# Patient Record
Sex: Female | Born: 1935 | Race: White | Hispanic: No | Smoking: Former smoker
Health system: Southern US, Community
[De-identification: ages and names within clinical notes are randomized; demographics above are authoritative.]

## PROBLEM LIST (undated history)

## (undated) DIAGNOSIS — E785 Hyperlipidemia, unspecified: Secondary | ICD-10-CM

## (undated) DIAGNOSIS — K5730 Diverticulosis of large intestine without perforation or abscess without bleeding: Secondary | ICD-10-CM

## (undated) DIAGNOSIS — D638 Anemia in other chronic diseases classified elsewhere: Secondary | ICD-10-CM

## (undated) DIAGNOSIS — M545 Low back pain, unspecified: Secondary | ICD-10-CM

## (undated) DIAGNOSIS — E1140 Type 2 diabetes mellitus with diabetic neuropathy, unspecified: Secondary | ICD-10-CM

## (undated) DIAGNOSIS — K8020 Calculus of gallbladder without cholecystitis without obstruction: Secondary | ICD-10-CM

## (undated) DIAGNOSIS — R51 Headache: Secondary | ICD-10-CM

## (undated) DIAGNOSIS — E039 Hypothyroidism, unspecified: Secondary | ICD-10-CM

## (undated) DIAGNOSIS — E538 Deficiency of other specified B group vitamins: Secondary | ICD-10-CM

## (undated) DIAGNOSIS — N259 Disorder resulting from impaired renal tubular function, unspecified: Secondary | ICD-10-CM

## (undated) DIAGNOSIS — J209 Acute bronchitis, unspecified: Secondary | ICD-10-CM

## (undated) DIAGNOSIS — K219 Gastro-esophageal reflux disease without esophagitis: Secondary | ICD-10-CM

## (undated) DIAGNOSIS — F411 Generalized anxiety disorder: Secondary | ICD-10-CM

## (undated) DIAGNOSIS — M159 Polyosteoarthritis, unspecified: Secondary | ICD-10-CM

## (undated) DIAGNOSIS — I10 Essential (primary) hypertension: Secondary | ICD-10-CM

## (undated) DIAGNOSIS — R079 Chest pain, unspecified: Secondary | ICD-10-CM

## (undated) DIAGNOSIS — E119 Type 2 diabetes mellitus without complications: Secondary | ICD-10-CM

## (undated) HISTORY — DX: Gastro-esophageal reflux disease without esophagitis: K21.9

## (undated) HISTORY — DX: Chest pain, unspecified: R07.9

## (undated) HISTORY — DX: Calculus of gallbladder without cholecystitis without obstruction: K80.20

## (undated) HISTORY — DX: Diverticulosis of large intestine without perforation or abscess without bleeding: K57.30

## (undated) HISTORY — DX: Deficiency of other specified B group vitamins: E53.8

## (undated) HISTORY — DX: Polyosteoarthritis, unspecified: M15.9

## (undated) HISTORY — DX: Headache: R51

## (undated) HISTORY — DX: Hyperlipidemia, unspecified: E78.5

## (undated) HISTORY — DX: Low back pain: M54.5

## (undated) HISTORY — PX: APPENDECTOMY: SHX54

## (undated) HISTORY — DX: Type 2 diabetes mellitus without complications: E11.9

## (undated) HISTORY — DX: Low back pain, unspecified: M54.50

## (undated) HISTORY — DX: Essential (primary) hypertension: I10

## (undated) HISTORY — DX: Hypothyroidism, unspecified: E03.9

## (undated) HISTORY — DX: Generalized anxiety disorder: F41.1

## (undated) HISTORY — DX: Anemia in other chronic diseases classified elsewhere: D63.8

## (undated) HISTORY — DX: Disorder resulting from impaired renal tubular function, unspecified: N25.9

## (undated) HISTORY — DX: Acute bronchitis, unspecified: J20.9

## (undated) HISTORY — PX: THYROIDECTOMY: SHX17

---

## 1997-05-23 ENCOUNTER — Encounter: Admission: RE | Admit: 1997-05-23 | Discharge: 1997-08-21 | Payer: Self-pay | Admitting: *Deleted

## 1998-03-22 ENCOUNTER — Emergency Department (HOSPITAL_COMMUNITY): Admission: EM | Admit: 1998-03-22 | Discharge: 1998-03-22 | Payer: Self-pay | Admitting: Emergency Medicine

## 1998-03-22 ENCOUNTER — Encounter: Payer: Self-pay | Admitting: Emergency Medicine

## 1998-04-13 ENCOUNTER — Other Ambulatory Visit: Admission: RE | Admit: 1998-04-13 | Discharge: 1998-04-13 | Payer: Self-pay | Admitting: *Deleted

## 1998-06-26 ENCOUNTER — Ambulatory Visit (HOSPITAL_COMMUNITY): Admission: RE | Admit: 1998-06-26 | Discharge: 1998-06-26 | Payer: Self-pay | Admitting: Internal Medicine

## 1999-06-05 ENCOUNTER — Other Ambulatory Visit: Admission: RE | Admit: 1999-06-05 | Discharge: 1999-06-05 | Payer: Self-pay | Admitting: *Deleted

## 2000-11-27 ENCOUNTER — Ambulatory Visit (HOSPITAL_COMMUNITY): Admission: RE | Admit: 2000-11-27 | Discharge: 2000-11-27 | Payer: Self-pay | Admitting: Pulmonary Disease

## 2000-11-27 ENCOUNTER — Encounter: Payer: Self-pay | Admitting: Pulmonary Disease

## 2000-12-17 ENCOUNTER — Encounter: Payer: Self-pay | Admitting: Rheumatology

## 2000-12-17 ENCOUNTER — Encounter: Admission: RE | Admit: 2000-12-17 | Discharge: 2000-12-17 | Payer: Self-pay | Admitting: Rheumatology

## 2001-01-20 ENCOUNTER — Encounter: Admission: RE | Admit: 2001-01-20 | Discharge: 2001-02-17 | Payer: Self-pay | Admitting: Rheumatology

## 2004-03-16 ENCOUNTER — Emergency Department (HOSPITAL_COMMUNITY): Admission: EM | Admit: 2004-03-16 | Discharge: 2004-03-17 | Payer: Self-pay | Admitting: Emergency Medicine

## 2004-04-02 ENCOUNTER — Ambulatory Visit: Payer: Self-pay | Admitting: Pulmonary Disease

## 2004-04-03 ENCOUNTER — Ambulatory Visit: Payer: Self-pay | Admitting: Family Medicine

## 2004-04-10 ENCOUNTER — Ambulatory Visit: Payer: Self-pay | Admitting: Pulmonary Disease

## 2004-04-19 ENCOUNTER — Ambulatory Visit: Payer: Self-pay | Admitting: Pulmonary Disease

## 2004-04-19 ENCOUNTER — Ambulatory Visit: Payer: Self-pay | Admitting: Internal Medicine

## 2004-05-03 ENCOUNTER — Ambulatory Visit: Payer: Self-pay | Admitting: Internal Medicine

## 2004-05-15 ENCOUNTER — Ambulatory Visit: Payer: Self-pay | Admitting: Pulmonary Disease

## 2004-05-29 ENCOUNTER — Ambulatory Visit: Payer: Self-pay | Admitting: Pulmonary Disease

## 2004-07-01 ENCOUNTER — Ambulatory Visit: Payer: Self-pay | Admitting: Pulmonary Disease

## 2004-09-17 ENCOUNTER — Ambulatory Visit: Payer: Self-pay | Admitting: Pulmonary Disease

## 2004-12-18 ENCOUNTER — Ambulatory Visit: Payer: Self-pay | Admitting: Pulmonary Disease

## 2005-04-11 ENCOUNTER — Ambulatory Visit: Payer: Self-pay | Admitting: Pulmonary Disease

## 2005-04-28 ENCOUNTER — Ambulatory Visit: Payer: Self-pay | Admitting: Cardiology

## 2005-05-09 ENCOUNTER — Ambulatory Visit: Payer: Self-pay

## 2005-05-14 ENCOUNTER — Ambulatory Visit: Payer: Self-pay | Admitting: Cardiology

## 2005-06-05 ENCOUNTER — Encounter: Admission: RE | Admit: 2005-06-05 | Discharge: 2005-06-20 | Payer: Self-pay | Admitting: Physician Assistant

## 2005-06-25 ENCOUNTER — Ambulatory Visit: Payer: Self-pay | Admitting: Pulmonary Disease

## 2005-08-11 ENCOUNTER — Ambulatory Visit: Payer: Self-pay | Admitting: Pulmonary Disease

## 2005-11-11 ENCOUNTER — Ambulatory Visit: Payer: Self-pay | Admitting: Pulmonary Disease

## 2005-11-27 ENCOUNTER — Ambulatory Visit: Payer: Self-pay | Admitting: Pulmonary Disease

## 2005-11-27 LAB — CONVERTED CEMR LAB
ALT: 15 units/L (ref 0–40)
AST: 22 units/L (ref 0–37)
Albumin: 4 g/dL (ref 3.5–5.2)
Alkaline Phosphatase: 43 units/L (ref 39–117)
Basophils Relative: 0.6 % (ref 0.0–1.0)
Chol/HDL Ratio, serum: 9.1
Cholesterol: 182 mg/dL (ref 0–200)
Eosinophil percent: 3.2 % (ref 0.0–5.0)
GFR calc non Af Amer: 26 mL/min
Glomerular Filtration Rate, Af Am: 31 mL/min/{1.73_m2}
Glucose, Bld: 59 mg/dL — ABNORMAL LOW (ref 70–99)
HCT: 32.4 % — ABNORMAL LOW (ref 36.0–46.0)
Hgb A1c MFr Bld: 7.3 % — ABNORMAL HIGH (ref 4.6–6.0)
MCV: 91.1 fL (ref 78.0–100.0)
Monocytes Absolute: 0.5 10*3/uL (ref 0.2–0.7)
Neutro Abs: 2.3 10*3/uL (ref 1.4–7.7)
Platelets: 244 10*3/uL (ref 150–400)
RBC: 3.56 M/uL — ABNORMAL LOW (ref 3.87–5.11)
RDW: 13.2 % (ref 11.5–14.6)
Sodium: 142 meq/L (ref 135–145)
Total Bilirubin: 0.7 mg/dL (ref 0.3–1.2)
VLDL: 58 mg/dL — ABNORMAL HIGH (ref 0–40)
WBC: 4.5 10*3/uL (ref 4.5–10.5)

## 2006-02-16 ENCOUNTER — Ambulatory Visit: Payer: Self-pay | Admitting: Pulmonary Disease

## 2006-02-16 ENCOUNTER — Observation Stay (HOSPITAL_COMMUNITY): Admission: EM | Admit: 2006-02-16 | Discharge: 2006-02-19 | Payer: Self-pay | Admitting: Emergency Medicine

## 2006-03-04 ENCOUNTER — Ambulatory Visit: Payer: Self-pay | Admitting: Pulmonary Disease

## 2006-03-04 LAB — CONVERTED CEMR LAB
Albumin: 4.3 g/dL (ref 3.5–5.2)
Alkaline Phosphatase: 36 units/L — ABNORMAL LOW (ref 39–117)
BUN: 37 mg/dL — ABNORMAL HIGH (ref 6–23)
Basophils Absolute: 0.1 10*3/uL (ref 0.0–0.1)
CO2: 23 meq/L (ref 19–32)
Calcium: 9.8 mg/dL (ref 8.4–10.5)
Creatinine, Ser: 1.8 mg/dL — ABNORMAL HIGH (ref 0.4–1.2)
Eosinophils Absolute: 0.1 10*3/uL (ref 0.0–0.6)
Glucose, Bld: 137 mg/dL — ABNORMAL HIGH (ref 70–99)
Hemoglobin: 11.1 g/dL — ABNORMAL LOW (ref 12.0–15.0)
Lymphocytes Relative: 22.9 % (ref 12.0–46.0)
MCHC: 34.6 g/dL (ref 30.0–36.0)
Monocytes Absolute: 0.7 10*3/uL (ref 0.2–0.7)
Monocytes Relative: 10.6 % (ref 3.0–11.0)
Neutro Abs: 4.3 10*3/uL (ref 1.4–7.7)
Neutrophils Relative %: 63.5 % (ref 43.0–77.0)
Platelets: 341 10*3/uL (ref 150–400)
TSH: 0.41 microintl units/mL (ref 0.35–5.50)

## 2006-04-13 ENCOUNTER — Ambulatory Visit: Payer: Self-pay | Admitting: Pulmonary Disease

## 2006-06-24 ENCOUNTER — Ambulatory Visit: Payer: Self-pay | Admitting: Pulmonary Disease

## 2006-06-24 LAB — CONVERTED CEMR LAB
CO2: 25 meq/L (ref 19–32)
Chloride: 113 meq/L — ABNORMAL HIGH (ref 96–112)
Creatinine, Ser: 1.6 mg/dL — ABNORMAL HIGH (ref 0.4–1.2)
GFR calc non Af Amer: 34 mL/min
Glucose, Bld: 75 mg/dL (ref 70–99)
Sodium: 143 meq/L (ref 135–145)

## 2006-07-17 ENCOUNTER — Ambulatory Visit: Payer: Self-pay | Admitting: Pulmonary Disease

## 2006-09-22 ENCOUNTER — Ambulatory Visit: Payer: Self-pay | Admitting: Pulmonary Disease

## 2006-09-23 ENCOUNTER — Ambulatory Visit: Payer: Self-pay | Admitting: Pulmonary Disease

## 2006-09-23 LAB — CONVERTED CEMR LAB
BUN: 26 mg/dL — ABNORMAL HIGH (ref 6–23)
Basophils Relative: 0.9 % (ref 0.0–1.0)
CO2: 26 meq/L (ref 19–32)
Calcium: 9.7 mg/dL (ref 8.4–10.5)
Eosinophils Absolute: 0.1 10*3/uL (ref 0.0–0.6)
GFR calc Af Amer: 44 mL/min
GFR calc non Af Amer: 36 mL/min
HDL: 16.9 mg/dL — ABNORMAL LOW (ref >39.0)
LDL Cholesterol: 73 mg/dL (ref 0–99)
Lymphocytes Relative: 29.2 % (ref 12.0–46.0)
Monocytes Relative: 14 % — ABNORMAL HIGH (ref 3.0–11.0)
Neutro Abs: 2.5 10*3/uL (ref 1.4–7.7)
Platelets: 281 10*3/uL (ref 150–400)
RBC: 3.18 M/uL — ABNORMAL LOW (ref 3.87–5.11)
Total CHOL/HDL Ratio: 7.6
Triglycerides: 197 mg/dL — ABNORMAL HIGH (ref 0–149)
VLDL: 39 mg/dL (ref 0–40)

## 2006-11-21 DIAGNOSIS — K219 Gastro-esophageal reflux disease without esophagitis: Secondary | ICD-10-CM | POA: Insufficient documentation

## 2006-11-21 DIAGNOSIS — R519 Headache, unspecified: Secondary | ICD-10-CM | POA: Insufficient documentation

## 2006-11-21 DIAGNOSIS — G8929 Other chronic pain: Secondary | ICD-10-CM

## 2006-11-21 DIAGNOSIS — M545 Low back pain: Secondary | ICD-10-CM

## 2006-11-21 DIAGNOSIS — E782 Mixed hyperlipidemia: Secondary | ICD-10-CM | POA: Insufficient documentation

## 2006-11-21 DIAGNOSIS — I10 Essential (primary) hypertension: Secondary | ICD-10-CM

## 2006-11-21 DIAGNOSIS — M159 Polyosteoarthritis, unspecified: Secondary | ICD-10-CM

## 2006-11-21 DIAGNOSIS — M810 Age-related osteoporosis without current pathological fracture: Secondary | ICD-10-CM

## 2006-11-21 DIAGNOSIS — E039 Hypothyroidism, unspecified: Secondary | ICD-10-CM

## 2006-11-21 DIAGNOSIS — R51 Headache: Secondary | ICD-10-CM

## 2006-11-21 DIAGNOSIS — E785 Hyperlipidemia, unspecified: Secondary | ICD-10-CM

## 2007-01-21 ENCOUNTER — Ambulatory Visit: Payer: Self-pay | Admitting: Pulmonary Disease

## 2007-01-21 DIAGNOSIS — F411 Generalized anxiety disorder: Secondary | ICD-10-CM | POA: Insufficient documentation

## 2007-01-21 DIAGNOSIS — N184 Chronic kidney disease, stage 4 (severe): Secondary | ICD-10-CM | POA: Insufficient documentation

## 2007-01-21 DIAGNOSIS — N183 Chronic kidney disease, stage 3 (moderate): Secondary | ICD-10-CM

## 2007-01-21 DIAGNOSIS — K5730 Diverticulosis of large intestine without perforation or abscess without bleeding: Secondary | ICD-10-CM | POA: Insufficient documentation

## 2007-01-21 DIAGNOSIS — D638 Anemia in other chronic diseases classified elsewhere: Secondary | ICD-10-CM | POA: Insufficient documentation

## 2007-03-08 ENCOUNTER — Telehealth: Payer: Self-pay | Admitting: Pulmonary Disease

## 2007-05-24 ENCOUNTER — Ambulatory Visit: Payer: Self-pay | Admitting: Pulmonary Disease

## 2007-05-24 ENCOUNTER — Encounter: Payer: Self-pay | Admitting: Adult Health

## 2007-06-04 ENCOUNTER — Telehealth: Payer: Self-pay | Admitting: Pulmonary Disease

## 2007-06-05 LAB — CONVERTED CEMR LAB: Vit D, 1,25-Dihydroxy: 24 — ABNORMAL LOW (ref 30–89)

## 2007-06-06 LAB — CONVERTED CEMR LAB
ALT: 14 units/L (ref 0–35)
Calcium: 10.1 mg/dL (ref 8.4–10.5)
Cholesterol: 127 mg/dL (ref 0–200)
Creatinine, Ser: 1.5 mg/dL — ABNORMAL HIGH (ref 0.4–1.2)
Direct LDL: 69.3 mg/dL
Eosinophils Absolute: 0.2 10*3/uL (ref 0.0–0.7)
Folate: 9.6 ng/mL
GFR calc non Af Amer: 36 mL/min
HCT: 28.7 % — ABNORMAL LOW (ref 36.0–46.0)
Hgb A1c MFr Bld: 5.5 % (ref 4.6–6.0)
Iron: 102 ug/dL (ref 42–145)
MCV: 90.3 fL (ref 78.0–100.0)
Monocytes Absolute: 0.5 10*3/uL (ref 0.1–1.0)
Platelets: 250 10*3/uL (ref 150–400)
RDW: 14.6 % (ref 11.5–14.6)
Total Bilirubin: 0.9 mg/dL (ref 0.3–1.2)
Transferrin: 343.7 mg/dL (ref >=212.0)
Triglycerides: 269 mg/dL (ref 0–149)
Vitamin B-12: >1500 pg/mL — ABNORMAL HIGH (ref 211–911)

## 2007-06-08 ENCOUNTER — Telehealth: Payer: Self-pay | Admitting: Pulmonary Disease

## 2007-06-15 ENCOUNTER — Telehealth: Payer: Self-pay | Admitting: Pulmonary Disease

## 2007-11-23 ENCOUNTER — Ambulatory Visit: Payer: Self-pay | Admitting: Pulmonary Disease

## 2007-11-23 DIAGNOSIS — E538 Deficiency of other specified B group vitamins: Secondary | ICD-10-CM | POA: Insufficient documentation

## 2007-11-25 ENCOUNTER — Ambulatory Visit: Payer: Self-pay | Admitting: Pulmonary Disease

## 2007-11-26 ENCOUNTER — Encounter: Payer: Self-pay | Admitting: Pulmonary Disease

## 2007-11-26 LAB — CONVERTED CEMR LAB
ALT: 16 units/L (ref 0–35)
AST: 25 units/L (ref 0–37)
Albumin: 3.7 g/dL (ref 3.5–5.2)
Alkaline Phosphatase: 31 units/L — ABNORMAL LOW (ref 39–117)
BUN: 31 mg/dL — ABNORMAL HIGH (ref 6–23)
CO2: 24 meq/L (ref 19–32)
Chloride: 110 meq/L (ref 96–112)
Eosinophils Relative: 5.1 % — ABNORMAL HIGH (ref 0.0–5.0)
Glucose, Bld: 92 mg/dL (ref 70–99)
HCT: 26.7 % — ABNORMAL LOW (ref 36.0–46.0)
Lymphocytes Relative: 34.1 % (ref 12.0–46.0)
Monocytes Absolute: 0.7 10*3/uL (ref 0.1–1.0)
Monocytes Relative: 18.4 % — ABNORMAL HIGH (ref 3.0–12.0)
Neutrophils Relative %: 40.9 % — ABNORMAL LOW (ref 43.0–77.0)
Platelets: 211 10*3/uL (ref 150–400)
Potassium: 5.4 meq/L — ABNORMAL HIGH (ref 3.5–5.1)
Total CHOL/HDL Ratio: 12
Total Protein: 6.8 g/dL (ref 6.0–8.3)
Triglycerides: 247 mg/dL (ref 0–149)
WBC: 3.7 10*3/uL — ABNORMAL LOW (ref 4.5–10.5)

## 2007-12-28 ENCOUNTER — Telehealth (INDEPENDENT_AMBULATORY_CARE_PROVIDER_SITE_OTHER): Payer: Self-pay | Admitting: *Deleted

## 2008-01-31 ENCOUNTER — Telehealth: Payer: Self-pay | Admitting: Pulmonary Disease

## 2008-02-18 ENCOUNTER — Ambulatory Visit: Payer: Self-pay | Admitting: Internal Medicine

## 2008-02-21 ENCOUNTER — Encounter: Payer: Self-pay | Admitting: Pulmonary Disease

## 2008-02-22 ENCOUNTER — Telehealth (INDEPENDENT_AMBULATORY_CARE_PROVIDER_SITE_OTHER): Payer: Self-pay | Admitting: *Deleted

## 2008-02-22 LAB — CONVERTED CEMR LAB
CO2: 26 meq/L (ref 19–32)
Calcium: 9.4 mg/dL (ref 8.4–10.5)
Chloride: 108 meq/L (ref 96–112)
Creatinine, Ser: 0.9 mg/dL (ref 0.4–1.2)
Glucose, Bld: 115 mg/dL — ABNORMAL HIGH (ref 70–99)

## 2008-03-06 ENCOUNTER — Ambulatory Visit: Payer: Self-pay | Admitting: Pulmonary Disease

## 2008-03-24 ENCOUNTER — Ambulatory Visit: Payer: Self-pay | Admitting: Pulmonary Disease

## 2008-05-23 ENCOUNTER — Ambulatory Visit: Payer: Self-pay | Admitting: Pulmonary Disease

## 2008-05-28 LAB — CONVERTED CEMR LAB
ALT: 15 units/L (ref 0–35)
AST: 23 units/L (ref 0–37)
Basophils Relative: 0.6 % (ref 0.0–3.0)
Bilirubin, Direct: 0.1 mg/dL (ref 0.0–0.3)
Chloride: 110 meq/L (ref 96–112)
Creatinine, Ser: 1 mg/dL (ref 0.4–1.2)
Creatinine,U: 151.9 mg/dL
Eosinophils Relative: 3.1 % (ref 0.0–5.0)
GFR calc non Af Amer: 57.38 mL/min (ref >60)
HCT: 32.6 % — ABNORMAL LOW (ref 36.0–46.0)
HDL: 38.2 mg/dL — ABNORMAL LOW (ref >39.00)
Hgb A1c MFr Bld: 7.1 % — ABNORMAL HIGH (ref 4.6–6.5)
MCV: 88.4 fL (ref 78.0–100.0)
Microalb Creat Ratio: 458.2 mg/g — ABNORMAL HIGH (ref 0.0–30.0)
Microalb, Ur: 69.6 mg/dL — ABNORMAL HIGH (ref 0.0–1.9)
Monocytes Absolute: 0.8 10*3/uL (ref 0.1–1.0)
Monocytes Relative: 12 % (ref 3.0–12.0)
Neutrophils Relative %: 59.3 % (ref 43.0–77.0)
Platelets: 241 10*3/uL (ref 150.0–400.0)
Potassium: 4.6 meq/L (ref 3.5–5.1)
RBC: 3.68 M/uL — ABNORMAL LOW (ref 3.87–5.11)
Saturation Ratios: 19.3 % — ABNORMAL LOW (ref 20.0–50.0)
Total Bilirubin: 0.7 mg/dL (ref 0.3–1.2)
Total CHOL/HDL Ratio: 5
Total Protein: 7.4 g/dL (ref 6.0–8.3)
Transferrin: 262.5 mg/dL (ref 212.0–360.0)
VLDL: 47.8 mg/dL — ABNORMAL HIGH (ref 0.0–40.0)
Vitamin B-12: >1500 pg/mL — ABNORMAL HIGH (ref 211–911)
WBC: 6.7 10*3/uL (ref 4.5–10.5)

## 2008-05-31 ENCOUNTER — Telehealth (INDEPENDENT_AMBULATORY_CARE_PROVIDER_SITE_OTHER): Payer: Self-pay | Admitting: *Deleted

## 2008-10-26 ENCOUNTER — Encounter: Payer: Self-pay | Admitting: Pulmonary Disease

## 2008-10-27 ENCOUNTER — Telehealth: Payer: Self-pay | Admitting: Pulmonary Disease

## 2008-10-30 ENCOUNTER — Ambulatory Visit: Payer: Self-pay | Admitting: Pulmonary Disease

## 2008-10-30 DIAGNOSIS — S139XXA Sprain of joints and ligaments of unspecified parts of neck, initial encounter: Secondary | ICD-10-CM

## 2008-11-08 ENCOUNTER — Encounter: Admission: RE | Admit: 2008-11-08 | Discharge: 2008-11-08 | Payer: Self-pay | Admitting: Neurological Surgery

## 2008-11-21 ENCOUNTER — Encounter: Payer: Self-pay | Admitting: Pulmonary Disease

## 2008-11-22 ENCOUNTER — Ambulatory Visit: Payer: Self-pay | Admitting: Pulmonary Disease

## 2008-12-02 LAB — CONVERTED CEMR LAB
ALT: 14 units/L (ref 0–35)
Albumin: 3.9 g/dL (ref 3.5–5.2)
Alkaline Phosphatase: 39 units/L (ref 39–117)
Basophils Relative: 2 % (ref 0.0–3.0)
Bilirubin, Direct: 0.3 mg/dL (ref 0.0–0.3)
CO2: 25 meq/L (ref 19–32)
Chloride: 108 meq/L (ref 96–112)
Cholesterol: 134 mg/dL (ref 0–200)
Creatinine, Ser: 1.2 mg/dL (ref 0.4–1.2)
Eosinophils Relative: 3.9 % (ref 0.0–5.0)
Hemoglobin: 9.3 g/dL — ABNORMAL LOW (ref 12.0–15.0)
Hgb A1c MFr Bld: 4.8 % (ref 4.6–6.5)
MCHC: 34.9 g/dL (ref 30.0–36.0)
MCV: 92.9 fL (ref 78.0–100.0)
Monocytes Absolute: 0.6 10*3/uL (ref 0.1–1.0)
Neutro Abs: 2 10*3/uL (ref 1.4–7.7)
Neutrophils Relative %: 50 % (ref 43.0–77.0)
Potassium: 4.3 meq/L (ref 3.5–5.1)
RBC: 2.86 M/uL — ABNORMAL LOW (ref 3.87–5.11)
Sodium: 142 meq/L (ref 135–145)
Total CHOL/HDL Ratio: 13
Total Protein: 6.9 g/dL (ref 6.0–8.3)
VLDL: 51.2 mg/dL — ABNORMAL HIGH (ref 0.0–40.0)
WBC: 4.1 10*3/uL — ABNORMAL LOW (ref 4.5–10.5)

## 2009-02-20 ENCOUNTER — Telehealth (INDEPENDENT_AMBULATORY_CARE_PROVIDER_SITE_OTHER): Payer: Self-pay | Admitting: *Deleted

## 2009-04-24 ENCOUNTER — Telehealth (INDEPENDENT_AMBULATORY_CARE_PROVIDER_SITE_OTHER): Payer: Self-pay | Admitting: *Deleted

## 2009-05-14 ENCOUNTER — Ambulatory Visit: Payer: Self-pay | Admitting: Pulmonary Disease

## 2009-05-14 LAB — CONVERTED CEMR LAB: HDL goal, serum: 40 mg/dL

## 2009-05-20 LAB — CONVERTED CEMR LAB
BUN: 20 mg/dL (ref 6–23)
Basophils Relative: 1.1 % (ref 0.0–3.0)
Bilirubin, Direct: 0.2 mg/dL (ref 0.0–0.3)
Cholesterol: 163 mg/dL (ref 0–200)
Creatinine, Ser: 1.5 mg/dL — ABNORMAL HIGH (ref 0.4–1.2)
Direct LDL: 108.1 mg/dL
Eosinophils Relative: 2.4 % (ref 0.0–5.0)
GFR calc non Af Amer: 35.84 mL/min (ref >60)
HCT: 34 % — ABNORMAL LOW (ref 36.0–46.0)
HDL: 27.6 mg/dL — ABNORMAL LOW (ref >39.00)
Hemoglobin: 11.8 g/dL — ABNORMAL LOW (ref 12.0–15.0)
Hgb A1c MFr Bld: 9.1 % — ABNORMAL HIGH (ref 4.6–6.5)
Iron: 86 ug/dL (ref 42–145)
Lymphs Abs: 1.3 10*3/uL (ref 0.7–4.0)
MCV: 86.9 fL (ref 78.0–100.0)
Monocytes Absolute: 0.6 10*3/uL (ref 0.1–1.0)
Neutro Abs: 4.7 10*3/uL (ref 1.4–7.7)
RBC: 3.91 M/uL (ref 3.87–5.11)
Total Bilirubin: 0.7 mg/dL (ref 0.3–1.2)
Total CHOL/HDL Ratio: 6
VLDL: 46.8 mg/dL — ABNORMAL HIGH (ref 0.0–40.0)
WBC: 6.8 10*3/uL (ref 4.5–10.5)

## 2009-05-22 LAB — CONVERTED CEMR LAB: Vit D, 25-Hydroxy: 22 ng/mL — ABNORMAL LOW (ref 30–89)

## 2009-08-07 ENCOUNTER — Ambulatory Visit: Payer: Self-pay | Admitting: Pulmonary Disease

## 2009-08-10 LAB — CONVERTED CEMR LAB
Calcium: 9.7 mg/dL (ref 8.4–10.5)
GFR calc non Af Amer: 41.88 mL/min (ref >60)
Glucose, Bld: 195 mg/dL — ABNORMAL HIGH (ref 70–99)
Sodium: 142 meq/L (ref 135–145)

## 2009-11-12 ENCOUNTER — Ambulatory Visit: Payer: Self-pay | Admitting: Pulmonary Disease

## 2009-11-19 LAB — CONVERTED CEMR LAB
AST: 22 units/L (ref 0–37)
BUN: 27 mg/dL — ABNORMAL HIGH (ref 6–23)
Basophils Relative: 1.2 % (ref 0.0–3.0)
Creatinine, Ser: 1.5 mg/dL — ABNORMAL HIGH (ref 0.4–1.2)
Eosinophils Relative: 5.3 % — ABNORMAL HIGH (ref 0.0–5.0)
GFR calc non Af Amer: 36.07 mL/min (ref >60)
HCT: 32.5 % — ABNORMAL LOW (ref 36.0–46.0)
Hgb A1c MFr Bld: 7.9 % — ABNORMAL HIGH (ref 4.6–6.5)
Lymphs Abs: 1.3 10*3/uL (ref 0.7–4.0)
MCV: 89.3 fL (ref 78.0–100.0)
Monocytes Absolute: 0.7 10*3/uL (ref 0.1–1.0)
Neutro Abs: 3.5 10*3/uL (ref 1.4–7.7)
Potassium: 4.8 meq/L (ref 3.5–5.1)
RBC: 3.64 M/uL — ABNORMAL LOW (ref 3.87–5.11)
Total Bilirubin: 0.7 mg/dL (ref 0.3–1.2)
Total CHOL/HDL Ratio: 7
WBC: 6 10*3/uL (ref 4.5–10.5)

## 2010-03-12 NOTE — Progress Notes (Signed)
Summary: Records request from Gwynn Burly. Burnard Leigh at [facility] for records received from [employer]. Donalda Ewings, Forensic psychologist at [facility]. Forwarded to [employer] for signature.Valda Favia  April 24, 2009 12:32 PM

## 2010-03-12 NOTE — Assessment & Plan Note (Signed)
Summary: 3 month follow up --recheck bs/la   CC:  3 month ROV & review....  History of Present Illness: 77 y/o WF here for a follow up visit... she has mult med problems as noted below...  She is followed for HBP, Hypercholesterolemia, DM, mild renal insuffic, & multifactorial anemia w/ B12 defic on shots monthly... she also has DJD, LBP, Osteopenia, etc...   ~  May 14, 2009:  she reports a good 61mo- just c/o some intermittent sharp CWP (takes Tylenol/ ASA)... here for f/u BP- better control on Diovan320;  DM on Metformin 500Bid alone (prev Avandia stopped);  Chol on Lip20 & Fenofib160; etc> SEE BELOW.   ~  August 07, 2009:  she hasn't been checking BS since her little dog, Tempie Donning, ate her meter (we gave her a new one today)... last OV BS=266, A1c=9.1 on Metform500/d & we incr to Bid +added Glimep1mg /d... labs today show BS=195, A1c=7.9 & we will incr the glimep to 2mg /d> stressed diet + exercise... her CC is left ear pain> exam neg & we will Rx w/ Auralgan- to ENT if symptom persists... BP controlled on Rx;  TFTs OK on Synthroid112;  renal funct stable w/ Creat=1.3;  she remains on B12 shots monthly for PA.    Current Problem List:  Hx of ASTHMATIC BRONCHITIS, ACUTE (ICD-466.0) - no recent bronchitic episodes... the increased activity has really helped her...  HYPERTENSION (ICD-401.9) - controlled on DIOVAN/Hct 320-12.5 daily (incr to this dose 10/10) + low sodium diet... BP=138/60 today, weight 157#... denies visual changes, CP, palipit, dizziness, syncope, dyspnea, edema, etc... discussed diet & exercise & continue same med.  Hx of CHEST PAIN (ICD-786.50) - on ASA 81mg /d... 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  ~  cath 1988 showed normal coronaries, min MVP...  DM (ICD-250.00) - on METFORMIN 500mg Bid + GLIMEPIRIDE 1mg /d... she was last adm to the hosp 1/08 w/ hypoglycemia due to meds and renal insuffic... not checking BS at home Tempie Donning ate my meter")  & she was given new meter...  ~  labs 8/08 showed BS= 113, HgA1c=5.4.Marland KitchenMarland Kitchen  ~  labs 4/09 showed BS= 99, HgA1c= 5.5.Marland KitchenMarland Kitchen  ~  labs 10/09 showed BS= 92, HgA1c= 4.8.Marland Kitchen. rec> stop Glimepiride.  ~  labs 4/10 showed BS= 125, A1c= 7.1.Marland Kitchen. rec> better diet, same meds for now.  ~  labs 10/10 showed BS= 114, A1c= 4.8.Marland Kitchen. rec> stop the Avandia, continue Metformin.  ~  labs 4/11 showed BS= 266, A1c= 9.1.Marland KitchenMarland Kitchen what happened? Add Glimep1mg /d. ROV 36mo.  ~  labs 6/11 showed BS= 195, A1c= 7.9.Marland KitchenMarland Kitchen incr Glimep to 2mg /d.  HYPERLIPIDEMIA (ICD-272.4) - now on LIPITOR 20mg /d & FENOFIBRATE 160mg /d (ch from Lopid 10/10).  ~  FLP 8/08 showed TChol 129, TG 197, HDL 17, LDL 73... despite taking statin & fibrate drugs.  ~  FLP 4/09 on Lip20+Tric145 showed TChol 127, TG 269, HDL 13, LDL 69... rec> ch Tricor to [phone].  ~  Merriman 10/09 on Lip20+Trilip135 showed TChol 124, TG 247, HDL 10!, LDL 64... rec> ch Trilip to LopidBid.  ~  FLP 4/10 on Lip20+LopidBid showed TChol 174, TG 239, HDL 38, LDL 103... rec> ch Lopid to Feno160 (she never did).  ~  FLP 10/10 showed TChol 134, TG 256, HDL 10, LDL 76... change the Lopid to Fenofib160/d.  ~  FLP 4/11 on Lip20+Feno160 showed TChol 163, TG 234, HDL 28, LDL 108... keep same, better diet!  HYPOTHYROIDISM (ICD-244.9) - on SYNTHROID 147mcg/d...  ~  last TSH  1/08 = 0.41  ~  labs 4/09 showed TSH= 2.35  ~  labs 10/09 on Levothy125 showed TSH= 0.97  ~  labs 4/10 on Levothy125 showed TSH= 0.08... rec> decr [phone].  ~  labs 10/10 on Levothy112 showed TSH= 2.18... keep same.  ~  labs 4/11 on Levo112 showed TSH= 0.51  GERD (ICD-530.81) - takes NEXIUM 40mg  Prn (states Omep20 "makes me sick")...   ~  EGD was planned in 2006 eval but never done...  DIVERTICULOSIS OF COLON (ICD-562.10) - c/o constip & rec> Miralax + Senakot-S...  ~  last colonoscopy 3/06 by DrDBrodie showed divertics only...  RENAL INSUFFICIENCY (ICD-588.9) - prev Creat in the 1.7 - 2.0 range...  ~  labs 8/08 showed BUN=26,  Creat=1.5  ~  labs 4/09 showed BUN= 34, Creat= 1.5  ~  labs 10/09 showed BUN= 31, Creat= 1.5  ~  labs 4/10 showed BUN= 28, Creat= 1.0, Microalb= pos... continue Rx (on Diovan).  ~  labs 10/10 showed BUN= 22, Creat= 1.2  ~  labs 4/11 showed BUN= 20, Creat= 1.5  ~  labs 6/11 showed BUN= 20, Creat= 1.3  DEGENERATIVE JOINT DISEASE, GENERALIZED (ICD-715.00) - she uses Tylenol Prn. LOW BACK PAIN (ICD-724.2) OSTEOPOROSIS (ICD-733.00) - she takes calcium + vits daily...  ~  BMD here 2/06 showed TScores -1.4 Spine,  -1.1 left FemNeck...  ~  labs 4/09 showed Vit D level= 24... rec OTC Vit D 1000 u daily, but she stopped this.  ~  labs 4/10 showed Vit D level = 20... rec> incr OTC Vit D 2000 u daily (she never did).  ~  labs 4/11 showed Vit D level = 22...Marland KitchenMarland KitchenMarland Kitchen rec> incr to 2000 u daily.  HEADACHE (ICD-784.0)  ANXIETY (ICD-300.00) - she notes her Bro has emphysema, lung cancer and CHF... she takes ALPRAZOLAM 0.5mg  Prn.  ANEMIA OF CHRONIC DISEASE (ICD-285.29) - she has a multifactorial anemia w/ Hg= 11 in 2005, Fe was OK, Stools neg for blood, Folate norm, B12 found low, & Creat 1.5+... she take Fe Bid, w/ Vit C.  ~  labs 1/08 showed Hg=11.1  ~  labs 8/08 showed Hg=9.9  ~  labs 4/09 showed Hg= 9.6, Fe= 102, TIBC= 344, Sat= 21%  ~  labs 10/09 showed Hg= 9.1, MCV= 92  ~  labs 4/10 showed = 11.1, Fe= 71  ~  labs 10/10 showed Hg= 9.3, MCV= 93... rec Fe Bid w/ Vit C...  ~  labs 4/11 showed Hg= 11.8, MCV=87, Fe=86  VITAMIN B12 DEFICIENCY (ICD-266.2) - Vit B12 level was 77 in 2006 w/ pos parietal cell antibodies... she's been on B12 shots ever since then... f/u B12 level 4/10 = >1500...   Preventive Screening-Counseling & Management  Alcohol-Tobacco     Smoking Status: quit     Year Quit: 1975  Allergies: 1)  ! Quinine 2)  ! Benadryl 3)  ! Codeine 4)  ! Sulfa  Comments:  Nurse/Medical Assistant: The patient's medications and allergies were reviewed with the patient and were updated in the  Medication and Allergy Lists.  Past History:  Past Medical History: Hx of ASTHMATIC BRONCHITIS, ACUTE (ICD-466.0) HYPERTENSION (ICD-401.9) Hx of CHEST PAIN (ICD-786.50) DM (ICD-250.00) HYPERLIPIDEMIA (ICD-272.4) HYPOTHYROIDISM (ICD-244.9) GERD (ICD-530.81) DIVERTICULOSIS OF COLON (ICD-562.10) RENAL INSUFFICIENCY (ICD-588.9) DEGENERATIVE JOINT DISEASE, GENERALIZED (ICD-715.00) LOW BACK PAIN (ICD-724.2) OSTEOPOROSIS (ICD-733.00) HEADACHE (ICD-784.0) ANXIETY (ICD-300.00) ANEMIA OF CHRONIC DISEASE (ICD-285.29) VITAMIN B12 DEFICIENCY (ICD-266.2)  Past Surgical History: Appendectomy  Family History: Reviewed history and no changes required.  Social History:  Reviewed history from 05/23/2008 and no changes required. smoked about 35 yrs ago--2-3 cigs per day---quit in 1975--smoked for 5 yearsSmoking Status:  quit  Review of Systems      See HPI       The patient complains of decreased hearing and dyspnea on exertion.  The patient denies anorexia, fever, weight loss, weight gain, vision loss, hoarseness, chest pain, syncope, peripheral edema, prolonged cough, headaches, hemoptysis, abdominal pain, melena, hematochezia, severe indigestion/heartburn, hematuria, incontinence, muscle weakness, suspicious skin lesions, transient blindness, difficulty walking, depression, unusual weight change, abnormal bleeding, enlarged lymph nodes, and angioedema.    Vital Signs:  Patient profile:   77 year old female Height:      66 inches Weight:      157 pounds BMI:     25.43 O2 Sat:      98 % on Room air Temp:     99.6 degrees F oral Pulse rate:   84 / minute BP sitting:   138 / 60  (left arm) Cuff size:   regular  Vitals Entered By: Elita Boone CMA (August 07, 2009 11:29 AM)  O2 Sat at Rest %:  98 O2 Flow:  Room air CC: 3 month ROV & review... Is Patient Diabetic? Yes Pain Assessment Patient in pain? yes      Onset of pain  left ear pain x 1 day CBG Result 171 Comments no changes  in meds today   Physical Exam  Additional Exam:   WD, WN, 77 y/o WF in NAD... GENERAL:  Alert & oriented; pleasant & cooperative... HEENT:  [place]/AT, EOM-wnl, PERRLA, EACs-clear, TMs- sl red on left, NOSE-clear, THROAT-clear & wnl. NECK:  Decr ROM w/ mild spasm & tender; no JVD; normal carotid impulses w/o bruits; no thyromegaly or nodules palpated; no lymphadenopathy. CHEST:  Clear to P & A; without wheezes/ rales/ or rhonchi. HEART:  Regular Rhythm; without murmurs/ rubs/ or gallops. ABDOMEN:  Soft & nontender; normal bowel sounds; no organomegaly or masses detected. EXT: without deformities, mild arthritic changes; no varicose veins/ +venous insuffic/ no edema. NEURO:  CN's intact; no focal neuro deficits... DERM:  no lesions noted...    MISC. Report  Procedure date:  08/07/2009  Findings:      BMP (METABOL)   Sodium                    142 mEq/L                   135-145   Potassium                 4.4 mEq/L                   3.5-5.1   Chloride                  109 mEq/L                   96-112   Carbon Dioxide            26 mEq/L                    19-32   Glucose              [H]  195 mg/dL                   70-99   BUN  20 mg/dL                    6-23   Creatinine           [H]  1.3 mg/dL                   0.4-1.2   Calcium                   9.7 mg/dL                   8.4-10.5   GFR                       41.88 mL/min                >60   Hemoglobin A1C (A1C)   Hemoglobin A1C       [H]  7.9 %                       4.6-6.5   Impression & Recommendations:  Problem # 1:  EAR PAIN>>> Exam was neg> Rx w/ Auralgan and she is instructed to see ENT for further eval if symptoms persist...  Problem # 2:  DM (ICD-250.00) A1c improved to 7.9.Marland KitchenMarland Kitchen discussed diet/ exercise/ given new meter today... increase the Glimep to 2mg  Qam... Her updated medication list for this problem includes:    Adult Aspirin Low Strength 81 Mg Tbdp (Aspirin) .Marland Kitchen... Take 1 by mouth  daily    Diovan Hct 320-12.5 Mg Tabs (Valsartan-hydrochlorothiazide) .Marland Kitchen... Take 1 tab by mouth once daily...    Metformin Hcl 500 Mg Tabs (Metformin hcl) .Marland Kitchen... 1 tab by mouth twice a day    Amaryl 2 Mg Tabs (Glimepiride) .Marland Kitchen... Take 1 tab by mouth once daily in the am...  Orders: TLB-BMP (Basic Metabolic Panel-BMET) (99991111) TLB-A1C / Hgb A1C (Glycohemoglobin) (83036-A1C)  Problem # 3:  HYPERTENSION (ICD-401.9) Controlled>  same meds. Her updated medication list for this problem includes:    Diovan Hct 320-12.5 Mg Tabs (Valsartan-hydrochlorothiazide) .Marland Kitchen... Take 1 tab by mouth once daily...  Problem # 4:  HYPERLIPIDEMIA (P102836.4) Stable>  we stressed diet + exercise, continue same meds. Her updated medication list for this problem includes:    Lipitor 20 Mg Tabs (Atorvastatin calcium) .Marland Kitchen... 1 by mouth daily    Fenofibrate 160 Mg Tabs (Fenofibrate) .Marland Kitchen... Take 1 tablet by mouth once a day  Problem # 5:  HYPOTHYROIDISM (ICD-244.9) Stable>  continue same dose. Her updated medication list for this problem includes:    Synthroid 112 Mcg Tabs (Levothyroxine sodium) .Marland Kitchen... Take 1 tablet by mouth once a day  Problem # 6:  RENAL INSUFFICIENCY (ICD-588.9) Stable>  Creat= 1.3.Marland KitchenMarland Kitchen  Problem # 7:  OTHER MEDICAL PROBLEMS AS NOTED>>> Continue other Rx> B12, Fe, Nexium, etc...  Complete Medication List: 1)  Adult Aspirin Low Strength 81 Mg Tbdp (Aspirin) .... Take 1 by mouth daily 2)  Diovan Hct 320-12.5 Mg Tabs (Valsartan-hydrochlorothiazide) .... Take 1 tab by mouth once daily.Marland KitchenMarland Kitchen 3)  Lipitor 20 Mg Tabs (Atorvastatin calcium) .Marland Kitchen.. 1 by mouth daily 4)  Fenofibrate 160 Mg Tabs (Fenofibrate) .... Take 1 tablet by mouth once a day 5)  Metformin Hcl 500 Mg Tabs (Metformin hcl) .Marland Kitchen.. 1 tab by mouth twice a day 6)  Amaryl 2 Mg Tabs (Glimepiride) .... Take 1 tab by mouth once daily in the am... 7)  Synthroid 112 Mcg Tabs (Levothyroxine sodium) .Marland KitchenMarland KitchenMarland Kitchen  Take 1 tablet by mouth once a day 8)  Nexium 40 Mg  Cpdr (Esomeprazole magnesium) .Marland Kitchen.. 1 by mouth once daily 9)  Robaxin 500 Mg Tabs (Methocarbamol) .... Take 1 tab by mouth three times a day for muscle spasm... 10)  Caltrate 600+d Plus 600-400 Mg-unit Tabs (Calcium carbonate-vit d-min) .Marland Kitchen.. 1 tab twice daily 11)  Multivitamins Caps (Multiple vitamin) .Marland Kitchen.. 1 by mouth daily 12)  Cvs Iron 325 (65 Fe) Mg Tabs (Ferrous sulfate) .... Take 1 tablet by mouth daily... 13)  Vitamin D3 2000 Unit Caps (Cholecalciferol) .... Take one capsule by mouth once daily 14)  Cyanocobalamin 1000 Mcg/ml Inj Soln (Cyanocobalamin) .... Inject 1 vial every month 15)  Bayer Contour Test Strp (Glucose blood) .... Test blood sugars as directed 16)  Auralgan Soln (Antipyrine-benzocaine-polycos) .... 2 drops in affected ear up to three times daily as needed for ear pain...  Other Orders: Capillary Blood Glucose/CBG [id number])  Patient Instructions: 1)  Today we updated your med list- see below.... 2)  Continue your current meds the same for now... 3)  Today we rechecked your BS & A1c (long term sugar test)... please call the "phone tree" in a few days for your lab results.Marland KitchenMarland Kitchen 4)  Let's get on track w/ our diet> and increase your exercise program... 5)  Call for any questions.Marland KitchenMarland Kitchen 6)  Please schedule a follow-up appointment in 3-4 months. Prescriptions: AURALGAN  SOLN (ANTIPYRINE-BENZOCAINE-POLYCOS) 2 drops in affected ear up to three times daily as needed for ear pain...  #1 vial x 5   Entered and Authorized by:   Noralee Space MD   Signed by:   Noralee Space MD on 08/09/2009   Method used:   Print then Give to Patient   RxID:   [id number] AMARYL 2 MG TABS (GLIMEPIRIDE) take 1 tab by mouth once daily in the AM...  #30 x 11   Entered and Authorized by:   Noralee Space MD   Signed by:   Noralee Space MD on 08/09/2009   Method used:   Print then Give to Patient   RxID:   [id number] BAYER CONTOUR TEST  STRP (GLUCOSE BLOOD) test Blood sugars as directed  #50 x 6    Entered by:   Elita Boone CMA   Authorized by:   Noralee Space MD   Signed by:   Elita Boone CMA on 08/07/2009   Method used:   Electronically to        [facility] (retail)       [address]       [place], [place]  [id number]       Ph: [phone]       Fax: [phone]   RxID:   [id number]

## 2010-03-12 NOTE — Assessment & Plan Note (Signed)
Summary: 3 months/apc   CC:  3 month ROV & review of mult medical problems....  History of Present Illness: 77 y/o WF here for a follow up visit... she has mult med problems as noted below...  She is followed for HBP, Hypercholesterolemia, DM, mild renal insuffic, & multifactorial anemia w/ B12 defic on shots monthly... she also has DJD, LBP, Osteopenia, etc...   ~  May 14, 2009:  she reports a good 3mo- just c/o some intermittent sharp CWP (takes Tylenol/ ASA)... here for f/u BP- better control on Diovan320;  DM on Metformin 500Bid alone (prev Avandia stopped);  Chol on Lip20 & Fenofib160; etc> SEE BELOW.   ~  August 07, 2009:  she hasn't been checking BS since her little dog, Tempie Donning, ate her meter (we gave her a new one today)... last OV BS=266, A1c=9.1 on Metform500/d & we incr to Bid +added Glimep1mg /d... labs today show BS=195, A1c=7.9 & we will incr the glimep to 2mg /d> stressed diet + exercise... her CC is left ear pain> exam neg & we will Rx w/ Auralgan- to ENT if symptom persists... BP controlled on Rx;  TFTs OK on Synthroid112;  renal funct stable w/ Creat=1.3;  she remains on B12 shots monthly for PA.   ~  November 12, 2009:  states BS improving 115-130 range at home but FBS today 175 & A1c still 7.9 on MetformBid & Glimep2mg  (states she's taking meds regularly)... FLP fair on Lip20 + Feno160, needs better low chol/ low fat diet... BP controlled & otherw feeling OK w/o CP, palpit, SOB, edema, etc... we reviewed the importance of diet + exercise, & will incr the Glimep to 4mg /d...    Current Problem List:  Hx of ASTHMATIC BRONCHITIS, ACUTE (ICD-466.0) - no recent bronchitic episodes... the increased activity has really helped her...  HYPERTENSION (ICD-401.9) - controlled on DIOVAN/Hct 320-12.5 daily (incr to this dose 10/10) + low sodium diet... BP=140/76 today, weight 154# (down 3#)... denies visual changes, CP, palipit, dizziness, syncope, dyspnea, edema, etc... discussed diet &  exercise & continue same med.  Hx of CHEST PAIN (ICD-786.50) - on ASA 81mg /d... 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  ~  cath 1988 showed normal coronaries, min MVP...  DM (ICD-250.00) - on METFORMIN 500mg Bid + GLIMEPIRIDE 2mg /d... she was last adm to the hosp 1/08 w/ hypoglycemia due to meds and renal insuffic... she has been given a new meter & checking BS at home> 115-130 she says.  ~  labs 8/08 showed BS= 113, HgA1c=5.4.Marland KitchenMarland Kitchen  ~  labs 4/09 showed BS= 99, HgA1c= 5.5.Marland KitchenMarland Kitchen  ~  labs 10/09 showed BS= 92, HgA1c= 4.8.Marland Kitchen. rec> stop Glimepiride.  ~  labs 4/10 showed BS= 125, A1c= 7.1.Marland Kitchen. rec> better diet, same meds for now.  ~  labs 10/10 showed BS= 114, A1c= 4.8.Marland Kitchen. rec> stop the Avandia, continue Metformin.  ~  labs 4/11 showed BS= 266, A1c= 9.1.Marland KitchenMarland Kitchen what happened? Add Glimep1mg /d. ROV 85mo.  ~  labs 6/11 showed BS= 195, A1c= 7.9.Marland KitchenMarland Kitchen incr Glimep to 2mg /d.  ~  labs 10/11 showed BS= 175, A1c= 7.9.Marland KitchenMarland Kitchen rec incr Gimep to 4mg /d.  HYPERLIPIDEMIA (ICD-272.4) - now on LIPITOR 20mg /d & FENOFIBRATE 160mg /d (ch from Lopid 10/10).  ~  FLP 8/08 showed TChol 129, TG 197, HDL 17, LDL 73... despite taking statin & fibrate drugs.  ~  FLP 4/09 on Lip20+Tric145 showed TChol 127, TG 269, HDL 13, LDL 69... rec> ch Tricor to [phone].  ~  [place] 10/09 on  Lip20+Trilip135 showed TChol 124, TG 247, HDL 10!, LDL 64... rec> ch Trilip to LopidBid.  ~  FLP 4/10 on Lip20+LopidBid showed TChol 174, TG 239, HDL 38, LDL 103... rec> ch Lopid to Feno160 (she never did).  ~  FLP 10/10 showed TChol 134, TG 256, HDL 10, LDL 76... change the Lopid to Fenofib160/d.  ~  FLP 4/11 on Lip20+Feno160 showed TChol 163, TG 234, HDL 28, LDL 108... keep same, better diet!  ~  FLP 10/11 on Lip20+Feno160 showed TChol 172, Tg 304, HDL 26, LDL 112... take meds regularly & better diet.  HYPOTHYROIDISM (ICD-244.9) - on SYNTHROID 194mcg/d...  ~  last TSH 1/08 = 0.41  ~  labs 4/09 showed TSH= 2.35  ~  labs 10/09 on Levothy125  showed TSH= 0.97  ~  labs 4/10 on Levothy125 showed TSH= 0.08... rec> decr [phone].  ~  labs 10/10 on Levothy112 showed TSH= 2.18... keep same.  ~  labs 4/11 on Levo112 showed TSH= 0.51  GERD (ICD-530.81) - takes [place] 40mg  Prn (states Omep20 "makes me sick")...   ~  EGD was planned in 2006 eval but never done...  DIVERTICULOSIS OF COLON (ICD-562.10) - c/o constip & rec> Miralax + Senakot-S...  ~  last colonoscopy 3/06 by DrDBrodie showed divertics only...  RENAL INSUFFICIENCY (ICD-588.9) - prev Creat in the 1.7 - 2.0 range...  ~  labs 8/08 showed BUN=26, Creat=1.5  ~  labs 4/09 showed BUN= 34, Creat= 1.5  ~  labs 10/09 showed BUN= 31, Creat= 1.5  ~  labs 4/10 showed BUN= 28, Creat= 1.0, Microalb= pos... continue Rx (on Diovan).  ~  labs 10/10 showed BUN= 22, Creat= 1.2  ~  labs 4/11 showed BUN= 20, Creat= 1.5  ~  labs 6/11 showed BUN= 20, Creat= 1.3  ~  labs 10/11 showed BUN= 27, Creat= 1.5  DEGENERATIVE JOINT DISEASE, GENERALIZED (ICD-715.00) - she uses Tylenol Prn. LOW BACK PAIN (ICD-724.2) OSTEOPOROSIS (ICD-733.00) - she takes calcium + vits daily...  ~  BMD here 2/06 showed TScores -1.4 Spine,  -1.1 left FemNeck...  ~  labs 4/09 showed Vit D level= 24... rec OTC Vit D 1000 u daily, but she stopped this.  ~  labs 4/10 showed Vit D level = 20... rec> incr OTC Vit D 2000 u daily (she never did).  ~  labs 4/11 showed Vit D level = 22...Marland KitchenMarland KitchenMarland Kitchen rec> incr to 2000 u daily.  HEADACHE (ICD-784.0)  ANXIETY (ICD-300.00) - she notes her Bro has emphysema, lung cancer and CHF... she takes ALPRAZOLAM 0.5mg  Prn.  ANEMIA OF CHRONIC DISEASE (ICD-285.29) - she has a multifactorial anemia w/ Hg= 11 in 2005, Fe was OK, Stools neg for blood, Folate norm, B12 found low, & Creat 1.5+... she take Fe Bid, w/ Vit C.  ~  labs 1/08 showed Hg=11.1  ~  labs 8/08 showed Hg=9.9  ~  labs 4/09 showed Hg= 9.6, Fe= 102, TIBC= 344, Sat= 21%  ~  labs 10/09 showed Hg= 9.1, MCV= 92  ~  labs 4/10 showed = 11.1,  Fe= 71  ~  labs 10/10 showed Hg= 9.3, MCV= 93... rec Fe Bid w/ Vit C...  ~  labs 4/11 showed Hg= 11.8, MCV=87, Fe=86  ~  labs 10/11 showed Hg= 10.9, MCV= 89  VITAMIN B12 DEFICIENCY (ICD-266.2) - Vit B12 level was 77 in 2006 w/ pos parietal cell antibodies... she's been on B12 shots ever since then... f/u B12 level 4/10 = >1500...   Preventive Screening-Counseling & Management  Alcohol-Tobacco  Smoking Status: quit     Year Quit: 1975  Allergies: 1)  ! Quinine 2)  ! Benadryl 3)  ! Codeine 4)  ! Sulfa  Comments:  Nurse/Medical Assistant: The patient's medications and allergies were reviewed with the patient and were updated in the Medication and Allergy Lists.  Past History:  Past Medical History: Hx of ASTHMATIC BRONCHITIS, ACUTE (ICD-466.0) HYPERTENSION (ICD-401.9) Hx of CHEST PAIN (ICD-786.50) DM (ICD-250.00) HYPERLIPIDEMIA (ICD-272.4) HYPOTHYROIDISM (ICD-244.9) GERD (ICD-530.81) DIVERTICULOSIS OF COLON (ICD-562.10) RENAL INSUFFICIENCY (ICD-588.9) DEGENERATIVE JOINT DISEASE, GENERALIZED (ICD-715.00) LOW BACK PAIN (ICD-724.2) OSTEOPOROSIS (ICD-733.00) HEADACHE (ICD-784.0) ANXIETY (ICD-300.00) ANEMIA OF CHRONIC DISEASE (ICD-285.29) VITAMIN B12 DEFICIENCY (ICD-266.2)  Past Surgical History: Appendectomy  Family History: Reviewed history and no changes required.  Social History: Reviewed history from 05/23/2008 and no changes required. smoked about 35 yrs ago--2-3 cigs per day---quit in 1975--smoked for 5 years  Review of Systems      See HPI       The patient complains of dyspnea on exertion.  The patient denies anorexia, fever, weight loss, weight gain, vision loss, decreased hearing, hoarseness, chest pain, syncope, peripheral edema, prolonged cough, headaches, hemoptysis, abdominal pain, melena, hematochezia, severe indigestion/heartburn, hematuria, incontinence, muscle weakness, suspicious skin lesions, transient blindness, difficulty walking,  depression, unusual weight change, abnormal bleeding, enlarged lymph nodes, and angioedema.    Vital Signs:  Patient profile:   77 year old female Height:      66 inches Weight:      154.25 pounds BMI:     24.99 O2 Sat:      100 % on Room air Temp:     98.9 degrees F oral Pulse rate:   82 / minute BP sitting:   140 / 76  (right arm) Cuff size:   regular  Vitals Entered By: Elita Boone CMA (November 12, 2009 11:49 AM)  O2 Sat at Rest %:  100 O2 Flow:  Room air CC: 3 month ROV & review of mult medical problems... Is Patient Diabetic? Yes Pain Assessment Patient in pain? no      Comments meds updated today with pt   Physical Exam  Additional Exam:   WD, WN, 77 y/o WF in NAD... GENERAL:  Alert & oriented; pleasant & cooperative... HEENT:  [place]/AT, EOM-wnl, PERRLA, EACs-clear, TMs- sl red on left, NOSE-clear, THROAT-clear & wnl. NECK:  Decr ROM; no JVD; normal carotid impulses w/o bruits; no thyromegaly or nodules palpated; no lymphadenopathy. CHEST:  Clear to P & A; without wheezes/ rales/ or rhonchi. HEART:  Regular Rhythm; without murmurs/ rubs/ or gallops. ABDOMEN:  Soft & nontender; normal bowel sounds; no organomegaly or masses detected. EXT: without deformities, mild arthritic changes; no varicose veins/ +venous insuffic/ no edema. NEURO:  CN's intact; no focal neuro deficits... DERM:  no lesions noted...    MISC. Report  Procedure date:  11/12/2009  Findings:      BMP (METABOL)   Sodium                    139 mEq/L                   135-145   Potassium                 4.8 mEq/L                   3.5-5.1   Chloride  105 mEq/L                   96-112   Carbon Dioxide            24 mEq/L                    19-32   Glucose              [H]  175 mg/dL                   70-99   BUN                  [H]  27 mg/dL                    6-23   Creatinine           [H]  1.5 mg/dL                   0.4-1.2   Calcium                   10.2 mg/dL                   8.4-10.5   GFR                       36.07 mL/min                >60  Hepatic/Liver Function Panel (HEPATIC)   Total Bilirubin           0.7 mg/dL                   0.3-1.2   Direct Bilirubin          0.1 mg/dL                   0.0-0.3   Alkaline Phosphatase      45 U/L                      39-117   AST                       22 U/L                      0-37   ALT                       15 U/L                      0-35   Total Protein             7.6 g/dL                    6.0-8.3   Albumin                   4.3 g/dL                    3.5-5.2  CBC Platelet w/Diff (CBCD)   White Cell Count          6.0 K/uL                    4.5-10.5   Red Cell Count       [  L]  3.64 Mil/uL                 3.87-5.11   Hemoglobin           [L]  10.9 g/dL                   12.0-15.0   Hematocrit           [L]  32.5 %                      36.0-46.0   MCV                       89.3 fl                     78.0-100.0   Platelet Count            221.0 K/uL                  150.0-400.0   Neutrophil %              59.0 %                      43.0-77.0   Lymphocyte %              22.4 %                      12.0-46.0   Monocyte %           [H]  12.1 %                      3.0-12.0   Eosinophils%         [H]  5.3 %                       0.0-5.0   Basophils %               1.2 %                       0.0-3.0  Comments:      Lipid Panel (LIPID)   Cholesterol               172 mg/dL                   0-200   Triglycerides        [H]  304.0 mg/dL                 0.0-149.0   HDL                  [L]  25.90 mg/dL                 >39.00  Cholesterol LDL - Direct                             112.0 mg/dL  Hemoglobin A1C (A1C)   Hemoglobin A1C       [H]  7.9 %                       4.6-6.5   Impression & Recommendations:  Problem # 1:  HYPERTENSION (ICD-401.9) Controlled>  same med... Her updated medication list for this  problem includes:    Diovan Hct 320-12.5 Mg Tabs (Valsartan-hydrochlorothiazide) .Marland Kitchen...  Take 1 tab by mouth once daily...  Orders: TLB-BMP (Basic Metabolic Panel-BMET) (99991111) TLB-Hepatic/Liver Function Pnl (80076-HEPATIC) TLB-CBC Platelet - w/Differential (85025-CBCD) TLB-Lipid Panel (80061-LIPID) TLB-A1C / Hgb A1C (Glycohemoglobin) (83036-A1C)  Problem # 2:  HYPERLIPIDEMIA (ICD-272.4) Not at goal>  needs better diet effort, take meds regularly... Her updated medication list for this problem includes:    Lipitor 20 Mg Tabs (Atorvastatin calcium) .Marland Kitchen... 1 by mouth daily    Fenofibrate 160 Mg Tabs (Fenofibrate) .Marland Kitchen... Take 1 tablet by mouth once a day  Problem # 3:  DM (ICD-250.00) A1c still 7.9.Marland KitchenMarland Kitchen need better low carb no sweets diet & incr Glimep to 4mg /d... Her updated medication list for this problem includes:    Adult Aspirin Low Strength 81 Mg Tbdp (Aspirin) .Marland Kitchen... Take 1 by mouth daily    Diovan Hct 320-12.5 Mg Tabs (Valsartan-hydrochlorothiazide) .Marland Kitchen... Take 1 tab by mouth once daily...    Metformin Hcl 500 Mg Tabs (Metformin hcl) .Marland Kitchen... 1 tab by mouth twice a day    Amaryl 2 Mg Tabs (Glimepiride) .Marland Kitchen... Take 1 tab by mouth once daily in the am...  Problem # 4:  HYPOTHYROIDISM (ICD-244.9) Stable>  keep same... Her updated medication list for this problem includes:    Synthroid 112 Mcg Tabs (Levothyroxine sodium) .Marland Kitchen... Take 1 tablet by mouth once a day  Problem # 5:  RENAL INSUFFICIENCY (ICD-588.9) Stable mild RI... stay well hydrated, avoid nephrotoxins, etc...  Problem # 6:  ANEMIA OF CHRONIC DISEASE (ICD-285.29) Continue Fe, B12 etc... Her updated medication list for this problem includes:    Cyanocobalamin 1000 Mcg/ml Inj Soln (Cyanocobalamin) ..... Inject 1 vial every month    Cvs Iron 325 (65 Fe) Mg Tabs (Ferrous sulfate) .Marland Kitchen... Take 1 tablet by mouth daily...  Complete Medication List: 1)  Adult Aspirin Low Strength 81 Mg Tbdp (Aspirin) .... Take 1 by mouth daily 2)  Diovan Hct 320-12.5 Mg Tabs (Valsartan-hydrochlorothiazide) .... Take 1 tab by mouth  once daily.Marland KitchenMarland Kitchen 3)  Lipitor 20 Mg Tabs (Atorvastatin calcium) .Marland Kitchen.. 1 by mouth daily 4)  Fenofibrate 160 Mg Tabs (Fenofibrate) .... Take 1 tablet by mouth once a day 5)  Metformin Hcl 500 Mg Tabs (Metformin hcl) .Marland Kitchen.. 1 tab by mouth twice a day 6)  Amaryl 2 Mg Tabs (Glimepiride) .... Take 1 tab by mouth once daily in the am... 7)  Synthroid 112 Mcg Tabs (Levothyroxine sodium) .... Take 1 tablet by mouth once a day 8)  Nexium 40 Mg Cpdr (Esomeprazole magnesium) .Marland Kitchen.. 1 by mouth once daily 9)  Caltrate 600+d Plus 600-400 Mg-unit Tabs (Calcium carbonate-vit d-min) .Marland Kitchen.. 1 tab twice daily 10)  Multivitamins Caps (Multiple vitamin) .Marland Kitchen.. 1 by mouth daily 11)  Cyanocobalamin 1000 Mcg/ml Inj Soln (Cyanocobalamin) .... Inject 1 vial every month 12)  Vitamin D3 2000 Unit Caps (Cholecalciferol) .... Take one capsule by mouth once daily 13)  Cvs Iron 325 (65 Fe) Mg Tabs (Ferrous sulfate) .... Take 1 tablet by mouth daily... 14)  Bayer Contour Test Strp (Glucose blood) .... Test blood sugars as directed  Other Orders: Flu Vaccine 30yrs + MEDICARE PATIENTS [id number]) Administration Flu vaccine - MCR (G0008) Vit B12 1000 mcg (J3420) Admin of Therapeutic Inj  intramuscular or subcutaneous [id number])  Patient Instructions: 1)  Today we updated your med list- see below.... 2)  Continue your current meds the same for now... 3)  Today we did your follow up FASTING blood work... please call  the "phone tree" in a few days for your lab results.Marland KitchenMarland Kitchen 4)  We also gave you the 2011 Flu vaccine & your B12 shot... 5)  Call for any problems.Marland KitchenMarland Kitchen 6)  Please schedule a follow-up appointment in 6 months.    Flu Vaccine Consent Questions     Do you have a history of severe allergic reactions to this vaccine? no    Any prior history of allergic reactions to egg and/or gelatin? no    Do you have a sensitivity to the preservative Thimersol? no    Do you have a past history of Guillan-Barre Syndrome? no    Do you currently have an  acute febrile illness? no    Have you ever had a severe reaction to latex? no    Vaccine information given and explained to patient? yes    Are you currently pregnant? no    Lot Number:[id number]   Exp Date:08/10/2010   Site Given  Left Deltoid IMflu   Elita Boone CMA  November 12, 2009 12:43 PM    Medication Administration  Injection # 1:    Medication: Vit B12 1000 mcg    Diagnosis: VITAMIN B12 DEFICIENCY (ICD-266.2)    Route: IM    Site: R deltoid    Exp Date: 03/2011    Lot #: [id number]    Patient tolerated injection without complications    Given by: Elita Boone CMA (November 12, 2009 12:43 PM)  Orders Added: 1)  Est. Patient Level IV [id number] 2)  TLB-BMP (Basic Metabolic Panel-BMET) [id number] 3)  TLB-Hepatic/Liver Function Pnl [80076-HEPATIC] 4)  TLB-CBC Platelet - w/Differential [85025-CBCD] 5)  TLB-Lipid Panel [80061-LIPID] 6)  TLB-A1C / Hgb A1C (Glycohemoglobin) [83036-A1C] 7)  Flu Vaccine 51yrs + MEDICARE PATIENTS [Q2039] 8)  Administration Flu vaccine - MCR [G0008] 9)  Vit B12 1000 mcg [J3420] 10)  Admin of Therapeutic Inj  intramuscular or subcutaneous [id number]
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
## 2010-03-12 NOTE — Progress Notes (Signed)
Summary: Records request from Gwynn Burly. Burnard Leigh at [facility] [employer] for records received from [employer]. Donalda Ewings, Forensic psychologist at [facility]. Request forwarded to [facility]. Valda Favia  February 20, 2009 1:46 PM

## 2010-05-09 ENCOUNTER — Encounter: Payer: Self-pay | Admitting: Pulmonary Disease

## 2010-05-13 ENCOUNTER — Ambulatory Visit (INDEPENDENT_AMBULATORY_CARE_PROVIDER_SITE_OTHER): Payer: Medicare Other | Admitting: Pulmonary Disease

## 2010-05-13 ENCOUNTER — Other Ambulatory Visit (INDEPENDENT_AMBULATORY_CARE_PROVIDER_SITE_OTHER): Payer: Medicare Other | Admitting: Pulmonary Disease

## 2010-05-13 ENCOUNTER — Other Ambulatory Visit (INDEPENDENT_AMBULATORY_CARE_PROVIDER_SITE_OTHER): Payer: Medicare Other

## 2010-05-13 ENCOUNTER — Encounter: Payer: Self-pay | Admitting: Pulmonary Disease

## 2010-05-13 ENCOUNTER — Other Ambulatory Visit: Payer: Self-pay | Admitting: Pulmonary Disease

## 2010-05-13 VITALS — BP 126/78 | HR 79 | Temp 97.8°F | Ht 66.0 in | Wt 152.2 lb

## 2010-05-13 DIAGNOSIS — E119 Type 2 diabetes mellitus without complications: Secondary | ICD-10-CM

## 2010-05-13 DIAGNOSIS — F411 Generalized anxiety disorder: Secondary | ICD-10-CM

## 2010-05-13 DIAGNOSIS — R079 Chest pain, unspecified: Secondary | ICD-10-CM

## 2010-05-13 DIAGNOSIS — E039 Hypothyroidism, unspecified: Secondary | ICD-10-CM

## 2010-05-13 DIAGNOSIS — M810 Age-related osteoporosis without current pathological fracture: Secondary | ICD-10-CM

## 2010-05-13 DIAGNOSIS — E785 Hyperlipidemia, unspecified: Secondary | ICD-10-CM

## 2010-05-13 DIAGNOSIS — I10 Essential (primary) hypertension: Secondary | ICD-10-CM

## 2010-05-13 DIAGNOSIS — N259 Disorder resulting from impaired renal tubular function, unspecified: Secondary | ICD-10-CM

## 2010-05-13 DIAGNOSIS — D638 Anemia in other chronic diseases classified elsewhere: Secondary | ICD-10-CM

## 2010-05-13 DIAGNOSIS — R269 Unspecified abnormalities of gait and mobility: Secondary | ICD-10-CM

## 2010-05-13 LAB — CBC WITH DIFFERENTIAL/PLATELET
Basophils Relative: 0.8 % (ref 0.0–3.0)
Eosinophils Relative: 2.9 % (ref 0.0–5.0)
Hemoglobin: 10.3 g/dL — ABNORMAL LOW (ref 12.0–15.0)
Lymphocytes Relative: 19.5 % (ref 12.0–46.0)
MCV: 85 fl (ref 78.0–100.0)
Monocytes Absolute: 0.6 10*3/uL (ref 0.1–1.0)
Neutro Abs: 3.6 10*3/uL (ref 1.4–7.7)
Neutrophils Relative %: 66.1 % (ref 43.0–77.0)
RBC: 3.51 Mil/uL — ABNORMAL LOW (ref 3.87–5.11)
WBC: 5.4 10*3/uL (ref 4.5–10.5)

## 2010-05-13 LAB — BASIC METABOLIC PANEL
BUN: 28 mg/dL — ABNORMAL HIGH (ref 6–23)
Calcium: 9.6 mg/dL (ref 8.4–10.5)
Chloride: 101 mEq/L (ref 96–112)
Creatinine, Ser: 1.4 mg/dL — ABNORMAL HIGH (ref 0.4–1.2)

## 2010-05-13 LAB — HEPATIC FUNCTION PANEL
ALT: 18 U/L (ref 0–35)
Bilirubin, Direct: 0.2 mg/dL (ref 0.0–0.3)
Total Bilirubin: 0.6 mg/dL (ref 0.3–1.2)

## 2010-05-13 LAB — LIPID PANEL
Cholesterol: 160 mg/dL (ref 0–200)
HDL: 19.3 mg/dL — ABNORMAL LOW (ref >39.00)
Total CHOL/HDL Ratio: 8
Triglycerides: 258 mg/dL — ABNORMAL HIGH (ref 0.0–149.0)
VLDL: 51.6 mg/dL — ABNORMAL HIGH (ref 0.0–40.0)

## 2010-05-13 LAB — LDL CHOLESTEROL, DIRECT: Direct LDL: 99.5 mg/dL

## 2010-05-13 LAB — HEMOGLOBIN A1C: Hgb A1c MFr Bld: 10.3 % — ABNORMAL HIGH (ref 4.6–6.5)

## 2010-05-13 MED ORDER — VITAMIN D 50 MCG (2000 UT) PO TABS: 2000.0000 [IU] | ORAL_TABLET | Freq: Every day | ORAL | Status: DC

## 2010-05-13 MED ORDER — ALPRAZOLAM 0.25 MG PO TABS: 0.2500 mg | ORAL_TABLET | Freq: Three times a day (TID) | ORAL | Status: DC | PRN

## 2010-05-13 NOTE — Progress Notes (Signed)
Subjective:    Patient ID: Jocelyn Mendez, female    DOB: January 14, 1931, 77 y.o.   MRN: [id number]  HPI 77 y/o WF here for a follow up visit... she has mult med problems including:  HBP, Hypercholesterolemia, DM, mild renal insuffic, & multifactorial anemia w/ B12 defic on shots monthly... she also has DJD, LBP, Osteopenia, etc...  ~  November 12, 2009:  states BS improving 115-130 range at home but FBS today 175 & A1c still 7.9 on MetformBid & Glimep2mg  (states she's taking meds regularly)... FLP fair on Lip20 + Feno160, needs better low chol/ low fat diet... BP controlled & otherw feeling OK w/o CP, palpit, SOB, edema, etc... we reviewed the importance of diet + exercise, & will incr the Glimep to 4mg /d...  ~  May 13, 2010:  6 month ROV & she notes incr anxiety & she wants to incr her Alprazolam to 0.25mg  Tid;  Also notes "staggering" & "chest pains"> she wants to see Neurology for eval of the former, and Cardiology to check the latter (see below)...    Cardiac>  BP controlled on Diovan/HCT & BP= 126/78 today w/o postural changes;  She is c/o dull squeezing CP that goes to her left arm on occas but is not really activity related, & quite variable;  She denies HA, visula symptoms, ch in SOB, edema, etc;  EKG shows NSR, borderline 1st degree AVB, poor R prog V1-4, NAD (unchanged from old tracings);  She has hx atypical CP in past (mostly sharp CWP treated w/ Tylenol) & had cath 1988 w/ norm coronaries & mild MVP seen;  Pt daughter wants a Cards consult...    Hyperlipid>  On Lip20 + Fenofib160, plus her low fat diet;  FLP is no better today w/ TChol 160, TG 258, HDL 19, LDL 100;  Needs better low fat diet, offered ch to Cres20 but cost a factor, didn't do any better on Lopid in past & reluctant to take Niacin due to flushing...    DM>  On Metformin500Bid & Glimep4mg /d and her home BS checks are elev in the 150-200 range she says;  Prev A1c's = 7-9 over the last yr;  BS today 290 & A1c=10.3;  We discussed  need for much better diet, & we will need to incr her Metform to 1000mg  Bid & add Onglyza 5mg /d; she wants to avoid "the needle"...    Hypothy>  On Synthroid 113mcg/d & TSH is stable = 1.02 today, continue same dose...    Renal Insuffic>  Renal function is stable w/ BUN= 28, Creat= 1.4 today...    DJD & Gait Abn>  She is also c/o "staggering" all the time she says, by this she is referring to a multifactorial gait abn (not dizzy, pre-syncopal, etc);  She has DJD, cerv & lumbar spondylosis, osteopenia, vit d defic, etc;  Daughter wants Neuro eval for her staggering prob> she had CHI 9/10, seen [facility] ER w/ CT showing atrophy & tiny SAH that resolved quickly...    Anemia>  She has pernicious anemia on B12 shots monthly & a multifactorial anemia for which she takes Fe, Vit C, MVI;  Hg=10.3, MCV=85, continue same Rx...    Anxiety>  On Alprazolam 0,25mg  but only taking 1/2 tab prn;  rec to incr dose to 1 tab tid...   Past Medical History  Diagnosis Date  . Acute bronchitis   . Unspecified essential hypertension   . Chest pain, unspecified   . Type II or unspecified type  diabetes mellitus without mention of complication, not stated as uncontrolled   . Other and unspecified hyperlipidemia   . Unspecified hypothyroidism   . Esophageal reflux   . Diverticulosis of colon (without mention of hemorrhage)   . Unspecified disorder resulting from impaired renal function   . Generalized osteoarthrosis, unspecified site   . Lumbago   . Osteoporosis   . Headache   . Anxiety state, unspecified   . Anemia of other chronic disease   . Other B-complex deficiencies     Past Surgical History  Procedure Date  . Appendectomy     Outpatient Encounter Prescriptions as of 05/13/2010  Medication Sig Dispense Refill  . aspirin 81 MG tablet Take 81 mg by mouth daily.        Marland Kitchen atorvastatin (LIPITOR) 20 MG tablet Take 20 mg by mouth daily.        . Calcium Carbonate-Vitamin D 600-400 MG-UNIT per tablet Take 1 tablet by  mouth 2 (two) times daily.        . Cholecalciferol (VITAMIN D3) 3000 UNITS TABS Take 1 capsule by mouth daily.        . cyanocobalamin (,VITAMIN B-12,) 1000 MCG/ML injection Inject 1,000 mcg into the muscle every 30 (thirty) days.        Marland Kitchen esomeprazole (NEXIUM) 40 MG capsule Take 40 mg by mouth daily before breakfast.        . fenofibrate 160 MG tablet Take 160 mg by mouth daily.        Marland Kitchen glimepiride (AMARYL) 4 MG tablet Take 4 mg by mouth daily before breakfast.        . glucose blood test strip 1 each by Other route as needed. Use as instructed       . levothyroxine (SYNTHROID, LEVOTHROID) 112 MCG tablet Take 112 mcg by mouth daily.        . metFORMIN (GLUCOPHAGE) 500 MG tablet Take 500 mg by mouth 2 (two) times daily with a meal.        . Multiple Vitamin (MULTIVITAMIN) capsule Take 1 capsule by mouth daily.        . valsartan-hydrochlorothiazide (DIOVAN-HCT) 320-12.5 MG per tablet Take 1 tablet by mouth daily.          Allergies  Allergen Reactions  . Codeine     REACTION: change in mental status  . Diphenhydramine Hcl     REACTION: itching  . Quinine     REACTION: abnormal sensations in face  . Sulfonamide Derivatives     REACTION: rash    Review of Systems        See HPI - all other systems neg except as noted...       The patient complains of CP & staggering.  The patient denies anorexia, fever, weight loss, weight gain, vision loss, decreased hearing, hoarseness, syncope, peripheral edema, prolonged cough, headaches, hemoptysis, abdominal pain, melena, hematochezia, severe indigestion/heartburn, hematuria, incontinence, muscle weakness, suspicious skin lesions, transient blindness, depression, unusual weight change, abnormal bleeding, enlarged lymph nodes, and angioedema.     Objective:   Physical Exam      WD, WN, 77 y/o WF in NAD... GENERAL:  Alert & oriented; pleasant & cooperative... HEENT:  [place]/AT, EOM-wnl, PERRLA, EACs-clear, TMs- sl red on left, NOSE-clear,  THROAT-clear & wnl. NECK:  Decr ROM; no JVD; normal carotid impulses w/o bruits; no thyromegaly or nodules palpated; no lymphadenopathy. CHEST:  Clear to P & A; without wheezes/ rales/ or rhonchi heard... HEART:  Regular Rhythm; without  murmurs/ rubs/ or gallops detected... ABDOMEN:  Soft & nontender; normal bowel sounds; no organomegaly or masses palpated... EXT: without deformities, mild arthritic changes; no varicose veins/ +venous insuffic/ no edema. NEURO:  CN's intact; no focal neuro deficits or weakness found... DERM:  no lesions noted...   Assessment & Plan:

## 2010-05-13 NOTE — Patient Instructions (Signed)
We updated your med list today>    Continue your current meds the same for now---    But change the Vit D supplement to at least 2000 u daily.  Today we did your follow up FASTING blood work>    Please call the PHONE TREE in a few days for your results:    Dial [phone] & when prompted enter your pt number followed by the # symbol.    Your pt number =  [phone]  We are going to refer you to the [facility] for a cardiac eval of your chest pain... We will also set up an appt w/ [facility] to look into your "staggering" (gait abnormality)...  Call for any questions... Let's schedule a follow up appt in 3 months to check your progress.Marland KitchenMarland Kitchen

## 2010-05-14 ENCOUNTER — Encounter: Payer: Self-pay | Admitting: Pulmonary Disease

## 2010-05-14 NOTE — Assessment & Plan Note (Signed)
She has only been taking 1/2 of the 0.25mg  tabs of alprazolam & is rec to incr to a whole tab TID for better effect;  She has considerable personal/ family stress.Marland KitchenMarland Kitchen

## 2010-05-14 NOTE — Assessment & Plan Note (Signed)
TSH remains stable on the Synthroid 112 mcg daily;  Continue same.Marland KitchenMarland Kitchen

## 2010-05-14 NOTE — Assessment & Plan Note (Signed)
One of her new complaints today = "staggering" & by this she means a gait abn which appears to me to be multifactorial;  Daughter wants her to have a Neuro eval & we will refer to [facility];  Perhaps some PT would help.Marland KitchenMarland Kitchen

## 2010-05-14 NOTE — Assessment & Plan Note (Signed)
She has had a difficult time w/ control & we have worked on medication compliance & diet Rx;  Follow up FLP reviewed & about the same on her Lip20 + Fenofib160>  No diff on other meds & cost is an issue for her compliance issues;  rec continue same meds regularly & better low fat diet.Marland KitchenMarland Kitchen

## 2010-05-14 NOTE — Assessment & Plan Note (Signed)
What happened?  DM control is worse on her Metformin + Glimep;  We reviewed diet + exercise;  We reviewed medication compliance issues;  Her BS=290, A1c=10.3, and we rec> incr the Metformin to 1000mg  bid, continue glimep 4mg  in AM, add Onglyza 5mg  at dinner... NOTE: she wants to avoid "the needle"...

## 2010-05-14 NOTE — Assessment & Plan Note (Signed)
Lab cumulative summary flowchart is reviewed & Creat= 1.4 today, stable, no progressive changes etc..Marland Kitchen

## 2010-05-14 NOTE — Assessment & Plan Note (Signed)
BP controlled on Diovan/HCT & BP= 126/78 today w/o postural changes;  She is c/o dull squeezing CP that goes to her left arm on occas but is not really activity related, & quite variable;  She denies HA, visula symptoms, ch in SOB, edema, etc;  EKG shows NSR, borderline 1st degree AVB, poor R prog V1-4, NAD (unchanged from old tracings);  She has hx atypical CP in past (mostly sharp CWP treated w/ Tylenol) & had cath 1988 w/ norm coronaries & mild MVP seen;  Pt daughter wants a Cards consult.Marland KitchenMarland Kitchen

## 2010-05-15 ENCOUNTER — Encounter: Payer: Self-pay | Admitting: Cardiovascular Disease

## 2010-05-15 ENCOUNTER — Telehealth: Payer: Self-pay | Admitting: *Deleted

## 2010-05-15 ENCOUNTER — Ambulatory Visit (INDEPENDENT_AMBULATORY_CARE_PROVIDER_SITE_OTHER): Payer: Medicare Other | Admitting: Cardiovascular Disease

## 2010-05-15 DIAGNOSIS — E785 Hyperlipidemia, unspecified: Secondary | ICD-10-CM

## 2010-05-15 DIAGNOSIS — R079 Chest pain, unspecified: Secondary | ICD-10-CM

## 2010-05-15 DIAGNOSIS — I10 Essential (primary) hypertension: Secondary | ICD-10-CM

## 2010-05-15 MED ORDER — SAXAGLIPTIN HCL 5 MG PO TABS: 5.0000 mg | ORAL_TABLET | Freq: Every day | ORAL | Status: DC

## 2010-05-15 MED ORDER — METFORMIN HCL 500 MG PO TABS: ORAL_TABLET | ORAL | Status: DC

## 2010-05-15 NOTE — Assessment & Plan Note (Signed)
Atypical pain with nonspecific ECG changes in elderly diabetic.  Lexiscan myovue

## 2010-05-15 NOTE — Patient Instructions (Signed)
Your physician recommends that you schedule a follow-up appointment in only as necessary Your physician has requested that you have a lexiscan myoview. For further information please visit [email or web address]. Please follow instruction sheet, as given.

## 2010-05-15 NOTE — Assessment & Plan Note (Signed)
Cholesterol is at goal.  Continue current dose of statin and diet Rx.  No myalgias or side effects.  F/U  LFT's in 6 months. Lab Results  Component Value Date   LDLCALC 73 09/23/2006

## 2010-05-15 NOTE — Assessment & Plan Note (Signed)
Well controlled.  Continue current medications and low sodium Dash type diet.    

## 2010-05-15 NOTE — Telephone Encounter (Signed)
Pt is aware of meds sent to the pharmacy and the change in meds.

## 2010-05-15 NOTE — Progress Notes (Signed)
77 yo referred by Dr Lenna Gilford for atypical chest pain  CRF';s include HTN and elevated lipids and DM .  Pain is sharp, left sided and radiates to axilla.  Not exertional.  Pain since end of last year.  Not progressive but persistant.  Nothing in particular makes it better  Not associated with dyspnea, palpitations,diaphoresis or syncope.  Compliant with meds.  Recent gait instability with F/U neuro appt upcoming.  No other history of vascular disease.  Longstanding noninsulin dependant DM.   Activity limited by gait.  Former smoker quit in 80's.  Had cath in 1988 with no significant disease  ROS: Denies fever, malais, weight loss, blurry vision, decreased visual acuity, cough, sputum, SOB, hemoptysis, pleuritic pain, palpitaitons, heartburn, abdominal pain, melena, lower extremity edema, claudication, or rash.   General: Affect appropriate Chronically ill appearing HEENT: normal Neck supple with no adenopathy JVP normal no bruits no thyromegaly Lungs clear with no wheezing and good diaphragmatic motion Heart:  S1/S2 no murmur,rub, gallop or click PMI normal Abdomen: benighn, BS positve, no tenderness, no AAA no bruit.  No HSM or HJR Distal pulses intact with no bruits No edema Neuro non-focal Skin warm and dry No muscular weakness   Current Outpatient Prescriptions  Medication Sig Dispense Refill  . ALPRAZolam (XANAX) 0.25 MG tablet Take 1 tablet (0.25 mg total) by mouth 3 (three) times daily as needed for anxiety.  90 tablet  5  . aspirin 81 MG tablet Take 81 mg by mouth daily.        Marland Kitchen atorvastatin (LIPITOR) 20 MG tablet Take 20 mg by mouth daily.        . Calcium Carbonate-Vitamin D 600-400 MG-UNIT per tablet Take 1 tablet by mouth 2 (two) times daily.        . Cholecalciferol (VITAMIN D3) 3000 UNITS TABS Take 1 capsule by mouth daily.        . cyanocobalamin (,VITAMIN B-12,) 1000 MCG/ML injection Inject 1,000 mcg into the muscle every 30 (thirty) days.        Marland Kitchen esomeprazole (NEXIUM)  40 MG capsule Take 40 mg by mouth daily before breakfast.        . fenofibrate 160 MG tablet Take 160 mg by mouth daily.        Marland Kitchen glimepiride (AMARYL) 4 MG tablet Take 4 mg by mouth daily before breakfast.        . glucose blood test strip 1 each by Other route as needed. Use as instructed       . levothyroxine (SYNTHROID, LEVOTHROID) 112 MCG tablet Take 112 mcg by mouth daily.        . metFORMIN (GLUCOPHAGE) 500 MG tablet Take 500 mg by mouth 2 (two) times daily with a meal.        . Multiple Vitamin (MULTIVITAMIN) capsule Take 1 capsule by mouth daily.        . valsartan-hydrochlorothiazide (DIOVAN-HCT) 320-12.5 MG per tablet Take 1 tablet by mouth daily.        . Cholecalciferol (VITAMIN D) 2000 UNITS tablet Take 1 tablet (2,000 Units total) by mouth daily.  30 tablet  11    Allergies  Codeine; Diphenhydramine hcl; Quinine; and Sulfonamide derivatives  History   Social History  . Marital Status: Single    Spouse Name: N/A    Number of Children: 6  . Years of Education: N/A   Occupational History  . Not on file.   Social History Main Topics  . Smoking status: Former Smoker  Types: Cigarettes    Quit date: 02/10/1973  . Smokeless tobacco: Former Systems developer    Types: Snuff    Quit date: 04/07/2010  . Alcohol Use: No  . Drug Use: No  . Sexually Active: Not on file   Other Topics Concern  . Not on file   Social History Narrative   1 child passed away at 73 weeks old---?crib death   Family History:  No prematrue CAD  Electrocardiogram:  NSR 83  Nonspecific lateral ST changes  Assessment and Plan

## 2010-05-21 ENCOUNTER — Telehealth: Payer: Self-pay | Admitting: Pulmonary Disease

## 2010-05-21 NOTE — Telephone Encounter (Signed)
Pt has appt tomorrow with SN at 4pm

## 2010-05-21 NOTE — Telephone Encounter (Signed)
Pt's son states pt is very weak, blood sugars are either high (350+) or low (68). She also c/o diarrhea and stomach pain for 2 days. Pt scheduled to see SN on Wed. 05/22/2010, at 4 pm. Pls advise of recs.

## 2010-05-22 ENCOUNTER — Ambulatory Visit (INDEPENDENT_AMBULATORY_CARE_PROVIDER_SITE_OTHER): Payer: Medicare Other | Admitting: Pulmonary Disease

## 2010-05-22 DIAGNOSIS — E119 Type 2 diabetes mellitus without complications: Secondary | ICD-10-CM

## 2010-05-22 DIAGNOSIS — K529 Noninfective gastroenteritis and colitis, unspecified: Secondary | ICD-10-CM

## 2010-05-22 DIAGNOSIS — K5289 Other specified noninfective gastroenteritis and colitis: Secondary | ICD-10-CM

## 2010-05-22 DIAGNOSIS — I10 Essential (primary) hypertension: Secondary | ICD-10-CM

## 2010-05-22 DIAGNOSIS — R079 Chest pain, unspecified: Secondary | ICD-10-CM

## 2010-05-22 MED ORDER — PROMETHAZINE HCL 25 MG PO TABS: 25.0000 mg | ORAL_TABLET | Freq: Four times a day (QID) | ORAL | Status: AC | PRN

## 2010-05-22 NOTE — Progress Notes (Signed)
Subjective:    Patient ID: Jocelyn Mendez, female    DOB: 02/24/1930, 77 y.o.   MRN: [id number]  HPI  77 y/o WF here for an add-on visit... she has mult med problems including:  HBP, Hypercholesterolemia, DM, mild renal insuffic, & multifactorial anemia w/ B12 defic on shots monthly... she also has DJD, LBP, Osteopenia, etc...  ~  November 12, 2009:  states BS improving 115-130 range at home but FBS today 175 & A1c still 7.9 on MetformBid & Glimep2mg  (states she's taking meds regularly)... FLP fair on Lip20 + Feno160, needs better low chol/ low fat diet... BP controlled & otherw feeling OK w/o CP, palpit, SOB, edema, etc... we reviewed the importance of diet + exercise, & will incr the Glimep to 4mg /d...  ~  May 13, 2010:  6 month ROV & she notes incr anxiety & she wants to incr her Alprazolam to 0.25mg  Tid;  Also notes "staggering" & "chest pains"> she wants to see Neurology for eval of the former, and Cardiology to check the latter (see below)...    Cardiac>  BP controlled on Diovan/HCT & BP= 126/78 today w/o postural changes;  She is c/o dull squeezing CP that goes to her left arm on occas but is not really activity related, & quite variable;  She denies HA, visula symptoms, ch in SOB, edema, etc;  EKG shows NSR, borderline 1st degree AVB, poor R prog V1-4, NAD (unchanged from old tracings);  She has hx atypical CP in past (mostly sharp CWP treated w/ Tylenol) & had cath 1988 w/ norm coronaries & mild MVP seen;  Pt daughter wants a Cards consult...    Hyperlipid>  On Lip20 + Fenofib160, plus her low fat diet;  FLP is no better today w/ TChol 160, TG 258, HDL 19, LDL 100;  Needs better low fat diet, offered ch to Cres20 but cost a factor, didn't do any better on Lopid in past & reluctant to take Niacin due to flushing...    DM>  On Metformin500Bid & Glimep4mg /d and her home BS checks are elev in the 150-200 range she says;  Prev A1c's = 7-9 over the last yr;  BS today 290 & A1c=10.3;  We discussed  need for much better diet, & we will need to incr her Metform to 1000mg  Bid & add Onglyza 5mg /d; she wants to avoid "the needle"...    Hypothy>  On Synthroid 110mcg/d & TSH is stable = 1.02 today, continue same dose...    Renal Insuffic>  Renal function is stable w/ BUN= 28, Creat= 1.4 today...    DJD & Gait Abn>  She is also c/o "staggering" all the time she says, by this she is referring to a multifactorial gait abn (not dizzy, pre-syncopal, etc);  She has DJD, cerv & lumbar spondylosis, osteopenia, vit d defic, etc;  Daughter wants Neuro eval for her staggering prob> she had CHI 9/10, seen [facility] w/ CT showing atrophy & tiny SAH that resolved quickly...    Anemia>  She has pernicious anemia on B12 shots monthly & a multifactorial anemia for which she takes Fe, Vit C, MVI;  Hg=10.3, MCV=85, continue same Rx...    Anxiety>  On Alprazolam 0,25mg  but only taking 1/2 tab prn;  rec to incr dose to 1 tab tid...  ~  May 22, 2010:  Family call for an urgent add-on appt due to apparent gastroenteritis w/ N V D> nausea x 3-4d w/ fair intake, vomit x1 but keeping  fluids down, & diarrhea x 4-5 times daily for several days but stopped last PM & no BM today;  She denies F C S, states appetite is good, just feeling weak & BS are up & down;  otherw situation is as above, they needed a lot of reassurance & support, it does not appear as though the Metformin was causing the diarrhea as it has stopped.   Past Medical History  Diagnosis Date  . Acute bronchitis   . Unspecified essential hypertension   . Chest pain, unspecified   . Type II or unspecified type diabetes mellitus without mention of complication, not stated as uncontrolled   . Other and unspecified hyperlipidemia   . Unspecified hypothyroidism   . Esophageal reflux   . Diverticulosis of colon (without mention of hemorrhage)   . Unspecified disorder resulting from impaired renal function   . Generalized osteoarthrosis, unspecified site   . Lumbago     . Osteoporosis   . Headache   . Anxiety state, unspecified   . Anemia of other chronic disease   . Other B-complex deficiencies      Past Surgical History  Procedure Date  . Appendectomy     Outpatient Encounter Prescriptions as of 05/22/2010  Medication Sig Dispense Refill  . ALPRAZolam (XANAX) 0.25 MG tablet Take 1 tablet (0.25 mg total) by mouth 3 (three) times daily as needed for anxiety.  90 tablet  5  . aspirin 81 MG tablet Take 81 mg by mouth daily.        Marland Kitchen atorvastatin (LIPITOR) 20 MG tablet Take 20 mg by mouth daily.        . Calcium Carbonate-Vitamin D 600-400 MG-UNIT per tablet Take 1 tablet by mouth 2 (two) times daily.        . Cholecalciferol (VITAMIN D3) 3000 UNITS TABS Take 1 capsule by mouth daily.        . cyanocobalamin (,VITAMIN B-12,) 1000 MCG/ML injection Inject 1,000 mcg into the muscle every 30 (thirty) days.        Marland Kitchen esomeprazole (NEXIUM) 40 MG capsule Take 40 mg by mouth daily before breakfast.        . fenofibrate 160 MG tablet Take 160 mg by mouth daily.        Marland Kitchen glimepiride (AMARYL) 4 MG tablet Take 4 mg by mouth daily before breakfast.        . glucose blood test strip 1 each by Other route as needed. Use as instructed       . levothyroxine (SYNTHROID, LEVOTHROID) 112 MCG tablet Take 112 mcg by mouth daily.        . metFORMIN (GLUCOPHAGE) 500 MG tablet Take 2 tablets by mouth two times daily  120 tablet  11  . Multiple Vitamin (MULTIVITAMIN) capsule Take 1 capsule by mouth daily.        . saxagliptin HCl (ONGLYZA) 5 MG TABS tablet Take 1 tablet (5 mg total) by mouth daily. At dinner time   30 tablet  11  . valsartan-hydrochlorothiazide (DIOVAN-HCT) 320-12.5 MG per tablet Take 1 tablet by mouth daily.          Allergies  Allergen Reactions  . Codeine     REACTION: change in mental status  . Diphenhydramine Hcl     REACTION: itching  . Quinine     REACTION: abnormal sensations in face  . Sulfonamide Derivatives     REACTION: rash    Review of  Systems  See HPI - all other systems neg except as noted...       The patient complains of nausea & diarrhea- now resolved.  The patient denies anorexia, fever, weight loss, weight gain, vision loss, decreased hearing, hoarseness, syncope, peripheral edema, prolonged cough, headaches, hemoptysis, abdominal pain, melena, hematochezia, severe indigestion/heartburn, hematuria, incontinence, suspicious skin lesions, transient blindness, depression, unusual weight change, abnormal bleeding, enlarged lymph nodes, and angioedema.     Objective:   Physical Exam      WD, WN, 77 y/o WF in NAD...  VS reviewed & BP= 120/64 w/o postural changes... GENERAL:  Alert & oriented; pleasant & cooperative... HEENT:  [place]/AT, EOM-wnl, PERRLA, EACs-clear, TMs- sl red on left, NOSE-clear, THROAT-clear & wnl. NECK:  Decr ROM; no JVD; normal carotid impulses w/o bruits; no thyromegaly or nodules palpated; no lymphadenopathy. CHEST:  Clear to P & A; without wheezes/ rales/ or rhonchi heard... HEART:  Regular Rhythm; without murmurs/ rubs/ or gallops detected... ABDOMEN:  Soft & nontender; normal bowel sounds; no organomegaly or masses palpated... EXT: without deformities, mild arthritic changes; no varicose veins/ +venous insuffic/ no edema. NEURO:  CN's intact; no focal neuro deficits or weakness found... DERM:  no lesions noted...   Assessment & Plan:   Gastroenteritis>  She appears to have had a gastroenteritis (prob viral) w/ nausea, vomit x1, diarrhea now resolved;  She maintained her appetite & intake of fluids and had solid food today which stayed down "I was hungry";  We discussed gastroenteritis & wrote prescription for Phenergan which her daughter was glad for her to have;  They can use Immodium OTC prn;  Advised to stay well hydrated & call for any questions...  Hypertension>  Her BP remains controlled & specifically her BP is not too low & there are no postural changes;  Continue same meds...  CP>   She was seen 05/13/10 for a routine 30mo check up & daugh mentioned her atypCP & wanted cardiac eval which is pending;  Pt did not mention chest discomfort during this visit but she is 77y/o w/ mult cardiac risk factors & prev EKG w/ poor R progression & NSSTTWA...  Diabetes>  Last visit her BS=290, A1c=10.3 and her Metformin was incr to 2Bid, Glimep kept at 4mg AM, & Onglyza added 5mg Qpm;  She states BS up & down but overall seems sl better over the last week- didn't bring record, asked to check FBS & 4-5PM BS before the meals & record results for our review;  REC ROV in 3-4 weeks to recheck pt, sooner prn...  Other medical problems>  We discussed her Hyperlipidemia, Hypothyroidism, Renal Insuffic and reviewed her meds;  Reminded about diet, exercise, & to stay well hydrated.Marland KitchenMarland Kitchen

## 2010-05-22 NOTE — Patient Instructions (Signed)
Jocelyn Mendez, it looks like you had a gastroenteritis w/ the nausea & diarrhea that is now better...     We wrote a prescription for Phenergan to take for the nausea as needed...    Continue your other meds the same...  Be sure to drink lots of water too...  Let's plan a check-up in 3-4 weeks to make sure everything is going well.Marland KitchenMarland Kitchen

## 2010-05-23 ENCOUNTER — Encounter: Payer: Self-pay | Admitting: Cardiovascular Disease

## 2010-05-23 ENCOUNTER — Encounter: Payer: Self-pay | Admitting: Pulmonary Disease

## 2010-05-23 ENCOUNTER — Other Ambulatory Visit (HOSPITAL_COMMUNITY): Payer: Medicare Other | Admitting: Radiology

## 2010-05-25 ENCOUNTER — Inpatient Hospital Stay (HOSPITAL_COMMUNITY)
Admission: EM | Admit: 2010-05-25 | Discharge: 2010-05-29 | DRG: 683 | Disposition: A | Discharge status: Home / Self Care | Payer: Medicare Other | Attending: Internal Medicine | Admitting: Internal Medicine

## 2010-05-25 ENCOUNTER — Emergency Department (HOSPITAL_COMMUNITY): Payer: Medicare Other

## 2010-05-25 DIAGNOSIS — IMO0001 Reserved for inherently not codable concepts without codable children: Secondary | ICD-10-CM | POA: Diagnosis present

## 2010-05-25 DIAGNOSIS — N390 Urinary tract infection, site not specified: Secondary | ICD-10-CM | POA: Diagnosis present

## 2010-05-25 DIAGNOSIS — D631 Anemia in chronic kidney disease: Secondary | ICD-10-CM | POA: Diagnosis present

## 2010-05-25 DIAGNOSIS — N179 Acute kidney failure, unspecified: Principal | ICD-10-CM | POA: Diagnosis present

## 2010-05-25 DIAGNOSIS — N039 Chronic nephritic syndrome with unspecified morphologic changes: Secondary | ICD-10-CM | POA: Diagnosis present

## 2010-05-25 DIAGNOSIS — K219 Gastro-esophageal reflux disease without esophagitis: Secondary | ICD-10-CM | POA: Diagnosis present

## 2010-05-25 DIAGNOSIS — A498 Other bacterial infections of unspecified site: Secondary | ICD-10-CM | POA: Diagnosis present

## 2010-05-25 DIAGNOSIS — R197 Diarrhea, unspecified: Secondary | ICD-10-CM | POA: Diagnosis present

## 2010-05-25 DIAGNOSIS — I129 Hypertensive chronic kidney disease with stage 1 through stage 4 chronic kidney disease, or unspecified chronic kidney disease: Secondary | ICD-10-CM | POA: Diagnosis present

## 2010-05-25 DIAGNOSIS — E538 Deficiency of other specified B group vitamins: Secondary | ICD-10-CM | POA: Diagnosis present

## 2010-05-25 DIAGNOSIS — N183 Chronic kidney disease, stage 3 unspecified: Secondary | ICD-10-CM | POA: Diagnosis present

## 2010-05-25 DIAGNOSIS — E860 Dehydration: Secondary | ICD-10-CM | POA: Diagnosis present

## 2010-05-25 DIAGNOSIS — E875 Hyperkalemia: Secondary | ICD-10-CM | POA: Diagnosis present

## 2010-05-25 DIAGNOSIS — M129 Arthropathy, unspecified: Secondary | ICD-10-CM | POA: Diagnosis present

## 2010-05-25 DIAGNOSIS — E039 Hypothyroidism, unspecified: Secondary | ICD-10-CM | POA: Diagnosis present

## 2010-05-25 LAB — POCT CARDIAC MARKERS
CKMB, poc: <1 ng/mL — ABNORMAL LOW (ref 1.0–8.0)
Troponin i, poc: <0.05 ng/mL (ref 0.00–0.09)

## 2010-05-25 LAB — URINE MICROSCOPIC-ADD ON

## 2010-05-25 LAB — URINALYSIS, ROUTINE W REFLEX MICROSCOPIC
Bilirubin Urine: NEGATIVE
Bilirubin Urine: NEGATIVE
Glucose, UA: NEGATIVE mg/dL
Hgb urine dipstick: NEGATIVE
Nitrite: NEGATIVE
Protein, ur: NEGATIVE mg/dL
Specific Gravity, Urine: 1.021 (ref 1.005–1.030)
Specific Gravity, Urine: 1.016 (ref 1.005–1.030)
Urobilinogen, UA: 0.2 mg/dL (ref 0.0–1.0)
pH: 5 (ref 5.0–8.0)

## 2010-05-25 LAB — CBC
HCT: 31.3 % — ABNORMAL LOW (ref 36.0–46.0)
Hemoglobin: 9.8 g/dL — ABNORMAL LOW (ref 12.0–15.0)
MCV: 86 fL (ref 78.0–100.0)
RBC: 3.64 MIL/uL — ABNORMAL LOW (ref 3.87–5.11)
RDW: 14.1 % (ref 11.5–15.5)
WBC: 9.5 10*3/uL (ref 4.0–10.5)

## 2010-05-25 LAB — BASIC METABOLIC PANEL
Calcium: 11.2 mg/dL — ABNORMAL HIGH (ref 8.4–10.5)
Creatinine, Ser: 2.64 mg/dL — ABNORMAL HIGH (ref 0.4–1.2)
GFR calc Af Amer: 21 mL/min — ABNORMAL LOW (ref >60)
GFR calc non Af Amer: 18 mL/min — ABNORMAL LOW (ref >60)
Sodium: 133 mEq/L — ABNORMAL LOW (ref 135–145)

## 2010-05-25 LAB — DIFFERENTIAL
Eosinophils Relative: 0 % (ref 0–5)
Lymphocytes Relative: 14 % (ref 12–46)
Lymphs Abs: 1.3 10*3/uL (ref 0.7–4.0)
Monocytes Relative: 13 % — ABNORMAL HIGH (ref 3–12)

## 2010-05-25 LAB — GLUCOSE, CAPILLARY: Glucose-Capillary: 81 mg/dL (ref 70–99)

## 2010-05-25 LAB — MAGNESIUM: Magnesium: 1.7 mg/dL (ref 1.5–2.5)

## 2010-05-26 ENCOUNTER — Inpatient Hospital Stay (HOSPITAL_COMMUNITY): Payer: Medicare Other

## 2010-05-26 ENCOUNTER — Other Ambulatory Visit: Payer: Self-pay | Admitting: Internal Medicine

## 2010-05-26 LAB — CBC
HCT: 27.7 % — ABNORMAL LOW (ref 36.0–46.0)
Hemoglobin: 8.9 g/dL — ABNORMAL LOW (ref 12.0–15.0)
MCH: 27.6 pg (ref 26.0–34.0)
MCHC: 32.1 g/dL (ref 30.0–36.0)
RDW: 14.1 % (ref 11.5–15.5)

## 2010-05-26 LAB — GLUCOSE, CAPILLARY
Glucose-Capillary: 205 mg/dL — ABNORMAL HIGH (ref 70–99)
Glucose-Capillary: 189 mg/dL — ABNORMAL HIGH (ref 70–99)
Glucose-Capillary: 148 mg/dL — ABNORMAL HIGH (ref 70–99)
Glucose-Capillary: 166 mg/dL — ABNORMAL HIGH (ref 70–99)

## 2010-05-26 LAB — COMPREHENSIVE METABOLIC PANEL
BUN: 33 mg/dL — ABNORMAL HIGH (ref 6–23)
CO2: 20 mEq/L (ref 19–32)
Calcium: 9.5 mg/dL (ref 8.4–10.5)
Creatinine, Ser: 2.42 mg/dL — ABNORMAL HIGH (ref 0.4–1.2)
GFR calc non Af Amer: 19 mL/min — ABNORMAL LOW (ref >60)
Glucose, Bld: 236 mg/dL — ABNORMAL HIGH (ref 70–99)
Total Protein: 5.8 g/dL — ABNORMAL LOW (ref 6.0–8.3)

## 2010-05-26 LAB — CREATININE, URINE, RANDOM: Creatinine, Urine: 43.7 mg/dL

## 2010-05-26 LAB — CLOSTRIDIUM DIFFICILE BY PCR: Toxigenic C. Difficile by PCR: NEGATIVE

## 2010-05-26 LAB — SODIUM, URINE, RANDOM: Sodium, Ur: 72 meq/L

## 2010-05-27 ENCOUNTER — Telehealth: Payer: Self-pay | Admitting: Pulmonary Disease

## 2010-05-27 LAB — VITAMIN D 25 HYDROXY (VIT D DEFICIENCY, FRACTURES): Vit D, 25-Hydroxy: 26 ng/mL — ABNORMAL LOW (ref 30–89)

## 2010-05-27 LAB — GLUCOSE, CAPILLARY
Glucose-Capillary: 233 mg/dL — ABNORMAL HIGH (ref 70–99)
Glucose-Capillary: 247 mg/dL — ABNORMAL HIGH (ref 70–99)
Glucose-Capillary: 178 mg/dL — ABNORMAL HIGH (ref 70–99)

## 2010-05-27 LAB — BASIC METABOLIC PANEL
BUN: 25 mg/dL — ABNORMAL HIGH (ref 6–23)
CO2: 20 mEq/L (ref 19–32)
Calcium: 9 mg/dL (ref 8.4–10.5)
Glucose, Bld: 178 mg/dL — ABNORMAL HIGH (ref 70–99)
Potassium: 4.4 mEq/L (ref 3.5–5.1)
Sodium: 138 mEq/L (ref 135–145)

## 2010-05-27 LAB — CBC
HCT: 27.1 % — ABNORMAL LOW (ref 36.0–46.0)
Hemoglobin: 8.7 g/dL — ABNORMAL LOW (ref 12.0–15.0)
MCHC: 32.1 g/dL (ref 30.0–36.0)
MCV: 86.3 fL (ref 78.0–100.0)

## 2010-05-27 LAB — URINE CULTURE

## 2010-05-27 NOTE — Telephone Encounter (Signed)
Per SN--we will cancel the myoview that was ordered by Dr. Aggie Moats to pt being in the hospital.  thanks

## 2010-05-27 NOTE — Telephone Encounter (Signed)
Will send to Leigh to make sure SN is aware of this and see if he wants to [facility] stress test.

## 2010-05-28 LAB — GLUCOSE, CAPILLARY: Glucose-Capillary: 167 mg/dL — ABNORMAL HIGH (ref 70–99)

## 2010-05-28 LAB — PROTEIN ELECTROPHORESIS, SERUM
Albumin ELP: 52.8 % — ABNORMAL LOW (ref 55.8–66.1)
Alpha-2-Globulin: 11.7 % (ref 7.1–11.8)
M-Spike, %: NOT DETECTED g/dL
Total Protein ELP: 6.2 g/dL (ref 6.0–8.3)

## 2010-05-28 LAB — BASIC METABOLIC PANEL
Calcium: 8.9 mg/dL (ref 8.4–10.5)
GFR calc Af Amer: 28 mL/min — ABNORMAL LOW (ref >60)
GFR calc non Af Amer: 23 mL/min — ABNORMAL LOW (ref >60)
Glucose, Bld: 181 mg/dL — ABNORMAL HIGH (ref 70–99)
Potassium: 4.3 mEq/L (ref 3.5–5.1)
Sodium: 138 mEq/L (ref 135–145)

## 2010-05-28 LAB — FECAL LACTOFERRIN, QUANT

## 2010-05-28 LAB — CBC
HCT: 27 % — ABNORMAL LOW (ref 36.0–46.0)
MCHC: 31.9 g/dL (ref 30.0–36.0)
Platelets: 254 10*3/uL (ref 150–400)
RDW: 13.9 % (ref 11.5–15.5)
WBC: 5.3 10*3/uL (ref 4.0–10.5)

## 2010-05-28 LAB — UIFE/LIGHT CHAINS/TP QN, 24-HR UR

## 2010-05-28 NOTE — Telephone Encounter (Signed)
Myoview rescheduled for Tues. 06/11/10 at 9:00 at [facility]. Attempted to contact pt's daughter Estill Bamberg, and no answer. Left message on her answering machine for her to return my call. Rhonda Cobb

## 2010-05-28 NOTE — Telephone Encounter (Signed)
Left detailed msg to let Jocelyn Mendez know test will be cancelled. Msg sent to P[facility]'s.

## 2010-05-29 ENCOUNTER — Telehealth: Payer: Self-pay | Admitting: Pulmonary Disease

## 2010-05-29 LAB — BASIC METABOLIC PANEL
Calcium: 8.6 mg/dL (ref 8.4–10.5)
Chloride: 115 mEq/L — ABNORMAL HIGH (ref 96–112)
Creatinine, Ser: 2.01 mg/dL — ABNORMAL HIGH (ref 0.4–1.2)
GFR calc Af Amer: 29 mL/min — ABNORMAL LOW (ref >60)
Sodium: 141 mEq/L (ref 135–145)

## 2010-05-29 LAB — GLUCOSE, CAPILLARY
Glucose-Capillary: 218 mg/dL — ABNORMAL HIGH (ref 70–99)
Glucose-Capillary: 197 mg/dL — ABNORMAL HIGH (ref 70–99)

## 2010-05-29 LAB — STOOL CULTURE

## 2010-05-29 LAB — PHOSPHORUS: Phosphorus: 3.1 mg/dL (ref 2.3–4.6)

## 2010-05-29 NOTE — Telephone Encounter (Signed)
[email or web address].Jennifer Castillo, CMA  

## 2010-05-30 ENCOUNTER — Ambulatory Visit (HOSPITAL_COMMUNITY): Payer: Medicare Other | Admitting: Radiology

## 2010-05-30 NOTE — Telephone Encounter (Signed)
Spoke w/ pt daughter and she states pt needed hfu w/in 1 week w/ SN. Sn did not havie any openings and pt was okay w/ seeing TP for hfu. She is scheduled 06/07/10 at 3:45. Nothing further was needed

## 2010-05-31 NOTE — Discharge Summary (Signed)
Jocelyn Mendez, Jocelyn Mendez               ACCOUNT NO.:  [id number]  MEDICAL RECORD NO.:  [id number]           PATIENT TYPE:  I  LOCATION:  [id number]                         FACILITY:  [facility]  PHYSICIAN:  Lottie Dawson, MD       DATE OF BIRTH:  11-29-1930  DATE OF ADMISSION:  05/25/2010 DATE OF DISCHARGE:  05/29/2010                              DISCHARGE SUMMARY   PRIMARY CARE PHYSICIAN:  Deborra Medina. Lenna Gilford, MD  DISCHARGE DIAGNOSES: 1. Acute renal failure on chronic kidney disease, stage 3. 2. Hyperkalemia due to acute renal failure. 3. Hyperlipidemia. 4. Type 2 diabetes. 5. Hypothyroidism. 6. Hypercalcemia secondary to acute renal failure, resolved. 7. Gastroesophageal reflux disease. 8. Anxiety. 9. E coli urinary tract infection. 10.Uncontrolled type 2 diabetes. 11.B12 deficiency/pernicious anemia. 12.Arthritis. 13.History of diverticulosis.  DISCHARGE MEDICATIONS: 1. Amlodipine 5 mg p.o. daily. 2. Ciprofloxacin 250 mg p.o. twice daily to be discontinued after     May 30, 2010. 3. Saxagliptin 2.5 mg p.o. daily. 4. Glimepiride 2 mg p.o. daily 5. Aspirin 81 mg p.o. daily. 6. Fenofibrate 160 mg p.o. daily 7. Ferrous sulfate 325 mg p.o. daily. 8. Lipitor 20 mg p.o. q.a.m. 9. Calcium carbonate with vitamin D 1 tablet p.o. twice daily 10.Synthroid 112 mcg p.o. daily 11.Vitamin B12, 1000 units IM q. month. 12.Vitamin D3, 1000 units 2 tablets p.o. daily.  The following medications were discontinued, 1. Metformin 500 mg p.o. twice daily. 2. Valsartan/hydrochlorothiazide 320/12.5 mg p.o. daily.  BRIEF ADMITTING HISTORY AND PHYSICAL:  Jocelyn Mendez is a 77 year old Caucasian female with a history of diabetes, hypertension, hyperlipidemia, chronic kidney disease, hypothyroidism, arthritis, anemia, GERD who presented with complaints of diarrhea for 3 days.  RADIOLOGY/IMAGING:  The patient had abdominal series on May 25, 2010, which showed mild enlargement of cardiac silhouette.  Minimal  left basilar atelectasis.  Nonobstructive bowel gas pattern.  The patient had a renal ultrasound, which was negative for hydronephrosis.  No acute findings noted.  LABORATORY DATA:  CBC shows a white count of 5.3, hemoglobin 8.6, hematocrit 27.0, platelet count 254.  Electrolytes normal with a BUN of 21, creatinine is 2.01.  Initial creatinine on presentation was 2.64. Initial potassium on presentation was 5.8.  Liver function tests normal except total protein is 5.8, albumin is 2.8.  Serum calcium at the time of discharge was 8.6, initially on admission was 11.2.  UA was negative for nitrites, had moderate leukocytes.  Urine culture grew E coli that was pansensitive.  C difficile PCR was negative.  HOSPITAL COURSE BY PROBLEMS: 1. Acute renal failure, likely secondary to diarrhea, medications     including valsartan/hydrochlorothiazide, urinary tract infection.     During the course of hospital stay, diarrhea improved.  The patient     was also gently hydrated during the course of hospital stay.  Her     renal function continued to improve from 2.64 to 2.01 at the time     of discharge.  The patient's creatinine at baseline has been around     1.5, 1.4 in the past.  At the time of discharge, her renal function  was 2.01.  The patient's valsartan and hydrochlorothiazide was     discontinued.  The patient was instructed to hydrate herself     adequately.  The patient also had renal ultrasound with results as     indicated above.  The patient is to follow up with Dr. Lenna Gilford, her     primary care physician, in 1 week to determine to see if she needs     further evaluation. 2. Hyperkalemia, resolved with IV hydration likely secondary to acute     renal failure. 3. Uncontrolled diabetes.  Given her elevation in creatinine, her     saxagliptin and metformin were discontinued.  The patient was on     glimepiride.  At the time of discharge, given her creatinine was     2.0.  She was  instructed to discontinue taking metformin at home     and to resume saxagliptin at half the dose that was taking at 2.5     mg twice daily.  The patient was started on low-dose Lantus at 6     units daily.  Lantus was not continued at the time of discharge.     Further titration of diabetic medications to be done as an     outpatient by her primary care physician, Dr. Lenna Gilford. 4. E coli urinary tract infection.  The patient during the course of     hospital stay was started on ceftriaxone.  At the time of     discharge, she will be on ciprofloxacin, which E coli was sensitive     to, for 1 more day to complete the course of antibiotics. 5. Hypothyroidism.  Continue the patient on levothyroxine. 6. Hypercalcemia, likely secondary to acute renal failure, resolved     with hydration. 7. Vitamin D deficiency.  Continue home medications. 8. Hypertension, given the patient's valsartan and hydrochlorothiazide     were discontinued and the patient was started on amlodipine.     Further titration of antihypertensive medications to be done as an     outpatient. 9. Anemia, likely secondary to chronic disease and chronic kidney     disease.  Hemoglobin stable.  DISPOSITION AND FOLLOWUP:  The patient follow up with Dr. Teressa Lower in 1 week and have her electrolytes checked at the next clinic visit.  The patient was evaluated by Physical Therapy and thought the patient would benefit from home health physical therapy, however, the patient declined saying that she has enough support at home.  Time spent on discharge talking to the patient family and coordinating care was 25 minutes.   Lottie Dawson, MD   SR/MEDQ  D:  05/29/2010  T:  05/30/2010  Job:  [id number]  cc:   Deborra Medina. Lenna Gilford, [place] [address]  Electronically Signed by Alveta Heimlich Sevanna Ballengee  on 05/31/2010 05:09:56 PM

## 2010-06-05 ENCOUNTER — Encounter: Payer: Self-pay | Admitting: Adult Health

## 2010-06-07 ENCOUNTER — Other Ambulatory Visit (INDEPENDENT_AMBULATORY_CARE_PROVIDER_SITE_OTHER): Payer: Medicare Other

## 2010-06-07 ENCOUNTER — Ambulatory Visit (INDEPENDENT_AMBULATORY_CARE_PROVIDER_SITE_OTHER): Payer: Medicare Other | Admitting: Adult Health

## 2010-06-07 ENCOUNTER — Encounter: Payer: Self-pay | Admitting: Adult Health

## 2010-06-07 VITALS — BP 112/60 | HR 82 | Temp 98.1°F | Ht 66.0 in | Wt 147.8 lb

## 2010-06-07 DIAGNOSIS — N259 Disorder resulting from impaired renal tubular function, unspecified: Secondary | ICD-10-CM

## 2010-06-07 DIAGNOSIS — I10 Essential (primary) hypertension: Secondary | ICD-10-CM

## 2010-06-07 DIAGNOSIS — E785 Hyperlipidemia, unspecified: Secondary | ICD-10-CM

## 2010-06-07 DIAGNOSIS — E119 Type 2 diabetes mellitus without complications: Secondary | ICD-10-CM

## 2010-06-07 DIAGNOSIS — K5289 Other specified noninfective gastroenteritis and colitis: Secondary | ICD-10-CM

## 2010-06-07 DIAGNOSIS — K529 Noninfective gastroenteritis and colitis, unspecified: Secondary | ICD-10-CM

## 2010-06-07 LAB — BASIC METABOLIC PANEL
CO2: 22 mEq/L (ref 19–32)
Chloride: 108 mEq/L (ref 96–112)
Creatinine, Ser: 1.8 mg/dL — ABNORMAL HIGH (ref 0.4–1.2)
Potassium: 6 mEq/L — ABNORMAL HIGH (ref 3.5–5.1)
Sodium: 136 mEq/L (ref 135–145)

## 2010-06-07 LAB — HEMOGLOBIN A1C: Hgb A1c MFr Bld: 9.7 % — ABNORMAL HIGH (ref 4.6–6.5)

## 2010-06-07 NOTE — Assessment & Plan Note (Addendum)
Will check Hbg A1c  Cont on current regimen  Adjust meds when labs return  follow up Dr. Lenna Gilford  In 1 month

## 2010-06-07 NOTE — Assessment & Plan Note (Signed)
Recent acute on chronic renal failure with assoicated gastroenteritis/ UTI Will follow up BMET now.  She is off ARB and HCTZ , metformin  Cont to follow

## 2010-06-07 NOTE — Assessment & Plan Note (Signed)
B/P controlled despite recently stopped ARB.  Cont off ARB Check bmet today  Keep b/p log with daily check .

## 2010-06-07 NOTE — Progress Notes (Signed)
Subjective:    Patient ID: Jocelyn Mendez, female    DOB: [birth year], 77 y.o.   MRN: [id number]  HPI  77 y/o WF here for an add-on visit... she has mult med problems including:  HBP, Hypercholesterolemia, DM, mild renal insuffic, & multifactorial anemia w/ B12 defic on shots monthly... she also has DJD, LBP, Osteopenia, etc...   ~  May 22, 2010:  Family call for an urgent add-on appt due to apparent gastroenteritis w/ N V D> nausea x 3-4d w/ fair intake, vomit x1 but keeping fluids down, & diarrhea x 4-5 times daily for several days but stopped last PM & no BM today;  She denies F C S, states appetite is good, just feeling weak & BS are up & down;  otherw situation is as above, they needed a lot of reassurance & support, it does not appear as though the Metformin was causing the diarrhea as it has stopped.>>conservative tx   06/07/10 Post hospital OV  Pt was admitted 4/14-4/18 for acute renal failure and hyperkalemia after acute gastroenteritis and UTI. Urine cx with e. Coli- pansensitive. C diff was neg. She finished course of Cipro. Was given fluid  Hydration. Scr max at 2.6 and at discharge at 2.01 (baseline 2.01). Abd [place] w/ no hydronephrosis. Her metformin and ARB was stopped due to worsening renal fxn.   Since discharge she is feeling better. No n/v/d. Energy level is improving. No urinary symptoms. Eating good. She is on low salt diet.  B/p has been averaging 0000000 systolic.  Blood sugars avg 100-180.    Past Medical History  Diagnosis Date  . Acute bronchitis   . Unspecified essential hypertension   . Chest pain, unspecified   . Type II or unspecified type diabetes mellitus without mention of complication, not stated as uncontrolled   . Other and unspecified hyperlipidemia   . Unspecified hypothyroidism   . Esophageal reflux   . Diverticulosis of colon (without mention of hemorrhage)   . Unspecified disorder resulting from impaired renal function   . Generalized  osteoarthrosis, unspecified site   . Lumbago   . Osteoporosis   . Headache   . Anxiety state, unspecified   . Anemia of other chronic disease   . Other B-complex deficiencies      Past Surgical History  Procedure Date  . Appendectomy     Outpatient Encounter Prescriptions as of 06/07/2010  Medication Sig Dispense Refill  . ALPRAZolam (XANAX) 0.25 MG tablet Take 1 tablet (0.25 mg total) by mouth 3 (three) times daily as needed for anxiety.  90 tablet  5  . amLODipine (NORVASC) 5 MG tablet Take 5 mg by mouth daily.        Marland Kitchen aspirin 81 MG tablet Take 81 mg by mouth daily.        Marland Kitchen atorvastatin (LIPITOR) 20 MG tablet Take 20 mg by mouth daily.        . Calcium Carbonate-Vitamin D 600-400 MG-UNIT per tablet Take 1 tablet by mouth 2 (two) times daily.        . Cholecalciferol (VITAMIN D3) 3000 UNITS TABS Take 2 capsules by mouth daily.       . cyanocobalamin (,VITAMIN B-12,) 1000 MCG/ML injection Inject 1,000 mcg into the muscle every 30 (thirty) days.        Marland Kitchen esomeprazole (NEXIUM) 40 MG capsule Take 40 mg by mouth daily before breakfast.        . fenofibrate 160 MG tablet Take 160 mg by  mouth daily.        Marland Kitchen glimepiride (AMARYL) 2 MG tablet Take 2 mg by mouth daily before breakfast.        . glucose blood test strip 1 each by Other route as needed. Use as instructed       . levothyroxine (SYNTHROID, LEVOTHROID) 112 MCG tablet Take 112 mcg by mouth daily.        . Multiple Vitamin (MULTIVITAMIN) capsule Take 1 capsule by mouth daily.        . saxagliptin HCl (ONGLYZA) 2.5 MG TABS tablet Take 2.5 mg by mouth daily with supper.        Marland Kitchen DISCONTD: glimepiride (AMARYL) 4 MG tablet Take 4 mg by mouth daily before breakfast.        . DISCONTD: metFORMIN (GLUCOPHAGE) 500 MG tablet Take 2 tablets by mouth two times daily  120 tablet  11  . DISCONTD: saxagliptin HCl (ONGLYZA) 5 MG TABS tablet Take 1 tablet (5 mg total) by mouth daily. At dinner time   30 tablet  11  . DISCONTD:  valsartan-hydrochlorothiazide (DIOVAN-HCT) 320-12.5 MG per tablet Take 1 tablet by mouth daily.          Allergies  Allergen Reactions  . Codeine     REACTION: change in mental status  . Diphenhydramine Hcl     REACTION: itching  . Quinine     REACTION: abnormal sensations in face  . Sulfonamide Derivatives     REACTION: rash    Review of Systems     Constitutional:   No  weight loss, night sweats,  Fevers, chills,   HEENT:   No headaches,  Difficulty swallowing,  Tooth/dental problems, or  Sore throat,                No sneezing, itching, ear ache, nasal congestion, post nasal drip,   CV:  No chest pain,  Orthopnea, PND, swelling in lower extremities, anasarca, dizziness, palpitations, syncope.   GI  No heartburn, indigestion, abdominal pain, nausea, vomiting, diarrhea, change in bowel habits, loss of appetite, bloody stools.   Resp:  No change in color of mucus.  No wheezing.  No chest wall deformity  Skin: no rash or lesions.  GU: no dysuria, change in color of urine, no urgency or frequency.    MS:  No joint pain or swelling.  No decreased range of motion.    Psych:  No change in mood or affect. No depression or anxiety.  No memory loss.       Objective:   Physical Exam      WD, WN, 77 y/o WF  GENERAL:  Alert & oriented; pleasant & cooperative... HEENT:  [place]/AT,  EACs-clear, TMs- nml  NOSE-clear, THROAT-clear & wnl. NECK:  Decr ROM; no JVD; normal carotid impulses w/o bruits; no thyromegaly or nodules palpated; no lymphadenopathy. CHEST:  Clear to P & A; without wheezes/ rales/ or rhonchi heard... HEART:  Regular Rhythm; without murmurs/ rubs/ or gallops detected... ABDOMEN:  Soft & nontender; normal bowel sounds; no organomegaly or masses palpated... EXT: without deformities, mild arthritic changes; no varicose veins/ +venous insuffic/ no edema. NEURO:  CN's intact; no focal neuro deficits or weakness found... DERM:  no lesions noted...   Assessment & Plan:

## 2010-06-07 NOTE — Patient Instructions (Signed)
Check blood pressure daily and keep log Check blood sugar daily and keep log Keep on same meds I will call with labs follow up Dr. Lenna Gilford  In 4 weeks and As needed   Please contact office for sooner follow up if symptoms do not improve or worsen or seek emergency care

## 2010-06-07 NOTE — Assessment & Plan Note (Signed)
Resolved

## 2010-06-10 ENCOUNTER — Telehealth: Payer: Self-pay | Admitting: Adult Health

## 2010-06-10 ENCOUNTER — Other Ambulatory Visit: Payer: Self-pay | Admitting: *Deleted

## 2010-06-10 DIAGNOSIS — E119 Type 2 diabetes mellitus without complications: Secondary | ICD-10-CM

## 2010-06-10 MED ORDER — GLIMEPIRIDE 4 MG PO TABS: 4.0000 mg | ORAL_TABLET | ORAL | Status: DC

## 2010-06-10 NOTE — Telephone Encounter (Signed)
Pt son called back I advised him of recs for pt. Per [name] have pt come in tomorrow for repeat BMET. Pt son is aware. Nothing further was needed. Order has been placed in epic for labs to be done tomorrow,.

## 2010-06-10 NOTE — Telephone Encounter (Signed)
ATC x2 NA unable to leave VM. WCB   Need to return on Mond (4/30) for follow up BMET.  Avoid all K+, no supplement , Na+ subsitutes.  Renal fxn is better.  DM not under good control  Need to increase Amaryl 4mg  daily

## 2010-06-10 NOTE — Progress Notes (Signed)
amaryl increased to 4mg  daily per TP and 4.27.12 BMET results.  Pt is aware.

## 2010-06-11 ENCOUNTER — Telehealth: Payer: Self-pay | Admitting: Adult Health

## 2010-06-11 ENCOUNTER — Telehealth (HOSPITAL_COMMUNITY): Payer: Self-pay

## 2010-06-11 ENCOUNTER — Telehealth: Payer: Self-pay | Admitting: Pulmonary Disease

## 2010-06-11 ENCOUNTER — Inpatient Hospital Stay (HOSPITAL_COMMUNITY)
Admission: EM | Admit: 2010-06-11 | Discharge: 2010-06-14 | DRG: 690 | Disposition: A | Discharge status: Home / Self Care | Payer: Medicare Other | Attending: Internal Medicine | Admitting: Internal Medicine

## 2010-06-11 ENCOUNTER — Emergency Department (HOSPITAL_COMMUNITY): Payer: Medicare Other

## 2010-06-11 ENCOUNTER — Other Ambulatory Visit (INDEPENDENT_AMBULATORY_CARE_PROVIDER_SITE_OTHER): Payer: Medicare Other

## 2010-06-11 ENCOUNTER — Other Ambulatory Visit (HOSPITAL_COMMUNITY): Payer: Medicare Other | Admitting: Radiology

## 2010-06-11 DIAGNOSIS — N183 Chronic kidney disease, stage 3 unspecified: Secondary | ICD-10-CM | POA: Diagnosis present

## 2010-06-11 DIAGNOSIS — K219 Gastro-esophageal reflux disease without esophagitis: Secondary | ICD-10-CM | POA: Diagnosis present

## 2010-06-11 DIAGNOSIS — E119 Type 2 diabetes mellitus without complications: Secondary | ICD-10-CM | POA: Diagnosis present

## 2010-06-11 DIAGNOSIS — M129 Arthropathy, unspecified: Secondary | ICD-10-CM | POA: Diagnosis present

## 2010-06-11 DIAGNOSIS — N2589 Other disorders resulting from impaired renal tubular function: Secondary | ICD-10-CM | POA: Diagnosis present

## 2010-06-11 DIAGNOSIS — E875 Hyperkalemia: Secondary | ICD-10-CM | POA: Diagnosis present

## 2010-06-11 DIAGNOSIS — E785 Hyperlipidemia, unspecified: Secondary | ICD-10-CM | POA: Diagnosis present

## 2010-06-11 DIAGNOSIS — N390 Urinary tract infection, site not specified: Principal | ICD-10-CM | POA: Diagnosis present

## 2010-06-11 DIAGNOSIS — E039 Hypothyroidism, unspecified: Secondary | ICD-10-CM | POA: Diagnosis present

## 2010-06-11 DIAGNOSIS — D649 Anemia, unspecified: Secondary | ICD-10-CM | POA: Diagnosis present

## 2010-06-11 DIAGNOSIS — F411 Generalized anxiety disorder: Secondary | ICD-10-CM | POA: Diagnosis present

## 2010-06-11 DIAGNOSIS — R5381 Other malaise: Secondary | ICD-10-CM | POA: Diagnosis present

## 2010-06-11 DIAGNOSIS — E860 Dehydration: Secondary | ICD-10-CM | POA: Diagnosis present

## 2010-06-11 LAB — DIFFERENTIAL
Basophils Absolute: 0.1 10*3/uL (ref 0.0–0.1)
Basophils Relative: 1 % (ref 0–1)
Eosinophils Absolute: 0.2 10*3/uL (ref 0.0–0.7)
Lymphs Abs: 1.2 10*3/uL (ref 0.7–4.0)
Neutro Abs: 5.9 10*3/uL (ref 1.7–7.7)

## 2010-06-11 LAB — BASIC METABOLIC PANEL
BUN: 42 mg/dL — ABNORMAL HIGH (ref 6–23)
CO2: 23 mEq/L (ref 19–32)
Calcium: 12.5 mg/dL — ABNORMAL HIGH (ref 8.4–10.5)
Calcium: 12.4 mg/dL — ABNORMAL HIGH (ref 8.4–10.5)
Chloride: 106 mEq/L (ref 96–112)
GFR calc non Af Amer: 23 mL/min — ABNORMAL LOW (ref >60)
Glucose, Bld: 146 mg/dL — ABNORMAL HIGH (ref 70–99)
Potassium: 5.7 mEq/L — ABNORMAL HIGH (ref 3.5–5.1)
Sodium: 137 mEq/L (ref 135–145)

## 2010-06-11 LAB — URINALYSIS, ROUTINE W REFLEX MICROSCOPIC
Nitrite: NEGATIVE
Protein, ur: NEGATIVE mg/dL
Urobilinogen, UA: 0.2 mg/dL (ref 0.0–1.0)

## 2010-06-11 LAB — URINE MICROSCOPIC-ADD ON

## 2010-06-11 LAB — CBC
Hemoglobin: 10 g/dL — ABNORMAL LOW (ref 12.0–15.0)
MCH: 27.2 pg (ref 26.0–34.0)
MCHC: 31.4 g/dL (ref 30.0–36.0)
MCV: 86.4 fL (ref 78.0–100.0)
Platelets: 292 10*3/uL (ref 150–400)

## 2010-06-11 LAB — GLUCOSE, CAPILLARY: Glucose-Capillary: 165 mg/dL — ABNORMAL HIGH (ref 70–99)

## 2010-06-11 NOTE — Telephone Encounter (Signed)
Called, spoke with pt.  She is aware amaryl rx sent to [facility].  Son should go by there to pick up.  She verbalized understanding of this.

## 2010-06-11 NOTE — Telephone Encounter (Signed)
Pt's son Marden Noble had come up here to the office.   4.27.12 after pt left the office, the order form for pt's diabetic testing supplies 3x daily was faxed to [facility].  However, pt's son stated that the pharmacy had never received this order and they had to take pt to the ER for hyperglycemia - Marden Noble states that pt had just picked up the amaryl rx this morning.  Called spoke with pharmacist who states that they did not receive the fax.  i refaxed the form to the verified pharmacy fax #.  Maryann Conners called spoke with the pharmacy and verified that the form was received this time.  Marden Noble is aware and i apologized for the inconvenience.

## 2010-06-11 NOTE — Telephone Encounter (Signed)
The patient was a no show for lexiscan myoview today. I spoke with the patient, and she was discharged from the hospital 05/30/10 due to acute renal failure/uncontrolled diabetes. She told me that she is too weak to come for test now, and she has a follow-up ov with Dr. Lenna Gilford. Dr.Nishan's nurse notified by in basket. Delcie Ruppert,RN.

## 2010-06-11 NOTE — Telephone Encounter (Signed)
Pt was at this appt 06/11/10@9 :00am

## 2010-06-12 LAB — MAGNESIUM: Magnesium: 1.8 mg/dL (ref 1.5–2.5)

## 2010-06-12 LAB — DIFFERENTIAL
Eosinophils Absolute: 0.1 10*3/uL (ref 0.0–0.7)
Lymphocytes Relative: 28 % (ref 12–46)
Monocytes Absolute: 0.5 10*3/uL (ref 0.1–1.0)
Monocytes Relative: 10 % (ref 3–12)
Neutrophils Relative %: 58 % (ref 43–77)

## 2010-06-12 LAB — URINE CULTURE

## 2010-06-12 LAB — COMPREHENSIVE METABOLIC PANEL
AST: 20 U/L (ref 0–37)
BUN: 41 mg/dL — ABNORMAL HIGH (ref 6–23)
CO2: 21 mEq/L (ref 19–32)
Calcium: 11.1 mg/dL — ABNORMAL HIGH (ref 8.4–10.5)
Chloride: 105 mEq/L (ref 96–112)
Creatinine, Ser: 2.11 mg/dL — ABNORMAL HIGH (ref 0.4–1.2)
GFR calc non Af Amer: 23 mL/min — ABNORMAL LOW (ref >60)
Glucose, Bld: 115 mg/dL — ABNORMAL HIGH (ref 70–99)
Total Bilirubin: 0.3 mg/dL (ref 0.3–1.2)

## 2010-06-12 LAB — GLUCOSE, CAPILLARY
Glucose-Capillary: 101 mg/dL — ABNORMAL HIGH (ref 70–99)
Glucose-Capillary: 171 mg/dL — ABNORMAL HIGH (ref 70–99)
Glucose-Capillary: 163 mg/dL — ABNORMAL HIGH (ref 70–99)
Glucose-Capillary: 192 mg/dL — ABNORMAL HIGH (ref 70–99)

## 2010-06-12 LAB — CK TOTAL AND CKMB (NOT AT ARMC)
CK, MB: 1.7 ng/mL (ref 0.3–4.0)
Relative Index: INVALID (ref 0.0–2.5)
Relative Index: INVALID (ref 0.0–2.5)
Total CK: 52 U/L (ref 7–177)

## 2010-06-12 LAB — IRON AND TIBC
Iron: 19 ug/dL — ABNORMAL LOW (ref 42–135)
TIBC: 373 ug/dL (ref 250–470)

## 2010-06-12 LAB — CBC
HCT: 30.3 % — ABNORMAL LOW (ref 36.0–46.0)
Hemoglobin: 9.6 g/dL — ABNORMAL LOW (ref 12.0–15.0)
MCH: 27.5 pg (ref 26.0–34.0)
Platelets: 243 10*3/uL (ref 150–400)
RBC: 3.49 MIL/uL — ABNORMAL LOW (ref 3.87–5.11)
WBC: 4.7 10*3/uL (ref 4.0–10.5)

## 2010-06-12 LAB — VITAMIN D 25 HYDROXY (VIT D DEFICIENCY, FRACTURES): Vit D, 25-Hydroxy: 32 ng/mL (ref 30–89)

## 2010-06-12 LAB — CREATININE, URINE, RANDOM: Creatinine, Urine: 47.9 mg/dL

## 2010-06-12 LAB — OSMOLALITY, URINE: Osmolality, Ur: 246 mOsm/kg — ABNORMAL LOW (ref 390–1090)

## 2010-06-12 LAB — PROTIME-INR
INR: 1.36 (ref 0.00–1.49)
Prothrombin Time: 17 seconds — ABNORMAL HIGH (ref 11.6–15.2)

## 2010-06-12 LAB — TROPONIN I
Troponin I: <0.3 ng/mL (ref <0.30)
Troponin I: <0.3 ng/mL (ref <0.30)

## 2010-06-12 LAB — HEMOCCULT GUIAC POC 1CARD (OFFICE): Fecal Occult Bld: NEGATIVE

## 2010-06-12 LAB — APTT: aPTT: 34 seconds (ref 24–37)

## 2010-06-12 LAB — TSH: TSH: 0.926 u[IU]/mL (ref 0.350–4.500)

## 2010-06-12 NOTE — H&P (Signed)
Jocelyn Mendez, Jocelyn Mendez               ACCOUNT NO.:  [id number]  MEDICAL RECORD NO.:  [id number]           PATIENT TYPE:  E  LOCATION:  WLED                         FACILITY:  [facility]  PHYSICIAN:  Arlyss Repress, MD        DATE OF BIRTH:  02-09-1931  DATE OF ADMISSION:  05/25/2010 DATE OF DISCHARGE:                             HISTORY & PHYSICAL   HISTORY OF PRESENT ILLNESS:  77 year old female with a history of diabetes type 2, hypertension, hyperlipidemia, renal insufficiency, hypothyroidism, arthritis, anemia, GERD, apparently presents with complaints of a diarrhea for 3 days.  She describes the diarrhea as pure water and apparently has been going on about four times a day.  She took Imodium today and it has slowed down and she has not had a bowel movement since.  And, she has complained of nausea, vomiting x1 but no blood.  The patient denies any antibiotic use in the last 2 months.  She denies any NSAID use.  She had a colonoscopy in the remote past which she describes as negative.  The patient denies any flagrant abdominal pain but does describe some suprapubic discomfort.  The patient denies any fever, chills, constipation, bright red blood per rectum, black stool, dysuria, hematuria.  There is no prior history of C difficile. The patient has not been recently hospitalized.  The patient presented to the ED for evaluation because her daughter thought she looked pale.  An abdominal flat and upright was done and showed nonobstructive bowel gas pattern and minimal left basilar atelectasis.  Lab work showed that she was dehydrated and in acute renal failure with a BUN and creatinine of 39 and 2.64.  Her potassium was elevated at 5.8.  Her calcium was elevated as well.  The patient's urinalysis showed evidence of a urinary tract infection.  She was noted to be anemic with hemoglobin of 9.8.  The patient will be admitted for acute renal failure, hyperkalemia, urinary tract infection, and  workup of diarrhea.  PAST MEDICAL HISTORY: 1. Diabetes type 2. 2. Hypertension. 3. Hyperlipidemia. 4. Renal insufficiency (previously creatinine 1.7 to 2.0 as documented     in discharge summary February 19, 2006). 5. GERD. 6. Anemia. 7. B12 deficiency/pernicious anemia. 8. Hypothyroidism. 9. Arthritis. 10.Anxiety. 11.Diverticulosis.  PAST SURGICAL HISTORY: 1. Appendectomy. 2. Neck surgery. 3. Colonoscopy, diverticulosis, previously by Dr. Delfin Edis.  SOCIAL HISTORY:  The patient lives with her son.  She does not smoke or drink.  CODE STATUS:  Full Code.  FAMILY HISTORY:  Mother died at age 73 of a hemorrhagic stroke secondary to a brain aneurysm.  Father died of unknown causes.  ALLERGIES:  QUININE, BENADRYL, CODEINE, and SULFA.  MEDICATIONS:  As per pulmonary office note November 12, 2009: 1. Enteric-coated aspirin 81 mg p.o. daily. 2. Diovan hydrochlorothiazide 320/12.5 mg p.o. daily. 3. Lipitor 20 mg p.o. at bedtime. 4. Fenofibrate 160 mg p.o. daily. 5. Metformin 500 mg p.o. b.i.d. 6. Amaryl 2 mg p.o. daily. 7. Synthroid 112 mcg p.o. daily. 8. Nexium 40 mg p.o. daily. 9. Caltrate plus D one p.o. b.i.d. 10.B12 1000 mcg IM every month.  11.Vitamin D 2000 international units p.o. daily. 12.Ferrous sulfate 325 mg p.o. daily.  REVIEW OF SYSTEMS:  Negative for all 10 organ systems except for pertinent positives stated above.  PHYSICAL EXAM:  VITAL SIGNS:  Temperature 97.5, pulse 95, blood pressure 105/65, pulse ox is 96% on room air.  HEENT:  Anicteric.  Mucous membranes dry.  NECK:  No JVD, no bruit.  HEART:  Regular rate and rhythm.  S1, S2.  LUNGS: Clear to auscultation bilaterally.  ABDOMEN: Soft, nontender, nondistended.  Positive bowel sounds.  EXTREMITIES:  No cyanosis, clubbing or edema.  SKIN: No rashes.  LYMPH NODES:  No adenopathy.  NEUROLOGIC:  Exam nonfocal.  LABORATORY DATA:  Urinalysis WBCs 7-10.  Sodium 133, potassium 5.8 (high), BUN 39,  creatinine 2.64.  WBC 9.5, hemoglobin 9.8, platelet count 285.  May 13, 2010, hemoglobin A1c 10.3.  ASSESSMENT AND PLAN: 1. Acute renal failure likely secondary to diarrhea with Diovan-     hydrochlorothiazide being an exacerbating factor.  We will hold the     Diovan-hydrochlorothiazide and hydrate gently with normal saline     IV.  We will check a renal ultrasound.  Check a serum protein     electrophoresis, urine protein electrophoresis, and a urine sodium,     urine creatinine, urine eosinophils. 2. Hyperkalemia:  1 a.m. calcium gluconate IV x1; 1 a.m. sodium bicarb     IV x1; Kayexalate 30 g p.o. x1.  Discontinue Diovan for now. 3. Hyperlipidemia:  We will discontinue the fenofibrate at 160 mg p.o.     daily.  Continue Lipitor 20 mg p.o. at bedtime.  When the patient     is restarted, if creatinine does not fall to normal then I would     renally dose the fenofibrate. 4. Diabetes type 2:  Reduce the saxagliptin to 2.5 mg p.o. daily,     continue glimepiride.  Discontinue metformin. 5. Hypothyroidism:  Continue levothyroxine at 112 mcg p.o. daily. 6. Hypercalcemia:  Check vitamin D 1, 25-OH, PTH, PTH-related peptide,     serum protein electrophoresis, urine protein electrophoresis,     magnesium, phosphorus.  Discontinue calcium plus D for the present     time as well as vitamin D. 7. Gastroesophageal reflux disease:  Continue Nexium. 8. Anxiety:  Continue Xanax as needed. 9. Urinary tract infection:  Ceftriaxone 1 g IV daily.     Arlyss Repress, MD     JYK/MEDQ  D:  05/25/2010  T:  05/25/2010  Job:  [id number]  cc:   Deborra Medina. Lenna Gilford, [place] [address]  Electronically Signed by Jani Gravel MD on 06/12/2010 11:35:04 PM

## 2010-06-13 ENCOUNTER — Encounter: Payer: Self-pay | Admitting: *Deleted

## 2010-06-13 LAB — BASIC METABOLIC PANEL
CO2: 22 mEq/L (ref 19–32)
Calcium: 9.9 mg/dL (ref 8.4–10.5)
Creatinine, Ser: 2.03 mg/dL — ABNORMAL HIGH (ref 0.4–1.2)
GFR calc Af Amer: 29 mL/min — ABNORMAL LOW (ref >60)
GFR calc non Af Amer: 24 mL/min — ABNORMAL LOW (ref >60)
Glucose, Bld: 172 mg/dL — ABNORMAL HIGH (ref 70–99)

## 2010-06-13 LAB — HEMOCCULT GUIAC POC 1CARD (OFFICE): Fecal Occult Bld: NEGATIVE

## 2010-06-13 LAB — GLUCOSE, CAPILLARY

## 2010-06-13 LAB — PTH, INTACT AND CALCIUM
Calcium, Total (PTH): 10.5 mg/dL (ref 8.4–10.5)
PTH: 13.4 pg/mL — ABNORMAL LOW (ref 14.0–72.0)

## 2010-06-13 NOTE — Progress Notes (Signed)
Unable to addend result note from 5.1.12 > renal referral not placed in EPIC d/t pt being at the ER at the time her son was in the office and a subsequent admission.  Will order referral when pt is discharged.  Staff message sent to myself as reminder.    Result Notes     Notes Recorded by Lulu Riding, MA on 06/11/2010 at 5:07 PM Pt son is aware of lab results as stated by TP.  Marden Noble (son) verbalized his understanding.  He will bring pt back for labs 5.5.12 early morning for repeat bmet.  Order placed in epic. ------  Notes Recorded by Rexene Edison, NP on 06/11/2010 at 4:55 PM Kidney fxn is still elevated,  Calcium and Potassium are still up  Need to refer to renal asap She need to stop her multivitamin anything with potassium supplement Hold calcium for now.  It looks like she is not drinking enough fluids make sure she is drinking enough water.  Want to make sure she drinking fluids and come in on Friday may 5 th for repeat bmet.

## 2010-06-13 NOTE — Progress Notes (Signed)
  Subjective:    Patient ID: Jocelyn Mendez, female    DOB: 1930/12/21, 77 y.o.   MRN: [id number]  HPI    Review of Systems     Objective:   Physical Exam        Assessment & Plan:

## 2010-06-14 ENCOUNTER — Other Ambulatory Visit: Payer: Medicare Other

## 2010-06-14 LAB — GLUCOSE, CAPILLARY
Glucose-Capillary: 205 mg/dL — ABNORMAL HIGH (ref 70–99)
Glucose-Capillary: 167 mg/dL — ABNORMAL HIGH (ref 70–99)

## 2010-06-19 NOTE — Discharge Summary (Signed)
Jocelyn Mendez, Jocelyn Mendez               ACCOUNT NO.:  [id number]  MEDICAL RECORD NO.:  [id number]           PATIENT TYPE:  I  LOCATION:  [id number]                         FACILITY:  [facility]:  Leana Gamer, MDDATE OF BIRTH:  [birth year]  DATE OF ADMISSION:  06/11/2010 DATE OF DISCHARGE:  06/14/2010                              DISCHARGE SUMMARY   REFERRING PHYSICIAN:  Collier Salina C. Johnsie Cancel, MD, [facility]  DISCHARGE DISPOSITION:  Home.  FINAL DISCHARGE DIAGNOSES: 1. Suspected urinary tract infection, however, cultures are negative. 2. Generalized weakness, improved. 3. Hypercalcemia, likely secondary to dehydrated state, improved. 4. 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 5. Diabetes type 2. 6. Chronic kidney disease stage 3 at baseline. 7. Hyperlipidemia. 8. Hypothyroidism. 9. Gastroesophageal reflux disease. 10.Anxiety. 11.Chronic anemia.  DISCHARGE MEDICATIONS:  Discharge medications include the following: 1. Amaryl 4 mg p.o. daily. 2. Amlodipine 5 mg p.o. daily. 3. Aspirin enteric-coated 81 mg p.o. daily. 4. Fenofibrate 160 mg p.o. daily. 5. Iron sulfate 325 mg p.o. daily. 6. Lipitor 20 mg p.o. daily. 7. Os-Cal with vitamin D 1 tab p.o. b.i.d. 8. Saxagliptin 2.5 mg p.o. daily. 9. Synthroid 112 mcg p.o. daily. 10.Vitamin B12 of 1000 mcg injected intramuscularly monthly. 11.Vitamin D3 of 1000 units 2 tablets p.o. daily.  CONSULTANTS:  None.  PROCEDURES:  None.  DIAGNOSTIC STUDIES:  Two-view chest x-ray on admission which shows stable cardiopulmonary appearance with no new focal or acute abnormality noted.  CODE STATUS:  Full code.  PRIMARY CARE PHYSICIAN:  Jocelyn Medina. Lenna Gilford, MD  ALLERGIES: 1. CODEINE. 2. QUININE. 3. DIPHENHYDRAMINE. 4. SULFA.  CHIEF COMPLAINT:  Feeling unwell.  HISTORY OF PRESENT ILLNESS:  Please refer to the H and P by Dr. Roel Cluck for details of the HPI.  However, in  short, this is a 77 year old lady with a history of diabetes, recent urinary tract infection, and dehydration who presents to the emergency room feeling poorly.  HOSPITAL COURSE: 1. Suspected urinary tract infection.  The patient was feeling poorly     and had a history of recurrent urinary tract infection.  She was     suspected of having an urinary tract infection, was started     empirically on Rocephin to treat the urinary tract infection.  The     cultures, however, proved to be negative and the antibiotics were     discontinued.  The patient did have some features of dehydration     and was given some fluid resuscitation.  After fluid resuscitation,     the patient was able to maintain her hydration by oral intake of     fluids. 2. Diabetes type 2.  The patient has known diabetes type 2 with blood     sugars ranging in the 200s.  The patient had been on Amaryl and     saxagliptin at home.  Hemoglobin A1c was not done on this     admission.  The patient is resumed on her usual medications and I     will defer to her primary care physician  to follow with titrating     her medications. 3. Hypercalcemia.  The patient was found to have hypercalcemia upon     arrival to the hospital.  What was impressive was a total calcium     of 12.  Her ionized calcium was also elevated at 1.6 initially.  It     was felt that this was partially due to dehydration and after being     rehydrated, it was rechecked.  The calcium is now down to normal     limits with the ionized calcium only mildly elevated at 1.42.     Vitamin D studies were ordered on the patient, the results are not     yet back.  However, I believe that this is primarily due to     dehydration and likely exogenous calcium intake. 4. Hyperkalemia.  The patient had a prehospital diagnosis of     hyperkalemia and is unclear as to the cause of this.  The patient     does have chronic kidney disease stage 3.  However, I do believe      that due to her diabetes, the patient may have a renal tubular     acidosis stage 4 with resultant hyperkalemia.  Nevertheless, the     patient has been given a low-potassium diet here and she has been     instructed on diet restrictions with nutrition staff here.  The     patient has also been given written food list and instructions on     maintaining a low-potassium diet.  I would recommend that the     patient follow up with her primary care physician within 1 week to     check her potassium.  If indeed the patient's potassium does     continue to rise, I would recommend that the patient be placed on     chronic Kayexalate in order to chelate the potassium on an ongoing     basis.  Furthermore, the patient may benefit from a nephrology     consult as an outpatient if she does indeed continue to have     hyperkalemia and hypercalcemia. 5. In terms of her hypertension, this is well controlled with her     medications.  The patient was seen by Physical Therapy and     Occupational Therapy here in the hospital who evaluated her and     their recommendations are that she should go home with home health     or outpatient therapy.  CONDITION ON DISCHARGE:  Condition at the time of discharge is stable.  DISCHARGE PHYSICAL EXAMINATION:  GENERAL:  The patient is well appearing. VITAL SIGNS:  Temperature is 97.6, heart rate 63, blood pressure 128/58, respiratory rate 16, O2 saturations of 98% on room air. HEENT EXAMINATION:  She is normocephalic, atraumatic.  Pupils are equally round and reactive to light and accommodation.  Extraocular movements are intact.  Oropharynx is moist.  No exudate, erythema, or lesions are noted. NECK EXAMINATION:  Trachea is midline.  No masses, no thyromegaly, no JVD, no carotid bruit.  Oropharynx is moist.  No exudate, erythema, lesions noted. RESPIRATORY EXAMINATION:  The patient has a normal respiratory effort, equal excursion bilaterally.  No wheezing or  rhonchi noted. CARDIOVASCULAR:  She has got normal S1 and S2.  No murmurs, rubs, or gallops are noted.  PMI is nondisplaced.  No heaves or thrills on palpation. ABDOMEN:  Obese, soft, nontender, nondistended.  No masses, no  hepatosplenomegaly is noted.  DIETARY RESTRICTIONS:  The patient should be on a diabetic, low- potassium, low-sodium diet.  PHYSICAL RESTRICTIONS:  None.  FOLLOWUP:  The patient is to follow up with Dr. Lenna Mendez within 1 week.  I would recommend she has her electrolytes checked within 1 week with recommendations for outpatient Nephrology consult if she continues to be hyperkalemic.  Total time for this discharge process including face-to-face time:  37 minutes approximately.     Leana Gamer, MD     MAM/MEDQ  D:  06/14/2010  T:  06/14/2010  Job:  [id number]  cc:   Farris Has, MD  Jocelyn Mendez, [place] [address]  Peter [address] Johnsie Cancel, [place], [address] [place] [address]  Electronically Signed by Liston Alba MD on 06/19/2010 02:29:18 PM

## 2010-06-21 ENCOUNTER — Inpatient Hospital Stay: Payer: Medicare Other | Admitting: Adult Health

## 2010-06-24 ENCOUNTER — Ambulatory Visit (INDEPENDENT_AMBULATORY_CARE_PROVIDER_SITE_OTHER): Payer: Medicare Other | Admitting: Adult Health

## 2010-06-24 ENCOUNTER — Other Ambulatory Visit (INDEPENDENT_AMBULATORY_CARE_PROVIDER_SITE_OTHER): Payer: Medicare Other

## 2010-06-24 ENCOUNTER — Encounter: Payer: Self-pay | Admitting: Adult Health

## 2010-06-24 VITALS — BP 140/60 | HR 79 | Temp 96.7°F | Ht 66.0 in | Wt 148.0 lb

## 2010-06-24 DIAGNOSIS — E538 Deficiency of other specified B group vitamins: Secondary | ICD-10-CM

## 2010-06-24 DIAGNOSIS — E119 Type 2 diabetes mellitus without complications: Secondary | ICD-10-CM

## 2010-06-24 DIAGNOSIS — N259 Disorder resulting from impaired renal tubular function, unspecified: Secondary | ICD-10-CM

## 2010-06-24 DIAGNOSIS — I10 Essential (primary) hypertension: Secondary | ICD-10-CM

## 2010-06-24 LAB — BASIC METABOLIC PANEL
BUN: 43 mg/dL — ABNORMAL HIGH (ref 6–23)
CO2: 22 mEq/L (ref 19–32)
Chloride: 103 mEq/L (ref 96–112)
Creatinine, Ser: 1.4 mg/dL — ABNORMAL HIGH (ref 0.4–1.2)
Glucose, Bld: 156 mg/dL — ABNORMAL HIGH (ref 70–99)

## 2010-06-24 MED ORDER — SAXAGLIPTIN HCL 2.5 MG PO TABS: 2.5000 mg | ORAL_TABLET | Freq: Every day | ORAL | Status: DC

## 2010-06-24 MED ORDER — AMLODIPINE BESYLATE 5 MG PO TABS: 5.0000 mg | ORAL_TABLET | Freq: Every day | ORAL | Status: DC

## 2010-06-24 MED ORDER — CYANOCOBALAMIN 1000 MCG/ML IJ SOLN
1000.0000 ug | Freq: Once | INTRAMUSCULAR | Status: AC
  Administered 2010-06-24: 1000 ug via INTRAMUSCULAR

## 2010-06-24 NOTE — Assessment & Plan Note (Signed)
b12 shot today. 

## 2010-06-24 NOTE — Patient Instructions (Addendum)
Continue on currents meds.  follow up Dr. Lenna Gilford  As planned in 2 weeks.  Low sweet and cholestrol diet.  We are referring you to kidney specialist.  Check sugar daily in am before eating, bring log to each visit.

## 2010-06-24 NOTE — Assessment & Plan Note (Signed)
Restart Norvasc , rx sent to pharm.

## 2010-06-24 NOTE — Assessment & Plan Note (Addendum)
Refer to nephrology today bmet pending.  follow up calcium and potassium

## 2010-06-24 NOTE — Assessment & Plan Note (Signed)
Will cont current regimen.  Follow closely  No changes at this time

## 2010-06-24 NOTE — Progress Notes (Signed)
Addended by: Doroteo Glassman on: 06/24/2010 11:04 AM   Modules accepted: Orders

## 2010-06-24 NOTE — Progress Notes (Signed)
Subjective:    Patient ID: Jocelyn Mendez, female    DOB: [birth year], 77 y.o.   MRN: [id number]  HPI  77 y/o WF here for an add-on visit... she has mult med problems including:  HBP, Hypercholesterolemia, DM, mild renal insuffic, & multifactorial anemia w/ B12 defic on shots monthly... she also has DJD, LBP, Osteopenia, etc...   ~  May 22, 2010:  Family call for an urgent add-on appt due to apparent gastroenteritis w/ N V D> nausea x 3-4d w/ fair intake, vomit x1 but keeping fluids down, & diarrhea x 4-5 times daily for several days but stopped last PM & no BM today;  She denies F C S, states appetite is good, just feeling weak & BS are up & down;  otherw situation is as above, they needed a lot of reassurance & support, it does not appear as though the Metformin was causing the diarrhea as it has stopped.>>conservative tx   06/07/10 Post hospital OV  Pt was admitted 4/14-4/18 for acute renal failure and hyperkalemia after acute gastroenteritis and UTI. Urine cx with e. Coli- pansensitive. C diff was neg. She finished course of Cipro. Was given fluid  Hydration. Scr max at 2.6 and at discharge at 2.01 (baseline 2.01). Abd [place] w/ no hydronephrosis. Her metformin and ARB was stopped due to worsening renal fxn.   Since discharge she is feeling better. No n/v/d. Energy level is improving. No urinary symptoms. Eating good. She is on low salt diet.  B/p has been averaging 0000000 systolic.  Blood sugars avg 100-180.   06/24/10 [facility] Pt returns for a hospital follow up. PT was readmitted with weakness. She was ruled out for UTI. She did have persistent hypercalcemia secondary to dehydration . And persistent Hyperkalemia secondary to renal tubular acidosis along with CKD-stage 3 .  She did improved with rehydration.   Since discharge she is feeling better, and stronger. Eating and drinking well.  Walking in yard for exercise.   We discussed with her daughter and pt that she will need  referral to nephrology to eval. Kidneys.   Sugars at home avg 150-200.    Past Medical History  Diagnosis Date  . Acute bronchitis   . Unspecified essential hypertension   . Chest pain, unspecified   . Type II or unspecified type diabetes mellitus without mention of complication, not stated as uncontrolled   . Other and unspecified hyperlipidemia   . Unspecified hypothyroidism   . Esophageal reflux   . Diverticulosis of colon (without mention of hemorrhage)   . Unspecified disorder resulting from impaired renal function   . Generalized osteoarthrosis, unspecified site   . Lumbago   . Osteoporosis   . Headache   . Anxiety state, unspecified   . Anemia of other chronic disease   . Other B-complex deficiencies      Past Surgical History  Procedure Date  . Appendectomy     Outpatient Encounter Prescriptions as of 06/24/2010  Medication Sig Dispense Refill  . ALPRAZolam (XANAX) 0.25 MG tablet Take 1 tablet (0.25 mg total) by mouth 3 (three) times daily as needed for anxiety.  90 tablet  5  . aspirin 81 MG tablet Take 81 mg by mouth daily.        Marland Kitchen atorvastatin (LIPITOR) 20 MG tablet Take 20 mg by mouth daily.        . Cholecalciferol (VITAMIN D3) 3000 UNITS TABS Take 2 capsules by mouth daily.       Marland Kitchen  cyanocobalamin (,VITAMIN B-12,) 1000 MCG/ML injection Inject 1,000 mcg into the muscle every 30 (thirty) days.        Marland Kitchen esomeprazole (NEXIUM) 40 MG capsule Take 40 mg by mouth daily before breakfast.        . fenofibrate 160 MG tablet Take 160 mg by mouth daily.        . ferrous sulfate 325 (65 FE) MG EC tablet Take 325 mg by mouth daily with breakfast.        . glimepiride (AMARYL) 4 MG tablet Take 1 tablet (4 mg total) by mouth every morning.  30 tablet  5  . glucose blood test strip 1 each by Other route as needed. Use as instructed       . levothyroxine (SYNTHROID, LEVOTHROID) 112 MCG tablet Take 112 mcg by mouth daily.        . saxagliptin HCl (ONGLYZA) 2.5 MG TABS tablet Take  2.5 mg by mouth daily with supper.        Marland Kitchen amLODipine (NORVASC) 5 MG tablet Take 5 mg by mouth daily.        . Calcium Carbonate-Vitamin D 600-400 MG-UNIT per tablet Take 1 tablet by mouth 2 (two) times daily.        . Multiple Vitamin (MULTIVITAMIN) capsule Take 1 capsule by mouth daily.        Marland Kitchen DISCONTD: glimepiride (AMARYL) 2 MG tablet Take 2 mg by mouth daily before breakfast.          Allergies  Allergen Reactions  . Codeine     REACTION: change in mental status  . Diphenhydramine Hcl     REACTION: itching  . Quinine     REACTION: abnormal sensations in face  . Sulfonamide Derivatives     REACTION: rash    Review of Systems     Constitutional:   No  weight loss, night sweats,     HEENT:   No headaches,  Difficulty swallowing,  Tooth/dental problems, or  Sore throat,                No sneezing, itching, ear ache, nasal congestion, post nasal drip,   CV:  No chest pain,  Orthopnea, PND, swelling in lower extremities, anasarca, dizziness, palpitations, syncope.   GI  No heartburn, indigestion, abdominal pain, nausea, vomiting, diarrhea, change in bowel habits, loss of appetite, bloody stools.   Resp:  No change in color of mucus.  No wheezing.  No chest wall deformity  Skin: no rash or lesions.  GU: no dysuria, change in color of urine, no urgency or frequency.    MS:  No joint pain or swelling.  No decreased range of motion.    Psych:  No change in mood or affect. No depression or anxiety.          Objective:   Physical Exam      WD, WN, 77 y/o WF  GENERAL:  Alert & oriented; pleasant & cooperative... HEENT:  [place]/AT,  EACs-clear, TMs- nml  NOSE-clear, THROAT-clear & wnl. NECK:  Decr ROM; no JVD; normal carotid impulses w/o bruits; no thyromegaly or nodules palpated; no lymphadenopathy. CHEST:  Clear to P & A; without wheezes/ rales/ or rhonchi heard... HEART:  Regular Rhythm; without murmurs/ rubs/ or gallops detected... ABDOMEN:  Soft & nontender; normal  bowel sounds; no organomegaly or masses palpated... EXT: without deformities, mild arthritic changes; no varicose veins/ +venous insuffic/ no edema. NEURO:  CN's intact; no focal neuro deficits or weakness found.Marland KitchenMarland Kitchen  DERM:  no lesions noted...   Assessment & Plan:

## 2010-06-27 ENCOUNTER — Encounter: Payer: Self-pay | Admitting: Pulmonary Disease

## 2010-06-28 NOTE — H&P (Signed)
Jocelyn Mendez, Jocelyn Mendez               ACCOUNT NO.:  [id number]   MEDICAL RECORD NO.:  [id number]          PATIENT TYPE:  EMS   LOCATION:  [facility]                         FACILITY:  [facility]   PHYSICIAN:  Kathee Delton, MD,FCCPDATE OF BIRTH:  08/21/32   DATE OF ADMISSION:  02/16/2006  DATE OF DISCHARGE:                              HISTORY & PHYSICAL   HISTORY OF PRESENT ILLNESS:  The patient is a very pleasant 77 year old  white female with a history of diabetes who comes in today for  hypoglycemia.  The patient was in her usual state of health until today  when she began to feel lightheaded, as well as foggy headed and was  noted to have a blood sugars of approximately 40 by her Glucometer.  This occurred 2 more times today and, therefore, she was brought to the  emergency room where her blood sugar was verified to be in the range of  40-50.  Patient states that she had been eating quite well at home, but  missed breakfast this morning.  She denies any fevers, chills, sweats or  other possible source of infection.  She is not short of breath at this  time, nor does she have any chest pain and feels that she is back to her  usual self after eating.  Because the patient has had multiple readings  that were quite low today, as well as being on multiple oral  hypoglycemics, she will need to come in for further monitoring.   PAST MEDICAL HISTORY:  Significant for:  1. Diabetes.  2. Dyslipidemia.  3. History of hypertension, which the daughter states is much improved      and does not require medications.  4. History of thyroidectomy, for which she is on Synthroid.   CURRENT MEDICATIONS:  Include:  1. Lipitor 10 mg daily.  2. Avandia 4 mg daily.  3. Slow Iron 50 mg b.i.d.  4. Metformin 500 mg b.i.d.  5. Glimepiride 4 mg q.a.m.  6. Synthroid 120 mcg 1 daily.  7. TriCor 145 mg daily.   ALLERGIES:  PATIENT IS INTOLERANT/ALLERGIC TO CODEINE.   SOCIAL HISTORY:  She lives with her son who  is fairly independent, just  a history of smoking, but has not done so in many years.   FAMILY HISTORY:  Noncontributory.   REVIEW OF SYSTEMS:  As per history of present illness.   PHYSICAL EXAMINATION:  GENERAL:  She is an obese female in no acute  distress.  VITAL SIGNS:  Blood pressure is 147/68.  Pulse 81.  Temperature is 97.8.  Her respiratory rate is 16.  Her O2 saturation is 100%.  HEENT:  Pupils equal, round and reactive to light and accommodation.  Extraocular muscles were intact.  Nares are patent without discharge.  Oropharynx is clear.  NECK:  Supple without JVD or  lymphadenopathy.  There is a well healed  thyroidectomy scar.  CHEST:  Totally clear to auscultation.  CARDIAC EXAM:  Reveals regular rate and rhythm.  No murmurs, rubs or  gallops.  ABDOMEN:  Soft, nontender with good bowel sounds.  GENITAL EXAM/BREAST  EXAM:  Breast exam was not done and not indicated.  LOWER EXTREMITIES:  Without edema.  Pulses were intact distally, but  mildly decreased.  NEUROLOGICALLY:  She is alert and oriented and moves all 4 extremities.   LABORATORY DATA:  UA shows 0-2 white blood cells, otherwise, is  unremarkable.  There are no other labs that have returned at this point  in time.   IMPRESSION:  Hypoglycemia probably secondary to inadequate caloric  intake and being on oral hypoglycemics.  Because the patient has had  multiple readings in the low range and is on long acting medication, she  will need to come in for closer watches over her blood sugars.   PLAN:  1. Admit for close glucose monitoring.  2. Start IV fluids of D5 half normal saline.  3. Hold oral hypoglycemics for now and follow CBGs.  4. Followup labs to complete her database.      Kathee Delton, MD,FCCP  Electronically Signed     [facility]/MEDQ  D:  02/16/2006  T:  02/17/2006  Job:  [id number]   cc:   Deborra Medina. Lenna Gilford, MD

## 2010-06-28 NOTE — Discharge Summary (Signed)
Jocelyn Mendez, Jocelyn Mendez               ACCOUNT NO.:  [id number]   MEDICAL RECORD NO.:  [id number]          PATIENT TYPE:  INP   LOCATION:  5020                         FACILITY:  [facility]   PHYSICIAN:  Deborra Medina. Lenna Gilford, MD     DATE OF BIRTH:  1930/05/13   DATE OF ADMISSION:  02/16/2006  DATE OF DISCHARGE:  02/19/2006                               DISCHARGE SUMMARY   FINAL DIAGNOSES:  1. Admitted February 16, 2006 with hypoglycemia secondary to diabetic      medications and renal insufficiency.  2. Diabetes mellitus with difficult control - medications adjusted      this admission.  3. Mild renal insufficiency with creatinine in the 1.7 to 2.0 range.  4. Anemia with hemoglobin in the 9 to 10 range; multifactorial      etiology with one stool positive for occult blood, anemia of      chronic disease, anemia of chronic renal insufficiency, and B12      deficiency.  5. Pernicious anemia with B12 level 77 in 2006 and positive parietal      cell antibodies - the patient takes B12 shots monthly.  6. History of asthmatic bronchitis - no recent exacerbations.  7. History of hypertension which is mostly diet-controlled.  8. Hypercholesterolemia - patient on Lipitor and Tricor.  9. History of hypothyroidism - the patient on Synthroid.  10.History of gastroesophageal reflux disease in the past; the patient      started on omeprazole b.i.d. with one heme-positive stool this      admission and anemia as noted above.  11.History of diverticulosis with last colonoscopy in March of 2006 by      Dr. Delfin Edis showing only diverticulosis, no polyps,  et Ronney Asters.  12.History of degenerative arthritis with low back pain, with      evaluation by Dr. Louanne Skye and previously treated with Lyrica.  13.Osteopenia with bone density between -1.1 and -1.4 when last      checked in 2006, treated with calcium and vitamins.  14.History of mixed headaches previously treated with Midrin.  15.History of anxiety.   BRIEF  HISTORY AND PHYSICAL:  The patient is a 77 year old white female  with history of diabetes who presented to emergency room on February 16, 2006 with sustained hypoglycemia.  She states she felt fine until the  day of admission when she began to feel lightheaded, foggy , and had  low blood sugar around 40 by her glucometer.  She tried to eat, but the  sugar remained low and she was brought to the emergency room where  sugars were sustained in the 40-50 range.  She states she was eating  well at home but had missed breakfast that day.  She denied any fever,  chills, sweats, or source of infection.  She denied any cough or  shortness of breath or chest pain.  Due to multiple low readings, it was  felt that she needed to be hospitalized for IV fluids and adjustment in  her diabetic regimen.   PAST MEDICAL HISTORY:  As noted, she has diabetes for many years  and was  previously on Glucovance 5/500 twice a day and Avandia 4 mg a day.  In  2007, her sugars were somewhat volatile, and her hemoglobin A1c was 7.3  in October.  She was switched to metformin 500 b.i.d., glimepiride 4 mg  p.o. daily, and Avandia 4 mg a day.  She had not been back to the office  for followup since that time.  She has history of hypercholesterolemia  and takes Lipitor and Tricor.  She has hypothyroidism and takes  Synthroid.  She has a history of chronic renal insufficiency with  creatinines in the 1.7 to 2.0 range last year.  She has diet-controlled  hypertension and had a cardiac evaluation for atypical chest pain by Dr.  Domenic Polite in March of 2007.  Her remote history showed normal coronaries  on catheterization in 1998 and a negative Cardiolite in 2002 and again  to 2007.  She has history of asthmatic bronchitis but no recent  exacerbations.  She has a history of gastroesophageal reflux disease in  the past but no recent problems and does not take any medication.  She  has known diverticulosis.  Last colonoscopy in  March of 2006 by Dr. Delfin Edis showed diverticulosis only.  She has a history of some low back  pain and neuropathy and was treated with Lyrica by Dr. Louanne Skye in the  past.  She has mild osteopenia with bone density between -1.1 and -1.4  when last checked in 2006.  She takes calcium and vitamins for this.  She has a history of chronic anemia with a hemoglobin in the 10 to 11  range.  Her MCVs have been around 90, and she has been on iron therapy.  She was found to have pernicious anemia with a B12 level of 77 in 2006  and positive parietal cell antibodies and gets B12 shots monthly at  home.  She has a history of a mixed type headaches previous treated with  Midrin and a history of anxiety.   PHYSICAL EXAMINATION:  GENERAL:  Elderly white female in no acute  distress.  VITAL SIGNS:  Blood pressure 146/68, pulse 80 and regular, respirations  20 per minute and not labored, temperature 98 degrees.  HEENT:  Unremarkable.  NECK:  Supple.  No jugular distension or adenopathy.  There is a well-  healed thyroidectomy scar.  LUNGS:  Clear to percussion and auscultation.  CARDIAC:  Regular rhythm.  No murmurs, rubs or gallops heard.  ABDOMEN:  Soft and nontender without evidence of organomegaly or masses.  EXTREMITIES:  No cyanosis, clubbing or edema.  NEUROLOGIC:  Intact without focal abnormalities detected.   LABORATORY DATA:  EKG showed normal sinus rhythm and nonspecific ST-T  wave changes.  No acute abnormality.  Chest x-ray showed normal heart  size, clear lungs, negative film.   Hemoglobin 8.8, hematocrit 26.0, white count 4300 with a normal  differential.  Stool for occult blood was positive on one occasion.  Reticulocyte count 1.8.  Protime 14.9, INR 1.1, PTT 27 seconds.  Sodium  136, potassium 4.9, chloride 110, CO2 22, BUN 22, creatinine 1.7, blood  sugar 115.  Serial blood sugars followed throughout her hospital course. Calcium 9.5, total protein 6.1, albumin 3.1, AST 21, ALT  14, alk phos  32, total bilirubin 0.9.  Hemoglobin A1C 5.9.  Beta nitrate peptide 30.  TSH 2.29.  B12 level 427.  Folate level 10.1, ferritin 136.   HOSPITAL COURSE:  The patient was admitted with sustained  hypoglycemia,  the likely result of her diabetic medications, especially the  sulfonylurea, and mild renal insufficiency and missing breakfast that  morning.  She was given IV glucose and monitored carefully.  His sugars  came up and remained that way.  She was placed on sliding scale, and  blood sugars stabilized in the 200 range.  She was started back on her  metformin and Avandia, but we have left off the glimepiride at the  present time.  She was continued on her home medications, ambulated on  the ward, and felt to be MHB and ready for discharge on February 19, 2006.  She did receive sliding scale coverage while on the IVs.   From the anemia standpoint, she had a full anemia workup and was found  to have heme-positive stool, and B12 and folate levels were okay.  She  was started on omeprazole and iron and will be followed up as an  outpatient.   DISCHARGE MEDICATIONS:  1. Metformin 500 mg p.o. b.i.d. before breakfast and dinner.  2. Avandia 4 mg p.o. daily before dinner.  3. Omeprazole 20 mg p.o. b.i.d. taken 30 minutes for breakfast and      dinner.  4. Synthroid 0.125 mg p.o. daily.  5. Lipitor 20 mg p.o. daily.  6. Tricor 145 mg p.o. daily.  7. Iron supplement daily.  8. B12 shots monthly.  9. Baby aspirin one daily.   DISPOSITION:  The patient will follow up in the office on March 04, 2006 at 1:45 p.m. and bring her list of Accu-Cheks from home.   CONDITION ON DISCHARGE:  Improved.      Deborra Medina. Lenna Gilford, MD  Electronically Signed     SMN/MEDQ  D:  02/19/2006  T:  02/19/2006  Job:  [id number]

## 2010-07-04 ENCOUNTER — Ambulatory Visit (INDEPENDENT_AMBULATORY_CARE_PROVIDER_SITE_OTHER): Payer: Medicare Other | Admitting: Pulmonary Disease

## 2010-07-04 ENCOUNTER — Other Ambulatory Visit (INDEPENDENT_AMBULATORY_CARE_PROVIDER_SITE_OTHER): Payer: Medicare Other

## 2010-07-04 DIAGNOSIS — E538 Deficiency of other specified B group vitamins: Secondary | ICD-10-CM

## 2010-07-04 DIAGNOSIS — N259 Disorder resulting from impaired renal tubular function, unspecified: Secondary | ICD-10-CM

## 2010-07-04 DIAGNOSIS — E119 Type 2 diabetes mellitus without complications: Secondary | ICD-10-CM

## 2010-07-04 DIAGNOSIS — D638 Anemia in other chronic diseases classified elsewhere: Secondary | ICD-10-CM

## 2010-07-04 DIAGNOSIS — E039 Hypothyroidism, unspecified: Secondary | ICD-10-CM

## 2010-07-04 DIAGNOSIS — K5730 Diverticulosis of large intestine without perforation or abscess without bleeding: Secondary | ICD-10-CM

## 2010-07-04 DIAGNOSIS — I10 Essential (primary) hypertension: Secondary | ICD-10-CM

## 2010-07-04 DIAGNOSIS — F411 Generalized anxiety disorder: Secondary | ICD-10-CM

## 2010-07-04 DIAGNOSIS — K219 Gastro-esophageal reflux disease without esophagitis: Secondary | ICD-10-CM

## 2010-07-04 LAB — BASIC METABOLIC PANEL
BUN: 44 mg/dL — ABNORMAL HIGH (ref 6–23)
Calcium: 10 mg/dL (ref 8.4–10.5)
GFR: 31.14 mL/min — ABNORMAL LOW (ref >60.00)
Glucose, Bld: 255 mg/dL — ABNORMAL HIGH (ref 70–99)

## 2010-07-04 LAB — CBC WITH DIFFERENTIAL/PLATELET
Basophils Absolute: 0.1 10*3/uL (ref 0.0–0.1)
HCT: 33.1 % — ABNORMAL LOW (ref 36.0–46.0)
Lymphocytes Relative: 18.7 % (ref 12.0–46.0)
Lymphs Abs: 1 10*3/uL (ref 0.7–4.0)
Monocytes Relative: 12 % (ref 3.0–12.0)
Platelets: 253 10*3/uL (ref 150.0–400.0)
RDW: 17 % — ABNORMAL HIGH (ref 11.5–14.6)

## 2010-07-04 LAB — HEMOGLOBIN A1C: Hgb A1c MFr Bld: 8.6 % — ABNORMAL HIGH (ref 4.6–6.5)

## 2010-07-04 MED ORDER — SAXAGLIPTIN HCL 5 MG PO TABS: 5.0000 mg | ORAL_TABLET | Freq: Every day | ORAL | Status: DC

## 2010-07-04 NOTE — Patient Instructions (Signed)
Today we updated your med list in EPIC...    We decided to increase your ONGLYZA to 5mg  daily, and we will set up a referral to a diabetic specialist...  Today we did your follow up blood work...    Please call the [facility] in a few days for your results...    Dial [id number] & when prompted enter your patient number followed by the # symbol...    Your patient number is:  [id number]  We will send copies of your labs the the Kidney doctor & the Diabetic doctor...  Call for any questions.Marland KitchenMarland Kitchen

## 2010-07-04 NOTE — Progress Notes (Signed)
Subjective:    Patient ID: Jocelyn Mendez, female    DOB: 1930/11/11, 77 y.o.   MRN: [id number]  HPI  77 y/o WF here for a follow up visit... she has mult med problems including:  HBP, Hypercholesterolemia, DM, mild renal insuffic, & multifactorial anemia w/ B12 defic on shots monthly... she also has DJD, LBP, Osteopenia, etc...  ~  November 12, 2009:  states BS improving 115-130 range at home but FBS today 175 & A1c still 7.9 on MetformBid & Glimep2mg  (states she's taking meds regularly)... FLP fair on Lip20 + Feno160, needs better low chol/ low fat diet... BP controlled & otherw feeling OK w/o CP, palpit, SOB, edema, etc... we reviewed the importance of diet + exercise, & will incr the Glimep to 4mg /d...  ~  May 13, 2010:  6 month ROV & she notes incr anxiety & she wants to incr her Alprazolam to 0.25mg  Tid;  Also notes "staggering" & "chest pains"> she wants to see Neurology for eval of the former, and Cardiology to check the latter (see below)...    Cardiac>  BP controlled on Diovan/HCT & BP= 126/78 today w/o postural changes;  She is c/o dull squeezing CP that goes to her left arm on occas but is not really activity related, & quite variable;  She denies HA, visual symptoms, ch in SOB, edema, etc;  EKG shows NSR, borderline 1st degree AVB, poor R prog V1-4, NAD (unchanged from old tracings);  She has hx atypical CP in past (mostly sharp CWP treated w/ Tylenol) & had cath 1988 w/ norm coronaries & mild MVP seen;  Pt daughter wants a Cards consult...    Hyperlipid>  On Lip20 + Fenofib160, plus her low fat diet;  FLP is no better today w/ TChol 160, TG 258, HDL 19, LDL 100;  Needs better low fat diet, offered ch to Cres20 but cost a factor, didn't do any better on Lopid in past & reluctant to take Niacin due to flushing...    DM>  On Metformin500Bid & Glimep4mg /d and her home BS checks are elev in the 150-200 range she says;  Prev A1c's = 7-9 over the last yr;  BS today 290 & A1c=10.3;  We discussed  need for much better diet, & we will need to incr her Metform to 1000mg  Bid & add Onglyza 5mg /d; she wants to avoid "the needle"...    Hypothy>  On Synthroid 156mcg/d & TSH is stable = 1.02 today, continue same dose...    Renal Insuffic>  Renal function is stable w/ BUN= 28, Creat= 1.4 today...    DJD & Gait Abn>  She is also c/o "staggering" all the time she says, by this she is referring to a multifactorial gait abn (not dizzy, pre-syncopal, etc);  She has DJD, cerv & lumbar spondylosis, osteopenia, vit d defic, etc;  Daughter wants Neuro eval for her staggering prob> she had CHI 9/10, seen [facility] w/ CT showing atrophy & tiny SAH that resolved quickly...    Anemia>  She has pernicious anemia on B12 shots monthly & a multifactorial anemia for which she takes Fe, Vit C, MVI;  Hg=10.3, MCV=85, continue same Rx...    Anxiety>  On Alprazolam 0,25mg  but only taking 1/2 tab prn;  rec to incr dose to 1 tab tid...  ~  May 22, 2010:  Family call for an urgent add-on appt due to apparent gastroenteritis w/ N V D> nausea x 3-4d w/ fair intake, vomit x1 but  keeping fluids down, & diarrhea x 4-5 times daily for several days but stopped last PM & no BM today;  She denies F C S, states appetite is good, just feeling weak & BS are up & down;  otherw situation is as above, they needed a lot of reassurance & support, it does not appear as though the Metformin was causing the diarrhea as it has now stopped w/o ch in meds.  ~  Jul 04, 2010:  S/p Hosp by [facility] in April & May for dehydration, renal insuffic, etc; then UTI (sens EColi), weakness, DM, ?type 4 RTA> see DCSummary...  Told she has kidney disease & they have appt w/ Nephrology pending- set up by the hospitalists; they also want appt w/ diabetic specialist- we will arrange this per their request...         Problem List:  Hx of ASTHMATIC BRONCHITIS, ACUTE (ICD-466.0) - no recent bronchitic episodes... the increased activity has really helped her... ~  CXR 5/12  showed clear lungs x mild scarring left base...  HYPERTENSION (ICD-401.9) - prev on Diovan/HCT but this was stopped 4/12 Hosp & now on NORVASC 5mg /d... ~  5/12:  BP= 124/62 & she denies CP, palpit, SOB, edema...  Hx of CHEST PAIN (ICD-786.50) - on ASA 81mg /d... 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 ~  cath 1988 showed normal coronaries, min MVP...  DM (ICD-250.00) - on GLIMEPIRIDE 4mg /d + ONGLYZA 2.5mg /d (off her prev Metformin rx since 4/12 hosp)... ~  labs 8/08 showed BS= 113, HgA1c=5.4.Marland KitchenMarland Kitchen On Metform + Glimep. ~  labs 4/09 showed BS= 99, HgA1c= 5.5.Marland KitchenMarland Kitchen ~  labs 4/10 showed BS= 125, A1c= 7.1.Marland Kitchen. rec> better diet, same meds for now. ~  labs 4/11 showed BS= 266, A1c= 9.1.Marland KitchenMarland Kitchen what happened? Add Glimep1mg /d back. ~  labs 6/11 showed BS= 195, A1c= 7.9.Marland KitchenMarland Kitchen incr Glimep to 2mg /d. ~  labs 10/11 showed BS= 175, A1c= 7.9.Marland KitchenMarland Kitchen rec incr Gimep to 4mg /d. ~  Labs in [facility] 5/12 showed BS= 232==>139; A1c=9.7; Metform stopped in hosp, on [place]. ~  Labs 5/12 post hosp showed BS= 255, A1c= 8.6.Marland KitchenMarland Kitchen Referred to Endocrinology.  HYPERLIPIDEMIA (ICD-272.4) - now on LIPITOR 20mg /d & FENOFIBRATE 160mg /d (ch from Lopid 10/10). ~  FLP 8/08 showed TChol 129, TG 197, HDL 17, LDL 73... despite taking statin & fibrate drugs. ~  FLP 4/09 on Lip20+Tric145 showed TChol 127, TG 269, HDL 13, LDL 69... rec> ch Tricor to [phone]. ~  [place] 10/09 on Lip20+Trilip135 showed TChol 124, TG 247, HDL 10!, LDL 64... rec> ch Trilip to LopidBid. ~  FLP 4/10 on Lip20+LopidBid showed TChol 174, TG 239, HDL 38, LDL 103... rec> ch Lopid to Feno160 (she never did). ~  FLP 10/10 showed TChol 134, TG 256, HDL 10, LDL 76... change the Lopid to Fenofib160/d. ~  FLP 4/11 on Lip20+Feno160 showed TChol 163, TG 234, HDL 28, LDL 108... keep same, better diet! ~  FLP 10/11 on Lip20+Feno160 showed TChol 172, Tg 304, HDL 26, LDL 112... take meds regularly & better diet. ~  FLP 4/12 on Lip20+Feno160 showed TChol 160,  TG 258, HDL 19, LDL 100... Needs better low fat diet.  HYPOTHYROIDISM (ICD-244.9) - on SYNTHROID 134mcg/d... ~  last TSH 1/08 = 0.41 ~  labs 4/09 showed TSH= 2.35 ~  labs 10/09 on Levothy125 showed TSH= 0.97 ~  labs 4/10 on Levothy125 showed TSH= 0.08... rec> decr [phone]. ~  labs 10/10 on Levothy112 showed TSH= 2.18... keep same. ~  labs 4/11 on Levo112 showed TSH= 0.51 ~  Labs 4/12 on Levo112 showed TSH= 1.02  GERD (ICD-530.81) - takes NEXIUM 40mg  Prn (states Omep20 "makes me sick")...  ~  EGD was planned in 2006 eval but never done...  DIVERTICULOSIS OF COLON (ICD-562.10) - c/o constip & rec> Miralax + Senakot-S... ~  last colonoscopy 3/06 by DrDBrodie showed divertics only...  RENAL INSUFFICIENCY (ICD-588.9) - prev Creat in the 1.7 - 2.0 range... ~  labs 8/08 showed BUN=26, Creat=1.5 ~  labs 4/09 showed BUN= 34, Creat= 1.5 ~  labs 10/09 showed BUN= 31, Creat= 1.5 ~  labs 4/10 showed BUN= 28, Creat= 1.0, Microalb= pos... continue Rx (on Diovan). ~  labs 10/10 showed BUN= 22, Creat= 1.2 ~  labs 4/11 showed BUN= 20, Creat= 1.5 ~  labs 6/11 showed BUN= 20, Creat= 1.3 ~  labs 10/11 showed BUN= 27, Creat= 1.5 ~  Renal Ultrasound 5/12 was neg- NAD, no hydronephrosis... ~  Labs in [place] 5/12 showed Creat= 2.11==>1.4 ~  Labs 5/12 post hosp showed BUN=44, Creat=1.7, K=5.7, HCO3=23  DEGENERATIVE JOINT DISEASE, GENERALIZED (ICD-715.00) - she uses Tylenol Prn. LOW BACK PAIN (ICD-724.2) OSTEOPOROSIS (ICD-733.00) - she takes calcium + vits daily... ~  BMD here 2/06 showed TScores -1.4 Spine,  -1.1 left FemNeck... ~  labs 4/09 showed Vit D level= 24... rec OTC Vit D 1000 u daily, but she stopped this. ~  labs 4/10 showed Vit D level = 20... rec> incr OTC Vit D 2000 u daily (she never did). ~  labs 4/11 showed Vit D level = 22...Marland KitchenMarland KitchenMarland Kitchen rec> incr to 2000 u daily. ~  Labs 4/12 showed Vit D level = 32  HEADACHE (ICD-784.0)  ANXIETY (ICD-300.00) - she notes her Bro has emphysema, lung cancer  and CHF... she takes ALPRAZOLAM 0.5mg  Prn.  ANEMIA>  Hx ACD & IronDefic>  Rx FeSO4 + VitC ~  Hx of multifactorial anemia w/ Hg=11 in 2005, Fe was OK, Stools neg for blood, Folate norm, B12 found low, & Creat 1.5+ ~  labs 1/08 showed Hg=11.1 ~  labs 8/08 showed Hg=9.9 ~  labs 4/09 showed Hg= 9.6, Fe=102, TIBC=344, Sat=21% ~  labs 10/09 showed Hg= 9.1, MCV= 92 ~  labs 4/10 showed = 11.1, Fe= 71 ~  labs 10/10 showed Hg= 9.3, MCV= 93... rec Fe Bid w/ Vit C... ~  labs 4/11 showed Hg= 11.8, MCV=87, Fe=86 ~  labs 10/11 showed Hg= 10.9, MCV= 89 ~  Labs in [facility] 5/12 showed Hg= 10.3==>8.6, Fe=19 (5%sat), B12=505, Folate=16, SPE= neg.  VITAMIN B12 DEFICIENCY (ICD-266.2) - Vit B12 level was 77 in 2006 w/ pos parietal cell antibodies; she's been on B12 shots ever since then...  ~  f/u B12 level 4/10 = >1500... ~  f/u B12 level 5/12= 505   Past Surgical History  Procedure Date  . Appendectomy     Outpatient Encounter Prescriptions as of 07/04/2010  Medication Sig Dispense Refill  . ALPRAZolam (XANAX) 0.25 MG tablet Take 1 tablet (0.25 mg total) by mouth 3 (three) times daily as needed for anxiety.  90 tablet  5  . amLODipine (NORVASC) 5 MG tablet Take 1 tablet (5 mg total) by mouth daily.  30 tablet  11  . aspirin 81 MG tablet Take 81 mg by mouth daily.        Marland Kitchen atorvastatin (LIPITOR) 20 MG tablet Take 20 mg by mouth daily.        . Cholecalciferol (VITAMIN D3) 3000 UNITS TABS Take 2 capsules  by mouth daily.       . cyanocobalamin (,VITAMIN B-12,) 1000 MCG/ML injection Inject 1,000 mcg into the muscle every 30 (thirty) days.        Marland Kitchen esomeprazole (NEXIUM) 40 MG capsule Take 40 mg by mouth daily before breakfast.        . fenofibrate 160 MG tablet Take 160 mg by mouth daily.        . ferrous sulfate 325 (65 FE) MG EC tablet Take 325 mg by mouth daily with breakfast.        . glimepiride (AMARYL) 4 MG tablet Take 1 tablet (4 mg total) by mouth every morning.  30 tablet  5  . saxagliptin HCl  (ONGLYZA) 2.5 MG TABS tablet Take 1 tablet (2.5 mg total) by mouth daily with supper. ==> INCR to 5mg /d    . levothyroxine (SYNTHROID, LEVOTHROID) 112 MCG tablet Take 112 mcg by mouth daily.          Allergies  Allergen Reactions  . Codeine     REACTION: change in mental status  . Diphenhydramine Hcl     REACTION: itching  . Quinine     REACTION: abnormal sensations in face  . Sulfonamide Derivatives     REACTION: rash    Review of Systems        See HPI - all other systems neg except as noted... The patient complains of nausea & diarrhea- now resolved.  The patient denies anorexia, fever, weight loss, weight gain, vision loss, decreased hearing, hoarseness, syncope, peripheral edema, prolonged cough, headaches, hemoptysis, abdominal pain, melena, hematochezia, severe indigestion/heartburn, hematuria, incontinence, suspicious skin lesions, transient blindness, depression, unusual weight change, abnormal bleeding, enlarged lymph nodes, and angioedema.     Objective:   Physical Exam      WD, WN, 77 y/o WF in NAD...  VS reviewed & BP= 120/64 w/o postural changes... GENERAL:  Alert & oriented; pleasant & cooperative... HEENT:  [place]/AT, EOM-wnl, PERRLA, EACs-clear, TMs- sl red on left, NOSE-clear, THROAT-clear & wnl. NECK:  Decr ROM; no JVD; normal carotid impulses w/o bruits; no thyromegaly or nodules palpated; no lymphadenopathy. CHEST:  Clear to P & A; without wheezes/ rales/ or rhonchi heard... HEART:  Regular Rhythm; without murmurs/ rubs/ or gallops detected... ABDOMEN:  Soft & nontender; normal bowel sounds; no organomegaly or masses palpated... EXT: without deformities, mild arthritic changes; no varicose veins/ +venous insuffic/ no edema. NEURO:  CN's intact; no focal neuro deficits or weakness found... DERM:  no lesions noted...   Assessment & Plan:   Hypertension>  Her BP remains controlled, now on Amlodipine, & specifically her BP is not too low & there are no postural  changes;  Continue same med...  CP>  She was seen 05/13/10 for a routine 13mo check up & daugh mentioned her atypCP & wanted cardiac eval which is pending;  Pt did not mention chest discomfort during this visit but she is 77y/o w/ mult cardiac risk factors & prev EKG w/ poor R progression & NSSTTWA...  Diabetes>  Meds were changed several times during recent Hosp> med reconciliation done & currently taking Glimep 4mg /d & Onglyza 2.5mg /d, off prev Metformin;  Control is poor & she doesn't want "the needle";  REC> incr ONGLYZA to 5mg /d & refer to Endocrinology for DM consult...  Hyperlipidemia>  On Lipitor & Fenofibrate, needs better low fat diet, continue same meds...  Renal Insuffic>  The hospitaliists set up an appt for the pt to be seen by Nephrology- pending.Marland KitchenMarland Kitchen  Hypothyroid>  Stable on Levothyroid 171mcg/d...   Anemia>  Multifactorial, on B12 shots monthly & Fe/ VitC supplementation...  Anxiety>  On Alprazolam prn.Marland KitchenMarland Kitchen

## 2010-07-07 ENCOUNTER — Encounter: Payer: Self-pay | Admitting: Pulmonary Disease

## 2010-07-09 ENCOUNTER — Other Ambulatory Visit: Payer: Self-pay | Admitting: *Deleted

## 2010-07-09 MED ORDER — ATORVASTATIN CALCIUM 20 MG PO TABS: 20.0000 mg | ORAL_TABLET | Freq: Every day | ORAL | Status: DC

## 2010-07-09 NOTE — Telephone Encounter (Signed)
Refill of lipitor sent to [facility]

## 2010-07-09 NOTE — H&P (Signed)
Jocelyn Mendez, Jocelyn Mendez               ACCOUNT NO.:  [id number]  MEDICAL RECORD NO.:  [id number]           PATIENT TYPE:  I  LOCATION:  [id number]                         [facility]AC[facility]  PHYSICIAN:  Farris Has, MDDATE OF BIRTH:  [birth year]  DATE OF ADMISSION:  06/11/2010 DATE OF DISCHARGE:                             HISTORY & PHYSICAL   PRIMARY CARE PROVIDER:  Deborra Medina. Lenna Gilford, MD  CHIEF COMPLAINT:  Feeling unwell.  HISTORY OF PRESENT ILLNESS:  The patient is a 77 year old female with past history of diabetes, recent admission for urinary tract infection and dehydration.  She also have been having recent headache, neck pain, weight loss, and generalized weakness.  Yesterday, her blood sugar was up to 245.  The patient was found to have a recurrent urinary tract infection.  Her creatinine is at baseline around 2, but she does appear to be somewhat fluid down, although she states she has been trying to drink plenty of water at home.  Otherwise, no chest pains, no shortness of breath, no fevers.  She just overall feels unwell.  PAST MEDICAL HISTORY:  Significant for, 1. Anemia. 2. Anxiety. 3. Arthritis. 4. Diabetes. 5. GERD. 6. Hyperlipidemia. 7. Hypothyroidism. 8. Renal insufficiency, baseline creatinine around 2.  REVIEW OF SYSTEMS:  Unremarkable.  SOCIAL HISTORY:  Does not currently smoke, quit smoking in 1975, quit chewing tobacco 3 months ago.  Does not abuse drugs.  Does not drink. Lives at home, has supportive family.  FAMILY HISTORY:  Noncontributory.  ALLERGIES:  CODEINE.  MEDICATIONS: 1. Vitamin B12 injections monthly. 2. Vitamin D3, 1000 units 2 tablets daily. 3. Synthroid 112 mcg daily. 4. Saxagliptin 2.5 daily. 5. Calcium carbonate 1 tablet twice daily. 6. Lipitor 20 mg daily. 7. Ferrous sulfate 325 mg daily. 8. Fenofibrate 160 mg daily. 9. Aspirin 81 mg daily. 10.Amaryl 4 mg daily.  She also was recently started on this. 11.Amlodipine 5 mg  daily.  PHYSICAL EXAMINATION:  VITAL SIGNS:  Temperature 98.3, blood pressure 109/69, pulse 88, respirations 18, saturating 96% on room air. GENERAL:  The patient appears to be in no acute distress. HEENT:  Head nontraumatic.  Dry mucous membranes.  Decreased skin turgor. NECK:  Supple. LUNGS:  Clear to auscultation bilaterally.  No crackles or wheezes appreciated. HEART:  Regular rate and rhythm.  No murmurs appreciated. ABDOMEN:  Soft, nontender, nondistended. LOWER EXTREMITIES:  No clubbing, cyanosis, or edema. NEUROLOGIC:  Grossly intact.  LABORATORY DATA:  White blood cell count 8.3, hemoglobin 10.  Sodium 130; potassium 5.4; creatinine 2.0, which is a baseline; calcium 12.4, which is elevated for the past 2 weeks.  Troponin unremarkable.  UA showing 7-10 white blood cells, negative for nitrites.  Chest x-ray unremarkable.  EKG showing heart rate of 100, sinus rhythm, first-degree AV block.  No ischemia.  ASSESSMENT/PLAN:  This is a 77 year old female, which clinically appears to be somewhat dehydrated, elevated calcium, hyponatremia, elevated potassium. 1. Urinary tract infection.  This is possibly undertreated versus     recurrent.  In the past, she had a pansensitive E coli.  We will     put on Rocephin, await  urine culture to see if it is actually     continues to be an issue.  She did receive 2 Cipro, 1 dose in the     emergency department.  She was discharged home on Cipro and     completed her course.  Of note, saxagliptin can increase of     frequency of urinary tract infection, we will stop that. 2. Elevated calcium in the setting of clinically occurrence of     dehydration.  This has only been going on since last couple weeks,     not sure about its significance at this point.  We will check     ionized calcium, PTH, TSH, give IV fluids, and followup.  This part     needs to be further investigated.  She did not seem to have any     protein in her urine.  If  calcium remains elevated, SPEP and UPEP     should be sent. 3. Hyponatremia.  The patient appears to be dehydrated clinically.  We     will give IV fluids and followup sodium, also check cortisol level,     TSH, urine electrolytes. 4. Overall, generally feeling unwell likely secondary to urinary tract     infection and dehydration, but in this elderly female, we will also     for completion sake, cycle cardiac markers.  She is feeling weak     and has trouble ambulating with some degree of debility.  We will     get PT/OT evaluation. 5. Elevated potassium.  This has actually down from before.  We will     see how she does with IV fluids. 6. She does seem to have at this point more of a chronic kidney     disease, I think her baseline creatinine is around 2.  She has had     a renal ultrasound done already, which was inconclusive.  We will     continue to watch kidney function, give IV fluids to see if there     is any improvement. 7. Diabetes mellitus.  Hold all p.o. medications, put her on sliding     scale. 8. Prophylaxis.  Protonix and Lovenox. 9. Code status.  Full code.     Farris Has, MD     AVD/MEDQ  D:  06/11/2010  T:  06/11/2010  Job:  [id number]  cc:   Deborra Medina. Lenna Gilford, [place] [id number]  Electronically Signed by Toy Baker MD on 07/09/2010 05:58:55 AM

## 2010-07-15 ENCOUNTER — Ambulatory Visit: Payer: Medicare Other | Admitting: Endocrinology

## 2010-07-22 ENCOUNTER — Other Ambulatory Visit: Payer: Self-pay | Admitting: *Deleted

## 2010-07-22 MED ORDER — CYANOCOBALAMIN 1000 MCG/ML IJ SOLN: 1000.0000 ug | INTRAMUSCULAR | Status: AC

## 2010-08-12 ENCOUNTER — Ambulatory Visit (INDEPENDENT_AMBULATORY_CARE_PROVIDER_SITE_OTHER): Payer: Medicare Other | Admitting: Pulmonary Disease

## 2010-08-12 ENCOUNTER — Encounter: Payer: Self-pay | Admitting: Pulmonary Disease

## 2010-08-12 ENCOUNTER — Ambulatory Visit (INDEPENDENT_AMBULATORY_CARE_PROVIDER_SITE_OTHER): Payer: Medicare Other

## 2010-08-12 DIAGNOSIS — E039 Hypothyroidism, unspecified: Secondary | ICD-10-CM

## 2010-08-12 DIAGNOSIS — M159 Polyosteoarthritis, unspecified: Secondary | ICD-10-CM

## 2010-08-12 DIAGNOSIS — F411 Generalized anxiety disorder: Secondary | ICD-10-CM

## 2010-08-12 DIAGNOSIS — I10 Essential (primary) hypertension: Secondary | ICD-10-CM

## 2010-08-12 DIAGNOSIS — E119 Type 2 diabetes mellitus without complications: Secondary | ICD-10-CM

## 2010-08-12 DIAGNOSIS — E785 Hyperlipidemia, unspecified: Secondary | ICD-10-CM

## 2010-08-12 DIAGNOSIS — D649 Anemia, unspecified: Secondary | ICD-10-CM

## 2010-08-12 DIAGNOSIS — E538 Deficiency of other specified B group vitamins: Secondary | ICD-10-CM

## 2010-08-12 DIAGNOSIS — D638 Anemia in other chronic diseases classified elsewhere: Secondary | ICD-10-CM

## 2010-08-12 DIAGNOSIS — J209 Acute bronchitis, unspecified: Secondary | ICD-10-CM

## 2010-08-12 DIAGNOSIS — N259 Disorder resulting from impaired renal tubular function, unspecified: Secondary | ICD-10-CM

## 2010-08-12 NOTE — Patient Instructions (Signed)
Today we updated your med list in EPIC>    Remember to bring all of your med bottles to every visit w/ your doctors...  Today we gave you your monthly B12 shot...  Keep your appt w/ the diabetic specialist & your kidney doctors as planned...  Let's plan a routine follow up here in 3-4 months.Marland KitchenMarland Kitchen

## 2010-08-13 DIAGNOSIS — D649 Anemia, unspecified: Secondary | ICD-10-CM

## 2010-08-13 MED ORDER — CYANOCOBALAMIN 1000 MCG/ML IJ SOLN
1000.0000 ug | Freq: Once | INTRAMUSCULAR | Status: AC
  Administered 2010-08-13: 1000 ug via INTRAMUSCULAR

## 2010-08-19 ENCOUNTER — Encounter: Payer: Self-pay | Admitting: Pulmonary Disease

## 2010-08-19 NOTE — Progress Notes (Signed)
Subjective:    Patient ID: Jocelyn Mendez, female    DOB: 11/23/1930, 77 y.o.   MRN: [id number]  HPI 77 y/o WF here for a follow up visit... she has mult med problems including:  HBP, Hypercholesterolemia, DM, mild renal insuffic, & multifactorial anemia w/ B12 defic on shots monthly... she also has DJD, LBP, Osteopenia, etc...  ~  November 12, 2009:  states BS improving 115-130 range at home but FBS today 175 & A1c still 7.9 on MetformBid & Glimep2mg  (states she's taking meds regularly)... FLP fair on Lip20 + Feno160, needs better low chol/ low fat diet... BP controlled & otherw feeling OK w/o CP, palpit, SOB, edema, etc... we reviewed the importance of diet + exercise, & will incr the Glimep to 4mg /d...  ~  May 13, 2010:  6 month ROV & she notes incr anxiety & she wants to incr her Alprazolam to 0.25mg  Tid;  Also notes "staggering" & "chest pains"> she wants to see Neurology for eval of the former, and Cardiology to check the latter (see below)...    Cardiac>  BP controlled on Diovan/HCT & BP= 126/78 today w/o postural changes;  She is c/o dull squeezing CP that goes to her left arm on occas but is not really activity related, & quite variable;  She denies HA, visual symptoms, ch in SOB, edema, etc;  EKG shows NSR, borderline 1st degree AVB, poor R prog V1-4, NAD (unchanged from old tracings);  She has hx atypical CP in past (mostly sharp CWP treated w/ Tylenol) & had cath 1988 w/ norm coronaries & mild MVP seen;  Pt daughter wants a Cards consult...    Hyperlipid>  On Lip20 + Fenofib160, plus her low fat diet;  FLP is no better today w/ TChol 160, TG 258, HDL 19, LDL 100;  Needs better low fat diet, offered ch to Cres20 but cost a factor, didn't do any better on Lopid in past & reluctant to take Niacin due to flushing...    DM>  On Metformin500Bid & Glimep4mg /d and her home BS checks are elev in the 150-200 range she says;  Prev A1c's = 7-9 over the last yr;  BS today 290 & A1c=10.3;  We discussed  need for much better diet, & we will need to incr her Metform to 1000mg  Bid & add Onglyza 5mg /d; she wants to avoid "the needle"...    Hypothy>  On Synthroid 139mcg/d & TSH is stable = 1.02 today, continue same dose...    Renal Insuffic>  Renal function is stable w/ BUN= 28, Creat= 1.4 today...    DJD & Gait Abn>  She is also c/o "staggering" all the time she says, by this she is referring to a multifactorial gait abn (not dizzy, pre-syncopal, etc);  She has DJD, cerv & lumbar spondylosis, osteopenia, vit d defic, etc;  Daughter wants Neuro eval for her staggering prob> she had CHI 9/10, seen [facility] w/ CT showing atrophy & tiny SAH that resolved quickly...    Anemia>  She has pernicious anemia on B12 shots monthly & a multifactorial anemia for which she takes Fe, Vit C, MVI;  Hg=10.3, MCV=85, continue same Rx...    Anxiety>  On Alprazolam 0,25mg  but only taking 1/2 tab prn;  rec to incr dose to 1 tab tid...  ~  May 22, 2010:  Family call for an urgent add-on appt due to apparent gastroenteritis w/ N V D> nausea x 3-4d w/ fair intake, vomit x1 but keeping  fluids down, & diarrhea x 4-5 times daily for several days but stopped last PM & no BM today;  She denies F C S, states appetite is good, just feeling weak & BS are up & down;  otherw situation is as above, they needed a lot of reassurance & support, it does not appear as though the Metformin was causing the diarrhea as it has now stopped w/o ch in meds.  ~  Jul 04, 2010:  s/p Hosp by [facility] in April & May for dehydration, renal insuffic, etc; then UTI (sens EColi), weakness, DM, ?type 4 RTA> see DCSummary...  Told she has kidney disease & they have appt w/ Nephrology pending- set up by the hospitalists; they also want appt w/ diabetic specialist- we will arrange this per their request...  ~  August 12, 2010:  36mo ROV & review> She will now be receiving her care by committee:  Seen by Nephrology recently (note pending) & she tells me that "they stopped the  Metformin & raised the little blue pill to 1&1/2 daily";  She has appt w/ DrKerr for DM/Endocrine July 24 & her home BS checks have all been in the 200's (currently on [place]);  BP remains controlled on meds as below;  Lipids fair on Lip20 & Fenofib160 but needs better low fat diet & incr exerc;  Thyroid stable on 117mcg/d;  See prob list>>          Problem List:  Hx of ASTHMATIC BRONCHITIS, ACUTE (ICD-466.0) - no recent bronchitic episodes... the increased activity has really helped her... ~  CXR 5/12 showed clear lungs x mild scarring left base...  HYPERTENSION (ICD-401.9) - prev on Diovan/HCT but this was stopped 4/12 Hosp & now on NORVASC 5mg /d... ~  5/12:  BP= 124/62 & she denies CP, palpit, SOB, edema... ~  7/12:  BP= 118/60 & stable but she did not bring med list or bottles to review...  Hx of CHEST PAIN (ICD-786.50) - on ASA 81mg /d;  she continues to experience intermittent sharp left CWP- Rx w/ Tylenol Prn. ~  cath 1988 showed normal coronaries, min MVP...  DM (ICD-250.00) - on GLIMEPIRIDE 4mg /d + ONGLYZA 5mg /d (off her prev Metformin rx since 4/12 hosp)... ~  labs 8/08 showed BS= 113, HgA1c=5.4.Marland KitchenMarland Kitchen On Metform + Glimep. ~  labs 4/09 showed BS= 99, HgA1c= 5.5.Marland KitchenMarland Kitchen ~  labs 4/10 showed BS= 125, A1c= 7.1.Marland Kitchen. rec> better diet, same meds for now. ~  labs 4/11 showed BS= 266, A1c= 9.1.Marland KitchenMarland Kitchen what happened? Add Glimep1mg /d back. ~  labs 6/11 showed BS= 195, A1c= 7.9.Marland KitchenMarland Kitchen incr Glimep to 2mg /d. ~  labs 10/11 showed BS= 175, A1c= 7.9.Marland KitchenMarland Kitchen rec incr Gimep to 4mg /d. ~  Labs in [facility] 5/12 showed BS= 232==>139; A1c=9.7; Metform stopped in hosp, on [place]. ~  Labs 5/12 post hosp showed BS= 255, A1c= 8.6.Marland KitchenMarland Kitchen Referred to Endocrinology. ~  7/12:  BS at home all in the 200s, she doesn't want additional meds, wants to wait for appt w/ DrKerr 7/24.  HYPERLIPIDEMIA (ICD-272.4) - on LIPITOR 20mg /d & FENOFIBRATE 160mg /d (ch from Lopid 10/10). ~  FLP 8/08 showed TChol 129, TG 197, HDL 17, LDL 73...  despite taking statin & fibrate drugs. ~  FLP 4/09 on Lip20+Tric145 showed TChol 127, TG 269, HDL 13, LDL 69... rec> ch Tricor to [phone]. ~  [place] 10/09 on Lip20+Trilip135 showed TChol 124, TG 247, HDL 10!, LDL 64... rec> ch Trilip to LopidBid. ~  FLP 4/10 on Lip20+LopidBid showed TChol 174,  TG 239, HDL 38, LDL 103... rec> ch Lopid to Feno160 (she never did). ~  FLP 10/10 showed TChol 134, TG 256, HDL 10, LDL 76... change the Lopid to Fenofib160/d. ~  FLP 4/11 on Lip20+Feno160 showed TChol 163, TG 234, HDL 28, LDL 108... keep same, better diet! ~  FLP 10/11 on Lip20+Feno160 showed TChol 172, Tg 304, HDL 26, LDL 112... take meds regularly & better diet. ~  FLP 4/12 on Lip20+Feno160 showed TChol 160, TG 258, HDL 19, LDL 100... Needs better low fat diet.  HYPOTHYROIDISM (ICD-244.9) - on SYNTHROID 137mcg/d... ~  last TSH 1/08 = 0.41 ~  labs 4/09 showed TSH= 2.35 ~  labs 10/09 on Levothy125 showed TSH= 0.97 ~  labs 4/10 on Levothy125 showed TSH= 0.08... rec> decr [phone]. ~  labs 10/10 on Levothy112 showed TSH= 2.18... keep same. ~  labs 4/11 on Levo112 showed TSH= 0.51 ~  Labs 4/12 on Levo112 showed TSH= 1.02  GERD (ICD-530.81) - takes NEXIUM 40mg  Prn (states Omep20 "makes me sick")...  ~  EGD was planned in 2006 eval but never done...  DIVERTICULOSIS OF COLON (ICD-562.10) - c/o constip & rec> Miralax + Senakot-S... ~  last colonoscopy 3/06 by DrDBrodie showed divertics only...  RENAL INSUFFICIENCY (ICD-588.9) - prev Creat in the 1.7 - 2.0 range... ~  labs 8/08 showed BUN=26, Creat=1.5 ~  labs 4/09 showed BUN= 34, Creat= 1.5 ~  labs 10/09 showed BUN= 31, Creat= 1.5 ~  labs 4/10 showed BUN= 28, Creat= 1.0, Microalb= pos... continue Rx (on Diovan). ~  labs 10/10 showed BUN= 22, Creat= 1.2 ~  labs 4/11 showed BUN= 20, Creat= 1.5 ~  labs 6/11 showed BUN= 20, Creat= 1.3 ~  labs 10/11 showed BUN= 27, Creat= 1.5 ~  Renal Ultrasound 5/12 was neg- NAD, no hydronephrosis... ~  Labs in [facility]  5/12 showed Creat= 2.11==>1.4 ~  Labs 5/12 post hosp showed BUN=44, Creat=1.7, K=5.7, HCO3=23;  She has appt w/ Nephrology set up by the Hospitalists.  DEGENERATIVE JOINT DISEASE, GENERALIZED (ICD-715.00) - she uses Tylenol Prn. LOW BACK PAIN (ICD-724.2) OSTEOPOROSIS (ICD-733.00) - she takes calcium + vits daily... ~  BMD here 2/06 showed TScores -1.4 Spine,  -1.1 left FemNeck... ~  labs 4/09 showed Vit D level= 24... rec OTC Vit D 1000 u daily, but she stopped this. ~  labs 4/10 showed Vit D level = 20... rec> incr OTC Vit D 2000 u daily (she never did). ~  labs 4/11 showed Vit D level = 22...Marland KitchenMarland KitchenMarland Kitchen rec> incr to 2000 u daily. ~  Labs 4/12 showed Vit D level = 32  HEADACHE (ICD-784.0)  ANXIETY (ICD-300.00) - she notes her Bro has emphysema, lung cancer and CHF... she takes ALPRAZOLAM 0.5mg  Prn.  ANEMIA>  Hx ACD & IronDefic>  Rx FeSO4 + VitC ~  Hx of multifactorial anemia w/ Hg=11 in 2005, Fe was OK, Stools neg for blood, Folate norm, B12 found low, & Creat 1.5+ ~  labs 1/08 showed Hg=11.1 ~  labs 8/08 showed Hg=9.9 ~  labs 4/09 showed Hg= 9.6, Fe=102, TIBC=344, Sat=21% ~  labs 10/09 showed Hg= 9.1, MCV= 92 ~  labs 4/10 showed = 11.1, Fe= 71 ~  labs 10/10 showed Hg= 9.3, MCV= 93... rec Fe Bid w/ Vit C... ~  labs 4/11 showed Hg= 11.8, MCV=87, Fe=86 ~  labs 10/11 showed Hg= 10.9, MCV= 89 ~  Labs in [facility] 5/12 showed Hg= 10.3==>8.6, Fe=19 (5%sat), B12=505, Folate=16, SPE= neg.  VITAMIN B12 DEFICIENCY (ICD-266.2) - Vit  B12 level was 77 in 2006 w/ pos parietal cell antibodies; she's been on B12 shots ever since then...  ~  f/u B12 level 4/10 = >1500... ~  f/u B12 level 5/12= 505   Past Surgical History  Procedure Date  . Appendectomy     Outpatient Encounter Prescriptions as of 08/12/2010  Medication Sig Dispense Refill  . ALPRAZolam (XANAX) 0.25 MG tablet Take 1 tablet (0.25 mg total) by mouth 3 (three) times daily as needed for anxiety.  90 tablet  5  . amLODipine (NORVASC) 5 MG tablet  Take 1 tablet (5 mg total) by mouth daily.  30 tablet  11  . aspirin 81 MG tablet Take 81 mg by mouth daily.        Marland Kitchen atorvastatin (LIPITOR) 20 MG tablet Take 1 tablet (20 mg total) by mouth daily.  30 tablet  6  . Cholecalciferol (VITAMIN D3) 3000 UNITS TABS Take 2 capsules by mouth daily.       . cyanocobalamin (,VITAMIN B-12,) 1000 MCG/ML injection Inject 1 mL (1,000 mcg total) into the muscle every 30 (thirty) days.  1 mL  11  . esomeprazole (NEXIUM) 40 MG capsule Take 40 mg by mouth daily before breakfast.        . fenofibrate 160 MG tablet Take 160 mg by mouth daily.        . ferrous sulfate 325 (65 FE) MG EC tablet Take 325 mg by mouth daily with breakfast.        . glimepiride (AMARYL) 4 MG tablet Take 1 tablet (4 mg total) by mouth every morning.  30 tablet  5  . glucose blood test strip 1 each by Other route as needed. Use as instructed       . levothyroxine (SYNTHROID, LEVOTHROID) 112 MCG tablet Take 112 mcg by mouth daily.        . saxagliptin HCl (ONGLYZA) 5 MG TABS tablet Take 1 tablet (5 mg total) by mouth daily.  30 tablet  11    Allergies  Allergen Reactions  . Codeine     REACTION: change in mental status  . Diphenhydramine Hcl     REACTION: itching  . Quinine     REACTION: abnormal sensations in face  . Sulfonamide Derivatives     REACTION: rash    Review of Systems        See HPI - all other systems neg except as noted... The patient complains of nausea & diarrhea- now resolved.  The patient denies anorexia, fever, weight loss, weight gain, vision loss, decreased hearing, hoarseness, syncope, peripheral edema, prolonged cough, headaches, hemoptysis, abdominal pain, melena, hematochezia, severe indigestion/heartburn, hematuria, incontinence, suspicious skin lesions, transient blindness, depression, unusual weight change, abnormal bleeding, enlarged lymph nodes, and angioedema.     Objective:   Physical Exam      WD, WN, 77 y/o WF in NAD...  VS reviewed & BP=  120/64 w/o postural changes... GENERAL:  Alert & oriented; pleasant & cooperative... HEENT:  [place]/AT, EOM-wnl, PERRLA, EACs-clear, TMs- sl red on left, NOSE-clear, THROAT-clear & wnl. NECK:  Decr ROM; no JVD; normal carotid impulses w/o bruits; no thyromegaly or nodules palpated; no lymphadenopathy. CHEST:  Clear to P & A; without wheezes/ rales/ or rhonchi heard... HEART:  Regular Rhythm; without murmurs/ rubs/ or gallops detected... ABDOMEN:  Soft & nontender; normal bowel sounds; no organomegaly or masses palpated... EXT: without deformities, mild arthritic changes; no varicose veins/ +venous insuffic/ no edema. NEURO:  CN's intact; no focal  neuro deficits or weakness found... DERM:  no lesions noted...   Assessment & Plan:   Hypertension>  Her BP remains controlled, on Amlodipine monotherapy, & specifically her BP is not too low & there are no postural changes;  Continue same med...  CP>  She was seen 05/13/10 for a routine 70mo check up & daugh mentioned her atypCP & wanted cardiac eval which is pending;  Pt did not mention chest discomfort during this visit but she is 77y/o w/ mult cardiac risk factors & prev EKG w/ poor R progression & NSSTTWA...  Diabetes>  Meds were changed several times during recent Hospitalizations> med reconciliation done & currently taking Glimep 4mg /d & Onglyza 5mg /d, off prev Metformin;  Control is poor & she doesn't want "the needle";  REC> take meds regularly, get on diet, & refer to Endocrinology for DM consult (she has appt DrKerr 09/03/10)...  Hyperlipidemia>  On Lipitor & Fenofibrate, needs better low fat diet, continue same meds...  Renal Insuffic>  The hospitaliists set up an appt for the pt to be seen by Nephrology> we do not have note from that visit...  Hypothyroid>  Stable on Levothyroid 143mcg/d...   Anemia>  Multifactorial, on B12 shots monthly & Fe/ VitC supplementation...  Anxiety>  On Alprazolam prn.Marland KitchenMarland Kitchen

## 2010-08-28 ENCOUNTER — Telehealth: Payer: Self-pay | Admitting: *Deleted

## 2010-08-28 NOTE — Telephone Encounter (Signed)
Received faxed refill request from pharmacy for [place] on Glimepiride 4mg  once daily. Requesting "need new Rx w/new directions". I called and spoke with Butch Penny at pharmacy who advised one of the pt's daughters advised her pt is taking 2 tabs every morning. I called and spoke with pt who verified she does have Glimepiride 4mg  and is taking 1.5 tablets daily per Dr. Corliss Parish since 08-18-10 and has appointment with Dr. Buddy Duty 09-03-10. I called and advised Butch Penny of same and also advised since Dr. Moshe Cipro has changed Rx they will need to follow with her or Dr. Buddy Duty. Butch Penny states per pt's last fill should have enough to last until Dr. Cindra Eves appointment. Update made in pt's med list.

## 2010-09-04 ENCOUNTER — Emergency Department (HOSPITAL_COMMUNITY): Payer: No Typology Code available for payment source

## 2010-09-04 ENCOUNTER — Emergency Department (HOSPITAL_COMMUNITY)
Admission: EM | Admit: 2010-09-04 | Discharge: 2010-09-05 | Disposition: A | Discharge status: Home / Self Care | Payer: No Typology Code available for payment source | Attending: Emergency Medicine | Admitting: Emergency Medicine

## 2010-09-04 DIAGNOSIS — E119 Type 2 diabetes mellitus without complications: Secondary | ICD-10-CM | POA: Insufficient documentation

## 2010-09-04 DIAGNOSIS — M25569 Pain in unspecified knee: Secondary | ICD-10-CM | POA: Insufficient documentation

## 2010-09-04 DIAGNOSIS — IMO0002 Reserved for concepts with insufficient information to code with codable children: Secondary | ICD-10-CM | POA: Insufficient documentation

## 2010-09-04 DIAGNOSIS — I10 Essential (primary) hypertension: Secondary | ICD-10-CM | POA: Insufficient documentation

## 2010-09-04 DIAGNOSIS — E039 Hypothyroidism, unspecified: Secondary | ICD-10-CM | POA: Insufficient documentation

## 2010-09-04 DIAGNOSIS — R296 Repeated falls: Secondary | ICD-10-CM | POA: Insufficient documentation

## 2010-09-19 ENCOUNTER — Other Ambulatory Visit: Payer: Self-pay | Admitting: *Deleted

## 2010-09-19 MED ORDER — ESOMEPRAZOLE MAGNESIUM 40 MG PO CPDR: 40.0000 mg | DELAYED_RELEASE_CAPSULE | Freq: Every day | ORAL | Status: DC

## 2010-09-25 ENCOUNTER — Telehealth: Payer: Self-pay | Admitting: Pulmonary Disease

## 2010-09-25 NOTE — Telephone Encounter (Signed)
lmomtcb for pt 

## 2010-09-26 NOTE — Telephone Encounter (Signed)
Called and spoke with pt's daughter, Estill Bamberg. Estill Bamberg states the meds pt needs are : Amlodipine, Synthroid and Fenofibrate.  Estill Bamberg wanted to see if pt can get an early refill on these meds and also see if the pharmacy will change the refill dates on her meds to the 1st of every month.  Estill Bamberg states she ends up driving up and down the road multiple times per month picking up pt's meds because they all get filled on different days.  Informed Estill Bamberg I will call [facility] and discuss the above information and call her back.   Called and spoke with Tressie Ellis from [employer].  He stated regarding the early refill for vacation:  Pt can just call requesting an early refill on whichever meds she is needing.  They do not need an ok from our office.  Regarding having her meds filled the 1st of the month:  He stated this is ok to do but may have complications with ins.  Pt will either need to pay a double copay or pay out of pocket in order to get her refills to [facility] up to the 1st of every month.    ATC amanda back to inform her of the above information.  NA.  [id number]

## 2010-09-26 NOTE — Telephone Encounter (Signed)
LMTCB

## 2010-09-26 NOTE — Telephone Encounter (Signed)
PT'S DAUGHTER AMANDA RETURNED CALL FROM [facility]. CALL HER BACK AT [phone] OR [phone]. SHE SAYS SHE WOULD "PREFER" TO JUST COME IN TOMORROW AND SPEAK TO NURSE AND SHOW HER PT'S MED LIST AS THERE'S "SO MUCH ON THERE". Mariann Laster

## 2010-09-26 NOTE — Telephone Encounter (Signed)
Called and spoke with pt's daughter Jocelyn Mendez.  Jocelyn Mendez states pt will be going on a 2 week vacation in September and pt will run out of meds while on vacation.  Therefore, daughter is requesting an ok for early refill on pt's meds.  Asked daughter which meds pt is needing refilled and she didn't know.  Stated she was currently in the car on her way home.  Stated she will call us back with a list of the meds once she gets home.

## 2010-09-27 NOTE — Telephone Encounter (Signed)
Called, spoke with pt's daughter, Estill Bamberg.  She was informed of below response from pharmacy regarding and early refill and regarding having all meds filled the 1 st of the month.  She thinks doing a double copay will be ok.  She will call pharmacy when she is ready to have this set up.  If anything further is needed from our office she will call back.

## 2010-10-10 ENCOUNTER — Telehealth: Payer: Self-pay | Admitting: Pulmonary Disease

## 2010-10-10 MED ORDER — FENOFIBRATE 160 MG PO TABS: 160.0000 mg | ORAL_TABLET | Freq: Every day | ORAL | Status: DC

## 2010-10-10 NOTE — Telephone Encounter (Signed)
Pt's daughter, Estill Bamberg, stated that she had called about this medication last week.  Pt is getting ready to go on vacation & they would like this filled "right now."  Estill Bamberg can be reached at [phone].  Estill Bamberg asks to be called about this ASAP.  The pt's pharmacy is [facility], phone number [phone].  Satira Anis

## 2010-10-10 NOTE — Telephone Encounter (Signed)
Called and spoke with [facility] and she is aware that the fenofibrate has been sent to the pharmacy for refills for the pt.  Explained that she will need to give the pharmacy time to fill this and call to make sure that this has been done.  If she has any problems she is to call me back.

## 2010-10-11 ENCOUNTER — Other Ambulatory Visit: Payer: Self-pay | Admitting: *Deleted

## 2010-10-11 MED ORDER — LEVOTHYROXINE SODIUM 112 MCG PO TABS: 112.0000 ug | ORAL_TABLET | Freq: Every day | ORAL | Status: DC

## 2010-11-05 ENCOUNTER — Telehealth: Payer: Self-pay | Admitting: Pulmonary Disease

## 2010-11-05 MED ORDER — AMOXICILLIN-POT CLAVULANATE 875-125 MG PO TABS: ORAL_TABLET | ORAL | Status: DC

## 2010-11-05 NOTE — Telephone Encounter (Signed)
Per SN----ok to give pt augmentin 875mg   #14  1 po bid until gone. thanks

## 2010-11-05 NOTE — Telephone Encounter (Signed)
PATIENT'S DAUGHTER CALLED FROM [facility].  PLEASE CALL HER AND LEAVE VM WHEN PRESCRIPTION HAS BEEN CALLED.  HER PHONE IS [phone]

## 2010-11-05 NOTE — Telephone Encounter (Signed)
Spoke with pt. She is c/o prod cough with white to yellow sputum, increased SOB, and aches. She states has chills also but unsure if running fever. She declined ov with TP for tomorrow, stating that she prefers to keep her planned ov with SN on 11/12/10. Would like ABX called in. SN pls advise, thanks! Allergies  Allergen Reactions  . Codeine     REACTION: change in mental status  . Diphenhydramine Hcl     REACTION: itching  . Quinine     REACTION: abnormal sensations in face  . Sulfonamide Derivatives     REACTION: rash

## 2010-11-05 NOTE — Telephone Encounter (Signed)
Will forward msg back to SN so msg can be addressed.

## 2010-11-05 NOTE — Telephone Encounter (Signed)
Called, spoke with pt.  She is aware Sn recs she take augmentin 875mg  1 po bid until gone.  She is aware this will be sent to [facility] and I will also call her daughter to inform her of this.  She verbalized understanding of this and voiced no further questions/concerns at this time.  I called pt's daughter, Estill Bamberg, at number provided below.  She is aware of SN's recs and aware rx sent to [facility].

## 2010-11-12 ENCOUNTER — Ambulatory Visit (INDEPENDENT_AMBULATORY_CARE_PROVIDER_SITE_OTHER): Payer: Medicare Other

## 2010-11-12 ENCOUNTER — Encounter: Payer: Self-pay | Admitting: Pulmonary Disease

## 2010-11-12 ENCOUNTER — Ambulatory Visit (INDEPENDENT_AMBULATORY_CARE_PROVIDER_SITE_OTHER): Payer: Medicare Other | Admitting: Pulmonary Disease

## 2010-11-12 DIAGNOSIS — E785 Hyperlipidemia, unspecified: Secondary | ICD-10-CM

## 2010-11-12 DIAGNOSIS — K219 Gastro-esophageal reflux disease without esophagitis: Secondary | ICD-10-CM

## 2010-11-12 DIAGNOSIS — M159 Polyosteoarthritis, unspecified: Secondary | ICD-10-CM

## 2010-11-12 DIAGNOSIS — N259 Disorder resulting from impaired renal tubular function, unspecified: Secondary | ICD-10-CM

## 2010-11-12 DIAGNOSIS — E039 Hypothyroidism, unspecified: Secondary | ICD-10-CM

## 2010-11-12 DIAGNOSIS — D638 Anemia in other chronic diseases classified elsewhere: Secondary | ICD-10-CM

## 2010-11-12 DIAGNOSIS — I10 Essential (primary) hypertension: Secondary | ICD-10-CM

## 2010-11-12 DIAGNOSIS — F411 Generalized anxiety disorder: Secondary | ICD-10-CM

## 2010-11-12 DIAGNOSIS — E538 Deficiency of other specified B group vitamins: Secondary | ICD-10-CM

## 2010-11-12 DIAGNOSIS — J069 Acute upper respiratory infection, unspecified: Secondary | ICD-10-CM

## 2010-11-12 DIAGNOSIS — M810 Age-related osteoporosis without current pathological fracture: Secondary | ICD-10-CM

## 2010-11-12 DIAGNOSIS — J209 Acute bronchitis, unspecified: Secondary | ICD-10-CM

## 2010-11-12 DIAGNOSIS — D649 Anemia, unspecified: Secondary | ICD-10-CM

## 2010-11-12 DIAGNOSIS — E119 Type 2 diabetes mellitus without complications: Secondary | ICD-10-CM

## 2010-11-12 MED ORDER — CYANOCOBALAMIN 1000 MCG/ML IJ SOLN
1000.0000 ug | Freq: Once | INTRAMUSCULAR | Status: AC
  Administered 2010-11-12: 1000 ug via INTRAMUSCULAR

## 2010-11-12 NOTE — Patient Instructions (Signed)
Today we updated your med list in EPIC...     For your Bronchitis:    Finish the Augmentin, you do not appear to need another antibiotic...    Add OTC MUCINEX 600mg  tabs- 2 tabs twice daily w/ lots of fluids!!!    Use the new ADVAIR sample- one inhalation as demonstrate twice daily til they are gone...  We gave your your Vit B12 shot today...  Let's plan a recheck in 4-6 months, sooner if needed for problems.Marland KitchenMarland Kitchen

## 2010-11-12 NOTE — Progress Notes (Signed)
Subjective:    Patient ID: Jocelyn Mendez, female    DOB: 1930/12/05, 77 y.o.   MRN: [id number]  HPI 77 y/o WF here for a follow up visit... she has mult med problems including:  HBP, Hypercholesterolemia, DM, mild renal insuffic, & multifactorial anemia w/ B12 defic on shots monthly... she also has DJD, LBP, Osteopenia, etc...  ~  May 13, 2010:  6 month ROV & she notes incr anxiety & she wants to incr her Alprazolam to 0.25mg  Tid;  Also notes "staggering" & "chest pains"> she wants to see Neurology for eval of the former, and Cardiology to check the latter (see below)...    Cardiac>  BP controlled on Diovan/HCT & BP= 126/78 today w/o postural changes;  She is c/o dull squeezing CP that goes to her left arm on occas but is not really activity related, & quite variable;  She denies HA, visual symptoms, ch in SOB, edema, etc;  EKG shows NSR, borderline 1st degree AVB, poor R prog V1-4, NAD (unchanged from old tracings);  She has hx atypical CP in past (mostly sharp CWP treated w/ Tylenol) & had cath 1988 w/ norm coronaries & mild MVP seen;  Pt daughter wants a Cards consult...    Hyperlipid>  On Lip20 + Fenofib160, plus her low fat diet;  FLP is no better today w/ TChol 160, TG 258, HDL 19, LDL 100;  Needs better low fat diet, offered ch to Cres20 but cost a factor, didn't do any better on Lopid in past & reluctant to take Niacin due to flushing...    DM>  On Metformin500Bid & Glimep4mg /d and her home BS checks are elev in the 150-200 range she says;  Prev A1c's = 7-9 over the last yr;  BS today 290 & A1c=10.3;  We discussed need for much better diet, & we will need to incr her Metform to 1000mg  Bid & add Onglyza 5mg /d; she wants to avoid "the needle"...    Hypothy>  On Synthroid 150mcg/d & TSH is stable = 1.02 today, continue same dose...    Renal Insuffic>  Renal function is stable w/ BUN= 28, Creat= 1.4 today...    DJD & Gait Abn>  She is also c/o "staggering" all the time she says, by this she is  referring to a multifactorial gait abn (not dizzy, pre-syncopal, etc);  She has DJD, cerv & lumbar spondylosis, osteopenia, vit d defic, etc;  Daughter wants Neuro eval for her staggering prob> she had CHI 9/10, seen [facility] w/ CT showing atrophy & tiny SAH that resolved quickly...    Anemia>  She has pernicious anemia on B12 shots monthly & a multifactorial anemia for which she takes Fe, Vit C, MVI;  Hg=10.3, MCV=85, continue same Rx...    Anxiety>  On Alprazolam 0,25mg  but only taking 1/2 tab prn;  rec to incr dose to 1 tab tid...  ~  May 22, 2010:  Family call for an urgent add-on appt due to apparent gastroenteritis w/ N V D> nausea x 3-4d w/ fair intake, vomit x1 but keeping fluids down, & diarrhea x 4-5 times daily for several days but stopped last PM & no BM today;  She denies F C S, states appetite is good, just feeling weak & BS are up & down;  otherw situation is as above, they needed a lot of reassurance & support, it does not appear as though the Metformin was causing the diarrhea as it has now stopped w/o ch in  meds.  ~  Jul 04, 2010:  s/p Hosp by [facility] in April & May for dehydration, renal insuffic, etc; then UTI (sens EColi), weakness, DM, ?type 4 RTA> see DCSummary...  Told she has kidney disease & they have appt w/ Nephrology pending- set up by the hospitalists; they also want appt w/ diabetic specialist- we will arrange this per their request...  ~  August 12, 2010:  30mo ROV & review> She will now be receiving her care by committee:  Seen by Nephrology recently (note pending) & she tells me that "they stopped the Metformin & raised the little blue pill to 1&1/2 daily";  She has appt w/ DrKerr for DM/Endocrine July 24 & her home BS checks have all been in the 200's (currently on [place]);  BP remains controlled on meds as below;  Lipids fair on Lip20 & Fenofib160 but needs better low fat diet & incr exerc;  Thyroid stable on 145mcg/d;  See prob list>>  ~  November 12, 2010:  22mo ROV> she  requestwed add-on appt for "cold" w/ cough, yellow sput, chest congestion; ?low grade fever w/o chills or sweats; Augmnentin called in for pt & she is improved, also rec to take Mucinex 2Bid + Fluids po...    AB w/ exac> as above, finish Augmentin, plus the Mucinex, Fluids, etc...    HBP> on Norvasc; BP= 128/62 & stable; denies CP, palpit, ch in dyspnea or edema...    DM> on LEVEMIR & Onglyza 2.5mg /d; followed by DrKerr now & his note of 8/12 is reviewed- 56u/d w/ adjustment schedule; last A1c was 8.6==>11.4    Hyperlipid> on Lip20 + Fenofib160; needs to ret fasting for f/u FLP...    Hypothyroid> on Synthroid 180mcg/d; TSH has been controlled on this dose & f/u due on ret if not already done by endocrine...    GI> GERD, Divertics> on Nexium prn + Miralax & Senakot-S...    Renal Insuffic> followed by Nephrology Switzer & her note of 6/12 is reviewed...    DJD, LBP, Osteopenia> she knows to avoid NSAIDs & uses Tylenol as needed + Vit D supplement...    Anxiety> she is not currently on an anxiolytic agent even though I have rec low dose Alpraz in the past...    Anemia & B12 Defic> on Fe orally & B12 shots monthly; Hg stable in the 10-11 range...           Problem List:  Hx of ASTHMATIC BRONCHITIS, ACUTE (ICD-466.0) - no recent bronchitic episodes... the increased activity has really helped her... ~  CXR 5/12 showed clear lungs x mild scarring left base...  HYPERTENSION (ICD-401.9) - prev on Diovan/HCT but this was stopped 4/12 Hosp & now on NORVASC 5mg /d...  Hx of CHEST PAIN (ICD-786.50) - on ASA 81mg /d;  she continues to experience intermittent sharp left CWP- Rx w/ Tylenol Prn. ~  cath 1988 showed normal coronaries, min MVP...  DM (ICD-250.00) - on GLIMEPIRIDE 4mg /d + ONGLYZA 5mg /d (off her prev Metformin rx since 4/12 hosp)... ~  labs 8/08 showed BS= 113, HgA1c=5.4.Marland KitchenMarland Kitchen On Metform + Glimep. ~  labs 4/09 showed BS= 99, HgA1c= 5.5.Marland KitchenMarland Kitchen ~  labs 4/10 showed BS= 125, A1c= 7.1.Marland Kitchen. rec> better  diet, same meds for now. ~  labs 4/11 showed BS= 266, A1c= 9.1.Marland KitchenMarland Kitchen what happened? Add Glimep1mg /d back. ~  labs 6/11 showed BS= 195, A1c= 7.9.Marland KitchenMarland Kitchen incr Glimep to 2mg /d. ~  labs 10/11 showed BS= 175, A1c= 7.9.Marland KitchenMarland Kitchen rec incr Gimep to 4mg /d. ~  Labs in [place] 5/12 showed BS= 232==>139; A1c=9.7; Metform stopped in hosp, on [place]. ~  Labs 5/12 post hosp showed BS= 255, A1c= 8.6.Marland KitchenMarland Kitchen Referred to Endocrinology. ~  7/12:  BS at home all in the 200s, she doesn't want additional meds, wants to wait for appt w/ DrKerr 7/24. ~  8/12:  She was seen by DrKerr & her started her on Levemir insulin + Onglyza 2.5mg /d ==> his notes & labs are reviewed...  HYPERLIPIDEMIA (ICD-272.4) - on LIPITOR 20mg /d & FENOFIBRATE 160mg /d (ch from Lopid 10/10). ~  FLP 8/08 showed TChol 129, TG 197, HDL 17, LDL 73... despite taking statin & fibrate drugs. ~  FLP 4/09 on Lip20+Tric145 showed TChol 127, TG 269, HDL 13, LDL 69... rec> ch Tricor to [phone]. ~  [place] 10/09 on Lip20+Trilip135 showed TChol 124, TG 247, HDL 10!, LDL 64... rec> ch Trilip to LopidBid. ~  FLP 4/10 on Lip20+LopidBid showed TChol 174, TG 239, HDL 38, LDL 103... rec> ch Lopid to Feno160 (she never did). ~  FLP 10/10 showed TChol 134, TG 256, HDL 10, LDL 76... change the Lopid to Fenofib160/d. ~  FLP 4/11 on Lip20+Feno160 showed TChol 163, TG 234, HDL 28, LDL 108... keep same, better diet! ~  FLP 10/11 on Lip20+Feno160 showed TChol 172, Tg 304, HDL 26, LDL 112... take meds regularly & better diet. ~  FLP 4/12 on Lip20+Feno160 showed TChol 160, TG 258, HDL 19, LDL 100... Needs better low fat diet.  HYPOTHYROIDISM (ICD-244.9) - on SYNTHROID 132mcg/d... ~  last TSH 1/08 = 0.41 ~  labs 4/09 showed TSH= 2.35 ~  labs 10/09 on Levothy125 showed TSH= 0.97 ~  labs 4/10 on Levothy125 showed TSH= 0.08... rec> decr [phone]. ~  labs 10/10 on Levothy112 showed TSH= 2.18... keep same. ~  labs 4/11 on Levo112 showed TSH= 0.51 ~  Labs 4/12 on Levo112 showed TSH=  1.02  GERD (ICD-530.81) - takes NEXIUM 40mg  Prn (states Omep20 "makes me sick")...  ~  EGD was planned in 2006 eval but never done...  DIVERTICULOSIS OF COLON (ICD-562.10) - c/o constip & rec> Miralax + Senakot-S... ~  last colonoscopy 3/06 by DrDBrodie showed divertics only...  RENAL INSUFFICIENCY (ICD-588.9) - prev Creat in the 1.7 - 2.0 range... ~  labs 8/08 showed BUN=26, Creat=1.5 ~  labs 4/09 showed BUN= 34, Creat= 1.5 ~  labs 10/09 showed BUN= 31, Creat= 1.5 ~  labs 4/10 showed BUN= 28, Creat= 1.0, Microalb= pos... continue Rx (on Diovan). ~  labs 10/10 showed BUN= 22, Creat= 1.2 ~  labs 4/11 showed BUN= 20, Creat= 1.5 ~  labs 6/11 showed BUN= 20, Creat= 1.3 ~  labs 10/11 showed BUN= 27, Creat= 1.5 ~  Renal Ultrasound 5/12 was neg- NAD, no hydronephrosis... ~  Labs in [facility] 5/12 showed Creat= 2.11==>1.4 ~  Labs 5/12 post hosp showed BUN=44, Creat=1.7, K=5.7, HCO3=23;  She has appt w/ Nephrology set up by the Hospitalists. ~  6/12:  Pt seen by [facility] & her notes are reviewed...  DEGENERATIVE JOINT DISEASE, GENERALIZED (ICD-715.00) - she uses Tylenol Prn. LOW BACK PAIN (ICD-724.2) OSTEOPOROSIS (ICD-733.00) - she takes calcium + vits daily... ~  BMD here 2/06 showed TScores -1.4 Spine,  -1.1 left FemNeck... ~  labs 4/09 showed Vit D level= 24... rec OTC Vit D 1000 u daily, but she stopped this. ~  labs 4/10 showed Vit D level = 20... rec> incr OTC Vit D 2000 u daily (she never did). ~  labs 4/11 showed  Vit D level = 22...Marland KitchenMarland KitchenMarland Kitchen rec> incr to 2000 u daily. ~  Labs 4/12 showed Vit D level = 32  HEADACHE (ICD-784.0)  ANXIETY (ICD-300.00) - she notes her Bro has emphysema, lung cancer and CHF... she takes ALPRAZOLAM 0.5mg  Prn.  ANEMIA>  Hx ACD & IronDefic>  Rx FeSO4 + VitC ~  Hx of multifactorial anemia w/ Hg=11 in 2005, Fe was OK, Stools neg for blood, Folate norm, B12 found low, & Creat 1.5+ ~  labs 1/08 showed Hg=11.1 ~  labs 8/08 showed Hg=9.9 ~  labs 4/09 showed Hg=  9.6, Fe=102, TIBC=344, Sat=21% ~  labs 10/09 showed Hg= 9.1, MCV= 92 ~  labs 4/10 showed = 11.1, Fe= 71 ~  labs 10/10 showed Hg= 9.3, MCV= 93... rec Fe Bid w/ Vit C... ~  labs 4/11 showed Hg= 11.8, MCV=87, Fe=86 ~  labs 10/11 showed Hg= 10.9, MCV= 89 ~  Labs in [facility] 5/12 showed Hg= 10.3==>8.6, Fe=19 (5%sat), B12=505, Folate=16, SPE= neg.  VITAMIN B12 DEFICIENCY (ICD-266.2) - Vit B12 level was 77 in 2006 w/ pos parietal cell antibodies; she's been on B12 shots ever since then...  ~  f/u B12 level 4/10 = >1500... ~  f/u B12 level 5/12= 505   Past Surgical History  Procedure Date  . Appendectomy     Outpatient Encounter Prescriptions as of 11/12/2010  Medication Sig Dispense Refill  . amLODipine (NORVASC) 5 MG tablet Take 1 tablet (5 mg total) by mouth daily.  30 tablet  11  . aspirin 81 MG tablet Take 81 mg by mouth daily.        Marland Kitchen atorvastatin (LIPITOR) 20 MG tablet Take 1 tablet (20 mg total) by mouth daily.  30 tablet  6  . Cholecalciferol (VITAMIN D3) 3000 UNITS TABS Take 2 capsules by mouth daily.       . cyanocobalamin (,VITAMIN B-12,) 1000 MCG/ML injection Inject 1 mL (1,000 mcg total) into the muscle every 30 (thirty) days.  1 mL  11  . esomeprazole (NEXIUM) 40 MG capsule Take 1 capsule (40 mg total) by mouth daily before breakfast.  30 capsule  11  . fenofibrate 160 MG tablet Take 1 tablet (160 mg total) by mouth daily.  30 tablet  11  . ferrous sulfate 325 (65 FE) MG EC tablet Take 325 mg by mouth daily with breakfast.        . glucose blood test strip 1 each by Other route as needed. Use as instructed       . levothyroxine (SYNTHROID, LEVOTHROID) 112 MCG tablet Take 1 tablet (112 mcg total) by mouth daily.  30 tablet  3  . saxagliptin HCl (ONGLYZA) 2.5 MG TABS tablet Take 2.5 mg by mouth daily.        Marland Kitchen DISCONTD: ALPRAZolam (XANAX) 0.25 MG tablet Take 1 tablet (0.25 mg total) by mouth 3 (three) times daily as needed for anxiety.  90 tablet  5  . DISCONTD:  amoxicillin-clavulanate (AUGMENTIN) 875-125 MG per tablet Take 1 tablet by mouth twice daily until gone  14 tablet  0  . DISCONTD: glimepiride (AMARYL) 4 MG tablet        . DISCONTD: saxagliptin HCl (ONGLYZA) 5 MG TABS tablet Take 1 tablet (5 mg total) by mouth daily.  30 tablet  11    Allergies  Allergen Reactions  . Codeine     REACTION: change in mental status  . Diphenhydramine Hcl     REACTION: itching  . Quinine  REACTION: abnormal sensations in face  . Sulfonamide Derivatives     REACTION: rash    Review of Systems        See HPI - all other systems neg except as noted... The patient complains of nausea & diarrhea- now resolved.  The patient denies anorexia, fever, weight loss, weight gain, vision loss, decreased hearing, hoarseness, syncope, peripheral edema, prolonged cough, headaches, hemoptysis, abdominal pain, melena, hematochezia, severe indigestion/heartburn, hematuria, incontinence, suspicious skin lesions, transient blindness, depression, unusual weight change, abnormal bleeding, enlarged lymph nodes, and angioedema.     Objective:   Physical Exam      WD, WN, 77 y/o WF in NAD...  VS reviewed & BP= 120/64 w/o postural changes... GENERAL:  Alert & oriented; pleasant & cooperative... HEENT:  [place]/AT, EOM-wnl, PERRLA, EACs-clear, TMs- sl red on left, NOSE-clear, THROAT-clear & wnl. NECK:  Decr ROM; no JVD; normal carotid impulses w/o bruits; no thyromegaly or nodules palpated; no lymphadenopathy. CHEST:  Clear to P & A; without wheezes/ rales/ or rhonchi heard... HEART:  Regular Rhythm; without murmurs/ rubs/ or gallops detected... ABDOMEN:  Soft & nontender; normal bowel sounds; no organomegaly or masses palpated... EXT: without deformities, mild arthritic changes; no varicose veins/ +venous insuffic/ no edema. NEURO:  CN's intact; no focal neuro deficits or weakness found... DERM:  no lesions noted...  RADIOLOGY DATA:  Reviewed in the EPIC EMR & discussed w/ the  patient...  LABORATORY DATA:  Reviewed in the EPIC EMR & discussed w/ the patient...   Assessment & Plan:   AB>  Improved on the Augmentin + Mucinex, Fluids, etc...  Hypertension>  Her BP remains controlled, on Amlodipine monotherapy, & specifically her BP is not too low & there are no postural changes;  Continue same med...  Hx of CP>  She was seen 05/13/10 for a routine 51mo check up & daugh mentioned her atypCP & wanted cardiac eval which is pending;  Pt did not mention chest discomfort during this visit but she is 77y/o w/ mult cardiac risk factors & prev EKG w/ poor R progression & NSSTTWA...  Diabetes>  Now followed & managed by drKerr [place]...  Hyperlipidemia>  On Lipitor & Fenofibrate, needs better low fat diet, continue same meds...  Renal Insuffic>  She was seen by Nephrology 6/12 & they plan f/u w/ labs in Oct...  Hypothyroid>  Stable on Levothyroid 153mcg/d...   Anemia>  Multifactorial, on B12 shots monthly & Fe/ VitC supplementation...  Anxiety>  She stopped her prn Alprazolam..Marland Kitchen

## 2010-11-24 ENCOUNTER — Encounter: Payer: Self-pay | Admitting: Pulmonary Disease

## 2011-01-03 ENCOUNTER — Other Ambulatory Visit: Payer: Self-pay | Admitting: *Deleted

## 2011-01-03 MED ORDER — ATORVASTATIN CALCIUM 20 MG PO TABS: 20.0000 mg | ORAL_TABLET | Freq: Every day | ORAL | Status: DC

## 2011-03-05 ENCOUNTER — Telehealth: Payer: Self-pay | Admitting: Pulmonary Disease

## 2011-03-05 MED ORDER — LEVOTHYROXINE SODIUM 112 MCG PO TABS: 112.0000 ug | ORAL_TABLET | Freq: Every day | ORAL | Status: DC

## 2011-03-05 NOTE — Telephone Encounter (Signed)
Refill sent. Pt is aware. Jennifer Castillo, CMA  

## 2011-03-17 ENCOUNTER — Ambulatory Visit (INDEPENDENT_AMBULATORY_CARE_PROVIDER_SITE_OTHER): Payer: Medicare Other | Admitting: Pulmonary Disease

## 2011-03-17 ENCOUNTER — Encounter: Payer: Self-pay | Admitting: Pulmonary Disease

## 2011-03-17 DIAGNOSIS — D638 Anemia in other chronic diseases classified elsewhere: Secondary | ICD-10-CM

## 2011-03-17 DIAGNOSIS — I10 Essential (primary) hypertension: Secondary | ICD-10-CM

## 2011-03-17 DIAGNOSIS — K219 Gastro-esophageal reflux disease without esophagitis: Secondary | ICD-10-CM

## 2011-03-17 DIAGNOSIS — E538 Deficiency of other specified B group vitamins: Secondary | ICD-10-CM

## 2011-03-17 DIAGNOSIS — M159 Polyosteoarthritis, unspecified: Secondary | ICD-10-CM

## 2011-03-17 DIAGNOSIS — F411 Generalized anxiety disorder: Secondary | ICD-10-CM

## 2011-03-17 DIAGNOSIS — M810 Age-related osteoporosis without current pathological fracture: Secondary | ICD-10-CM

## 2011-03-17 DIAGNOSIS — K5730 Diverticulosis of large intestine without perforation or abscess without bleeding: Secondary | ICD-10-CM

## 2011-03-17 DIAGNOSIS — E119 Type 2 diabetes mellitus without complications: Secondary | ICD-10-CM

## 2011-03-17 DIAGNOSIS — N259 Disorder resulting from impaired renal tubular function, unspecified: Secondary | ICD-10-CM

## 2011-03-17 DIAGNOSIS — E039 Hypothyroidism, unspecified: Secondary | ICD-10-CM

## 2011-03-17 DIAGNOSIS — E785 Hyperlipidemia, unspecified: Secondary | ICD-10-CM

## 2011-03-17 MED ORDER — CYANOCOBALAMIN 1000 MCG/ML IJ SOLN
1000.0000 ug | Freq: Once | INTRAMUSCULAR | Status: AC
  Administered 2011-03-17: 1000 ug via INTRAMUSCULAR

## 2011-03-17 NOTE — Patient Instructions (Signed)
Today we updated your med list in our EPIC system...    Continue your current medications the same...  We gave you a B12shot today & wrote a new prescription per your request...  Please return to our lab one morning this week for your follow up fasting blood work...    Then call the [facility] in a few days for your results...    Dial [id number] & when prompted enter your patient number followed by the # symbol...    Your patient number is:  [id number]  Call for any questions...  Lets plan a follow up visit in about 6 months (to space the visits between Herman, [employer], & myself).Marland KitchenMarland Kitchen

## 2011-03-17 NOTE — Progress Notes (Signed)
Subjective:    Patient ID: Jocelyn Mendez, female    DOB: 20-Apr-1930, 78 y.o.   MRN: [id number]  HPI 78 y/o WF here for a follow up visit... she has mult med problems including:  HBP, Hypercholesterolemia, DM, mild renal insuffic, & multifactorial anemia w/ B12 defic on shots monthly... she also has DJD, LBP, Osteopenia, etc...  ~  May 13, 2010:  6 month ROV & she notes incr anxiety & she wants to incr her Alprazolam to 0.25mg  Tid;  Also notes "staggering" & "chest pains"> she wants to see Neurology for eval of the former, and Cardiology to check the latter (see below)...    Cardiac>  BP controlled on Diovan/HCT & BP= 126/78 today w/o postural changes;  She is c/o dull squeezing CP that goes to her left arm on occas but is not really activity related, & quite variable;  She denies HA, visual symptoms, ch in SOB, edema, etc;  EKG shows NSR, borderline 1st degree AVB, poor R prog V1-4, NAD (unchanged from old tracings);  She has hx atypical CP in past (mostly sharp CWP treated w/ Tylenol) & had cath 1988 w/ norm coronaries & mild MVP seen;  Pt daughter wants a Cards consult...    Hyperlipid>  On Lip20 + Fenofib160, plus her low fat diet;  FLP is no better today w/ TChol 160, TG 258, HDL 19, LDL 100;  Needs better low fat diet, offered ch to Cres20 but cost a factor, didn't do any better on Lopid in past & reluctant to take Niacin due to flushing...    DM>  On Metformin500Bid & Glimep4mg /d and her home BS checks are elev in the 150-200 range she says;  Prev A1c's = 7-9 over the last yr;  BS today 290 & A1c=10.3;  We discussed need for much better diet, & we will need to incr her Metform to 1000mg  Bid & add Onglyza 5mg /d; she wants to avoid "the needle"...    Hypothy>  On Synthroid 130mcg/d & TSH is stable = 1.02 today, continue same dose...    Renal Insuffic>  Renal function is stable w/ BUN= 28, Creat= 1.4 today...    DJD & Gait Abn>  She is also c/o "staggering" all the time she says, by this she is  referring to a multifactorial gait abn (not dizzy, pre-syncopal, etc);  She has DJD, cerv & lumbar spondylosis, osteopenia, vit d defic, etc;  Daughter wants Neuro eval for her staggering prob> she had CHI 9/10, seen [facility] w/ CT showing atrophy & tiny SAH that resolved quickly...    Anemia>  She has pernicious anemia on B12 shots monthly & a multifactorial anemia for which she takes Fe, Vit C, MVI;  Hg=10.3, MCV=85, continue same Rx...    Anxiety>  On Alprazolam 0,25mg  but only taking 1/2 tab prn;  rec to incr dose to 1 tab tid...  ~  May 22, 2010:  Family call for an urgent add-on appt due to apparent gastroenteritis w/ N V D> nausea x 3-4d w/ fair intake, vomit x1 but keeping fluids down, & diarrhea x 4-5 times daily for several days but stopped last PM & no BM today;  She denies F C S, states appetite is good, just feeling weak & BS are up & down;  otherw situation is as above, they needed a lot of reassurance & support, it does not appear as though the Metformin was causing the diarrhea as it has now stopped w/o ch in  meds.  ~  Jul 04, 2010:  s/p Hosp by [facility] in April & May for dehydration, renal insuffic, etc; then UTI (sens EColi), weakness, DM, ?type 4 RTA> see DCSummary...  Told she has kidney disease & they have appt w/ Nephrology pending- set up by the hospitalists; they also want appt w/ diabetic specialist- we will arrange this per their request...  ~  August 12, 2010:  58mo ROV & review> She will now be receiving her care by committee:  Seen by Nephrology recently (note pending) & she tells me that "they stopped the Metformin & raised the little blue pill to 1&1/2 daily";  She has appt w/ DrKerr for DM/Endocrine July 24 & her home BS checks have all been in the 200's (currently on [place]);  BP remains controlled on meds as below;  Lipids fair on Lip20 & Fenofib160 but needs better low fat diet & incr exerc;  Thyroid stable on 1109mcg/d;  See prob list>>  ~  November 12, 2010:  49mo ROV> she  requested add-on appt for "cold" w/ cough, yellow sput, chest congestion; ?low grade fever w/o chills or sweats; Augmentin called in for pt & she is improved, also rec to take Mucinex 2Bid + Fluids po...  BP controlled on Amlodipine;  DM under treatment from [place] on [place] but A1c still out of control;  Lipids regulated by [place]- needs ret for FLP;  Thyroid stable on Synthroid112;  Other problems as noted below...  ~  March 17, 2011:  33mo ROV & she notes doing better, no new complaints or concerns; followed by DrKerr every 54mo & DrGoldsborough every 6 months>    HBP> on Norvasc5; BP= 130/70 & stable; denies CP, palpit, ch in dyspnea or edema...    DM> on LEVEMIR (now 46uBid) & Onglyza 2.5mg /d; followed by DrKerr & his note of 10/12 is reviewed- A1c= 9.0 then while on 80u Levemir/d... LABS TODAY show FBS=229, A1c=11.0, and copies sent to DrKerr for his review...    Hyperlipid> on Lip20 + Fenofib160; needs to ret fasting for f/u FLP ==> TChol 147, TG 165, HDL 30, LDL 85; rec better low fat diet & incr exercise...    Hypothyroid> on Synthroid 134mcg/d; TSH had been controlled on this dose but f/u TSH= 20.8 & pharm confirms regular refills! This really doesn't make much sense but we will incr Synthroid to 187mcg/d dose...     GI> GERD, Divertics> on Nexium prn + Miralax & Senakot-S...    Renal Insuffic> followed by Nephrology [place] & her note of 10/12 is reviewed & Creat was 1.8.Marland KitchenMarland Kitchen    DJD, LBP, Osteopenia> she knows to avoid NSAIDs & uses Tylenol as needed + Vit D supplement...    Anxiety> she is not currently on an anxiolytic agent even though I have rec low dose Alpraz in the past...    Anemia & B12 Defic> on Fe orally & B12 shots monthly (she notes her Pharm was out of parenteral B12 recently); Hg stable in the 10-12 range...          Problem List:  Hx of ASTHMATIC BRONCHITIS, ACUTE (ICD-466.0) - no recent bronchitic episodes... the increased activity has really  helped her... ~  CXR 5/12 showed clear lungs x mild scarring left base & apical pleural thickening...  HYPERTENSION (ICD-401.9) - prev on Diovan/HCT but this was stopped 4/12 Hosp & now on NORVASC 5mg /d... ~  10/12:  BP= 128/62 & she denies CP, palpit, syncope, SOB,  edema... ~  2/13:  BP= 130/70 & she feels well & denies symptoms...  Hx of CHEST PAIN (ICD-786.50) - on ASA 81mg /d;  she continues to experience intermittent sharp left CWP- Rx w/ Tylenol Prn. ~  cath 1988 showed normal coronaries, min MVP... ~  Cards eval 4/12 by DrNishan> mult risk factors, atypCP, EKG w/ NSSTTWA, planned Myoview but not done due to subseq hosp 4/12...  DM (ICD-250.00) - followed by DrKerr> DM w/ Nephropathy & Neuropathy> on LEVEMIR 46u Bid now + ONGLYZA 2.5mg /d (off Metformin rx since 4/12 hosp)... ~  labs 8/08 showed BS= 113, HgA1c=5.4.Marland KitchenMarland Kitchen On Metform + Glimep. ~  labs 4/09 showed BS= 99, HgA1c= 5.5.Marland KitchenMarland Kitchen Stopped Glimep. ~  labs 4/10 showed BS= 125, A1c= 7.1.Marland Kitchen. rec> better diet, same meds for now. ~  labs 4/11 showed BS= 266, A1c= 9.1.Marland KitchenMarland Kitchen what happened? Add Glimep1mg /d back. ~  labs 6/11 showed BS= 195, A1c= 7.9.Marland KitchenMarland Kitchen incr Glimep to 2mg /d. ~  labs 10/11 showed BS= 175, A1c= 7.9.Marland KitchenMarland Kitchen rec incr Gimep to 4mg /d. ~  Labs in [facility] 5/12 showed BS= 232==>139; A1c=9.7; Metform stopped in hosp, on [place]. ~  Labs 5/12 post hosp showed BS= 255, A1c= 8.6.Marland KitchenMarland Kitchen Referred to Endocrinology. ~  7/12:  BS at home all in the 200s, she doesn't want additional meds, wants to wait for appt w/ DrKerr 7/24. ~  8/12:  She was seen by DrKerr & her started her on Levemir insulin + Onglyza 2.5mg /d ==> his notes & labs are reviewed... ~  10/12: Note from DrKerr> pt on Levemir 80u + Onglyza 2.5mg /d w/ A1c= 9.0.Marland Kitchen. ~  Labs here 2/13 showed FBS= 229, A1c= 11.0 & we will fax to DrKerr for his review...  HYPERLIPIDEMIA (ICD-272.4) - on LIPITOR 20mg /d & FENOFIBRATE 160mg /d (ch from Lopid 10/10). ~  FLP 8/08 showed TChol 129, TG 197, HDL 17, LDL  73... despite taking statin & fibrate drugs. ~  FLP 4/09 on Lip20+Tric145 showed TChol 127, TG 269, HDL 13, LDL 69... rec> ch Tricor to [phone]. ~  [place] 10/09 on Lip20+Trilip135 showed TChol 124, TG 247, HDL 10!, LDL 64... rec> ch Trilip to LopidBid. ~  FLP 4/10 on Lip20+LopidBid showed TChol 174, TG 239, HDL 38, LDL 103... rec> ch Lopid to Feno160 (she never did). ~  FLP 10/10 showed TChol 134, TG 256, HDL 10, LDL 76... change the Lopid to Fenofib160/d. ~  FLP 4/11 on Lip20+Feno160 showed TChol 163, TG 234, HDL 28, LDL 108... keep same, better diet! ~  FLP 10/11 on Lip20+Feno160 showed TChol 172, Tg 304, HDL 26, LDL 112... take meds regularly & better diet. ~  FLP 4/12 on Lip20+Feno160 showed TChol 160, TG 258, HDL 19, LDL 100... Needs better low fat diet. ~  FLP 2/13 on Lip20+Feno160 showed TChol 147, TG 165, HDL 30, LDL 85  HYPOTHYROIDISM (ICD-244.9) - Hx remote Thyroid surg & scar in neck> on SYNTHROID 186mcg/d... ~  last TSH 1/08 = 0.41 ~  labs 4/09 showed TSH= 2.35 ~  labs 10/09 on Levothy125 showed TSH= 0.97 ~  labs 4/10 on Levothy125 showed TSH= 0.08... rec> decr [phone]. ~  labs 10/10 on Levothy112 showed TSH= 2.18... keep same. ~  labs 4/11 on Levo112 showed TSH= 0.51 ~  Labs 4/12 on Levo112 showed TSH= 1.02 ~  Labs 2/13 on Levo112 showed TSH= 20.8.Marland KitchenMarland Kitchen Why? Pharm confirms regular refills, we will incr dose to 125mcg/d.  GERD (ICD-530.81) - takes NEXIUM 40mg  Prn (states Omep20 "makes me sick")...  ~  EGD was  planned in 2006 eval but never done...  DIVERTICULOSIS OF COLON (ICD-562.10) - c/o constip & rec> Miralax + Senakot-S... ~  last colonoscopy 3/06 by DrDBrodie showed divertics only...  RENAL INSUFFICIENCY (ICD-588.9) - prev Creat in the 1.7 - 2.0 range... ~  labs 8/08 showed BUN=26, Creat=1.5 ~  labs 4/09 showed BUN= 34, Creat= 1.5 ~  labs 10/09 showed BUN= 31, Creat= 1.5 ~  labs 4/10 showed BUN= 28, Creat= 1.0, Microalb= pos... continue Rx (on Diovan). ~  labs 10/10  showed BUN= 22, Creat= 1.2 ~  labs 4/11 showed BUN= 20, Creat= 1.5 ~  labs 6/11 showed BUN= 20, Creat= 1.3 ~  labs 10/11 showed BUN= 27, Creat= 1.5 ~  Renal Ultrasound 5/12 was neg- NAD, no hydronephrosis... ~  Labs in [facility] 5/12 showed Creat= 2.11==>1.4 ~  Labs 5/12 post hosp showed BUN=44, Creat=1.7, K=5.7, HCO3=23;  She has appt w/ Nephrology set up by the Hospitalists. ~  6/12 & 10/12:  Pt seen by DrGoldsborough & her notes are reviewed... ~  Labs 2/13 showed BUN= 33, Creat= 1.6  DEGENERATIVE JOINT DISEASE, GENERALIZED (ICD-715.00) - she uses Tylenol Prn. LOW BACK PAIN (ICD-724.2) OSTEOPOROSIS (ICD-733.00) - she takes calcium + vits daily... ~  BMD here 2/06 showed TScores -1.4 Spine,  -1.1 left FemNeck... ~  labs 4/09 showed Vit D level= 24... rec OTC Vit D 1000 u daily, but she stopped this. ~  labs 4/10 showed Vit D level = 20... rec> incr OTC Vit D 2000 u daily (she never did). ~  labs 4/11 showed Vit D level = 22...Marland KitchenMarland KitchenMarland Kitchen rec> incr to 2000 u daily. ~  Labs 4/12 showed Vit D level = 32  HEADACHE (ICD-784.0)  ANXIETY (ICD-300.00) - she notes her Bro has emphysema, lung cancer and CHF... she takes ALPRAZOLAM 0.5mg  Prn.  ANEMIA>  Hx ACD & IronDefic>  Rx FeSO4 + VitC ~  Hx of multifactorial anemia w/ Hg=11 in 2005, Fe was OK, Stools neg for blood, Folate norm, B12 found low, & Creat 1.5+ ~  labs 1/08 showed Hg=11.1 ~  labs 8/08 showed Hg=9.9 ~  labs 4/09 showed Hg= 9.6, Fe=102, TIBC=344, Sat=21% ~  labs 10/09 showed Hg= 9.1, MCV= 92 ~  labs 4/10 showed = 11.1, Fe= 71 ~  labs 10/10 showed Hg= 9.3, MCV= 93... rec Fe Bid w/ Vit C... ~  labs 4/11 showed Hg= 11.8, MCV=87, Fe=86 ~  labs 10/11 showed Hg= 10.9, MCV= 89 ~  Labs in [facility] 5/12 showed Hg= 10.3==>8.6, Fe=19 (5%sat), B12=505, Folate=16, SPE= neg. ~  Labs 2/13 showed Hg= 12.0  VITAMIN B12 DEFICIENCY (ICD-266.2) - Vit B12 level was 77 in 2006 w/ pos parietal cell antibodies; she's been on B12 shots ever since then...  ~  f/u B12  level 4/10 = >1500 ~  f/u B12 level 5/12= 505 ~  She remains on monthly Vit B12 shots...   Past Surgical History  Procedure Date  . Appendectomy     Outpatient Encounter Prescriptions as of 03/17/2011  Medication Sig Dispense Refill  . amLODipine (NORVASC) 5 MG tablet Take 1 tablet (5 mg total) by mouth daily.  30 tablet  11  . aspirin 81 MG tablet Take 81 mg by mouth daily.        Marland Kitchen atorvastatin (LIPITOR) 20 MG tablet Take 1 tablet (20 mg total) by mouth daily.  30 tablet  6  . Cholecalciferol (VITAMIN D3) 3000 UNITS TABS Take 2 capsules by mouth daily.       Marland Kitchen  cyanocobalamin (,VITAMIN B-12,) 1000 MCG/ML injection Inject 1 mL (1,000 mcg total) into the muscle every 30 (thirty) days.  1 mL  11  . esomeprazole (NEXIUM) 40 MG capsule Take 1 capsule (40 mg total) by mouth daily before breakfast.  30 capsule  11  . fenofibrate 160 MG tablet Take 1 tablet (160 mg total) by mouth daily.  30 tablet  11  . ferrous sulfate 325 (65 FE) MG EC tablet Take 325 mg by mouth daily with breakfast.        . glucose blood (BAYER CONTOUR TEST) test strip Use as instructed      . insulin detemir (LEVEMIR) 100 UNIT/ML injection Per Dr. Buddy Duty      . levothyroxine (SYNTHROID, LEVOTHROID) 112 MCG tablet Take 1 tablet (112 mcg total) by mouth daily.  30 tablet  6  . saxagliptin HCl (ONGLYZA) 2.5 MG TABS tablet Take 2.5 mg by mouth daily.        Marland Kitchen DISCONTD: glucose blood test strip 1 each by Other route as needed. Use as instructed         Allergies  Allergen Reactions  . Codeine     REACTION: change in mental status  . Diphenhydramine Hcl     REACTION: itching  . Quinine     REACTION: abnormal sensations in face  . Sulfonamide Derivatives     REACTION: rash    Current Medications, Allergies, Past Medical History, Past Surgical History, Family History, and Social History were reviewed in [employer] record.    Review of Systems        See HPI - all other systems neg except as  noted... The patient complains of nausea & diarrhea- now resolved.  The patient denies anorexia, fever, weight loss, weight gain, vision loss, decreased hearing, hoarseness, syncope, peripheral edema, prolonged cough, headaches, hemoptysis, abdominal pain, melena, hematochezia, severe indigestion/heartburn, hematuria, incontinence, suspicious skin lesions, transient blindness, depression, unusual weight change, abnormal bleeding, enlarged lymph nodes, and angioedema.     Objective:   Physical Exam      WD, WN, 78 y/o WF in NAD...  VS reviewed & BP= 120/64 w/o postural changes... GENERAL:  Alert & oriented; pleasant & cooperative... HEENT:  [place]/AT, EOM-wnl, PERRLA, EACs-clear, TMs- sl red on left, NOSE-clear, THROAT-clear & wnl. NECK:  Decr ROM; no JVD; normal carotid impulses w/o bruits; +thyroid scar, no thyromegaly or nodules palpated; no lymphadenopathy. CHEST:  Clear to P & A; without wheezes/ rales/ or rhonchi heard... HEART:  Regular Rhythm; without murmurs/ rubs/ or gallops detected... ABDOMEN:  Soft & nontender; normal bowel sounds; no organomegaly or masses palpated... EXT: without deformities, mild arthritic changes; no varicose veins/ +venous insuffic/ no edema. NEURO:  CN's intact; no focal neuro deficits or weakness found... DERM:  no lesions noted...  RADIOLOGY DATA:  Reviewed in the EPIC EMR & discussed w/ the patient...    >>Last CXR 5/12 showed clear lungs x mild scarring left base & apical pleural thickening...    >>Abd Sonar 4/12 showed bilat normal renal size, some bilat cortical thinning, no hydroneph, bladder ok...  LABORATORY DATA:  Reviewed in the EPIC EMR & discussed w/ the patient...   Assessment & Plan:   AB>  No recent exac & improved...  Hypertension>  Her BP remains controlled, on Amlodipine monotherapy, & specifically her BP is not too low & there are no postural changes;  Continue same med...  Hx of CP>  She was seen 05/13/10 for a routine 74mo  check up &  daugh mentioned her atypCP & wanted cardiac eval & seen by Cherly Hensen;  Pt did not mention chest discomfort during this visit but she is 79y/o w/ mult cardiac risk factors & prev EKG w/ poor R progression & NSSTTWA...  Diabetes>  Now followed & managed by DrKerr on [place]...  Hyperlipidemia>  On Lipitor & Fenofibrate, needs better low fat diet, continue same meds...  Renal Insuffic>  Followed by DrGoldsborough & creat is stable...  Hypothyroid>  Suddenly TSH is ~20 on same dose & this makes no sense at all; Pharm confirms filling Rx regularly therefore rec incr dose to 186mcg/d...  Anemia>  Multifactorial, on B12 shots monthly & Fe/ VitC supplementation...  Anxiety>  She stopped her prn Alprazolam...   Patient's Medications  New Prescriptions   No medications on file  Previous Medications   AMLODIPINE (NORVASC) 5 MG TABLET    Take 1 tablet (5 mg total) by mouth daily.   ASPIRIN 81 MG TABLET    Take 81 mg by mouth daily.     ATORVASTATIN (LIPITOR) 20 MG TABLET    Take 1 tablet (20 mg total) by mouth daily.   CHOLECALCIFEROL (VITAMIN D3) 3000 UNITS TABS    Take 2 capsules by mouth daily.    CYANOCOBALAMIN (,VITAMIN B-12,) 1000 MCG/ML INJECTION    Inject 1 mL (1,000 mcg total) into the muscle every 30 (thirty) days.   ESOMEPRAZOLE (NEXIUM) 40 MG CAPSULE    Take 1 capsule (40 mg total) by mouth daily before breakfast.   FENOFIBRATE 160 MG TABLET    Take 1 tablet (160 mg total) by mouth daily.   FERROUS SULFATE 325 (65 FE) MG EC TABLET    Take 325 mg by mouth daily with breakfast.     GLUCOSE BLOOD (BAYER CONTOUR TEST) TEST STRIP    Use as instructed   INSULIN DETEMIR (LEVEMIR) 100 UNIT/ML INJECTION    Per Dr. Buddy Duty   LEVOTHYROXINE (SYNTHROID, LEVOTHROID) 112 MCG TABLET    Take 1 tablet (112 mcg total) by mouth daily.   SAXAGLIPTIN HCL (ONGLYZA) 2.5 MG TABS TABLET    Take 2.5 mg by mouth daily.    Modified Medications   No medications on file  Discontinued Medications   GLUCOSE  BLOOD TEST STRIP    1 each by Other route as needed. Use as instructed

## 2011-03-19 ENCOUNTER — Other Ambulatory Visit (INDEPENDENT_AMBULATORY_CARE_PROVIDER_SITE_OTHER): Payer: Medicare Other

## 2011-03-19 DIAGNOSIS — E119 Type 2 diabetes mellitus without complications: Secondary | ICD-10-CM

## 2011-03-19 DIAGNOSIS — F411 Generalized anxiety disorder: Secondary | ICD-10-CM

## 2011-03-19 DIAGNOSIS — I10 Essential (primary) hypertension: Secondary | ICD-10-CM

## 2011-03-19 DIAGNOSIS — K5730 Diverticulosis of large intestine without perforation or abscess without bleeding: Secondary | ICD-10-CM

## 2011-03-19 DIAGNOSIS — E785 Hyperlipidemia, unspecified: Secondary | ICD-10-CM

## 2011-03-19 LAB — HEPATIC FUNCTION PANEL
ALT: 21 U/L (ref 0–35)
AST: 20 U/L (ref 0–37)
Albumin: 4 g/dL (ref 3.5–5.2)
Alkaline Phosphatase: 74 U/L (ref 39–117)
Total Protein: 7.2 g/dL (ref 6.0–8.3)

## 2011-03-19 LAB — BASIC METABOLIC PANEL
CO2: 25 mEq/L (ref 19–32)
Calcium: 9.7 mg/dL (ref 8.4–10.5)
Chloride: 107 mEq/L (ref 96–112)
Glucose, Bld: 229 mg/dL — ABNORMAL HIGH (ref 70–99)
Sodium: 138 mEq/L (ref 135–145)

## 2011-03-19 LAB — CBC WITH DIFFERENTIAL/PLATELET
Basophils Absolute: 0 10*3/uL (ref 0.0–0.1)
Eosinophils Absolute: 0.2 10*3/uL (ref 0.0–0.7)
Lymphocytes Relative: 29.5 % (ref 12.0–46.0)
Monocytes Relative: 13.2 % — ABNORMAL HIGH (ref 3.0–12.0)
Neutrophils Relative %: 52.2 % (ref 43.0–77.0)
Platelets: 196 10*3/uL (ref 150.0–400.0)
RDW: 13.9 % (ref 11.5–14.6)

## 2011-03-19 LAB — LIPID PANEL: HDL: 29.5 mg/dL — ABNORMAL LOW (ref >39.00)

## 2011-03-19 LAB — TSH: TSH: 20.78 u[IU]/mL — ABNORMAL HIGH (ref 0.35–5.50)

## 2011-03-20 ENCOUNTER — Other Ambulatory Visit: Payer: Self-pay | Admitting: *Deleted

## 2011-03-20 MED ORDER — LEVOTHYROXINE SODIUM 125 MCG PO CAPS: 125.0000 ug | ORAL_CAPSULE | Freq: Every day | ORAL | Status: DC

## 2011-04-01 ENCOUNTER — Telehealth: Payer: Self-pay | Admitting: Pulmonary Disease

## 2011-04-01 DIAGNOSIS — E039 Hypothyroidism, unspecified: Secondary | ICD-10-CM

## 2011-04-01 NOTE — Telephone Encounter (Signed)
Spoke with pt's daughter. She states wants to know when levothyroxine should be taken. I advised that this med is best absorbed on an empty stomach so first thing in the am preferred. She verbalized understanding. She states that she also wants pt to be referred to a different endocrinologist. Pt not comfortable with Dr. Buddy Duty. She wants someone within the [facility] system. Has not seen Loanne Drilling before. Is it okay to refer to him? Please advise, thanks!

## 2011-04-01 NOTE — Telephone Encounter (Signed)
Per SN okay to refer to Dr. Loanne Drilling. I have placed referral in epic. I spoke with Estill Bamberg and is aware of the referral and that someone will be contacting them for an apt. Nothing further was needed

## 2011-04-07 ENCOUNTER — Encounter: Payer: Self-pay | Admitting: Endocrinology

## 2011-04-07 ENCOUNTER — Ambulatory Visit (INDEPENDENT_AMBULATORY_CARE_PROVIDER_SITE_OTHER): Payer: Medicare Other | Admitting: Endocrinology

## 2011-04-07 DIAGNOSIS — E1065 Type 1 diabetes mellitus with hyperglycemia: Secondary | ICD-10-CM

## 2011-04-07 DIAGNOSIS — N058 Unspecified nephritic syndrome with other morphologic changes: Secondary | ICD-10-CM

## 2011-04-07 MED ORDER — TRIAMCINOLONE ACETONIDE 0.1 % EX CREA: TOPICAL_CREAM | Freq: Three times a day (TID) | CUTANEOUS | Status: AC

## 2011-04-07 MED ORDER — INSULIN GLARGINE 100 UNIT/ML ~~LOC~~ SOLN: 100.0000 [IU] | Freq: Every morning | SUBCUTANEOUS | Status: DC

## 2011-04-07 NOTE — Patient Instructions (Addendum)
i have sent a prescription to your pharmacy, for an anti-itch cream.   good diet and exercise habits significanly improve the control of your diabetes.  please let me know if you wish to be referred to a dietician.  high blood sugar is very risky to your health.  you should see an eye doctor every year. controlling your blood pressure and cholesterol drastically reduces the damage diabetes does to your body.  this also applies to quitting smoking.  please discuss these with your doctor.  you should take an aspirin every day, unless you have been advised by a doctor not to. check your blood sugar 2 times a day.  vary the time of day when you check, between before the 3 meals, and at bedtime.  also check if you have symptoms of your blood sugar being too high or too low.  please keep a record of the readings and bring it to your next appointment here.  please call us sooner if your blood sugar goes below 70, or if it stays over 200.   Stop onglyza. Change levemir to lantus, 100 units each morning. Please come back for a follow-up appointment in 2 weeks

## 2011-04-07 NOTE — Progress Notes (Signed)
Subjective:    Patient ID: Jocelyn Mendez, female    DOB: 30-Mar-1930, 78 y.o.   MRN: [id number]  HPI pt states 25 years h/o dm.  it is complicated by renal insuff.  he has been on insulin x 7 mos.  levemir has been increased, now to 46 units bid.  Despite this increase, cbg's continue to be 200-400.  pt says her diet and exercise are not good.  Pt reports 10 days of slight bioccipital headache, but no assoc fever. Past Medical History  Diagnosis Date  . Acute bronchitis   . Unspecified essential hypertension   . Chest pain, unspecified   . Type II or unspecified type diabetes mellitus without mention of complication, not stated as uncontrolled   . Other and unspecified hyperlipidemia   . Unspecified hypothyroidism   . Esophageal reflux   . Diverticulosis of colon (without mention of hemorrhage)   . Unspecified disorder resulting from impaired renal function   . Generalized osteoarthrosis, unspecified site   . Lumbago   . Osteoporosis   . Headache   . Anxiety state, unspecified   . Anemia of other chronic disease   . Other B-complex deficiencies     Past Surgical History  Procedure Date  . Appendectomy     History   Social History  . Marital Status: Divorced    Spouse Name: N/A    Number of Children: 6  . Years of Education: N/A   Occupational History  . Not on file.   Social History Main Topics  . Smoking status: Former Smoker -- 0.5 packs/day for 5 years    Types: Cigarettes    Quit date: 02/10/1973  . Smokeless tobacco: Former Systems developer    Types: Snuff    Quit date: 04/07/2010  . Alcohol Use: No  . Drug Use: No  . Sexually Active: Not on file   Other Topics Concern  . Not on file   Social History Narrative   1 child passed away at 69 weeks old---?crib death    Current Outpatient Prescriptions on File Prior to Visit  Medication Sig Dispense Refill  . amLODipine (NORVASC) 5 MG tablet Take 1 tablet (5 mg total) by mouth daily.  30 tablet  11  . aspirin 81 MG  tablet Take 81 mg by mouth daily.        Marland Kitchen atorvastatin (LIPITOR) 20 MG tablet Take 1 tablet (20 mg total) by mouth daily.  30 tablet  6  . Cholecalciferol (VITAMIN D3) 3000 UNITS TABS Take 2 capsules by mouth daily.       . cyanocobalamin (,VITAMIN B-12,) 1000 MCG/ML injection Inject 1 mL (1,000 mcg total) into the muscle every 30 (thirty) days.  1 mL  11  . esomeprazole (NEXIUM) 40 MG capsule Take 1 capsule (40 mg total) by mouth daily before breakfast.  30 capsule  11  . fenofibrate 160 MG tablet Take 1 tablet (160 mg total) by mouth daily.  30 tablet  11  . ferrous sulfate 325 (65 FE) MG EC tablet Take 325 mg by mouth daily with breakfast.        . glucose blood (BAYER CONTOUR TEST) test strip Use as instructed      . insulin detemir (LEVEMIR) 100 UNIT/ML injection Per Dr. Buddy Duty      . Levothyroxine Sodium 125 MCG CAPS Take 1 capsule (125 mcg total) by mouth daily.  30 capsule  11  . saxagliptin HCl (ONGLYZA) 2.5 MG TABS tablet Take 2.5 mg  by mouth daily.          Allergies  Allergen Reactions  . Codeine     REACTION: change in mental status  . Diphenhydramine Hcl     REACTION: itching  . Quinine     REACTION: abnormal sensations in face  . Sulfonamide Derivatives     REACTION: rash    Family History  Problem Relation Age of Onset  . Diabetes Mother   DM: 2 sibs  BP 124/68  Pulse 70  Temp(Src) 97.2 F (36.2 C) (Oral)  Wt 167 lb 6.4 oz (75.932 kg)  SpO2 98%   Review of Systems denies blurry vision, chest pain, sob, n/v, excessive diaphoresis, depression, and hypoglycemia.  She has weight gain, leg cramps, easy bruising, memory loss, rhinorrhea, and urinary frequency.       Objective:   Physical Exam VS: see vs page GEN: no distress HEAD: head: no deformity eyes: no periorbital swelling, no proptosis external nose and ears are normal mouth: no lesion seen Neck: a healed scar is present.  i do not appreciate a nodule in the thyroid or elsewhere in the neck CHEST  WALL: no deformity LUNGS:  Clear to auscultation CV: reg rate and rhythm.  There is a soft systolic murmur ABD: abdomen is soft, nontender.  no hepatosplenomegaly.  not distended.  no hernia.    MUSCULOSKELETAL: muscle bulk and strength are grossly normal.  no obvious joint swelling.  gait is normal and steady EXTEMITIES: no deformity.  no ulcer on the feet.  feet are of normal color and temp.  no edema.  There is bilateral onychomycosis.  There is mild eczematous rash on the legs.   PULSES: dorsalis pedis intact bilat.  no carotid bruit NEURO:  cn 2-12 grossly intact.   readily moves all 4's.  sensation is intact to touch on the feet SKIN:  Normal texture and temperature.  No rash or suspicious lesion is visible.   NODES:  None palpable at the neck PSYCH: alert, oriented x3.  Does not appear anxious nor depressed.    Lab Results  Component Value Date   HGBA1C 11.0* 03/19/2011      Assessment & Plan:  DM, needs increased rx.   Renal disease, due to DM Headache.  This could be caused by hyperglycemia Weight gain.  This complicates the rx of DM

## 2011-04-16 ENCOUNTER — Other Ambulatory Visit: Payer: Self-pay | Admitting: *Deleted

## 2011-04-16 MED ORDER — GNP LANCETS MICRO THIN 33G MISC: Status: DC

## 2011-04-21 ENCOUNTER — Encounter: Payer: Self-pay | Admitting: Endocrinology

## 2011-04-21 ENCOUNTER — Ambulatory Visit (INDEPENDENT_AMBULATORY_CARE_PROVIDER_SITE_OTHER): Payer: Medicare Other | Admitting: Endocrinology

## 2011-04-21 VITALS — BP 138/62 | HR 70 | Temp 98.0°F | Wt 170.0 lb

## 2011-04-21 DIAGNOSIS — N058 Unspecified nephritic syndrome with other morphologic changes: Secondary | ICD-10-CM

## 2011-04-21 DIAGNOSIS — E538 Deficiency of other specified B group vitamins: Secondary | ICD-10-CM

## 2011-04-21 DIAGNOSIS — E1029 Type 1 diabetes mellitus with other diabetic kidney complication: Secondary | ICD-10-CM

## 2011-04-21 MED ORDER — CYANOCOBALAMIN 1000 MCG/ML IJ SOLN
1000.0000 ug | Freq: Once | INTRAMUSCULAR | Status: AC
  Administered 2011-04-21: 1000 ug via INTRAMUSCULAR

## 2011-04-21 MED ORDER — INSULIN NPH (HUMAN) (ISOPHANE) 100 UNIT/ML ~~LOC~~ SUSP: 90.0000 [IU] | SUBCUTANEOUS | Status: DC

## 2011-04-21 NOTE — Progress Notes (Signed)
Subjective:    Patient ID: Jocelyn Mendez, female    DOB: 08/24/1930, 78 y.o.   MRN: [id number]  HPI Pt returns for f/u of insulin-requiring DM (1988).  she brings a record of her cbg's which i have reviewed today.  It varies from 104-328.  It is in general higher as the day goes on.  i have advised pt to take qac insulin, but she responds that she is willing to take insulin only once per day.   Past Medical History  Diagnosis Date  . Acute bronchitis   . Unspecified essential hypertension   . Chest pain, unspecified   . Type II or unspecified type diabetes mellitus without mention of complication, not stated as uncontrolled   . Other and unspecified hyperlipidemia   . Unspecified hypothyroidism   . Esophageal reflux   . Diverticulosis of colon (without mention of hemorrhage)   . Unspecified disorder resulting from impaired renal function   . Generalized osteoarthrosis, unspecified site   . Lumbago   . Osteoporosis   . Headache   . Anxiety state, unspecified   . Anemia of other chronic disease   . Other B-complex deficiencies     Past Surgical History  Procedure Date  . Appendectomy     History   Social History  . Marital Status: Divorced    Spouse Name: N/A    Number of Children: 6  . Years of Education: N/A   Occupational History  . Not on file.   Social History Main Topics  . Smoking status: Former Smoker -- 0.5 packs/day for 5 years    Types: Cigarettes    Quit date: 02/10/1973  . Smokeless tobacco: Former Systems developer    Types: Snuff    Quit date: 04/07/2010  . Alcohol Use: No  . Drug Use: No  . Sexually Active: Not on file   Other Topics Concern  . Not on file   Social History Narrative   1 child passed away at 73 weeks old---?crib death    Current Outpatient Prescriptions on File Prior to Visit  Medication Sig Dispense Refill  . amLODipine (NORVASC) 5 MG tablet Take 1 tablet (5 mg total) by mouth daily.  30 tablet  11  . aspirin 81 MG tablet Take 81 mg  by mouth daily.        Marland Kitchen atorvastatin (LIPITOR) 20 MG tablet Take 1 tablet (20 mg total) by mouth daily.  30 tablet  6  . Cholecalciferol (VITAMIN D3) 3000 UNITS TABS Take 2 capsules by mouth daily.       . cyanocobalamin (,VITAMIN B-12,) 1000 MCG/ML injection Inject 1 mL (1,000 mcg total) into the muscle every 30 (thirty) days.  1 mL  11  . esomeprazole (NEXIUM) 40 MG capsule Take 1 capsule (40 mg total) by mouth daily before breakfast.  30 capsule  11  . fenofibrate 160 MG tablet Take 1 tablet (160 mg total) by mouth daily.  30 tablet  11  . ferrous sulfate 325 (65 FE) MG EC tablet Take 325 mg by mouth daily with breakfast.        . glucose blood (BAYER CONTOUR TEST) test strip 1 each by Other route 2 (two) times daily. And lancets 2/dat  250.03      . GNP LANCETS MICRO THIN 33G MISC Use as directed  100 each  6  . Levothyroxine Sodium 125 MCG CAPS Take 1 capsule (125 mcg total) by mouth daily.  30 capsule  11  .  triamcinolone cream (KENALOG) 0.1 % Apply topically 3 (three) times daily. As needed for itching  30 g  1    Allergies  Allergen Reactions  . Codeine     REACTION: change in mental status  . Diphenhydramine Hcl     REACTION: itching  . Quinine     REACTION: abnormal sensations in face  . Sulfonamide Derivatives     REACTION: rash    Family History  Problem Relation Age of Onset  . Diabetes Mother     BP 138/62  Pulse 70  Temp(Src) 98 F (36.7 C) (Oral)  Wt 170 lb (77.111 kg)  SpO2 97%   Review of Systems denies hypoglycemia    Objective:   Physical Exam VITAL SIGNS:  See vs page GENERAL: no distress SKIN:  Insulin injection sites at the anterior abdomen are normal      Assessment & Plan:  DM< therapy limited by pt's need for a simple regimen.  The pattern of cbg's says she needs a shorter-acting insulin.

## 2011-04-21 NOTE — Patient Instructions (Addendum)
check your blood sugar 2 times a day.  vary the time of day when you check, between before the 3 meals, and at bedtime.  also check if you have symptoms of your blood sugar being too high or too low.  please keep a record of the readings and bring it to your next appointment here.  please call us sooner if your blood sugar goes below 70, or if it stays over 200.   Change lantus to NPH insulin, 90 units each morning.  It is ok to use up the lantus first.   Please come back for a follow-up appointment in 1 month.

## 2011-04-22 ENCOUNTER — Telehealth: Payer: Self-pay | Admitting: *Deleted

## 2011-04-22 ENCOUNTER — Other Ambulatory Visit: Payer: Self-pay | Admitting: *Deleted

## 2011-04-22 MED ORDER — INSULIN NPH (HUMAN) (ISOPHANE) 100 UNIT/ML ~~LOC~~ SUSP: 90.0000 [IU] | SUBCUTANEOUS | Status: DC

## 2011-04-22 NOTE — Telephone Encounter (Signed)
[facility] wants to know if it is okay to dispense pens instead of vials for pt's NPH insulin. Pt has only used pens in the past.

## 2011-04-22 NOTE — Telephone Encounter (Signed)
R'cd fax from [facility] that rx for NPH insulin was only sent for an 11 day supply. They need rx to be sent for 30 day supply in order for insurance to pay for it.  Rx sent to [facility].

## 2011-04-22 NOTE — Telephone Encounter (Signed)
ok 

## 2011-04-23 MED ORDER — INSULIN NPH (HUMAN) (ISOPHANE) 100 UNIT/ML ~~LOC~~ SUSP: 90.0000 [IU] | Freq: Every morning | SUBCUTANEOUS | Status: DC

## 2011-04-23 NOTE — Telephone Encounter (Signed)
Just for clarification, is it Novolin N or Humulin N?

## 2011-04-23 NOTE — Telephone Encounter (Signed)
Either insulin is of, but only humulin N comes in a pen

## 2011-04-23 NOTE — Telephone Encounter (Signed)
Rx for Humulin N pen sent to [facility].

## 2011-05-21 ENCOUNTER — Ambulatory Visit (INDEPENDENT_AMBULATORY_CARE_PROVIDER_SITE_OTHER): Payer: Medicare Other | Admitting: Endocrinology

## 2011-05-21 ENCOUNTER — Encounter: Payer: Self-pay | Admitting: Endocrinology

## 2011-05-21 VITALS — BP 130/60 | HR 81 | Temp 97.8°F | Wt 171.0 lb

## 2011-05-21 DIAGNOSIS — E538 Deficiency of other specified B group vitamins: Secondary | ICD-10-CM

## 2011-05-21 DIAGNOSIS — E1065 Type 1 diabetes mellitus with hyperglycemia: Secondary | ICD-10-CM

## 2011-05-21 DIAGNOSIS — N058 Unspecified nephritic syndrome with other morphologic changes: Secondary | ICD-10-CM

## 2011-05-21 DIAGNOSIS — E1029 Type 1 diabetes mellitus with other diabetic kidney complication: Secondary | ICD-10-CM

## 2011-05-21 MED ORDER — CYANOCOBALAMIN 1000 MCG/ML IJ SOLN
1000.0000 ug | Freq: Once | INTRAMUSCULAR | Status: AC
  Administered 2011-05-21: 1000 ug via INTRAMUSCULAR

## 2011-05-21 NOTE — Patient Instructions (Addendum)
check your blood sugar 2 times a day.  vary the time of day when you check, between before the 3 meals, and at bedtime.  also check if you have symptoms of your blood sugar being too high or too low.  please keep a record of the readings and bring it to your next appointment here.  please call us sooner if your blood sugar goes below 70, or if it stays over 200.   Continue NPH insulin, 90 units each morning.   Please come back for a follow-up appointment in 2 months.

## 2011-05-21 NOTE — Progress Notes (Signed)
Subjective:    Patient ID: Jocelyn Mendez, female    DOB: 09/02/1930, 78 y.o.   MRN: [id number]  HPI Pt returns for f/u of insulin-requiring DM (1988).  she brings a record of her cbg's which i have reviewed today.  It varies from 74-220, but most are in the low-100's.  There is no trend throughout the day.  i have advised pt to take qac insulin, but she responds that she is willing to take insulin only once per day.  She is now on qam-NPH. Past Medical History  Diagnosis Date  . Acute bronchitis   . Unspecified essential hypertension   . Chest pain, unspecified   . Type II or unspecified type diabetes mellitus without mention of complication, not stated as uncontrolled   . Other and unspecified hyperlipidemia   . Unspecified hypothyroidism   . Esophageal reflux   . Diverticulosis of colon (without mention of hemorrhage)   . Unspecified disorder resulting from impaired renal function   . Generalized osteoarthrosis, unspecified site   . Lumbago   . Osteoporosis   . Headache   . Anxiety state, unspecified   . Anemia of other chronic disease   . Other B-complex deficiencies     Past Surgical History  Procedure Date  . Appendectomy     History   Social History  . Marital Status: Divorced    Spouse Name: N/A    Number of Children: 6  . Years of Education: N/A   Occupational History  . Not on file.   Social History Main Topics  . Smoking status: Former Smoker -- 0.5 packs/day for 5 years    Types: Cigarettes    Quit date: 02/10/1973  . Smokeless tobacco: Former Systems developer    Types: Snuff    Quit date: 04/07/2010  . Alcohol Use: No  . Drug Use: No  . Sexually Active: Not on file   Other Topics Concern  . Not on file   Social History Narrative   1 child passed away at 27 weeks old---?crib death    Current Outpatient Prescriptions on File Prior to Visit  Medication Sig Dispense Refill  . amLODipine (NORVASC) 5 MG tablet Take 1 tablet (5 mg total) by mouth daily.  30  tablet  11  . aspirin 81 MG tablet Take 81 mg by mouth daily.        Marland Kitchen atorvastatin (LIPITOR) 20 MG tablet Take 1 tablet (20 mg total) by mouth daily.  30 tablet  6  . Cholecalciferol (VITAMIN D3) 3000 UNITS TABS Take 2 capsules by mouth daily.       . cyanocobalamin (,VITAMIN B-12,) 1000 MCG/ML injection Inject 1 mL (1,000 mcg total) into the muscle every 30 (thirty) days.  1 mL  11  . esomeprazole (NEXIUM) 40 MG capsule Take 1 capsule (40 mg total) by mouth daily before breakfast.  30 capsule  11  . fenofibrate 160 MG tablet Take 1 tablet (160 mg total) by mouth daily.  30 tablet  11  . ferrous sulfate 325 (65 FE) MG EC tablet Take 325 mg by mouth daily with breakfast.        . glucose blood (BAYER CONTOUR TEST) test strip 1 each by Other route 2 (two) times daily. And lancets 2/dat  250.03      . GNP LANCETS MICRO THIN 33G MISC Use as directed  100 each  6  . insulin NPH (HUMULIN N PEN) 100 UNIT/ML injection Inject 90 Units into the skin  every morning.  30 mL  11  . Levothyroxine Sodium 125 MCG CAPS Take 1 capsule (125 mcg total) by mouth daily.  30 capsule  11  . triamcinolone cream (KENALOG) 0.1 % Apply topically 3 (three) times daily. As needed for itching  30 g  1  . DISCONTD: insulin glargine (LANTUS SOLOSTAR) 100 UNIT/ML injection Inject 100 Units into the skin every morning.  45 mL  PRN   No current facility-administered medications on file prior to visit.    Allergies  Allergen Reactions  . Codeine     REACTION: change in mental status  . Diphenhydramine Hcl     REACTION: itching  . Quinine     REACTION: abnormal sensations in face  . Sulfonamide Derivatives     REACTION: rash    Family History  Problem Relation Age of Onset  . Diabetes Mother     BP 130/60  Pulse 81  Temp(Src) 97.8 F (36.6 C) (Oral)  Wt 171 lb (77.565 kg)  SpO2 97%   Review of Systems denies hypoglycemia    Objective:   Physical Exam VITAL SIGNS:  See vs page GENERAL: no distress PSYCH:  Alert and oriented x 3.  Does not appear anxious nor depressed.      Assessment & Plan:  DM, with improved control.  therapy limited by pt's need for a simple regimen

## 2011-07-04 ENCOUNTER — Telehealth: Payer: Self-pay | Admitting: Pulmonary Disease

## 2011-07-04 MED ORDER — AMLODIPINE BESYLATE 5 MG PO TABS: 5.0000 mg | ORAL_TABLET | Freq: Every day | ORAL | Status: DC

## 2011-07-04 NOTE — Telephone Encounter (Signed)
I spoke with Estill Bamberg and is aware rx was sent

## 2011-07-14 ENCOUNTER — Telehealth: Payer: Self-pay | Admitting: Pulmonary Disease

## 2011-07-14 NOTE — Telephone Encounter (Signed)
Called and spoke with pt and she stated that she has lots of swelling in her feet and legs--up to her knees.  She has been taking furosemide 20mg  daily given by Dr. Ronald Lobo.  Has been elevating her feet and this does help.  Denies any SOB.  Pt requesting recs from SN.  Please advise. Thanks  Allergies  Allergen Reactions  . Codeine     REACTION: change in mental status  . Diphenhydramine Hcl     REACTION: itching  . Quinine     REACTION: abnormal sensations in face  . Sulfonamide Derivatives     REACTION: rash

## 2011-07-14 NOTE — Telephone Encounter (Signed)
Per SN--needs a low sodium diet, elevate legs and wear the support hose.  Pt is aware to contact Dr. Ronald Lobo tomorrow to see about increasing her lasix.  Pt voiced her understanding of this and nothing further was needed.

## 2011-07-21 ENCOUNTER — Telehealth: Payer: Self-pay | Admitting: Pulmonary Disease

## 2011-07-21 DIAGNOSIS — Z1231 Encounter for screening mammogram for malignant neoplasm of breast: Secondary | ICD-10-CM

## 2011-07-21 NOTE — Telephone Encounter (Signed)
Per leigh okay to send referral. i have placed this and daughter aware. Nothing further was needed

## 2011-07-22 ENCOUNTER — Other Ambulatory Visit: Payer: Self-pay | Admitting: Pulmonary Disease

## 2011-07-22 DIAGNOSIS — N63 Unspecified lump in unspecified breast: Secondary | ICD-10-CM

## 2011-07-23 ENCOUNTER — Encounter: Payer: Self-pay | Admitting: Endocrinology

## 2011-07-23 ENCOUNTER — Ambulatory Visit (INDEPENDENT_AMBULATORY_CARE_PROVIDER_SITE_OTHER): Payer: Medicare Other | Admitting: Endocrinology

## 2011-07-23 ENCOUNTER — Other Ambulatory Visit (INDEPENDENT_AMBULATORY_CARE_PROVIDER_SITE_OTHER): Payer: Medicare Other

## 2011-07-23 VITALS — BP 112/68 | HR 68 | Temp 97.6°F | Wt 171.0 lb

## 2011-07-23 DIAGNOSIS — E1029 Type 1 diabetes mellitus with other diabetic kidney complication: Secondary | ICD-10-CM

## 2011-07-23 DIAGNOSIS — E1065 Type 1 diabetes mellitus with hyperglycemia: Secondary | ICD-10-CM

## 2011-07-23 DIAGNOSIS — N058 Unspecified nephritic syndrome with other morphologic changes: Secondary | ICD-10-CM

## 2011-07-23 LAB — HEMOGLOBIN A1C: Hgb A1c MFr Bld: 8.9 % — ABNORMAL HIGH (ref 4.6–6.5)

## 2011-07-23 NOTE — Progress Notes (Signed)
Subjective:    Patient ID: Jocelyn Mendez, female    DOB: 26-Mar-1930, 78 y.o.   MRN: [id number]  HPI Pt returns for f/u of insulin-requiring DM (dx'ed XX123456; complicated by renal insuff).  She has chosen qd insulin. she brings a record of her cbg's which i have reviewed today.  It varies from 87-170.  It is in general higher as the day goes on Past Medical History  Diagnosis Date  . Acute bronchitis   . Unspecified essential hypertension   . Chest pain, unspecified   . Type II or unspecified type diabetes mellitus without mention of complication, not stated as uncontrolled   . Other and unspecified hyperlipidemia   . Unspecified hypothyroidism   . Esophageal reflux   . Diverticulosis of colon (without mention of hemorrhage)   . Unspecified disorder resulting from impaired renal function   . Generalized osteoarthrosis, unspecified site   . Lumbago   . Osteoporosis   . Headache   . Anxiety state, unspecified   . Anemia of other chronic disease   . Other B-complex deficiencies     Past Surgical History  Procedure Date  . Appendectomy     History   Social History  . Marital Status: Divorced    Spouse Name: N/A    Number of Children: 6  . Years of Education: N/A   Occupational History  . Not on file.   Social History Main Topics  . Smoking status: Former Smoker -- 0.5 packs/day for 5 years    Types: Cigarettes    Quit date: 02/10/1973  . Smokeless tobacco: Former Systems developer    Types: Snuff    Quit date: 04/07/2010  . Alcohol Use: No  . Drug Use: No  . Sexually Active: Not on file   Other Topics Concern  . Not on file   Social History Narrative   1 child passed away at 58 weeks old---?crib death    Current Outpatient Prescriptions on File Prior to Visit  Medication Sig Dispense Refill  . amLODipine (NORVASC) 5 MG tablet Take 1 tablet (5 mg total) by mouth daily.  30 tablet  6  . aspirin 81 MG tablet Take 81 mg by mouth daily.        Marland Kitchen atorvastatin (LIPITOR) 20 MG  tablet Take 1 tablet (20 mg total) by mouth daily.  30 tablet  6  . Cholecalciferol (VITAMIN D3) 3000 UNITS TABS Take 2 capsules by mouth daily.       Marland Kitchen esomeprazole (NEXIUM) 40 MG capsule Take 1 capsule (40 mg total) by mouth daily before breakfast.  30 capsule  11  . fenofibrate 160 MG tablet Take 1 tablet (160 mg total) by mouth daily.  30 tablet  11  . ferrous sulfate 325 (65 FE) MG EC tablet Take 325 mg by mouth daily with breakfast.        . furosemide (LASIX) 20 MG tablet Take 20 mg by mouth daily.      Marland Kitchen glucose blood (BAYER CONTOUR TEST) test strip 1 each by Other route 2 (two) times daily. And lancets 2/dat  250.03      . GNP LANCETS MICRO THIN 33G MISC Use as directed  100 each  6  . Levothyroxine Sodium 125 MCG CAPS Take 1 capsule (125 mcg total) by mouth daily.  30 capsule  11  . triamcinolone cream (KENALOG) 0.1 % Apply topically 3 (three) times daily. As needed for itching  30 g  1  . insulin NPH (  HUMULIN N PEN) 100 UNIT/ML injection Inject 100 Units into the skin every morning.  30 mL  11  . DISCONTD: insulin glargine (LANTUS SOLOSTAR) 100 UNIT/ML injection Inject 100 Units into the skin every morning.  45 mL  PRN    Allergies  Allergen Reactions  . Codeine     REACTION: change in mental status  . Diphenhydramine Hcl     REACTION: itching  . Quinine     REACTION: abnormal sensations in face  . Sulfonamide Derivatives     REACTION: rash    Family History  Problem Relation Age of Onset  . Diabetes Mother     BP 112/68  Pulse 68  Temp 97.6 F (36.4 C) (Oral)  Wt 171 lb (77.565 kg)  SpO2 97%  Review of Systems denies hypoglycemia    Objective:   Physical Exam VITAL SIGNS:  See vs page GENERAL: no distress Pulses: dorsalis pedis intact bilat.   Feet: no deformity.  no ulcer on the feet.  feet are of normal color and temp.  Trace bilat leg edema.  There is bilateral onychomycosis.   Neuro: sensation is intact to touch on the feet.  Lab Results  Component  Value Date   HGBA1C 8.9* 07/23/2011       Assessment & Plan:  DM.  needs increased rx

## 2011-07-23 NOTE — Patient Instructions (Addendum)
blood tests are being requested for you today.  You will receive a letter with results. check your blood sugar 2 times a day.  vary the time of day when you check, between before the 3 meals, and at bedtime.  also check if you have symptoms of your blood sugar being too high or too low.  please keep a record of the readings and bring it to your next appointment here.  please call us sooner if your blood sugar goes below 70, or if it stays over 200.   Continue NPH insulin, 90 units each morning.   Please come back for a follow-up appointment in 3 months.

## 2011-07-24 ENCOUNTER — Telehealth: Payer: Self-pay | Admitting: *Deleted

## 2011-07-24 NOTE — Telephone Encounter (Signed)
Called pt to inform of lab results, pt's daughter inform of results. (Letter also mailed to pt)

## 2011-08-04 ENCOUNTER — Telehealth: Payer: Self-pay | Admitting: Pulmonary Disease

## 2011-08-04 NOTE — Telephone Encounter (Signed)
Per SN----pt will need ov with cxr and labs one day this week.  Can add pt on SN schedule for 6/25 at 2:30.  thanks

## 2011-08-04 NOTE — Telephone Encounter (Signed)
Pt returned call.  Jocelyn Mendez ° °

## 2011-08-04 NOTE — Telephone Encounter (Signed)
I spoke with Estill Bamberg and is aware of SN recs. Pt added on schedule. Nothing further was needed

## 2011-08-04 NOTE — Telephone Encounter (Signed)
I spoke with daughter--Pt has deep productive cough w/ green phlem, low grade fever, c/s, headaches, and chest hurting from all the cough x 3-4 days. She has been taking robitussin and tylenol. Please advise SN thanks  Allergies  Allergen Reactions  . Codeine     REACTION: change in mental status  . Diphenhydramine Hcl     REACTION: itching  . Quinine     REACTION: abnormal sensations in face  . Sulfonamide Derivatives     REACTION: rash     [facility]

## 2011-08-04 NOTE — Telephone Encounter (Signed)
lmomtcb x1 for pt 

## 2011-08-05 ENCOUNTER — Encounter: Payer: Self-pay | Admitting: Pulmonary Disease

## 2011-08-05 ENCOUNTER — Ambulatory Visit (INDEPENDENT_AMBULATORY_CARE_PROVIDER_SITE_OTHER)
Admission: RE | Admit: 2011-08-05 | Discharge: 2011-08-05 | Disposition: A | Discharge status: Home / Self Care | Payer: Medicare Other | Source: Ambulatory Visit | Attending: Pulmonary Disease | Admitting: Pulmonary Disease

## 2011-08-05 ENCOUNTER — Ambulatory Visit (INDEPENDENT_AMBULATORY_CARE_PROVIDER_SITE_OTHER): Payer: Medicare Other | Admitting: Pulmonary Disease

## 2011-08-05 VITALS — BP 120/60 | HR 92 | Temp 97.0°F | Ht 66.0 in | Wt 172.0 lb

## 2011-08-05 DIAGNOSIS — M159 Polyosteoarthritis, unspecified: Secondary | ICD-10-CM

## 2011-08-05 DIAGNOSIS — D638 Anemia in other chronic diseases classified elsewhere: Secondary | ICD-10-CM

## 2011-08-05 DIAGNOSIS — J209 Acute bronchitis, unspecified: Secondary | ICD-10-CM

## 2011-08-05 DIAGNOSIS — I10 Essential (primary) hypertension: Secondary | ICD-10-CM

## 2011-08-05 DIAGNOSIS — N058 Unspecified nephritic syndrome with other morphologic changes: Secondary | ICD-10-CM

## 2011-08-05 DIAGNOSIS — E039 Hypothyroidism, unspecified: Secondary | ICD-10-CM

## 2011-08-05 DIAGNOSIS — N259 Disorder resulting from impaired renal tubular function, unspecified: Secondary | ICD-10-CM

## 2011-08-05 DIAGNOSIS — R05 Cough: Secondary | ICD-10-CM

## 2011-08-05 DIAGNOSIS — J069 Acute upper respiratory infection, unspecified: Secondary | ICD-10-CM

## 2011-08-05 DIAGNOSIS — M545 Low back pain: Secondary | ICD-10-CM

## 2011-08-05 DIAGNOSIS — E785 Hyperlipidemia, unspecified: Secondary | ICD-10-CM

## 2011-08-05 DIAGNOSIS — E1065 Type 1 diabetes mellitus with hyperglycemia: Secondary | ICD-10-CM

## 2011-08-05 DIAGNOSIS — E1029 Type 1 diabetes mellitus with other diabetic kidney complication: Secondary | ICD-10-CM

## 2011-08-05 MED ORDER — LEVOFLOXACIN 500 MG PO TABS: 500.0000 mg | ORAL_TABLET | Freq: Every day | ORAL | Status: DC

## 2011-08-05 MED ORDER — HYDROCOD POLST-CHLORPHEN POLST 10-8 MG/5ML PO LQCR: 5.0000 mL | Freq: Two times a day (BID) | ORAL | Status: DC

## 2011-08-05 MED ORDER — FUROSEMIDE 20 MG PO TABS: 20.0000 mg | ORAL_TABLET | Freq: Every day | ORAL | Status: DC

## 2011-08-05 MED ORDER — RANITIDINE HCL 150 MG PO CAPS: 150.0000 mg | ORAL_CAPSULE | Freq: Every evening | ORAL | Status: DC

## 2011-08-05 NOTE — Progress Notes (Signed)
Subjective:    Patient ID: Jocelyn Mendez, female    DOB: 10-30-1930, 78 y.o.   MRN: [id number]  HPI 78 y/o WF here for a follow up visit... she has mult med problems including:  HBP, Hypercholesterolemia, DM, mild renal insuffic, & multifactorial anemia w/ B12 defic on shots monthly... she also has DJD, LBP, Osteopenia, etc...  ~  May 13, 2010:  6 month ROV & she notes incr anxiety & she wants to incr her Alprazolam to 0.25mg  Tid;  Also notes "staggering" & "chest pains"> she wants to see Neurology for eval of the former, and Cardiology to check the latter (see below)...    Cardiac>  BP controlled on Diovan/HCT & BP= 126/78 today w/o postural changes;  She is c/o dull squeezing CP that goes to her left arm on occas but is not really activity related, & quite variable;  She denies HA, visual symptoms, ch in SOB, edema, etc;  EKG shows NSR, borderline 1st degree AVB, poor R prog V1-4, NAD (unchanged from old tracings);  She has hx atypical CP in past (mostly sharp CWP treated w/ Tylenol) & had cath 1988 w/ norm coronaries & mild MVP seen;  Pt daughter wants a Cards consult...    Hyperlipid>  On Lip20 + Fenofib160, plus her low fat diet;  FLP is no better today w/ TChol 160, TG 258, HDL 19, LDL 100;  Needs better low fat diet, offered ch to Cres20 but cost a factor, didn't do any better on Lopid in past & reluctant to take Niacin due to flushing...    DM>  On Metformin500Bid & Glimep4mg /d and her home BS checks are elev in the 150-200 range she says;  Prev A1c's = 7-9 over the last yr;  BS today 290 & A1c=10.3;  We discussed need for much better diet, & we will need to incr her Metform to 1000mg  Bid & add Onglyza 5mg /d; she wants to avoid "the needle"...    Hypothy>  On Synthroid 159mcg/d & TSH is stable = 1.02 today, continue same dose...    Renal Insuffic>  Renal function is stable w/ BUN= 28, Creat= 1.4 today...    DJD & Gait Abn>  She is also c/o "staggering" all the time she says, by this she is  referring to a multifactorial gait abn (not dizzy, pre-syncopal, etc);  She has DJD, cerv & lumbar spondylosis, osteopenia, vit d defic, etc;  Daughter wants Neuro eval for her staggering prob> she had CHI 9/10, seen [facility] w/ CT showing atrophy & tiny SAH that resolved quickly...    Anemia>  She has pernicious anemia on B12 shots monthly & a multifactorial anemia for which she takes Fe, Vit C, MVI;  Hg=10.3, MCV=85, continue same Rx...    Anxiety>  On Alprazolam 0,25mg  but only taking 1/2 tab prn;  rec to incr dose to 1 tab tid...  ~  May 22, 2010:  Family call for an urgent add-on appt due to apparent gastroenteritis w/ N V D> nausea x 3-4d w/ fair intake, vomit x1 but keeping fluids down, & diarrhea x 4-5 times daily for several days but stopped last PM & no BM today;  She denies F C S, states appetite is good, just feeling weak & BS are up & down;  otherw situation is as above, they needed a lot of reassurance & support, it does not appear as though the Metformin was causing the diarrhea as it has now stopped w/o ch in  meds.  ~  Jul 04, 2010:  s/p Hosp by [facility] in April & May for dehydration, renal insuffic, etc; then UTI (sens EColi), weakness, DM, ?type 4 RTA> see DCSummary...  Told she has kidney disease & they have appt w/ Nephrology pending- set up by the hospitalists; they also want appt w/ diabetic specialist- we will arrange this per their request...  ~  August 12, 2010:  66mo ROV & review> She will now be receiving her care by committee:  Seen by Nephrology recently (note pending) & she tells me that "they stopped the Metformin & raised the little blue pill to 1&1/2 daily";  She has appt w/ DrKerr for DM/Endocrine July 24 & her home BS checks have all been in the 200's (currently on [place]);  BP remains controlled on meds as below;  Lipids fair on Lip20 & Fenofib160 but needs better low fat diet & incr exerc;  Thyroid stable on 129mcg/d;  See prob list>>  ~  November 12, 2010:  55mo ROV> she  requested add-on appt for "cold" w/ cough, yellow sput, chest congestion; ?low grade fever w/o chills or sweats; Augmentin called in for pt & she is improved, also rec to take Mucinex 2Bid + Fluids po...  BP controlled on Amlodipine;  DM under treatment from [place] on [place] but A1c still out of control;  Lipids regulated by [place]- needs ret for FLP;  Thyroid stable on Synthroid112;  Other problems as noted below...  ~  March 17, 2011:  56mo ROV & she notes doing better, no new complaints or concerns; followed by DrKerr every 19mo & DrGoldsborough every 6 months>    HBP> on Norvasc5; BP= 130/70 & stable; denies CP, palpit, ch in dyspnea or edema...    DM> on LEVEMIR (now 46uBid) & Onglyza 2.5mg /d; followed by DrKerr & his note of 10/12 is reviewed- A1c= 9.0 then while on 80u Levemir/d... LABS TODAY show FBS=229, A1c=11.0, and copies sent to Endocrine.    Hyperlipid> on Lip20 + Fenofib160; needs to ret fasting for f/u FLP ==> TChol 147, TG 165, HDL 30, LDL 85; rec better low fat diet & incr exercise...    Hypothyroid> on Synthroid 141mcg/d; TSH had been controlled on this dose but f/u TSH= 20.8 & pharm confirms regular refills! This really doesn't make much sense but we will incr Synthroid to 157mcg/d dose...     GI> GERD, Divertics> on Nexium prn + Miralax & Senakot-S...    Renal Insuffic> followed by Nephrology [place] & her note of 10/12 is reviewed & Creat was 1.8.Marland KitchenMarland Kitchen    DJD, LBP, Osteopenia> she knows to avoid NSAIDs & uses Tylenol as needed + Vit D supplement...    Anxiety> she is not currently on an anxiolytic agent even though I have rec low dose Alpraz in the past...    Anemia & B12 Defic> on Fe orally & B12 shots monthly (she notes her Pharm was out of parenteral B12 recently); Hg stable in the 10-12 range...  ~  August 05, 2011:  4-58mo ROV and [place] called for an add=on visit due to an upper resp infection w/ cough, yellow-green sputum, low grade fever & aching all  over; taking Robitssin & Tylenol but no better; CXR today shows some peribronchial thickening but no infiltrate & exam shows scat rhonchi but no consolidation; we decided to treat w/ Levaquin, Mucinex, Fluids,  & Tussionex for her cough w/ CP...    She has had 4  f/u visits w/ DrEllison since I last saw her> her A1c is improved down to 8.9 recently; she is on HumulinN 100uQd; pt & daugh like DrEllison...    She wants to stop the Nexium (too expensive) & switch to Zantac; I suggested Prilosec ($20 for 90 pills at [facility]) but they want the Zantac...    She saw Nephrology, Guinevere Scarlet 4/13> Stage III CKD, Creat in the 1.5-2.0 range, GFR in the 30s, BP & vol status were good=> given LASIX 20mg  to use prn swelling.    We reviewed prob list, meds, xrays and labs> see below>> CXR 6/13 showed norm heart size, min streaky atx at basesw/ sl peribronchial thickening c/w bronchitis, otherw clear, NAD...          Problem List:  Hx of ASTHMATIC BRONCHITIS, ACUTE (ICD-466.0) - no recent bronchitic episodes... the increased activity has really helped her... ~  CXR 5/12 showed clear lungs x mild scarring left base & apical pleural thickening... ~  CXR 6/13 showed norm heart size, min streaky atx at basesw/ sl peribronchial thickening c/w bronchitis, otherw clear, NAD...  HYPERTENSION (ICD-401.9) - prev on Diovan/HCT but this was stopped 4/12 Hosp & now on NORVASC 5mg /d... ~  10/12:  BP= 128/62 & she denies CP, palpit, syncope, SOB, edema... ~  2/13:  BP= 130/70 & she feels well & denies symptoms... ~  4/13:  DrGoldsborough started LASIX 20mg /d as needed for swelling... ~  6/13:  BP= 120/60 & she notes some CP from her cough, but no palpit/ ch i SOB, etc...  Hx of CHEST PAIN (ICD-786.50) - on ASA 81mg /d;  she continues to experience intermittent sharp left CWP- Rx w/ Tylenol Prn. ~  cath 1988 showed normal coronaries, min MVP... ~  Cards eval 4/12 by DrNishan> mult risk factors, atypCP, EKG w/ NSSTTWA,  planned Myoview but not done due to subseq hosp 4/12...  DM (ICD-250.00) - followed by DrEllison> DM w/ Nephropathy & Neuropathy> on HUMULIN N 100u daily; (off Metformin rx since 4/12 hosp)... ~  labs 8/08 showed BS= 113, HgA1c=5.4.Marland KitchenMarland Kitchen On Metform + Glimep. ~  labs 4/09 showed BS= 99, HgA1c= 5.5.Marland KitchenMarland Kitchen Stopped Glimep. ~  labs 4/10 showed BS= 125, A1c= 7.1.Marland Kitchen. rec> better diet, same meds for now. ~  labs 4/11 showed BS= 266, A1c= 9.1.Marland KitchenMarland Kitchen what happened? Add Glimep1mg /d back. ~  labs 6/11 showed BS= 195, A1c= 7.9.Marland KitchenMarland Kitchen incr Glimep to 2mg /d. ~  labs 10/11 showed BS= 175, A1c= 7.9.Marland KitchenMarland Kitchen rec incr Gimep to 4mg /d. ~  Labs in [facility] 5/12 showed BS= 232==>139; A1c=9.7; Metform stopped in hosp, on [place]. ~  Labs 5/12 post hosp showed BS= 255, A1c= 8.6.Marland KitchenMarland Kitchen Referred to Endocrinology. ~  7/12:  BS at home all in the 200s, she doesn't want additional meds, wants to wait for appt w/ DrKerr 7/24. ~  8/12:  She was seen by DrKerr & her started her on Levemir insulin + Onglyza 2.5mg /d ==> his notes & labs are reviewed... ~  10/12: Note from DrKerr> pt on Levemir 80u + Onglyza 2.5mg /d w/ A1c= 9.0.Marland Kitchen. ~  Labs here 2/13 showed FBS= 229, A1c= 11.0 & we will fax to DrKerr for his review... ~  Pt switched to DrEllison in the interval> meds changed to HUMULIN N & now on 100u daily w/ recent A1c=8.9.Marland KitchenMarland Kitchen  HYPERLIPIDEMIA (ICD-272.4) - on LIPITOR 20mg /d & FENOFIBRATE 160mg /d (ch from Lopid 10/10). ~  FLP 8/08 showed TChol 129, TG 197, HDL 17, LDL 73... despite taking statin & fibrate drugs. ~  FLP 4/09 on Lip20+Tric145 showed TChol 127, TG 269, HDL 13, LDL 69... rec> ch Tricor to [phone]. ~  [place] 10/09 on Lip20+Trilip135 showed TChol 124, TG 247, HDL 10!, LDL 64... rec> ch Trilip to LopidBid. ~  FLP 4/10 on Lip20+LopidBid showed TChol 174, TG 239, HDL 38, LDL 103... rec> ch Lopid to Feno160 (she never did). ~  FLP 10/10 showed TChol 134, TG 256, HDL 10, LDL 76... change the Lopid to Fenofib160/d. ~  FLP 4/11 on Lip20+Feno160  showed TChol 163, TG 234, HDL 28, LDL 108... keep same, better diet! ~  FLP 10/11 on Lip20+Feno160 showed TChol 172, Tg 304, HDL 26, LDL 112... take meds regularly & better diet. ~  FLP 4/12 on Lip20+Feno160 showed TChol 160, TG 258, HDL 19, LDL 100... Needs better low fat diet. ~  FLP 2/13 on Lip20+Feno160 showed TChol 147, TG 165, HDL 30, LDL 85  HYPOTHYROIDISM (ICD-244.9) - Hx remote Thyroid surg & scar in neck> on SYNTHROID 169mcg/d... ~  last TSH 1/08 = 0.41 ~  labs 4/09 showed TSH= 2.35 ~  labs 10/09 on Levothy125 showed TSH= 0.97 ~  labs 4/10 on Levothy125 showed TSH= 0.08... rec> decr [phone]. ~  labs 10/10 on Levothy112 showed TSH= 2.18... keep same. ~  labs 4/11 on Levo112 showed TSH= 0.51 ~  Labs 4/12 on Levo112 showed TSH= 1.02 ~  Labs 2/13 on Levo112 showed TSH= 20.8.Marland KitchenMarland Kitchen Why? Pharm confirms regular refills, we will incr dose to 14mcg/d.  GERD (ICD-530.81) - takes NEXIUM 40mg  Prn (states Omep20 "makes me sick")...  ~  EGD was planned in 2006 eval but never done... ~  6/13:  She want to switch to ZANTAC 150mg /d since Nexium is too expensive  DIVERTICULOSIS OF COLON (ICD-562.10) - c/o constip & rec> Miralax + Senakot-S... ~  last colonoscopy 3/06 by DrDBrodie showed divertics only...  RENAL INSUFFICIENCY (ICD-588.9) - prev Creat in the 1.7 - 2.0 range... ~  labs 8/08 showed BUN=26, Creat=1.5 ~  labs 4/09 showed BUN= 34, Creat= 1.5 ~  labs 10/09 showed BUN= 31, Creat= 1.5 ~  labs 4/10 showed BUN= 28, Creat= 1.0, Microalb= pos... continue Rx (on Diovan). ~  labs 10/10 showed BUN= 22, Creat= 1.2 ~  labs 4/11 showed BUN= 20, Creat= 1.5 ~  labs 6/11 showed BUN= 20, Creat= 1.3 ~  labs 10/11 showed BUN= 27, Creat= 1.5 ~  Renal Ultrasound 5/12 was neg- NAD, no hydronephrosis... ~  Labs in [facility] 5/12 showed Creat= 2.11==>1.4 ~  Labs 5/12 post hosp showed BUN=44, Creat=1.7, K=5.7, HCO3=23;  She has appt w/ Nephrology set up by the Hospitalists. ~  6/12 & 10/12:  Pt seen by  DrGoldsborough & her notes are reviewed... ~  Labs 2/13 showed BUN= 33, Creat= 1.6  DEGENERATIVE JOINT DISEASE, GENERALIZED (ICD-715.00) - she uses Tylenol Prn. LOW BACK PAIN (ICD-724.2) OSTEOPOROSIS (ICD-733.00) - she takes calcium + vits daily... ~  BMD here 2/06 showed TScores -1.4 Spine,  -1.1 left FemNeck... ~  labs 4/09 showed Vit D level= 24... rec OTC Vit D 1000 u daily, but she stopped this. ~  labs 4/10 showed Vit D level = 20... rec> incr OTC Vit D 2000 u daily (she never did). ~  labs 4/11 showed Vit D level = 22...Marland KitchenMarland KitchenMarland Kitchen rec> incr to 2000 u daily. ~  Labs 4/12 showed Vit D level = 32  HEADACHE (ICD-784.0)  ANXIETY (ICD-300.00) - she notes her Bro has emphysema, lung cancer and CHF... she takes ALPRAZOLAM 0.5mg  Prn.  ANEMIA>  Hx ACD & IronDefic>  Rx FeSO4 + VitC ~  Hx of multifactorial anemia w/ Hg=11 in 2005, Fe was OK, Stools neg for blood, Folate norm, B12 found low, & Creat 1.5+ ~  labs 1/08 showed Hg=11.1 ~  labs 8/08 showed Hg=9.9 ~  labs 4/09 showed Hg= 9.6, Fe=102, TIBC=344, Sat=21% ~  labs 10/09 showed Hg= 9.1, MCV= 92 ~  labs 4/10 showed = 11.1, Fe= 71 ~  labs 10/10 showed Hg= 9.3, MCV= 93... rec Fe Bid w/ Vit C... ~  labs 4/11 showed Hg= 11.8, MCV=87, Fe=86 ~  labs 10/11 showed Hg= 10.9, MCV= 89 ~  Labs in [facility] 5/12 showed Hg= 10.3==>8.6, Fe=19 (5%sat), B12=505, Folate=16, SPE= neg. ~  Labs 2/13 showed Hg= 12.0  VITAMIN B12 DEFICIENCY (ICD-266.2) - Vit B12 level was 77 in 2006 w/ pos parietal cell antibodies; she's been on B12 shots ever since then...  ~  f/u B12 level 4/10 = >1500 ~  f/u B12 level 5/12= 505 ~  She remains on monthly Vit B12 shots...   Past Surgical History  Procedure Date  . Appendectomy     Outpatient Encounter Prescriptions as of 08/05/2011  Medication Sig Dispense Refill  . amLODipine (NORVASC) 5 MG tablet Take 1 tablet (5 mg total) by mouth daily.  30 tablet  6  . aspirin 81 MG tablet Take 81 mg by mouth daily.        Marland Kitchen atorvastatin  (LIPITOR) 20 MG tablet Take 1 tablet (20 mg total) by mouth daily.  30 tablet  6  . Cholecalciferol (VITAMIN D3) 3000 UNITS TABS Take 2 capsules by mouth daily.       Marland Kitchen esomeprazole (NEXIUM) 40 MG capsule Take 1 capsule (40 mg total) by mouth daily before breakfast.  30 capsule  11  . fenofibrate 160 MG tablet Take 1 tablet (160 mg total) by mouth daily.  30 tablet  11  . ferrous sulfate 325 (65 FE) MG EC tablet Take 325 mg by mouth daily with breakfast.        . furosemide (LASIX) 20 MG tablet Take 20 mg by mouth daily.      Marland Kitchen glucose blood (BAYER CONTOUR TEST) test strip 1 each by Other route 2 (two) times daily. And lancets 2/dat  250.03      . GNP LANCETS MICRO THIN 33G MISC Use as directed  100 each  6  . insulin NPH (HUMULIN N PEN) 100 UNIT/ML injection Inject 100 Units into the skin every morning.  30 mL  11  . Levothyroxine Sodium 125 MCG CAPS Take 1 capsule (125 mcg total) by mouth daily.  30 capsule  11  . triamcinolone cream (KENALOG) 0.1 % Apply topically 3 (three) times daily. As needed for itching  30 g  1    Allergies  Allergen Reactions  . Codeine     REACTION: change in mental status  . Diphenhydramine Hcl     REACTION: itching  . Quinine     REACTION: abnormal sensations in face  . Sulfonamide Derivatives     REACTION: rash    Current Medications, Allergies, Past Medical History, Past Surgical History, Family History, and Social History were reviewed in [employer] record.    Review of Systems        See HPI - all other systems neg except as noted... The patient complains of nausea & diarrhea- now resolved.  The patient denies anorexia, fever, weight loss, weight gain, vision  loss, decreased hearing, hoarseness, syncope, peripheral edema, prolonged cough, headaches, hemoptysis, abdominal pain, melena, hematochezia, severe indigestion/heartburn, hematuria, incontinence, suspicious skin lesions, transient blindness, depression, unusual weight  change, abnormal bleeding, enlarged lymph nodes, and angioedema.     Objective:   Physical Exam      WD, WN, 78 y/o WF in NAD...  VS reviewed & BP= 120/64 w/o postural changes... GENERAL:  Alert & oriented; pleasant & cooperative... HEENT:  [place]/AT, EOM-wnl, PERRLA, EACs-clear, TMs- sl red on left, NOSE-clear, THROAT-clear & wnl. NECK:  Decr ROM; no JVD; normal carotid impulses w/o bruits; +thyroid scar, no thyromegaly or nodules palpated; no lymphadenopathy. CHEST:  Clear to P & A; without wheezes/ rales/ or rhonchi heard... HEART:  Regular Rhythm; without murmurs/ rubs/ or gallops detected... ABDOMEN:  Soft & nontender; normal bowel sounds; no organomegaly or masses palpated... EXT: without deformities, mild arthritic changes; no varicose veins/ +venous insuffic/ no edema. NEURO:  CN's intact; no focal neuro deficits or weakness found... DERM:  no lesions noted...  RADIOLOGY DATA:  Reviewed in the EPIC EMR & discussed w/ the patient...    >>Last CXR 5/12 showed clear lungs x mild scarring left base & apical pleural thickening...    >>Abd Sonar 4/12 showed bilat normal renal size, some bilat cortical thinning, no hydroneph, bladder ok...  LABORATORY DATA:  Reviewed in the EPIC EMR & discussed w/ the patient...   Assessment & Plan:   AB>  Another exac & we will treat w/ Levaquin 500mg  qd, Mucinex 2Bid, Fluids, & Tussionex prn cough...  Hypertension>  Her BP remains controlled, on Amlodipine monotherapy, & specifically her BP is not too low & there are no postural changes;  Continue same med...  Hx of CP>  She was seen 05/13/10 for a routine 10mo check up & daugh mentioned her atypCP & wanted cardiac eval & seen by Cherly Hensen;  Pt did not mention chest discomfort during this visit but she is 79y/o w/ mult cardiac risk factors & prev EKG w/ poor R progression & NSSTTWA...  Diabetes>  Now followed & managed by DrEllison on HUMULIN N...  Hyperlipidemia>  On Lipitor & Fenofibrate, needs better  low fat diet, continue same meds...  Renal Insuffic>  Followed by DrGoldsborough & Creat is stable...  Hypothyroid>  Suddenly TSH was ~20 on same dose & this makes no sense at all; Pharm confirms filling Rx regularly therefore we incr dose to 143mcg/d...  Anemia>  Multifactorial, on B12 shots monthly & Fe/ VitC supplementation...  Anxiety>  She stopped her prn Alprazolam...   Patient's Medications  New Prescriptions   CHLORPHENIRAMINE-HYDROCODONE (TUSSIONEX PENNKINETIC ER) 10-8 MG/5ML LQCR    Take 5 mLs by mouth every 12 (twelve) hours.   DOXYCYCLINE (VIBRA-TABS) 100 MG TABLET    Take 1 tablet (100 mg total) by mouth 2 (two) times daily.   RANITIDINE (ZANTAC) 150 MG CAPSULE    Take 1 capsule (150 mg total) by mouth every evening.  Previous Medications   AMLODIPINE (NORVASC) 5 MG TABLET    Take 1 tablet (5 mg total) by mouth daily.   ASPIRIN 81 MG TABLET    Take 81 mg by mouth daily.     ATORVASTATIN (LIPITOR) 20 MG TABLET    Take 1 tablet (20 mg total) by mouth daily.   CHOLECALCIFEROL (VITAMIN D3) 3000 UNITS TABS    Take 2 capsules by mouth daily.    FENOFIBRATE 160 MG TABLET    Take 1 tablet (160 mg total) by mouth daily.  FERROUS SULFATE 325 (65 FE) MG EC TABLET    Take 325 mg by mouth daily with breakfast.     GLUCOSE BLOOD (BAYER CONTOUR TEST) TEST STRIP    1 each by Other route 2 (two) times daily. And lancets 2/dat  250.03   GNP LANCETS MICRO THIN 33G MISC    Use as directed   INSULIN NPH (HUMULIN N PEN) 100 UNIT/ML INJECTION    Inject 100 Units into the skin every morning.   LEVOTHYROXINE SODIUM 125 MCG CAPS    Take 1 capsule (125 mcg total) by mouth daily.   TRIAMCINOLONE CREAM (KENALOG) 0.1 %    Apply topically 3 (three) times daily. As needed for itching  Modified Medications   Modified Medication Previous Medication   FUROSEMIDE (LASIX) 20 MG TABLET furosemide (LASIX) 20 MG tablet      Take 1 tablet (20 mg total) by mouth daily.    Take 20 mg by mouth daily.  Discontinued  Medications   ESOMEPRAZOLE (NEXIUM) 40 MG CAPSULE    Take 1 capsule (40 mg total) by mouth daily before breakfast.   LEVOFLOXACIN (LEVAQUIN) 500 MG TABLET    Take 1 tablet (500 mg total) by mouth daily.

## 2011-08-05 NOTE — Patient Instructions (Addendum)
Today we updated your med list in our EPIC system...    Continue your current medications the same...  For your Respiratory infection/ Bronchitis>    Take the [facility] antibiotic one tab daily x7d...    We wrote for TUSSIONEX cough syrup- one tsp every 12 H as needed...    Be sure to add-in [facility] OTC 2 tabs twice daily w/ lots of water...  We changed your Nexium to ZANTAC 150mg  daily per your request, and we refilled your Lasix...  Let's keep your prev scheduled f/u appt in August (09/15/11 at 1130AM) & we will do your labs then.Marland KitchenMarland Kitchen

## 2011-08-06 ENCOUNTER — Telehealth: Payer: Self-pay | Admitting: Pulmonary Disease

## 2011-08-06 MED ORDER — DOXYCYCLINE HYCLATE 100 MG PO TABS: 100.0000 mg | ORAL_TABLET | Freq: Two times a day (BID) | ORAL | Status: AC

## 2011-08-06 NOTE — Telephone Encounter (Signed)
Pt's daughter returned call.  She stated that pt took first dose of levaquin last evening and approx 1 hour after he BS dropped to 60.  Estill Bamberg stated that pt did take the levaquin w/ food (a sandwich).  Pt is now refusing to take anymore levaquin.  Also, Estill Bamberg stated that when pt went to the pharmacy yesterday to pick up her scripts, the pharmacist advised her not to take the tussionex b/c of her codeine allergy.  Estill Bamberg did verbalize that she and SN discussed this yesterday and that the tussionex does not have codeine in it.  Pt has been taking the otc tussin syrup for diabetics.  Dr Lenna Gilford please advise, thanks.

## 2011-08-06 NOTE — Telephone Encounter (Signed)
Called spoke with patient's daughter Estill Bamberg and informed her of SN's recs.  Estill Bamberg okay with these recs and verbalized her understanding.  Doxycycline sent to verified pharmacy.  Levaquin removed from med list.  Encouraged Estill Bamberg to call if anything further is needed.

## 2011-08-06 NOTE — Telephone Encounter (Signed)
LMTCB x 1 

## 2011-08-06 NOTE — Telephone Encounter (Signed)
Per SN---cont the otc tussin syrup for diabetics or use the delsym.  Can change the levaquin to doxycycline 100mg    #20  1 po bid until gone.  thanks

## 2011-08-08 ENCOUNTER — Encounter: Payer: Self-pay | Admitting: Pulmonary Disease

## 2011-08-14 ENCOUNTER — Encounter: Payer: Self-pay | Admitting: Pulmonary Disease

## 2011-09-15 ENCOUNTER — Encounter: Payer: Self-pay | Admitting: Pulmonary Disease

## 2011-09-15 ENCOUNTER — Ambulatory Visit (INDEPENDENT_AMBULATORY_CARE_PROVIDER_SITE_OTHER): Payer: Medicare Other | Admitting: Pulmonary Disease

## 2011-09-15 ENCOUNTER — Other Ambulatory Visit (INDEPENDENT_AMBULATORY_CARE_PROVIDER_SITE_OTHER): Payer: Medicare Other

## 2011-09-15 VITALS — BP 130/60 | HR 71 | Temp 97.0°F | Ht 66.0 in | Wt 171.0 lb

## 2011-09-15 DIAGNOSIS — E785 Hyperlipidemia, unspecified: Secondary | ICD-10-CM

## 2011-09-15 DIAGNOSIS — M159 Polyosteoarthritis, unspecified: Secondary | ICD-10-CM

## 2011-09-15 DIAGNOSIS — E1029 Type 1 diabetes mellitus with other diabetic kidney complication: Secondary | ICD-10-CM

## 2011-09-15 DIAGNOSIS — N058 Unspecified nephritic syndrome with other morphologic changes: Secondary | ICD-10-CM

## 2011-09-15 DIAGNOSIS — E1065 Type 1 diabetes mellitus with hyperglycemia: Secondary | ICD-10-CM

## 2011-09-15 DIAGNOSIS — J209 Acute bronchitis, unspecified: Secondary | ICD-10-CM

## 2011-09-15 DIAGNOSIS — R079 Chest pain, unspecified: Secondary | ICD-10-CM

## 2011-09-15 DIAGNOSIS — I10 Essential (primary) hypertension: Secondary | ICD-10-CM

## 2011-09-15 DIAGNOSIS — F411 Generalized anxiety disorder: Secondary | ICD-10-CM

## 2011-09-15 DIAGNOSIS — N259 Disorder resulting from impaired renal tubular function, unspecified: Secondary | ICD-10-CM

## 2011-09-15 DIAGNOSIS — K219 Gastro-esophageal reflux disease without esophagitis: Secondary | ICD-10-CM

## 2011-09-15 DIAGNOSIS — K5730 Diverticulosis of large intestine without perforation or abscess without bleeding: Secondary | ICD-10-CM

## 2011-09-15 DIAGNOSIS — D638 Anemia in other chronic diseases classified elsewhere: Secondary | ICD-10-CM

## 2011-09-15 DIAGNOSIS — M810 Age-related osteoporosis without current pathological fracture: Secondary | ICD-10-CM

## 2011-09-15 DIAGNOSIS — E039 Hypothyroidism, unspecified: Secondary | ICD-10-CM

## 2011-09-15 LAB — BASIC METABOLIC PANEL
GFR: 31.91 mL/min — ABNORMAL LOW (ref >60.00)
Potassium: 4.6 mEq/L (ref 3.5–5.1)
Sodium: 138 mEq/L (ref 135–145)

## 2011-09-15 LAB — HEMOGLOBIN A1C: Hgb A1c MFr Bld: 8.8 % — ABNORMAL HIGH (ref 4.6–6.5)

## 2011-09-15 LAB — LIPID PANEL
HDL: 29.5 mg/dL — ABNORMAL LOW (ref >39.00)
Total CHOL/HDL Ratio: 5
VLDL: 46.4 mg/dL — ABNORMAL HIGH (ref 0.0–40.0)

## 2011-09-15 MED ORDER — CYANOCOBALAMIN 1000 MCG/ML IJ SOLN: 1000.0000 ug | Freq: Once | INTRAMUSCULAR | Status: DC

## 2011-09-15 MED ORDER — CYANOCOBALAMIN 1000 MCG/ML IJ SOLN
1000.0000 ug | Freq: Once | INTRAMUSCULAR | Status: AC
  Administered 2011-09-15: 1000 ug via INTRAMUSCULAR

## 2011-09-15 NOTE — Progress Notes (Signed)
Subjective:    Patient ID: Jocelyn Mendez, female    DOB: 1930-04-29, 78 y.o.   MRN: [id number]  HPI 78 y/o WF here for a follow up visit... she has mult med problems including:  HBP, Hypercholesterolemia, DM, mild renal insuffic, & multifactorial anemia w/ B12 defic on shots monthly... she also has DJD, LBP, Osteopenia, etc...  << see prev notes for earlier entries >>  ~  Jul 04, 2010:  s/p Hosp by [facility] in April & May for dehydration, renal insuffic, etc; then UTI (sens EColi), weakness, DM, ?type 4 RTA> see DCSummary...  Told she has kidney disease & they have appt w/ Nephrology pending- set up by the hospitalists; they also want appt w/ diabetic specialist- we will arrange this per their request...  ~  August 12, 2010:  42mo ROV & review> She will now be receiving her care by committee:  Seen by Nephrology recently (note pending) & she tells me that "they stopped the Metformin & raised the little blue pill to 1&1/2 daily";  She has appt w/ DrKerr for DM/Endocrine July 24 & her home BS checks have all been in the 200's (currently on [place]);  BP remains controlled on meds as below;  Lipids fair on Lip20 & Fenofib160 but needs better low fat diet & incr exerc;  Thyroid stable on 172mcg/d;  See prob list>>  ~  November 12, 2010:  25mo ROV> she requested add-on appt for "cold" w/ cough, yellow sput, chest congestion; ?low grade fever w/o chills or sweats; Augmentin called in for pt & she is improved, also rec to take Mucinex 2Bid + Fluids po...  BP controlled on Amlodipine;  DM under treatment from [place] on [place] but A1c still out of control;  Lipids regulated by [facility]- needs ret for FLP;  Thyroid stable on Synthroid112;  Other problems as noted below...  ~  March 17, 2011:  70mo ROV & she notes doing better, no new complaints or concerns; followed by DrKerr every 25mo & DrGoldsborough every 6 months>    HBP> on Norvasc5; BP= 130/70 & stable; denies CP, palpit, ch in dyspnea or  edema...    DM> on LEVEMIR (now 46uBid) & Onglyza 2.5mg /d; followed by DrKerr & his note of 10/12 is reviewed- A1c= 9.0 then while on 80u Levemir/d... LABS TODAY show FBS=229, A1c=11.0, and copies sent to Endocrine.    Hyperlipid> on Lip20 + Fenofib160; needs to ret fasting for f/u FLP ==> TChol 147, TG 165, HDL 30, LDL 85; rec better low fat diet & incr exercise...    Hypothyroid> on Synthroid 124mcg/d; TSH had been controlled on this dose but f/u TSH= 20.8 & pharm confirms regular refills! This really doesn't make much sense but we will incr Synthroid to 151mcg/d dose...     GI> GERD, Divertics> on Nexium prn + Miralax & Senakot-S...    Renal Insuffic> followed by Nephrology [place] & her note of 10/12 is reviewed & Creat was 1.8.Marland KitchenMarland Kitchen    DJD, LBP, Osteopenia> she knows to avoid NSAIDs & uses Tylenol as needed + Vit D supplement...    Anxiety> she is not currently on an anxiolytic agent even though I have rec low dose Alpraz in the past...    Anemia & B12 Defic> on Fe orally & B12 shots monthly (she notes her Pharm was out of parenteral B12 recently); Hg stable in the 10-12 range...  ~  August 05, 2011:  4-87mo ROV and [place] called  for an add-on visit due to an upper resp infection w/ cough, yellow-green sputum, low grade fever & aching all over; taking Robitssin & Tylenol but no better; CXR today shows some peribronchial thickening but no infiltrate & exam shows scat rhonchi but no consolidation; we decided to treat w/ Levaquin, Mucinex, Fluids,  & Tussionex for her cough w/ CP...    She has had 4 f/u visits w/ DrEllison since I last saw her> her A1c is improved down to 8.9 recently; she is on HumulinN 100uQd; pt & daugh likes DrEllison...    She wants to stop the Nexium (too expensive) & switch to Zantac; I suggested Prilosec ($20 for 90 pills at [facility]) but they want the Zantac...    She saw Nephrology, Guinevere Scarlet 4/13> Stage III CKD, Creat in the 1.5-2.0 range, GFR in the 30s, BP &  vol status were good=> given LASIX 20mg  to use prn swelling.    We reviewed prob list, meds, xrays and labs> see below>> CXR 6/13 showed norm heart size, min streaky atx at bases w/ sl peribronchial thickening c/w bronchitis, otherw clear, NAD.Marland KitchenMarland Kitchen  ~  September 15, 2011:  6wk Gilliam is doing reasonably well overall> she is requesting a new DM meter & we wrote perscription for this to get at her Pharm;  On Humulin N 100u daily & she notes BS "all over the place" & blames her meter- we reviewed diet, exercise, proper insulin injection technique etc;  BP controlled on Norvasc & doesn't use the Lasix except maybe once or twice per month;  Due for f/u labs today & tells me she's taking everything regularly (including the Levothy125);  She has some mod CWP & tender- discussed rest, heat, offered Tramadol but she prefers to stick w/ Tylenol alone...    We reviewed prob list, meds, xrays and labs> see below for updates >> LABS 8/13:  FLP- chol ok but TG=232 HDL=30 on Lip20+Feno160;  [place]- BS=89 A1c=8.8 on NPH100u/d per DrEllison, & BUN=34 Creat=1.7 (stable);  TSH=20 on Levothy125...           Problem List:  Hx of ASTHMATIC BRONCHITIS, ACUTE (ICD-466.0) - no recent bronchitic episodes... the increased activity has really helped her... ~  CXR 5/12 showed clear lungs x mild scarring left base & apical pleural thickening... ~  CXR 6/13 showed norm heart size, min streaky atx at basesw/ sl peribronchial thickening c/w bronchitis, otherw clear, NAD...  HYPERTENSION (ICD-401.9) - prev on Diovan/HCT but this was stopped 4/12 Hosp & now on NORVASC 5mg /d... ~  10/12:  BP= 128/62 & she denies CP, palpit, syncope, SOB, edema... ~  2/13:  BP= 130/70 & she feels well & denies symptoms... ~  4/13:  DrGoldsborough started LASIX 20mg /d as needed for swelling... ~  6/13:  BP= 120/60 & she notes some CP from her cough, but no palpit/ ch in SOB, etc... ~  8/13:  BP= 130/60 & although she c/o left CWP, she denies palpit,  ch in SOB, recent edema, etc...  Hx of CHEST PAIN (ICD-786.50) - on ASA 81mg /d;  she continues to experience intermittent sharp left CWP- Rx w/ Tylenol Prn. ~  cath 1988 showed normal coronaries, min MVP... ~  Cards eval 4/12 by DrNishan> mult risk factors, atypCP, EKG w/ NSSTTWA, planned Myoview but not done due to subseq hosp 4/12... ~  8/13:  She is c/o left CWP- sharp, tender in left axillary area, etc; discussed rest, heat, Tylenol (she does not want stronger pain  med).  DM (ICD-250.00) - followed by DrEllison> DM w/ Nephropathy & Neuropathy> on HUMULIN N 100u daily; (off Metformin rx since 4/12 hosp)... ~  labs 8/08 showed BS= 113, HgA1c=5.4.Marland KitchenMarland Kitchen On Metform + Glimep. ~  labs 4/09 showed BS= 99, HgA1c= 5.5.Marland KitchenMarland Kitchen Stopped Glimep. ~  labs 4/10 showed BS= 125, A1c= 7.1.Marland Kitchen. rec> better diet, same meds for now. ~  labs 4/11 showed BS= 266, A1c= 9.1.Marland KitchenMarland Kitchen what happened? Add Glimep1mg /d back. ~  labs 6/11 showed BS= 195, A1c= 7.9.Marland KitchenMarland Kitchen incr Glimep to 2mg /d. ~  labs 10/11 showed BS= 175, A1c= 7.9.Marland KitchenMarland Kitchen rec incr Gimep to 4mg /d. ~  Labs in [facility] 5/12 showed BS= 232==>139; A1c=9.7; Metform stopped in hosp, on [place]. ~  Labs 5/12 post hosp showed BS= 255, A1c= 8.6.Marland KitchenMarland Kitchen Referred to Endocrinology. ~  7/12:  BS at home all in the 200s, she doesn't want additional meds, wants to wait for appt w/ DrKerr 7/24. ~  8/12:  She was seen by DrKerr & her started her on Levemir insulin + Onglyza 2.5mg /d ==> his notes & labs are reviewed... ~  10/12: Note from DrKerr> pt on Levemir 80u + Onglyza 2.5mg /d w/ A1c= 9.0.Marland Kitchen. ~  Labs here 2/13 showed FBS= 229, A1c= 11.0 & we will fax to DrKerr for his review... ~  Pt switched to DrEllison in the interval> meds changed to HUMULIN N & now on 100u daily w/ recent A1c=8.9.Marland Kitchen. ~  Labs 8/13 on HumulinN100/d showed BS= 89, A1c= 8.8... We reviewed diet, exercise, & f/u w/ DrEllison.  HYPERLIPIDEMIA (ICD-272.4) - on LIPITOR 20mg /d & FENOFIBRATE 160mg /d (ch from Lopid 10/10). ~  FLP 8/08  showed TChol 129, TG 197, HDL 17, LDL 73... despite taking statin & fibrate drugs. ~  FLP 4/09 on Lip20+Tric145 showed TChol 127, TG 269, HDL 13, LDL 69... rec> ch Tricor to [phone]. ~  [place] 10/09 on Lip20+Trilip135 showed TChol 124, TG 247, HDL 10!, LDL 64... rec> ch Trilip to LopidBid. ~  FLP 4/10 on Lip20+LopidBid showed TChol 174, TG 239, HDL 38, LDL 103... rec> ch Lopid to Feno160 (she never did). ~  FLP 10/10 showed TChol 134, TG 256, HDL 10, LDL 76... change the Lopid to Fenofib160/d. ~  FLP 4/11 on Lip20+Feno160 showed TChol 163, TG 234, HDL 28, LDL 108... keep same, better diet! ~  FLP 10/11 on Lip20+Feno160 showed TChol 172, Tg 304, HDL 26, LDL 112... take meds regularly & better diet. ~  FLP 4/12 on Lip20+Feno160 showed TChol 160, TG 258, HDL 19, LDL 100... Needs better low fat diet. ~  FLP 2/13 on Lip20+Feno160 showed TChol 147, TG 165, HDL 30, LDL 85 ~  FLP 8/13 on Lip20+Feno160 showed TChol 159, TG 232, HDL 30, LDL 99  HYPOTHYROIDISM (ICD-244.9) - Hx remote Thyroid surg & scar in neck> on SYNTHROID 119mcg/d... ~  last TSH 1/08 = 0.41 ~  labs 4/09 showed TSH= 2.35 ~  labs 10/09 on Levothy125 showed TSH= 0.97 ~  labs 4/10 on Levothy125 showed TSH= 0.08... rec> decr [phone]. ~  labs 10/10 on Levothy112 showed TSH= 2.18... keep same. ~  labs 4/11 on Levo112 showed TSH= 0.51 ~  Labs 4/12 on Levo112 showed TSH= 1.02 ~  Labs 2/13 on Levo112 showed TSH= 20.8.Marland KitchenMarland Kitchen Why? Pharm confirms regular refills, we will incr dose to 158mcg/d. ~  Labs 8/13 on Levo125 showed TSH= 20.2.Marland KitchenMarland Kitchen We decided to incr LEVOTHYROID 171mcg/d...  GERD (ICD-530.81) - takes NEXIUM 40mg  Prn (states Omep20 "makes me sick")...  ~  EGD was planned  in 2006 eval but never done... ~  6/13:  She want to switch to ZANTAC 150mg /d since Nexium is too expensive  DIVERTICULOSIS OF COLON (ICD-562.10) - c/o constip & rec> Miralax + Senakot-S... ~  last colonoscopy 3/06 by DrDBrodie showed divertics only...  RENAL INSUFFICIENCY  (ICD-588.9) - prev Creat in the 1.7 - 2.0 range... ~  labs 8/08 showed BUN=26, Creat=1.5 ~  labs 4/09 showed BUN= 34, Creat= 1.5 ~  labs 10/09 showed BUN= 31, Creat= 1.5 ~  labs 4/10 showed BUN= 28, Creat= 1.0, Microalb= pos... continue Rx (on Diovan). ~  labs 10/10 showed BUN= 22, Creat= 1.2 ~  labs 4/11 showed BUN= 20, Creat= 1.5 ~  labs 6/11 showed BUN= 20, Creat= 1.3 ~  labs 10/11 showed BUN= 27, Creat= 1.5 ~  Renal Ultrasound 5/12 was neg- NAD, no hydronephrosis... ~  Labs in [facility] 5/12 showed Creat= 2.11==>1.4 ~  Labs 5/12 post hosp showed BUN=44, Creat=1.7, K=5.7, HCO3=23;  She has appt w/ Nephrology set up by the Hospitalists. ~  6/12 & 10/12:  Pt seen by DrGoldsborough & her notes are reviewed... ~  Labs 2/13 showed BUN= 33, Creat= 1.6 ~  Labs 8/13 showed BUN= 34, Creat= 1.7  DEGENERATIVE JOINT DISEASE, GENERALIZED (ICD-715.00) - she uses Tylenol Prn. LOW BACK PAIN (ICD-724.2) OSTEOPOROSIS (ICD-733.00) - she takes calcium + vits daily... ~  BMD here 2/06 showed TScores -1.4 Spine,  -1.1 left FemNeck... ~  labs 4/09 showed Vit D level= 24... rec OTC Vit D 1000 u daily, but she stopped this. ~  labs 4/10 showed Vit D level = 20... rec> incr OTC Vit D 2000 u daily (she never did). ~  labs 4/11 showed Vit D level = 22...Marland KitchenMarland KitchenMarland Kitchen rec> incr to 2000 u daily. ~  Labs 4/12 showed Vit D level = 32  HEADACHE (ICD-784.0)  ANXIETY (ICD-300.00) - she notes her Bro has emphysema, lung cancer and CHF... she takes ALPRAZOLAM 0.5mg  Prn.  ANEMIA>  Hx ACD & IronDefic>  Rx FeSO4 + VitC ~  Hx of multifactorial anemia w/ Hg=11 in 2005, Fe was OK, Stools neg for blood, Folate norm, B12 found low, & Creat 1.5+ ~  labs 1/08 showed Hg=11.1 ~  labs 8/08 showed Hg=9.9 ~  labs 4/09 showed Hg= 9.6, Fe=102, TIBC=344, Sat=21% ~  labs 10/09 showed Hg= 9.1, MCV= 92 ~  labs 4/10 showed = 11.1, Fe= 71 ~  labs 10/10 showed Hg= 9.3, MCV= 93... rec Fe Bid w/ Vit C... ~  labs 4/11 showed Hg= 11.8, MCV=87, Fe=86 ~   labs 10/11 showed Hg= 10.9, MCV= 89 ~  Labs in [facility] 5/12 showed Hg= 10.3==>8.6, Fe=19 (5%sat), B12=505, Folate=16, SPE= neg. ~  Labs 2/13 showed Hg= 12.0  VITAMIN B12 DEFICIENCY (ICD-266.2) - Vit B12 level was 77 in 2006 w/ pos parietal cell antibodies; she's been on B12 shots ever since then...  ~  f/u B12 level 4/10 = >1500 ~  f/u B12 level 5/12= 505 ~  She remains on monthly Vit B12 shots...   Past Surgical History  Procedure Date  . Appendectomy     Outpatient Encounter Prescriptions as of 09/15/2011  Medication Sig Dispense Refill  . amLODipine (NORVASC) 5 MG tablet Take 1 tablet (5 mg total) by mouth daily.  30 tablet  6  . aspirin 81 MG tablet Take 81 mg by mouth daily.        Marland Kitchen atorvastatin (LIPITOR) 20 MG tablet Take 1 tablet (20 mg total) by mouth daily.  30 tablet  6  . Cholecalciferol (VITAMIN D3) 3000 UNITS TABS Take 2 capsules by mouth daily.       . fenofibrate 160 MG tablet Take 1 tablet (160 mg total) by mouth daily.  30 tablet  11  . ferrous sulfate 325 (65 FE) MG EC tablet Take 325 mg by mouth 2 (two) times daily.       . furosemide (LASIX) 20 MG tablet Take 1 tablet (20 mg total) by mouth daily.  30 tablet  11  . glucose blood (BAYER CONTOUR TEST) test strip 1 each by Other route 2 (two) times daily. And lancets 2/dat  250.03      . GNP LANCETS MICRO THIN 33G MISC Use as directed  100 each  6  . insulin NPH (HUMULIN N PEN) 100 UNIT/ML injection Inject 100 Units into the skin every morning.  30 mL  11  . Levothyroxine Sodium 125 MCG CAPS Take 1 capsule (125 mcg total) by mouth daily.  30 capsule  11  . ranitidine (ZANTAC) 150 MG capsule Take 1 capsule (150 mg total) by mouth every evening.  30 capsule  11  . triamcinolone cream (KENALOG) 0.1 % Apply topically 3 (three) times daily. As needed for itching  30 g  1  . DISCONTD: chlorpheniramine-HYDROcodone (TUSSIONEX PENNKINETIC ER) 10-8 MG/5ML LQCR Take 5 mLs by mouth every 12 (twelve) hours.  140 mL  1    Allergies    Allergen Reactions  . Codeine     REACTION: change in mental status  . Diphenhydramine Hcl     REACTION: itching  . Quinine     REACTION: abnormal sensations in face  . Sulfonamide Derivatives     REACTION: rash    Current Medications, Allergies, Past Medical History, Past Surgical History, Family History, and Social History were reviewed in [employer] record.    Review of Systems        See HPI - all other systems neg except as noted... The patient complains of nausea & diarrhea- now resolved.  The patient denies anorexia, fever, weight loss, weight gain, vision loss, decreased hearing, hoarseness, syncope, peripheral edema, prolonged cough, headaches, hemoptysis, abdominal pain, melena, hematochezia, severe indigestion/heartburn, hematuria, incontinence, suspicious skin lesions, transient blindness, depression, unusual weight change, abnormal bleeding, enlarged lymph nodes, and angioedema.     Objective:   Physical Exam      WD, WN, 78 y/o WF in NAD...  VS reviewed & BP= 120/64 w/o postural changes... GENERAL:  Alert & oriented; pleasant & cooperative... HEENT:  [place]/AT, EOM-wnl, PERRLA, EACs-clear, TMs- sl red on left, NOSE-clear, THROAT-clear & wnl. NECK:  Decr ROM; no JVD; normal carotid impulses w/o bruits; +thyroid scar, no thyromegaly or nodules palpated; no lymphadenopathy. CHEST:  Clear to P & A; without wheezes/ rales/ or rhonchi heard; +tender chest wall & pectoralis area. HEART:  Regular Rhythm; without murmurs/ rubs/ or gallops detected... ABDOMEN:  Soft & nontender; normal bowel sounds; no organomegaly or masses palpated... EXT: without deformities, mild arthritic changes; no varicose veins/ +venous insuffic/ no edema. NEURO:  CN's intact; no focal neuro deficits or weakness found... DERM:  no lesions noted...  RADIOLOGY DATA:  Reviewed in the EPIC EMR & discussed w/ the patient...   LABORATORY DATA:  Reviewed in the EPIC EMR & discussed w/  the patient...   Assessment & Plan:   AB>  No recent infectious exac; not on regular meds...  Hypertension>  Her BP remains controlled on Amlodipine monotherapy, &  specifically her BP is not too low & there are no postural changes;  Continue same med...  Hx of CP>  She was seen 05/13/10 for a routine 28mo check up & daugh mentioned her atypCP & wanted cardiac eval & seen by Cherly Hensen;  Pt did not mention chest discomfort during this visit but she is 79y/o w/ mult cardiac risk factors & prev EKG w/ poor R progression & NSSTTWA...  Diabetes>  Now followed & managed by DrEllison on HUMULIN N at 100u daily;   Hyperlipidemia>  On Lipitor & Fenofibrate, needs better low fat diet, continue same meds...  Renal Insuffic>  Followed by DrGoldsborough & Creat is stable...  Hypothyroid>  Suddenly TSH was ~20 on 156mcg/d & this makes no sense at all; Pharm confirms filling Rx regularly therefore we incr dose to 133mcg/d...  Anemia>  Multifactorial, on B12 shots monthly & Fe/ VitC supplementation...  Anxiety>  She stopped her prn Alprazolam...   Patient's Medications  New Prescriptions   No medications on file  Previous Medications   AMLODIPINE (NORVASC) 5 MG TABLET    Take 1 tablet (5 mg total) by mouth daily.   ASPIRIN 81 MG TABLET    Take 81 mg by mouth daily.     ATORVASTATIN (LIPITOR) 20 MG TABLET    Take 1 tablet (20 mg total) by mouth daily.   CHOLECALCIFEROL (VITAMIN D3) 3000 UNITS TABS    Take 2 capsules by mouth daily.    FENOFIBRATE 160 MG TABLET    Take 1 tablet (160 mg total) by mouth daily.   FERROUS SULFATE 325 (65 FE) MG EC TABLET    Take 325 mg by mouth 2 (two) times daily.    FUROSEMIDE (LASIX) 20 MG TABLET    Take 1 tablet (20 mg total) by mouth daily.   GLUCOSE BLOOD (BAYER CONTOUR TEST) TEST STRIP    1 each by Other route 2 (two) times daily. And lancets 2/dat  250.03   GNP LANCETS MICRO THIN 33G MISC    Use as directed   INSULIN NPH (HUMULIN N PEN) 100 UNIT/ML INJECTION     Inject 100 Units into the skin every morning.   LEVOTHYROXINE SODIUM 125 MCG CAPS    Take 1 capsule (125 mcg total) by mouth daily.   RANITIDINE (ZANTAC) 150 MG CAPSULE    Take 1 capsule (150 mg total) by mouth every evening.   TRIAMCINOLONE CREAM (KENALOG) 0.1 %    Apply topically 3 (three) times daily. As needed for itching  Modified Medications   No medications on file  Discontinued Medications   CHLORPHENIRAMINE-HYDROCODONE (TUSSIONEX PENNKINETIC ER) 10-8 MG/5ML LQCR    Take 5 mLs by mouth every 12 (twelve) hours.

## 2011-09-15 NOTE — Patient Instructions (Addendum)
Today we updated your med list in our EPIC system...    Continue your current medications the same...  Today we did your follow up lab work...    We will call you w/ the results later this wk...  Stay as active as possible...  Call for any questions...  Let's plan a follow up visit in 4 months or so.Marland KitchenMarland Kitchen

## 2011-09-17 ENCOUNTER — Other Ambulatory Visit: Payer: Self-pay | Admitting: Pulmonary Disease

## 2011-09-17 MED ORDER — LEVOTHYROXINE SODIUM 150 MCG PO TABS: 150.0000 ug | ORAL_TABLET | Freq: Every day | ORAL | Status: DC

## 2011-09-25 ENCOUNTER — Other Ambulatory Visit: Payer: Self-pay | Admitting: Pulmonary Disease

## 2011-09-25 MED ORDER — INSULIN PEN NEEDLE 31G X 8 MM MISC: Status: DC

## 2011-09-30 ENCOUNTER — Other Ambulatory Visit: Payer: Self-pay | Admitting: Pulmonary Disease

## 2011-10-16 ENCOUNTER — Telehealth: Payer: Self-pay | Admitting: Pulmonary Disease

## 2011-10-16 ENCOUNTER — Encounter: Payer: Self-pay | Admitting: Pulmonary Disease

## 2011-10-16 ENCOUNTER — Ambulatory Visit (INDEPENDENT_AMBULATORY_CARE_PROVIDER_SITE_OTHER): Payer: Medicare Other | Admitting: Pulmonary Disease

## 2011-10-16 VITALS — BP 140/62 | HR 87 | Temp 98.3°F | Ht 66.0 in | Wt 172.8 lb

## 2011-10-16 DIAGNOSIS — M545 Low back pain: Secondary | ICD-10-CM

## 2011-10-16 DIAGNOSIS — J209 Acute bronchitis, unspecified: Secondary | ICD-10-CM

## 2011-10-16 DIAGNOSIS — L723 Sebaceous cyst: Secondary | ICD-10-CM

## 2011-10-16 DIAGNOSIS — E538 Deficiency of other specified B group vitamins: Secondary | ICD-10-CM

## 2011-10-16 DIAGNOSIS — R269 Unspecified abnormalities of gait and mobility: Secondary | ICD-10-CM

## 2011-10-16 DIAGNOSIS — L089 Local infection of the skin and subcutaneous tissue, unspecified: Secondary | ICD-10-CM

## 2011-10-16 DIAGNOSIS — E785 Hyperlipidemia, unspecified: Secondary | ICD-10-CM

## 2011-10-16 DIAGNOSIS — I10 Essential (primary) hypertension: Secondary | ICD-10-CM

## 2011-10-16 DIAGNOSIS — M159 Polyosteoarthritis, unspecified: Secondary | ICD-10-CM

## 2011-10-16 DIAGNOSIS — N259 Disorder resulting from impaired renal tubular function, unspecified: Secondary | ICD-10-CM

## 2011-10-16 DIAGNOSIS — Z23 Encounter for immunization: Secondary | ICD-10-CM

## 2011-10-16 MED ORDER — CEFADROXIL 500 MG PO CAPS: 500.0000 mg | ORAL_CAPSULE | Freq: Two times a day (BID) | ORAL | Status: AC

## 2011-10-16 MED ORDER — GLUCOSE BLOOD VI STRP: 1.0000 | ORAL_STRIP | Freq: Four times a day (QID) | Status: DC | PRN

## 2011-10-16 NOTE — Telephone Encounter (Signed)
Per SN---have pt come today at 2:30.  thanks

## 2011-10-16 NOTE — Patient Instructions (Addendum)
Today we updated your med list in our EPIC system...    Continue your current medications the same...  For the infected cyst (boil):    Use hot soaks 3-4 time daily & clean w/ mild soapy water...    Cover w/ neosporin & band-aid vs gauze pad & tape...    Take the DURICEF antibiotic twice daily til gone...    Take the PROBIOTIC "Align" once daily while on the [place]...  We gave you the 2013 Flu vaccine today, and your monthly B12 shot...  Call for any questions or if the cyst is not responding to treatment.Marland KitchenMarland Kitchen

## 2011-10-16 NOTE — Progress Notes (Signed)
Subjective:    Patient ID: Jocelyn Mendez, female    DOB: Feb 17, 1930, 78 y.o.   MRN: [id number]  HPI 78 y/o WF here for a follow up visit... she has mult med problems including:  HBP, Hypercholesterolemia, DM, mild renal insuffic, & multifactorial anemia w/ B12 defic on shots monthly... she also has DJD, LBP, Osteopenia, etc...  << see prev notes for earlier entries >>  ~  Jul 04, 2010:  s/p Hosp by [facility] in April & May for dehydration, renal insuffic, etc; then UTI (sens EColi), weakness, DM, ?type 4 RTA> see DCSummary...  Told she has kidney disease & they have appt w/ Nephrology pending- set up by the hospitalists; they also want appt w/ diabetic specialist- we will arrange this per their request...  ~  August 12, 2010:  39mo ROV & review> She will now be receiving her care by committee:  Seen by Nephrology recently (note pending) & she tells me that "they stopped the Metformin & raised the little blue pill to 1&1/2 daily";  She has appt w/ DrKerr for DM/Endocrine July 24 & her home BS checks have all been in the 200's (currently on [place]);  BP remains controlled on meds as below;  Lipids fair on Lip20 & Fenofib160 but needs better low fat diet & incr exerc;  Thyroid stable on 150mcg/d;  See prob list>>  ~  November 12, 2010:  36mo ROV> she requested add-on appt for "cold" w/ cough, yellow sput, chest congestion; ?low grade fever w/o chills or sweats; Augmentin called in for pt & she is improved, also rec to take Mucinex 2Bid + Fluids po...  BP controlled on Amlodipine;  DM under treatment from [facility] on [place] but A1c still out of control;  Lipids regulated by [facility]- needs ret for FLP;  Thyroid stable on Synthroid112;  Other problems as noted below...  ~  March 17, 2011:  17mo ROV & she notes doing better, no new complaints or concerns; followed by DrKerr every 31mo & DrGoldsborough every 6 months>    HBP> on Norvasc5; BP= 130/70 & stable; denies CP, palpit, ch in dyspnea or  edema...    DM> on LEVEMIR (now 46uBid) & Onglyza 2.5mg /d; followed by DrKerr & his note of 10/12 is reviewed- A1c= 9.0 then while on 80u Levemir/d... LABS TODAY show FBS=229, A1c=11.0, and copies sent to Endocrine.    Hyperlipid> on Lip20 + Fenofib160; needs to ret fasting for f/u FLP ==> TChol 147, TG 165, HDL 30, LDL 85; rec better low fat diet & incr exercise...    Hypothyroid> on Synthroid 181mcg/d; TSH had been controlled on this dose but f/u TSH= 20.8 & pharm confirms regular refills! This really doesn't make much sense but we will incr Synthroid to 156mcg/d dose...     GI> GERD, Divertics> on Nexium prn + Miralax & Senakot-S...    Renal Insuffic> followed by Nephrology [place] & her note of 10/12 is reviewed & Creat was 1.8.Marland KitchenMarland Kitchen    DJD, LBP, Osteopenia> she knows to avoid NSAIDs & uses Tylenol as needed + Vit D supplement...    Anxiety> she is not currently on an anxiolytic agent even though I have rec low dose Alpraz in the past...    Anemia & B12 Defic> on Fe orally & B12 shots monthly (she notes her Pharm was out of parenteral B12 recently); Hg stable in the 10-12 range...  ~  August 05, 2011:  4-49mo ROV and [place] called  for an add-on visit due to an upper resp infection w/ cough, yellow-green sputum, low grade fever & aching all over; taking Robitssin & Tylenol but no better; CXR today shows some peribronchial thickening but no infiltrate & exam shows scat rhonchi but no consolidation; we decided to treat w/ Levaquin, Mucinex, Fluids,  & Tussionex for her cough w/ CP...    She has had 4 f/u visits w/ DrEllison since I last saw her> her A1c is improved down to 8.9 recently; she is on HumulinN 100uQd; pt & daugh likes DrEllison...    She wants to stop the Nexium (too expensive) & switch to Zantac; I suggested Prilosec ($20 for 90 pills at [facility]) but they want the Zantac...    She saw Nephrology, Guinevere Scarlet 4/13> Stage III CKD, Creat in the 1.5-2.0 range, GFR in the 30s, BP &  vol status were good=> given LASIX 20mg  to use prn swelling.    We reviewed prob list, meds, xrays and labs> see below>> CXR 6/13 showed norm heart size, min streaky atx at bases w/ sl peribronchial thickening c/w bronchitis, otherw clear, NAD.Marland KitchenMarland Kitchen  ~  September 15, 2011:  6wk [place] is doing reasonably well overall> she is requesting a new DM meter & we wrote perscription for this to get at her Pharm;  On Humulin N 100u daily & she notes BS "all over the place" & blames her meter- we reviewed diet, exercise, proper insulin injection technique etc;  BP controlled on Norvasc & doesn't use the Lasix except maybe once or twice per month;  Due for f/u labs today & tells me she's taking everything regularly (including the Levothy125);  She has some mod CWP & tender- discussed rest, heat, offered Tramadol but she prefers to stick w/ Tylenol alone...    We reviewed prob list, meds, xrays and labs> see below for updates >> LABS 8/13:  FLP- chol ok but TG=232 HDL=30 on Lip20+Feno160;  [place]- BS=89 A1c=8.8 on NPH100u/d per DrEllison, & BUN=34 Creat=1.7 (stable);  TSH=20 on Levothy125...  ~  October 16, 2011:  68mo ROV & urgent add-on appt for infected cyst on her back> daugh called today w/ raised red tender knot on upper right back; exam shows a mod size sebaceous cyst, tender to palpation w/ sm amt beige pus draining; no other lesion apparent, no f/c/s, VS are stable & we discussed Rx w/ warm compresses, apply neosporin, clean gauze, etc; start Rx w/ Duricef 500mg  Bid x10d & take Align...  Reminded to drink plenty of water, stay well hydrated & check BS regularly;  She requests 2013 Flu vaccine today- ok...          Problem List:  Hx of ASTHMATIC BRONCHITIS, ACUTE (ICD-466.0) - no recent bronchitic episodes... the increased activity has really helped her... ~  CXR 5/12 showed clear lungs x mild scarring left base & apical pleural thickening... ~  CXR 6/13 showed norm heart size, min streaky atx at basesw/ sl  peribronchial thickening c/w bronchitis, otherw clear, NAD...  HYPERTENSION (ICD-401.9) - prev on Diovan/HCT but this was stopped 4/12 Hosp & now on NORVASC 5mg /d... ~  10/12:  BP= 128/62 & she denies CP, palpit, syncope, SOB, edema... ~  2/13:  BP= 130/70 & she feels well & denies symptoms... ~  4/13:  DrGoldsborough started LASIX 20mg /d as needed for swelling... ~  6/13:  BP= 120/60 & she notes some CP from her cough, but no palpit/ ch in SOB, etc... ~  8/13:  BP= 130/60 & although she c/o left CWP, she denies palpit, ch in SOB, recent edema, etc...  Hx of CHEST PAIN (ICD-786.50) - on ASA 81mg /d;  she continues to experience intermittent sharp left CWP- Rx w/ Tylenol Prn. ~  cath 1988 showed normal coronaries, min MVP... ~  Cards eval 4/12 by DrNishan> mult risk factors, atypCP, EKG w/ NSSTTWA, planned Myoview but not done due to subseq hosp 4/12... ~  8/13:  She is c/o left CWP- sharp, tender in left axillary area, etc; discussed rest, heat, Tylenol (she does not want stronger pain med).  DM (ICD-250.00) - followed by DrEllison> DM w/ Nephropathy & Neuropathy> on HUMULIN N 100u daily; (off Metformin rx since 4/12 hosp)... ~  labs 8/08 showed BS= 113, HgA1c=5.4.Marland KitchenMarland Kitchen On Metform + Glimep. ~  labs 4/09 showed BS= 99, HgA1c= 5.5.Marland KitchenMarland Kitchen Stopped Glimep. ~  labs 4/10 showed BS= 125, A1c= 7.1.Marland Kitchen. rec> better diet, same meds for now. ~  labs 4/11 showed BS= 266, A1c= 9.1.Marland KitchenMarland Kitchen what happened? Add Glimep1mg /d back. ~  labs 6/11 showed BS= 195, A1c= 7.9.Marland KitchenMarland Kitchen incr Glimep to 2mg /d. ~  labs 10/11 showed BS= 175, A1c= 7.9.Marland KitchenMarland Kitchen rec incr Gimep to 4mg /d. ~  Labs in [facility] 5/12 showed BS= 232==>139; A1c=9.7; Metform stopped in hosp, on [place]. ~  Labs 5/12 post hosp showed BS= 255, A1c= 8.6.Marland KitchenMarland Kitchen Referred to Endocrinology. ~  7/12:  BS at home all in the 200s, she doesn't want additional meds, wants to wait for appt w/ DrKerr 7/24. ~  8/12:  She was seen by DrKerr & her started her on Levemir insulin + Onglyza 2.5mg /d  ==> his notes & labs are reviewed... ~  10/12: Note from DrKerr> pt on Levemir 80u + Onglyza 2.5mg /d w/ A1c= 9.0.Marland Kitchen. ~  Labs here 2/13 showed FBS= 229, A1c= 11.0 & we will fax to DrKerr for his review... ~  Pt switched to DrEllison in the interval> meds changed to HUMULIN N & now on 100u daily w/ recent A1c=8.9.Marland Kitchen. ~  Labs 8/13 on HumulinN100/d showed BS= 89, A1c= 8.8... We reviewed diet, exercise, & f/u w/ DrEllison.  HYPERLIPIDEMIA (ICD-272.4) - on LIPITOR 20mg /d & FENOFIBRATE 160mg /d (ch from Lopid 10/10). ~  FLP 8/08 showed TChol 129, TG 197, HDL 17, LDL 73... despite taking statin & fibrate drugs. ~  FLP 4/09 on Lip20+Tric145 showed TChol 127, TG 269, HDL 13, LDL 69... rec> ch Tricor to [phone]. ~  [place] 10/09 on Lip20+Trilip135 showed TChol 124, TG 247, HDL 10!, LDL 64... rec> ch Trilip to LopidBid. ~  FLP 4/10 on Lip20+LopidBid showed TChol 174, TG 239, HDL 38, LDL 103... rec> ch Lopid to Feno160 (she never did). ~  FLP 10/10 showed TChol 134, TG 256, HDL 10, LDL 76... change the Lopid to Fenofib160/d. ~  FLP 4/11 on Lip20+Feno160 showed TChol 163, TG 234, HDL 28, LDL 108... keep same, better diet! ~  FLP 10/11 on Lip20+Feno160 showed TChol 172, Tg 304, HDL 26, LDL 112... take meds regularly & better diet. ~  FLP 4/12 on Lip20+Feno160 showed TChol 160, TG 258, HDL 19, LDL 100... Needs better low fat diet. ~  FLP 2/13 on Lip20+Feno160 showed TChol 147, TG 165, HDL 30, LDL 85 ~  FLP 8/13 on Lip20+Feno160 showed TChol 159, TG 232, HDL 30, LDL 99  HYPOTHYROIDISM (ICD-244.9) - Hx remote Thyroid surg & scar in neck> on SYNTHROID 116mcg/d... ~  last TSH 1/08 = 0.41 ~  labs 4/09 showed TSH= 2.35 ~  labs 10/09 on  [id number] showed TSH= 0.97 ~  labs 4/10 on Levothy125 showed TSH= 0.08... rec> decr [phone]. ~  labs 10/10 on Levothy112 showed TSH= 2.18... keep same. ~  labs 4/11 on Levo112 showed TSH= 0.51 ~  Labs 4/12 on Levo112 showed TSH= 1.02 ~  Labs 2/13 on Levo112 showed TSH= 20.8.Marland KitchenMarland Kitchen Why?  Pharm confirms regular refills, we will incr dose to 129mcg/d. ~  Labs 8/13 on Levo125 showed TSH= 20.2.Marland KitchenMarland Kitchen We decided to incr LEVOTHYROID 153mcg/d...  GERD (ICD-530.81) - takes NEXIUM 40mg  Prn (states Omep20 "makes me sick")...  ~  EGD was planned in 2006 eval but never done... ~  6/13:  She want to switch to ZANTAC 150mg /d since Nexium is too expensive  DIVERTICULOSIS OF COLON (ICD-562.10) - c/o constip & rec> Miralax + Senakot-S... ~  last colonoscopy 3/06 by DrDBrodie showed divertics only...  RENAL INSUFFICIENCY (ICD-588.9) - prev Creat in the 1.7 - 2.0 range... ~  labs 8/08 showed BUN=26, Creat=1.5 ~  labs 4/09 showed BUN= 34, Creat= 1.5 ~  labs 10/09 showed BUN= 31, Creat= 1.5 ~  labs 4/10 showed BUN= 28, Creat= 1.0, Microalb= pos... continue Rx (on Diovan). ~  labs 10/10 showed BUN= 22, Creat= 1.2 ~  labs 4/11 showed BUN= 20, Creat= 1.5 ~  labs 6/11 showed BUN= 20, Creat= 1.3 ~  labs 10/11 showed BUN= 27, Creat= 1.5 ~  Renal Ultrasound 5/12 was neg- NAD, no hydronephrosis... ~  Labs in [facility] 5/12 showed Creat= 2.11==>1.4 ~  Labs 5/12 post hosp showed BUN=44, Creat=1.7, K=5.7, HCO3=23;  She has appt w/ Nephrology set up by the Hospitalists. ~  6/12 & 10/12:  Pt seen by DrGoldsborough & her notes are reviewed... ~  Labs 2/13 showed BUN= 33, Creat= 1.6 ~  Labs 8/13 showed BUN= 34, Creat= 1.7  DEGENERATIVE JOINT DISEASE, GENERALIZED (ICD-715.00) - she uses Tylenol Prn. LOW BACK PAIN (ICD-724.2) OSTEOPOROSIS (ICD-733.00) - she takes calcium + vits daily... ~  BMD here 2/06 showed TScores -1.4 Spine,  -1.1 left FemNeck... ~  labs 4/09 showed Vit D level= 24... rec OTC Vit D 1000 u daily, but she stopped this. ~  labs 4/10 showed Vit D level = 20... rec> incr OTC Vit D 2000 u daily (she never did). ~  labs 4/11 showed Vit D level = 22...Marland KitchenMarland KitchenMarland Kitchen rec> incr to 2000 u daily. ~  Labs 4/12 showed Vit D level = 32  HEADACHE (ICD-784.0)  ANXIETY (ICD-300.00) - she notes her Bro has emphysema,  lung cancer and CHF... she takes ALPRAZOLAM 0.5mg  Prn.  ANEMIA>  Hx ACD & IronDefic>  Rx FeSO4 + VitC ~  Hx of multifactorial anemia w/ Hg=11 in 2005, Fe was OK, Stools neg for blood, Folate norm, B12 found low, & Creat 1.5+ ~  labs 1/08 showed Hg=11.1 ~  labs 8/08 showed Hg=9.9 ~  labs 4/09 showed Hg= 9.6, Fe=102, TIBC=344, Sat=21% ~  labs 10/09 showed Hg= 9.1, MCV= 92 ~  labs 4/10 showed = 11.1, Fe= 71 ~  labs 10/10 showed Hg= 9.3, MCV= 93... rec Fe Bid w/ Vit C... ~  labs 4/11 showed Hg= 11.8, MCV=87, Fe=86 ~  labs 10/11 showed Hg= 10.9, MCV= 89 ~  Labs in [facility] 5/12 showed Hg= 10.3==>8.6, Fe=19 (5%sat), B12=505, Folate=16, SPE= neg. ~  Labs 2/13 showed Hg= 12.0  VITAMIN B12 DEFICIENCY (ICD-266.2) - Vit B12 level was 77 in 2006 w/ pos parietal cell antibodies; she's been on B12 shots ever since then...  ~  f/u B12 level 4/10 = >  1500 ~  f/u B12 level 5/12= 505 ~  She remains on monthly Vit B12 shots...   Past Surgical History  Procedure Date  . Appendectomy     Outpatient Encounter Prescriptions as of 10/16/2011  Medication Sig Dispense Refill  . amLODipine (NORVASC) 5 MG tablet Take 1 tablet (5 mg total) by mouth daily.  30 tablet  6  . aspirin 81 MG tablet Take 81 mg by mouth daily.        Marland Kitchen atorvastatin (LIPITOR) 20 MG tablet TAKE 1 TABLET EVERY DAY  30 tablet  6  . Cholecalciferol (VITAMIN D3) 3000 UNITS TABS Take 2 capsules by mouth daily.       . fenofibrate 160 MG tablet Take 1 tablet (160 mg total) by mouth daily.  30 tablet  11  . ferrous sulfate 325 (65 FE) MG EC tablet Take 325 mg by mouth 2 (two) times daily.       . furosemide (LASIX) 20 MG tablet Take 1 tablet (20 mg total) by mouth daily.  30 tablet  11  . glucose blood (BAYER CONTOUR TEST) test strip 1 each by Other route 4 (four) times daily as needed for other. And lancets 2/dat  250.03  150 each  6  . GNP LANCETS MICRO THIN 33G MISC Use as directed  100 each  6  . insulin NPH (HUMULIN N PEN) 100 UNIT/ML  injection Inject 100 Units into the skin every morning.  30 mL  11  . Insulin Pen Needle 31G X 8 MM MISC Use as directed with the humulin pen insulin  50 each  6  . levothyroxine (SYNTHROID) 150 MCG tablet Take 1 tablet (150 mcg total) by mouth daily.  30 tablet  11  . ranitidine (ZANTAC) 150 MG capsule Take 1 capsule (150 mg total) by mouth every evening.  30 capsule  11  . triamcinolone cream (KENALOG) 0.1 % Apply topically 3 (three) times daily. As needed for itching  30 g  1  . cefadroxil (DURICEF) 500 MG capsule Take 1 capsule (500 mg total) by mouth 2 (two) times daily.  20 capsule  0  . DISCONTD: glucose blood (BAYER CONTOUR TEST) test strip 1 each by Other route 2 (two) times daily. And lancets 2/dat  250.03        Allergies  Allergen Reactions  . Codeine     REACTION: change in mental status  . Diphenhydramine Hcl     REACTION: itching  . Quinine     REACTION: abnormal sensations in face  . Sulfonamide Derivatives     REACTION: rash    Current Medications, Allergies, Past Medical History, Past Surgical History, Family History, and Social History were reviewed in [employer] record.    Review of Systems        See HPI - all other systems neg except as noted... The patient complains of nausea & diarrhea- now resolved.  The patient denies anorexia, fever, weight loss, weight gain, vision loss, decreased hearing, hoarseness, syncope, peripheral edema, prolonged cough, headaches, hemoptysis, abdominal pain, melena, hematochezia, severe indigestion/heartburn, hematuria, incontinence, suspicious skin lesions, transient blindness, depression, unusual weight change, abnormal bleeding, enlarged lymph nodes, and angioedema.     Objective:   Physical Exam      WD, WN, 78 y/o WF in NAD...  VS reviewed & BP= 120/64 w/o postural changes... GENERAL:  Alert & oriented; pleasant & cooperative... HEENT:  [place]/AT, EOM-wnl, PERRLA, EACs-clear, TMs- sl red on left,  NOSE-clear,  THROAT-clear & wnl. NECK:  Decr ROM; no JVD; normal carotid impulses w/o bruits; +thyroid scar, no thyromegaly or nodules palpated; no lymphadenopathy. CHEST:  Clear to P & A; without wheezes/ rales/ or rhonchi heard; +tender chest wall & pectoralis area. HEART:  Regular Rhythm; without murmurs/ rubs/ or gallops detected... ABDOMEN:  Soft & nontender; normal bowel sounds; no organomegaly or masses palpated... EXT: without deformities, mild arthritic changes; no varicose veins/ +venous insuffic/ no edema. NEURO:  CN's intact; no focal neuro deficits or weakness found... DERM:  no lesions noted...  RADIOLOGY DATA:  Reviewed in the EPIC EMR & discussed w/ the patient...   LABORATORY DATA:  Reviewed in the EPIC EMR & discussed w/ the patient...   Assessment & Plan:   Infected sebaceous cyst on back>>  We discussed Duricef 500mg  Bid x10d, Align daily, clean w/ mild soapy water several times/d, warm soaks, cover w/ neosporin, dry gause & paper tape; if not resolving it will need I&D...   AB>  No recent infectious exac; not on regular meds...  Hypertension>  Her BP remains controlled on Amlodipine monotherapy, & specifically her BP is not too low & there are no postural changes;  Continue same med...  Hx of CP>  She was seen 05/13/10 for a routine 52mo check up & daugh mentioned her atypCP & wanted cardiac eval & seen by Cherly Hensen;  Pt did not mention chest discomfort during this visit but she is 79y/o w/ mult cardiac risk factors & prev EKG w/ poor R progression & NSSTTWA...  Diabetes>  Now followed & managed by DrEllison on HUMULIN N at 100u daily;   Hyperlipidemia>  On Lipitor & Fenofibrate, needs better low fat diet, continue same meds...  Renal Insuffic>  Followed by DrGoldsborough & Creat is stable...  Hypothyroid>  Suddenly TSH was ~20 on 121mcg/d & this makes no sense at all; Pharm confirms filling Rx regularly therefore we incr dose to 194mcg/d...  Anemia>  Multifactorial,  on B12 shots monthly & Fe/ VitC supplementation...  Anxiety>  She stopped her prn Alprazolam...   Patient's Medications  New Prescriptions   CEFADROXIL (DURICEF) 500 MG CAPSULE    Take 1 capsule (500 mg total) by mouth 2 (two) times daily.  Previous Medications   AMLODIPINE (NORVASC) 5 MG TABLET    Take 1 tablet (5 mg total) by mouth daily.   ASPIRIN 81 MG TABLET    Take 81 mg by mouth daily.     ATORVASTATIN (LIPITOR) 20 MG TABLET    TAKE 1 TABLET EVERY DAY   CHOLECALCIFEROL (VITAMIN D3) 3000 UNITS TABS    Take 2 capsules by mouth daily.    FENOFIBRATE 160 MG TABLET    Take 1 tablet (160 mg total) by mouth daily.   FERROUS SULFATE 325 (65 FE) MG EC TABLET    Take 325 mg by mouth 2 (two) times daily.    FUROSEMIDE (LASIX) 20 MG TABLET    Take 1 tablet (20 mg total) by mouth daily.   GNP LANCETS MICRO THIN 33G MISC    Use as directed   INSULIN NPH (HUMULIN N PEN) 100 UNIT/ML INJECTION    Inject 100 Units into the skin every morning.   INSULIN PEN NEEDLE 31G X 8 MM MISC    Use as directed with the humulin pen insulin   LEVOTHYROXINE (SYNTHROID) 150 MCG TABLET    Take 1 tablet (150 mcg total) by mouth daily.   RANITIDINE (ZANTAC) 150 MG CAPSULE  Take 1 capsule (150 mg total) by mouth every evening.   TRIAMCINOLONE CREAM (KENALOG) 0.1 %    Apply topically 3 (three) times daily. As needed for itching  Modified Medications   Modified Medication Previous Medication   GLUCOSE BLOOD (BAYER CONTOUR TEST) TEST STRIP glucose blood (BAYER CONTOUR TEST) test strip      1 each by Other route 4 (four) times daily as needed for other. And lancets 2/dat  250.03    1 each by Other route 2 (two) times daily. And lancets 2/dat  250.03  Discontinued Medications   No medications on file

## 2011-10-16 NOTE — Telephone Encounter (Signed)
I spoke with pt son and is scheduled to come in at that time. Nothing further was needed

## 2011-10-16 NOTE — Telephone Encounter (Signed)
I spoke with daughter and she stated pt has a boil on her back size of "50 cent piece"-it's very red, swollen, and hot. The daughter barely pressure on it and "lots of puss starting gushing out". She has had this x 4 days now. The are requesting to see SN today bc it looks like it is getting worse. Please advise SN thanks

## 2011-10-22 ENCOUNTER — Encounter: Payer: Self-pay | Admitting: Endocrinology

## 2011-10-22 ENCOUNTER — Other Ambulatory Visit (INDEPENDENT_AMBULATORY_CARE_PROVIDER_SITE_OTHER): Payer: Medicare Other

## 2011-10-22 ENCOUNTER — Ambulatory Visit (INDEPENDENT_AMBULATORY_CARE_PROVIDER_SITE_OTHER): Payer: Medicare Other | Admitting: Endocrinology

## 2011-10-22 VITALS — BP 140/64 | HR 73 | Temp 97.8°F | Ht 66.0 in | Wt 172.0 lb

## 2011-10-22 DIAGNOSIS — E538 Deficiency of other specified B group vitamins: Secondary | ICD-10-CM

## 2011-10-22 DIAGNOSIS — E1029 Type 1 diabetes mellitus with other diabetic kidney complication: Secondary | ICD-10-CM

## 2011-10-22 DIAGNOSIS — N058 Unspecified nephritic syndrome with other morphologic changes: Secondary | ICD-10-CM

## 2011-10-22 DIAGNOSIS — E1065 Type 1 diabetes mellitus with hyperglycemia: Secondary | ICD-10-CM

## 2011-10-22 MED ORDER — CYANOCOBALAMIN 1000 MCG/ML IJ SOLN
1000.0000 ug | Freq: Once | INTRAMUSCULAR | Status: AC
  Administered 2011-10-22: 1000 ug via INTRAMUSCULAR

## 2011-10-22 NOTE — Progress Notes (Signed)
Subjective:    Patient ID: Jocelyn Mendez, female    DOB: 1930-03-30, 78 y.o.   MRN: [id number]  HPI Pt returns for f/u of insulin-requiring DM (dx'ed [id number]; complicated by renal insuff).  She has chosen qd NPH insulin, for simplicity). she brings a record of her cbg's which i have reviewed today.  Virtually all are in the low to mid-100's.  It is in general higher as the day goes on.   Past Medical History  Diagnosis Date  . Acute bronchitis   . Unspecified essential hypertension   . Chest pain, unspecified   . Type II or unspecified type diabetes mellitus without mention of complication, not stated as uncontrolled   . Other and unspecified hyperlipidemia   . Unspecified hypothyroidism   . Esophageal reflux   . Diverticulosis of colon (without mention of hemorrhage)   . Unspecified disorder resulting from impaired renal function   . Generalized osteoarthrosis, unspecified site   . Lumbago   . Osteoporosis   . Headache   . Anxiety state, unspecified   . Anemia of other chronic disease   . Other B-complex deficiencies     Past Surgical History  Procedure Date  . Appendectomy     History   Social History  . Marital Status: Divorced    Spouse Name: N/A    Number of Children: 6  . Years of Education: N/A   Occupational History  . Not on file.   Social History Main Topics  . Smoking status: Former Smoker -- 0.5 packs/day for 5 years    Types: Cigarettes    Quit date: 02/10/1973  . Smokeless tobacco: Former Systems developer    Types: Snuff    Quit date: 04/07/2010  . Alcohol Use: No  . Drug Use: No  . Sexually Active: Not on file   Other Topics Concern  . Not on file   Social History Narrative   1 child passed away at 8 weeks old---?crib death    Current Outpatient Prescriptions on File Prior to Visit  Medication Sig Dispense Refill  . amLODipine (NORVASC) 5 MG tablet Take 1 tablet (5 mg total) by mouth daily.  30 tablet  6  . aspirin 81 MG tablet Take 81 mg by mouth  daily.        Marland Kitchen atorvastatin (LIPITOR) 20 MG tablet TAKE 1 TABLET EVERY DAY  30 tablet  6  . cefadroxil (DURICEF) 500 MG capsule Take 1 capsule (500 mg total) by mouth 2 (two) times daily.  20 capsule  0  . Cholecalciferol (VITAMIN D3) 3000 UNITS TABS Take 2 capsules by mouth daily.       . fenofibrate 160 MG tablet Take 1 tablet (160 mg total) by mouth daily.  30 tablet  11  . ferrous sulfate 325 (65 FE) MG EC tablet Take 325 mg by mouth 2 (two) times daily.       . furosemide (LASIX) 20 MG tablet Take 1 tablet (20 mg total) by mouth daily.  30 tablet  11  . glucose blood (BAYER CONTOUR TEST) test strip 1 each by Other route 4 (four) times daily as needed for other. And lancets 2/dat  250.03  150 each  6  . GNP LANCETS MICRO THIN 33G MISC Use as directed  100 each  6  . Insulin Pen Needle 31G X 8 MM MISC Use as directed with the humulin pen insulin  50 each  6  . levothyroxine (SYNTHROID) 150 MCG tablet Take 1  tablet (150 mcg total) by mouth daily.  30 tablet  11  . ranitidine (ZANTAC) 150 MG capsule Take 1 capsule (150 mg total) by mouth every evening.  30 capsule  11  . triamcinolone cream (KENALOG) 0.1 % Apply topically 3 (three) times daily. As needed for itching  30 g  1  . insulin NPH (HUMULIN N PEN) 100 UNIT/ML injection Inject 110 Units into the skin every morning.  33 mL  5  . DISCONTD: insulin glargine (LANTUS SOLOSTAR) 100 UNIT/ML injection Inject 100 Units into the skin every morning.  45 mL  PRN    Allergies  Allergen Reactions  . Codeine     REACTION: change in mental status  . Diphenhydramine Hcl     REACTION: itching  . Quinine     REACTION: abnormal sensations in face  . Sulfonamide Derivatives     REACTION: rash    Family History  Problem Relation Age of Onset  . Diabetes Mother     BP 140/64  Pulse 73  Temp 97.8 F (36.6 C) (Oral)  Ht 5\' 6"  (1.676 m)  Wt 172 lb (78.019 kg)  BMI 27.76 kg/m2  SpO2 94%    Review of Systems denies hypoglycemia      Objective:   Physical Exam VITAL SIGNS:  See vs page GENERAL: no distress SKIN:  Insulin injection sites at the anterior abdomen are normal   Lab Results  Component Value Date   HGBA1C 8.9* 10/22/2011      Assessment & Plan:  DM: needs increased rx

## 2011-10-22 NOTE — Patient Instructions (Addendum)
blood tests are being requested for you today.  You will receive a letter with results. check your blood sugar 2 times a day.  vary the time of day when you check, between before the 3 meals, and at bedtime.  also check if you have symptoms of your blood sugar being too high or too low.  please keep a record of the readings and bring it to your next appointment here.  please call us sooner if your blood sugar goes below 70, or if it stays over 200.   Continue NPH insulin, 90 units each morning.   Please come back for a follow-up appointment in 4 months.

## 2011-10-23 ENCOUNTER — Telehealth: Payer: Self-pay | Admitting: *Deleted

## 2011-10-23 MED ORDER — INSULIN NPH (HUMAN) (ISOPHANE) 100 UNIT/ML ~~LOC~~ SUSP: 110.0000 [IU] | Freq: Every morning | SUBCUTANEOUS | Status: DC

## 2011-10-23 NOTE — Telephone Encounter (Signed)
Increase to 110 qam

## 2011-10-23 NOTE — Telephone Encounter (Signed)
Called pt to inform of lab results, pt's daughter informed (letter also mailed to pt). Pt's daughter states that she is already taking 100 units every morning. Please advise on insulin adjustment.

## 2011-10-23 NOTE — Telephone Encounter (Signed)
Pt's daughter informed of insulin adjustment, new rx sent to [facility].

## 2011-11-19 ENCOUNTER — Telehealth: Payer: Self-pay | Admitting: Pulmonary Disease

## 2011-11-19 MED ORDER — "NEEDLE (DISP) 25G X 5/8"" MISC": Status: DC

## 2011-11-19 NOTE — Telephone Encounter (Signed)
rx for the needles  For the b12 have been sent to the pharmacy per pts request. Called and spoke with [facility] and she is aware that this rx has been sent.

## 2011-11-28 ENCOUNTER — Other Ambulatory Visit: Payer: Self-pay | Admitting: Pulmonary Disease

## 2011-12-03 ENCOUNTER — Telehealth: Payer: Self-pay | Admitting: Pulmonary Disease

## 2011-12-03 MED ORDER — AMOXICILLIN-POT CLAVULANATE 875-125 MG PO TABS: 1.0000 | ORAL_TABLET | Freq: Two times a day (BID) | ORAL | Status: DC

## 2011-12-03 NOTE — Telephone Encounter (Signed)
I spoke with pt and is aware of SN recs. She voiced her understanding. i have sent RX into the pharmacy.

## 2011-12-03 NOTE — Telephone Encounter (Signed)
I spoke with pt and she c/o cough w/ yellow phlem, HA, nasal congestion x yesterday. No f/c/s/n/v/wheezing/chest tx/SOB. She has been taking tylenol and robitussin. She is requesting to have something else called in for her. Please advise SN thanks  Allergies  Allergen Reactions  . Codeine     REACTION: change in mental status  . Diphenhydramine Hcl     REACTION: itching  . Quinine     REACTION: abnormal sensations in face  . Sulfonamide Derivatives     REACTION: rash

## 2011-12-03 NOTE — Telephone Encounter (Signed)
Per SN--call in augmentin 875 mg  #14  1 po bid, mucinex 600 mg  2 po bid with plenty of fluids. thanks

## 2011-12-09 ENCOUNTER — Telehealth: Payer: Self-pay | Admitting: Pulmonary Disease

## 2011-12-09 MED ORDER — PREDNISONE (PAK) 5 MG PO TABS: 5.0000 mg | ORAL_TABLET | ORAL | Status: DC

## 2011-12-09 NOTE — Telephone Encounter (Signed)
Per SN--- take mucinex 600 mg  2 po bid with lots of fluids, prednisone dosepack 5 mg  6 day pack

## 2011-12-09 NOTE — Telephone Encounter (Signed)
Jocelyn Mendez is aware of Dr. Jeannine Kitten recommendations. Rx has been sent to pt pharmacy.

## 2012-01-16 ENCOUNTER — Encounter: Payer: Self-pay | Admitting: *Deleted

## 2012-01-17 ENCOUNTER — Other Ambulatory Visit: Payer: Self-pay | Admitting: Pulmonary Disease

## 2012-01-19 ENCOUNTER — Encounter: Payer: Self-pay | Admitting: Pulmonary Disease

## 2012-01-19 ENCOUNTER — Telehealth: Payer: Self-pay | Admitting: Pulmonary Disease

## 2012-01-19 ENCOUNTER — Other Ambulatory Visit (INDEPENDENT_AMBULATORY_CARE_PROVIDER_SITE_OTHER): Payer: Medicare Other

## 2012-01-19 ENCOUNTER — Ambulatory Visit (INDEPENDENT_AMBULATORY_CARE_PROVIDER_SITE_OTHER): Payer: Medicare Other | Admitting: Pulmonary Disease

## 2012-01-19 ENCOUNTER — Ambulatory Visit (INDEPENDENT_AMBULATORY_CARE_PROVIDER_SITE_OTHER): Payer: Medicare Other

## 2012-01-19 VITALS — BP 142/70 | HR 83 | Temp 98.1°F | Ht 66.0 in | Wt 172.0 lb

## 2012-01-19 DIAGNOSIS — K219 Gastro-esophageal reflux disease without esophagitis: Secondary | ICD-10-CM

## 2012-01-19 DIAGNOSIS — M159 Polyosteoarthritis, unspecified: Secondary | ICD-10-CM

## 2012-01-19 DIAGNOSIS — E785 Hyperlipidemia, unspecified: Secondary | ICD-10-CM

## 2012-01-19 DIAGNOSIS — D638 Anemia in other chronic diseases classified elsewhere: Secondary | ICD-10-CM

## 2012-01-19 DIAGNOSIS — E1065 Type 1 diabetes mellitus with hyperglycemia: Secondary | ICD-10-CM

## 2012-01-19 DIAGNOSIS — E1029 Type 1 diabetes mellitus with other diabetic kidney complication: Secondary | ICD-10-CM

## 2012-01-19 DIAGNOSIS — K5730 Diverticulosis of large intestine without perforation or abscess without bleeding: Secondary | ICD-10-CM

## 2012-01-19 DIAGNOSIS — E039 Hypothyroidism, unspecified: Secondary | ICD-10-CM

## 2012-01-19 DIAGNOSIS — N259 Disorder resulting from impaired renal tubular function, unspecified: Secondary | ICD-10-CM

## 2012-01-19 DIAGNOSIS — E538 Deficiency of other specified B group vitamins: Secondary | ICD-10-CM

## 2012-01-19 DIAGNOSIS — F411 Generalized anxiety disorder: Secondary | ICD-10-CM

## 2012-01-19 DIAGNOSIS — I10 Essential (primary) hypertension: Secondary | ICD-10-CM

## 2012-01-19 DIAGNOSIS — M810 Age-related osteoporosis without current pathological fracture: Secondary | ICD-10-CM

## 2012-01-19 DIAGNOSIS — M545 Low back pain: Secondary | ICD-10-CM

## 2012-01-19 DIAGNOSIS — D649 Anemia, unspecified: Secondary | ICD-10-CM

## 2012-01-19 LAB — LDL CHOLESTEROL, DIRECT: Direct LDL: 107.5 mg/dL

## 2012-01-19 LAB — LIPID PANEL
HDL: 24.4 mg/dL — ABNORMAL LOW (ref >39.00)
Total CHOL/HDL Ratio: 7
Triglycerides: 262 mg/dL — ABNORMAL HIGH (ref 0.0–149.0)
VLDL: 52.4 mg/dL — ABNORMAL HIGH (ref 0.0–40.0)

## 2012-01-19 LAB — BASIC METABOLIC PANEL
Calcium: 9.6 mg/dL (ref 8.4–10.5)
GFR: 30.81 mL/min — ABNORMAL LOW (ref >60.00)
Potassium: 4.4 mEq/L (ref 3.5–5.1)
Sodium: 134 mEq/L — ABNORMAL LOW (ref 135–145)

## 2012-01-19 LAB — HEMOGLOBIN A1C: Hgb A1c MFr Bld: 8.8 % — ABNORMAL HIGH (ref 4.6–6.5)

## 2012-01-19 LAB — TSH: TSH: 9.75 u[IU]/mL — ABNORMAL HIGH (ref 0.35–5.50)

## 2012-01-19 NOTE — Telephone Encounter (Signed)
Called, spoke with pt's daughter, Estill Bamberg.  She was aware of pending handicap placard.  States pt nor son is at home right now but requesting the placard be left at front for pick up.  Advised this was signed by SN and placed at front -- Estill Bamberg verbalized understanding and will inform pt and pt's son.  Nothing further needed at this time.

## 2012-01-19 NOTE — Telephone Encounter (Signed)
Dr. Lenna Gilford, pt's son was asking for a handicap placard to be filled out for pt.  He is requesting we call him when this is available.  I have placed this on cart along with msg.  Pls advise if this is ok.  Thank you.

## 2012-01-19 NOTE — Progress Notes (Signed)
Subjective:    Patient ID: Jocelyn Mendez, female    DOB: 03/14/1930, 78 y.o.   MRN: [id number]  HPI 78 y/o WF here for a follow up visit... she has mult med problems including:  HBP, Hypercholesterolemia, DM, mild renal insuffic, & multifactorial anemia w/ B12 defic on shots monthly... she also has DJD, LBP, Osteopenia, etc...  << see prev notes for earlier entries >>  ~  Jul 04, 2010:  s/p Hosp by [facility] in April & May for dehydration, renal insuffic, etc; then UTI (sens EColi), weakness, DM, ?type 4 RTA> see DCSummary...  Told she has kidney disease & they have appt w/ Nephrology pending- set up by the hospitalists; they also want appt w/ diabetic specialist- we will arrange this per their request...  ~  August 12, 2010:  31mo ROV & review> She will now be receiving her care by committee:  Seen by Nephrology recently (note pending) & she tells me that "they stopped the Metformin & raised the little blue pill to 1&1/2 daily";  She has appt w/ DrKerr for DM/Endocrine July 24 & her home BS checks have all been in the 200's (currently on [place]);  BP remains controlled on meds as below;  Lipids fair on Lip20 & Fenofib160 but needs better low fat diet & incr exerc;  Thyroid stable on 171mcg/d;  See prob list>>  ~  November 12, 2010:  37mo ROV> she requested add-on appt for "cold" w/ cough, yellow sput, chest congestion; ?low grade fever w/o chills or sweats; Augmentin called in for pt & she is improved, also rec to take Mucinex 2Bid + Fluids po...  BP controlled on Amlodipine;  DM under treatment from [place] on [place] but A1c still out of control;  Lipids regulated by [place]- needs ret for FLP;  Thyroid stable on Synthroid112;  Other problems as noted below...  ~  March 17, 2011:  9mo ROV & she notes doing better, no new complaints or concerns; followed by DrKerr every 26mo & DrGoldsborough every 6 months>    HBP> on Norvasc5; BP= 130/70 & stable; denies CP, palpit, ch in dyspnea or  edema...    DM> on LEVEMIR (now 46uBid) & Onglyza 2.5mg /d; followed by DrKerr & his note of 10/12 is reviewed- A1c= 9.0 then while on 80u Levemir/d... LABS TODAY show FBS=229, A1c=11.0, and copies sent to Endocrine.    Hyperlipid> on Lip20 + Fenofib160; needs to ret fasting for f/u FLP ==> TChol 147, TG 165, HDL 30, LDL 85; rec better low fat diet & incr exercise...    Hypothyroid> on Synthroid 160mcg/d; TSH had been controlled on this dose but f/u TSH= 20.8 & pharm confirms regular refills! This really doesn't make much sense but we will incr Synthroid to 120mcg/d dose...     GI> GERD, Divertics> on Nexium prn + Miralax & Senakot-S...    Renal Insuffic> followed by Nephrology [place] & her note of 10/12 is reviewed & Creat was 1.8.Marland KitchenMarland Kitchen    DJD, LBP, Osteopenia> she knows to avoid NSAIDs & uses Tylenol as needed + Vit D supplement...    Anxiety> she is not currently on an anxiolytic agent even though I have rec low dose Alpraz in the past...    Anemia & B12 Defic> on Fe orally & B12 shots monthly (she notes her Pharm was out of parenteral B12 recently); Hg stable in the 10-12 range...  ~  August 05, 2011:  4-63mo ROV and [place] called  for an add-on visit due to an upper resp infection w/ cough, yellow-green sputum, low grade fever & aching all over; taking Robitssin & Tylenol but no better; CXR today shows some peribronchial thickening but no infiltrate & exam shows scat rhonchi but no consolidation; we decided to treat w/ Levaquin, Mucinex, Fluids,  & Tussionex for her cough w/ CP...    She has had 4 f/u visits w/ DrEllison since I last saw her> her A1c is improved down to 8.9 recently; she is on HumulinN 100uQd; pt & daugh likes DrEllison...    She wants to stop the Nexium (too expensive) & switch to Zantac; I suggested Prilosec ($20 for 90 pills at [facility]) but they want the Zantac...    She saw Nephrology, Guinevere Scarlet 4/13> Stage III CKD, Creat in the 1.5-2.0 range, GFR in the 30s, BP &  vol status were good=> given LASIX 20mg  to use prn swelling.    We reviewed prob list, meds, xrays and labs> see below>> CXR 6/13 showed norm heart size, min streaky atx at bases w/ sl peribronchial thickening c/w bronchitis, otherw clear, NAD.Marland KitchenMarland Kitchen  ~  September 15, 2011:  6wk [place] is doing reasonably well overall> she is requesting a new DM meter & we wrote perscription for this to get at her Pharm;  On Humulin N 100u daily & she notes BS "all over the place" & blames her meter- we reviewed diet, exercise, proper insulin injection technique etc;  BP controlled on Norvasc & doesn't use the Lasix except maybe once or twice per month;  Due for f/u labs today & tells me she's taking everything regularly (including the Levothy125);  She has some mod CWP & tender- discussed rest, heat, offered Tramadol but she prefers to stick w/ Tylenol alone...    We reviewed prob list, meds, xrays and labs> see below for updates >> LABS 8/13:  FLP- chol ok but TG=232 HDL=30 on Lip20+Feno160;  [place]- BS=89 A1c=8.8 on NPH100u/d per DrEllison, & BUN=34 Creat=1.7 (stable);  TSH=20 on Levothy125...  ~  October 16, 2011:  76mo ROV & urgent add-on appt for infected cyst on her back> daugh called today w/ raised red tender knot on upper right back; exam shows a mod size sebaceous cyst, tender to palpation w/ sm amt beige pus draining; no other lesion apparent, no f/c/s, VS are stable & we discussed Rx w/ warm compresses, apply neosporin, clean gauze, etc; start Rx w/ Duricef 500mg  Bid x10d & take Align...  Reminded to drink plenty of water, stay well hydrated & check BS regularly;  She requests 2013 Flu vaccine today- ok...  ~  January 19, 2012:  42mo ROV & prev infected cyst has resolved; here for f/u visit & she reports doing fine- no new complaints or concerns...  We reviewed the following medical problems during today's office visit>>     HxAB> she gets freq exac & requires antibiotics, etc...    HBP> on Amlod5, Lasix20mg  prn  swelling; BP= 142/70 & she denies CP, palpit, ch in SOB, edema, etc...    HxCP> on ASA81 & she needs to be more active, incr exercise program...    CHOL> on Lip20 & Feno160; last FLP 8/13 showed TChol 159, TG 232, HDL 30, LDL 99    DM> on NovolinN per DrEllison- currently 110u Qam; last labs 8/13 showed BS=89 & A1c=8.8    Hypothy> on Synthroid150; 8/13 TSH=20.2; reminded to take it everyday first thing in the AM on empty stomach.Marland KitchenMarland Kitchen  GI- GERD, Divertics> on Zantac150; she denies abd pain, dysphagia, n/v, d/c, blood seen...    Renal Insiuffic> she has seen [place] for Nephrology; stable off nephrotoxic meds; Creat~1.6-1.7 range...    DJD, LBP, Osteopenia> on calcium, MVI, Vit D supplement; uses Tylenol prn pain; notes discomfort in hands etc...    Anxiety> on Alpraz0.25 as needed...    Anemia, B12 Defic> on Fe325mg /d & B12 injection monthly We reviewed prob list, meds, xrays and labs> see below for updates >>           Problem List:  Hx of ASTHMATIC BRONCHITIS, ACUTE (ICD-466.0) - no recent bronchitic episodes... the increased activity has really helped her... ~  CXR 5/12 showed clear lungs x mild scarring left base & apical pleural thickening... ~  CXR 6/13 showed norm heart size, min streaky atx at basesw/ sl peribronchial thickening c/w bronchitis, otherw clear, NAD...  HYPERTENSION (ICD-401.9) - prev on Diovan/HCT but this was stopped 4/12 Hosp & now on NORVASC 5mg /d... ~  10/12:  BP= 128/62 & she denies CP, palpit, syncope, SOB, edema... ~  2/13:  BP= 130/70 & she feels well & denies symptoms... ~  4/13:  DrGoldsborough started LASIX 20mg /d as needed for swelling... ~  6/13:  BP= 120/60 & she notes some CP from her cough, but no palpit/ ch in SOB, etc... ~  8/13:  BP= 130/60 & although she c/o left CWP, she denies palpit, ch in SOB, recent edema, etc... ~  12/13: on Amlod5, Lasix20mg  prn swelling; BP= 142/70 & she denies CP, palpit, ch in SOB, edema, etc...  Hx of CHEST PAIN  (ICD-786.50) - on ASA 81mg /d;  she continues to experience intermittent sharp left CWP- Rx w/ Tylenol Prn. ~  cath 1988 showed normal coronaries, min MVP... ~  Cards eval 4/12 by DrNishan> mult risk factors, atypCP, EKG w/ NSSTTWA, planned Myoview but not done due to subseq hosp 4/12... ~  8/13:  She is c/o left CWP- sharp, tender in left axillary area, etc; discussed rest, heat, Tylenol (she does not want stronger pain med).  DM (ICD-250.00) - followed by DrEllison> DM w/ Nephropathy & Neuropathy> on HUMULIN N 100u daily; (off Metformin rx since 4/12 hosp)... ~  labs 8/08 showed BS= 113, HgA1c=5.4.Marland KitchenMarland Kitchen On Metform + Glimep. ~  labs 4/09 showed BS= 99, HgA1c= 5.5.Marland KitchenMarland Kitchen Stopped Glimep. ~  labs 4/10 showed BS= 125, A1c= 7.1.Marland Kitchen. rec> better diet, same meds for now. ~  labs 4/11 showed BS= 266, A1c= 9.1.Marland KitchenMarland Kitchen what happened? Add Glimep1mg /d back. ~  labs 6/11 showed BS= 195, A1c= 7.9.Marland KitchenMarland Kitchen incr Glimep to 2mg /d. ~  labs 10/11 showed BS= 175, A1c= 7.9.Marland KitchenMarland Kitchen rec incr Gimep to 4mg /d. ~  Labs in [facility] 5/12 showed BS= 232==>139; A1c=9.7; Metform stopped in hosp, on [place]. ~  Labs 5/12 post hosp showed BS= 255, A1c= 8.6.Marland KitchenMarland Kitchen Referred to Endocrinology. ~  7/12:  BS at home all in the 200s, she doesn't want additional meds, wants to wait for appt w/ DrKerr 7/24. ~  8/12:  She was seen by DrKerr & her started her on Levemir insulin + Onglyza 2.5mg /d ==> his notes & labs are reviewed... ~  10/12: Note from DrKerr> pt on Levemir 80u + Onglyza 2.5mg /d w/ A1c= 9.0.Marland Kitchen. ~  Labs here 2/13 showed FBS= 229, A1c= 11.0 & we will fax to DrKerr for his review... ~  Pt switched to DrEllison in the interval> meds changed to HUMULIN N & now on 100u daily w/ recent A1c=8.9.Marland KitchenMarland Kitchen ~  Labs 8/13 on HumulinN100/d showed BS= 89, A1c= 8.8... We reviewed diet, exercise, & f/u w/ DrEllison.  HYPERLIPIDEMIA (ICD-272.4) - on LIPITOR 20mg /d & FENOFIBRATE 160mg /d (ch from Lopid 10/10). ~  FLP 8/08 showed TChol 129, TG 197, HDL 17, LDL 73... despite  taking statin & fibrate drugs. ~  FLP 4/09 on Lip20+Tric145 showed TChol 127, TG 269, HDL 13, LDL 69... rec> ch Tricor to [phone]. ~  [place] 10/09 on Lip20+Trilip135 showed TChol 124, TG 247, HDL 10!, LDL 64... rec> ch Trilip to LopidBid. ~  FLP 4/10 on Lip20+LopidBid showed TChol 174, TG 239, HDL 38, LDL 103... rec> ch Lopid to Feno160 (she never did). ~  FLP 10/10 showed TChol 134, TG 256, HDL 10, LDL 76... change the Lopid to Fenofib160/d. ~  FLP 4/11 on Lip20+Feno160 showed TChol 163, TG 234, HDL 28, LDL 108... keep same, better diet! ~  FLP 10/11 on Lip20+Feno160 showed TChol 172, Tg 304, HDL 26, LDL 112... take meds regularly & better diet. ~  FLP 4/12 on Lip20+Feno160 showed TChol 160, TG 258, HDL 19, LDL 100... Needs better low fat diet. ~  FLP 2/13 on Lip20+Feno160 showed TChol 147, TG 165, HDL 30, LDL 85 ~  FLP 8/13 on Lip20+Feno160 showed TChol 159, TG 232, HDL 30, LDL 99  HYPOTHYROIDISM (ICD-244.9) - Hx remote Thyroid surg & scar in neck> on SYNTHROID 177mcg/d... ~  last TSH 1/08 = 0.41 ~  labs 4/09 showed TSH= 2.35 ~  labs 10/09 on Levothy125 showed TSH= 0.97 ~  labs 4/10 on Levothy125 showed TSH= 0.08... rec> decr [phone]. ~  labs 10/10 on Levothy112 showed TSH= 2.18... keep same. ~  labs 4/11 on Levo112 showed TSH= 0.51 ~  Labs 4/12 on Levo112 showed TSH= 1.02 ~  Labs 2/13 on Levo112 showed TSH= 20.8.Marland KitchenMarland Kitchen Why? Pharm confirms regular refills, we will incr dose to 167mcg/d. ~  Labs 8/13 on Levo125 showed TSH= 20.2.Marland KitchenMarland Kitchen We decided to incr LEVOTHYROID 13mcg/d...  GERD (ICD-530.81) - takes [place] 40mg  Prn (states Omep20 "makes me sick")...  ~  EGD was planned in 2006 eval but never done... ~  6/13:  She want to switch to ZANTAC 150mg /d since Nexium is too expensive  DIVERTICULOSIS OF COLON (ICD-562.10) - c/o constip & rec> Miralax + Senakot-S... ~  last colonoscopy 3/06 by DrDBrodie showed divertics only...  RENAL INSUFFICIENCY (ICD-588.9) - prev Creat in the 1.7 - 2.0  range... ~  labs 8/08 showed BUN=26, Creat=1.5 ~  labs 4/09 showed BUN= 34, Creat= 1.5 ~  labs 10/09 showed BUN= 31, Creat= 1.5 ~  labs 4/10 showed BUN= 28, Creat= 1.0, Microalb= pos... continue Rx (on Diovan). ~  labs 10/10 showed BUN= 22, Creat= 1.2 ~  labs 4/11 showed BUN= 20, Creat= 1.5 ~  labs 6/11 showed BUN= 20, Creat= 1.3 ~  labs 10/11 showed BUN= 27, Creat= 1.5 ~  Renal Ultrasound 5/12 was neg- NAD, no hydronephrosis... ~  Labs in [facility] 5/12 showed Creat= 2.11==>1.4 ~  Labs 5/12 post hosp showed BUN=44, Creat=1.7, K=5.7, HCO3=23;  She has appt w/ Nephrology set up by the Hospitalists. ~  6/12 & 10/12:  Pt seen by DrGoldsborough & her notes are reviewed... ~  Labs 2/13 showed BUN= 33, Creat= 1.6 ~  Labs 8/13 showed BUN= 34, Creat= 1.7  DEGENERATIVE JOINT DISEASE, GENERALIZED (ICD-715.00) - she uses Tylenol Prn. LOW BACK PAIN (ICD-724.2) OSTEOPOROSIS (ICD-733.00) - she takes calcium + vits daily... ~  BMD here 2/06 showed TScores -1.4 Spine,  -1.1 left  FemNeck... ~  labs 4/09 showed Vit D level= 24... rec OTC Vit D 1000 u daily, but she stopped this. ~  labs 4/10 showed Vit D level = 20... rec> incr OTC Vit D 2000 u daily (she never did). ~  labs 4/11 showed Vit D level = 22...Marland KitchenMarland KitchenMarland Kitchen rec> incr to 2000 u daily. ~  Labs 4/12 showed Vit D level = 32  HEADACHE (ICD-784.0)  ANXIETY (ICD-300.00) - she notes her Bro has emphysema, lung cancer and CHF... she takes ALPRAZOLAM 0.5mg  Prn.  ANEMIA>  Hx ACD & IronDefic>  Rx FeSO4 + VitC ~  Hx of multifactorial anemia w/ Hg=11 in 2005, Fe was OK, Stools neg for blood, Folate norm, B12 found low, & Creat 1.5+ ~  labs 1/08 showed Hg=11.1 ~  labs 8/08 showed Hg=9.9 ~  labs 4/09 showed Hg= 9.6, Fe=102, TIBC=344, Sat=21% ~  labs 10/09 showed Hg= 9.1, MCV= 92 ~  labs 4/10 showed = 11.1, Fe= 71 ~  labs 10/10 showed Hg= 9.3, MCV= 93... rec Fe Bid w/ Vit C... ~  labs 4/11 showed Hg= 11.8, MCV=87, Fe=86 ~  labs 10/11 showed Hg= 10.9, MCV= 89 ~   Labs in [facility] 5/12 showed Hg= 10.3==>8.6, Fe=19 (5%sat), B12=505, Folate=16, SPE= neg. ~  Labs 2/13 showed Hg= 12.0  VITAMIN B12 DEFICIENCY (ICD-266.2) - Vit B12 level was 77 in 2006 w/ pos parietal cell antibodies; she's been on B12 shots ever since then...  ~  f/u B12 level 4/10 = >1500 ~  f/u B12 level 5/12= 505 ~  She remains on monthly Vit B12 shots...   Past Surgical History  Procedure Date  . Appendectomy     Outpatient Encounter Prescriptions as of 01/19/2012  Medication Sig Dispense Refill  . amLODipine (NORVASC) 5 MG tablet Take 1 tablet (5 mg total) by mouth daily.  30 tablet  6  . aspirin 81 MG tablet Take 81 mg by mouth daily.        Marland Kitchen atorvastatin (LIPITOR) 20 MG tablet TAKE 1 TABLET EVERY DAY  30 tablet  6  . Cholecalciferol (VITAMIN D3) 3000 UNITS TABS Take 2 capsules by mouth daily.       . cyanocobalamin (,VITAMIN B-12,) 1000 MCG/ML injection INJECT 1 ML (1,000 MCG TOTAL) INTO THE MUSCLE ONCE.  1 mL  0  . fenofibrate 160 MG tablet Take 1 tablet (160 mg total) by mouth daily.  30 tablet  11  . ferrous sulfate 325 (65 FE) MG EC tablet Take 325 mg by mouth 2 (two) times daily.       . furosemide (LASIX) 20 MG tablet Take 1 tablet (20 mg total) by mouth daily.  30 tablet  11  . glucose blood (BAYER CONTOUR TEST) test strip 1 each by Other route 4 (four) times daily as needed for other. And lancets 2/dat  250.03  150 each  6  . GNP LANCETS MICRO THIN 33G MISC Use as directed  100 each  6  . insulin NPH (HUMULIN N PEN) 100 UNIT/ML injection Inject 110 Units into the skin every morning.  33 mL  5  . Insulin Pen Needle 31G X 8 MM MISC Use as directed with the humulin pen insulin  50 each  6  . levothyroxine (SYNTHROID) 150 MCG tablet Take 1 tablet (150 mcg total) by mouth daily.  30 tablet  11  . NEEDLE, DISP, 25 G (BD DISP NEEDLES) 25G X 5/8" MISC Use as directed to administer  the b12 injection  monthly  25 each  0  . ranitidine (ZANTAC) 150 MG capsule Take 1 capsule (150 mg  total) by mouth every evening.  30 capsule  11  . triamcinolone cream (KENALOG) 0.1 % Apply topically 3 (three) times daily. As needed for itching  30 g  1  . [DISCONTINUED] amoxicillin-clavulanate (AUGMENTIN) 875-125 MG per tablet Take 1 tablet by mouth 2 (two) times daily.  14 tablet  0  . [DISCONTINUED] predniSONE (STERAPRED UNI-PAK) 5 MG TABS Take 1 tablet (5 mg total) by mouth as directed.  21 each  0    Allergies  Allergen Reactions  . Codeine     REACTION: change in mental status  . Diphenhydramine Hcl     REACTION: itching  . Quinine     REACTION: abnormal sensations in face  . Sulfonamide Derivatives     REACTION: rash    Current Medications, Allergies, Past Medical History, Past Surgical History, Family History, and Social History were reviewed in [employer] record.    Review of Systems        See HPI - all other systems neg except as noted... The patient complains of nausea & diarrhea- now resolved.  The patient denies anorexia, fever, weight loss, weight gain, vision loss, decreased hearing, hoarseness, syncope, peripheral edema, prolonged cough, headaches, hemoptysis, abdominal pain, melena, hematochezia, severe indigestion/heartburn, hematuria, incontinence, suspicious skin lesions, transient blindness, depression, unusual weight change, abnormal bleeding, enlarged lymph nodes, and angioedema.     Objective:   Physical Exam      WD, WN, 78 y/o WF in NAD...  VS reviewed & BP= 120/64 w/o postural changes... GENERAL:  Alert & oriented; pleasant & cooperative... HEENT:  [place]/AT, EOM-wnl, PERRLA, EACs-clear, TMs- sl red on left, NOSE-clear, THROAT-clear & wnl. NECK:  Decr ROM; no JVD; normal carotid impulses w/o bruits; +thyroid scar, no thyromegaly or nodules palpated; no lymphadenopathy. CHEST:  Clear to P & A; without wheezes/ rales/ or rhonchi heard; +tender chest wall & pectoralis area. HEART:  Regular Rhythm; without murmurs/ rubs/ or gallops  detected... ABDOMEN:  Soft & nontender; normal bowel sounds; no organomegaly or masses palpated... EXT: without deformities, mild arthritic changes; no varicose veins/ +venous insuffic/ no edema. NEURO:  CN's intact; no focal neuro deficits or weakness found... DERM:  no lesions noted...  RADIOLOGY DATA:  Reviewed in the EPIC EMR & discussed w/ the patient...   LABORATORY DATA:  Reviewed in the EPIC EMR & discussed w/ the patient...   Assessment & Plan:    AB>  No recent infectious exac; not on regular meds...  Hypertension>  Her BP remains controlled on Amlodipine monotherapy, & specifically her BP is not too low & there are no postural changes;  Continue same med...  Hx of CP>  She was seen 05/13/10 for a routine 9mo check up & daugh mentioned her atypCP & wanted cardiac eval & seen by Cherly Hensen;  Pt did not mention chest discomfort during this visit but she is 79y/o w/ mult cardiac risk factors & prev EKG w/ poor R progression & NSSTTWA...  Diabetes>  Now followed & managed by DrEllison on HUMULIN N at 100u daily;   Hyperlipidemia>  On Lipitor & Fenofibrate, needs better low fat diet, continue same meds...  Renal Insuffic>  Followed by DrGoldsborough & Creat is stable...  Hypothyroid>  Suddenly TSH was ~20 on 154mcg/d & this makes no sense at all; Pharm confirms filling Rx regularly therefore we incr dose to 128mcg/d...  Anemia>  Multifactorial, on B12 shots monthly & Fe/ VitC supplementation...  Anxiety>  She stopped her prn Alprazolam...  Infected sebaceous cyst on back> resolved...   Patient's Medications  New Prescriptions   NEXIUM 40 MG CAPSULE    TAKE ONE CAPSULE EVERY DAY AS NEEDED FOR STOMACH ACID  Previous Medications   ASPIRIN 81 MG TABLET    Take 81 mg by mouth daily.     ATORVASTATIN (LIPITOR) 20 MG TABLET    TAKE 1 TABLET EVERY DAY   CHOLECALCIFEROL (VITAMIN D3) 3000 UNITS TABS    Take 2 capsules by mouth daily.    CYANOCOBALAMIN (,VITAMIN B-12,) 1000 MCG/ML  INJECTION    INJECT 1 ML (1,000 MCG TOTAL) INTO THE MUSCLE ONCE.   FENOFIBRATE 160 MG TABLET    Take 1 tablet (160 mg total) by mouth daily.   FERROUS SULFATE 325 (65 FE) MG EC TABLET    Take 325 mg by mouth 2 (two) times daily.    FUROSEMIDE (LASIX) 20 MG TABLET    Take 1 tablet (20 mg total) by mouth daily.   GLUCOSE BLOOD (BAYER CONTOUR TEST) TEST STRIP    1 each by Other route 4 (four) times daily as needed for other. And lancets 2/dat  250.03   GNP LANCETS MICRO THIN 33G MISC    Use as directed   INSULIN NPH (HUMULIN N PEN) 100 UNIT/ML INJECTION    Inject 110 Units into the skin every morning.   INSULIN PEN NEEDLE 31G X 8 MM MISC    Use as directed with the humulin pen insulin   LEVOTHYROXINE (SYNTHROID) 150 MCG TABLET    Take 1 tablet (150 mcg total) by mouth daily.   NEEDLE, DISP, 25 G (BD DISP NEEDLES) 25G X 5/8" MISC    Use as directed to administer  the b12 injection monthly   RANITIDINE (ZANTAC) 150 MG CAPSULE    Take 1 capsule (150 mg total) by mouth every evening.   TRIAMCINOLONE CREAM (KENALOG) 0.1 %    Apply topically 3 (three) times daily. As needed for itching  Modified Medications   Modified Medication Previous Medication   AMLODIPINE (NORVASC) 5 MG TABLET amLODipine (NORVASC) 5 MG tablet      TAKE 1 TABLET (5 MG TOTAL) BY MOUTH DAILY.    Take 1 tablet (5 mg total) by mouth daily.  Discontinued Medications   AMOXICILLIN-CLAVULANATE (AUGMENTIN) 875-125 MG PER TABLET    Take 1 tablet by mouth 2 (two) times daily.   PREDNISONE (STERAPRED UNI-PAK) 5 MG TABS    Take 1 tablet (5 mg total) by mouth as directed.

## 2012-01-19 NOTE — Patient Instructions (Addendum)
Today we updated your med list in our EPIC system...    Continue your current medications the same...  Today we did your follow up FASTING blood work...    We will contact you w/ the results when available...  Call for any problems...  Let's plan a follow up visit in about 4 month's time.Marland KitchenMarland Kitchen

## 2012-01-22 DIAGNOSIS — D649 Anemia, unspecified: Secondary | ICD-10-CM

## 2012-01-22 MED ORDER — CYANOCOBALAMIN 1000 MCG/ML IJ SOLN
1000.0000 ug | Freq: Once | INTRAMUSCULAR | Status: AC
  Administered 2012-01-22: 1000 ug via INTRAMUSCULAR

## 2012-01-25 ENCOUNTER — Other Ambulatory Visit: Payer: Self-pay | Admitting: Pulmonary Disease

## 2012-02-16 ENCOUNTER — Ambulatory Visit (INDEPENDENT_AMBULATORY_CARE_PROVIDER_SITE_OTHER): Payer: Medicare Other | Admitting: Endocrinology

## 2012-02-16 ENCOUNTER — Encounter: Payer: Self-pay | Admitting: Endocrinology

## 2012-02-16 VITALS — BP 122/74 | HR 76 | Temp 98.6°F | Wt 172.0 lb

## 2012-02-16 DIAGNOSIS — E538 Deficiency of other specified B group vitamins: Secondary | ICD-10-CM

## 2012-02-16 DIAGNOSIS — E1029 Type 1 diabetes mellitus with other diabetic kidney complication: Secondary | ICD-10-CM

## 2012-02-16 MED ORDER — CYANOCOBALAMIN 1000 MCG/ML IJ SOLN: 1000.0000 ug | Freq: Once | INTRAMUSCULAR | Status: DC

## 2012-02-16 NOTE — Progress Notes (Signed)
  Subjective:    Patient ID: Jocelyn Mendez, female    DOB: Jun 14, 1930, 79 y.o.   MRN: [id number]  HPI Pt returns for f/u of insulin-requiring DM (dx'ed [id number]; complicated by renal insuff and peripheral sensory neuropathy; she has chosen qd NPH insulin, for simplicity).   she brings a record of her cbg's which i have reviewed today.  It varies from 70-200, but most are in the 100's.  It is in general higher as the day goes on   Review of Systems denies hypoglycemia    Objective:   Physical Exam VITAL SIGNS: See vs page  GENERAL: no distress  Pulses: dorsalis pedis intact bilat.  Feet: no deformity. no ulcer on the feet. feet are of normal color and temp. Trace bilat leg edema. There is bilateral onychomycosis.  Neuro: sensation is intact to touch on the feet, but decreased from normal   Lab Results  Component Value Date   HGBA1C 8.8* 01/19/2012      Assessment & Plan:  DM: she needs increased rx.  therapy limited by pt's need for a simple regimen.

## 2012-02-16 NOTE — Patient Instructions (Addendum)
check your blood sugar 2 times a day.  vary the time of day when you check, between before the 3 meals, and at bedtime.  also check if you have symptoms of your blood sugar being too high or too low.  please keep a record of the readings and bring it to your next appointment here.  please call us sooner if your blood sugar goes below 70, or if it stays over 200.   Please increase the NPH insulin to 120 units each morning.   Please come back for a follow-up appointment in 3 months.

## 2012-02-23 ENCOUNTER — Telehealth: Payer: Self-pay | Admitting: Endocrinology

## 2012-02-23 MED ORDER — INSULIN NPH (HUMAN) (ISOPHANE) 100 UNIT/ML ~~LOC~~ SUSP: 120.0000 [IU] | Freq: Every morning | SUBCUTANEOUS | Status: DC

## 2012-02-23 NOTE — Telephone Encounter (Signed)
The patient's daughter came into the office to see why the pharmacy only got a rx for 2 boxes of NPH insulin and not 3.  The patient's daughter stated that [facility] they only had two boxes.  The patient's daughter stated that she will be out of insulin today and "needs this done as soon as possible."  Please call patient at [phone] when the rx has been called in to the pharmacy.

## 2012-02-25 ENCOUNTER — Telehealth: Payer: Self-pay | Admitting: Endocrinology

## 2012-02-25 NOTE — Telephone Encounter (Signed)
Reduce insulin to 110 units qam

## 2012-02-25 NOTE — Telephone Encounter (Signed)
The patient's daughter called to report that since the increase in insulin from last office visit her mother has been having low blood sugars.  The patient's daughter states that this morning her mother's blood glucose was 48 mcg/dl. The daughter is very concerned and would like to speak with Dr. Loanne Drilling or Davy Pique.  The patient's daughter Estill Bamberg may be reached at [phone].

## 2012-02-25 NOTE — Telephone Encounter (Signed)
The patient's daughter Estill Bamberg called to state that she did receive the message. She may be reached at [phone] if needed.

## 2012-04-06 ENCOUNTER — Telehealth: Payer: Self-pay | Admitting: Pulmonary Disease

## 2012-04-06 NOTE — Telephone Encounter (Signed)
LMTCB X1 

## 2012-04-06 NOTE — Telephone Encounter (Signed)
Pt's daughter aware she will need to bring the jury summons to the office in order for SN to write a letter excusing the pt from jury duty. Daughter verbalized understanding and said she will bring this to the office next week.

## 2012-04-08 ENCOUNTER — Telehealth: Payer: Self-pay | Admitting: Pulmonary Disease

## 2012-04-13 ENCOUNTER — Other Ambulatory Visit: Payer: Self-pay | Admitting: Pulmonary Disease

## 2012-04-19 ENCOUNTER — Encounter: Payer: Self-pay | Admitting: *Deleted

## 2012-04-19 NOTE — Telephone Encounter (Signed)
Called and spoke with pt and she is aware that the jury letter has been completed and we will mail her a copy of the jury summons.  Nothing further is needed.

## 2012-05-17 ENCOUNTER — Encounter: Payer: Self-pay | Admitting: Pulmonary Disease

## 2012-05-17 ENCOUNTER — Ambulatory Visit: Payer: Medicare Other | Admitting: Pulmonary Disease

## 2012-05-17 ENCOUNTER — Other Ambulatory Visit (INDEPENDENT_AMBULATORY_CARE_PROVIDER_SITE_OTHER): Payer: Medicare Other

## 2012-05-17 ENCOUNTER — Encounter: Payer: Self-pay | Admitting: Endocrinology

## 2012-05-17 ENCOUNTER — Ambulatory Visit (INDEPENDENT_AMBULATORY_CARE_PROVIDER_SITE_OTHER): Payer: Medicare Other | Admitting: Endocrinology

## 2012-05-17 ENCOUNTER — Ambulatory Visit (INDEPENDENT_AMBULATORY_CARE_PROVIDER_SITE_OTHER): Payer: Medicare Other | Admitting: Pulmonary Disease

## 2012-05-17 VITALS — BP 120/66 | HR 69 | Temp 98.3°F | Ht 66.0 in | Wt 178.2 lb

## 2012-05-17 VITALS — BP 140/76 | HR 84 | Wt 177.0 lb

## 2012-05-17 DIAGNOSIS — E538 Deficiency of other specified B group vitamins: Secondary | ICD-10-CM

## 2012-05-17 DIAGNOSIS — E039 Hypothyroidism, unspecified: Secondary | ICD-10-CM

## 2012-05-17 DIAGNOSIS — K219 Gastro-esophageal reflux disease without esophagitis: Secondary | ICD-10-CM

## 2012-05-17 DIAGNOSIS — E559 Vitamin D deficiency, unspecified: Secondary | ICD-10-CM

## 2012-05-17 DIAGNOSIS — F411 Generalized anxiety disorder: Secondary | ICD-10-CM

## 2012-05-17 DIAGNOSIS — E1029 Type 1 diabetes mellitus with other diabetic kidney complication: Secondary | ICD-10-CM

## 2012-05-17 DIAGNOSIS — M545 Low back pain, unspecified: Secondary | ICD-10-CM

## 2012-05-17 DIAGNOSIS — K5730 Diverticulosis of large intestine without perforation or abscess without bleeding: Secondary | ICD-10-CM

## 2012-05-17 DIAGNOSIS — E785 Hyperlipidemia, unspecified: Secondary | ICD-10-CM

## 2012-05-17 DIAGNOSIS — D638 Anemia in other chronic diseases classified elsewhere: Secondary | ICD-10-CM

## 2012-05-17 DIAGNOSIS — E1065 Type 1 diabetes mellitus with hyperglycemia: Secondary | ICD-10-CM

## 2012-05-17 DIAGNOSIS — M159 Polyosteoarthritis, unspecified: Secondary | ICD-10-CM

## 2012-05-17 DIAGNOSIS — N259 Disorder resulting from impaired renal tubular function, unspecified: Secondary | ICD-10-CM

## 2012-05-17 DIAGNOSIS — I10 Essential (primary) hypertension: Secondary | ICD-10-CM

## 2012-05-17 LAB — HEMOGLOBIN A1C: Hgb A1c MFr Bld: 9.7 % — ABNORMAL HIGH (ref 4.6–6.5)

## 2012-05-17 LAB — BASIC METABOLIC PANEL
BUN: 27 mg/dL — ABNORMAL HIGH (ref 6–23)
CO2: 25 mEq/L (ref 19–32)
Chloride: 105 mEq/L (ref 96–112)
Creatinine, Ser: 1.7 mg/dL — ABNORMAL HIGH (ref 0.4–1.2)

## 2012-05-17 LAB — HEPATIC FUNCTION PANEL
ALT: 26 U/L (ref 0–35)
Total Protein: 7.6 g/dL (ref 6.0–8.3)

## 2012-05-17 LAB — LIPID PANEL
Cholesterol: 163 mg/dL (ref 0–200)
Triglycerides: 240 mg/dL — ABNORMAL HIGH (ref 0.0–149.0)
VLDL: 48 mg/dL — ABNORMAL HIGH (ref 0.0–40.0)

## 2012-05-17 LAB — CBC WITH DIFFERENTIAL/PLATELET
Basophils Relative: 1.1 % (ref 0.0–3.0)
Eosinophils Relative: 3.2 % (ref 0.0–5.0)
Lymphocytes Relative: 21.3 % (ref 12.0–46.0)
Monocytes Relative: 11.1 % (ref 3.0–12.0)
Neutrophils Relative %: 63.3 % (ref 43.0–77.0)
RBC: 3.83 Mil/uL — ABNORMAL LOW (ref 3.87–5.11)
WBC: 5.4 10*3/uL (ref 4.5–10.5)

## 2012-05-17 LAB — LDL CHOLESTEROL, DIRECT: Direct LDL: 101 mg/dL

## 2012-05-17 NOTE — Patient Instructions (Addendum)
Today we updated your med list in our EPIC system...    Continue your current medications the same...  Today we did your follow up FASTING blood work...    We will contact you w/ the results when available...   Keep up the good work w/ your diet & exercise...  Call for any questions...  Let's plan a follow up visit in 22mo, sooner if needed for problems.Marland KitchenMarland Kitchen

## 2012-05-17 NOTE — Patient Instructions (Addendum)
check your blood sugar 2 times a day.  vary the time of day when you check, between before the 3 meals, and at bedtime.  also check if you have symptoms of your blood sugar being too high or too low.  please keep a record of the readings and bring it to your next appointment here.  please call us sooner if your blood sugar goes below 70, or if it stays over 200.   blood tests are being requested for you today.  We'll contact you with results.  If it is high, we can add a few units of insulin in the evening. Please come back for a follow-up appointment in 3 months.

## 2012-05-17 NOTE — Progress Notes (Signed)
Subjective:    Patient ID: Jocelyn Mendez, female    DOB: 1930/07/25, 79 y.o.   MRN: [id number]  HPI 79 y/o WF here for a follow up visit... she has mult med problems including:  HBP, Hypercholesterolemia, DM, mild renal insuffic, & multifactorial anemia w/ B12 defic on shots monthly... she also has DJD, LBP, Osteopenia, etc...  << see prev notes for earlier entries >>  ~  March 17, 2011:  1mo ROV & she notes doing better, no new complaints or concerns; followed by DrKerr every 82mo & DrGoldsborough every 6 months>    HBP> on Norvasc5; BP= 130/70 & stable; denies CP, palpit, ch in dyspnea or edema...    DM> on LEVEMIR (now 46uBid) & Onglyza 2.5mg /d; followed by DrKerr & his note of 10/12 is reviewed- A1c= 9.0 then while on 80u Levemir/d... LABS TODAY show FBS=229, A1c=11.0, and copies sent to Endocrine.    Hyperlipid> on Lip20 + Fenofib160; needs to ret fasting for f/u FLP ==> TChol 147, TG 165, HDL 30, LDL 85; rec better low fat diet & incr exercise...    Hypothyroid> on Synthroid 177mcg/d; TSH had been controlled on this dose but f/u TSH= 20.8 & pharm confirms regular refills! This really doesn't make much sense but we will incr Synthroid to 163mcg/d dose...     GI> GERD, Divertics> on Nexium prn + Miralax & Senakot-S...    Renal Insuffic> followed by Nephrology [facility] & her note of 10/12 is reviewed & Creat was 1.8.Marland KitchenMarland Kitchen    DJD, LBP, Osteopenia> she knows to avoid NSAIDs & uses Tylenol as needed + Vit D supplement...    Anxiety> she is not currently on an anxiolytic agent even though I have rec low dose Alpraz in the past...    Anemia & B12 Defic> on Fe orally & B12 shots monthly (she notes her Pharm was out of parenteral B12 recently); Hg stable in the 10-12 range...  ~  August 05, 2011:  4-90mo ROV and [name] called for an add-on visit due to an upper resp infection w/ cough, yellow-green sputum, low grade fever & aching all over; taking Robitssin & Tylenol but no better; CXR today shows  some peribronchial thickening but no infiltrate & exam shows scat rhonchi but no consolidation; we decided to treat w/ Levaquin, Mucinex, Fluids,  & Tussionex for her cough w/ CP...    She has had 4 f/u visits w/ DrEllison since I last saw her> her A1c is improved down to 8.9 recently; she is on HumulinN 100uQd; pt & daugh likes DrEllison...    She wants to stop the Nexium (too expensive) & switch to Zantac; I suggested Prilosec ($20 for 90 pills at [facility]) but they want the Zantac...    She saw Nephrology, Guinevere Scarlet 4/13> Stage III CKD, Creat in the 1.5-2.0 range, GFR in the 30s, BP & vol status were good=> given LASIX 20mg  to use prn swelling.    We reviewed prob list, meds, xrays and labs> see below>> CXR 6/13 showed norm heart size, min streaky atx at bases w/ sl peribronchial thickening c/w bronchitis, otherw clear, NAD.Marland KitchenMarland Kitchen  ~  September 15, 2011:  6wk [name] is doing reasonably well overall> she is requesting a new DM meter & we wrote perscription for this to get at her Pharm;  On Humulin N 100u daily & she notes BS "all over the place" & blames her meter- we reviewed diet, exercise, proper insulin injection technique etc;  BP controlled on  Norvasc & doesn't use the Lasix except maybe once or twice per month;  Due for f/u labs today & tells me she's taking everything regularly (including the Levothy125);  She has some mod CWP & tender- discussed rest, heat, offered Tramadol but she prefers to stick w/ Tylenol alone...    We reviewed prob list, meds, xrays and labs> see below for updates >> LABS 8/13:  FLP- chol ok but TG=232 HDL=30 on Lip20+Feno160;  Chems- BS=89 A1c=8.8 on NPH100u/d per DrEllison, & BUN=34 Creat=1.7 (stable);  TSH=20 on Levothy125...  ~  October 16, 2011:  7mo ROV & urgent add-on appt for infected cyst on her back> daugh called today w/ raised red tender knot on upper right back; exam shows a mod size sebaceous cyst, tender to palpation w/ sm amt beige pus draining;  no other lesion apparent, no f/c/s, VS are stable & we discussed Rx w/ warm compresses, apply neosporin, clean gauze, etc; start Rx w/ Duricef 500mg  Bid x10d & take Align...  Reminded to drink plenty of water, stay well hydrated & check BS regularly;  She requests 2013 Flu vaccine today- ok...  ~  January 19, 2012:  40mo ROV & prev infected cyst has resolved; here for f/u visit & she reports doing fine- no new complaints or concerns...  We reviewed the following medical problems during today's office visit>>     HxAB> she gets freq exac & requires antibiotics, etc...    HBP> on Amlod5, Lasix20mg  prn swelling; BP= 142/70 & she denies CP, palpit, ch in SOB, edema, etc...    HxCP> on ASA81 & she needs to be more active, incr exercise program...    CHOL> on Lip20 & Feno160; last FLP 8/13 showed TChol 159, TG 232, HDL 30, LDL 99    DM> on NovolinN per DrEllison- currently 110u Qam; last labs 8/13 showed BS=89 & A1c=8.8    Hypothy> on Synthroid150; 8/13 TSH=20.2; reminded to take it everyday first thing in the AM on empty stomach...    GI- GERD, Divertics> on Zantac150; she denies abd pain, dysphagia, n/v, d/c, blood seen...    Renal Insiuffic> she has seen [facility] for Nephrology; stable off nephrotoxic meds; Creat~1.6-1.7 range...    DJD, LBP, Osteopenia> on calcium, MVI, Vit D supplement; uses Tylenol prn pain; notes discomfort in hands etc...    Anxiety> on Alpraz0.25 as needed...    Anemia, B12 Defic> on Fe325mg /d & B12 injection monthly We reviewed prob list, meds, xrays and labs> see below for updates >>   ~  May 17, 2012:  8mo ROV & [name]- feels well w/o new complaints or concerns... We reviewed the following medical problems during today's office visit >>     HxAB> she reports breathing is good w/o recent resp exac & denies much cough, sput, SOB, etc...    HBP on Amlod5, Lasix20mg  prn swelling; BP= 120/66 & she denies CP, palpit, ch in SOB, edema, etc...    Chol  controlled on Lip20 & Feno160; FLP today showed TChol 163, TG 240, HDL 27, LDL 101; we discussed better low fat diet, incr exercise & wt reduction...    She saw DrEllison earlier today for f/u DM w/ RI & PN; on NPH insulin taking 110u daily & labs today showed BS= 148, A1c= 9.7 (it was 8.8 in [place]) and DrEllison is in charge of her DM meds...    She remains on Synthroid150; reminded to take it everyday first thing in the AM on  empty stomach; TSH = 16.91 and it seems unlikely that 188mcg isn't enough for her, reminded to take it every day & bring all med bottles to every office visit...    She saw Nephrology last 10/13 [facility]) w/ Stage3 CKD; Creat stable ~1.7 & calcGFR ~30; no further suggestions & disch from her clinic; labs today showed Cr= 1.7, stable...    Anxiety> on Alpraz0.25 prn...    She has Anemia of chr dis, Hx low Fe, & B12 defic> w/ Hg in the 10-12 range on Fe-bid & B12 shots monthly; Labs 4/14 showed Hg=11.8, continue same... We reviewed prob list, meds, xrays and labs> see below for updates >>  LABS 4/14:  FLP- Chol ok but TG=240, HDL=27;  Chems- ok x BS=148, Aic=9.7, Cr=1.7;  CBC w/ Hg=11.8;  TSH=16.91;  VitD=34           Problem List:  Hx of ASTHMATIC BRONCHITIS, ACUTE (ICD-466.0) - no recent bronchitic episodes... the increased activity has really helped her... ~  CXR 5/12 showed clear lungs x mild scarring left base & apical pleural thickening... ~  CXR 6/13 showed norm heart size, min streaky atx at basesw/ sl peribronchial thickening c/w bronchitis, otherw clear, NAD...  HYPERTENSION (ICD-401.9) - prev on Diovan/HCT but this was stopped 4/12 Hosp & now on NORVASC 5mg /d... ~  10/12:  BP= 128/62 & she denies CP, palpit, syncope, SOB, edema... ~  2/13:  BP= 130/70 & she feels well & denies symptoms... ~  4/13:  DrGoldsborough started LASIX 20mg /d as needed for swelling... ~  6/13:  BP= 120/60 & she notes some CP from her cough, but no palpit/ ch in SOB, etc... ~  8/13:   BP= 130/60 & although she c/o left CWP, she denies palpit, ch in SOB, recent edema, etc... ~  12/13: on Amlod5, Lasix20mg  prn swelling; BP= 142/70 & she denies CP, palpit, ch in SOB, edema, etc... ~  4/14:  on Amlod5, Lasix20mg  prn swelling; BP= 120/66 & she denies CP, palpit, ch in SOB, edema, etc...  Hx of CHEST PAIN (ICD-786.50) - on ASA 81mg /d;  she continues to experience intermittent sharp left CWP- Rx w/ Tylenol Prn. ~  cath 1988 showed normal coronaries, min MVP... ~  Cards eval 4/12 by DrNishan> mult risk factors, atypCP, EKG w/ NSSTTWA, planned Myoview but not done due to subseq hosp 4/12... ~  8/13:  She is c/o left CWP- sharp, tender in left axillary area, etc; discussed rest, heat, Tylenol (she does not want stronger pain med).  DM (ICD-250.00) - followed by DrEllison> DM w/ Nephropathy & Neuropathy> on HUMULIN N 100u daily; (off Metformin rx since 4/12 hosp)... ~  labs 8/08 showed BS= 113, HgA1c=5.4.Marland KitchenMarland Kitchen On Metform + Glimep. ~  labs 4/09 showed BS= 99, HgA1c= 5.5.Marland KitchenMarland Kitchen Stopped Glimep. ~  labs 4/10 showed BS= 125, A1c= 7.1.Marland Kitchen. rec> better diet, same meds for now. ~  labs 4/11 showed BS= 266, A1c= 9.1.Marland KitchenMarland Kitchen what happened? Add Glimep1mg /d back. ~  labs 6/11 showed BS= 195, A1c= 7.9.Marland KitchenMarland Kitchen incr Glimep to 2mg /d. ~  labs 10/11 showed BS= 175, A1c= 7.9.Marland KitchenMarland Kitchen rec incr Gimep to 4mg /d. ~  Labs in [facility] 5/12 showed BS= 232==>139; A1c=9.7; Metform stopped in hosp, on [place]. ~  Labs 5/12 post hosp showed BS= 255, A1c= 8.6.Marland KitchenMarland Kitchen Referred to Endocrinology. ~  7/12:  BS at home all in the 200s, she doesn't want additional meds, wants to wait for appt w/ DrKerr 7/24. ~  8/12:  She was seen by DrKerr &  her started her on Levemir insulin + Onglyza 2.5mg /d ==> his notes & labs are reviewed... ~  10/12: Note from DrKerr> pt on Levemir 80u + Onglyza 2.5mg /d w/ A1c= 9.0.Marland Kitchen. ~  Labs here 2/13 showed FBS= 229, A1c= 11.0 & we will fax to DrKerr for his review... ~  Pt switched to DrEllison in the interval> meds  changed to HUMULIN N & now on 100u daily w/ recent A1c=8.9.Marland Kitchen. ~  Labs 8/13 on HumulinN100/d showed BS= 89, A1c= 8.8... We reviewed diet, exercise, & f/u w/ DrEllison. ~  4/14: on NPH insulin taking 110u daily & labs today showed BS= 148, A1c= 9.7 (it was 8.8 in [place]) and DrEllison is in charge of her DM meds.  HYPERLIPIDEMIA (ICD-272.4) - on LIPITOR 20mg /d & FENOFIBRATE 160mg /d (ch from Lopid 10/10). ~  FLP 8/08 showed TChol 129, TG 197, HDL 17, LDL 73... despite taking statin & fibrate drugs. ~  FLP 4/09 on Lip20+Tric145 showed TChol 127, TG 269, HDL 13, LDL 69... rec> ch Tricor to [phone]. ~  [place] 10/09 on Lip20+Trilip135 showed TChol 124, TG 247, HDL 10!, LDL 64... rec> ch Trilip to LopidBid. ~  FLP 4/10 on Lip20+LopidBid showed TChol 174, TG 239, HDL 38, LDL 103... rec> ch Lopid to Feno160 (she never did). ~  FLP 10/10 showed TChol 134, TG 256, HDL 10, LDL 76... change the Lopid to Fenofib160/d. ~  FLP 4/11 on Lip20+Feno160 showed TChol 163, TG 234, HDL 28, LDL 108... keep same, better diet! ~  FLP 10/11 on Lip20+Feno160 showed TChol 172, Tg 304, HDL 26, LDL 112... take meds regularly & better diet. ~  FLP 4/12 on Lip20+Feno160 showed TChol 160, TG 258, HDL 19, LDL 100... Needs better low fat diet. ~  FLP 2/13 on Lip20+Feno160 showed TChol 147, TG 165, HDL 30, LDL 85 ~  FLP 8/13 on Lip20+Feno160 showed TChol 159, TG 232, HDL 30, LDL 99 ~  FLP 4/14 on Lip20+Feno160 showed TChol 163, TG 240, HDL 27, LDL 101  HYPOTHYROIDISM (ICD-244.9) - Hx remote Thyroid surg & scar in neck> on SYNTHROID 134mcg/d... ~  last TSH 1/08 = 0.41 ~  labs 4/09 showed TSH= 2.35 ~  labs 10/09 on Levothy125 showed TSH= 0.97 ~  labs 4/10 on Levothy125 showed TSH= 0.08... rec> decr [phone]. ~  labs 10/10 on Levothy112 showed TSH= 2.18... keep same. ~  labs 4/11 on Levo112 showed TSH= 0.51 ~  Labs 4/12 on Levo112 showed TSH= 1.02 ~  Labs 2/13 on Levo112 showed TSH= 20.8.Marland KitchenMarland Kitchen Why? Pharm confirms regular refills, we  will incr dose to 175mcg/d. ~  Labs 8/13 on Levo125 showed TSH= 20.2.Marland KitchenMarland Kitchen We decided to incr LEVOTHYROID 136mcg/d... ~  Labs 4/14 on Levo150 showed TSH= 16.91... This dose shouldbeadeq & rec to take 1st thing in AM on empty stomach etc...  GERD (ICD-530.81) - takes Sc[place] 40mg  Prn (states Omep20 "makes me sick")...  ~  EGD was planned in 2006 eval but never done... ~  6/13:  She want to switch to ZANTAC 150mg /d since Nexium is too expensive  DIVERTICULOSIS OF COLON (ICD-562.10) - c/o constip & rec> Miralax + Senakot-S... ~  last colonoscopy 3/06 by DrDBrodie showed divertics only...  RENAL INSUFFICIENCY (ICD-588.9) - prev Creat in the 1.7 - 2.0 range... ~  labs 8/08 showed BUN=26, Creat=1.5 ~  labs 4/09 showed BUN= 34, Creat= 1.5 ~  labs 10/09 showed BUN= 31, Creat= 1.5 ~  labs 4/10 showed BUN= 28, Creat= 1.0, Microalb= pos... continue Rx (on Diovan). ~  labs 10/10 showed BUN= 22, Creat= 1.2 ~  labs 4/11 showed BUN= 20, Creat= 1.5 ~  labs 6/11 showed BUN= 20, Creat= 1.3 ~  labs 10/11 showed BUN= 27, Creat= 1.5 ~  Renal Ultrasound 5/12 was neg- NAD, no hydronephrosis... ~  Labs in [facility] 5/12 showed Creat= 2.11==>1.4 ~  Labs 5/12 post hosp showed BUN=44, Creat=1.7, K=5.7, HCO3=23;  She has appt w/ Nephrology set up by the Hospitalists. ~  6/12 & 10/12:  Pt seen by DrGoldsborough & her notes are reviewed... ~  Labs 2/13 showed BUN= 33, Creat= 1.6 ~  Labs 8/13 showed BUN= 34, Creat= 1.7 ~  10/13: she had f/u DrGoldborough> Cr stable at 1.7, due to HBP + DM, no other rev causes found, released from her clinic... ~  Labs 4/14 showed BUN= 27, Creat= 1.7  DEGENERATIVE JOINT DISEASE, GENERALIZED (ICD-715.00) - she uses Tylenol Prn. LOW BACK PAIN (ICD-724.2) OSTEOPOROSIS (ICD-733.00) - she takes calcium + vits daily... ~  BMD here 2/06 showed TScores -1.4 Spine,  -1.1 left FemNeck... ~  labs 4/09 showed Vit D level= 24... rec OTC Vit D 1000 u daily, but she stopped this. ~  labs 4/10 showed Vit  D level = 20... rec> incr OTC Vit D 2000 u daily (she never did). ~  labs 4/11 showed Vit D level = 22...Marland KitchenMarland KitchenMarland Kitchen rec> incr to 2000 u daily. ~  Labs 4/12 showed Vit D level = 32 ~  Labs 4/14 showed Vit D level = 34, rec to continue supplements...  HEADACHE (ICD-784.0)  ANXIETY (ICD-300.00) - she notes her Bro has emphysema, lung cancer and CHF... she takes ALPRAZOLAM 0.5mg  Prn.  ANEMIA>  Hx ACD & IronDefic>  Rx FeSO4 + VitC ~  Hx of multifactorial anemia w/ Hg=11 in 2005, Fe was OK, Stools neg for blood, Folate norm, B12 found low, & Creat 1.5+ ~  labs 1/08 showed Hg=11.1 ~  labs 8/08 showed Hg=9.9 ~  labs 4/09 showed Hg= 9.6, Fe=102, TIBC=344, Sat=21% ~  labs 10/09 showed Hg= 9.1, MCV= 92 ~  labs 4/10 showed = 11.1, Fe= 71 ~  labs 10/10 showed Hg= 9.3, MCV= 93... rec Fe Bid w/ Vit C... ~  labs 4/11 showed Hg= 11.8, MCV=87, Fe=86 ~  labs 10/11 showed Hg= 10.9, MCV= 89 ~  Labs in [facility] 5/12 showed Hg= 10.3==>8.6, Fe=19 (5%sat), B12=505, Folate=16, SPE= neg. ~  Labs 2/13 showed Hg= 12.0 ~  Labs 4/14 showed Hg= 11.8  VITAMIN B12 DEFICIENCY (ICD-266.2) - Vit B12 level was 77 in 2006 w/ pos parietal cell antibodies; she's been on B12 shots ever since then...  ~  f/u B12 level 4/10 = >1500 ~  f/u B12 level 5/12= 505 ~  She remains on monthly Vit B12 shots...   Past Surgical History  Procedure Laterality Date  . Appendectomy      Outpatient Encounter Prescriptions as of 05/17/2012  Medication Sig Dispense Refill  . amLODipine (NORVASC) 5 MG tablet TAKE 1 TABLET (5 MG TOTAL) BY MOUTH DAILY.  30 tablet  6  . aspirin 81 MG tablet Take 81 mg by mouth daily.        Marland Kitchen atorvastatin (LIPITOR) 20 MG tablet TAKE 1 TABLET EVERY DAY  30 tablet  11  . Cholecalciferol (VITAMIN D3) 3000 UNITS TABS Take 2 capsules by mouth daily.       . cyanocobalamin (,VITAMIN B-12,) 1000 MCG/ML injection INJECT 1 ML (1,000 MCG TOTAL) INTO THE MUSCLE ONCE.  1 mL  0  .  fenofibrate 160 MG tablet Take 1 tablet (160 mg  total) by mouth daily.  30 tablet  11  . ferrous sulfate 325 (65 FE) MG EC tablet Take 325 mg by mouth 2 (two) times daily.       . furosemide (LASIX) 20 MG tablet Take 1 tablet (20 mg total) by mouth daily.  30 tablet  11  . glucose blood (BAYER CONTOUR TEST) test strip 1 each by Other route 4 (four) times daily as needed for other. And lancets 2/dat  250.03  150 each  6  . GNP LANCETS MICRO THIN 33G MISC Use as directed  100 each  6  . insulin NPH (HUMULIN N PEN) 100 UNIT/ML injection Inject 110 Units into the skin every morning.      . Insulin Pen Needle 31G X 8 MM MISC Use as directed with the humulin pen insulin  50 each  6  . levothyroxine (SYNTHROID) 150 MCG tablet Take 1 tablet (150 mcg total) by mouth daily.  30 tablet  11  . NEEDLE, DISP, 25 G (BD DISP NEEDLES) 25G X 5/8" MISC Use as directed to administer  the b12 injection monthly  25 each  0  . ranitidine (ZANTAC) 150 MG capsule Take 1 capsule (150 mg total) by mouth every evening.  30 capsule  11  . [DISCONTINUED] NEXIUM 40 MG capsule TAKE ONE CAPSULE EVERY DAY AS NEEDED FOR STOMACH ACID  30 capsule  4  . [DISCONTINUED] cyanocobalamin ((VITAMIN B-12)) injection 1,000 mcg        No facility-administered encounter medications on file as of 05/17/2012.    Allergies  Allergen Reactions  . Codeine     REACTION: change in mental status  . Diphenhydramine Hcl     REACTION: itching  . Quinine     REACTION: abnormal sensations in face  . Sulfonamide Derivatives     REACTION: rash    Current Medications, Allergies, Past Medical History, Past Surgical History, Family History, and Social History were reviewed in [employer] record.    Review of Systems        See HPI - all other systems neg except as noted... The patient complains of nausea & diarrhea- now resolved.  The patient denies anorexia, fever, weight loss, weight gain, vision loss, decreased hearing, hoarseness, syncope, peripheral edema, prolonged  cough, headaches, hemoptysis, abdominal pain, melena, hematochezia, severe indigestion/heartburn, hematuria, incontinence, suspicious skin lesions, transient blindness, depression, unusual weight change, abnormal bleeding, enlarged lymph nodes, and angioedema.     Objective:   Physical Exam      WD, WN, 79 y/o WF in NAD...  VS reviewed & BP= 120/64 w/o postural changes... GENERAL:  Alert & oriented; pleasant & cooperative... HEENT:  [place]/AT, EOM-wnl, PERRLA, EACs-clear, TMs- sl red on left, NOSE-clear, THROAT-clear & wnl. NECK:  Decr ROM; no JVD; normal carotid impulses w/o bruits; +thyroid scar, no thyromegaly or nodules palpated; no lymphadenopathy. CHEST:  Clear to P & A; without wheezes/ rales/ or rhonchi heard; +tender chest wall & pectoralis area. HEART:  Regular Rhythm; without murmurs/ rubs/ or gallops detected... ABDOMEN:  Soft & nontender; normal bowel sounds; no organomegaly or masses palpated... EXT: without deformities, mild arthritic changes; no varicose veins/ +venous insuffic/ no edema. NEURO:  CN's intact; no focal neuro deficits or weakness found... DERM:  no lesions noted...  RADIOLOGY DATA:  Reviewed in the EPIC EMR & discussed w/ the patient...   LABORATORY DATA:  Reviewed in the EPIC EMR & discussed  w/ the patient...   Assessment & Plan:    AB>  No recent infectious exac; not on regular meds...  Hypertension>  Her BP remains controlled on Amlodipine monotherapy, & specifically her BP is not too low & there are no postural changes;  Continue same med...  Hx of CP>  She was seen 05/13/10 for a routine 53mo check up & daugh mentioned her atypCP & wanted cardiac eval & seen by DrNishan; EKG w/ poor R progression & NSSTTWA....  Diabetes>  Now followed & managed by DrEllison on HUMULIN N at 110u daily;   Hyperlipidemia>  On Lipitor & Fenofibrate, needs better low fat diet, continue same meds...  Renal Insuffic>  Followed by Guinevere Scarlet & Creat is stable at  1.7.Marland KitchenMarland Kitchen  Hypothyroid>  TSH suddenly incr to ~20 on 174mcg/d, the dose grad incr to 130mcg/d; TSH still ~16 but clinically stable & we will not titrate further...  Anemia>  Multifactorial, on B12 shots monthly & Fe/ VitC supplementation...  Anxiety>  She stopped her prn Alprazolam...  Infected sebaceous cyst on back> resolved...   Patient's Medications  New Prescriptions   No medications on file  Previous Medications   AMLODIPINE (NORVASC) 5 MG TABLET    TAKE 1 TABLET (5 MG TOTAL) BY MOUTH DAILY.   ASPIRIN 81 MG TABLET    Take 81 mg by mouth daily.     ATORVASTATIN (LIPITOR) 20 MG TABLET    TAKE 1 TABLET EVERY DAY   CHOLECALCIFEROL (VITAMIN D3) 3000 UNITS TABS    Take 2 capsules by mouth daily.    CYANOCOBALAMIN (,VITAMIN B-12,) 1000 MCG/ML INJECTION    INJECT 1 ML (1,000 MCG TOTAL) INTO THE MUSCLE ONCE.   FENOFIBRATE 160 MG TABLET    Take 1 tablet (160 mg total) by mouth daily.   FERROUS SULFATE 325 (65 FE) MG EC TABLET    Take 325 mg by mouth 2 (two) times daily.    FUROSEMIDE (LASIX) 20 MG TABLET    Take 1 tablet (20 mg total) by mouth daily.   GLUCOSE BLOOD (BAYER CONTOUR TEST) TEST STRIP    1 each by Other route 4 (four) times daily as needed for other. And lancets 2/dat  250.03   GNP LANCETS MICRO THIN 33G MISC    Use as directed   INSULIN NPH (HUMULIN N PEN) 100 UNIT/ML INJECTION    Inject 110 Units into the skin every morning.   INSULIN PEN NEEDLE 31G X 8 MM MISC    Use as directed with the humulin pen insulin   LEVOTHYROXINE (SYNTHROID) 150 MCG TABLET    Take 1 tablet (150 mcg total) by mouth daily.   NEEDLE, DISP, 25 G (BD DISP NEEDLES) 25G X 5/8" MISC    Use as directed to administer  the b12 injection monthly   RANITIDINE (ZANTAC) 150 MG CAPSULE    Take 1 capsule (150 mg total) by mouth every evening.  Modified Medications   No medications on file  Discontinued Medications   NEXIUM 40 MG CAPSULE    TAKE ONE CAPSULE EVERY DAY AS NEEDED FOR STOMACH ACID

## 2012-05-17 NOTE — Progress Notes (Signed)
Subjective:    Patient ID: Jocelyn Mendez, female    DOB: [birth year], 79 y.o.   MRN: [id number]  HPI Pt returns for f/u of insulin-requiring DM (dx'ed [id number]; complicated by renal insuff and peripheral sensory neuropathy; she has chosen qd NPH insulin, for simplicity; she has never had severe hypoglycemia or DKA).   no cbg record, but states cbg's are cbg's are higher in am than later in the day.  Her insulin had to be reduced after her last ov, due to a mild hypoglycemia episode, at 2 AM.   Past Medical History  Diagnosis Date  . Acute bronchitis   . Unspecified essential hypertension   . Chest pain, unspecified   . Type II or unspecified type diabetes mellitus without mention of complication, not stated as uncontrolled   . Other and unspecified hyperlipidemia   . Unspecified hypothyroidism   . Esophageal reflux   . Diverticulosis of colon (without mention of hemorrhage)   . Unspecified disorder resulting from impaired renal function   . Generalized osteoarthrosis, unspecified site   . Lumbago   . Osteoporosis   . Headache   . Anxiety state, unspecified   . Anemia of other chronic disease   . Other B-complex deficiencies     Past Surgical History  Procedure Laterality Date  . Appendectomy      History   Social History  . Marital Status: Divorced    Spouse Name: N/A    Number of Children: 6  . Years of Education: N/A   Occupational History  . Not on file.   Social History Main Topics  . Smoking status: Former Smoker -- 0.50 packs/day for 5 years    Types: Cigarettes    Quit date: 02/10/1973  . Smokeless tobacco: Former Systems developer    Types: Snuff    Quit date: 04/07/2010  . Alcohol Use: No  . Drug Use: No  . Sexually Active: Not on file   Other Topics Concern  . Not on file   Social History Narrative   1 child passed away at 13 weeks old---?crib death    Current Outpatient Prescriptions on File Prior to Visit  Medication Sig Dispense Refill  . amLODipine  (NORVASC) 5 MG tablet TAKE 1 TABLET (5 MG TOTAL) BY MOUTH DAILY.  30 tablet  6  . aspirin 81 MG tablet Take 81 mg by mouth daily.        Marland Kitchen atorvastatin (LIPITOR) 20 MG tablet TAKE 1 TABLET EVERY DAY  30 tablet  11  . Cholecalciferol (VITAMIN D3) 3000 UNITS TABS Take 2 capsules by mouth daily.       . cyanocobalamin (,VITAMIN B-12,) 1000 MCG/ML injection INJECT 1 ML (1,000 MCG TOTAL) INTO THE MUSCLE ONCE.  1 mL  0  . fenofibrate 160 MG tablet Take 1 tablet (160 mg total) by mouth daily.  30 tablet  11  . ferrous sulfate 325 (65 FE) MG EC tablet Take 325 mg by mouth 2 (two) times daily.       . furosemide (LASIX) 20 MG tablet Take 1 tablet (20 mg total) by mouth daily.  30 tablet  11  . glucose blood (BAYER CONTOUR TEST) test strip 1 each by Other route 4 (four) times daily as needed for other. And lancets 2/dat  250.03  150 each  6  . GNP LANCETS MICRO THIN 33G MISC Use as directed  100 each  6  . Insulin Pen Needle 31G X 8 MM MISC Use as directed  with the humulin pen insulin  50 each  6  . levothyroxine (SYNTHROID) 150 MCG tablet Take 1 tablet (150 mcg total) by mouth daily.  30 tablet  11  . NEEDLE, DISP, 25 G (BD DISP NEEDLES) 25G X 5/8" MISC Use as directed to administer  the b12 injection monthly  25 each  0  . ranitidine (ZANTAC) 150 MG capsule Take 1 capsule (150 mg total) by mouth every evening.  30 capsule  11  . [DISCONTINUED] insulin glargine (LANTUS SOLOSTAR) 100 UNIT/ML injection Inject 100 Units into the skin every morning.  45 mL  PRN   No current facility-administered medications on file prior to visit.    Allergies  Allergen Reactions  . Codeine     REACTION: change in mental status  . Diphenhydramine Hcl     REACTION: itching  . Quinine     REACTION: abnormal sensations in face  . Sulfonamide Derivatives     REACTION: rash    Family History  Problem Relation Age of Onset  . Diabetes Mother     BP 140/76  Pulse 84  Wt 177 lb (80.287 kg)  BMI 28.58 kg/m2  SpO2  97%  Review of Systems Denies LOC    Objective:   Physical Exam VITAL SIGNS:  See vs page GENERAL: no distress SKIN:  Insulin injection sites at the anterior abdomen are normal     Assessment & Plan:  DM: despite the episode of hypoglycemia, she still probably needs increased rx

## 2012-05-18 ENCOUNTER — Other Ambulatory Visit: Payer: Self-pay | Admitting: Pulmonary Disease

## 2012-05-18 ENCOUNTER — Other Ambulatory Visit: Payer: Self-pay | Admitting: Endocrinology

## 2012-05-18 ENCOUNTER — Telehealth: Payer: Self-pay | Admitting: Pulmonary Disease

## 2012-05-18 LAB — VITAMIN D 25 HYDROXY (VIT D DEFICIENCY, FRACTURES): Vit D, 25-Hydroxy: 34 ng/mL (ref 30–89)

## 2012-05-18 MED ORDER — INSULIN NPH (HUMAN) (ISOPHANE) 100 UNIT/ML ~~LOC~~ SUSP: SUBCUTANEOUS | Status: DC

## 2012-05-18 NOTE — Telephone Encounter (Signed)
LMOMTCB x 1 

## 2012-05-18 NOTE — Telephone Encounter (Signed)
Daughter, Estill Bamberg, calling asking for lab results.  Her mother did not understand results.  [phone]

## 2012-05-18 NOTE — Telephone Encounter (Signed)
Estill Bamberg returned call & can be reached at [phone].  Satira Anis

## 2012-05-18 NOTE — Telephone Encounter (Signed)
Spoke with Estill Bamberg, pt's daughter and given lab results per Dr Lenna Gilford and also Dr ellison's instructions to add insulin 10 units each evening.

## 2012-05-21 ENCOUNTER — Telehealth: Payer: Self-pay | Admitting: Pulmonary Disease

## 2012-05-21 ENCOUNTER — Ambulatory Visit (INDEPENDENT_AMBULATORY_CARE_PROVIDER_SITE_OTHER): Payer: Medicare Other | Admitting: Adult Health

## 2012-05-21 ENCOUNTER — Encounter: Payer: Self-pay | Admitting: Adult Health

## 2012-05-21 VITALS — BP 142/76 | HR 88 | Temp 99.1°F | Ht 66.0 in | Wt 176.8 lb

## 2012-05-21 DIAGNOSIS — J209 Acute bronchitis, unspecified: Secondary | ICD-10-CM

## 2012-05-21 MED ORDER — LEVALBUTEROL HCL 0.63 MG/3ML IN NEBU
0.6300 mg | INHALATION_SOLUTION | Freq: Once | RESPIRATORY_TRACT | Status: AC
  Administered 2012-05-21: 0.63 mg via RESPIRATORY_TRACT

## 2012-05-21 MED ORDER — AMOXICILLIN-POT CLAVULANATE 875-125 MG PO TABS: 1.0000 | ORAL_TABLET | Freq: Two times a day (BID) | ORAL | Status: AC

## 2012-05-21 NOTE — Addendum Note (Signed)
Addended by: Thedore Mins D on: 05/21/2012 10:51 AM   Modules accepted: Orders

## 2012-05-21 NOTE — Assessment & Plan Note (Signed)
Flare w/ URI  xopenex neb x 1   Plan  Augmentin 875mg  Twice daily for 7days  Mucinex DM Twice daily  As needed  Cough/congestion  Fluids and rest .  Saline nasal rinses As needed   Claritin 10mg  At bedtime for 5 days  Please contact office for sooner follow up if symptoms do not improve or worsen or seek emergency care  Follow up Dr. Lenna Gilford  As planned

## 2012-05-21 NOTE — Patient Instructions (Addendum)
Augmentin 875mg  Twice daily for 7days  Mucinex DM Twice daily  As needed  Cough/congestion  Fluids and rest .  Saline nasal rinses As needed   Claritin 10mg  At bedtime for 5 days  Please contact office for sooner follow up if symptoms do not improve or worsen or seek emergency care  Follow up Dr. Lenna Gilford  As planned

## 2012-05-21 NOTE — Progress Notes (Signed)
Subjective:    Patient ID: Jocelyn Mendez, female    DOB: 1930/07/25, 79 y.o.   MRN: [id number]  HPI 79 y/o WF here for a follow up visit... she has mult med problems including:  HBP, Hypercholesterolemia, DM, mild renal insuffic, & multifactorial anemia w/ B12 defic on shots monthly... she also has DJD, LBP, Osteopenia, etc...  << see prev notes for earlier entries >>  ~  March 17, 2011:  1mo ROV & she notes doing better, no new complaints or concerns; followed by DrKerr every 82mo & DrGoldsborough every 6 months>    HBP> on Norvasc5; BP= 130/70 & stable; denies CP, palpit, ch in dyspnea or edema...    DM> on LEVEMIR (now 46uBid) & Onglyza 2.5mg /d; followed by DrKerr & his note of 10/12 is reviewed- A1c= 9.0 then while on 80u Levemir/d... LABS TODAY show FBS=229, A1c=11.0, and copies sent to Endocrine.    Hyperlipid> on Lip20 + Fenofib160; needs to ret fasting for f/u FLP ==> TChol 147, TG 165, HDL 30, LDL 85; rec better low fat diet & incr exercise...    Hypothyroid> on Synthroid 177mcg/d; TSH had been controlled on this dose but f/u TSH= 20.8 & pharm confirms regular refills! This really doesn't make much sense but we will incr Synthroid to 163mcg/d dose...     GI> GERD, Divertics> on Nexium prn + Miralax & Senakot-S...    Renal Insuffic> followed by Nephrology [facility] & her note of 10/12 is reviewed & Creat was 1.8.Marland KitchenMarland Kitchen    DJD, LBP, Osteopenia> she knows to avoid NSAIDs & uses Tylenol as needed + Vit D supplement...    Anxiety> she is not currently on an anxiolytic agent even though I have rec low dose Alpraz in the past...    Anemia & B12 Defic> on Fe orally & B12 shots monthly (she notes her Pharm was out of parenteral B12 recently); Hg stable in the 10-12 range...  ~  August 05, 2011:  4-90mo ROV and [name] called for an add-on visit due to an upper resp infection w/ cough, yellow-green sputum, low grade fever & aching all over; taking Robitssin & Tylenol but no better; CXR today shows  some peribronchial thickening but no infiltrate & exam shows scat rhonchi but no consolidation; we decided to treat w/ Levaquin, Mucinex, Fluids,  & Tussionex for her cough w/ CP...    She has had 4 f/u visits w/ DrEllison since I last saw her> her A1c is improved down to 8.9 recently; she is on HumulinN 100uQd; pt & daugh likes DrEllison...    She wants to stop the Nexium (too expensive) & switch to Zantac; I suggested Prilosec ($20 for 90 pills at [facility]) but they want the Zantac...    She saw Nephrology, Guinevere Scarlet 4/13> Stage III CKD, Creat in the 1.5-2.0 range, GFR in the 30s, BP & vol status were good=> given LASIX 20mg  to use prn swelling.    We reviewed prob list, meds, xrays and labs> see below>> CXR 6/13 showed norm heart size, min streaky atx at bases w/ sl peribronchial thickening c/w bronchitis, otherw clear, NAD.Marland KitchenMarland Kitchen  ~  September 15, 2011:  6wk [name] is doing reasonably well overall> she is requesting a new DM meter & we wrote perscription for this to get at her Pharm;  On Humulin N 100u daily & she notes BS "all over the place" & blames her meter- we reviewed diet, exercise, proper insulin injection technique etc;  BP controlled on  Norvasc & doesn't use the Lasix except maybe once or twice per month;  Due for f/u labs today & tells me she's taking everything regularly (including the Levothy125);  She has some mod CWP & tender- discussed rest, heat, offered Tramadol but she prefers to stick w/ Tylenol alone...    We reviewed prob list, meds, xrays and labs> see below for updates >> LABS 8/13:  FLP- chol ok but TG=232 HDL=30 on Lip20+Feno160;  Chems- BS=89 A1c=8.8 on NPH100u/d per DrEllison, & BUN=34 Creat=1.7 (stable);  TSH=20 on Levothy125...  ~  October 16, 2011:  53mo ROV & urgent add-on appt for infected cyst on her back> daugh called today w/ raised red tender knot on upper right back; exam shows a mod size sebaceous cyst, tender to palpation w/ sm amt beige pus draining;  no other lesion apparent, no f/c/s, VS are stable & we discussed Rx w/ warm compresses, apply neosporin, clean gauze, etc; start Rx w/ Duricef 500mg  Bid x10d & take Align...  Reminded to drink plenty of water, stay well hydrated & check BS regularly;  She requests 2013 Flu vaccine today- ok...  ~  January 19, 2012:  46mo ROV & prev infected cyst has resolved; here for f/u visit & she reports doing fine- no new complaints or concerns...  We reviewed the following medical problems during today's office visit>>     HxAB> she gets freq exac & requires antibiotics, etc...    HBP> on Amlod5, Lasix20mg  prn swelling; BP= 142/70 & she denies CP, palpit, ch in SOB, edema, etc...    HxCP> on ASA81 & she needs to be more active, incr exercise program...    CHOL> on Lip20 & Feno160; last FLP 8/13 showed TChol 159, TG 232, HDL 30, LDL 99    DM> on NovolinN per DrEllison- currently 110u Qam; last labs 8/13 showed BS=89 & A1c=8.8    Hypothy> on Synthroid150; 8/13 TSH=20.2; reminded to take it everyday first thing in the AM on empty stomach...    GI- GERD, Divertics> on Zantac150; she denies abd pain, dysphagia, n/v, d/c, blood seen...    Renal Insiuffic> she has seen [facility]; stable off nephrotoxic meds; Creat~1.6-1.7 range...    DJD, LBP, Osteopenia> on calcium, MVI, Vit D supplement; uses Tylenol prn pain; notes discomfort in hands etc...    Anxiety> on Alpraz0.25 as needed...    Anemia, B12 Defic> on Fe325mg /d & B12 injection monthly We reviewed prob list, meds, xrays and labs> see below for updates >>   ~  May 17, 2012:  75mo ROV & [place] reports a good interval- feels well w/o new complaints or concerns...     HBP on Amlod5, Lasix20mg  prn swelling; BP= 120/66 & she denies CP, palpit, ch in SOB, edema, etc...    Chol controlled on Lip20 & Feno160; FLP today showed TChol , TG , HDL, LDL     She saw DrEllison earlier today for f/u DM w/ RI & PN; on NPH insulin taking 110u daily & labs today  showed BS=     She remains on Synthroid150; reminded to take it everyday first thing in the AM on empty stomach; TSH =     She saw Nephrology last 10/13 ([place]) w/ Stage3 CKD; Creat stable ~1.7 & calcGFR ~30; labs today showed Cr=   We reviewed prob list, meds, xrays and labs> see below for updates >>   05/21/2012 Acute OV  Complains of prod cough with yellow mucus,wheezing, increased SOB, tightness in  chest, low grade temp x 4 days. Taking tylenol, cold meds without much help.  No hemoptysis, chest pain, n/v, edema.  Cough is keeping her up at night.  No recent abx and or travel.  Was seen at ov on 4/7 with CPX . Felt great then on 4/8 symptoms began.  Labs showed TSH at 54. 91 , advised to take on empty stomach first thing in am.             Problem List:  Hx of ASTHMATIC BRONCHITIS, ACUTE (ICD-466.0) - no recent bronchitic episodes... the increased activity has really helped her... ~  CXR 5/12 showed clear lungs x mild scarring left base & apical pleural thickening... ~  CXR 6/13 showed norm heart size, min streaky atx at basesw/ sl peribronchial thickening c/w bronchitis, otherw clear, NAD...  HYPERTENSION (ICD-401.9) - prev on Diovan/HCT but this was stopped 4/12 Hosp & now on NORVASC 5mg /d... ~  10/12:  BP= 128/62 & she denies CP, palpit, syncope, SOB, edema... ~  2/13:  BP= 130/70 & she feels well & denies symptoms... ~  4/13:  DrGoldsborough started LASIX 20mg /d as needed for swelling... ~  6/13:  BP= 120/60 & she notes some CP from her cough, but no palpit/ ch in SOB, etc... ~  8/13:  BP= 130/60 & although she c/o left CWP, she denies palpit, ch in SOB, recent edema, etc... ~  12/13: on Amlod5, Lasix20mg  prn swelling; BP= 142/70 & she denies CP, palpit, ch in SOB, edema, etc...  Hx of CHEST PAIN (ICD-786.50) - on ASA 81mg /d;  she continues to experience intermittent sharp left CWP- Rx w/ Tylenol Prn. ~  cath 1988 showed normal coronaries, min MVP... ~  Cards eval  4/12 by DrNishan> mult risk factors, atypCP, EKG w/ NSSTTWA, planned Myoview but not done due to subseq hosp 4/12... ~  8/13:  She is c/o left CWP- sharp, tender in left axillary area, etc; discussed rest, heat, Tylenol (she does not want stronger pain med).  DM (ICD-250.00) - followed by DrEllison> DM w/ Nephropathy & Neuropathy> on HUMULIN N 100u daily; (off Metformin rx since 4/12 hosp)... ~  labs 8/08 showed BS= 113, HgA1c=5.4.Marland KitchenMarland Kitchen On Metform + Glimep. ~  labs 4/09 showed BS= 99, HgA1c= 5.5.Marland KitchenMarland Kitchen Stopped Glimep. ~  labs 4/10 showed BS= 125, A1c= 7.1.Marland Kitchen. rec> better diet, same meds for now. ~  labs 4/11 showed BS= 266, A1c= 9.1.Marland KitchenMarland Kitchen what happened? Add Glimep1mg /d back. ~  labs 6/11 showed BS= 195, A1c= 7.9.Marland KitchenMarland Kitchen incr Glimep to 2mg /d. ~  labs 10/11 showed BS= 175, A1c= 7.9.Marland KitchenMarland Kitchen rec incr Gimep to 4mg /d. ~  Labs in [facility] 5/12 showed BS= 232==>139; A1c=9.7; Metform stopped in hosp, on [place]. ~  Labs 5/12 post hosp showed BS= 255, A1c= 8.6.Marland KitchenMarland Kitchen Referred to Endocrinology. ~  7/12:  BS at home all in the 200s, she doesn't want additional meds, wants to wait for appt w/ DrKerr 7/24. ~  8/12:  She was seen by DrKerr & her started her on Levemir insulin + Onglyza 2.5mg /d ==> his notes & labs are reviewed... ~  10/12: Note from DrKerr> pt on Levemir 80u + Onglyza 2.5mg /d w/ A1c= 9.0.Marland Kitchen. ~  Labs here 2/13 showed FBS= 229, A1c= 11.0 & we will fax to DrKerr for his review... ~  Pt switched to DrEllison in the interval> meds changed to HUMULIN N & now on 100u daily w/ recent A1c=8.9.Marland Kitchen. ~  Labs 8/13 on HumulinN100/d showed BS= 89, A1c= 8.8... We reviewed diet,  exercise, & f/u w/ DrEllison.  HYPERLIPIDEMIA (ICD-272.4) - on LIPITOR 20mg /d & FENOFIBRATE 160mg /d (ch from Lopid 10/10). ~  FLP 8/08 showed TChol 129, TG 197, HDL 17, LDL 73... despite taking statin & fibrate drugs. ~  FLP 4/09 on Lip20+Tric145 showed TChol 127, TG 269, HDL 13, LDL 69... rec> ch Tricor to [phone]. ~  [place] 10/09 on Lip20+Trilip135 showed  TChol 124, TG 247, HDL 10!, LDL 64... rec> ch Trilip to LopidBid. ~  FLP 4/10 on Lip20+LopidBid showed TChol 174, TG 239, HDL 38, LDL 103... rec> ch Lopid to Feno160 (she never did). ~  FLP 10/10 showed TChol 134, TG 256, HDL 10, LDL 76... change the Lopid to Fenofib160/d. ~  FLP 4/11 on Lip20+Feno160 showed TChol 163, TG 234, HDL 28, LDL 108... keep same, better diet! ~  FLP 10/11 on Lip20+Feno160 showed TChol 172, Tg 304, HDL 26, LDL 112... take meds regularly & better diet. ~  FLP 4/12 on Lip20+Feno160 showed TChol 160, TG 258, HDL 19, LDL 100... Needs better low fat diet. ~  FLP 2/13 on Lip20+Feno160 showed TChol 147, TG 165, HDL 30, LDL 85 ~  FLP 8/13 on Lip20+Feno160 showed TChol 159, TG 232, HDL 30, LDL 99  HYPOTHYROIDISM (ICD-244.9) - Hx remote Thyroid surg & scar in neck> on SYNTHROID 125mcg/d... ~  last TSH 1/08 = 0.41 ~  labs 4/09 showed TSH= 2.35 ~  labs 10/09 on Levothy125 showed TSH= 0.97 ~  labs 4/10 on Levothy125 showed TSH= 0.08... rec> decr [phone]. ~  labs 10/10 on Levothy112 showed TSH= 2.18... keep same. ~  labs 4/11 on Levo112 showed TSH= 0.51 ~  Labs 4/12 on Levo112 showed TSH= 1.02 ~  Labs 2/13 on Levo112 showed TSH= 20.8.Marland KitchenMarland Kitchen Why? Pharm confirms regular refills, we will incr dose to 154mcg/d. ~  Labs 8/13 on Levo125 showed TSH= 20.2.Marland KitchenMarland Kitchen We decided to incr LEVOTHYROID 139mcg/d...  GERD (ICD-530.81) - takes NEXIUM 40mg  Prn (states Omep20 "makes me sick")...  ~  EGD was planned in 2006 eval but never done... ~  6/13:  She want to switch to ZANTAC 150mg /d since Nexium is too expensive  DIVERTICULOSIS OF COLON (ICD-562.10) - c/o constip & rec> Miralax + Senakot-S... ~  last colonoscopy 3/06 by DrDBrodie showed divertics only...  RENAL INSUFFICIENCY (ICD-588.9) - prev Creat in the 1.7 - 2.0 range... ~  labs 8/08 showed BUN=26, Creat=1.5 ~  labs 4/09 showed BUN= 34, Creat= 1.5 ~  labs 10/09 showed BUN= 31, Creat= 1.5 ~  labs 4/10 showed BUN= 28, Creat= 1.0, Microalb=  pos... continue Rx (on Diovan). ~  labs 10/10 showed BUN= 22, Creat= 1.2 ~  labs 4/11 showed BUN= 20, Creat= 1.5 ~  labs 6/11 showed BUN= 20, Creat= 1.3 ~  labs 10/11 showed BUN= 27, Creat= 1.5 ~  Renal Ultrasound 5/12 was neg- NAD, no hydronephrosis... ~  Labs in [facility] 5/12 showed Creat= 2.11==>1.4 ~  Labs 5/12 post hosp showed BUN=44, Creat=1.7, K=5.7, HCO3=23;  She has appt w/ Nephrology set up by the Hospitalists. ~  6/12 & 10/12:  Pt seen by DrGoldsborough & her notes are reviewed... ~  Labs 2/13 showed BUN= 33, Creat= 1.6 ~  Labs 8/13 showed BUN= 34, Creat= 1.7  DEGENERATIVE JOINT DISEASE, GENERALIZED (ICD-715.00) - she uses Tylenol Prn. LOW BACK PAIN (ICD-724.2) OSTEOPOROSIS (ICD-733.00) - she takes calcium + vits daily... ~  BMD here 2/06 showed TScores -1.4 Spine,  -1.1 left FemNeck... ~  labs 4/09 showed Vit D level= 24... rec OTC  Vit D 1000 u daily, but she stopped this. ~  labs 4/10 showed Vit D level = 20... rec> incr OTC Vit D 2000 u daily (she never did). ~  labs 4/11 showed Vit D level = 22...Marland KitchenMarland KitchenMarland Kitchen rec> incr to 2000 u daily. ~  Labs 4/12 showed Vit D level = 32  HEADACHE (ICD-784.0)  ANXIETY (ICD-300.00) - she notes her Bro has emphysema, lung cancer and CHF... she takes ALPRAZOLAM 0.5mg  Prn.  ANEMIA>  Hx ACD & IronDefic>  Rx FeSO4 + VitC ~  Hx of multifactorial anemia w/ Hg=11 in 2005, Fe was OK, Stools neg for blood, Folate norm, B12 found low, & Creat 1.5+ ~  labs 1/08 showed Hg=11.1 ~  labs 8/08 showed Hg=9.9 ~  labs 4/09 showed Hg= 9.6, Fe=102, TIBC=344, Sat=21% ~  labs 10/09 showed Hg= 9.1, MCV= 92 ~  labs 4/10 showed = 11.1, Fe= 71 ~  labs 10/10 showed Hg= 9.3, MCV= 93... rec Fe Bid w/ Vit C... ~  labs 4/11 showed Hg= 11.8, MCV=87, Fe=86 ~  labs 10/11 showed Hg= 10.9, MCV= 89 ~  Labs in [facility] 5/12 showed Hg= 10.3==>8.6, Fe=19 (5%sat), B12=505, Folate=16, SPE= neg. ~  Labs 2/13 showed Hg= 12.0  VITAMIN B12 DEFICIENCY (ICD-266.2) - Vit B12 level was 77 in 2006  w/ pos parietal cell antibodies; she's been on B12 shots ever since then...  ~  f/u B12 level 4/10 = >1500 ~  f/u B12 level 5/12= 505 ~  She remains on monthly Vit B12 shots...   Past Surgical History  Procedure Laterality Date  . Appendectomy      Outpatient Encounter Prescriptions as of 05/21/2012  Medication Sig Dispense Refill  . amLODipine (NORVASC) 5 MG tablet TAKE 1 TABLET (5 MG TOTAL) BY MOUTH DAILY.  30 tablet  6  . aspirin 81 MG tablet Take 81 mg by mouth daily.        Marland Kitchen atorvastatin (LIPITOR) 20 MG tablet TAKE 1 TABLET EVERY DAY  30 tablet  11  . BAYER CONTOUR TEST test strip 1 EACH BY OTHER ROUTE 4 (FOUR) TIMES DAILY AS NEEDED FOR OTHER. AND LANCETS 2/DAT 250.03  150 each  6  . Cholecalciferol (VITAMIN D3) 3000 UNITS TABS Take 2 capsules by mouth daily.       . cyanocobalamin (,VITAMIN B-12,) 1000 MCG/ML injection INJECT 1 ML (1,000 MCG TOTAL) INTO THE MUSCLE ONCE.  1 mL  0  . fenofibrate 160 MG tablet TAKE 1 TABLET EVERY DAY  90 tablet  3  . ferrous sulfate 325 (65 FE) MG EC tablet Take 325 mg by mouth 2 (two) times daily.       . furosemide (LASIX) 20 MG tablet Take 1 tablet (20 mg total) by mouth daily.  30 tablet  11  . GNP LANCETS MICRO THIN 33G MISC Use as directed  100 each  6  . insulin NPH (HUMULIN N PEN) 100 UNIT/ML injection 110 units each morning, and 10 units each evening  4 vial  11  . Insulin Pen Needle 31G X 8 MM MISC Use as directed with the humulin pen insulin  50 each  6  . levothyroxine (SYNTHROID) 150 MCG tablet Take 1 tablet (150 mcg total) by mouth daily.  30 tablet  11  . NEEDLE, DISP, 25 G (BD DISP NEEDLES) 25G X 5/8" MISC Use as directed to administer  the b12 injection monthly  25 each  0  . ranitidine (ZANTAC) 150 MG capsule Take 1 capsule (  150 mg total) by mouth every evening.  30 capsule  11   No facility-administered encounter medications on file as of 05/21/2012.    Allergies  Allergen Reactions  . Codeine     REACTION: change in mental  status  . Diphenhydramine Hcl     REACTION: itching  . Quinine     REACTION: abnormal sensations in face  . Sulfonamide Derivatives     REACTION: rash    Current Medications, Allergies, Past Medical History, Past Surgical History, Family History, and Social History were reviewed in [employer] record.    Review of Systems Constitutional:   No  weight loss, night sweats, +  Fevers,  fatigue  HEENT:   No headaches,  Difficulty swallowing,  Tooth/dental problems, or  Sore throat,                No sneezing, itching, ear ache,  +nasal congestion, post nasal drip,   CV:  No chest pain,  Orthopnea, PND, swelling in lower extremities, anasarca, dizziness, palpitations, syncope.   GI  No heartburn, indigestion, abdominal pain, nausea, vomiting, diarrhea, change in bowel habits, loss of appetite, bloody stools.   Resp:    No chest wall deformity  Skin: no rash or lesions.  GU: no dysuria, change in color of urine, no urgency or frequency.  No flank pain, no hematuria   MS:  No joint pain or swelling.  No decreased range of motion.  No back pain.  Psych:  No change in mood or affect. No depression or anxiety.  No memory loss.               Objective:   Physical Exam      WD, WN, 78 y/o WF in NAD...   GENERAL:  Alert & oriented; pleasant & cooperative... HEENT:  [place]/AT,  EACs-clear, TMs- sl red on left, NOSE-clear, THROAT-clear & wnl. NECK:  Decr ROM; no JVD; normal carotid impulses w/o bruits; +thyroid scar, no thyromegaly or nodules palpated; no lymphadenopathy. CHEST:  Clear to P & A; without wheezes/ rales/ or rhonchi heard;  HEART:  Regular Rhythm; without murmurs/ rubs/ or gallops detected... ABDOMEN:  Soft & nontender; normal bowel sounds; no organomegaly or masses palpated... EXT: without deformities, mild arthritic changes; no varicose veins/ +venous insuffic/ no edema. NEURO:   ; no focal neuro deficits or weakness found... DERM:  no lesions  noted...    Assessment & Plan:

## 2012-05-21 NOTE — Telephone Encounter (Signed)
Decided to make an appt w/ TP today instead.  Satira Anis

## 2012-06-02 ENCOUNTER — Telehealth: Payer: Self-pay | Admitting: Pulmonary Disease

## 2012-06-02 MED ORDER — LEVOFLOXACIN 500 MG PO TABS: 500.0000 mg | ORAL_TABLET | Freq: Every day | ORAL | Status: DC

## 2012-06-02 NOTE — Telephone Encounter (Signed)
Per SN---  levaquin 500 mg  #7  1 daily and take align once daily with the abx.

## 2012-06-02 NOTE — Telephone Encounter (Signed)
Spoke with Estill Bamberg-- Made her aware of recs as listed below Aware Rx is at pharmacy and nothing further needed at this time.

## 2012-06-02 NOTE — Telephone Encounter (Signed)
Spoke with Estill Bamberg-- States patient is having Mild fever, headache, prod cough, c/o of some ear pain and has sore throat Taking Mucinex DM with regular maintenance meds Was prescribed Augmentin by TP on 05/21/12, finished course x4 days ago-- sx improved until 2 days ago Estill Bamberg is requesting recs fro Dr. Lenna Gilford, please advise, thank you!  [facility]  Last OV:05/21/12 Next OV:11/16/12  Allergies  Allergen Reactions  . Codeine     REACTION: change in mental status  . Diphenhydramine Hcl     REACTION: itching  . Quinine     REACTION: abnormal sensations in face  . Sulfonamide Derivatives     REACTION: rash

## 2012-06-07 ENCOUNTER — Other Ambulatory Visit: Payer: Self-pay

## 2012-06-07 ENCOUNTER — Telehealth: Payer: Self-pay

## 2012-06-07 MED ORDER — INSULIN NPH (HUMAN) (ISOPHANE) 100 UNIT/ML ~~LOC~~ SUSP: SUBCUTANEOUS | Status: DC

## 2012-06-07 NOTE — Telephone Encounter (Signed)
Voice mail from [facility].  Wants to discuss an issue with pt's medication.

## 2012-07-16 ENCOUNTER — Other Ambulatory Visit: Payer: Self-pay | Admitting: Pulmonary Disease

## 2012-08-02 ENCOUNTER — Other Ambulatory Visit: Payer: Self-pay

## 2012-08-02 ENCOUNTER — Telehealth: Payer: Self-pay | Admitting: Pulmonary Disease

## 2012-08-02 MED ORDER — INSULIN NPH (HUMAN) (ISOPHANE) 100 UNIT/ML ~~LOC~~ SUSP: SUBCUTANEOUS | Status: DC

## 2012-08-02 NOTE — Telephone Encounter (Signed)
Spoke to pt's daughter.  She stated pt's insulin froze in her refrigerator and they want samples.  I advised she call [facility] office since that is who wrote the rx for her.  She advised she did talk to them and she is waiting to hear back from them.  She wanted me to check and see if we have samples incase [facility] office does not.  Daughter can be reached @ [phone]. Dion Saucier

## 2012-08-02 NOTE — Telephone Encounter (Signed)
i have checked both sides of the office and i did not see any of the insulin that the pt takes here in the office. thanks

## 2012-08-02 NOTE — Telephone Encounter (Signed)
Ms. Jocelyn Mendez returned call from triage. Jocelyn Mendez

## 2012-08-02 NOTE — Telephone Encounter (Signed)
lmomtcb x1 

## 2012-08-02 NOTE — Telephone Encounter (Signed)
Spoke with Mrs Frederico Hamman and notified no samples of insulin available at this time

## 2012-08-22 ENCOUNTER — Other Ambulatory Visit: Payer: Self-pay | Admitting: Pulmonary Disease

## 2012-08-23 ENCOUNTER — Encounter: Payer: Self-pay | Admitting: Endocrinology

## 2012-08-23 ENCOUNTER — Ambulatory Visit (INDEPENDENT_AMBULATORY_CARE_PROVIDER_SITE_OTHER): Payer: Medicare Other | Admitting: Endocrinology

## 2012-08-23 VITALS — BP 134/80 | HR 76 | Ht 65.0 in | Wt 175.0 lb

## 2012-08-23 DIAGNOSIS — E1029 Type 1 diabetes mellitus with other diabetic kidney complication: Secondary | ICD-10-CM

## 2012-08-23 LAB — HEMOGLOBIN A1C: Hgb A1c MFr Bld: 8 % — ABNORMAL HIGH (ref 4.6–6.5)

## 2012-08-23 NOTE — Patient Instructions (Addendum)
check your blood sugar 2 times a day.  vary the time of day when you check, between before the 3 meals, and at bedtime.  also check if you have symptoms of your blood sugar being too high or too low.  please keep a record of the readings and bring it to your next appointment here.  please call us sooner if your blood sugar goes below 70, or if it stays over 200.   blood tests are being requested for you today.  We'll contact you with results.   Please come back for a follow-up appointment in 3 months.

## 2012-08-23 NOTE — Progress Notes (Signed)
Subjective:    Patient ID: Jocelyn Mendez, female    DOB: [birth year], 79 y.o.   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   she brings a record of her cbg's which i have reviewed today.  It varies from 95-170.  There is no trend throughout the day.  Past Medical History  Diagnosis Date  . Acute bronchitis   . Unspecified essential hypertension   . Chest pain, unspecified   . Type II or unspecified type diabetes mellitus without mention of complication, not stated as uncontrolled   . Other and unspecified hyperlipidemia   . Unspecified hypothyroidism   . Esophageal reflux   . Diverticulosis of colon (without mention of hemorrhage)   . Unspecified disorder resulting from impaired renal function   . Generalized osteoarthrosis, unspecified site   . Lumbago   . Osteoporosis   . Headache(784.0)   . Anxiety state, unspecified   . Anemia of other chronic disease   . Other B-complex deficiencies     Past Surgical History  Procedure Laterality Date  . Appendectomy      History   Social History  . Marital Status: Divorced    Spouse Name: N/A    Number of Children: 6  . Years of Education: N/A   Occupational History  . Not on file.   Social History Main Topics  . Smoking status: Former Smoker -- 0.50 packs/day for 5 years    Types: Cigarettes    Quit date: 02/10/1973  . Smokeless tobacco: Former Systems developer    Types: Snuff    Quit date: 04/07/2010  . Alcohol Use: No  . Drug Use: No  . Sexually Active: Not on file   Other Topics Concern  . Not on file   Social History Narrative   1 child passed away at 40 weeks old---?crib death    Current Outpatient Prescriptions on File Prior to Visit  Medication Sig Dispense Refill  . aspirin 81 MG tablet Take  81 mg by mouth daily.        Marland Kitchen atorvastatin (LIPITOR) 20 MG tablet TAKE 1 TABLET EVERY DAY  30 tablet  11  . BAYER CONTOUR TEST test strip 1 EACH BY OTHER ROUTE 4 (FOUR) TIMES DAILY AS NEEDED FOR OTHER. AND LANCETS 2/DAT 250.03  150 each  6  . Cholecalciferol (VITAMIN D3) 3000 UNITS TABS Take 2 capsules by mouth daily.       . cyanocobalamin (,VITAMIN B-12,) 1000 MCG/ML injection INJECT 1 INTRAMUSCULARLY ONCE A MONTH  1 mL  4  . fenofibrate 160 MG tablet TAKE 1 TABLET EVERY DAY  90 tablet  3  . ferrous sulfate 325 (65 FE) MG EC tablet Take 325 mg by mouth 2 (two) times daily.       . furosemide (LASIX) 20 MG tablet Take 1 tablet (20 mg total) by mouth daily.  30 tablet  11  . GNP LANCETS MICRO THIN 33G MISC Use as directed  100 each  6  . insulin NPH (HUMULIN N,NOVOLIN N) 100 UNIT/ML injection 110 units each morning, and 10 units each evening  4 vial  11  . Insulin Pen Needle 31G X 8 MM MISC Use as directed with the humulin pen insulin  50 each  6  . levofloxacin (LEVAQUIN)  500 MG tablet Take 1 tablet (500 mg total) by mouth daily.  7 tablet  0  . NEEDLE, DISP, 25 G (BD DISP NEEDLES) 25G X 5/8" MISC Use as directed to administer  the b12 injection monthly  25 each  0  . amLODipine (NORVASC) 5 MG tablet TAKE 1 TABLET (5 MG TOTAL) BY MOUTH DAILY.  30 tablet  6  . levothyroxine (SYNTHROID, LEVOTHROID) 150 MCG tablet TAKE 1 TABLET (150 MCG TOTAL) BY MOUTH DAILY.  30 tablet  11  . ranitidine (ZANTAC) 150 MG capsule Take 1 capsule (150 mg total) by mouth every evening.  30 capsule  11  . [DISCONTINUED] insulin glargine (LANTUS SOLOSTAR) 100 UNIT/ML injection Inject 100 Units into the skin every morning.  45 mL  PRN   No current facility-administered medications on file prior to visit.    Allergies  Allergen Reactions  . Codeine     REACTION: change in mental status  . Diphenhydramine Hcl     REACTION: itching  . Quinine     REACTION: abnormal sensations in face  . Sulfonamide Derivatives      REACTION: rash   Family History  Problem Relation Age of Onset  . Diabetes Mother    BP 134/80  Pulse 76  Ht 5\' 5"  (1.651 m)  Wt 175 lb (79.379 kg)  BMI 29.12 kg/m2  SpO2 95%  Review of Systems denies hypoglycemia.  She has lost a few lbs, due to her efforts.    Objective:   Physical Exam VITAL SIGNS:  See vs page GENERAL: no distress  Lab Results  Component Value Date   HGBA1C 8.0* 08/23/2012      Assessment & Plan:  DM: This insulin regimen was chosen from multiple options, for its simplicity.  The benefits of glycemic control must be weighed against the risks of hypoglycemia.   She needs increased rx.

## 2012-08-25 ENCOUNTER — Other Ambulatory Visit: Payer: Self-pay | Admitting: *Deleted

## 2012-08-25 MED ORDER — INSULIN ISOPHANE HUMAN 100 UNIT/ML KWIKPEN: 120.0000 [IU] | PEN_INJECTOR | Freq: Every morning | SUBCUTANEOUS | Status: DC

## 2012-08-25 MED ORDER — INSULIN NPH (HUMAN) (ISOPHANE) 100 UNIT/ML ~~LOC~~ SUSP: SUBCUTANEOUS | Status: DC

## 2012-09-17 ENCOUNTER — Other Ambulatory Visit: Payer: Self-pay | Admitting: Pulmonary Disease

## 2012-11-16 ENCOUNTER — Other Ambulatory Visit (INDEPENDENT_AMBULATORY_CARE_PROVIDER_SITE_OTHER): Payer: Medicare Other

## 2012-11-16 ENCOUNTER — Ambulatory Visit (INDEPENDENT_AMBULATORY_CARE_PROVIDER_SITE_OTHER): Payer: Medicare Other | Admitting: Pulmonary Disease

## 2012-11-16 ENCOUNTER — Encounter: Payer: Self-pay | Admitting: Pulmonary Disease

## 2012-11-16 VITALS — BP 138/60 | HR 69 | Temp 97.7°F | Ht 66.0 in | Wt 178.8 lb

## 2012-11-16 DIAGNOSIS — I10 Essential (primary) hypertension: Secondary | ICD-10-CM

## 2012-11-16 DIAGNOSIS — K5730 Diverticulosis of large intestine without perforation or abscess without bleeding: Secondary | ICD-10-CM

## 2012-11-16 DIAGNOSIS — D638 Anemia in other chronic diseases classified elsewhere: Secondary | ICD-10-CM

## 2012-11-16 DIAGNOSIS — N259 Disorder resulting from impaired renal tubular function, unspecified: Secondary | ICD-10-CM

## 2012-11-16 DIAGNOSIS — E538 Deficiency of other specified B group vitamins: Secondary | ICD-10-CM

## 2012-11-16 DIAGNOSIS — E039 Hypothyroidism, unspecified: Secondary | ICD-10-CM

## 2012-11-16 DIAGNOSIS — Z23 Encounter for immunization: Secondary | ICD-10-CM

## 2012-11-16 DIAGNOSIS — K219 Gastro-esophageal reflux disease without esophagitis: Secondary | ICD-10-CM

## 2012-11-16 DIAGNOSIS — M159 Polyosteoarthritis, unspecified: Secondary | ICD-10-CM

## 2012-11-16 DIAGNOSIS — M545 Low back pain, unspecified: Secondary | ICD-10-CM

## 2012-11-16 DIAGNOSIS — E1029 Type 1 diabetes mellitus with other diabetic kidney complication: Secondary | ICD-10-CM

## 2012-11-16 DIAGNOSIS — E785 Hyperlipidemia, unspecified: Secondary | ICD-10-CM

## 2012-11-16 DIAGNOSIS — E1065 Type 1 diabetes mellitus with hyperglycemia: Secondary | ICD-10-CM

## 2012-11-16 DIAGNOSIS — F411 Generalized anxiety disorder: Secondary | ICD-10-CM

## 2012-11-16 LAB — LIPID PANEL
HDL: 30.6 mg/dL — ABNORMAL LOW (ref >39.00)
VLDL: 49 mg/dL — ABNORMAL HIGH (ref 0.0–40.0)

## 2012-11-16 LAB — CBC WITH DIFFERENTIAL/PLATELET
Eosinophils Absolute: 0.2 10*3/uL (ref 0.0–0.7)
Eosinophils Relative: 4.3 % (ref 0.0–5.0)
HCT: 35.4 % — ABNORMAL LOW (ref 36.0–46.0)
Lymphs Abs: 1.3 10*3/uL (ref 0.7–4.0)
MCHC: 34.2 g/dL (ref 30.0–36.0)
MCV: 92 fl (ref 78.0–100.0)
Monocytes Absolute: 0.7 10*3/uL (ref 0.1–1.0)
Neutrophils Relative %: 58.8 % (ref 43.0–77.0)
Platelets: 188 10*3/uL (ref 150.0–400.0)
RDW: 13.6 % (ref 11.5–14.6)
WBC: 5.7 10*3/uL (ref 4.5–10.5)

## 2012-11-16 LAB — BASIC METABOLIC PANEL
BUN: 26 mg/dL — ABNORMAL HIGH (ref 6–23)
CO2: 25 mEq/L (ref 19–32)
Chloride: 110 mEq/L (ref 96–112)
GFR: 33.7 mL/min — ABNORMAL LOW (ref >60.00)
Glucose, Bld: 124 mg/dL — ABNORMAL HIGH (ref 70–99)
Potassium: 4.7 mEq/L (ref 3.5–5.1)

## 2012-11-16 LAB — HEPATIC FUNCTION PANEL
ALT: 29 U/L (ref 0–35)
AST: 37 U/L (ref 0–37)
Alkaline Phosphatase: 46 U/L (ref 39–117)
Total Bilirubin: 0.6 mg/dL (ref 0.3–1.2)
Total Protein: 7.3 g/dL (ref 6.0–8.3)

## 2012-11-16 LAB — TSH: TSH: 18.39 u[IU]/mL — ABNORMAL HIGH (ref 0.35–5.50)

## 2012-11-16 LAB — HEMOGLOBIN A1C: Hgb A1c MFr Bld: 8 % — ABNORMAL HIGH (ref 4.6–6.5)

## 2012-11-16 MED ORDER — CYANOCOBALAMIN 1000 MCG/ML IJ SOLN
1000.0000 ug | Freq: Once | INTRAMUSCULAR | Status: AC
  Administered 2012-11-16: 1000 ug via INTRAMUSCULAR

## 2012-11-16 NOTE — Progress Notes (Signed)
Subjective:    Patient ID: Jocelyn Mendez, female    DOB: 1930/07/25, 79 y.o.   MRN: [id number]  HPI 79 y/o WF here for a follow up visit... she has mult med problems including:  HBP, Hypercholesterolemia, DM, mild renal insuffic, & multifactorial anemia w/ B12 defic on shots monthly... she also has DJD, LBP, Osteopenia, etc...  << see prev notes for earlier entries >>  ~  March 17, 2011:  1mo ROV & she notes doing better, no new complaints or concerns; followed by DrKerr every 82mo & DrGoldsborough every 6 months>    HBP> on Norvasc5; BP= 130/70 & stable; denies CP, palpit, ch in dyspnea or edema...    DM> on LEVEMIR (now 46uBid) & Onglyza 2.5mg /d; followed by DrKerr & his note of 10/12 is reviewed- A1c= 9.0 then while on 80u Levemir/d... LABS TODAY show FBS=229, A1c=11.0, and copies sent to Endocrine.    Hyperlipid> on Lip20 + Fenofib160; needs to ret fasting for f/u FLP ==> TChol 147, TG 165, HDL 30, LDL 85; rec better low fat diet & incr exercise...    Hypothyroid> on Synthroid 177mcg/d; TSH had been controlled on this dose but f/u TSH= 20.8 & pharm confirms regular refills! This really doesn't make much sense but we will incr Synthroid to 163mcg/d dose...     GI> GERD, Divertics> on Nexium prn + Miralax & Senakot-S...    Renal Insuffic> followed by Nephrology [place] & her note of 10/12 is reviewed & Creat was 1.8.Marland KitchenMarland Kitchen    DJD, LBP, Osteopenia> she knows to avoid NSAIDs & uses Tylenol as needed + Vit D supplement...    Anxiety> she is not currently on an anxiolytic agent even though I have rec low dose Alpraz in the past...    Anemia & B12 Defic> on Fe orally & B12 shots monthly (she notes her Pharm was out of parenteral B12 recently); Hg stable in the 10-12 range...  ~  August 05, 2011:  4-90mo ROV and [name] called for an add-on visit due to an upper resp infection w/ cough, yellow-green sputum, low grade fever & aching all over; taking Robitssin & Tylenol but no better; CXR today shows  some peribronchial thickening but no infiltrate & exam shows scat rhonchi but no consolidation; we decided to treat w/ Levaquin, Mucinex, Fluids,  & Tussionex for her cough w/ CP...    She has had 4 f/u visits w/ DrEllison since I last saw her> her A1c is improved down to 8.9 recently; she is on HumulinN 100uQd; pt & daugh likes DrEllison...    She wants to stop the Nexium (too expensive) & switch to Zantac; I suggested Prilosec ($20 for 90 pills at [facility]) but they want the Zantac...    She saw Nephrology, Guinevere Scarlet 4/13> Stage III CKD, Creat in the 1.5-2.0 range, GFR in the 30s, BP & vol status were good=> given LASIX 20mg  to use prn swelling.    We reviewed prob list, meds, xrays and labs> see below>> CXR 6/13 showed norm heart size, min streaky atx at bases w/ sl peribronchial thickening c/w bronchitis, otherw clear, NAD.Marland KitchenMarland Kitchen  ~  September 15, 2011:  6wk [name] is doing reasonably well overall> she is requesting a new DM meter & we wrote perscription for this to get at her Pharm;  On Humulin N 100u daily & she notes BS "all over the place" & blames her meter- we reviewed diet, exercise, proper insulin injection technique etc;  BP controlled on  Norvasc & doesn't use the Lasix except maybe once or twice per month;  Due for f/u labs today & tells me she's taking everything regularly (including the Levothy125);  She has some mod CWP & tender- discussed rest, heat, offered Tramadol but she prefers to stick w/ Tylenol alone...    We reviewed prob list, meds, xrays and labs> see below for updates >> LABS 8/13:  FLP- chol ok but TG=232 HDL=30 on Lip20+Feno160;  Chems- BS=89 A1c=8.8 on NPH100u/d per DrEllison, & BUN=34 Creat=1.7 (stable);  TSH=20 on Levothy125...  ~  October 16, 2011:  7mo ROV & urgent add-on appt for infected cyst on her back> daugh called today w/ raised red tender knot on upper right back; exam shows a mod size sebaceous cyst, tender to palpation w/ sm amt beige pus draining;  no other lesion apparent, no f/c/s, VS are stable & we discussed Rx w/ warm compresses, apply neosporin, clean gauze, etc; start Rx w/ Duricef 500mg  Bid x10d & take Align...  Reminded to drink plenty of water, stay well hydrated & check BS regularly;  She requests 2013 Flu vaccine today- ok...  ~  January 19, 2012:  40mo ROV & prev infected cyst has resolved; here for f/u visit & she reports doing fine- no new complaints or concerns...  We reviewed the following medical problems during today's office visit>>     HxAB> she gets freq exac & requires antibiotics, etc...    HBP> on Amlod5, Lasix20mg  prn swelling; BP= 142/70 & she denies CP, palpit, ch in SOB, edema, etc...    HxCP> on ASA81 & she needs to be more active, incr exercise program...    CHOL> on Lip20 & Feno160; last FLP 8/13 showed TChol 159, TG 232, HDL 30, LDL 99    DM> on NovolinN per DrEllison- currently 110u Qam; last labs 8/13 showed BS=89 & A1c=8.8    Hypothy> on Synthroid150; 8/13 TSH=20.2; reminded to take it everyday first thing in the AM on empty stomach...    GI- GERD, Divertics> on Zantac150; she denies abd pain, dysphagia, n/v, d/c, blood seen...    Renal Insiuffic> she has seen [facility] for Nephrology; stable off nephrotoxic meds; Creat~1.6-1.7 range...    DJD, LBP, Osteopenia> on calcium, MVI, Vit D supplement; uses Tylenol prn pain; notes discomfort in hands etc...    Anxiety> on Alpraz0.25 as needed...    Anemia, B12 Defic> on Fe325mg /d & B12 injection monthly We reviewed prob list, meds, xrays and labs> see below for updates >>   ~  May 17, 2012:  8mo ROV & Jocelyn Mendez reports a good interval- feels well w/o new complaints or concerns... We reviewed the following medical problems during today's office visit >>     HxAB> she reports breathing is good w/o recent resp exac & denies much cough, sput, SOB, etc...    HBP on Amlod5, Lasix20mg  prn swelling; BP= 120/66 & she denies CP, palpit, ch in SOB, edema, etc...    Chol  controlled on Lip20 & Feno160; FLP today showed TChol 163, TG 240, HDL 27, LDL 101; we discussed better low fat diet, incr exercise & wt reduction...    She saw DrEllison earlier today for f/u DM w/ RI & PN; on NPH insulin taking 110u daily & labs today showed BS= 148, A1c= 9.7 (it was 8.8 in [place]) and DrEllison is in charge of her DM meds...    She remains on Synthroid150; reminded to take it everyday first thing in the AM on  empty stomach; TSH = 16.91 and it seems unlikely that 133mcg isn't enough for her, reminded to take it every day & bring all med bottles to every office visit...    She saw Nephrology last 10/13 [facility]) w/ Stage3 CKD; Creat stable ~1.7 & calcGFR ~30; no further suggestions & disch from her clinic; labs today showed Cr= 1.7, stable...    Anxiety> on Alpraz0.25 prn...    She has Anemia of chr dis, Hx low Fe, & B12 defic> w/ Hg in the 10-12 range on Fe-bid & B12 shots monthly; Labs 4/14 showed Hg=11.8, continue same... We reviewed prob list, meds, xrays and labs> see below for updates >>  LABS 4/14:  FLP- Chol ok but TG=240, HDL=27;  Chems- ok x BS=148, Aic=9.7, Cr=1.7;  CBC w/ Hg=11.8;  TSH=16.91;  VitD=34   ~  November 16, 2012:  34mo ROV & [place] reports stable overall- we reviewed the following medical problems during today's office visit >>     HxAB> she gets freq exac & requires antibiotics etc, but not on regular meds...    HBP> on Amlod5, Lasix2; BP= 138/60 & she denies CP, palpit, ch in SOB, edema, etc...    HxCP> on ASA81 & she needs to be more active, incr exercise program...    CHOL> on Lip20 & Feno160; FLP 10/14 showed TChol 171, TG 245, HDL 31, LDL 102... Rec to continue same + better low fat diet.    DM> on NovolinN per DrEllison- currently 120u Qam & 10upm; Labs 10/14 showed BS=124 & A1c=8.0    Hypothy> on Synthroid150; Labs 10/14 showed TSH=18.39 & DrEllison is aware, pt clinically euthyroid; reminded to take it everyday first thing in the AM on empty  stomach.    GI- GERD, Divertics> on Zantac150; she denies abd pain, dysphagia, n/v, d/c, blood seen...    Renal Insiuffic> she has seen [place] for Nephrology; stable off nephrotoxic meds; Creat~1.6-1.7 range & stable...    DJD, LBP, Osteopenia> on calcium, MVI, Vit D supplement; uses Tylenol prn pain; notes discomfort in hands etc...    Anxiety> prev on Alpraz0.25 as needed...    Anemia, B12 Defic> on Fe325mg /d & B12 injection monthly; labs reviewed in Epic. We reviewed prob list, meds, xrays and labs> see below for updates >> She received the 2014 Flu vaccine today & a B12 shot...  LABS 101/14:  FLPo on Lip20+Feno160 but TG=245 & needs better low fat diet;  Chems- ok x BS=124 A1c=8.0 Creat=1.6;  CBC- ok w/ Hg=12.1;  TSH=18.39 on 157mcg/d & reminded to take in AM on empty stomach every day!           Problem List:  Hx of ASTHMATIC BRONCHITIS, ACUTE (ICD-466.0) - no recent bronchitic episodes... the increased activity has really helped her... ~  CXR 5/12 showed clear lungs x mild scarring left base & apical pleural thickening... ~  CXR 6/13 showed norm heart size, min streaky atx at basesw/ sl peribronchial thickening c/w bronchitis, otherw clear, NAD...  HYPERTENSION (ICD-401.9) - prev on Diovan/HCT but this was stopped 4/12 Hosp & now on NORVASC 5mg /d... ~  10/12:  BP= 128/62 & she denies CP, palpit, syncope, SOB, edema... ~  2/13:  BP= 130/70 & she feels well & denies symptoms... ~  4/13:  DrGoldsborough started LASIX 20mg /d as needed for swelling... ~  6/13:  BP= 120/60 & she notes some CP from her cough, but no palpit/ ch in SOB, etc... ~  8/13:  BP= 130/60 & although she  c/o left CWP, she denies palpit, ch in SOB, recent edema, etc... ~  12/13: on Amlod5, Lasix20mg  prn swelling; BP= 142/70 & she denies CP, palpit, ch in SOB, edema, etc... ~  4/14:  on Amlod5, Lasix20mg  prn swelling; BP= 120/66 & she denies CP, palpit, ch in SOB, edema, etc... ~  10/14: on Amlod5, Lasix2; BP=  138/60 & she denies CP, palpit, ch in SOB, edema, etc...  Hx of CHEST PAIN (ICD-786.50) - on ASA 81mg /d;  she continues to experience intermittent sharp left CWP- Rx w/ Tylenol Prn. ~  cath 1988 showed normal coronaries, min MVP... ~  Cards eval 4/12 by DrNishan> mult risk factors, atypCP, EKG w/ NSSTTWA, planned Myoview but not done due to subseq hosp 4/12... ~  8/13:  She is c/o left CWP- sharp, tender in left axillary area, etc; discussed rest, heat, Tylenol (she does not want stronger pain med).  DM (ICD-250.00) - followed by DrEllison> DM w/ Nephropathy & Neuropathy> on HUMULIN N 100u daily; (off Metformin rx since 4/12 hosp)... ~  labs 8/08 showed BS= 113, HgA1c=5.4.Marland KitchenMarland Kitchen On Metform + Glimep. ~  labs 4/09 showed BS= 99, HgA1c= 5.5.Marland KitchenMarland Kitchen Stopped Glimep. ~  labs 4/10 showed BS= 125, A1c= 7.1.Marland Kitchen. rec> better diet, same meds for now. ~  labs 4/11 showed BS= 266, A1c= 9.1.Marland KitchenMarland Kitchen what happened? Add Glimep1mg /d back. ~  labs 6/11 showed BS= 195, A1c= 7.9.Marland KitchenMarland Kitchen incr Glimep to 2mg /d. ~  labs 10/11 showed BS= 175, A1c= 7.9.Marland KitchenMarland Kitchen rec incr Gimep to 4mg /d. ~  Labs in [facility] 5/12 showed BS= 232==>139; A1c=9.7; Metform stopped in hosp, on [place]. ~  Labs 5/12 post hosp showed BS= 255, A1c= 8.6.Marland KitchenMarland Kitchen Referred to Endocrinology. ~  7/12:  BS at home all in the 200s, she doesn't want additional meds, wants to wait for appt w/ DrKerr 7/24. ~  8/12:  She was seen by DrKerr & her started her on Levemir insulin + Onglyza 2.5mg /d ==> his notes & labs are reviewed... ~  10/12: Note from DrKerr> pt on Levemir 80u + Onglyza 2.5mg /d w/ A1c= 9.0.Marland Kitchen. ~  Labs here 2/13 showed FBS= 229, A1c= 11.0 & we will fax to DrKerr for his review... ~  Pt switched to DrEllison in the interval> meds changed to HUMULIN N & now on 100u daily w/ recent A1c=8.9.Marland Kitchen. ~  Labs 8/13 on HumulinN100/d showed BS= 89, A1c= 8.8... We reviewed diet, exercise, & f/u w/ DrEllison. ~  4/14: on NPH insulin taking 110u daily & labs today showed BS= 148, A1c= 9.7 (it  was 8.8 in [place]) and DrEllison is in charge of her DM meds. ~  10/14: on NovolinN per DrEllison- currently 120u Qam & 10upm; Labs 10/14 showed BS=124 & A1c=8.0.Marland KitchenMarland Kitchen Med adjustments per DrEllison.  HYPERLIPIDEMIA (ICD-272.4) - on LIPITOR 20mg /d & FENOFIBRATE 160mg /d (ch from Lopid 10/10). ~  FLP 8/08 showed TChol 129, TG 197, HDL 17, LDL 73... despite taking statin & fibrate drugs. ~  FLP 4/09 on Lip20+Tric145 showed TChol 127, TG 269, HDL 13, LDL 69... rec> ch Tricor to [phone]. ~  [place] 10/09 on Lip20+Trilip135 showed TChol 124, TG 247, HDL 10!, LDL 64... rec> ch Trilip to LopidBid. ~  FLP 4/10 on Lip20+LopidBid showed TChol 174, TG 239, HDL 38, LDL 103... rec> ch Lopid to Feno160 (she never did). ~  FLP 10/10 showed TChol 134, TG 256, HDL 10, LDL 76... change the Lopid to Fenofib160/d. ~  FLP 4/11 on Lip20+Feno160 showed TChol 163, TG 234, HDL 28, LDL 108... keep  same, better diet! ~  FLP 10/11 on Lip20+Feno160 showed TChol 172, Tg 304, HDL 26, LDL 112... take meds regularly & better diet. ~  FLP 4/12 on Lip20+Feno160 showed TChol 160, TG 258, HDL 19, LDL 100... Needs better low fat diet. ~  FLP 2/13 on Lip20+Feno160 showed TChol 147, TG 165, HDL 30, LDL 85 ~  FLP 8/13 on Lip20+Feno160 showed TChol 159, TG 232, HDL 30, LDL 99 ~  FLP 4/14 on Lip20+Feno160 showed TChol 163, TG 240, HDL 27, LDL 101 ~  FLP 10/14 on Lip20+Feno160 showed TChol 171, TG 245, HDL 31, LDL 102... Rec to continue same + better low fat diet.   HYPOTHYROIDISM (ICD-244.9) - Hx remote Thyroid surg & scar in neck> on SYNTHROID replacement. ~  last TSH 1/08 = 0.41 ~  labs 4/09 showed TSH= 2.35 ~  labs 10/09 on Levothy125 showed TSH= 0.97 ~  labs 4/10 on Levothy125 showed TSH= 0.08... rec> decr [phone]. ~  labs 10/10 on Levothy112 showed TSH= 2.18... keep same. ~  labs 4/11 on Levo112 showed TSH= 0.51 ~  Labs 4/12 on Levo112 showed TSH= 1.02 ~  Labs 2/13 on Levo112 showed TSH= 20.8.Marland KitchenMarland Kitchen Why? Pharm confirms regular refills,  we will incr dose to 171mcg/d. ~  Labs 8/13 on Levo125 showed TSH= 20.2.Marland KitchenMarland Kitchen We decided to incr LEVOTHYROID 156mcg/d... ~  Labs 4/14 on Levo150 showed TSH= 16.91... This dose should be adeq & rec to take 1st thing in AM on empty stomach etc... ~  Labs 10/14 on Levothy150 showed TSH= 18.39... Rec to take in AM on empty stomach & every day...  GERD (ICD-530.81) - takes [place] 40mg  Prn (states Omep20 "makes me sick")...  ~  EGD was planned in 2006 eval but never done... ~  6/13:  She want to switch to ZANTAC 150mg /d since Nexium is too expensive  DIVERTICULOSIS OF COLON (ICD-562.10) - c/o constip & rec> Miralax + Senakot-S... ~  last colonoscopy 3/06 by DrDBrodie showed divertics only...  RENAL INSUFFICIENCY (ICD-588.9) - prev Creat in the 1.7 - 2.0 range... ~  labs 8/08 showed BUN=26, Creat=1.5 ~  labs 4/09 showed BUN= 34, Creat= 1.5 ~  labs 10/09 showed BUN= 31, Creat= 1.5 ~  labs 4/10 showed BUN= 28, Creat= 1.0, Microalb= pos... continue Rx (on Diovan). ~  labs 10/10 showed BUN= 22, Creat= 1.2 ~  labs 4/11 showed BUN= 20, Creat= 1.5 ~  labs 6/11 showed BUN= 20, Creat= 1.3 ~  labs 10/11 showed BUN= 27, Creat= 1.5 ~  Renal Ultrasound 5/12 was neg- NAD, no hydronephrosis... ~  Labs in [facility] 5/12 showed Creat= 2.11==>1.4 ~  Labs 5/12 post hosp showed BUN=44, Creat=1.7, K=5.7, HCO3=23;  She has appt w/ Nephrology set up by the Hospitalists. ~  6/12 & 10/12:  Pt seen by DrGoldsborough & her notes are reviewed... ~  Labs 2/13 showed BUN= 33, Creat= 1.6 ~  Labs 8/13 showed BUN= 34, Creat= 1.7 ~  10/13: she had f/u DrGoldborough> Cr stable at 1.7, due to HBP + DM, no other rev causes found, released from her clinic... ~  Labs 4/14 showed BUN= 27, Creat= 1.7 ~  Labs 10/14 showed BUN= 26, Cr= 1.6  DEGENERATIVE JOINT DISEASE, GENERALIZED (ICD-715.00) - she uses Tylenol Prn. LOW BACK PAIN (ICD-724.2) OSTEOPOROSIS (ICD-733.00) - she takes calcium + vits daily... ~  BMD here 2/06 showed TScores -1.4  Spine,  -1.1 left FemNeck... ~  labs 4/09 showed Vit D level= 24... rec OTC Vit D 1000 u daily, but  she stopped this. ~  labs 4/10 showed Vit D level = 20... rec> incr OTC Vit D 2000 u daily (she never did). ~  labs 4/11 showed Vit D level = 22...Marland KitchenMarland KitchenMarland Kitchen rec> incr to 2000 u daily. ~  Labs 4/12 showed Vit D level = 32 ~  Labs 4/14 showed Vit D level = 34, rec to continue supplements...  HEADACHE (ICD-784.0)  ANXIETY (ICD-300.00) - she notes her Bro has emphysema, lung cancer and CHF... she takes ALPRAZOLAM 0.5mg  Prn.  ANEMIA>  Hx ACD & IronDefic>  Rx FeSO4 + VitC ~  Hx of multifactorial anemia w/ Hg=11 in 2005, Fe was OK, Stools neg for blood, Folate norm, B12 found low, & Creat 1.5+ ~  labs 1/08 showed Hg=11.1 ~  labs 8/08 showed Hg=9.9 ~  labs 4/09 showed Hg= 9.6, Fe=102, TIBC=344, Sat=21% ~  labs 10/09 showed Hg= 9.1, MCV= 92 ~  labs 4/10 showed = 11.1, Fe= 71 ~  labs 10/10 showed Hg= 9.3, MCV= 93... rec Fe Bid w/ Vit C... ~  labs 4/11 showed Hg= 11.8, MCV=87, Fe=86 ~  labs 10/11 showed Hg= 10.9, MCV= 89 ~  Labs in [facility] 5/12 showed Hg= 10.3==>8.6, Fe=19 (5%sat), B12=505, Folate=16, SPE= neg. ~  Labs 2/13 showed Hg= 12.0 ~  Labs 4/14 showed Hg= 11.8 ~  Labs 10/14 showed Hg= 12.1  VITAMIN B12 DEFICIENCY (ICD-266.2) - Vit B12 level was 77 in 2006 w/ pos parietal cell antibodies; she's been on B12 shots ever since then...  ~  f/u B12 level 4/10 = >1500 ~  f/u B12 level 5/12= 505 ~  She remains on monthly Vit B12 shots...   Past Surgical History  Procedure Laterality Date  . Appendectomy      Outpatient Encounter Prescriptions as of 11/16/2012  Medication Sig Dispense Refill  . amLODipine (NORVASC) 5 MG tablet TAKE 1 TABLET (5 MG TOTAL) BY MOUTH DAILY.  30 tablet  6  . aspirin 81 MG tablet Take 81 mg by mouth daily.        Marland Kitchen atorvastatin (LIPITOR) 20 MG tablet TAKE 1 TABLET EVERY DAY  30 tablet  11  . BAYER CONTOUR TEST test strip 1 EACH BY OTHER ROUTE 4 (FOUR) TIMES DAILY AS NEEDED  FOR OTHER. AND LANCETS 2/DAT 250.03  150 each  6  . Cholecalciferol (VITAMIN D3) 3000 UNITS TABS Take 2 capsules by mouth daily.       . cyanocobalamin (,VITAMIN B-12,) 1000 MCG/ML injection INJECT 1 INTRAMUSCULARLY ONCE A MONTH  1 mL  4  . fenofibrate 160 MG tablet TAKE 1 TABLET EVERY DAY  90 tablet  3  . ferrous sulfate 325 (65 FE) MG EC tablet Take 325 mg by mouth 2 (two) times daily.       . furosemide (LASIX) 20 MG tablet Take 1 tablet (20 mg total) by mouth daily.  30 tablet  11  . GNP LANCETS MICRO THIN 33G MISC Use as directed  100 each  6  . Insulin Isophane Human (HUMULIN N PEN) 100 UNIT/ML SUPN Inject 120 Units into the skin every morning. And 10 units before supper.  15 pen  10  . Insulin Pen Needle 31G X 8 MM MISC Use as directed with the humulin pen insulin  50 each  6  . levothyroxine (SYNTHROID, LEVOTHROID) 150 MCG tablet TAKE 1 TABLET (150 MCG TOTAL) BY MOUTH DAILY.  30 tablet  11  . NEEDLE, DISP, 25 G (BD DISP NEEDLES) 25G X 5/8" MISC Use  as directed to administer  the b12 injection monthly  25 each  0  . ranitidine (ZANTAC) 150 MG capsule TAKE 1 CAPSULE (150 MG TOTAL) BY MOUTH EVERY EVENING.  30 capsule  10  . [DISCONTINUED] insulin NPH (HUMULIN N) 100 UNIT/ML injection Take 120 units in the morning and 10 units before supper.  1 vial  12  . [DISCONTINUED] levofloxacin (LEVAQUIN) 500 MG tablet Take 1 tablet (500 mg total) by mouth daily.  7 tablet  0   No facility-administered encounter medications on file as of 11/16/2012.    Allergies  Allergen Reactions  . Codeine     REACTION: change in mental status  . Diphenhydramine Hcl     REACTION: itching  . Quinine     REACTION: abnormal sensations in face  . Sulfonamide Derivatives     REACTION: rash    Current Medications, Allergies, Past Medical History, Past Surgical History, Family History, and Social History were reviewed in [employer] record.    Review of Systems        See HPI - all other  systems neg except as noted... The patient complains of nausea & diarrhea- now resolved.  The patient denies anorexia, fever, weight loss, weight gain, vision loss, decreased hearing, hoarseness, syncope, peripheral edema, prolonged cough, headaches, hemoptysis, abdominal pain, melena, hematochezia, severe indigestion/heartburn, hematuria, incontinence, suspicious skin lesions, transient blindness, depression, unusual weight change, abnormal bleeding, enlarged lymph nodes, and angioedema.     Objective:   Physical Exam      WD, WN, 79 y/o WF in NAD...  VS reviewed & BP= 120/64 w/o postural changes... GENERAL:  Alert & oriented; pleasant & cooperative... HEENT:  [place]/AT, EOM-wnl, PERRLA, EACs-clear, TMs- sl red on left, NOSE-clear, THROAT-clear & wnl. NECK:  Decr ROM; no JVD; normal carotid impulses w/o bruits; +thyroid scar, no thyromegaly or nodules palpated; no lymphadenopathy. CHEST:  Clear to P & A; without wheezes/ rales/ or rhonchi heard; +tender chest wall & pectoralis area. HEART:  Regular Rhythm; without murmurs/ rubs/ or gallops detected... ABDOMEN:  Soft & nontender; normal bowel sounds; no organomegaly or masses palpated... EXT: without deformities, mild arthritic changes; no varicose veins/ +venous insuffic/ no edema. NEURO:  CN's intact; no focal neuro deficits or weakness found... DERM:  no lesions noted...  RADIOLOGY DATA:  Reviewed in the EPIC EMR & discussed w/ the patient...   LABORATORY DATA:  Reviewed in the EPIC EMR & discussed w/ the patient...   Assessment & Plan:    AB>  No recent infectious exac; not on regular meds...  Hypertension>  Her BP remains controlled on Amlodipine monotherapy (+Lasix20), & specifically her BP is not too low & there are no postural changes;  Continue same med...  Hx of CP>  She was seen 05/13/10 for a routine 64mo check up & daugh mentioned her atypCP & wanted cardiac eval & seen by DrNishan; EKG w/ poor R progression &  NSSTTWA....  Diabetes>  Now followed & managed by DrEllison on HUMULIN N at 130u daily;   Hyperlipidemia>  On Lipitor & Fenofibrate, needs better low fat diet, continue same meds...  Renal Insuffic>  Followed by DrGoldsborough & Creat is stable at 1.6.Marland KitchenMarland Kitchen  Hypothyroid>  TSH suddenly incr to ~20 on 146mcg/d, the dose grad incr to 118mcg/d; TSH still ~16 but clinically stable & we will not titrate further, encouraged to take med daily...  Anemia>  Multifactorial, on B12 shots monthly & Fe/ VitC supplementation...  Anxiety>  She stopped  her prn Alprazolam...  Infected sebaceous cyst on back> resolved.Marland KitchenMarland Kitchen

## 2012-11-16 NOTE — Patient Instructions (Addendum)
Today we updated your med list in our EPIC system...    Continue your current medications the same...  Today we did your follow up FASTING blood work...    We will contact you w/ the results when available...     Results will also be avail to drEllison when you see him next week...  Keep up the good work w/ diet & exercise...  Call for any questions...  Let's plan a follow up visit in 44mo, sooner if needed for problems.Marland KitchenMarland Kitchen

## 2012-11-23 ENCOUNTER — Encounter: Payer: Self-pay | Admitting: Endocrinology

## 2012-11-23 ENCOUNTER — Ambulatory Visit (INDEPENDENT_AMBULATORY_CARE_PROVIDER_SITE_OTHER): Payer: Medicare Other | Admitting: Endocrinology

## 2012-11-23 VITALS — BP 150/80 | HR 84 | Wt 177.0 lb

## 2012-11-23 DIAGNOSIS — E1029 Type 1 diabetes mellitus with other diabetic kidney complication: Secondary | ICD-10-CM

## 2012-11-23 MED ORDER — INSULIN ISOPHANE HUMAN 100 UNIT/ML KWIKPEN: 140.0000 [IU] | PEN_INJECTOR | SUBCUTANEOUS | Status: DC

## 2012-11-23 NOTE — Progress Notes (Signed)
Subjective:    Patient ID: Jocelyn Mendez, female    DOB: 1930/10/10, 79 y.o.   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   no cbg record, but she states cbg's vary from 45 (only 1 reading below 70--this was in am) up to mid-100's (afternoon).  However, she says there is usually no trend throughout the day.   Past Medical History  Diagnosis Date  . Acute bronchitis   . Unspecified essential hypertension   . Chest pain, unspecified   . Type II or unspecified type diabetes mellitus without mention of complication, not stated as uncontrolled   . Other and unspecified hyperlipidemia   . Unspecified hypothyroidism   . Esophageal reflux   . Diverticulosis of colon (without mention of hemorrhage)   . Unspecified disorder resulting from impaired renal function   . Generalized osteoarthrosis, unspecified site   . Lumbago   . Osteoporosis   . Headache(784.0)   . Anxiety state, unspecified   . Anemia of other chronic disease   . Other B-complex deficiencies     Past Surgical History  Procedure Laterality Date  . Appendectomy      History   Social History  . Marital Status: Divorced    Spouse Name: N/A    Number of Children: 6  . Years of Education: N/A   Occupational History  . Not on file.   Social History Main Topics  . Smoking status: Former Smoker -- 0.50 packs/day for 5 years    Types: Cigarettes    Quit date: 02/10/1973  . Smokeless tobacco: Former Systems developer    Types: Snuff    Quit date: 04/07/2010  . Alcohol Use: No  . Drug Use: No  . Sexual Activity: Not on file   Other Topics Concern  . Not on file   Social History Narrative   1 child passed away at 63 weeks old---?crib death    Current Outpatient Prescriptions on File Prior to Visit   Medication Sig Dispense Refill  . amLODipine (NORVASC) 5 MG tablet TAKE 1 TABLET (5 MG TOTAL) BY MOUTH DAILY.  30 tablet  6  . aspirin 81 MG tablet Take 81 mg by mouth daily.        Marland Kitchen atorvastatin (LIPITOR) 20 MG tablet TAKE 1 TABLET EVERY DAY  30 tablet  11  . BAYER CONTOUR TEST test strip 1 EACH BY OTHER ROUTE 4 (FOUR) TIMES DAILY AS NEEDED FOR OTHER. AND LANCETS 2/DAT 250.03  150 each  6  . Cholecalciferol (VITAMIN D3) 3000 UNITS TABS Take 2 capsules by mouth daily.       . cyanocobalamin (,VITAMIN B-12,) 1000 MCG/ML injection INJECT 1 INTRAMUSCULARLY ONCE A MONTH  1 mL  4  . fenofibrate 160 MG tablet TAKE 1 TABLET EVERY DAY  90 tablet  3  . ferrous sulfate 325 (65 FE) MG EC tablet Take 325 mg by mouth 2 (two) times daily.       . furosemide (LASIX) 20 MG tablet Take 1 tablet (20 mg total) by mouth daily.  30 tablet  11  . GNP LANCETS MICRO THIN 33G MISC Use as directed  100 each  6  . Insulin Pen Needle 31G X 8 MM MISC Use as directed with the humulin pen insulin  50  each  6  . levothyroxine (SYNTHROID, LEVOTHROID) 150 MCG tablet TAKE 1 TABLET (150 MCG TOTAL) BY MOUTH DAILY.  30 tablet  11  . NEEDLE, DISP, 25 G (BD DISP NEEDLES) 25G X 5/8" MISC Use as directed to administer  the b12 injection monthly  25 each  0  . ranitidine (ZANTAC) 150 MG capsule TAKE 1 CAPSULE (150 MG TOTAL) BY MOUTH EVERY EVENING.  30 capsule  10  . [DISCONTINUED] insulin glargine (LANTUS SOLOSTAR) 100 UNIT/ML injection Inject 100 Units into the skin every morning.  45 mL  PRN   No current facility-administered medications on file prior to visit.    Allergies  Allergen Reactions  . Codeine     REACTION: change in mental status  . Diphenhydramine Hcl     REACTION: itching  . Quinine     REACTION: abnormal sensations in face  . Sulfonamide Derivatives     REACTION: rash   Family History  Problem Relation Age of Onset  . Diabetes Mother    BP 150/80  Pulse 84  Wt 177 lb (80.287 kg)  BMI 28.58 kg/m2   SpO2 95%  Review of Systems Denies LOC and weight change.    Objective:   Physical Exam VITAL SIGNS:  See vs page GENERAL: no distress  Lab Results  Component Value Date   HGBA1C 8.0* 11/16/2012      Assessment & Plan:  DM: This insulin regimen was chosen from multiple options, for its simplicity.  The benefits of glycemic control must be weighed against the risks of hypoglycemia.   She needs increased rx. OA: this limits exercise rx of DM.

## 2012-11-23 NOTE — Patient Instructions (Addendum)
check your blood sugar twice a day.  vary the time of day when you check, between before the 3 meals, and at bedtime.  also check if you have symptoms of your blood sugar being too high or too low.  please keep a record of the readings and bring it to your next appointment here.  You can write it on any piece of paper.  please call us sooner if your blood sugar goes below 70, or if you have a lot of readings over 200.   Please come back for a follow-up appointment in 3 months.   Please increase the insulin to 140 units each morning, and 5 units each evening

## 2012-12-08 ENCOUNTER — Other Ambulatory Visit: Payer: Self-pay | Admitting: Pulmonary Disease

## 2012-12-09 ENCOUNTER — Telehealth: Payer: Self-pay | Admitting: Pulmonary Disease

## 2012-12-13 NOTE — Telephone Encounter (Signed)
Form has been filled out and placed on SN cart to be signed.

## 2012-12-17 NOTE — Telephone Encounter (Signed)
Called and spoke with pt and she is aware that the form for the new DM machine will be sent to her pharmacy. Nothing further is needed.

## 2013-02-14 ENCOUNTER — Encounter: Payer: Self-pay | Admitting: Endocrinology

## 2013-02-14 ENCOUNTER — Ambulatory Visit (INDEPENDENT_AMBULATORY_CARE_PROVIDER_SITE_OTHER): Payer: Medicare Other | Admitting: Endocrinology

## 2013-02-14 VITALS — BP 118/60 | HR 78 | Temp 97.5°F | Ht 66.0 in | Wt 175.0 lb

## 2013-02-14 DIAGNOSIS — E1065 Type 1 diabetes mellitus with hyperglycemia: Principal | ICD-10-CM

## 2013-02-14 DIAGNOSIS — E1029 Type 1 diabetes mellitus with other diabetic kidney complication: Secondary | ICD-10-CM

## 2013-02-14 DIAGNOSIS — E538 Deficiency of other specified B group vitamins: Secondary | ICD-10-CM

## 2013-02-14 LAB — HEMOGLOBIN A1C: HEMOGLOBIN A1C: 7 % — AB (ref 4.6–6.5)

## 2013-02-14 MED ORDER — INSULIN ISOPHANE HUMAN 100 UNIT/ML KWIKPEN: 145.0000 [IU] | PEN_INJECTOR | SUBCUTANEOUS | Status: DC

## 2013-02-14 NOTE — Patient Instructions (Addendum)
check your blood sugar twice a day.  vary the time of day when you check, between before the 3 meals, and at bedtime.  also check if you have symptoms of your blood sugar being too high or too low.  please keep a record of the readings and bring it to your next appointment here.  You can write it on any piece of paper.  please call us sooner if your blood sugar goes below 70, or if you have a lot of readings over 200.   blood tests are being requested for you today.  We'll contact you with results. Please come back for a follow-up appointment in 3 months.   Please change the insulin to 145 units daily, all in the morning.

## 2013-02-14 NOTE — Progress Notes (Signed)
Subjective:    Patient ID: Jocelyn Mendez, female    DOB: 1930/10/25, 80 y.o.   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   no cbg record, but she states cbg's vary from 38 (at 2 am) up to mid-100's (afternoon).  pt states she feels well in general. Past Medical History  Diagnosis Date  . Acute bronchitis   . Unspecified essential hypertension   . Chest pain, unspecified   . Type II or unspecified type diabetes mellitus without mention of complication, not stated as uncontrolled   . Other and unspecified hyperlipidemia   . Unspecified hypothyroidism   . Esophageal reflux   . Diverticulosis of colon (without mention of hemorrhage)   . Unspecified disorder resulting from impaired renal function   . Generalized osteoarthrosis, unspecified site   . Lumbago   . Osteoporosis   . Headache(784.0)   . Anxiety state, unspecified   . Anemia of other chronic disease   . Other B-complex deficiencies     Past Surgical History  Procedure Laterality Date  . Appendectomy      History   Social History  . Marital Status: Divorced    Spouse Name: N/A    Number of Children: 6  . Years of Education: N/A   Occupational History  . Not on file.   Social History Main Topics  . Smoking status: Former Smoker -- 0.50 packs/day for 5 years    Types: Cigarettes    Quit date: 02/10/1973  . Smokeless tobacco: Former Systems developer    Types: Snuff    Quit date: 04/07/2010  . Alcohol Use: No  . Drug Use: No  . Sexual Activity: Not on file   Other Topics Concern  . Not on file   Social History Narrative   1 child passed away at 60 weeks old---?crib death    Current Outpatient Prescriptions on File Prior to Visit  Medication Sig Dispense Refill  .  amLODipine (NORVASC) 5 MG tablet TAKE 1 TABLET (5 MG TOTAL) BY MOUTH DAILY.  30 tablet  6  . aspirin 81 MG tablet Take 81 mg by mouth daily.        Marland Kitchen atorvastatin (LIPITOR) 20 MG tablet TAKE 1 TABLET EVERY DAY  30 tablet  11  . BAYER CONTOUR TEST test strip 1 EACH BY OTHER ROUTE 4 (FOUR) TIMES DAILY AS NEEDED FOR OTHER. AND LANCETS 2/DAT 250.03  150 each  6  . Cholecalciferol (VITAMIN D3) 3000 UNITS TABS Take 2 capsules by mouth daily.       . cyanocobalamin (,VITAMIN B-12,) 1000 MCG/ML injection INJECT 1 INTRAMUSCULARLY ONCE A MONTH  1 mL  4  . fenofibrate 160 MG tablet TAKE 1 TABLET EVERY DAY  90 tablet  3  . ferrous sulfate 325 (65 FE) MG EC tablet Take 325 mg by mouth 2 (two) times daily.       . furosemide (LASIX) 20 MG tablet Take 1 tablet (20 mg total) by mouth daily.  30 tablet  11  . Insulin Pen Needle 31G X 8 MM MISC Use as directed with the humulin pen insulin  50 each  6  . levothyroxine (SYNTHROID, LEVOTHROID) 150 MCG tablet TAKE 1 TABLET (150 MCG TOTAL) BY MOUTH DAILY.  30 tablet  11  . NEEDLE, DISP, 25 G (BD DISP NEEDLES) 25G X 5/8" MISC Use as directed to administer  the b12 injection monthly  25 each  0  . ranitidine (ZANTAC) 150 MG capsule TAKE 1 CAPSULE (150 MG TOTAL) BY MOUTH EVERY EVENING.  30 capsule  10  . [DISCONTINUED] insulin glargine (LANTUS SOLOSTAR) 100 UNIT/ML injection Inject 100 Units into the skin every morning.  45 mL  PRN   No current facility-administered medications on file prior to visit.    Allergies  Allergen Reactions  . Codeine     REACTION: change in mental status  . Diphenhydramine Hcl     REACTION: itching  . Quinine     REACTION: abnormal sensations in face  . Sulfonamide Derivatives     REACTION: rash    Family History  Problem Relation Age of Onset  . Diabetes Mother     BP 118/60  Pulse 78  Temp(Src) 97.5 F (36.4 C) (Oral)  Ht 5\' 6"  (1.676 m)  Wt 175 lb (79.379 kg)  BMI 28.26 kg/m2  SpO2 95%  Review of Systems Denies LOC.   She has lost a few lbs.        Objective:   Physical Exam VITAL SIGNS:  See vs page GENERAL: no distress  Lab Results  Component Value Date   HGBA1C 7.0* 02/14/2013      Assessment & Plan:  DM: Based on the pattern of her cbg's, she needs some adjustment in her therapy OA: this limits exercise rx of DM.  Renal insufficiency: this increases the risk of hypoglycemia

## 2013-02-15 ENCOUNTER — Telehealth: Payer: Self-pay | Admitting: Pulmonary Disease

## 2013-02-15 MED ORDER — CYANOCOBALAMIN 1000 MCG/ML IJ SOLN
1000.0000 ug | Freq: Once | INTRAMUSCULAR | Status: AC
  Administered 2013-02-14: 1000 ug via INTRAMUSCULAR

## 2013-02-15 MED ORDER — ONETOUCH ULTRASOFT LANCETS MISC: Status: DC

## 2013-02-15 NOTE — Telephone Encounter (Signed)
Called and spoke with pts daughter and she stated that the pt is needing the lancets for the one touch ultra.  These have been sent to the pharmacy and she is aware.  She will call back for any other concerns.

## 2013-02-28 ENCOUNTER — Telehealth: Payer: Self-pay | Admitting: Pulmonary Disease

## 2013-02-28 MED ORDER — LEVOFLOXACIN 500 MG PO TABS: 500.0000 mg | ORAL_TABLET | Freq: Every day | ORAL | Status: DC

## 2013-02-28 NOTE — Telephone Encounter (Signed)
Spoke with patient- states she started having deep productive cough yesterday-phelgm has now turned yellow/green in color and thick; also c/o fever/chills(no way to check for fevers at home); has SOB and wheezing as well. Please advise SN. Thanks  Allergies  Allergen Reactions  . Codeine     REACTION: change in mental status  . Diphenhydramine Hcl     REACTION: itching  . Quinine     REACTION: abnormal sensations in face  . Sulfonamide Derivatives     REACTION: rash

## 2013-02-28 NOTE — Telephone Encounter (Signed)
Per SN---  Sounds like acute bronchitis.    Call in:  levaquin 500 mg  #7  1 daily Probiotic daily mucinex 2 po bid Plenty of fluids

## 2013-02-28 NOTE — Telephone Encounter (Signed)
atc na x1 

## 2013-02-28 NOTE — Telephone Encounter (Signed)
Called spoke with patient, advised of SN's recommendations as stated below.  Advised of Levaquin rx, probiotic, mucinex 2 po bid and push fluids.  Advised pt to call or seek emergency assistance if symptoms do not improve or they worsen.  Pt verbalized her understanding and denied any questions.  Rx sent to verified pharmacy.  Will sign off.

## 2013-03-02 ENCOUNTER — Emergency Department (HOSPITAL_COMMUNITY): Payer: Medicare Other

## 2013-03-02 ENCOUNTER — Inpatient Hospital Stay (HOSPITAL_COMMUNITY)
Admission: EM | Admit: 2013-03-02 | Discharge: 2013-03-09 | DRG: 871 | Disposition: A | Discharge status: Home / Self Care | Payer: Medicare Other | Attending: Internal Medicine | Admitting: Internal Medicine

## 2013-03-02 ENCOUNTER — Encounter (HOSPITAL_COMMUNITY): Payer: Self-pay | Admitting: Emergency Medicine

## 2013-03-02 DIAGNOSIS — M159 Polyosteoarthritis, unspecified: Secondary | ICD-10-CM | POA: Diagnosis present

## 2013-03-02 DIAGNOSIS — J9600 Acute respiratory failure, unspecified whether with hypoxia or hypercapnia: Secondary | ICD-10-CM | POA: Diagnosis present

## 2013-03-02 DIAGNOSIS — J101 Influenza due to other identified influenza virus with other respiratory manifestations: Secondary | ICD-10-CM

## 2013-03-02 DIAGNOSIS — E782 Mixed hyperlipidemia: Secondary | ICD-10-CM | POA: Diagnosis present

## 2013-03-02 DIAGNOSIS — D638 Anemia in other chronic diseases classified elsewhere: Secondary | ICD-10-CM | POA: Diagnosis present

## 2013-03-02 DIAGNOSIS — N189 Chronic kidney disease, unspecified: Secondary | ICD-10-CM

## 2013-03-02 DIAGNOSIS — N184 Chronic kidney disease, stage 4 (severe): Secondary | ICD-10-CM | POA: Diagnosis present

## 2013-03-02 DIAGNOSIS — J111 Influenza due to unidentified influenza virus with other respiratory manifestations: Secondary | ICD-10-CM

## 2013-03-02 DIAGNOSIS — Z833 Family history of diabetes mellitus: Secondary | ICD-10-CM

## 2013-03-02 DIAGNOSIS — E1142 Type 2 diabetes mellitus with diabetic polyneuropathy: Secondary | ICD-10-CM | POA: Diagnosis present

## 2013-03-02 DIAGNOSIS — Z7982 Long term (current) use of aspirin: Secondary | ICD-10-CM

## 2013-03-02 DIAGNOSIS — Z79899 Other long term (current) drug therapy: Secondary | ICD-10-CM

## 2013-03-02 DIAGNOSIS — N179 Acute kidney failure, unspecified: Secondary | ICD-10-CM

## 2013-03-02 DIAGNOSIS — E119 Type 2 diabetes mellitus without complications: Secondary | ICD-10-CM

## 2013-03-02 DIAGNOSIS — J09X2 Influenza due to identified novel influenza A virus with other respiratory manifestations: Secondary | ICD-10-CM | POA: Diagnosis present

## 2013-03-02 DIAGNOSIS — K219 Gastro-esophageal reflux disease without esophagitis: Secondary | ICD-10-CM | POA: Diagnosis present

## 2013-03-02 DIAGNOSIS — I519 Heart disease, unspecified: Secondary | ICD-10-CM | POA: Diagnosis present

## 2013-03-02 DIAGNOSIS — R509 Fever, unspecified: Secondary | ICD-10-CM

## 2013-03-02 DIAGNOSIS — I10 Essential (primary) hypertension: Secondary | ICD-10-CM | POA: Diagnosis present

## 2013-03-02 DIAGNOSIS — J209 Acute bronchitis, unspecified: Secondary | ICD-10-CM

## 2013-03-02 DIAGNOSIS — R6510 Systemic inflammatory response syndrome (SIRS) of non-infectious origin without acute organ dysfunction: Secondary | ICD-10-CM

## 2013-03-02 DIAGNOSIS — Z87891 Personal history of nicotine dependence: Secondary | ICD-10-CM

## 2013-03-02 DIAGNOSIS — R69 Illness, unspecified: Secondary | ICD-10-CM

## 2013-03-02 DIAGNOSIS — M810 Age-related osteoporosis without current pathological fracture: Secondary | ICD-10-CM | POA: Diagnosis present

## 2013-03-02 DIAGNOSIS — I129 Hypertensive chronic kidney disease with stage 1 through stage 4 chronic kidney disease, or unspecified chronic kidney disease: Secondary | ICD-10-CM | POA: Diagnosis present

## 2013-03-02 DIAGNOSIS — M79609 Pain in unspecified limb: Secondary | ICD-10-CM | POA: Diagnosis present

## 2013-03-02 DIAGNOSIS — A419 Sepsis, unspecified organism: Principal | ICD-10-CM | POA: Diagnosis present

## 2013-03-02 DIAGNOSIS — E1149 Type 2 diabetes mellitus with other diabetic neurological complication: Secondary | ICD-10-CM | POA: Diagnosis present

## 2013-03-02 DIAGNOSIS — E785 Hyperlipidemia, unspecified: Secondary | ICD-10-CM | POA: Diagnosis present

## 2013-03-02 DIAGNOSIS — R609 Edema, unspecified: Secondary | ICD-10-CM | POA: Diagnosis present

## 2013-03-02 DIAGNOSIS — F411 Generalized anxiety disorder: Secondary | ICD-10-CM | POA: Diagnosis present

## 2013-03-02 DIAGNOSIS — R000 Tachycardia, unspecified: Secondary | ICD-10-CM | POA: Diagnosis present

## 2013-03-02 DIAGNOSIS — Z794 Long term (current) use of insulin: Secondary | ICD-10-CM

## 2013-03-02 DIAGNOSIS — N183 Chronic kidney disease, stage 3 unspecified: Secondary | ICD-10-CM | POA: Diagnosis present

## 2013-03-02 DIAGNOSIS — J441 Chronic obstructive pulmonary disease with (acute) exacerbation: Secondary | ICD-10-CM | POA: Diagnosis present

## 2013-03-02 DIAGNOSIS — E039 Hypothyroidism, unspecified: Secondary | ICD-10-CM | POA: Diagnosis present

## 2013-03-02 DIAGNOSIS — Z888 Allergy status to other drugs, medicaments and biological substances status: Secondary | ICD-10-CM

## 2013-03-02 DIAGNOSIS — Z885 Allergy status to narcotic agent status: Secondary | ICD-10-CM

## 2013-03-02 DIAGNOSIS — I440 Atrioventricular block, first degree: Secondary | ICD-10-CM | POA: Diagnosis present

## 2013-03-02 DIAGNOSIS — J9601 Acute respiratory failure with hypoxia: Secondary | ICD-10-CM

## 2013-03-02 HISTORY — DX: Type 2 diabetes mellitus with diabetic neuropathy, unspecified: E11.40

## 2013-03-02 LAB — URINALYSIS W MICROSCOPIC + REFLEX CULTURE
Bilirubin Urine: NEGATIVE
Glucose, UA: NEGATIVE mg/dL
Hgb urine dipstick: NEGATIVE
Ketones, ur: NEGATIVE mg/dL
Leukocytes, UA: NEGATIVE
NITRITE: NEGATIVE
Protein, ur: 100 mg/dL — AB
SPECIFIC GRAVITY, URINE: 1.017 (ref 1.005–1.030)
UROBILINOGEN UA: 0.2 mg/dL (ref 0.0–1.0)
pH: 6 (ref 5.0–8.0)

## 2013-03-02 LAB — COMPREHENSIVE METABOLIC PANEL
ALBUMIN: 4 g/dL (ref 3.5–5.2)
ALK PHOS: 35 U/L — AB (ref 39–117)
ALT: 21 U/L (ref 0–35)
AST: 38 U/L — ABNORMAL HIGH (ref 0–37)
BUN: 25 mg/dL — AB (ref 6–23)
CO2: 20 mEq/L (ref 19–32)
Calcium: 9.7 mg/dL (ref 8.4–10.5)
Chloride: 101 mEq/L (ref 96–112)
Creatinine, Ser: 2.03 mg/dL — ABNORMAL HIGH (ref 0.50–1.10)
GFR calc Af Amer: 25 mL/min — ABNORMAL LOW (ref >90)
GFR calc non Af Amer: 22 mL/min — ABNORMAL LOW (ref >90)
Glucose, Bld: 116 mg/dL — ABNORMAL HIGH (ref 70–99)
POTASSIUM: 4.5 meq/L (ref 3.7–5.3)
SODIUM: 137 meq/L (ref 137–147)
Total Bilirubin: 0.4 mg/dL (ref 0.3–1.2)
Total Protein: 8.2 g/dL (ref 6.0–8.3)

## 2013-03-02 LAB — CBC WITH DIFFERENTIAL/PLATELET
BASOS PCT: 0 % (ref 0–1)
Basophils Absolute: 0 10*3/uL (ref 0.0–0.1)
EOS ABS: 0.2 10*3/uL (ref 0.0–0.7)
Eosinophils Relative: 3 % (ref 0–5)
HCT: 38.6 % (ref 36.0–46.0)
Hemoglobin: 13 g/dL (ref 12.0–15.0)
Lymphocytes Relative: 11 % — ABNORMAL LOW (ref 12–46)
Lymphs Abs: 0.7 10*3/uL (ref 0.7–4.0)
MCH: 32.3 pg (ref 26.0–34.0)
MCHC: 33.7 g/dL (ref 30.0–36.0)
MCV: 95.8 fL (ref 78.0–100.0)
Monocytes Absolute: 0.7 10*3/uL (ref 0.1–1.0)
Monocytes Relative: 10 % (ref 3–12)
Neutro Abs: 5.1 10*3/uL (ref 1.7–7.7)
Neutrophils Relative %: 76 % (ref 43–77)
PLATELETS: 156 10*3/uL (ref 150–400)
RBC: 4.03 MIL/uL (ref 3.87–5.11)
RDW: 13.2 % (ref 11.5–15.5)
WBC: 6.6 10*3/uL (ref 4.0–10.5)

## 2013-03-02 LAB — LACTIC ACID, PLASMA: Lactic Acid, Venous: 1.3 mmol/L (ref 0.5–2.2)

## 2013-03-02 LAB — LIPASE, BLOOD: LIPASE: 22 U/L (ref 11–59)

## 2013-03-02 LAB — TROPONIN I: Troponin I: <0.3 ng/mL (ref <0.30)

## 2013-03-02 LAB — GLUCOSE, CAPILLARY
GLUCOSE-CAPILLARY: 108 mg/dL — AB (ref 70–99)
Glucose-Capillary: 137 mg/dL — ABNORMAL HIGH (ref 70–99)

## 2013-03-02 MED ORDER — ACETAMINOPHEN 325 MG PO TABS
650.0000 mg | ORAL_TABLET | Freq: Once | ORAL | Status: AC
Start: 2013-03-02 — End: 2013-03-02
  Administered 2013-03-02: 650 mg via ORAL
  Filled 2013-03-02: qty 2

## 2013-03-02 MED ORDER — PIPERACILLIN-TAZOBACTAM 3.375 G IVPB 30 MIN
3.3750 g | Freq: Once | INTRAVENOUS | Status: AC
  Administered 2013-03-02: 3.375 g via INTRAVENOUS
  Filled 2013-03-02: qty 50

## 2013-03-02 MED ORDER — SODIUM CHLORIDE 0.9 % IJ SOLN
3.0000 mL | Freq: Two times a day (BID) | INTRAMUSCULAR | Status: DC
  Administered 2013-03-02 – 2013-03-05 (×5): 3 mL via INTRAVENOUS
  Administered 2013-03-06: 11:00:00 via INTRAVENOUS
  Administered 2013-03-06 – 2013-03-09 (×6): 3 mL via INTRAVENOUS

## 2013-03-02 MED ORDER — ATORVASTATIN CALCIUM 20 MG PO TABS
20.0000 mg | ORAL_TABLET | Freq: Every day | ORAL | Status: DC
  Administered 2013-03-02 – 2013-03-08 (×7): 20 mg via ORAL
  Filled 2013-03-02 (×8): qty 1

## 2013-03-02 MED ORDER — INSULIN NPH (HUMAN) (ISOPHANE) 100 UNIT/ML ~~LOC~~ SUSP
70.0000 [IU] | Freq: Every day | SUBCUTANEOUS | Status: DC
  Administered 2013-03-04: 70 [IU] via SUBCUTANEOUS
  Filled 2013-03-02: qty 10

## 2013-03-02 MED ORDER — ASPIRIN 81 MG PO TABS: 81.0000 mg | ORAL_TABLET | Freq: Every day | ORAL | Status: DC

## 2013-03-02 MED ORDER — ACETAMINOPHEN 650 MG RE SUPP: 650.0000 mg | Freq: Four times a day (QID) | RECTAL | Status: DC | PRN

## 2013-03-02 MED ORDER — PIPERACILLIN-TAZOBACTAM 3.375 G IVPB 30 MIN: 3.3750 g | Freq: Three times a day (TID) | INTRAVENOUS | Status: DC

## 2013-03-02 MED ORDER — SODIUM CHLORIDE 0.9 % IV SOLN
INTRAVENOUS | Status: DC
  Administered 2013-03-02: 75 mL via INTRAVENOUS

## 2013-03-02 MED ORDER — BRIMONIDINE TARTRATE 0.2 % OP SOLN
1.0000 [drp] | Freq: Two times a day (BID) | OPHTHALMIC | Status: DC
  Administered 2013-03-02 – 2013-03-07 (×10): 1 [drp] via OPHTHALMIC
  Filled 2013-03-02: qty 5

## 2013-03-02 MED ORDER — ONDANSETRON HCL 4 MG PO TABS
4.0000 mg | ORAL_TABLET | Freq: Four times a day (QID) | ORAL | Status: DC | PRN
Start: 2013-03-02 — End: 2013-03-09

## 2013-03-02 MED ORDER — FERROUS SULFATE 325 (65 FE) MG PO TABS
325.0000 mg | ORAL_TABLET | Freq: Two times a day (BID) | ORAL | Status: DC
  Administered 2013-03-03 – 2013-03-09 (×12): 325 mg via ORAL
  Filled 2013-03-02 (×16): qty 1

## 2013-03-02 MED ORDER — NON FORMULARY: 1.0000 [drp] | Freq: Two times a day (BID) | Status: DC

## 2013-03-02 MED ORDER — VANCOMYCIN HCL IN DEXTROSE 1-5 GM/200ML-% IV SOLN
1000.0000 mg | INTRAVENOUS | Status: AC
  Administered 2013-03-02: 1000 mg via INTRAVENOUS
  Filled 2013-03-02: qty 200

## 2013-03-02 MED ORDER — ACETAMINOPHEN 325 MG PO TABS
650.0000 mg | ORAL_TABLET | Freq: Four times a day (QID) | ORAL | Status: DC | PRN
  Filled 2013-03-02: qty 2

## 2013-03-02 MED ORDER — OSELTAMIVIR PHOSPHATE 30 MG PO CAPS
30.0000 mg | ORAL_CAPSULE | Freq: Every day | ORAL | Status: DC
  Administered 2013-03-02 – 2013-03-03 (×2): 30 mg via ORAL
  Filled 2013-03-02 (×2): qty 1

## 2013-03-02 MED ORDER — FENOFIBRATE 160 MG PO TABS
160.0000 mg | ORAL_TABLET | Freq: Every day | ORAL | Status: DC
  Administered 2013-03-03 – 2013-03-09 (×7): 160 mg via ORAL
  Filled 2013-03-02 (×7): qty 1

## 2013-03-02 MED ORDER — INSULIN ASPART 100 UNIT/ML ~~LOC~~ SOLN
0.0000 [IU] | Freq: Three times a day (TID) | SUBCUTANEOUS | Status: DC
  Administered 2013-03-03: 2 [IU] via SUBCUTANEOUS
  Administered 2013-03-04: 3 [IU] via SUBCUTANEOUS
  Administered 2013-03-04: 2 [IU] via SUBCUTANEOUS
  Administered 2013-03-05: 3 [IU] via SUBCUTANEOUS
  Administered 2013-03-05: 11 [IU] via SUBCUTANEOUS

## 2013-03-02 MED ORDER — BRINZOLAMIDE 1 % OP SUSP
1.0000 [drp] | Freq: Two times a day (BID) | OPHTHALMIC | Status: DC
  Administered 2013-03-02 – 2013-03-08 (×10): 1 [drp] via OPHTHALMIC
  Filled 2013-03-02 (×2): qty 10

## 2013-03-02 MED ORDER — HEPARIN SODIUM (PORCINE) 5000 UNIT/ML IJ SOLN
5000.0000 [IU] | Freq: Three times a day (TID) | INTRAMUSCULAR | Status: DC
  Administered 2013-03-02 – 2013-03-09 (×20): 5000 [IU] via SUBCUTANEOUS
  Filled 2013-03-02 (×23): qty 1

## 2013-03-02 MED ORDER — PIPERACILLIN-TAZOBACTAM 3.375 G IVPB
3.3750 g | Freq: Three times a day (TID) | INTRAVENOUS | Status: DC
  Administered 2013-03-03 – 2013-03-06 (×11): 3.375 g via INTRAVENOUS
  Filled 2013-03-02 (×13): qty 50

## 2013-03-02 MED ORDER — ONDANSETRON HCL 4 MG/2ML IJ SOLN: 4.0000 mg | Freq: Four times a day (QID) | INTRAMUSCULAR | Status: DC | PRN

## 2013-03-02 MED ORDER — LEVOTHYROXINE SODIUM 150 MCG PO TABS
150.0000 ug | ORAL_TABLET | Freq: Every day | ORAL | Status: DC
  Administered 2013-03-03 – 2013-03-09 (×6): 150 ug via ORAL
  Filled 2013-03-02 (×8): qty 1

## 2013-03-02 MED ORDER — ASPIRIN 81 MG PO CHEW
81.0000 mg | CHEWABLE_TABLET | Freq: Every day | ORAL | Status: DC
  Administered 2013-03-03 – 2013-03-09 (×7): 81 mg via ORAL
  Filled 2013-03-02 (×7): qty 1

## 2013-03-02 MED ORDER — AMLODIPINE BESYLATE 5 MG PO TABS
5.0000 mg | ORAL_TABLET | Freq: Every day | ORAL | Status: DC
  Administered 2013-03-03 – 2013-03-09 (×7): 5 mg via ORAL
  Filled 2013-03-02 (×8): qty 1

## 2013-03-02 MED ORDER — SODIUM CHLORIDE 0.9 % IV SOLN
INTRAVENOUS | Status: DC
Start: 2013-03-02 — End: 2013-03-04
  Administered 2013-03-02 – 2013-03-04 (×2): via INTRAVENOUS

## 2013-03-02 MED ORDER — VANCOMYCIN HCL IN DEXTROSE 750-5 MG/150ML-% IV SOLN
750.0000 mg | INTRAVENOUS | Status: DC
  Administered 2013-03-03 – 2013-03-05 (×3): 750 mg via INTRAVENOUS
  Filled 2013-03-02 (×4): qty 150

## 2013-03-02 MED ORDER — INSULIN ASPART 100 UNIT/ML ~~LOC~~ SOLN
0.0000 [IU] | Freq: Every day | SUBCUTANEOUS | Status: DC
  Administered 2013-03-05: 5 [IU] via SUBCUTANEOUS

## 2013-03-02 MED ORDER — OSELTAMIVIR PHOSPHATE 75 MG PO CAPS: 75.0000 mg | ORAL_CAPSULE | Freq: Every day | ORAL | Status: DC

## 2013-03-02 MED ORDER — FERROUS SULFATE 325 (65 FE) MG PO TBEC: 325.0000 mg | DELAYED_RELEASE_TABLET | Freq: Two times a day (BID) | ORAL | Status: DC

## 2013-03-02 NOTE — ED Provider Notes (Signed)
CSN: [id number]     Arrival date & time 03/02/13  1003 History   First MD Initiated Contact with Patient 03/02/13 1008     Chief Complaint  Patient presents with  . Cough  . Fatigue    HPI Pt was seen at 1010. Per pt and her family, c/o gradual onset and worsening of persistent generalized weakness/fatigue for the past 2 days. Has been associated with nausea, cough and subjective home fevers/chills. Family states they called her PMD who called in prescriptions for an antibiotic and mucinex. Pt states her symptoms continue to worsen. Family feels pt has had intermittent "confusion." Denies syncope, no CP/palpitations, no SOB, no abd pain, no vomiting/diarrhea, no black or blood in stools.     Past Medical History  Diagnosis Date  . Acute bronchitis   . Unspecified essential hypertension   . Chest pain, unspecified   . Type II or unspecified type diabetes mellitus without mention of complication, not stated as uncontrolled   . Other and unspecified hyperlipidemia   . Unspecified hypothyroidism   . Esophageal reflux   . Diverticulosis of colon (without mention of hemorrhage)   . Unspecified disorder resulting from impaired renal function   . Generalized osteoarthrosis, unspecified site   . Lumbago   . Osteoporosis   . Headache(784.0)   . Anxiety state, unspecified   . Anemia of other chronic disease   . Other B-complex deficiencies   . Neuropathy in diabetes     bilat LE's   Past Surgical History  Procedure Laterality Date  . Appendectomy     Family History  Problem Relation Age of Onset  . Diabetes Mother    History  Substance Use Topics  . Smoking status: Former Smoker -- 0.50 packs/day for 5 years    Types: Cigarettes    Quit date: 02/10/1973  . Smokeless tobacco: Former Systems developer    Types: Snuff    Quit date: 04/07/2010  . Alcohol Use: No    Review of Systems ROS: Statement: All systems negative except as marked or noted in the HPI; Constitutional: +fever and chills,  generalized weakness/fatigue.. ; ; Eyes: Negative for eye pain, redness and discharge. ; ; ENMT: Negative for ear pain, hoarseness, nasal congestion, sinus pressure and sore throat. ; ; Cardiovascular: Negative for chest pain, palpitations, diaphoresis, dyspnea and peripheral edema. ; ; Respiratory: +cough. Negative for wheezing and stridor. ; ; Gastrointestinal: +nausea. Negative for vomiting, diarrhea, abdominal pain, blood in stool, hematemesis, jaundice and rectal bleeding. . ; ; Genitourinary: Negative for dysuria, flank pain and hematuria. ; ; Musculoskeletal: Negative for back pain and neck pain. Negative for swelling and trauma.; ; Skin: Negative for pruritus, rash, abrasions, blisters, bruising and skin lesion.; ; Neuro: +intermittent confusion. Negative for headache, lightheadedness and neck stiffness. Negative for altered level of consciousness , altered mental status, extremity weakness, paresthesias, involuntary movement, seizure and syncope.       Allergies  Codeine; Diphenhydramine hcl; Quinine; and Sulfonamide derivatives  Home Medications   Current Outpatient Rx  Name  Route  Sig  Dispense  Refill  . amLODipine (NORVASC) 5 MG tablet   Oral   Take 5 mg by mouth daily.         Marland Kitchen aspirin 81 MG tablet   Oral   Take 81 mg by mouth daily.           Marland Kitchen atorvastatin (LIPITOR) 20 MG tablet   Oral   Take 20 mg by mouth at bedtime.         Marland Kitchen  Brinzolamide-Brimonidine (SIMBRINZA) 1-0.2 % SUSP   Both Eyes   Place 1 drop into both eyes 2 (two) times daily.         . Cholecalciferol (VITAMIN D3) 2000 UNITS TABS   Oral   Take 1 tablet by mouth 2 (two) times daily.         . cyanocobalamin (,VITAMIN B-12,) 1000 MCG/ML injection   Intramuscular   Inject 1,000 mcg into the muscle every 30 (thirty) days.         . fenofibrate 160 MG tablet   Oral   Take 160 mg by mouth daily.         . ferrous sulfate 325 (65 FE) MG EC tablet   Oral   Take 325 mg by mouth 2 (two)  times daily.          . furosemide (LASIX) 20 MG tablet   Oral   Take 1 tablet (20 mg total) by mouth daily.   30 tablet   11   . glucose blood test strip   Other   1 each by Other route 4 (four) times daily. Bayer contour test strips         . Insulin Isophane Human (HUMULIN N) 100 UNIT/ML Kiwkpen   Subcutaneous   Inject 145 Units into the skin every morning.   25 pen   11   . Insulin Pen Needle 31G X 8 MM MISC   Does not apply   1 each by Does not apply route. Use with insulin pen device, humulin         . Lancets 30G MISC   Does not apply   1 each by Does not apply route. Use to check blood sugars four times per day         . levofloxacin (LEVAQUIN) 500 MG tablet   Oral   Take 1 tablet (500 mg total) by mouth daily.   7 tablet   0   . levothyroxine (SYNTHROID, LEVOTHROID) 150 MCG tablet   Oral   Take 150 mcg by mouth daily before breakfast.         . NEEDLE, DISP, 25 G (BD DISP NEEDLES) 25G X 5/8" MISC      Use as directed to administer  the b12 injection monthly   25 each   0   . Insulin Pen Needle 31G X 8 MM MISC      Use as directed with the humulin pen insulin   50 each   6   . Lancets (ONETOUCH ULTRASOFT) lancets      Use as instructed   100 each   6    BP 155/44  Pulse 102  Temp(Src) 102.8 F (39.3 C) (Oral)  Resp 18  Ht 5\' 6"  (1.676 m)  Wt 172 lb (78.019 kg)  BMI 27.77 kg/m2  SpO2 94% Physical Exam 1015: Physical examination:  Nursing notes reviewed; Vital signs and O2 SAT reviewed;  Constitutional: Well developed, Well nourished, In no acute distress; Head:  Normocephalic, atraumatic; Eyes: EOMI, PERRL, No scleral icterus; ENMT: Mouth and pharynx normal, Mucous membranes dry; Neck: Supple, Full range of motion, No lymphadenopathy. No meningeal signs.; Cardiovascular: Tachycardic rate and rhythm, No gallop; Respiratory: Breath sounds coarse & equal bilaterally, No wheezes.  Speaking full sentences with ease, Normal respiratory  effort/excursion; Chest: Nontender, Movement normal; Abdomen: Soft, Nontender, Nondistended, Normal bowel sounds; Genitourinary: No CVA tenderness; Extremities: Pulses normal, No tenderness, No edema, No calf edema or asymmetry.; Neuro: AA&Ox3, vague historian.  Major CN grossly intact.  Speech clear. No gross focal motor or sensory deficits in extremities.; Skin: Color normal, Warm, Dry.   ED Course  Procedures   EKG Interpretation    Date/Time:  Wednesday March 02 2013 11:44:13 EST Ventricular Rate:  98 PR Interval:  220 QRS Duration: 90 QT Interval:  329 QTC Calculation: 420 R Axis:   28 Text Interpretation:  Sinus rhythm with first degree AV block Artifact Low voltage, extremity leads Anteroseptal infarct, old Nonspecific T abnormalities, lateral leads When compared with ECG of 06/11/2010 No significant change was found Confirmed by [facility]  MD, Nunzio Cory [phone]) on 03/02/2013 12:10:17 PM            MDM  MDM Reviewed: previous chart, nursing note and vitals Reviewed previous: labs and ECG Interpretation: labs, ECG and x-ray     Results for orders placed during the hospital encounter of 03/02/13  URINALYSIS W MICROSCOPIC + REFLEX CULTURE      Result Value Range   Color, Urine YELLOW  YELLOW   APPearance CLEAR  CLEAR   Specific Gravity, Urine 1.017  1.005 - 1.030   pH 6.0  5.0 - 8.0   Glucose, UA NEGATIVE  NEGATIVE mg/dL   Hgb urine dipstick NEGATIVE  NEGATIVE   Bilirubin Urine NEGATIVE  NEGATIVE   Ketones, ur NEGATIVE  NEGATIVE mg/dL   Protein, ur 100 (*) NEGATIVE mg/dL   Urobilinogen, UA 0.2  0.0 - 1.0 mg/dL   Nitrite NEGATIVE  NEGATIVE   Leukocytes, UA NEGATIVE  NEGATIVE   WBC, UA 0-2  <3 WBC/hpf   RBC / HPF 0-2  <3 RBC/hpf   Squamous Epithelial / LPF RARE  RARE  CBC WITH DIFFERENTIAL      Result Value Range   WBC 6.6  4.0 - 10.5 K/uL   RBC 4.03  3.87 - 5.11 MIL/uL   Hemoglobin 13.0  12.0 - 15.0 g/dL   HCT 38.6  36.0 - 46.0 %   MCV 95.8  78.0 - 100.0 fL    MCH 32.3  26.0 - 34.0 pg   MCHC 33.7  30.0 - 36.0 g/dL   RDW 13.2  11.5 - 15.5 %   Platelets 156  150 - 400 K/uL   Neutrophils Relative % 76  43 - 77 %   Neutro Abs 5.1  1.7 - 7.7 K/uL   Lymphocytes Relative 11 (*) 12 - 46 %   Lymphs Abs 0.7  0.7 - 4.0 K/uL   Monocytes Relative 10  3 - 12 %   Monocytes Absolute 0.7  0.1 - 1.0 K/uL   Eosinophils Relative 3  0 - 5 %   Eosinophils Absolute 0.2  0.0 - 0.7 K/uL   Basophils Relative 0  0 - 1 %   Basophils Absolute 0.0  0.0 - 0.1 K/uL  COMPREHENSIVE METABOLIC PANEL      Result Value Range   Sodium 137  137 - 147 mEq/L   Potassium 4.5  3.7 - 5.3 mEq/L   Chloride 101  96 - 112 mEq/L   CO2 20  19 - 32 mEq/L   Glucose, Bld 116 (*) 70 - 99 mg/dL   BUN 25 (*) 6 - 23 mg/dL   Creatinine, Ser 2.03 (*) 0.50 - 1.10 mg/dL   Calcium 9.7  8.4 - 10.5 mg/dL   Total Protein 8.2  6.0 - 8.3 g/dL   Albumin 4.0  3.5 - 5.2 g/dL   AST 38 (*) 0 - 37 U/L  ALT 21  0 - 35 U/L   Alkaline Phosphatase 35 (*) 39 - 117 U/L   Total Bilirubin 0.4  0.3 - 1.2 mg/dL   GFR calc non Af Amer 22 (*) >90 mL/min   GFR calc Af Amer 25 (*) >90 mL/min  LIPASE, BLOOD      Result Value Range   Lipase 22  11 - 59 U/L  TROPONIN I      Result Value Range   Troponin I <0.30  <0.30 ng/mL  LACTIC ACID, PLASMA      Result Value Range   Lactic Acid, Venous 1.3  0.5 - 2.2 mmol/L   Dg Chest 2 View 03/02/2013   CLINICAL DATA:  Cough and congestion.  EXAM: CHEST  2 VIEW  COMPARISON:  Chest x-ray 08/03/2011.  FINDINGS: Linear opacity left base is unchanged compared to the prior study, compatible with an area mild scarring. Lung volumes are low. No acute consolidative airspace disease. No pleural effusions. No evidence of pulmonary edema. Heart size is normal. Mediastinal contours are unremarkable. Atherosclerosis in the thoracic aorta.  IMPRESSION: 1. Low lung volumes without radiographic evidence of acute cardiopulmonary disease. 2. Atherosclerosis.   Electronically Signed   By: Vinnie Langton M.D.   On: 03/02/2013 12:49   Results for ALEAH, GRAJEWSKI (MRN [id number]) as of 03/02/2013 13:46  Ref. Range 09/15/2011 12:43 01/19/2012 11:03 05/17/2012 12:51 11/16/2012 11:23 03/02/2013 10:50  BUN Latest Range: 6-23 mg/dL 34 (H) 30 (H) 27 (H) 26 (H) 25 (H)  Creatinine Latest Range: 0.50-1.10 mg/dL 1.7 (H) 1.7 (H) 1.7 (H) 1.6 (H) 2.03 (H)     1315:   BUN/Cr elevated from previous. APAP given for fever with improvement. Not orthostatic on VS. No clear source of infection; will check influenza panel. Dx and testing d/w pt and family.  Questions answered.  Verb understanding, agreeable to admit. T/C to [facility] Dr. Carles Collet, case discussed, including:  HPI, pertinent PM/SHx, VS/PE, dx testing, ED course and treatment:  Agreeable to admit, requests he will come to the ED for eval.     Alfonzo Feller, DO 03/05/13 [phone]

## 2013-03-02 NOTE — Progress Notes (Addendum)
ANTIBIOTIC CONSULT NOTE - INITIAL  Pharmacy Consult for:  Vancomycin Indication:  Empiric therapy for SIRS with influenza-like illness  Allergies  Allergen Reactions  . Codeine     REACTION: change in mental status  . Diphenhydramine Hcl     REACTION: itching  . Quinine     REACTION: abnormal sensations in face  . Sulfonamide Derivatives     REACTION: rash    Patient Measurements: Height: 5\' 6"  (167.6 cm) Weight: 172 lb (78.019 kg) IBW/kg (Calculated) : 59.3  Vital Signs: Temp: 98.1 F (36.7 C) (01/21 1736) Temp src: Oral (01/21 1736) BP: 153/66 mmHg (01/21 1736) Pulse Rate: 91 (01/21 1736)  Labs:  Recent Labs  03/02/13 1050  WBC 6.6  HGB 13.0  PLT 156  CREATININE 2.03*   Estimated Creatinine Clearance: 23.3 ml/min (by C-G formula based on Cr of 2.03).    Medical History: Past Medical History  Diagnosis Date  . Acute bronchitis   . Unspecified essential hypertension   . Chest pain, unspecified   . Type II or unspecified type diabetes mellitus without mention of complication, not stated as uncontrolled   . Other and unspecified hyperlipidemia   . Unspecified hypothyroidism   . Esophageal reflux   . Diverticulosis of colon (without mention of hemorrhage)   . Unspecified disorder resulting from impaired renal function   . Generalized osteoarthrosis, unspecified site   . Lumbago   . Osteoporosis   . Headache(784.0)   . Anxiety state, unspecified   . Anemia of other chronic disease   . Other B-complex deficiencies   . Neuropathy in diabetes     bilat LE's    Medications:  Scheduled:  . [START ON 03/03/2013] amLODipine  5 mg Oral Daily  . [START ON 03/03/2013] aspirin  81 mg Oral Daily  . atorvastatin  20 mg Oral QHS  . brinzolamide  1 drop Both Eyes BID   And  . brimonidine  1 drop Both Eyes BID  . [START ON 03/03/2013] fenofibrate  160 mg Oral Daily  . [START ON 03/03/2013] ferrous sulfate  325 mg Oral BID WC  . heparin  5,000 Units Subcutaneous Q8H   . [START ON 03/03/2013] insulin aspart  0-15 Units Subcutaneous TID WC  . insulin aspart  0-5 Units Subcutaneous QHS  . [START ON 03/03/2013] insulin NPH Human  70 Units Subcutaneous QAC breakfast  . [START ON 03/03/2013] levothyroxine  150 mcg Oral QAC breakfast  . oseltamivir  30 mg Oral Daily  . piperacillin-tazobactam  3.375 g Intravenous Once   Followed by  . [START ON 03/03/2013] piperacillin-tazobactam (ZOSYN)  IV  3.375 g Intravenous Q8H  . sodium chloride  3 mL Intravenous Q12H    Microbiology: Influenza panel by PCR pending  Assessment:  Asked to assist with Vancomycin therapy for this 80 year-old female with SIRS, influenza-like illness, and renal insufficiency.  Zosyn has also been ordered for this patient.  Goals of Therapy:   Vancomycin trough levels 15-20 mcg/ml  Dose adjustment for renal insufficiency  Eradication of infection  Plan:  Vancomycin 1000mg  x 1 dose, then 750 mg IV every 24 hours.  [place]. 03/02/2013 8:38 PM

## 2013-03-02 NOTE — Progress Notes (Signed)
Patient received from ED without a mask, flu status uncertain at this time.  Patient oriented to room, ordering meals, calling nurse/assistance out of bed, etc.  Daughters are present in the room, IV fluids initiated, patient coughing up Carlene Bickley thick sputum

## 2013-03-02 NOTE — ED Notes (Signed)
Pt has been having a productive cough with green sputum x 2 days.  Pt has had fever with this and been feeling generally weak.  Her PCP (didnt see her) but called in a mucinex and a antibiotic (family unsure of name) and she has been taking taking this for 2 days without relief.  Pt reports body aches with this and has had some confusion.

## 2013-03-02 NOTE — Progress Notes (Signed)
[facility] History and Physical  Jocelyn Mendez [id number] DOB: [birth year] DOA: 03/02/2013   PCP: Noralee Space, MD   Chief Complaint: Coughing, shortness of breath, general weakness HPI:  80 year old female with a history of diabetes mellitus2, hyperlipidemia, hypertension, hypothyroidism presents with approximately three-day history of fever, generalized weakness, coughing, shortness of breath. The patient has been having fevers and chills at home without taking her temperature. She has had some coughing with yellow-green sputum. Denies any hemoptysis, chest discomfort, vomiting, diarrhea, abdominal pain, dysuria, hematuria. She has had some nausea without emesis. However, the patient feels hungry presently. She has also been complaining of a headache with myalgias. She called her primary care provider whom started her on Levaquin of which she has taken 2 doses without much relief. In the ED, the patient was noted to have temperature 102.8 and mild tachycardia with a heart rate of 102. The patient was given 1 L of normal saline. BMP was unremarkable except for serum creatinine 2.03. CBC showed borderline platelets of 156,000. WBC was 6.6. Initial troponin was negative. Lactic acid was 1.3. EKG showed sinus rhythm with nonspecific T-wave changes and first degree AV block. Influenza PCR was ordered. Assessment/Plan: SIRS with Influenza Like Illness -Influenza PCR -Start Tamiflu -Blood cultures x2 sets -Start empiric vancomycin and Zosyn pending culture data -Chest x-ray negative for infiltrates -Urinalysis negative for pyuria Acute on chronic renal failure (CKD stage III) -Baseline creatinine 1.6-1.7 -IV fluids Hypertension -Continue amlodipine Diabetes mellitus type 2 -Hemoglobin A1c 7.0 on 02/14/2013 -Start half home dose of NPH Lower extremity pain and edema -Venous duplex rule out DVT      Past Medical History  Diagnosis Date  . Acute bronchitis   . Unspecified  essential hypertension   . Chest pain, unspecified   . Type II or unspecified type diabetes mellitus without mention of complication, not stated as uncontrolled   . Other and unspecified hyperlipidemia   . Unspecified hypothyroidism   . Esophageal reflux   . Diverticulosis of colon (without mention of hemorrhage)   . Unspecified disorder resulting from impaired renal function   . Generalized osteoarthrosis, unspecified site   . Lumbago   . Osteoporosis   . Headache(784.0)   . Anxiety state, unspecified   . Anemia of other chronic disease   . Other B-complex deficiencies   . Neuropathy in diabetes     bilat LE's   Past Surgical History  Procedure Laterality Date  . Appendectomy     Social History:  reports that she quit smoking about 40 years ago. Her smoking use included Cigarettes. She has a 2.5 pack-year smoking history. She quit smokeless tobacco use about 2 years ago. Her smokeless tobacco use included Snuff. She reports that she does not drink alcohol or use illicit drugs.   Family History  Problem Relation Age of Onset  . Diabetes Mother      Allergies  Allergen Reactions  . Codeine     REACTION: change in mental status  . Diphenhydramine Hcl     REACTION: itching  . Quinine     REACTION: abnormal sensations in face  . Sulfonamide Derivatives     REACTION: rash      Prior to Admission medications   Medication Sig Start Date End Date Taking? Authorizing Provider  amLODipine (NORVASC) 5 MG tablet Take 5 mg by mouth daily.   Yes Historical Provider, MD  aspirin 81 MG tablet Take 81 mg by mouth daily.     Yes Historical Provider,  MD  atorvastatin (LIPITOR) 20 MG tablet Take 20 mg by mouth at bedtime.   Yes Historical Provider, MD  Brinzolamide-Brimonidine [facility]) 1-0.2 % SUSP Place 1 drop into both eyes 2 (two) times daily.   Yes Historical Provider, MD  Cholecalciferol (VITAMIN D3) 2000 UNITS TABS Take 1 tablet by mouth 2 (two) times daily.   Yes Historical  Provider, MD  cyanocobalamin (,VITAMIN B-12,) 1000 MCG/ML injection Inject 1,000 mcg into the muscle every 30 (thirty) days.   Yes Historical Provider, MD  fenofibrate 160 MG tablet Take 160 mg by mouth daily.   Yes Historical Provider, MD  ferrous sulfate 325 (65 FE) MG EC tablet Take 325 mg by mouth 2 (two) times daily.    Yes Historical Provider, MD  furosemide (LASIX) 20 MG tablet Take 1 tablet (20 mg total) by mouth daily. 08/05/11  Yes Noralee Space, MD  glucose blood test strip 1 each by Other route 4 (four) times daily. Bayer contour test strips   Yes Historical Provider, MD  Insulin Isophane Human (HUMULIN N) 100 UNIT/ML Kiwkpen Inject 145 Units into the skin every morning. 02/14/13  Yes Renato Shin, MD  Insulin Pen Needle 31G X 8 MM MISC 1 each by Does not apply route. Use with insulin pen device, humulin   Yes Historical Provider, MD  Lancets 30G MISC 1 each by Does not apply route. Use to check blood sugars four times per day   Yes Historical Provider, MD  levofloxacin (LEVAQUIN) 500 MG tablet Take 1 tablet (500 mg total) by mouth daily. 02/28/13  Yes Noralee Space, MD  levothyroxine (SYNTHROID, LEVOTHROID) 150 MCG tablet Take 150 mcg by mouth daily before breakfast.   Yes Historical Provider, MD  NEEDLE, DISP, 25 G (BD DISP NEEDLES) 25G X 5/8" MISC Use as directed to administer  the b12 injection monthly 11/19/11  Yes Noralee Space, MD  Insulin Pen Needle 31G X 8 MM MISC Use as directed with the humulin pen insulin 09/25/11   Noralee Space, MD  Lancets [facility] ULTRASOFT) lancets Use as instructed 02/15/13   Noralee Space, MD    Review of Systems:  Constitutional:  No weight loss, night sweats, Fevers, chills, fatigue.  Head&Eyes: No headache.  No vision loss.  No eye pain or scotoma ENT:  No Difficulty swallowing,Tooth/dental problems,Sore throat,   Cardio-vascular:  No chest pain, Orthopnea, PND, swelling in lower extremities,  dizziness, palpitations  GI:  No  abdominal pain,   vomiting, diarrhea, loss of appetite, hematochezia, melena, heartburn, indigestion, Resp:   No coughing up of blood .No wheezing.No chest wall deformity  Skin:  no rash or lesions.  GU:  no dysuria, change in color of urine, no urgency or frequency. No flank pain.  Musculoskeletal:  No joint pain or swelling. No decreased range of motion. No back pain.  Psych:  No change in mood or affect. No depression or anxiety. Neurologic: no dysesthesia, no focal weakness, no vision loss. No syncope  Physical Exam: Filed Vitals:   03/02/13 1029 03/02/13 1052 03/02/13 1134 03/02/13 1345  BP: 156/48  155/44 132/45  Pulse: 106  102 86  Temp:   102.8 F (39.3 C) 97.9 F (36.6 C)  TempSrc:   Oral Oral  Resp:   18 16  Height:      Weight:      SpO2:  90% 94% 97%   General:  A&O x 3, NAD, nontoxic, pleasant/cooperative Head/Eye: No conjunctival hemorrhage, no icterus, [place]/AT, No  nystagmus ENT:  No icterus,  No thrush, poor dentition, no pharyngeal exudate Neck:  No masses, no lymphadenpathy, no bruits CV:  RRR, no rub, no gallop, no S3 Lung:  CTAB, good air movement, no wheeze, no rhonchi Abdomen: soft/NT, +BS, nondistended, no peritoneal signs Ext: No cyanosis, No rashes, No petechiae, No lymphangitis, trace LE edema   Labs on Admission:  Basic Metabolic Panel:  Recent Labs Lab 03/02/13 1050  NA 137  K 4.5  CL 101  CO2 20  GLUCOSE 116*  BUN 25*  CREATININE 2.03*  CALCIUM 9.7   Liver Function Tests:  Recent Labs Lab 03/02/13 1050  AST 38*  ALT 21  ALKPHOS 35*  BILITOT 0.4  PROT 8.2  ALBUMIN 4.0    Recent Labs Lab 03/02/13 1050  LIPASE 22   No results found for this basename: AMMONIA,  in the last 168 hours CBC:  Recent Labs Lab 03/02/13 1050  WBC 6.6  NEUTROABS 5.1  HGB 13.0  HCT 38.6  MCV 95.8  PLT 156   Cardiac Enzymes:  Recent Labs Lab 03/02/13 1050  TROPONINI <0.30   BNP: No components found with this basename: POCBNP,  CBG: No results  found for this basename: GLUCAP,  in the last 168 hours  Radiological Exams on Admission: Dg Chest 2 View  03/02/2013   CLINICAL DATA:  Cough and congestion.  EXAM: CHEST  2 VIEW  COMPARISON:  Chest x-ray 08/03/2011.  FINDINGS: Linear opacity left base is unchanged compared to the prior study, compatible with an area mild scarring. Lung volumes are low. No acute consolidative airspace disease. No pleural effusions. No evidence of pulmonary edema. Heart size is normal. Mediastinal contours are unremarkable. Atherosclerosis in the thoracic aorta.  IMPRESSION: 1. Low lung volumes without radiographic evidence of acute cardiopulmonary disease. 2. Atherosclerosis.   Electronically Signed   By: Vinnie Langton M.D.   On: 03/02/2013 12:49    EKG: Independently reviewed. Sinus rhythm, first degree AV block, nonspecific T wave change    Time spent:70 minutes Code Status:   FULL Family Communication:   Son and daughter at bedside   Annetta Deiss, DO  [facility] Pager [phone]  If 7PM-7AM, please contact night-coverage [email or web address] Password [email or web address] 03/02/2013, 1:50 PM

## 2013-03-03 ENCOUNTER — Telehealth: Payer: Self-pay | Admitting: Pulmonary Disease

## 2013-03-03 DIAGNOSIS — M79609 Pain in unspecified limb: Secondary | ICD-10-CM

## 2013-03-03 DIAGNOSIS — D638 Anemia in other chronic diseases classified elsewhere: Secondary | ICD-10-CM

## 2013-03-03 DIAGNOSIS — M7989 Other specified soft tissue disorders: Secondary | ICD-10-CM

## 2013-03-03 DIAGNOSIS — N189 Chronic kidney disease, unspecified: Secondary | ICD-10-CM

## 2013-03-03 DIAGNOSIS — N179 Acute kidney failure, unspecified: Secondary | ICD-10-CM

## 2013-03-03 LAB — GLUCOSE, CAPILLARY
GLUCOSE-CAPILLARY: 126 mg/dL — AB (ref 70–99)
GLUCOSE-CAPILLARY: 85 mg/dL (ref 70–99)
Glucose-Capillary: 56 mg/dL — ABNORMAL LOW (ref 70–99)
Glucose-Capillary: 77 mg/dL (ref 70–99)
Glucose-Capillary: 88 mg/dL (ref 70–99)

## 2013-03-03 LAB — CBC
HCT: 35.3 % — ABNORMAL LOW (ref 36.0–46.0)
HEMOGLOBIN: 11.2 g/dL — AB (ref 12.0–15.0)
MCH: 30.5 pg (ref 26.0–34.0)
MCHC: 31.7 g/dL (ref 30.0–36.0)
MCV: 96.2 fL (ref 78.0–100.0)
Platelets: 149 10*3/uL — ABNORMAL LOW (ref 150–400)
RBC: 3.67 MIL/uL — AB (ref 3.87–5.11)
RDW: 13.3 % (ref 11.5–15.5)
WBC: 4.3 10*3/uL (ref 4.0–10.5)

## 2013-03-03 LAB — BASIC METABOLIC PANEL
BUN: 28 mg/dL — ABNORMAL HIGH (ref 6–23)
CO2: 20 mEq/L (ref 19–32)
Calcium: 8.9 mg/dL (ref 8.4–10.5)
Chloride: 103 mEq/L (ref 96–112)
Creatinine, Ser: 2.2 mg/dL — ABNORMAL HIGH (ref 0.50–1.10)
GFR calc Af Amer: 23 mL/min — ABNORMAL LOW (ref >90)
GFR calc non Af Amer: 20 mL/min — ABNORMAL LOW (ref >90)
GLUCOSE: 62 mg/dL — AB (ref 70–99)
POTASSIUM: 4 meq/L (ref 3.7–5.3)
SODIUM: 137 meq/L (ref 137–147)

## 2013-03-03 LAB — INFLUENZA PANEL BY PCR (TYPE A & B)
H1N1 flu by pcr: NOT DETECTED
INFLAPCR: POSITIVE — AB
INFLBPCR: NEGATIVE

## 2013-03-03 MED ORDER — DIPHENOXYLATE-ATROPINE 2.5-0.025 MG PO TABS
1.0000 | ORAL_TABLET | Freq: Four times a day (QID) | ORAL | Status: DC | PRN
  Administered 2013-03-03 – 2013-03-04 (×2): 1 via ORAL
  Filled 2013-03-03 (×2): qty 1

## 2013-03-03 MED ORDER — OSELTAMIVIR PHOSPHATE 75 MG PO CAPS: 75.0000 mg | ORAL_CAPSULE | Freq: Two times a day (BID) | ORAL | Status: DC

## 2013-03-03 MED ORDER — DIPHENOXYLATE-ATROPINE 2.5-0.025 MG/5ML PO LIQD: 5.0000 mL | Freq: Four times a day (QID) | ORAL | Status: DC | PRN

## 2013-03-03 MED ORDER — OSELTAMIVIR PHOSPHATE 30 MG PO CAPS
30.0000 mg | ORAL_CAPSULE | Freq: Every day | ORAL | Status: AC
  Administered 2013-03-04 – 2013-03-06 (×3): 30 mg via ORAL
  Filled 2013-03-03 (×3): qty 1

## 2013-03-03 MED ORDER — GUAIFENESIN 100 MG/5ML PO SYRP
200.0000 mg | ORAL_SOLUTION | ORAL | Status: DC | PRN
  Administered 2013-03-03: 200 mg via ORAL
  Filled 2013-03-03: qty 10

## 2013-03-03 NOTE — Telephone Encounter (Signed)
lmomtcb x1 for carrie

## 2013-03-03 NOTE — Telephone Encounter (Signed)
error 

## 2013-03-03 NOTE — Progress Notes (Signed)
Patient ID: Jocelyn Mendez, female   DOB: [birth year], 80 y.o.   MRN: [id number] [facility] PROGRESS NOTE  Jocelyn Mendez [id number] DOB: 07-22-1930 DOA: 03/02/2013 PCP: Noralee Space, MD  Brief narrative: 80 year old female with a history of diabetes mellitus2, hyperlipidemia, hypertension, hypothyroidism presents with approximately three-day history of fever, generalized weakness, coughing, shortness of breath. The patient has been having fevers and chills at home without taking her temperature. She has had some coughing with yellow-green sputum. Denies any hemoptysis, chest discomfort, vomiting, diarrhea, abdominal pain, dysuria, hematuria. She has had some nausea without emesis. She has also been complaining of a headache with myalgias. She called her primary care provider whom started her on Levaquin of which she has taken 2 doses without much relief.   In the ED, the patient was noted to have temperature 102.8 and mild tachycardia with a heart rate of 102. The patient was given 1 L of normal saline. BMP was unremarkable except for serum creatinine 2.03. CBC showed borderline platelets of 156,000. WBC was 6.6. Initial troponin was negative. Lactic acid was 1.3. EKG showed sinus rhythm with nonspecific T-wave changes and first degree AV block. Influenza PCR was ordered.   Assessment/Plan:  SIRS with Influenza Like Illness  -Influenza PCR positive -Started Tamiflu and will continue  -Blood cultures x2 sets pending  -continue vancomycin and Zosyn pending culture data  -Chest x-ray negative for infiltrates  -Urinalysis negative for pyuria  Acute on chronic renal failure (CKD stage III)  -Baseline creatinine 1.6-1.7  -IV fluids today and plan d/c IVF in AM -BMP pending this AM  Hypertension  -Continue amlodipine  Diabetes mellitus type 2  -Hemoglobin A1c 7.0 on 02/14/2013  -Started half home dose of NPH  -adjust the regimen as indicated  Lower extremity pain and edema   -Venous duplex negative for DVT   Consultants:  None  Procedures/Studies: Dg Chest 2 View   03/02/2013   Low lung volumes without radiographic evidence of acute cardiopulmonary disease.  Atherosclerosis.    Antibiotics/Antivirals:  Vancomycin 01/21 -->  Zosyn 01/21 -->  Tamiflu 01/21 -->  Code Status: Full Family Communication: Pt at bedside Disposition Plan: Home when medically stable  HPI/Subjective: No events overnight.   Objective: Filed Vitals:   03/02/13 1134 03/02/13 1345 03/02/13 1736 03/02/13 2107  BP: 155/44 132/45 153/66 119/68  Pulse: 102 86 91 66  Temp: 102.8 F (39.3 C) 97.9 F (36.6 C) 98.1 F (36.7 C) 98.5 F (36.9 C)  TempSrc: Oral Oral Oral Oral  Resp: 18 16 18 20   Height:      Weight:      SpO2: 94% 97% 94% 99%    Intake/Output Summary (Last 24 hours) at 03/03/13 0553 Last data filed at 03/02/13 1939  Gross per 24 hour  Intake      0 ml  Output    300 ml  Net   -300 ml    Exam:   General:  Pt is alert, follows commands appropriately, not in acute distress  Cardiovascular: Regular rate and rhythm, S1/S2, no murmurs, no rubs, no gallops  Respiratory: Clear to auscultation bilaterally, rhonchi at bases   Abdomen: Soft, non tender, non distended, bowel sounds present, no guarding  Extremities: No edema, pulses DP and PT palpable bilaterally  Data Reviewed: Basic Metabolic Panel:  Recent Labs Lab 03/02/13 1050  NA 137  K 4.5  CL 101  CO2 20  GLUCOSE 116*  BUN 25*  CREATININE 2.03*  CALCIUM 9.7  Liver Function Tests:  Recent Labs Lab 03/02/13 1050  AST 38*  ALT 21  ALKPHOS 35*  BILITOT 0.4  PROT 8.2  ALBUMIN 4.0    Recent Labs Lab 03/02/13 1050  LIPASE 22   CBC:  Recent Labs Lab 03/02/13 1050  WBC 6.6  NEUTROABS 5.1  HGB 13.0  HCT 38.6  MCV 95.8  PLT 156   Cardiac Enzymes:  Recent Labs Lab 03/02/13 1050  TROPONINI <0.30   CBG:  Recent Labs Lab 03/02/13 1729 03/02/13 2111  GLUCAP 108*  137*   Scheduled Meds: . amLODipine  5 mg Oral Daily  . aspirin  81 mg Oral Daily  . atorvastatin  20 mg Oral QHS  . brinzolamide  1 drop Both Eyes BID   And  . brimonidine  1 drop Both Eyes BID  . fenofibrate  160 mg Oral Daily  . ferrous sulfate  325 mg Oral BID WC  . heparin  5,000 Units Subcutaneous Q8H  . insulin aspart  0-15 Units Subcutaneous TID WC  . insulin aspart  0-5 Units Subcutaneous QHS  . insulin NPH Human  70 Units Subcutaneous QAC breakfast  . levothyroxine  150 mcg Oral QAC breakfast  . oseltamivir  30 mg Oral Daily  . piperacillin-tazobactam (ZOSYN)  IV  3.375 g Intravenous Q8H  . sodium chloride  3 mL Intravenous Q12H  . vancomycin  750 mg Intravenous Q24H   Continuous Infusions: . sodium chloride 75 mL/hr at 03/02/13 1052  . sodium chloride 75 mL (03/02/13 1835)   Faye Ramsay, MD  [facility] Pager [phone]  If 7PM-7AM, please contact night-coverage [email or web address] Password [facility] 03/03/2013, 5:53 AM   LOS: 1 day

## 2013-03-03 NOTE — Progress Notes (Signed)
cbgs reported to Dr. Doyle Askew, instructed to omit nph 70  Units this am

## 2013-03-03 NOTE — Progress Notes (Addendum)
Instructed that pcr was positive for flu, reinstructed regarding droplet isolation precautions  Assisted patient to chair to sit up for awhile

## 2013-03-03 NOTE — Progress Notes (Signed)
Inpatient Diabetes Program Recommendations  AACE/ADA: New Consensus Statement on Inpatient Glycemic Control (2013)  Target Ranges:  Prepandial:   less than 140 mg/dL      Peak postprandial:   less than 180 mg/dL (1-2 hours)      Critically ill patients:  140 - 180 mg/dL   Reason for Visit: Results for MCKINLEY, COLN (MRN [id number]) as of 03/03/2013 11:00  Ref. Range 03/03/2013 07:25 03/03/2013 07:40 03/03/2013 08:02  Glucose-Capillary Latest Range: 70-99 mg/dL 56 (L) 77 88  Results for KEYATTA, WATERHOUSE (MRN [id number]) as of 03/03/2013 11:00  Ref. Range 02/14/2013 11:10  Hemoglobin A1C Latest Range: 4.6-6.5 % 7.0 (H)   Note hypoglycemia this morning.  Home meds for patient include: NPH 145 units daily.  In the hospital she is ordered, NPH 70 units q AM and Novolog moderate tid with meals and HS scale.  MD called by nursing regarding low CBG and order received to hold AM dose of NPH.  Will follow. May need further reduction in NPH dose.  Adah Perl, RN, BC-ADM Inpatient Diabetes Coordinator Pager [phone]

## 2013-03-03 NOTE — Progress Notes (Signed)
Patient states rectal area/perineum sore with some excoriation from diarrhea today.  Given barrier cream to use but requesting an anti diarrheal.  Dr. Doyle Askew text paged

## 2013-03-03 NOTE — Progress Notes (Signed)
*  Preliminary Results* Bilateral lower extremity venous duplex completed. Bilateral lower extremities are negative for deep vein thrombosis. There is no evidence of Baker's cyst bilaterally.  03/03/2013  Maudry Mayhew, RVT, RDCS, RDMS

## 2013-03-04 LAB — GLUCOSE, CAPILLARY
GLUCOSE-CAPILLARY: 124 mg/dL — AB (ref 70–99)
GLUCOSE-CAPILLARY: 155 mg/dL — AB (ref 70–99)
Glucose-Capillary: 150 mg/dL — ABNORMAL HIGH (ref 70–99)
Glucose-Capillary: 66 mg/dL — ABNORMAL LOW (ref 70–99)

## 2013-03-04 LAB — BASIC METABOLIC PANEL
BUN: 29 mg/dL — AB (ref 6–23)
CALCIUM: 9 mg/dL (ref 8.4–10.5)
CO2: 18 mEq/L — ABNORMAL LOW (ref 19–32)
CREATININE: 2.34 mg/dL — AB (ref 0.50–1.10)
Chloride: 104 mEq/L (ref 96–112)
GFR calc Af Amer: 21 mL/min — ABNORMAL LOW (ref >90)
GFR, EST NON AFRICAN AMERICAN: 19 mL/min — AB (ref >90)
Glucose, Bld: 157 mg/dL — ABNORMAL HIGH (ref 70–99)
Potassium: 4.8 mEq/L (ref 3.7–5.3)
Sodium: 137 mEq/L (ref 137–147)

## 2013-03-04 LAB — CBC
HEMATOCRIT: 34.3 % — AB (ref 36.0–46.0)
Hemoglobin: 11.4 g/dL — ABNORMAL LOW (ref 12.0–15.0)
MCH: 32 pg (ref 26.0–34.0)
MCHC: 33.2 g/dL (ref 30.0–36.0)
MCV: 96.3 fL (ref 78.0–100.0)
PLATELETS: 143 10*3/uL — AB (ref 150–400)
RBC: 3.56 MIL/uL — ABNORMAL LOW (ref 3.87–5.11)
RDW: 13.4 % (ref 11.5–15.5)
WBC: 4 10*3/uL (ref 4.0–10.5)

## 2013-03-04 MED ORDER — FUROSEMIDE 20 MG PO TABS
20.0000 mg | ORAL_TABLET | Freq: Every day | ORAL | Status: DC
  Administered 2013-03-04 – 2013-03-05 (×2): 20 mg via ORAL
  Filled 2013-03-04 (×2): qty 1

## 2013-03-04 MED ORDER — INSULIN NPH (HUMAN) (ISOPHANE) 100 UNIT/ML ~~LOC~~ SUSP
50.0000 [IU] | Freq: Every day | SUBCUTANEOUS | Status: DC
  Administered 2013-03-05: 50 [IU] via SUBCUTANEOUS
  Filled 2013-03-04: qty 10

## 2013-03-04 NOTE — Progress Notes (Addendum)
Patient ID: Jocelyn Mendez, female   DOB: 10/19/1930, 80 y.o.   MRN: [id number]  [facility] PROGRESS NOTE  BYRNECE SCHEIBNER [id number] DOB: 1930-11-24 DOA: 03/02/2013 PCP: Noralee Space, MD  Brief narrative:  80 year old female with a history of diabetes mellitus2, hyperlipidemia, hypertension, hypothyroidism presents with approximately three-day history of fever, generalized weakness, coughing, shortness of breath. The patient has been having fevers and chills at home without taking her temperature. She has had some coughing with yellow-green sputum. Denies any hemoptysis, chest discomfort, vomiting, diarrhea, abdominal pain, dysuria, hematuria. She has had some nausea without emesis. She has also been complaining of a headache with myalgias. She called her primary care provider whom started her on Levaquin of which she has taken 2 doses without much relief.   In the ED, the patient was noted to have temperature 102.8 and mild tachycardia with a heart rate of 102. The patient was given 1 L of normal saline. BMP was unremarkable except for serum creatinine 2.03. CBC showed borderline platelets of 156,000. WBC was 6.6. Initial troponin was negative. Lactic acid was 1.3. EKG showed sinus rhythm with nonspecific T-wave changes and first degree AV block. Influenza PCR was ordered.   Assessment/Plan:  SIRS with Influenza Like Illness  -Influenza PCR positive  -Started Tamiflu and will continue  -Blood cultures x2 sets pending but negative to date -pt clinically improving  -continue vancomycin and Zosyn pending culture data  -Chest x-ray negative for infiltrates  -Urinalysis negative for pyuria  Acute on chronic renal failure (CKD stage III)  -Baseline creatinine 1.6-1.7, Cr is trending up   -d/c IVF to avoid volume overload, close monitoring of renal function  -BMP in AM Hypertension  -Continue amlodipine  Diabetes mellitus type 2  -Hemoglobin A1c 7.0 on 02/14/2013  -Started half  home dose of NPH but may need to lower more as pt has had hypoglycemic events  -adjust the regimen as indicated  Lower extremity pain and edema  -Venous duplex negative for DVT   Consultants:  None Procedures/Studies:  Dg Chest 2 View 03/02/2013 Low lung volumes without radiographic evidence of acute cardiopulmonary disease. Atherosclerosis.  Antibiotics/Antivirals:  Vancomycin 01/21 -->  Zosyn 01/21 -->  Tamiflu 01/21 -->  Code Status: Full  Family Communication: Pt at bedside  Disposition Plan: PT evaluation pending    HPI/Subjective: No events overnight.   Objective: Filed Vitals:   03/03/13 1315 03/03/13 2200 03/04/13 0600 03/04/13 1337  BP: 145/68 132/71 117/69 112/62  Pulse: 72 61 60 62  Temp: 98 F (36.7 C) 98 F (36.7 C) 98.6 F (37 C) 97.9 F (36.6 C)  TempSrc: Oral Oral Oral Oral  Resp: 18 18 18 2   Height:      Weight:      SpO2: 99% 100% 99% 100%    Intake/Output Summary (Last 24 hours) at 03/04/13 1729 Last data filed at 03/04/13 1447  Gross per 24 hour  Intake    480 ml  Output      0 ml  Net    480 ml    Exam:   General:  Pt is alert, follows commands appropriately, not in acute distress  Cardiovascular: Regular rate and rhythm, S1/S2, no murmurs, no rubs, no gallops  Respiratory: Clear to auscultation bilaterally, no wheezing, bibasilar rhonchi   Abdomen: Soft, non tender, non distended, bowel sounds present, no guarding  Extremities: No edema, pulses DP and PT palpable bilaterally  Neuro: Grossly nonfocal  Data Reviewed: Basic Metabolic Panel:  Recent  Labs Lab 03/02/13 1050 03/03/13 0550 03/04/13 0600  NA 137 137 137  K 4.5 4.0 4.8  CL 101 103 104  CO2 20 20 18*  GLUCOSE 116* 62* 157*  BUN 25* 28* 29*  CREATININE 2.03* 2.20* 2.34*  CALCIUM 9.7 8.9 9.0   Liver Function Tests:  Recent Labs Lab 03/02/13 1050  AST 38*  ALT 21  ALKPHOS 35*  BILITOT 0.4  PROT 8.2  ALBUMIN 4.0    Recent Labs Lab 03/02/13 1050   LIPASE 22   CBC:  Recent Labs Lab 03/02/13 1050 03/03/13 0550 03/04/13 0600  WBC 6.6 4.3 4.0  NEUTROABS 5.1  --   --   HGB 13.0 11.2* 11.4*  HCT 38.6 35.3* 34.3*  MCV 95.8 96.2 96.3  PLT 156 149* 143*   Cardiac Enzymes:  Recent Labs Lab 03/02/13 1050  TROPONINI <0.30   CBG:  Recent Labs Lab 03/03/13 1652 03/03/13 2158 03/04/13 0709 03/04/13 1125 03/04/13 1635  GLUCAP 85 124* 155* 150* 66*    Recent Results (from the past 240 hour(s))  CULTURE, BLOOD (ROUTINE X 2)     Status: None   Collection Time    03/02/13  1:39 PM      Result Value Range Status   Specimen Description BLOOD LEFT THUMB   Final   Special Requests BOTTLES DRAWN AEROBIC AND ANAEROBIC 5CC   Final   Culture  Setup Time     Final   Value: 03/02/2013 16:16     Performed at [facility]   Culture     Final   Value:        BLOOD CULTURE RECEIVED NO GROWTH TO DATE CULTURE WILL BE HELD FOR 5 DAYS BEFORE ISSUING A FINAL NEGATIVE REPORT     Performed at [facility]   Report Status PENDING   Incomplete  CULTURE, BLOOD (ROUTINE X 2)     Status: None   Collection Time    03/02/13  1:44 PM      Result Value Range Status   Specimen Description BLOOD LEFT HAND   Final   Special Requests BOTTLES DRAWN AEROBIC AND ANAEROBIC 5CC   Final   Culture  Setup Time     Final   Value: 03/02/2013 16:16     Performed at [facility]   Culture     Final   Value:        BLOOD CULTURE RECEIVED NO GROWTH TO DATE CULTURE WILL BE HELD FOR 5 DAYS BEFORE ISSUING A FINAL NEGATIVE REPORT     Performed at [facility]   Report Status PENDING   Incomplete     Scheduled Meds: . amLODipine  5 mg Oral Daily  . aspirin  81 mg Oral Daily  . atorvastatin  20 mg Oral QHS  . brinzolamide  1 drop Both Eyes BID   And  . brimonidine  1 drop Both Eyes BID  . fenofibrate  160 mg Oral Daily  . ferrous sulfate  325 mg Oral BID WC  . heparin  5,000 Units Subcutaneous Q8H  . insulin aspart  0-15  Units Subcutaneous TID WC  . insulin aspart  0-5 Units Subcutaneous QHS  . insulin NPH Human  70 Units Subcutaneous QAC breakfast  . levothyroxine  150 mcg Oral QAC breakfast  . oseltamivir  30 mg Oral Daily  . piperacillin-tazobactam (ZOSYN)  IV  3.375 g Intravenous Q8H  . sodium chloride  3 mL Intravenous Q12H  . vancomycin  750 mg Intravenous Q24H   Continuous Infusions: . sodium chloride 75 mL/hr at 03/04/13 0119   Faye Ramsay, MD  [facility] Pager [phone]  If 7PM-7AM, please contact night-coverage [email or web address] Password [email or web address] 03/04/2013, 5:29 PM   LOS: 2 days

## 2013-03-05 ENCOUNTER — Inpatient Hospital Stay (HOSPITAL_COMMUNITY): Payer: Medicare Other

## 2013-03-05 LAB — CBC
HEMATOCRIT: 33.7 % — AB (ref 36.0–46.0)
Hemoglobin: 10.8 g/dL — ABNORMAL LOW (ref 12.0–15.0)
MCH: 31.1 pg (ref 26.0–34.0)
MCHC: 32 g/dL (ref 30.0–36.0)
MCV: 97.1 fL (ref 78.0–100.0)
Platelets: 129 10*3/uL — ABNORMAL LOW (ref 150–400)
RBC: 3.47 MIL/uL — ABNORMAL LOW (ref 3.87–5.11)
RDW: 13.5 % (ref 11.5–15.5)
WBC: 3.6 10*3/uL — ABNORMAL LOW (ref 4.0–10.5)

## 2013-03-05 LAB — GLUCOSE, CAPILLARY
GLUCOSE-CAPILLARY: 131 mg/dL — AB (ref 70–99)
GLUCOSE-CAPILLARY: 181 mg/dL — AB (ref 70–99)
GLUCOSE-CAPILLARY: 395 mg/dL — AB (ref 70–99)
Glucose-Capillary: 110 mg/dL — ABNORMAL HIGH (ref 70–99)
Glucose-Capillary: 340 mg/dL — ABNORMAL HIGH (ref 70–99)

## 2013-03-05 LAB — BASIC METABOLIC PANEL
BUN: 32 mg/dL — AB (ref 6–23)
CALCIUM: 8.9 mg/dL (ref 8.4–10.5)
CO2: 19 meq/L (ref 19–32)
Chloride: 107 mEq/L (ref 96–112)
Creatinine, Ser: 2.38 mg/dL — ABNORMAL HIGH (ref 0.50–1.10)
GFR calc Af Amer: 21 mL/min — ABNORMAL LOW (ref >90)
GFR calc non Af Amer: 18 mL/min — ABNORMAL LOW (ref >90)
GLUCOSE: 121 mg/dL — AB (ref 70–99)
Potassium: 4.5 mEq/L (ref 3.7–5.3)
SODIUM: 139 meq/L (ref 137–147)

## 2013-03-05 LAB — PRO B NATRIURETIC PEPTIDE: Pro B Natriuretic peptide (BNP): 491.6 pg/mL — ABNORMAL HIGH (ref 0–450)

## 2013-03-05 MED ORDER — ALBUTEROL SULFATE (2.5 MG/3ML) 0.083% IN NEBU: 2.5000 mg | INHALATION_SOLUTION | RESPIRATORY_TRACT | Status: DC | PRN

## 2013-03-05 MED ORDER — ALBUTEROL SULFATE (2.5 MG/3ML) 0.083% IN NEBU
2.5000 mg | INHALATION_SOLUTION | Freq: Four times a day (QID) | RESPIRATORY_TRACT | Status: DC
  Administered 2013-03-05 – 2013-03-07 (×9): 2.5 mg via RESPIRATORY_TRACT
  Filled 2013-03-05 (×8): qty 3

## 2013-03-05 MED ORDER — METHYLPREDNISOLONE SODIUM SUCC 40 MG IJ SOLR
40.0000 mg | Freq: Four times a day (QID) | INTRAMUSCULAR | Status: DC
  Administered 2013-03-05 – 2013-03-06 (×4): 40 mg via INTRAVENOUS
  Filled 2013-03-05 (×8): qty 1

## 2013-03-05 NOTE — Progress Notes (Addendum)
Patient ID: Jocelyn Mendez, female   DOB: [birth year], 80 y.o.   MRN: [id number]  [facility] PROGRESS NOTE  Jocelyn Mendez [id number] DOB: May 24, 1930 DOA: 03/02/2013 PCP: Noralee Space, MD  Brief narrative:  80 year old female with a history of diabetes mellitus2, hyperlipidemia, hypertension, hypothyroidism presents with approximately three-day history of fever, generalized weakness, coughing, shortness of breath. The patient has been having fevers and chills at home without taking her temperature. She has had some coughing with yellow-green sputum. Denies any hemoptysis, chest discomfort, vomiting, diarrhea, abdominal pain, dysuria, hematuria. She has had some nausea without emesis. She has also been complaining of a headache with myalgias. She called her primary care provider whom started her on Levaquin of which she has taken 2 doses without much relief.   In the ED, the patient was noted to have temperature 102.8 and mild tachycardia with a heart rate of 102. The patient was given 1 L of normal saline. BMP was unremarkable except for serum creatinine 2.03. CBC showed borderline platelets of 156,000. WBC was 6.6. Initial troponin was negative. Lactic acid was 1.3. EKG showed sinus rhythm with nonspecific T-wave changes and first degree AV block. Influenza PCR was ordered.   Assessment/Plan:  SIRS with Influenza Like Illness  -Influenza PCR positive  -Started Tamiflu and will continue  -Blood cultures x2 sets pending but negative to date  -pt with more wheezing this AM and course breath sounds -continue vancomycin and Zosyn pending culture data  -repeat CXR, add solumedrol and BD's scheduled and as needed  Acute respiratory failure - secondary to influenza - ? COPD with vascular congestion, order CXR this AM  - will order 2 D EHCO - hold Lasix due to worsening renal function - check BNP - daily weights, I's and O's - place on Solumedrol, BD's scheduled and as needed  Acute  on chronic renal failure (CKD stage III)  -Baseline creatinine 1.6-1.7, Cr is trending up  -d/c IVF to avoid volume overload, close monitoring of renal function  -BMP in AM  Hypertension  -Continue amlodipine  Diabetes mellitus type 2  -Hemoglobin A1c 7.0 on 02/14/2013  -Started half home dose of NPH but may need to lower more as pt has had hypoglycemic events  -adjust the regimen as indicated  Lower extremity pain and edema  -Venous duplex negative for DVT   Consultants:  None Procedures/Studies:  Dg Chest 2 View 03/02/2013 Low lung volumes without radiographic evidence of acute cardiopulmonary disease. Atherosclerosis.  Antibiotics/Antivirals:  Vancomycin 01/21 -->  Zosyn 01/21 -->  Tamiflu 01/21 -->  Code Status: Full  Family Communication: Pt at bedside  Disposition Plan: Likely home when medically stable   HPI/Subjective: No events overnight.   Objective: Filed Vitals:   03/04/13 1337 03/04/13 2200 03/05/13 0600 03/05/13 1034  BP: 112/62 103/52 123/50   Pulse: 62 64 58   Temp: 97.9 F (36.6 C) 98.6 F (37 C) 98.1 F (36.7 C)   TempSrc: Oral Oral Oral   Resp: 2 18 18    Height:      Weight:      SpO2: 100% 96% 100% 98%    Intake/Output Summary (Last 24 hours) at 03/05/13 1301 Last data filed at 03/04/13 1447  Gross per 24 hour  Intake    240 ml  Output      0 ml  Net    240 ml    Exam:   General:  Pt is alert, follows commands appropriately, not in acute distress  Cardiovascular:  Regular rate and rhythm, S1/S2, no murmurs, no rubs, no gallops  Respiratory: Course breath sounds bilaterally, expiratory wheezing   Abdomen: Soft, non tender, non distended, bowel sounds present, no guarding  Extremities: No edema, pulses DP and PT palpable bilaterally  Data Reviewed: Basic Metabolic Panel:  Recent Labs Lab 03/02/13 1050 03/03/13 0550 03/04/13 0600 03/05/13 0550  NA 137 137 137 139  K 4.5 4.0 4.8 4.5  CL 101 103 104 107  CO2 20 20 18* 19   GLUCOSE 116* 62* 157* 121*  BUN 25* 28* 29* 32*  CREATININE 2.03* 2.20* 2.34* 2.38*  CALCIUM 9.7 8.9 9.0 8.9   Liver Function Tests:  Recent Labs Lab 03/02/13 1050  AST 38*  ALT 21  ALKPHOS 35*  BILITOT 0.4  PROT 8.2  ALBUMIN 4.0    Recent Labs Lab 03/02/13 1050  LIPASE 22   CBC:  Recent Labs Lab 03/02/13 1050 03/03/13 0550 03/04/13 0600 03/05/13 0550  WBC 6.6 4.3 4.0 3.6*  NEUTROABS 5.1  --   --   --   HGB 13.0 11.2* 11.4* 10.8*  HCT 38.6 35.3* 34.3* 33.7*  MCV 95.8 96.2 96.3 97.1  PLT 156 149* 143* 129*   Cardiac Enzymes:  Recent Labs Lab 03/02/13 1050  TROPONINI <0.30   CBG:  Recent Labs Lab 03/04/13 1125 03/04/13 1635 03/04/13 2121 03/05/13 0759 03/05/13 1127  GLUCAP 150* 66* 131* 110* 181*    Recent Results (from the past 240 hour(s))  CULTURE, BLOOD (ROUTINE X 2)     Status: None   Collection Time    03/02/13  1:39 PM      Result Value Range Status   Specimen Description BLOOD LEFT THUMB   Final   Special Requests BOTTLES DRAWN AEROBIC AND ANAEROBIC 5CC   Final   Culture  Setup Time     Final   Value: 03/02/2013 16:16     Performed at [facility]   Culture     Final   Value:        BLOOD CULTURE RECEIVED NO GROWTH TO DATE CULTURE WILL BE HELD FOR 5 DAYS BEFORE ISSUING A FINAL NEGATIVE REPORT     Performed at [facility]   Report Status PENDING   Incomplete  CULTURE, BLOOD (ROUTINE X 2)     Status: None   Collection Time    03/02/13  1:44 PM      Result Value Range Status   Specimen Description BLOOD LEFT HAND   Final   Special Requests BOTTLES DRAWN AEROBIC AND ANAEROBIC 5CC   Final   Culture  Setup Time     Final   Value: 03/02/2013 16:16     Performed at [facility]   Culture     Final   Value:        BLOOD CULTURE RECEIVED NO GROWTH TO DATE CULTURE WILL BE HELD FOR 5 DAYS BEFORE ISSUING A FINAL NEGATIVE REPORT     Performed at [facility]   Report Status PENDING   Incomplete      Scheduled Meds: . albuterol  2.5 mg Nebulization QID  . amLODipine  5 mg Oral Daily  . aspirin  81 mg Oral Daily  . atorvastatin  20 mg Oral QHS  . brinzolamide  1 drop Both Eyes BID   And  . brimonidine  1 drop Both Eyes BID  . fenofibrate  160 mg Oral Daily  . ferrous sulfate  325 mg Oral BID WC  .  furosemide  20 mg Oral Daily  . heparin  5,000 Units Subcutaneous Q8H  . insulin aspart  0-15 Units Subcutaneous TID WC  . insulin aspart  0-5 Units Subcutaneous QHS  . insulin NPH Human  50 Units Subcutaneous QAC breakfast  . levothyroxine  150 mcg Oral QAC breakfast  . methylPREDNISolone (SOLU-MEDROL) injection  40 mg Intravenous Q6H  . oseltamivir  30 mg Oral Daily  . piperacillin-tazobactam (ZOSYN)  IV  3.375 g Intravenous Q8H  . sodium chloride  3 mL Intravenous Q12H  . vancomycin  750 mg Intravenous Q24H   Continuous Infusions:    Faye Ramsay, MD  [facility] Pager [phone]  If 7PM-7AM, please contact night-coverage [email or web address] Password [email or web address] 03/05/2013, 1:01 PM   LOS: 3 days

## 2013-03-05 NOTE — Progress Notes (Signed)
Pt has slept most of the night.  Up two times to bathroom.  Voided well.  Has productive cough.  No c/o pain during night. Daughter stayed with her to assist.  Call bell in reach.

## 2013-03-06 DIAGNOSIS — I319 Disease of pericardium, unspecified: Secondary | ICD-10-CM

## 2013-03-06 LAB — GLUCOSE, CAPILLARY
Glucose-Capillary: 465 mg/dL — ABNORMAL HIGH (ref 70–99)
Glucose-Capillary: 533 mg/dL — ABNORMAL HIGH (ref 70–99)
Glucose-Capillary: 503 mg/dL — ABNORMAL HIGH (ref 70–99)

## 2013-03-06 LAB — BASIC METABOLIC PANEL
BUN: 35 mg/dL — ABNORMAL HIGH (ref 6–23)
CALCIUM: 9.1 mg/dL (ref 8.4–10.5)
CO2: 17 mEq/L — ABNORMAL LOW (ref 19–32)
CREATININE: 2.31 mg/dL — AB (ref 0.50–1.10)
Chloride: 100 mEq/L (ref 96–112)
GFR, EST AFRICAN AMERICAN: 22 mL/min — AB (ref >90)
GFR, EST NON AFRICAN AMERICAN: 19 mL/min — AB (ref >90)
Glucose, Bld: 461 mg/dL — ABNORMAL HIGH (ref 70–99)
Potassium: 4.9 mEq/L (ref 3.7–5.3)
SODIUM: 135 meq/L — AB (ref 137–147)

## 2013-03-06 LAB — CBC
HCT: 35.4 % — ABNORMAL LOW (ref 36.0–46.0)
Hemoglobin: 11.7 g/dL — ABNORMAL LOW (ref 12.0–15.0)
MCH: 31.6 pg (ref 26.0–34.0)
MCHC: 33.1 g/dL (ref 30.0–36.0)
MCV: 95.7 fL (ref 78.0–100.0)
PLATELETS: 144 10*3/uL — AB (ref 150–400)
RBC: 3.7 MIL/uL — ABNORMAL LOW (ref 3.87–5.11)
RDW: 13.2 % (ref 11.5–15.5)
WBC: 5 10*3/uL (ref 4.0–10.5)

## 2013-03-06 LAB — GLUCOSE, RANDOM
GLUCOSE: 558 mg/dL — AB (ref 70–99)
Glucose, Bld: 520 mg/dL — ABNORMAL HIGH (ref 70–99)

## 2013-03-06 MED ORDER — METHYLPREDNISOLONE SODIUM SUCC 40 MG IJ SOLR
40.0000 mg | Freq: Two times a day (BID) | INTRAMUSCULAR | Status: DC
  Administered 2013-03-06 – 2013-03-07 (×2): 40 mg via INTRAVENOUS
  Filled 2013-03-06 (×3): qty 1

## 2013-03-06 MED ORDER — INSULIN ASPART 100 UNIT/ML ~~LOC~~ SOLN: 0.0000 [IU] | Freq: Every day | SUBCUTANEOUS | Status: DC

## 2013-03-06 MED ORDER — LEVOFLOXACIN 500 MG PO TABS
500.0000 mg | ORAL_TABLET | ORAL | Status: DC
  Administered 2013-03-06 – 2013-03-08 (×2): 500 mg via ORAL
  Filled 2013-03-06 (×2): qty 1

## 2013-03-06 MED ORDER — INSULIN ASPART 100 UNIT/ML ~~LOC~~ SOLN
8.0000 [IU] | Freq: Once | SUBCUTANEOUS | Status: AC
  Administered 2013-03-06: 8 [IU] via SUBCUTANEOUS

## 2013-03-06 MED ORDER — INSULIN NPH (HUMAN) (ISOPHANE) 100 UNIT/ML ~~LOC~~ SUSP
60.0000 [IU] | Freq: Every day | SUBCUTANEOUS | Status: DC
  Administered 2013-03-06 (×2): 60 [IU] via SUBCUTANEOUS

## 2013-03-06 MED ORDER — INSULIN ASPART 100 UNIT/ML ~~LOC~~ SOLN
0.0000 [IU] | Freq: Three times a day (TID) | SUBCUTANEOUS | Status: DC
  Administered 2013-03-06: 19 [IU] via SUBCUTANEOUS
  Administered 2013-03-06: 15 [IU] via SUBCUTANEOUS

## 2013-03-06 MED ORDER — INSULIN ASPART 100 UNIT/ML ~~LOC~~ SOLN
4.0000 [IU] | Freq: Three times a day (TID) | SUBCUTANEOUS | Status: DC
  Administered 2013-03-06: 4 [IU] via SUBCUTANEOUS
  Administered 2013-03-06: 18:00:00 via SUBCUTANEOUS

## 2013-03-06 NOTE — Progress Notes (Signed)
Patient ID: Jocelyn Mendez, female   DOB: 1930-05-04, 80 y.o.   MRN: [id number] [facility] PROGRESS NOTE  Jocelyn Mendez [id number] DOB: [birth year] DOA: 03/02/2013 PCP: Noralee Space, MD  Brief narrative:  80 year old female with a history of diabetes mellitus2, hyperlipidemia, hypertension, hypothyroidism presents with approximately three-day history of fever, generalized weakness, coughing, shortness of breath. The patient has been having fevers and chills at home without taking her temperature. She has had some coughing with yellow-green sputum. Denies any hemoptysis, chest discomfort, vomiting, diarrhea, abdominal pain, dysuria, hematuria. She has had some nausea without emesis. She has also been complaining of a headache with myalgias. She called her primary care provider whom started her on Levaquin of which she has taken 2 doses without much relief.   In the ED, the patient was noted to have temperature 102.8 and mild tachycardia with a heart rate of 102. The patient was given 1 L of normal saline. BMP was unremarkable except for serum creatinine 2.03. CBC showed borderline platelets of 156,000. WBC was 6.6. Initial troponin was negative. Lactic acid was 1.3. EKG showed sinus rhythm with nonspecific T-wave changes and first degree AV block. Influenza PCR was ordered.   Assessment/Plan:  SIRS with Influenza Like Illness  -Influenza PCR positive  -Started Tamiflu and will continue  -Blood cultures x2 sets pending but negative to date  -wheezing improved this AM  -continue vancomycin and Zosyn day #5, transition to oral Levaquin today  -repeat CXR indicated improved aeration, continue solumedrol and BD's, taper down solumedrol  Acute respiratory failure  - secondary to influenza  - ? COPD with vascular congestion - 2 D ECHO with grade I diastolic dysfunction  - continue solumedrol and BD's, close monitoring of fluid balance  Acute on chronic renal failure (CKD stage III)   -Baseline creatinine 1.6-1.7, Cr is trending down slowly  -d/c'd IVF to avoid volume overload, close monitoring of renal function  -BMP in AM  Hypertension  -Continue amlodipine  Diabetes mellitus type 2  -Hemoglobin A1c 7.0 on 02/14/2013  -increase dose of NPH insulin as pt is now on solumedrol  -change SSI to moderate coverage and add meal/night time coverage as well  Lower extremity pain and edema  -Venous duplex negative for DVT   Consultants:  None Procedures/Studies:  Dg Chest 2 View 03/02/2013 Low lung volumes without radiographic evidence of acute cardiopulmonary disease. Atherosclerosis.  Antibiotics/Antivirals:  Vancomycin 01/21 --> 01/25 Zosyn 01/21 --> 01/25 Tamiflu 01/21 --> Levaquin 01/25 -->  Code Status: Full  Family Communication: Pt at bedside  Disposition Plan: Likely home in 1-2 days   HPI/Subjective: No events overnight.   Objective: Filed Vitals:   03/05/13 2134 03/06/13 0644 03/06/13 0929 03/06/13 1052  BP: 145/63 127/83  127/83  Pulse: 89 98    Temp: 97.9 F (36.6 C) 97.6 F (36.4 C)    TempSrc: Oral Oral    Resp: 18 18    Height:      Weight:  80.06 kg (176 lb 8 oz)    SpO2: 98% 96% 97%     Intake/Output Summary (Last 24 hours) at 03/06/13 1248 Last data filed at 03/06/13 1053  Gross per 24 hour  Intake    803 ml  Output      0 ml  Net    803 ml    Exam:   General:  Pt is alert, follows commands appropriately, not in acute distress  Cardiovascular: Regular rate and rhythm, S1/S2, no murmurs, no rubs,  no gallops  Respiratory: Scattered rhonchi, mild expiratory wheezing   Abdomen: Soft, non tender, non distended, bowel sounds present, no guarding  Extremities: No edema, pulses DP and PT palpable bilaterally  Neuro: Grossly nonfocal  Data Reviewed: Basic Metabolic Panel:  Recent Labs Lab 03/02/13 1050 03/03/13 0550 03/04/13 0600 03/05/13 0550 03/06/13 0552 03/06/13 0924  NA 137 137 137 139 135*  --   K 4.5 4.0 4.8  4.5 4.9  --   CL 101 103 104 107 100  --   CO2 20 20 18* 19 17*  --   GLUCOSE 116* 62* 157* 121* 461* 520*  BUN 25* 28* 29* 32* 35*  --   CREATININE 2.03* 2.20* 2.34* 2.38* 2.31*  --   CALCIUM 9.7 8.9 9.0 8.9 9.1  --    Liver Function Tests:  Recent Labs Lab 03/02/13 1050  AST 38*  ALT 21  ALKPHOS 35*  BILITOT 0.4  PROT 8.2  ALBUMIN 4.0    Recent Labs Lab 03/02/13 1050  LIPASE 22   CBC:  Recent Labs Lab 03/02/13 1050 03/03/13 0550 03/04/13 0600 03/05/13 0550 03/06/13 0552  WBC 6.6 4.3 4.0 3.6* 5.0  NEUTROABS 5.1  --   --   --   --   HGB 13.0 11.2* 11.4* 10.8* 11.7*  HCT 38.6 35.3* 34.3* 33.7* 35.4*  MCV 95.8 96.2 96.3 97.1 95.7  PLT 156 149* 143* 129* 144*   Cardiac Enzymes:  Recent Labs Lab 03/02/13 1050  TROPONINI <0.30   CBG:  Recent Labs Lab 03/05/13 1127 03/05/13 1617 03/05/13 1942 03/06/13 0728 03/06/13 1140  GLUCAP 181* 340* 395* 465* 533*    Recent Results (from the past 240 hour(s))  CULTURE, BLOOD (ROUTINE X 2)     Status: None   Collection Time    03/02/13  1:39 PM      Result Value Range Status   Specimen Description BLOOD LEFT THUMB   Final   Special Requests BOTTLES DRAWN AEROBIC AND ANAEROBIC 5CC   Final   Culture  Setup Time     Final   Value: 03/02/2013 16:16     Performed at [facility]   Culture     Final   Value:        BLOOD CULTURE RECEIVED NO GROWTH TO DATE CULTURE WILL BE HELD FOR 5 DAYS BEFORE ISSUING A FINAL NEGATIVE REPORT     Performed at [facility]   Report Status PENDING   Incomplete  CULTURE, BLOOD (ROUTINE X 2)     Status: None   Collection Time    03/02/13  1:44 PM      Result Value Range Status   Specimen Description BLOOD LEFT HAND   Final   Special Requests BOTTLES DRAWN AEROBIC AND ANAEROBIC 5CC   Final   Culture  Setup Time     Final   Value: 03/02/2013 16:16     Performed at [facility]   Culture     Final   Value:        BLOOD CULTURE RECEIVED NO GROWTH TO DATE  CULTURE WILL BE HELD FOR 5 DAYS BEFORE ISSUING A FINAL NEGATIVE REPORT     Performed at [facility]   Report Status PENDING   Incomplete     Scheduled Meds: . albuterol  2.5 mg Nebulization QID  . amLODipine  5 mg Oral Daily  . aspirin  81 mg Oral Daily  . atorvastatin  20 mg Oral QHS  .  brinzolamide  1 drop Both Eyes BID   And  . brimonidine  1 drop Both Eyes BID  . fenofibrate  160 mg Oral Daily  . ferrous sulfate  325 mg Oral BID WC  . heparin  5,000 Units Subcutaneous Q8H  . insulin aspart  0-15 Units Subcutaneous TID WC  . insulin aspart  0-5 Units Subcutaneous QHS  . insulin aspart  4 Units Subcutaneous TID WC  . [START ON 03/07/2013] insulin NPH Human  60 Units Subcutaneous QAC breakfast  . levothyroxine  150 mcg Oral QAC breakfast  . methylPREDNISolone (SOLU-MEDROL) injection  40 mg Intravenous Q6H  . piperacillin-tazobactam (ZOSYN)  IV  3.375 g Intravenous Q8H  . sodium chloride  3 mL Intravenous Q12H  . vancomycin  750 mg Intravenous Q24H   Continuous Infusions:    Faye Ramsay, MD  [facility] Pager [phone]  If 7PM-7AM, please contact night-coverage [email or web address] Password [email or web address] 03/06/2013, 12:48 PM   LOS: 4 days

## 2013-03-06 NOTE — Progress Notes (Signed)
Pt ambulating in the hall with daughters, down to waiting room, tolerated well. CBG's running high today, lab verified each time. Dr Doyle Askew notified each time.

## 2013-03-06 NOTE — Progress Notes (Signed)
  Echocardiogram 2D Echocardiogram has been performed.  Donata Clay 03/06/2013, 10:45 AM

## 2013-03-07 ENCOUNTER — Inpatient Hospital Stay (HOSPITAL_COMMUNITY): Payer: Medicare Other

## 2013-03-07 DIAGNOSIS — R0602 Shortness of breath: Secondary | ICD-10-CM

## 2013-03-07 DIAGNOSIS — J101 Influenza due to other identified influenza virus with other respiratory manifestations: Secondary | ICD-10-CM

## 2013-03-07 DIAGNOSIS — J9601 Acute respiratory failure with hypoxia: Secondary | ICD-10-CM

## 2013-03-07 LAB — CBC
HCT: 32.4 % — ABNORMAL LOW (ref 36.0–46.0)
Hemoglobin: 10.8 g/dL — ABNORMAL LOW (ref 12.0–15.0)
MCH: 31.6 pg (ref 26.0–34.0)
MCHC: 33.3 g/dL (ref 30.0–36.0)
MCV: 94.7 fL (ref 78.0–100.0)
PLATELETS: 167 10*3/uL (ref 150–400)
RBC: 3.42 MIL/uL — ABNORMAL LOW (ref 3.87–5.11)
RDW: 13.1 % (ref 11.5–15.5)
WBC: 8.4 10*3/uL (ref 4.0–10.5)

## 2013-03-07 LAB — GLUCOSE, CAPILLARY
Glucose-Capillary: 465 mg/dL — ABNORMAL HIGH (ref 70–99)
Glucose-Capillary: 487 mg/dL — ABNORMAL HIGH (ref 70–99)
Glucose-Capillary: 496 mg/dL — ABNORMAL HIGH (ref 70–99)
Glucose-Capillary: 427 mg/dL — ABNORMAL HIGH (ref 70–99)

## 2013-03-07 LAB — GLUCOSE, RANDOM
Glucose, Bld: 566 mg/dL (ref 70–99)
Glucose, Bld: 542 mg/dL — ABNORMAL HIGH (ref 70–99)
Glucose, Bld: 451 mg/dL — ABNORMAL HIGH (ref 70–99)

## 2013-03-07 LAB — BASIC METABOLIC PANEL
BUN: 46 mg/dL — ABNORMAL HIGH (ref 6–23)
CALCIUM: 8.9 mg/dL (ref 8.4–10.5)
CHLORIDE: 99 meq/L (ref 96–112)
CO2: 19 mEq/L (ref 19–32)
CREATININE: 2.19 mg/dL — AB (ref 0.50–1.10)
GFR calc non Af Amer: 20 mL/min — ABNORMAL LOW (ref >90)
GFR, EST AFRICAN AMERICAN: 23 mL/min — AB (ref >90)
Glucose, Bld: 536 mg/dL — ABNORMAL HIGH (ref 70–99)
Potassium: 5 mEq/L (ref 3.7–5.3)
Sodium: 132 mEq/L — ABNORMAL LOW (ref 137–147)

## 2013-03-07 MED ORDER — OSELTAMIVIR PHOSPHATE 30 MG PO CAPS
30.0000 mg | ORAL_CAPSULE | Freq: Every day | ORAL | Status: DC
  Filled 2013-03-07: qty 1

## 2013-03-07 MED ORDER — INSULIN ASPART 100 UNIT/ML ~~LOC~~ SOLN
0.0000 [IU] | Freq: Every day | SUBCUTANEOUS | Status: DC
  Administered 2013-03-07 – 2013-03-08 (×2): 3 [IU] via SUBCUTANEOUS

## 2013-03-07 MED ORDER — PREDNISONE 20 MG PO TABS
40.0000 mg | ORAL_TABLET | Freq: Every day | ORAL | Status: DC
  Administered 2013-03-08: 40 mg via ORAL
  Filled 2013-03-07 (×2): qty 2

## 2013-03-07 MED ORDER — INSULIN ASPART 100 UNIT/ML ~~LOC~~ SOLN
6.0000 [IU] | Freq: Three times a day (TID) | SUBCUTANEOUS | Status: DC
  Administered 2013-03-07: 6 [IU] via SUBCUTANEOUS
  Administered 2013-03-07: 19:00:00 via SUBCUTANEOUS
  Administered 2013-03-08 – 2013-03-09 (×4): 6 [IU] via SUBCUTANEOUS

## 2013-03-07 MED ORDER — INSULIN NPH (HUMAN) (ISOPHANE) 100 UNIT/ML ~~LOC~~ SUSP
80.0000 [IU] | Freq: Every day | SUBCUTANEOUS | Status: DC
  Administered 2013-03-08: 80 [IU] via SUBCUTANEOUS

## 2013-03-07 MED ORDER — BUDESONIDE 0.25 MG/2ML IN SUSP
0.2500 mg | Freq: Two times a day (BID) | RESPIRATORY_TRACT | Status: DC
  Administered 2013-03-07 – 2013-03-09 (×4): 0.25 mg via RESPIRATORY_TRACT
  Filled 2013-03-07 (×8): qty 2

## 2013-03-07 MED ORDER — IPRATROPIUM-ALBUTEROL 0.5-2.5 (3) MG/3ML IN SOLN
3.0000 mL | RESPIRATORY_TRACT | Status: DC
  Administered 2013-03-07 – 2013-03-09 (×12): 3 mL via RESPIRATORY_TRACT
  Filled 2013-03-07 (×12): qty 3

## 2013-03-07 MED ORDER — OSELTAMIVIR PHOSPHATE 75 MG PO CAPS: 75.0000 mg | ORAL_CAPSULE | Freq: Two times a day (BID) | ORAL | Status: DC

## 2013-03-07 MED ORDER — INSULIN ASPART 100 UNIT/ML ~~LOC~~ SOLN
0.0000 [IU] | Freq: Three times a day (TID) | SUBCUTANEOUS | Status: DC
  Administered 2013-03-07: 20 [IU] via SUBCUTANEOUS
  Administered 2013-03-07: 19 [IU] via SUBCUTANEOUS
  Administered 2013-03-08 (×2): 4 [IU] via SUBCUTANEOUS
  Administered 2013-03-08: 11 [IU] via SUBCUTANEOUS
  Administered 2013-03-09: 7 [IU] via SUBCUTANEOUS
  Administered 2013-03-09: 11 [IU] via SUBCUTANEOUS

## 2013-03-07 NOTE — Progress Notes (Signed)
PT Cancellation Note  Patient Details Name: Jocelyn Mendez MRN: [id number] DOB: 06-25-1930   Cancelled Treatment:    Reason Eval/Treat Not Completed: Patient at procedure or test/unavailable--will check back another day. thanks.    Weston Anna, MPT Pager: [phone]

## 2013-03-07 NOTE — Progress Notes (Signed)
Bilateral lower extremity venous duplex:  No evidence of DVT, superficial thrombosis, or Baker's Cyst.   

## 2013-03-07 NOTE — Consult Note (Addendum)
Name: Jocelyn Mendez MRN: [id number] DOB: 10/01/1930    ADMISSION DATE:  03/02/2013 CONSULTATION DATE:  1/26 LOs 5 days  REFERRING MD :  [facility] PRIMARY SERVICE: [facility]  CHIEF COMPLAINT:  SOB  BRIEF PATIENT DESCRIPTION:  80 yo remote smoker with known DM, HTN, second hand smoke exposure who reports 3 days of FCS, yellow sputum and increasing DOE prior to her admission to [facility]. She is Flu A ++ and currently on O2 at 2 l/m.  SIGNIFICANT EVENTS / STUDIES:    LINES / TUBES:   CULTURES: 1/21 flu A>>++++  ANTIBIOTICS: 1/22 levaquin>>  HISTORY OF PRESENT ILLNESS:  80 yo remote smoker with known DM, HTN, second hand smoke exposure who reports 3 days of FCS, yellow sputum and increasing DOE prior to her admission to [facility]. She is Flu A ++ and currently on O2 at 2 l/m. Notable to have sbp >120 and in NAD at rest on exam. PCCM asked to evaluate due to failure to improve despite 5d course of tamiflu. TRH Dr Magick-Myers having difficulty weaning off steroids  PAST MEDICAL HISTORY :  Past Medical History  Diagnosis Date   Acute bronchitis    Unspecified essential hypertension    Chest pain, unspecified    Type II or unspecified type diabetes mellitus without mention of complication, not stated as uncontrolled    Other and unspecified hyperlipidemia    Unspecified hypothyroidism    Esophageal reflux    Diverticulosis of colon (without mention of hemorrhage)    Unspecified disorder resulting from impaired renal function    Generalized osteoarthrosis, unspecified site    Lumbago    Osteoporosis    Headache(784.0)    Anxiety state, unspecified    Anemia of other chronic disease    Other B-complex deficiencies    Neuropathy in diabetes     bilat LE's   Past Surgical History  Procedure Laterality Date   Appendectomy     Prior to Admission medications   Medication Sig Start Date End Date Taking? Authorizing Provider  amLODipine (NORVASC) 5 MG tablet Take 5 mg  by mouth daily.   Yes Historical Provider, MD  aspirin 81 MG tablet Take 81 mg by mouth daily.     Yes Historical Provider, MD  atorvastatin (LIPITOR) 20 MG tablet Take 20 mg by mouth at bedtime.   Yes Historical Provider, MD  Brinzolamide-Brimonidine [facility]) 1-0.2 % SUSP Place 1 drop into both eyes 2 (two) times daily.   Yes Historical Provider, MD  Cholecalciferol (VITAMIN D3) 2000 UNITS TABS Take 1 tablet by mouth 2 (two) times daily.   Yes Historical Provider, MD  cyanocobalamin (,VITAMIN B-12,) 1000 MCG/ML injection Inject 1,000 mcg into the muscle every 30 (thirty) days.   Yes Historical Provider, MD  fenofibrate 160 MG tablet Take 160 mg by mouth daily.   Yes Historical Provider, MD  ferrous sulfate 325 (65 FE) MG EC tablet Take 325 mg by mouth 2 (two) times daily.    Yes Historical Provider, MD  furosemide (LASIX) 20 MG tablet Take 1 tablet (20 mg total) by mouth daily. 08/05/11  Yes Noralee Space, MD  glucose blood test strip 1 each by Other route 4 (four) times daily. Bayer contour test strips   Yes Historical Provider, MD  Insulin Isophane Human (HUMULIN N) 100 UNIT/ML Kiwkpen Inject 145 Units into the skin every morning. 02/14/13  Yes Renato Shin, MD  Insulin Pen Needle 31G X 8 MM MISC 1 each by Does not  apply route. Use with insulin pen device, humulin   Yes Historical Provider, MD  Lancets 30G MISC 1 each by Does not apply route. Use to check blood sugars four times per day   Yes Historical Provider, MD  levofloxacin (LEVAQUIN) 500 MG tablet Take 1 tablet (500 mg total) by mouth daily. 02/28/13  Yes Noralee Space, MD  levothyroxine (SYNTHROID, LEVOTHROID) 150 MCG tablet Take 150 mcg by mouth daily before breakfast.   Yes Historical Provider, MD  NEEDLE, DISP, 25 G (BD DISP NEEDLES) 25G X 5/8" MISC Use as directed to administer  the b12 injection monthly 11/19/11  Yes Noralee Space, MD  Insulin Pen Needle 31G X 8 MM MISC Use as directed with the humulin pen insulin 09/25/11   Noralee Space, MD  Lancets [facility] ULTRASOFT) lancets Use as instructed 02/15/13   Noralee Space, MD   Allergies  Allergen Reactions   Codeine     REACTION: change in mental status   Diphenhydramine Hcl     REACTION: itching   Quinine     REACTION: abnormal sensations in face   Sulfonamide Derivatives     REACTION: rash    FAMILY HISTORY:  Family History  Problem Relation Age of Onset   Diabetes Mother    SOCIAL HISTORY:  reports that she quit smoking about 40 years ago. Her smoking use included Cigarettes. She has a 2.5 pack-year smoking history. She quit smokeless tobacco use about 2 years ago. Her smokeless tobacco use included Snuff. She reports that she does not drink alcohol or use illicit drugs.  REVIEW OF SYSTEMS:   10 point review of system taken, please see HPI for positives and negatives.   SUBJECTIVE:   VITAL SIGNS: Temp:  [97.1 F (36.2 C)-97.7 F (36.5 C)] 97.7 F (36.5 C) (01/26 0617) Pulse Rate:  [65-100] 65 (01/26 0617) Resp:  [18] 18 (01/26 0617) BP: (118-161)/(65-73) 129/66 mmHg (01/26 1047) SpO2:  [96 %-98 %] 98 % (01/26 0900) Weight:  [178 lb 6.4 oz (80.922 kg)] 178 lb 6.4 oz (80.922 kg) (01/26 0614) HEMODYNAMICS:   VENTILATOR SETTINGS:   INTAKE / OUTPUT: Intake/Output     01/25 0701 - 01/26 0700 01/26 0701 - 01/27 0700   P.O. 1280 240   I.V. (mL/kg) 3 (0) 3 (0)   Total Intake(mL/kg) 1283 (15.9) 243 (3)   Urine (mL/kg/hr)  260 (0.6)   Total Output   260   Net +1283 -17        Urine Occurrence 8 x 3 x   Stool Occurrence 3 x 1 x     PHYSICAL EXAMINATION: General:  Disheveled white female in NAD Neuro:  Appears intact. MAEx4. Speech clear HEENT: No LAN/JVD Cardiovascular:  HSR RRR Lungs:  Decreased air movement, no wheezes appreciated Abdomen: Soft +bs Musculoskeletal:  Intact Skin:  Warm , no edema  LABS:  CBC  Recent Labs Lab 03/05/13 0550 03/06/13 0552 03/07/13 0545  WBC 3.6* 5.0 8.4  HGB 10.8* 11.7* 10.8*  HCT 33.7*  35.4* 32.4*  PLT 129* 144* 167   Coag's No results found for this basename: APTT, INR,  in the last 168 hours BMET  Recent Labs Lab 03/05/13 0550 03/06/13 0552  03/06/13 1718 03/07/13 0545 03/07/13 0808  NA 139 135*  --   --  132*  --   K 4.5 4.9  --   --  5.0  --   CL 107 100  --   --  99  --  CO2 19 17*  --   --  19  --   BUN 32* 35*  --   --  46*  --   CREATININE 2.38* 2.31*  --   --  2.19*  --   GLUCOSE 121* 461*  < > 558* 536* 566*  < > = values in this interval not displayed. Electrolytes  Recent Labs Lab 03/05/13 0550 03/06/13 0552 03/07/13 0545  CALCIUM 8.9 9.1 8.9   Sepsis Markers  Recent Labs Lab 03/02/13 1050  LATICACIDVEN 1.3   ABG No results found for this basename: PHART, PCO2ART, PO2ART,  in the last 168 hours Liver Enzymes  Recent Labs Lab 03/02/13 1050  AST 38*  ALT 21  ALKPHOS 35*  BILITOT 0.4  ALBUMIN 4.0   Cardiac Enzymes  Recent Labs Lab 03/02/13 1050 03/05/13 0550  TROPONINI <0.30  --   PROBNP  --  491.6*   Glucose  Recent Labs Lab 03/06/13 0728 03/06/13 1140 03/06/13 1637 03/06/13 2150 03/07/13 0734 03/07/13 1129  GLUCAP 465* 533* 503* 465* 487* 496*    Imaging No results found.   CXR: 1/24 No active disease. Improved aeration since prior exam.   ASSESSMENT   Acute respiratory failure with hypoxia   ASTHMATIC BRONCHITIS, ACUTE    SIRS (systemic inflammatory response syndrome)    HYPOTHYROIDISM    HYPERLIPIDEMIA    ANEMIA OF CHRONIC DISEASE    ANXIETY    HYPERTENSION    RENAL INSUFFICIENCY    Influenza-like illness    Fever    Diabetes mellitus type 2, insulin dependent    Acute on chronic renal failure  Stt note: Suspect failure to improve due to viral co-infection or DVt/PE or viral flu virulence v poor host   PLAN: O2 as needed BD scheduled; add atrovent. Overal increase frequency Levaquin for bronchitis Antivirals s/p 5 days  - so wont restart Change IV steroids to pol if  wheezing then reconsider IV sterods Repeat cxr ro pna (ordered)    Richardson Landry Minor ACNP Maryanna Shape PCCM Pager [phone] till 3 pm If no answer page [phone] 03/07/2013, 12:33 PM   STAFF NOTE: I, Dr Ann Lions have personally reviewed patient's available data, including medical history, events of note, physical examination and test results as part of my evaluation. I have discussed with resident/NP and other care providers such as pharmacist, RN and RRT.  In addition,  I personally evaluated patient and elicited key findings of Prior smoker. AT risk candidate for copd but no diagnosed hx. Currently with Acute conformed Flu A with 3 day symptoms bu tin hospital for 5 days and s/p 5 day tamifly. Having significant symoptoms c/w current epidemic.  Will not restart tamiflu. Given igh sugars will dc IV steroids and start po steroids (restart if wheeze). Step up BD with duoneb q4h. Check for resp  Virus coinfection with Resp Virus PCR multiplex panel and check urine leg and strep.  Check duplex LE and then depending in cours and results, consider CTA. She and her son updaed at bedside.  Rest per NP/medical resident whose note is outlined above and that I agree with  Dr. Brand Males, M.D., [facility].C.P Pulmonary and Critical Care Medicine Staff Physician [place] [facility] Pager: [phone], If no answer or between  15:00h - 7:00h: call [phone]  03/07/2013 1:11 PM

## 2013-03-07 NOTE — Progress Notes (Addendum)
Inpatient Diabetes Program Recommendations  AACE/ADA: New Consensus Statement on Inpatient Glycemic Control (2013)  Target Ranges:  Prepandial:   less than 140 mg/dL      Peak postprandial:   less than 180 mg/dL (1-2 hours)      Critically ill patients:  140 - 180 mg/dL   Reason for Visit: Uncontrolled blood sugars  Outpatient Diabetes medications: NPH 145 units daily Current orders for Inpatient glycemic control: NPH 80 units QAM, Novolog 6 units tidwc and resistant tidwc and hs  Results for ASHONTA, RUFFALO (MRN [id number]) as of 03/07/2013 10:33  Ref. Range 03/06/2013 05:52 03/06/2013 09:24 03/06/2013 17:18 03/07/2013 05:45 03/07/2013 08:08  Glucose Latest Range: 70-99 mg/dL 461 (H) 520 (H) 558 (HH) 536 (H) 566 (HH)  Results for HOLLICE, MAFFUCCI (MRN [id number]) as of 03/07/2013 10:33  Ref. Range 03/07/2013 05:45  Sodium Latest Range: 137-147 mEq/L 132 (L)  Potassium Latest Range: 3.7-5.3 mEq/L 5.0  Chloride Latest Range: 96-112 mEq/L 99  CO2 Latest Range: 19-32 mEq/L 19  BUN Latest Range: 6-23 mg/dL 46 (H)  Creatinine Latest Range: 0.50-1.10 mg/dL 2.19 (H)  Calcium Latest Range: 8.4-10.5 mg/dL 8.9  GFR calc non Af Amer Latest Range: >90 mL/min 20 (L)  GFR calc Af Amer Latest Range: >90 mL/min 23 (L)  Glucose Latest Range: 70-99 mg/dL 536 (H)  On Solumedrol 40 Q12 hours.  Eating well.  Inpatient Diabetes Program Recommendations Insulin - IV drip/GlucoStabilizer: Please consider IV insulin/GlucoStabilizer since blood sugars are >500mg /dL X 24 hours Diet: Continue with CHO mod med and cover CHO with bolus if on GlucoStabilizer  Note: Will continue to follow. Thank you. Lorenda Peck, RD, LDN, CDE Inpatient Diabetes Coordinator [phone]  Addendum:  If GlucoStabilizer is not started, would recommend 2 doses of NPH - 80 in am and 40 QHS. Also will need increase in meal coverage insulin to Novolog 10 units tidwc.

## 2013-03-07 NOTE — Progress Notes (Signed)
Patient ID: Jocelyn Mendez, female   DOB: 1930-06-05, 80 y.o.   MRN: [id number] [facility] PROGRESS NOTE  RANYIA BOTH [id number] DOB: October 28, 1930 DOA: 03/02/2013 PCP: Noralee Space, MD  Brief narrative:  80 year old female with a history of diabetes mellitus2, hyperlipidemia, hypertension, hypothyroidism presents with approximately three-day history of fever, generalized weakness, coughing, shortness of breath. The patient has been having fevers and chills at home without taking her temperature. She has had some coughing with yellow-green sputum. Denies any hemoptysis, chest discomfort, vomiting, diarrhea, abdominal pain, dysuria, hematuria. She has had some nausea without emesis. She has also been complaining of a headache with myalgias. She called her primary care provider whom started her on Levaquin of which she has taken 2 doses without much relief.   In the ED, the patient was noted to have temperature 102.8 and mild tachycardia with a heart rate of 102. The patient was given 1 L of normal saline. BMP was unremarkable except for serum creatinine 2.03. CBC showed borderline platelets of 156,000. WBC was 6.6. Initial troponin was negative. Lactic acid was 1.3. EKG showed sinus rhythm with nonspecific T-wave changes and first degree AV block. Influenza PCR was ordered.   Assessment/Plan:  SIRS with Influenza Like Illness  - Influenza PCR positive  - Started Tamiflu and pt has completed therapy but with failure to improve clinically, still wheezing on exam despite IV Steroids  - Blood cultures x2 sets pending but negative to date  - completed Vancomycin for 5 days and transitioned to oral Levaquin 01/25, continue today  - repeat CXR indicated improved aeration - we are having difficulty weaning off steroids - PCCM consult for further assistance and recommendations  Acute respiratory failure  - secondary to influenza  - ? COPD with vascular congestion  - 2 D ECHO with grade I  diastolic dysfunction  - will ask PCCM for further input as we area having difficulty weaning off steroids Acute on chronic renal failure (CKD stage III)  - Baseline creatinine 1.6-1.7, Cr is trending down slowly  - d/c'd IVF to avoid volume overload, close monitoring of renal function  - BMP in AM  Hypertension  - Continue amlodipine  Diabetes mellitus type 2  - Hemoglobin A1c 7.0 on 02/14/2013  - increase dose of NPH insulin as pt is now on steroids - will transition to oral Prednisone as CBG > 500 on solumedrol  - change SSI to resistant coverage and increase meal/night time coverage as well  Lower extremity pain and edema  - Venous duplex negative for DVT   Consultants:  None Procedures/Studies:  Dg Chest 2 View 03/02/2013 Low lung volumes without radiographic evidence of acute cardiopulmonary disease. Atherosclerosis.  Antibiotics/Antivirals:  Vancomycin 01/21 --> 01/25  Zosyn 01/21 --> 01/25  Tamiflu 01/21 -->  Levaquin 01/25 -->  Code Status: Full  Family Communication: Pt and son at bedside  Disposition Plan: Remains inpatient   HPI/Subjective: No events overnight.   Objective: Filed Vitals:   03/06/13 2152 03/07/13 0614 03/07/13 0617 03/07/13 0900  BP: 161/65  129/66   Pulse: 96  65   Temp: 97.7 F (36.5 C)  97.7 F (36.5 C)   TempSrc: Oral  Oral   Resp: 18  18   Height:      Weight:  80.922 kg (178 lb 6.4 oz)    SpO2: 96%  97% 98%    Intake/Output Summary (Last 24 hours) at 03/07/13 1001 Last data filed at 03/07/13 0000  Gross per 24  hour  Intake    963 ml  Output      0 ml  Net    963 ml    Exam:   General:  Pt is alert, follows commands appropriately, not in acute distress  Cardiovascular: Regular rate and rhythm, S1/S2, no murmurs, no rubs, no gallops  Respiratory: Course breath sounds at bases with expiratory wheezing   Abdomen: Soft, non tender, non distended, bowel sounds present, no guarding  Extremities: No edema, pulses DP and PT  palpable bilaterally  Neuro: Grossly nonfocal  Data Reviewed: Basic Metabolic Panel:  Recent Labs Lab 03/03/13 0550 03/04/13 0600 03/05/13 0550 03/06/13 0552 03/06/13 0924 03/06/13 1718 03/07/13 0545 03/07/13 0808  NA 137 137 139 135*  --   --  132*  --   K 4.0 4.8 4.5 4.9  --   --  5.0  --   CL 103 104 107 100  --   --  99  --   CO2 20 18* 19 17*  --   --  19  --   GLUCOSE 62* 157* 121* 461* 520* 558* 536* 566*  BUN 28* 29* 32* 35*  --   --  46*  --   CREATININE 2.20* 2.34* 2.38* 2.31*  --   --  2.19*  --   CALCIUM 8.9 9.0 8.9 9.1  --   --  8.9  --    Liver Function Tests:  Recent Labs Lab 03/02/13 1050  AST 38*  ALT 21  ALKPHOS 35*  BILITOT 0.4  PROT 8.2  ALBUMIN 4.0    Recent Labs Lab 03/02/13 1050  LIPASE 22   \CBC:  Recent Labs Lab 03/02/13 1050 03/03/13 0550 03/04/13 0600 03/05/13 0550 03/06/13 0552 03/07/13 0545  WBC 6.6 4.3 4.0 3.6* 5.0 8.4  NEUTROABS 5.1  --   --   --   --   --   HGB 13.0 11.2* 11.4* 10.8* 11.7* 10.8*  HCT 38.6 35.3* 34.3* 33.7* 35.4* 32.4*  MCV 95.8 96.2 96.3 97.1 95.7 94.7  PLT 156 149* 143* 129* 144* 167   Cardiac Enzymes:  Recent Labs Lab 03/02/13 1050  TROPONINI <0.30   CBG:  Recent Labs Lab 03/05/13 1942 03/06/13 0728 03/06/13 1140 03/06/13 1637 03/06/13 2150  GLUCAP 395* 465* 533* 503* 465*    Recent Results (from the past 240 hour(s))  CULTURE, BLOOD (ROUTINE X 2)     Status: None   Collection Time    03/02/13  1:39 PM      Result Value Range Status   Specimen Description BLOOD LEFT THUMB   Final   Special Requests BOTTLES DRAWN AEROBIC AND ANAEROBIC 5CC   Final   Culture  Setup Time     Final   Value: 03/02/2013 16:16     Performed at [facility]   Culture     Final   Value:        BLOOD CULTURE RECEIVED NO GROWTH TO DATE CULTURE WILL BE HELD FOR 5 DAYS BEFORE ISSUING A FINAL NEGATIVE REPORT     Performed at [facility]   Report Status PENDING   Incomplete  CULTURE, BLOOD  (ROUTINE X 2)     Status: None   Collection Time    03/02/13  1:44 PM      Result Value Range Status   Specimen Description BLOOD LEFT HAND   Final   Special Requests BOTTLES DRAWN AEROBIC AND ANAEROBIC 5CC   Final   Culture  Setup Time     Final   Value: 03/02/2013 16:16     Performed at [facility]   Culture     Final   Value:        BLOOD CULTURE RECEIVED NO GROWTH TO DATE CULTURE WILL BE HELD FOR 5 DAYS BEFORE ISSUING A FINAL NEGATIVE REPORT     Performed at [facility]   Report Status PENDING   Incomplete     Scheduled Meds: . albuterol  2.5 mg Nebulization QID  . amLODipine  5 mg Oral Daily  . aspirin  81 mg Oral Daily  . atorvastatin  20 mg Oral QHS  . brinzolamide  1 drop Both Eyes BID   And  . brimonidine  1 drop Both Eyes BID  . fenofibrate  160 mg Oral Daily  . ferrous sulfate  325 mg Oral BID WC  . heparin  5,000 Units Subcutaneous Q8H  . insulin aspart  0-15 Units Subcutaneous TID WC  . insulin aspart  0-5 Units Subcutaneous QHS  . insulin aspart  4 Units Subcutaneous TID WC  . insulin NPH Human  60 Units Subcutaneous QAC breakfast  . levofloxacin  500 mg Oral Q48H  . levothyroxine  150 mcg Oral QAC breakfast  . methylPREDNISolone (SOLU-MEDROL) injection  40 mg Intravenous Q12H  . sodium chloride  3 mL Intravenous Q12H   Continuous Infusions:    Faye Ramsay, MD  [facility] Pager [phone]  If 7PM-7AM, please contact night-coverage [email or web address] Password [email or web address] 03/07/2013, 10:01 AM   LOS: 5 days

## 2013-03-07 NOTE — Progress Notes (Signed)
PT demonstrated verbal and hands on understanding of Flutter device. 

## 2013-03-08 DIAGNOSIS — J9600 Acute respiratory failure, unspecified whether with hypoxia or hypercapnia: Secondary | ICD-10-CM

## 2013-03-08 LAB — CULTURE, BLOOD (ROUTINE X 2)
Culture: NO GROWTH
Culture: NO GROWTH

## 2013-03-08 LAB — LEGIONELLA ANTIGEN, URINE: Legionella Antigen, Urine: NEGATIVE

## 2013-03-08 LAB — GLUCOSE, CAPILLARY
GLUCOSE-CAPILLARY: 170 mg/dL — AB (ref 70–99)
Glucose-Capillary: 276 mg/dL — ABNORMAL HIGH (ref 70–99)
Glucose-Capillary: 162 mg/dL — ABNORMAL HIGH (ref 70–99)
Glucose-Capillary: 254 mg/dL — ABNORMAL HIGH (ref 70–99)
Glucose-Capillary: 269 mg/dL — ABNORMAL HIGH (ref 70–99)

## 2013-03-08 LAB — BASIC METABOLIC PANEL
BUN: 51 mg/dL — ABNORMAL HIGH (ref 6–23)
CO2: 20 meq/L (ref 19–32)
CREATININE: 1.9 mg/dL — AB (ref 0.50–1.10)
Calcium: 9 mg/dL (ref 8.4–10.5)
Chloride: 106 mEq/L (ref 96–112)
GFR calc Af Amer: 28 mL/min — ABNORMAL LOW (ref >90)
GFR calc non Af Amer: 24 mL/min — ABNORMAL LOW (ref >90)
Glucose, Bld: 191 mg/dL — ABNORMAL HIGH (ref 70–99)
POTASSIUM: 3.9 meq/L (ref 3.7–5.3)
Sodium: 140 mEq/L (ref 137–147)

## 2013-03-08 LAB — CBC
HEMATOCRIT: 30.8 % — AB (ref 36.0–46.0)
Hemoglobin: 10.3 g/dL — ABNORMAL LOW (ref 12.0–15.0)
MCH: 31.6 pg (ref 26.0–34.0)
MCHC: 33.4 g/dL (ref 30.0–36.0)
MCV: 94.5 fL (ref 78.0–100.0)
PLATELETS: 165 10*3/uL (ref 150–400)
RBC: 3.26 MIL/uL — AB (ref 3.87–5.11)
RDW: 13.2 % (ref 11.5–15.5)
WBC: 9.1 10*3/uL (ref 4.0–10.5)

## 2013-03-08 LAB — STREP PNEUMONIAE URINARY ANTIGEN: Strep Pneumo Urinary Antigen: NEGATIVE

## 2013-03-08 MED ORDER — INSULIN NPH (HUMAN) (ISOPHANE) 100 UNIT/ML ~~LOC~~ SUSP
60.0000 [IU] | Freq: Every day | SUBCUTANEOUS | Status: DC
  Administered 2013-03-09: 60 [IU] via SUBCUTANEOUS

## 2013-03-08 MED ORDER — NON FORMULARY: 1.0000 [drp] | Freq: Two times a day (BID) | Status: DC

## 2013-03-08 MED ORDER — BRINZOLAMIDE-BRIMONIDINE 1-0.2 % OP SUSP
1.0000 [drp] | Freq: Two times a day (BID) | OPHTHALMIC | Status: DC
  Administered 2013-03-08 – 2013-03-09 (×2): 1 [drp] via OPHTHALMIC

## 2013-03-08 MED ORDER — PREDNISONE 20 MG PO TABS
30.0000 mg | ORAL_TABLET | Freq: Every day | ORAL | Status: DC
  Filled 2013-03-08: qty 1

## 2013-03-08 MED ORDER — PREDNISONE 20 MG PO TABS
20.0000 mg | ORAL_TABLET | Freq: Every day | ORAL | Status: DC
  Administered 2013-03-09: 20 mg via ORAL
  Filled 2013-03-08 (×2): qty 1

## 2013-03-08 NOTE — Progress Notes (Signed)
Patient ID: LUCI REI, female   DOB: Jul 05, 1930, 80 y.o.   MRN: [id number] [facility] PROGRESS NOTE  TYNECIA CORTIJO [id number] DOB: Sep 08, 1930 DOA: 03/02/2013 PCP: Noralee Space, MD  Brief narrative:  80 year old female with a history of diabetes mellitus2, hyperlipidemia, hypertension, hypothyroidism presents with approximately three-day history of fever, generalized weakness, coughing, shortness of breath. The patient has been having fevers and chills at home without taking her temperature. She has had some coughing with yellow-green sputum. Denies any hemoptysis, chest discomfort, vomiting, diarrhea, abdominal pain, dysuria, hematuria. She has had some nausea without emesis. She has also been complaining of a headache with myalgias. She called her primary care provider whom started her on Levaquin of which she has taken 2 doses without much relief.   In the ED, the patient was noted to have temperature 102.8 and mild tachycardia with a heart rate of 102. The patient was given 1 L of normal saline. BMP was unremarkable except for serum creatinine 2.03. CBC showed borderline platelets of 156,000. WBC was 6.6. Initial troponin was negative. Lactic acid was 1.3. EKG showed sinus rhythm with nonspecific T-wave changes and first degree AV block. Influenza PCR was ordered.   Assessment/Plan:  SIRS with Influenza Like Illness  - Influenza PCR positive  - Started Tamiflu and pt has completed therapy but with failure to improve clinically, still wheezing on exam despite IV Steroids 01/24 - 01/26 - pt is clinically improving and with no wheezing this AM  - Blood cultures x2 sets negative to date  - completed Vancomycin for 5 days and transitioned to oral Levaquin 01/25, continue until 01/30 - repeat CXR indicated improved aeration 01/26 with no new or active cardiopulmonary findings  - appreciate input form PCCM  Acute respiratory failure  - secondary to influenza  - ? COPD with  vascular congestion  - 2 D ECHO with grade I diastolic dysfunction  - now successfully weaning off IV steroids, pt on Prednisone and clinically improving with no wheezing this AM Acute on chronic renal failure (CKD stage III)  - Baseline creatinine 1.6-1.7, Cr is trending down slowly  - d/c'd IVF 01/22 to avoid volume overload, close monitoring of renal function  - BMP in AM  Hypertension  - Continue amlodipine  Diabetes mellitus type 2  - Hemoglobin A1c 7.0 on 02/14/2013  - increased dose of NPH insulin as pt was on Solumedrol  - now transitioned to oral Prednisone and CBG better controlled  - readjust the insulin regimen as indicated, may need to change scale back to sensitive in AM Lower extremity pain and edema  - Venous duplex negative for DVT   Consultants:  None Procedures/Studies:  Dg Chest 2 View 03/02/2013 Low lung volumes without radiographic evidence of acute cardiopulmonary disease. Atherosclerosis.  Antibiotics/Antivirals:  Vancomycin 01/21 --> 01/25  Zosyn 01/21 --> 01/25  Tamiflu 01/21 --> 01/26 Levaquin 01/25 --> 01/30  Code Status: Full  Family Communication: Pt and daughter at bedside  Disposition Plan: Remains inpatient   HPI/Subjective: No events overnight.   Objective: Filed Vitals:   03/08/13 0700 03/08/13 0906 03/08/13 0949 03/08/13 1312  BP: 157/66  158/71 123/63  Pulse: 79  97 79  Temp: 98.6 F (37 C)   99 F (37.2 C)  TempSrc: Oral   Oral  Resp: 18   18  Height:      Weight: 80.786 kg (178 lb 1.6 oz)     SpO2: 97% 99%  96%    Intake/Output Summary (  Last 24 hours) at 03/08/13 1808 Last data filed at 03/08/13 1313  Gross per 24 hour  Intake    720 ml  Output    200 ml  Net    520 ml    Exam:   General:  Pt is alert, follows commands appropriately, not in acute distress  Cardiovascular: Regular rate and rhythm, S1/S2, no murmurs, no rubs, no gallops  Respiratory: Clear to auscultation bilaterally, no wheezing, diminished breath  sounds at bases   Abdomen: Soft, non tender, non distended, bowel sounds present, no guarding  Extremities: No edema, pulses DP and PT palpable bilaterally  Neuro: Grossly nonfocal  Data Reviewed: Basic Metabolic Panel:  Recent Labs Lab 03/04/13 0600 03/05/13 0550 03/06/13 0552  03/07/13 0545 03/07/13 0808 03/07/13 1154 03/07/13 1823 03/08/13 0510  NA 137 139 135*  --  132*  --   --   --  140  K 4.8 4.5 4.9  --  5.0  --   --   --  3.9  CL 104 107 100  --  99  --   --   --  106  CO2 18* 19 17*  --  19  --   --   --  20  GLUCOSE 157* 121* 461*  < > 536* 566* 542* 451* 191*  BUN 29* 32* 35*  --  46*  --   --   --  51*  CREATININE 2.34* 2.38* 2.31*  --  2.19*  --   --   --  1.90*  CALCIUM 9.0 8.9 9.1  --  8.9  --   --   --  9.0  < > = values in this interval not displayed. Liver Function Tests:  Recent Labs Lab 03/02/13 1050  AST 38*  ALT 21  ALKPHOS 35*  BILITOT 0.4  PROT 8.2  ALBUMIN 4.0    Recent Labs Lab 03/02/13 1050  LIPASE 22   CBC:  Recent Labs Lab 03/02/13 1050  03/04/13 0600 03/05/13 0550 03/06/13 0552 03/07/13 0545 03/08/13 0510  WBC 6.6  < > 4.0 3.6* 5.0 8.4 9.1  NEUTROABS 5.1  --   --   --   --   --   --   HGB 13.0  < > 11.4* 10.8* 11.7* 10.8* 10.3*  HCT 38.6  < > 34.3* 33.7* 35.4* 32.4* 30.8*  MCV 95.8  < > 96.3 97.1 95.7 94.7 94.5  PLT 156  < > 143* 129* 144* 167 165  < > = values in this interval not displayed. Cardiac Enzymes:  Recent Labs Lab 03/02/13 1050  TROPONINI <0.30   CBG:  Recent Labs Lab 03/07/13 1129 03/07/13 1636 03/07/13 2201 03/08/13 0748 03/08/13 1130  GLUCAP 496* 427* 276* 162* 170*    Recent Results (from the past 240 hour(s))  CULTURE, BLOOD (ROUTINE X 2)     Status: None   Collection Time    03/02/13  1:39 PM      Result Value Range Status   Specimen Description BLOOD LEFT THUMB   Final   Special Requests BOTTLES DRAWN AEROBIC AND ANAEROBIC 5CC   Final   Culture  Setup Time     Final   Value:  03/02/2013 16:16     Performed at [facility]   Culture     Final   Value: NO GROWTH 5 DAYS     Performed at [facility]   Report Status 03/08/2013 FINAL   Final  CULTURE, BLOOD (ROUTINE  X 2)     Status: None   Collection Time    03/02/13  1:44 PM      Result Value Range Status   Specimen Description BLOOD LEFT HAND   Final   Special Requests BOTTLES DRAWN AEROBIC AND ANAEROBIC 5CC   Final   Culture  Setup Time     Final   Value: 03/02/2013 16:16     Performed at [facility]   Culture     Final   Value: NO GROWTH 5 DAYS     Performed at [facility]   Report Status 03/08/2013 FINAL   Final     Scheduled Meds: . amLODipine  5 mg Oral Daily  . aspirin  81 mg Oral Daily  . atorvastatin  20 mg Oral QHS  . Brinzolamide-Brimonidine  1 drop Ophthalmic BID  . budesonide (PULMICORT) nebulizer solution  0.25 mg Nebulization BID  . fenofibrate  160 mg Oral Daily  . ferrous sulfate  325 mg Oral BID WC  . heparin  5,000 Units Subcutaneous Q8H  . insulin aspart  0-20 Units Subcutaneous TID WC  . insulin aspart  0-5 Units Subcutaneous QHS  . insulin aspart  6 Units Subcutaneous TID WC  . insulin NPH Human  80 Units Subcutaneous QAC breakfast  . ipratropium-albuterol  3 mL Nebulization Q4H  . levofloxacin  500 mg Oral Q48H  . levothyroxine  150 mcg Oral QAC breakfast  . [START ON 03/09/2013] predniSONE  30 mg Oral Q breakfast  . sodium chloride  3 mL Intravenous Q12H   Continuous Infusions:  Faye Ramsay, MD  [facility] Pager [phone]  If 7PM-7AM, please contact night-coverage [email or web address] Password [email or web address] 03/08/2013, 6:08 PM   LOS: 6 days

## 2013-03-08 NOTE — Evaluation (Signed)
Physical Therapy Evaluation Patient Details Name: NEIVA MAINI MRN: [id number] DOB: 1930/07/11 Today's Date: 03/08/2013 Time: [id number] PT Time Calculation (min): 23 min  PT Assessment / Plan / Recommendation History of Present Illness  80 yo female admitted with SIRS. Hx of DM, HTN  Clinical Impression  On eval, pt required Min assist for mobility-able to ambulate ~100 feet with 2 hand support. Demonstrates general weakness, decreased activity tolerance, and impaired gait and balance. Recommend HHPT and int supervision. Pt states she has access to walker at home-may need to use this short term.     PT Assessment  Patient needs continued PT services    Follow Up Recommendations  Home health PT;Supervision - Intermittent    Does the patient have the potential to tolerate intense rehabilitation      Barriers to Discharge        Equipment Recommendations  None recommended by PT    Recommendations for Other Services OT consult   Frequency Min 3X/week    Precautions / Restrictions Precautions Precautions: Fall Restrictions Weight Bearing Restrictions: No   Pertinent Vitals/Pain Pt denies pain      Mobility  Bed Mobility Overal bed mobility: Modified Independent Transfers Overall transfer level: Needs assistance Transfers: Stand Pivot Transfers Sit to Stand: Min guard Stand pivot transfers: Min guard General transfer comment: close guard for safety Ambulation/Gait Ambulation/Gait assistance: Min assist Ambulation Distance (Feet): 100 Feet Assistive device:  (pt held onto dynamap) Gait Pattern/deviations: Step-through pattern;Decreased stride length General Gait Details: slow gait speed. dyspnea 2/4. unsteady    Exercises     PT Diagnosis: Difficulty walking;Generalized weakness  PT Problem List: Decreased strength;Decreased activity tolerance;Decreased balance;Decreased mobility;Decreased knowledge of use of DME PT Treatment Interventions: DME instruction;Gait  training;Functional mobility training;Therapeutic activities;Therapeutic exercise;Patient/family education;Balance training     PT Goals(Current goals can be found in the care plan section) Acute Rehab PT Goals Patient Stated Goal: get better then home PT Goal Formulation: With patient/family Time For Goal Achievement: 03/22/13 Potential to Achieve Goals: Good  Visit Information  Last PT Received On: 03/08/13 Assistance Needed: +1 History of Present Illness: 80 yo female admitted with SIRS. Hx of DM, HTN       Prior Functioning  Home Living Family/patient expects to be discharged to:: Private residence Living Arrangements: Children (son lives with pt) Available Help at Discharge: Family Type of Home: Mobile home Home Access: Stairs to enter Technical brewer of Steps: 2 Entrance Stairs-Rails: None Home Equipment: Environmental consultant - 2 wheels Prior Function Level of Independence: Independent Communication Communication: No difficulties    Cognition  Cognition Arousal/Alertness: Awake/alert Behavior During Therapy: WFL for tasks assessed/performed Overall Cognitive Status: Within Functional Limits for tasks assessed    Extremity/Trunk Assessment Upper Extremity Assessment Upper Extremity Assessment: Generalized weakness Lower Extremity Assessment Lower Extremity Assessment: Generalized weakness Cervical / Trunk Assessment Cervical / Trunk Assessment: Normal   Balance    End of Session PT - End of Session Equipment Utilized During Treatment: Gait belt Activity Tolerance: Patient tolerated treatment well Patient left: in bed;with call bell/phone within reach;with family/visitor present  GP     Weston Anna, MPT Pager: [phone]

## 2013-03-08 NOTE — Plan of Care (Signed)
Problem: Phase I Progression Outcomes Goal: OOB as tolerated unless otherwise ordered Outcome: Progressing OOB to [facility] without difficulty.

## 2013-03-08 NOTE — Progress Notes (Signed)
Name: Jocelyn Mendez MRN: [id number] DOB: 27-Apr-1930    ADMISSION DATE:  03/02/2013 CONSULTATION DATE:  1/26 LOs 6 days  REFERRING MD :  [facility] PRIMARY SERVICE: [facility]  CHIEF COMPLAINT:  SOB  BRIEF PATIENT DESCRIPTION:  80 yo remote smoker with known DM, HTN, second hand smoke exposure who reports 3 days of FCS, yellow sputum and increasing DOE prior to her admission to [facility]. She is Flu A ++ and currently on O2 at 2 l/m.  SIGNIFICANT EVENTS / STUDIES:    LINES / TUBES:   CULTURES: 1/21 flu A>>POS  ANTIBIOTICS: 1/22 levaquin>>  HISTORY OF PRESENT ILLNESS:  80 yo remote smoker with known DM, HTN, second hand smoke exposure who reports 3 days of FCS, yellow sputum and increasing DOE prior to her admission to [facility]. She is Flu A ++ and currently on O2 at 2 l/m. Notable to have sbp >120 and in NAD at rest on exam. PCCM asked to evaluate due to failure to improve despite 5d course of tamiflu. TRH Dr Magick-Myers having difficulty weaning off steroids  SUBJECTIVE: no CP, dyspnea, feels better  VITAL SIGNS: Temp:  [97.8 F (36.6 C)-98.6 F (37 C)] 98.6 F (37 C) (01/27 0700) Pulse Rate:  [79-97] 97 (01/27 0949) Resp:  [18] 18 (01/27 0700) BP: (128-167)/(36-71) 158/71 mmHg (01/27 0949) SpO2:  [97 %-99 %] 99 % (01/27 0906) Weight:  [178 lb 1.6 oz (80.786 kg)] 178 lb 1.6 oz (80.786 kg) (01/27 0700) HEMODYNAMICS:   VENTILATOR SETTINGS:   INTAKE / OUTPUT: Intake/Output     01/26 0701 - 01/27 0700 01/27 0701 - 01/28 0700   P.O. 1080 240   I.V. (mL/kg) 6 (0.1)    Total Intake(mL/kg) 1086 (13.4) 240 (3)   Urine (mL/kg/hr) 260 (0.1)    Total Output 260     Net +826 +240        Urine Occurrence 4 x    Stool Occurrence 1 x      PHYSICAL EXAMINATION: General:  Disheveled white female in NAD, looks better Neuro:  Appears intact. MAEx4. Speech clear HEENT: No LAN/JVD Cardiovascular:  HSR RRR Lungs:  Decreased air movement, no wheezes appreciated Abdomen: Soft  +bs Musculoskeletal:  Intact Skin:  Warm , no edema  LABS:  CBC  Recent Labs Lab 03/06/13 0552 03/07/13 0545 03/08/13 0510  WBC 5.0 8.4 9.1  HGB 11.7* 10.8* 10.3*  HCT 35.4* 32.4* 30.8*  PLT 144* 167 165   Coag's No results found for this basename: APTT, INR,  in the last 168 hours BMET  Recent Labs Lab 03/06/13 0552  03/07/13 0545  03/07/13 1154 03/07/13 1823 03/08/13 0510  NA 135*  --  132*  --   --   --  140  K 4.9  --  5.0  --   --   --  3.9  CL 100  --  99  --   --   --  106  CO2 17*  --  19  --   --   --  20  BUN 35*  --  46*  --   --   --  51*  CREATININE 2.31*  --  2.19*  --   --   --  1.90*  GLUCOSE 461*  < > 536*  < > 542* 451* 191*  < > = values in this interval not displayed. Electrolytes  Recent Labs Lab 03/06/13 0552 03/07/13 0545 03/08/13 0510  CALCIUM 9.1 8.9 9.0   Sepsis  Markers  Recent Labs Lab 03/02/13 1050  LATICACIDVEN 1.3   ABG No results found for this basename: PHART, PCO2ART, PO2ART,  in the last 168 hours Liver Enzymes  Recent Labs Lab 03/02/13 1050  AST 38*  ALT 21  ALKPHOS 35*  BILITOT 0.4  ALBUMIN 4.0   Cardiac Enzymes  Recent Labs Lab 03/02/13 1050 03/05/13 0550  TROPONINI <0.30  --   PROBNP  --  491.6*   Glucose  Recent Labs Lab 03/07/13 0734 03/07/13 1129 03/07/13 1636 03/07/13 2201 03/08/13 0748 03/08/13 1130  GLUCAP 487* 496* 427* 276* 162* 170*    Imaging Dg Chest 2 View  03/07/2013   CLINICAL DATA:  Cough and shortness of breath  EXAM: CHEST  2 VIEW  COMPARISON:  03/05/2013  FINDINGS: The cardiac shadow is stable. The lungs are clear bilaterally. No focal infiltrate or sizable effusion is noted.  IMPRESSION: No acute infiltrate or sizable effusion.   Electronically Signed   By: Inez Catalina M.D.   On: 03/07/2013 15:47     CXR: 1/24 No active disease. Improved aeration since prior exam. Duplex neg  ASSESSMENT   Acute respiratory failure with hypoxia - INFLUENZA   ASTHMATIC BRONCHITIS,  ACUTE    SIRS (systemic inflammatory response syndrome)    HYPOTHYROIDISM    HYPERLIPIDEMIA    ANEMIA OF CHRONIC DISEASE    ANXIETY    HYPERTENSION    RENAL INSUFFICIENCY -Acute on chronic    Fever    Diabetes mellitus type 2, insulin dependent    Acute on chronic renal failure     PLAN: O2 as needed BD scheduled; add atrovent. Overal increase frequency Levaquin for bronchitis Antivirals s/p 5 days  - so wont restart Drop prednisone to 30 mg -taper over 1 week    PCCM to sign off, she can FU with dr Lenna Gilford on discharge  Kara Mead MD. FCCP. [facility] Pager [phone] If no response call [phone]   03/08/2013, 11:33 AM

## 2013-03-09 DIAGNOSIS — E119 Type 2 diabetes mellitus without complications: Secondary | ICD-10-CM

## 2013-03-09 DIAGNOSIS — Z794 Long term (current) use of insulin: Secondary | ICD-10-CM

## 2013-03-09 LAB — CBC
HCT: 32 % — ABNORMAL LOW (ref 36.0–46.0)
HEMOGLOBIN: 10.5 g/dL — AB (ref 12.0–15.0)
MCH: 31.1 pg (ref 26.0–34.0)
MCHC: 32.8 g/dL (ref 30.0–36.0)
MCV: 94.7 fL (ref 78.0–100.0)
PLATELETS: 178 10*3/uL (ref 150–400)
RBC: 3.38 MIL/uL — ABNORMAL LOW (ref 3.87–5.11)
RDW: 13.3 % (ref 11.5–15.5)
WBC: 9.1 10*3/uL (ref 4.0–10.5)

## 2013-03-09 LAB — BASIC METABOLIC PANEL
BUN: 46 mg/dL — ABNORMAL HIGH (ref 6–23)
CO2: 20 mEq/L (ref 19–32)
Calcium: 8.9 mg/dL (ref 8.4–10.5)
Chloride: 101 mEq/L (ref 96–112)
Creatinine, Ser: 1.69 mg/dL — ABNORMAL HIGH (ref 0.50–1.10)
GFR calc Af Amer: 32 mL/min — ABNORMAL LOW (ref >90)
GFR, EST NON AFRICAN AMERICAN: 27 mL/min — AB (ref >90)
Glucose, Bld: 337 mg/dL — ABNORMAL HIGH (ref 70–99)
Potassium: 4.5 mEq/L (ref 3.7–5.3)
SODIUM: 134 meq/L — AB (ref 137–147)

## 2013-03-09 LAB — RESPIRATORY VIRUS PANEL
Adenovirus: NOT DETECTED
INFLUENZA A H3: NOT DETECTED
INFLUENZA B 1: NOT DETECTED
Influenza A H1: NOT DETECTED
Influenza A: NOT DETECTED
Metapneumovirus: NOT DETECTED
Parainfluenza 1: NOT DETECTED
Parainfluenza 2: NOT DETECTED
Parainfluenza 3: NOT DETECTED
RESPIRATORY SYNCYTIAL VIRUS A: NOT DETECTED
RESPIRATORY SYNCYTIAL VIRUS B: NOT DETECTED
Rhinovirus: NOT DETECTED

## 2013-03-09 LAB — GLUCOSE, CAPILLARY
Glucose-Capillary: 295 mg/dL — ABNORMAL HIGH (ref 70–99)
Glucose-Capillary: 236 mg/dL — ABNORMAL HIGH (ref 70–99)

## 2013-03-09 MED ORDER — INSULIN ISOPHANE HUMAN 100 UNIT/ML KWIKPEN: 80.0000 [IU] | PEN_INJECTOR | SUBCUTANEOUS | Status: DC

## 2013-03-09 MED ORDER — ALBUTEROL SULFATE HFA 108 (90 BASE) MCG/ACT IN AERS: 2.0000 | INHALATION_SPRAY | Freq: Four times a day (QID) | RESPIRATORY_TRACT | Status: DC | PRN

## 2013-03-09 MED ORDER — INSULIN ASPART 100 UNIT/ML FLEXPEN: 10.0000 [IU] | PEN_INJECTOR | Freq: Three times a day (TID) | SUBCUTANEOUS | Status: DC

## 2013-03-09 MED ORDER — LEVOFLOXACIN 500 MG PO TABS: 500.0000 mg | ORAL_TABLET | Freq: Every day | ORAL | Status: DC

## 2013-03-09 MED ORDER — PREDNISONE 10 MG PO TABS: 20.0000 mg | ORAL_TABLET | Freq: Every day | ORAL | Status: DC

## 2013-03-09 NOTE — Progress Notes (Signed)
Inpatient Diabetes Program Recommendations  AACE/ADA: New Consensus Statement on Inpatient Glycemic Control (2013)  Target Ranges:  Prepandial:   less than 140 mg/dL      Peak postprandial:   less than 180 mg/dL (1-2 hours)      Critically ill patients:  140 - 180 mg/dL   Reason for Visit: Instruction on Novolog meal coverage insulin at discharge.  Pt to go home on NPH 80 units QAM and Novolog 10 units tidwc for meal coverage insulin. Discussed importance of eating meal and then immediately taking Novolog after meal.  Instructed Do Not Take Novolog if pt is not eating well. Pt and daughter voiced understanding. Discussed hypoglycemia s/s and treatment. Pt treats low blood sugars with OJ and f/u with peanut butter sandwich. States she has very few low blood sugars. Will f/u with Dr. Loanne Drilling for diabetes management.  Thank you. Lorenda Peck, RD, LDN, CDE Inpatient Diabetes Coordinator [phone]

## 2013-03-09 NOTE — Progress Notes (Signed)
Inpatient Diabetes Program Recommendations  AACE/ADA: New Consensus Statement on Inpatient Glycemic Control (2013)  Target Ranges:  Prepandial:   less than 140 mg/dL      Peak postprandial:   less than 180 mg/dL (1-2 hours)      Critically ill patients:  140 - 180 mg/dL   Reason for Visit: Hyperglycemia  Diabetes history: Type 2 Outpatient Diabetes medications: NPH145 units QAM Current orders for Inpatient glycemic control: NPH60 units QAM, Novolog resistant tidwc and hs, Novolog 6 units tidwc for meal coverage  Results for Jocelyn Mendez, Jocelyn Mendez (MRN [id number]) as of 03/09/2013 10:20  Ref. Range 03/08/2013 07:48 03/08/2013 11:30 03/08/2013 16:35 03/08/2013 21:52 03/09/2013 07:47  Glucose-Capillary Latest Range: 70-99 mg/dL 162 (H) 170 (H) 254 (H) 269 (H) 295 (H)  Steroids decreased to Prednisone 20 mg QAM. NPH dose lowered. Recommendations: Increase Novolog meal coverage to 10 units tidwc.  Note: Pt is only on NPH insulin at home.  Needs meal coverage correction insulin ordered for discharge for improved glycemic control at home.  Thank you. Lorenda Peck, RD, LDN, CDE Inpatient Diabetes Coordinator [phone]

## 2013-03-09 NOTE — Progress Notes (Signed)
SATURATION QUALIFICATIONS: (This note is used to comply with regulatory documentation for home oxygen)  Patient Saturations on Room Air at Rest = 97%  Patient Saturations on Room Air while Ambulating = 96%  Patient Saturations on 0 Liters of oxygen while Ambulating = 96%  Please briefly explain why patient needs home oxygen: 

## 2013-03-13 ENCOUNTER — Other Ambulatory Visit: Payer: Self-pay | Admitting: Pulmonary Disease

## 2013-03-21 ENCOUNTER — Ambulatory Visit (INDEPENDENT_AMBULATORY_CARE_PROVIDER_SITE_OTHER): Payer: Medicare Other | Admitting: Adult Health

## 2013-03-21 ENCOUNTER — Other Ambulatory Visit: Payer: Self-pay | Admitting: Endocrinology

## 2013-03-21 ENCOUNTER — Encounter: Payer: Self-pay | Admitting: Adult Health

## 2013-03-21 VITALS — BP 124/64 | HR 69 | Temp 98.8°F | Ht 66.0 in | Wt 174.8 lb

## 2013-03-21 DIAGNOSIS — J209 Acute bronchitis, unspecified: Secondary | ICD-10-CM

## 2013-03-21 DIAGNOSIS — E1065 Type 1 diabetes mellitus with hyperglycemia: Secondary | ICD-10-CM

## 2013-03-21 DIAGNOSIS — E1029 Type 1 diabetes mellitus with other diabetic kidney complication: Secondary | ICD-10-CM

## 2013-03-21 DIAGNOSIS — J111 Influenza due to unidentified influenza virus with other respiratory manifestations: Secondary | ICD-10-CM

## 2013-03-21 DIAGNOSIS — E039 Hypothyroidism, unspecified: Secondary | ICD-10-CM

## 2013-03-21 DIAGNOSIS — R69 Illness, unspecified: Principal | ICD-10-CM

## 2013-03-21 NOTE — Patient Instructions (Signed)
Continue on current regimen .  Call Dr. Loanne Drilling office to discuss blood sugars .  follow up Dr. Lenna Gilford  As planned and As needed

## 2013-03-21 NOTE — Progress Notes (Signed)
Subjective:    Patient ID: Jocelyn Mendez, female    DOB: March 04, 1930, 80 y.o.   MRN: [id number]  HPI 80 y/o WF - has mult med problems including:  HBP, Hypercholesterolemia, DM, mild renal insuffic, & multifactorial anemia w/ B12 defic on shots monthly... she also has DJD, LBP, Osteopenia, etc...  << see prev notes for earlier entries >>   03/21/2013 Post hospital followup Patient returns for a post hospital followup. She was admitted January 21 through January 28 for acute respiratory failure secondary to influenza and COPD, exacerbation. She was treated with a full course of Tamiflu Blood cultures were negative. She was treated with aggressive IV antibiotics, steroids and transitioned over to Levaquin and a steroid taper Prior to discharge. Chest x-ray showed no sign of pneumonia. 2-D echo showed a grade 1 diastolic dysfunction. Blood sugars were elevated during her hospitalization and her insulin was adjusted. Patient did have some lower extremity edema and underwent a venous Doppler was negative for DVT Patient was discharged on Levaquin, and steroids, which she is now finished. Since discharge. Patient reports that she is feeling improved with decreased cough, congestion, and shortness, of breath. She denies any hemoptysis, orthopnea, PND, or leg swelling. Continues to feel weak with low energy. Also, complains of blood sugars have been up and down. Since discharge. She is followed by endocrinology with Dr. Loanne Drilling for her diabetes.            Problem List:  Hx of ASTHMATIC BRONCHITIS, ACUTE (ICD-466.0) - no recent bronchitic episodes... the increased activity has really helped her... ~  CXR 5/12 showed clear lungs x mild scarring left base & apical pleural thickening... ~  CXR 6/13 showed norm heart size, min streaky atx at basesw/ sl peribronchial thickening c/w bronchitis, otherw clear, NAD...  HYPERTENSION (ICD-401.9) - prev on Diovan/HCT but this was stopped 4/12 Hosp & now on  NORVASC 5mg /d... ~  10/12:  BP= 128/62 & she denies CP, palpit, syncope, SOB, edema... ~  2/13:  BP= 130/70 & she feels well & denies symptoms... ~  4/13:  DrGoldsborough started LASIX 20mg /d as needed for swelling... ~  6/13:  BP= 120/60 & she notes some CP from her cough, but no palpit/ ch in SOB, etc... ~  8/13:  BP= 130/60 & although she c/o left CWP, she denies palpit, ch in SOB, recent edema, etc... ~  12/13: on Amlod5, Lasix20mg  prn swelling; BP= 142/70 & she denies CP, palpit, ch in SOB, edema, etc...  Hx of CHEST PAIN (ICD-786.50) - on ASA 81mg /d;  she continues to experience intermittent sharp left CWP- Rx w/ Tylenol Prn. ~  cath 1988 showed normal coronaries, min MVP... ~  Cards eval 4/12 by DrNishan> mult risk factors, atypCP, EKG w/ NSSTTWA, planned Myoview but not done due to subseq hosp 4/12... ~  8/13:  She is c/o left CWP- sharp, tender in left axillary area, etc; discussed rest, heat, Tylenol (she does not want stronger pain med).  DM (ICD-250.00) - followed by DrEllison> DM w/ Nephropathy & Neuropathy> on HUMULIN N 100u daily; (off Metformin rx since 4/12 hosp)... ~  labs 8/08 showed BS= 113, HgA1c=5.4.Marland KitchenMarland Kitchen On Metform + Glimep. ~  labs 4/09 showed BS= 99, HgA1c= 5.5.Marland KitchenMarland Kitchen Stopped Glimep. ~  labs 4/10 showed BS= 125, A1c= 7.1.Marland Kitchen. rec> better diet, same meds for now. ~  labs 4/11 showed BS= 266, A1c= 9.1.Marland KitchenMarland Kitchen what happened? Add Glimep1mg /d back. ~  labs 6/11 showed BS= 195, A1c= 7.9.Marland KitchenMarland Kitchen incr Glimep to 2mg /d. ~  labs 10/11 showed BS= 175, A1c= 7.9.Marland KitchenMarland Kitchen rec incr Gimep to 4mg /d. ~  Labs in [facility] 5/12 showed BS= 232==>139; A1c=9.7; Metform stopped in hosp, on [place]. ~  Labs 5/12 post hosp showed BS= 255, A1c= 8.6.Marland KitchenMarland Kitchen Referred to Endocrinology. ~  7/12:  BS at home all in the 200s, she doesn't want additional meds, wants to wait for appt w/ DrKerr 7/24. ~  8/12:  She was seen by DrKerr & her started her on Levemir insulin + Onglyza 2.5mg /d ==> his notes & labs are reviewed... ~   10/12: Note from DrKerr> pt on Levemir 80u + Onglyza 2.5mg /d w/ A1c= 9.0.Marland Kitchen. ~  Labs here 2/13 showed FBS= 229, A1c= 11.0 & we will fax to DrKerr for his review... ~  Pt switched to DrEllison in the interval> meds changed to HUMULIN N & now on 100u daily w/ recent A1c=8.9.Marland Kitchen. ~  Labs 8/13 on HumulinN100/d showed BS= 89, A1c= 8.8... We reviewed diet, exercise, & f/u w/ DrEllison.  HYPERLIPIDEMIA (ICD-272.4) - on LIPITOR 20mg /d & FENOFIBRATE 160mg /d (ch from Lopid 10/10). ~  FLP 8/08 showed TChol 129, TG 197, HDL 17, LDL 73... despite taking statin & fibrate drugs. ~  FLP 4/09 on Lip20+Tric145 showed TChol 127, TG 269, HDL 13, LDL 69... rec> ch Tricor to [phone]. ~  [facility] 10/09 on Lip20+Trilip135 showed TChol 124, TG 247, HDL 10!, LDL 64... rec> ch Trilip to LopidBid. ~  FLP 4/10 on Lip20+LopidBid showed TChol 174, TG 239, HDL 38, LDL 103... rec> ch Lopid to Feno160 (she never did). ~  FLP 10/10 showed TChol 134, TG 256, HDL 10, LDL 76... change the Lopid to Fenofib160/d. ~  FLP 4/11 on Lip20+Feno160 showed TChol 163, TG 234, HDL 28, LDL 108... keep same, better diet! ~  FLP 10/11 on Lip20+Feno160 showed TChol 172, Tg 304, HDL 26, LDL 112... take meds regularly & better diet. ~  FLP 4/12 on Lip20+Feno160 showed TChol 160, TG 258, HDL 19, LDL 100... Needs better low fat diet. ~  FLP 2/13 on Lip20+Feno160 showed TChol 147, TG 165, HDL 30, LDL 85 ~  FLP 8/13 on Lip20+Feno160 showed TChol 159, TG 232, HDL 30, LDL 99  HYPOTHYROIDISM (ICD-244.9) - Hx remote Thyroid surg & scar in neck> on SYNTHROID 158mcg/d... ~  last TSH 1/08 = 0.41 ~  labs 4/09 showed TSH= 2.35 ~  labs 10/09 on Levothy125 showed TSH= 0.97 ~  labs 4/10 on Levothy125 showed TSH= 0.08... rec> decr [phone]. ~  labs 10/10 on Levothy112 showed TSH= 2.18... keep same. ~  labs 4/11 on Levo112 showed TSH= 0.51 ~  Labs 4/12 on Levo112 showed TSH= 1.02 ~  Labs 2/13 on Levo112 showed TSH= 20.8.Marland KitchenMarland Kitchen Why? Pharm confirms regular refills, we will  incr dose to 139mcg/d. ~  Labs 8/13 on Levo125 showed TSH= 20.2.Marland KitchenMarland Kitchen We decided to incr LEVOTHYROID 14mcg/d...  GERD (ICD-530.81) - takes [place] 40mg  Prn (states Omep20 "makes me sick")...  ~  EGD was planned in 2006 eval but never done... ~  6/13:  She want to switch to ZANTAC 150mg /d since Nexium is too expensive  DIVERTICULOSIS OF COLON (ICD-562.10) - c/o constip & rec> Miralax + Senakot-S... ~  last colonoscopy 3/06 by DrDBrodie showed divertics only...  RENAL INSUFFICIENCY (ICD-588.9) - prev Creat in the 1.7 - 2.0 range... ~  labs 8/08 showed BUN=26, Creat=1.5 ~  labs 4/09 showed BUN= 34, Creat= 1.5 ~  labs 10/09 showed BUN= 31, Creat= 1.5 ~  labs 4/10 showed BUN= 28, Creat=  1.0, Microalb= pos... continue Rx (on Diovan). ~  labs 10/10 showed BUN= 22, Creat= 1.2 ~  labs 4/11 showed BUN= 20, Creat= 1.5 ~  labs 6/11 showed BUN= 20, Creat= 1.3 ~  labs 10/11 showed BUN= 27, Creat= 1.5 ~  Renal Ultrasound 5/12 was neg- NAD, no hydronephrosis... ~  Labs in [facility] 5/12 showed Creat= 2.11==>1.4 ~  Labs 5/12 post hosp showed BUN=44, Creat=1.7, K=5.7, HCO3=23;  She has appt w/ Nephrology set up by the Hospitalists. ~  6/12 & 10/12:  Pt seen by DrGoldsborough & her notes are reviewed... ~  Labs 2/13 showed BUN= 33, Creat= 1.6 ~  Labs 8/13 showed BUN= 34, Creat= 1.7  DEGENERATIVE JOINT DISEASE, GENERALIZED (ICD-715.00) - she uses Tylenol Prn. LOW BACK PAIN (ICD-724.2) OSTEOPOROSIS (ICD-733.00) - she takes calcium + vits daily... ~  BMD here 2/06 showed TScores -1.4 Spine,  -1.1 left FemNeck... ~  labs 4/09 showed Vit D level= 24... rec OTC Vit D 1000 u daily, but she stopped this. ~  labs 4/10 showed Vit D level = 20... rec> incr OTC Vit D 2000 u daily (she never did). ~  labs 4/11 showed Vit D level = 22...Marland KitchenMarland KitchenMarland Kitchen rec> incr to 2000 u daily. ~  Labs 4/12 showed Vit D level = 32  HEADACHE (ICD-784.0)  ANXIETY (ICD-300.00) - she notes her Bro has emphysema, lung cancer and CHF... she takes  ALPRAZOLAM 0.5mg  Prn.  ANEMIA>  Hx ACD & IronDefic>  Rx FeSO4 + VitC ~  Hx of multifactorial anemia w/ Hg=11 in 2005, Fe was OK, Stools neg for blood, Folate norm, B12 found low, & Creat 1.5+ ~  labs 1/08 showed Hg=11.1 ~  labs 8/08 showed Hg=9.9 ~  labs 4/09 showed Hg= 9.6, Fe=102, TIBC=344, Sat=21% ~  labs 10/09 showed Hg= 9.1, MCV= 92 ~  labs 4/10 showed = 11.1, Fe= 71 ~  labs 10/10 showed Hg= 9.3, MCV= 93... rec Fe Bid w/ Vit C... ~  labs 4/11 showed Hg= 11.8, MCV=87, Fe=86 ~  labs 10/11 showed Hg= 10.9, MCV= 89 ~  Labs in [facility] 5/12 showed Hg= 10.3==>8.6, Fe=19 (5%sat), B12=505, Folate=16, SPE= neg. ~  Labs 2/13 showed Hg= 12.0  VITAMIN B12 DEFICIENCY (ICD-266.2) - Vit B12 level was 77 in 2006 w/ pos parietal cell antibodies; she's been on B12 shots ever since then...  ~  f/u B12 level 4/10 = >1500 ~  f/u B12 level 5/12= 505 ~  She remains on monthly Vit B12 shots...   Past Surgical History  Procedure Laterality Date  . Appendectomy      Outpatient Encounter Prescriptions as of 03/21/2013  Medication Sig  . albuterol (PROVENTIL HFA;VENTOLIN HFA) 108 (90 BASE) MCG/ACT inhaler Inhale 2 puffs into the lungs every 6 (six) hours as needed for wheezing or shortness of breath.  Marland Kitchen amLODipine (NORVASC) 5 MG tablet TAKE 1 TABLET (5 MG TOTAL) BY MOUTH DAILY.  Marland Kitchen aspirin 81 MG tablet Take 81 mg by mouth daily.    Marland Kitchen atorvastatin (LIPITOR) 20 MG tablet Take 20 mg by mouth at bedtime.  . BD PEN NEEDLE NANO U/F 32G X 4 MM MISC USE AS DIRECTED WITH THE HUMULIN PEN INSULIN  . Brinzolamide-Brimonidine (SIMBRINZA) 1-0.2 % SUSP Place 1 drop into both eyes 2 (two) times daily.  . Cholecalciferol (VITAMIN D3) 2000 UNITS TABS Take 1 tablet by mouth 2 (two) times daily.  . cyanocobalamin (,VITAMIN B-12,) 1000 MCG/ML injection Inject 1,000 mcg into the muscle every 30 (thirty) days.  Marland Kitchen  fenofibrate 160 MG tablet Take 160 mg by mouth daily.  . ferrous sulfate 325 (65 FE) MG EC tablet Take 325 mg by mouth 2  (two) times daily.   . furosemide (LASIX) 20 MG tablet Take 1 tablet (20 mg total) by mouth daily.  Marland Kitchen glucose blood test strip 1 each by Other route 4 (four) times daily. Bayer contour test strips  . Insulin Isophane Human (HUMULIN N) 100 UNIT/ML Kiwkpen Inject 145 Units into the skin every morning.  . Insulin Pen Needle 31G X 8 MM MISC 1 each by Does not apply route. Use with insulin pen device, humulin  . Lancets (ONETOUCH ULTRASOFT) lancets Use as instructed  . Lancets 30G MISC 1 each by Does not apply route. Use to check blood sugars four times per day  . levothyroxine (SYNTHROID, LEVOTHROID) 150 MCG tablet Take 150 mcg by mouth daily before breakfast.  . NEEDLE, DISP, 25 G (BD DISP NEEDLES) 25G X 5/8" MISC Use as directed to administer  the b12 injection monthly  . ranitidine (ZANTAC) 150 MG capsule Take 1 capsule by mouth at bedtime.  . [DISCONTINUED] Insulin Isophane Human (HUMULIN N) 100 UNIT/ML Kiwkpen Inject 80 Units into the skin every morning.  . [DISCONTINUED] amLODipine (NORVASC) 5 MG tablet Take 5 mg by mouth daily.  . [DISCONTINUED] insulin aspart (NOVOLOG FLEXPEN) 100 UNIT/ML FlexPen Inject 10 Units into the skin 3 (three) times daily with meals.  . [DISCONTINUED] levofloxacin (LEVAQUIN) 500 MG tablet Take 1 tablet (500 mg total) by mouth daily. For 2 days  . [DISCONTINUED] predniSONE (DELTASONE) 10 MG tablet Take 2 tablets (20 mg total) by mouth daily with breakfast. Take 20mg  for 2days then 10mg  for 2days then STOP    Allergies  Allergen Reactions  . Codeine     REACTION: change in mental status  . Diphenhydramine Hcl     REACTION: itching  . Quinine     REACTION: abnormal sensations in face  . Sulfonamide Derivatives     REACTION: rash    Current Medications, Allergies, Past Medical History, Past Surgical History, Family History, and Social History were reviewed in [employer] record.    Review of Systems Constitutional:   No  weight loss,  night sweats,  Fevers,  fatigue  HEENT:   No headaches,  Difficulty swallowing,  Tooth/dental problems, or  Sore throat,                No sneezing, itching, ear ache,  +nasal congestion, post nasal drip,   CV:  No chest pain,  Orthopnea, PND, swelling in lower extremities, anasarca, dizziness, palpitations, syncope.   GI  No heartburn, indigestion, abdominal pain, nausea, vomiting, diarrhea, change in bowel habits, loss of appetite, bloody stools.   Resp:    No chest wall deformity  Skin: no rash or lesions.  GU: no dysuria, change in color of urine, no urgency or frequency.  No flank pain, no hematuria   MS:  No joint pain or swelling.  No decreased range of motion.  No back pain.  Psych:  No change in mood or affect. No depression or anxiety.  No memory loss.               Objective:   Physical Exam      WD, WN, 80 y/o WF in NAD...   GENERAL:  Alert & oriented; pleasant & cooperative... HEENT:  [place]/AT,  EACs-clear, TMs- sl red on left, NOSE-clear, THROAT-clear & wnl. NECK:  Decr  ROM; no JVD; normal carotid impulses w/o bruits; +thyroid scar, no thyromegaly or nodules palpated; no lymphadenopathy. CHEST:  Clear to P & A; without wheezes/ rales/ or rhonchi heard;  HEART:  Regular Rhythm; without murmurs/ rubs/ or gallops detected... ABDOMEN:  Soft & nontender; normal bowel sounds; no organomegaly or masses palpated... EXT: without deformities, mild arthritic changes; no varicose veins/ +venous insuffic/ no edema. NEURO:   ; no focal neuro deficits or weakness found... DERM:  no lesions noted...    Assessment & Plan:

## 2013-03-21 NOTE — Assessment & Plan Note (Signed)
Recent exacerbation, now resolved. Patient's continue on her current regimen and follow back up with Dr. Lenna Gilford as planned and as As needed

## 2013-03-21 NOTE — Assessment & Plan Note (Signed)
Diabetes currently followed by endocrinology. Patient with recent labile blood sugars with recent illness, and steroids. Patient advised to contact endocrinology for followup visit with blood sugar log.

## 2013-03-21 NOTE — Assessment & Plan Note (Signed)
Check TSH today

## 2013-03-21 NOTE — Assessment & Plan Note (Signed)
Recent influenza, now resolving. Patient continue on her current regimen and follow back up as planned

## 2013-03-24 ENCOUNTER — Telehealth: Payer: Self-pay | Admitting: Pulmonary Disease

## 2013-03-24 MED ORDER — AZITHROMYCIN 250 MG PO TABS: ORAL_TABLET | ORAL | Status: DC

## 2013-03-24 NOTE — Telephone Encounter (Signed)
Amanda aware of recs. RX CALLED IN. Nothing further needed

## 2013-03-24 NOTE — Telephone Encounter (Signed)
mucinex dm Twice daily  As needed  Cough/congestion  Fluids and rest  Was just on levaquin during hospital stay May send Zpack #1 tad , no rfills  Would wait if possible as this may be viral in nature .  Please contact office for sooner follow up if symptoms do not improve or worsen or seek emergency care

## 2013-03-24 NOTE — Telephone Encounter (Signed)
Spoke with daughter. She reports pt went to the grocery store yesterday and now she is coughing up yellow phlem, wheezing, chest tx x yesterday. No fever, no chills, no nausea, no vomiting, no body aches. They are requesting ABX. Pt saw TP on 03/21/13. Please advise TP thanks  Allergies  Allergen Reactions  . Codeine     REACTION: change in mental status  . Diphenhydramine Hcl     REACTION: itching  . Quinine     REACTION: abnormal sensations in face  . Sulfonamide Derivatives     REACTION: rash

## 2013-04-03 NOTE — Discharge Summary (Signed)
Physician Discharge Summary  Jocelyn Mendez [id number] DOB: 1930-11-10 DOA: 03/02/2013  PCP: Noralee Space, MD  Admit date: 03/02/2013 Discharge date: 03/09/2013  Time spent: 21minutes  Recommendations for Outpatient Follow-up:  1. PCP in 1 week  Discharge Diagnoses:    SIRS (systemic inflammatory response syndrome)   Fever   Diabetes mellitus type 2, insulin dependent   Acute on chronic renal failure   Acute respiratory failure with hypoxia   Influenza A   COPD exacerbation   HYPOTHYROIDISM   HYPERLIPIDEMIA   ANEMIA OF CHRONIC DISEASE   ANXIETY   HYPERTENSION   ASTHMATIC BRONCHITIS, ACUTE   RENAL INSUFFICIENCY      Discharge Condition: stable  Diet recommendation: diabetic  Filed Weights   03/07/13 0614 03/08/13 0700 03/09/13 0505  Weight: 80.922 kg (178 lb 6.4 oz) 80.786 kg (178 lb 1.6 oz) 80.74 kg (178 lb)    History of present illness:  80 year old female with a history of diabetes mellitus2, hyperlipidemia, hypertension, hypothyroidism presents with approximately three-day history of fever, generalized weakness, coughing, shortness of breath. The patient has been having fevers and chills at home without taking her temperature. She has had some coughing with yellow-green sputum   Hospital Course:  SIRS  Due to Influenza - Influenza PCR tested positive  - Started on Tamiflu and pt has completed therapy -in addition was also having COPD exacerbation/diffuse wheezing and slowly improving on steroids, was also briefly seen by Pulmonary. - Blood cultures x2 sets negative to date  - she was also treated with a 7day course of antibiotics - repeat CXR indicated improved aeration 01/26 with no new or active cardiopulmonary findings   Acute respiratory failure  - secondary to influenza  - -COPD exacerbation -as above, improved  Acute on chronic renal failure (CKD stage III)  - Baseline creatinine 1.6-1.7, Cr improved with gentle hydration  Hypertension  -  Continued on  amlodipine   Diabetes mellitus type 2  - Hemoglobin A1c 7.0 on 02/14/2013  - increased dose of NPH insulin as pt was on Solumedrol  - now transitioned to oral Prednisone and CBG better controlled   Lower extremity pain and edema  - Venous duplex negative for DVT     Discharge Exam: Filed Vitals:   03/09/13 0531  BP: 148/68  Pulse: 64  Temp: 97.4 F (36.3 C)  Resp: 18    General: AAOx3 Cardiovascular:S1S2/RRR Respiratory: CTAB  Discharge Instructions  Discharge Orders   Future Appointments Provider Department Dept Phone   05/18/2013 10:30 AM Noralee Space, MD [facility] [phone]   05/23/2013 10:45 AM Renato Shin, MD [facility] [phone]   Future Orders Complete By Expires   Diet Carb Modified  As directed    Increase activity slowly  As directed        Medication List    STOP taking these medications       Insulin Isophane Human 100 UNIT/ML Kiwkpen  Commonly known as:  HUMULIN N     levofloxacin 500 MG tablet  Commonly known as:  LEVAQUIN      TAKE these medications       albuterol 108 (90 BASE) MCG/ACT inhaler  Commonly known as:  PROVENTIL HFA;VENTOLIN HFA  Inhale 2 puffs into the lungs every 6 (six) hours as needed for wheezing or shortness of breath.     aspirin 81 MG tablet  Take 81 mg by mouth daily.     atorvastatin 20 MG tablet  Commonly known as:  LIPITOR  Take 20 mg by mouth at bedtime.     cyanocobalamin 1000 MCG/ML injection  Commonly known as:  (VITAMIN B-12)  Inject 1,000 mcg into the muscle every 30 (thirty) days.     fenofibrate 160 MG tablet  Take 160 mg by mouth daily.     ferrous sulfate 325 (65 FE) MG EC tablet  Take 325 mg by mouth 2 (two) times daily.     furosemide 20 MG tablet  Commonly known as:  LASIX  Take 1 tablet (20 mg total) by mouth daily.     glucose blood test strip  1 each by Other route 4 (four) times daily. Bayer contour test strips     Insulin Pen  Needle 31G X 8 MM Misc  1 each by Does not apply route. Use with insulin pen device, humulin     levothyroxine 150 MCG tablet  Commonly known as:  SYNTHROID, LEVOTHROID  Take 150 mcg by mouth daily before breakfast.     NEEDLE (DISP) 25 G 25G X 5/8" Misc  Commonly known as:  BD DISP NEEDLES  Use as directed to administer  the b12 injection monthly     Lancets 30G Misc  1 each by Does not apply route. Use to check blood sugars four times per day     onetouch ultrasoft lancets  Use as instructed     SIMBRINZA 1-0.2 % Susp  Generic drug:  Brinzolamide-Brimonidine  Place 1 drop into both eyes 2 (two) times daily.     Vitamin D3 2000 UNITS Tabs  Take 1 tablet by mouth 2 (two) times daily.       Allergies  Allergen Reactions  . Codeine     REACTION: change in mental status  . Diphenhydramine Hcl     REACTION: itching  . Quinine     REACTION: abnormal sensations in face  . Sulfonamide Derivatives     REACTION: rash       Follow-up Information   Follow up with NADEL,SCOTT M, MD. Schedule an appointment as soon as possible for a visit in 1 week.   Specialty:  Pulmonary Disease   Contact information:   [address] [place] [address] [phone]        The results of significant diagnostics from this hospitalization (including imaging, microbiology, ancillary and laboratory) are listed below for reference.    Significant Diagnostic Studies: Dg Chest 2 View  03/07/2013   CLINICAL DATA:  Cough and shortness of breath  EXAM: CHEST  2 VIEW  COMPARISON:  03/05/2013  FINDINGS: The cardiac shadow is stable. The lungs are clear bilaterally. No focal infiltrate or sizable effusion is noted.  IMPRESSION: No acute infiltrate or sizable effusion.   Electronically Signed   By: Inez Catalina M.D.   On: 03/07/2013 15:47   Dg Chest Port 1 View  03/05/2013   CLINICAL DATA:  Wheezing and shortness of breath  EXAM: PORTABLE CHEST - 1 VIEW  COMPARISON:  DG CHEST 2 VIEW dated 03/02/2013   FINDINGS: The heart size and mediastinal contours are within normal limits. Both lungs are clear. The visualized skeletal structures are unremarkable. Aeration is improved since the prior exam. Minimal residual left lower lobe presumed scarring is stable.  IMPRESSION: No active disease.  Improved aeration since prior exam.   Electronically Signed   By: Conchita Paris M.D.   On: 03/05/2013 14:21    Microbiology: No results found for this or any previous visit (from the past  240 hour(s)).   Labs: Basic Metabolic Panel: No results found for this basename: NA, K, CL, CO2, GLUCOSE, BUN, CREATININE, CALCIUM, MG, PHOS,  in the last 168 hours Liver Function Tests: No results found for this basename: AST, ALT, ALKPHOS, BILITOT, PROT, ALBUMIN,  in the last 168 hours No results found for this basename: LIPASE, AMYLASE,  in the last 168 hours No results found for this basename: AMMONIA,  in the last 168 hours CBC: No results found for this basename: WBC, NEUTROABS, HGB, HCT, MCV, PLT,  in the last 168 hours Cardiac Enzymes: No results found for this basename: CKTOTAL, CKMB, CKMBINDEX, TROPONINI,  in the last 168 hours BNP: BNP (last 3 results)  Recent Labs  03/05/13 0550  PROBNP 491.6*   CBG: No results found for this basename: GLUCAP,  in the last 168 hours     Signed:  Allesandra Huebsch  [facility] 04/03/2013, 7:56 PM

## 2013-04-04 ENCOUNTER — Other Ambulatory Visit: Payer: Self-pay | Admitting: Pulmonary Disease

## 2013-05-18 ENCOUNTER — Other Ambulatory Visit (INDEPENDENT_AMBULATORY_CARE_PROVIDER_SITE_OTHER): Payer: Medicare Other

## 2013-05-18 ENCOUNTER — Encounter: Payer: Self-pay | Admitting: Pulmonary Disease

## 2013-05-18 ENCOUNTER — Ambulatory Visit (INDEPENDENT_AMBULATORY_CARE_PROVIDER_SITE_OTHER): Payer: Medicare Other | Admitting: Pulmonary Disease

## 2013-05-18 VITALS — BP 130/62 | HR 70 | Temp 97.8°F | Ht 66.0 in | Wt 176.4 lb

## 2013-05-18 DIAGNOSIS — D638 Anemia in other chronic diseases classified elsewhere: Secondary | ICD-10-CM

## 2013-05-18 DIAGNOSIS — E119 Type 2 diabetes mellitus without complications: Secondary | ICD-10-CM

## 2013-05-18 DIAGNOSIS — E785 Hyperlipidemia, unspecified: Secondary | ICD-10-CM

## 2013-05-18 DIAGNOSIS — K219 Gastro-esophageal reflux disease without esophagitis: Secondary | ICD-10-CM

## 2013-05-18 DIAGNOSIS — E039 Hypothyroidism, unspecified: Secondary | ICD-10-CM

## 2013-05-18 DIAGNOSIS — M159 Polyosteoarthritis, unspecified: Secondary | ICD-10-CM

## 2013-05-18 DIAGNOSIS — M810 Age-related osteoporosis without current pathological fracture: Secondary | ICD-10-CM

## 2013-05-18 DIAGNOSIS — I10 Essential (primary) hypertension: Secondary | ICD-10-CM

## 2013-05-18 DIAGNOSIS — M545 Low back pain, unspecified: Secondary | ICD-10-CM

## 2013-05-18 DIAGNOSIS — J45909 Unspecified asthma, uncomplicated: Secondary | ICD-10-CM | POA: Insufficient documentation

## 2013-05-18 DIAGNOSIS — Z794 Long term (current) use of insulin: Secondary | ICD-10-CM

## 2013-05-18 DIAGNOSIS — E538 Deficiency of other specified B group vitamins: Secondary | ICD-10-CM

## 2013-05-18 DIAGNOSIS — J9600 Acute respiratory failure, unspecified whether with hypoxia or hypercapnia: Secondary | ICD-10-CM

## 2013-05-18 DIAGNOSIS — F411 Generalized anxiety disorder: Secondary | ICD-10-CM

## 2013-05-18 DIAGNOSIS — J9601 Acute respiratory failure with hypoxia: Secondary | ICD-10-CM

## 2013-05-18 DIAGNOSIS — K5730 Diverticulosis of large intestine without perforation or abscess without bleeding: Secondary | ICD-10-CM

## 2013-05-18 DIAGNOSIS — N259 Disorder resulting from impaired renal tubular function, unspecified: Secondary | ICD-10-CM

## 2013-05-18 DIAGNOSIS — R269 Unspecified abnormalities of gait and mobility: Secondary | ICD-10-CM

## 2013-05-18 LAB — CBC WITH DIFFERENTIAL/PLATELET
BASOS ABS: 0 10*3/uL (ref 0.0–0.1)
Basophils Relative: 0.4 % (ref 0.0–3.0)
Eosinophils Absolute: 0.6 10*3/uL (ref 0.0–0.7)
Eosinophils Relative: 11.1 % — ABNORMAL HIGH (ref 0.0–5.0)
HCT: 34.9 % — ABNORMAL LOW (ref 36.0–46.0)
Hemoglobin: 11.8 g/dL — ABNORMAL LOW (ref 12.0–15.0)
LYMPHS ABS: 1.3 10*3/uL (ref 0.7–4.0)
Lymphocytes Relative: 23.7 % (ref 12.0–46.0)
MCHC: 33.8 g/dL (ref 30.0–36.0)
MCV: 94.2 fl (ref 78.0–100.0)
MONO ABS: 0.8 10*3/uL (ref 0.1–1.0)
MONOS PCT: 14.4 % — AB (ref 3.0–12.0)
NEUTROS PCT: 50.4 % (ref 43.0–77.0)
Neutro Abs: 2.7 10*3/uL (ref 1.4–7.7)
PLATELETS: 214 10*3/uL (ref 150.0–400.0)
RBC: 3.71 Mil/uL — ABNORMAL LOW (ref 3.87–5.11)
RDW: 13.4 % (ref 11.5–14.6)
WBC: 5.4 10*3/uL (ref 4.5–10.5)

## 2013-05-18 LAB — IBC PANEL
IRON: 89 ug/dL (ref 42–145)
Saturation Ratios: 23.7 % (ref 20.0–50.0)
Transferrin: 268.4 mg/dL (ref 212.0–360.0)

## 2013-05-18 LAB — BASIC METABOLIC PANEL
BUN: 21 mg/dL (ref 6–23)
CHLORIDE: 109 meq/L (ref 96–112)
CO2: 24 mEq/L (ref 19–32)
Calcium: 10.1 mg/dL (ref 8.4–10.5)
Creatinine, Ser: 1.5 mg/dL — ABNORMAL HIGH (ref 0.4–1.2)
GFR: 34.93 mL/min — ABNORMAL LOW (ref >60.00)
GLUCOSE: 136 mg/dL — AB (ref 70–99)
POTASSIUM: 4.1 meq/L (ref 3.5–5.1)
SODIUM: 141 meq/L (ref 135–145)

## 2013-05-18 LAB — HEPATIC FUNCTION PANEL
ALBUMIN: 4 g/dL (ref 3.5–5.2)
ALT: 23 U/L (ref 0–35)
AST: 30 U/L (ref 0–37)
Alkaline Phosphatase: 50 U/L (ref 39–117)
Bilirubin, Direct: 0.1 mg/dL (ref 0.0–0.3)
Total Bilirubin: 0.5 mg/dL (ref 0.3–1.2)
Total Protein: 7.3 g/dL (ref 6.0–8.3)

## 2013-05-18 LAB — TSH: TSH: 4.56 u[IU]/mL (ref 0.35–5.50)

## 2013-05-18 LAB — HEMOGLOBIN A1C: Hgb A1c MFr Bld: 6.1 % (ref 4.6–6.5)

## 2013-05-18 MED ORDER — CYANOCOBALAMIN 1000 MCG/ML IJ SOLN
1000.0000 ug | Freq: Once | INTRAMUSCULAR | Status: AC
  Administered 2013-05-18: 1000 ug via INTRAMUSCULAR

## 2013-05-18 MED ORDER — ALBUTEROL SULFATE HFA 108 (90 BASE) MCG/ACT IN AERS: 2.0000 | INHALATION_SPRAY | Freq: Four times a day (QID) | RESPIRATORY_TRACT | Status: DC | PRN

## 2013-05-18 MED ORDER — AMLODIPINE BESYLATE 5 MG PO TABS: ORAL_TABLET | ORAL | Status: DC

## 2013-05-18 MED ORDER — FUROSEMIDE 20 MG PO TABS: 20.0000 mg | ORAL_TABLET | Freq: Every day | ORAL | Status: DC

## 2013-05-18 MED ORDER — ATORVASTATIN CALCIUM 20 MG PO TABS: ORAL_TABLET | ORAL | Status: DC

## 2013-05-18 NOTE — Progress Notes (Signed)
Subjective:    Patient ID: Jocelyn Mendez, female    DOB: 04/14/1930, 80 y.o.   MRN: [id number]  HPI 80 y/o WF here for a follow up visit... she has mult med problems including:  HBP, Hypercholesterolemia, DM, mild renal insuffic, & multifactorial anemia w/ B12 defic on shots monthly... she also has DJD, LBP, Osteopenia, etc...  << see prev notes for earlier entries >>  ~  September 15, 2011:  6wk [place] is doing reasonably well overall> she is requesting a new DM meter & we wrote perscription for this to get at her Pharm;  On Humulin N 100u daily & she notes BS "all over the place" & blames her meter- we reviewed diet, exercise, proper insulin injection technique etc;  BP controlled on Norvasc & doesn't use the Lasix except maybe once or twice per month;  Due for f/u labs today & tells me she's taking everything regularly (including the Levothy125);  She has some mod CWP & tender- discussed rest, heat, offered Tramadol but she prefers to stick w/ Tylenol alone...    We reviewed prob list, meds, xrays and labs> see below for updates >>  CXR 6/13 showed norm heart size, min streaky atx at basesw/ sl peribronchial thickening c/w bronchitis, otherw clear, NAD...  LABS 8/13:  FLP- chol ok but TG=232 HDL=30 on Lip20+Feno160;  Chems- BS=89 A1c=8.8 on NPH100u/d per DrEllison, & BUN=34 Creat=1.7 (stable);  TSH=20 on Levothy125...  ~  October 16, 2011:  53mo ROV & urgent add-on appt for infected cyst on her back> daugh called today w/ raised red tender knot on upper right back; exam shows a mod size sebaceous cyst, tender to palpation w/ sm amt beige pus draining; no other lesion apparent, no f/c/s, VS are stable & we discussed Rx w/ warm compresses, apply neosporin, clean gauze, etc; start Rx w/ Duricef 500mg  Bid x10d & take Align...  Reminded to drink plenty of water, stay well hydrated & check BS regularly;  She requests 2013 Flu vaccine today- ok...  ~  January 19, 2012:  94mo ROV & prev infected cyst  has resolved; here for f/u visit & she reports doing fine- no new complaints or concerns...  We reviewed the following medical problems during today's office visit>>     HxAB> she gets freq exac & requires antibiotics, etc...    HBP> on Amlod5, Lasix20mg  prn swelling; BP= 142/70 & she denies CP, palpit, ch in SOB, edema, etc...    HxCP> on ASA81 & she needs to be more active, incr exercise program...    CHOL> on Lip20 & Feno160; last FLP 8/13 showed TChol 159, TG 232, HDL 30, LDL 99    DM> on NovolinN per DrEllison- currently 110u Qam; last labs 8/13 showed BS=89 & A1c=8.8    Hypothy> on Synthroid150; 8/13 TSH=20.2; reminded to take it everyday first thing in the AM on empty stomach...    GI- GERD, Divertics> on Zantac150; she denies abd pain, dysphagia, n/v, d/c, blood seen...    Renal Insiuffic> she has seen [place] for Nephrology; stable off nephrotoxic meds; Creat~1.6-1.7 range...    DJD, LBP, Osteopenia> on calcium, MVI, Vit D supplement; uses Tylenol prn pain; notes discomfort in hands etc...    Anxiety> on Alpraz0.25 as needed...    Anemia, B12 Defic> on Fe325mg /d & B12 injection monthly We reviewed prob list, meds, xrays and labs> see below for updates >>   ~  May 17, 2012:  55mo ROV & Jocelyn Mendez reports  a good interval- feels well w/o new complaints or concerns... We reviewed the following medical problems during today's office visit >>     HxAB> she reports breathing is good w/o recent resp exac & denies much cough, sput, SOB, etc...    HBP on Amlod5, Lasix20mg  prn swelling; BP= 120/66 & she denies CP, palpit, ch in SOB, edema, etc...    Chol controlled on Lip20 & Feno160; FLP today showed TChol 163, TG 240, HDL 27, LDL 101; we discussed better low fat diet, incr exercise & wt reduction...    She saw DrEllison earlier today for f/u DM w/ RI & PN; on NPH insulin taking 110u daily & labs today showed BS= 148, A1c= 9.7 (it was 8.8 in [place]) and DrEllison is in charge of her DM meds...     She remains on Synthroid150; reminded to take it everyday first thing in the AM on empty stomach; TSH = 16.91 and it seems unlikely that 161mcg isn't enough for her, reminded to take it every day & bring all med bottles to every office visit...    She saw Nephrology last 10/13 [facility]) w/ Stage3 CKD; Creat stable ~1.7 & calcGFR ~30; no further suggestions & disch from her clinic; labs today showed Cr= 1.7, stable...    Anxiety> on Alpraz0.25 prn...    She has Anemia of chr dis, Hx low Fe, & B12 defic> w/ Hg in the 10-12 range on Fe-bid & B12 shots monthly; Labs 4/14 showed Hg=11.8, continue same... We reviewed prob list, meds, xrays and labs> see below for updates >>   LABS 4/14:  FLP- Chol ok but TG=240, HDL=27;  Chems- ok x BS=148, Aic=9.7, Cr=1.7;  CBC w/ Hg=11.8;  TSH=16.91;  VitD=34   ~  November 16, 2012:  17mo ROV & Jocelyn Mendez reports stable overall- we reviewed the following medical problems during today's office visit >>     HxAB> she gets freq exac & requires antibiotics etc, but not on regular meds...    HBP> on Amlod5, Lasix2; BP= 138/60 & she denies CP, palpit, ch in SOB, edema, etc...    HxCP> on ASA81 & she needs to be more active, incr exercise program...    CHOL> on Lip20 & Feno160; FLP 10/14 showed TChol 171, TG 245, HDL 31, LDL 102... Rec to continue same + better low fat diet.    DM> on NovolinN per DrEllison- currently 120u Qam & 10upm; Labs 10/14 showed BS=124 & A1c=8.0    Hypothy> on Synthroid150; Labs 10/14 showed TSH=18.39 & DrEllison is aware, pt clinically euthyroid; reminded to take it everyday first thing in the AM on empty stomach.    GI- GERD, Divertics> on Zantac150; she denies abd pain, dysphagia, n/v, d/c, blood seen...    Renal Insiuffic> she has seen [place] for Nephrology; stable off nephrotoxic meds; Creat~1.6-1.7 range & stable...    DJD, LBP, Osteopenia> on calcium, MVI, Vit D supplement; uses Tylenol prn pain; notes discomfort in hands etc...     Anxiety> prev on Alpraz0.25 as needed...    Anemia, B12 Defic> on Fe325mg /d & B12 injection monthly; labs reviewed in Epic. We reviewed prob list, meds, xrays and labs> see below for updates >> She received the 2014 Flu vaccine today & a B12 shot...  LABS 101/14:  FLPo on Lip20+Feno160 but TG=245 & needs better low fat diet;  Chems- ok x BS=124 A1c=8.0 Creat=1.6;  CBC- ok w/ Hg=12.1;  TSH=18.39 on 118mcg/d & reminded to take in AM on empty stomach  every day!  ~  May 18, 2013:  10mo ROV & post hosp visit> Jocelyn Mendez was hosp 1/21 - 03/09/13 by [facility] w/ an ILI, COPD exac, and resp failure; treated w/ Tamiflu, IV antibiotics=> Levaquin and Pred; states that shes "doing good now" & daugh is happy w/ her progress... She will be moving to [place] and [place] to be close to one of her daughters...     BP remains controlled on Amlod5 and prn Lasix20 (hasn't needed she says- not swelling); BP= 130/62 today & she denies CP, palpit, ch in SOB, edema, etc...    Lipids have been fair on combination of Lip20 & Feno160> last FLP 10/14 showed TChol 171, TG 245, HDL 31, LDL 102; we reviewed low fat diet...     She continues to f/u w/ DrEllison for Endocrine/DM> on Humulin N 145u daily at present; last visit was 1/15 and note reviewed; labs showed BS= 200-500 during the hosp and A1c= 7-8 range; labs today showed BS=136, A1c=6.1.Marland KitchenMarland Kitchen     GI is stable on Zantac150 & she denies GI symptoms, doing well...    She saw Nephrology, DrGoldsborough in the past w/ Stage3 CKD, believed to be related to DM & HBP; Creat had been stable in the 1.5 range, bumped up to 2.4 during the 1/15 hosp & back to 1.5 now...    Similarly she was anemic w/ Hg~10 and now improved to 11.8 w/ Fe=89 (24%) We reviewed prob list, meds, xrays and labs> see below for updates >> OK B12 shot today...  CXR 1/15 showed clear lungs, norm heart size, NAD...  PFT 4/15 showed FVC=1.97 (68%), FEV1=1.49 (71%), %1sec=76, mid-flows=78%predicted... c/w  mild-mod restrictive disease...  LABS 4/15:  Chems- ok x BS=136, A1c=6.1, Cr=1.5;  CBC- ok w/ Hg=11.8;  Fe=89 (24%sat);  TSH=4.56           Problem List:  Hx of ASTHMATIC BRONCHITIS, ACUTE (ICD-466.0) - no recent bronchitic episodes... the increased activity has really helped her... ~  CXR 5/12 showed clear lungs x mild scarring left base & apical pleural thickening... ~  CXR 6/13 showed norm heart size, min streaky atx at basesw/ sl peribronchial thickening c/w bronchitis, otherw clear, NAD... ~  [facility] 1/15 by [facility] w/ ILI, "COPD exac" and resp failure; CXR was clear, she responded to antibiotics and Pred course=> back to baseline... ~  PFT 4/15 showed FVC=1.97 (68%), FEV1=1.49 (71%), %1sec=76, mid-flows=78%predicted... c/w mild-mod restrictive disease  HYPERTENSION (ICD-401.9) - prev on Diovan/HCT but this was stopped 4/12 Hosp & now on NORVASC 5mg /d... ~  10/12:  BP= 128/62 & she denies CP, palpit, syncope, SOB, edema... ~  2/13:  BP= 130/70 & she feels well & denies symptoms... ~  4/13:  DrGoldsborough started LASIX 20mg /d as needed for swelling... ~  6/13:  BP= 120/60 & she notes some CP from her cough, but no palpit/ ch in SOB, etc... ~  8/13:  BP= 130/60 & although she c/o left CWP, she denies palpit, ch in SOB, recent edema, etc... ~  12/13: on Amlod5, Lasix20mg  prn swelling; BP= 142/70 & she denies CP, palpit, ch in SOB, edema, etc... ~  4/14:  on Amlod5, Lasix20mg  prn swelling; BP= 120/66 & she denies CP, palpit, ch in SOB, edema, etc... ~  10/14: on Amlod5, Lasix20; BP= 138/60 & she denies CP, palpit, ch in SOB, edema, etc... ~  4/15: on Amlod5 and prn Lasix20 (hasn't needed she says- not swelling); BP= 130/62 today & she denies CP, palpit, ch  in SOB, edema, etc.  Hx of CHEST PAIN (ICD-786.50) - on ASA 81mg /d;  she continues to experience intermittent sharp left CWP- Rx w/ Tylenol Prn. ~  cath 1988 showed normal coronaries, min MVP... ~  Cards eval 4/12 by DrNishan> mult risk  factors, atypCP, EKG w/ NSSTTWA, planned Myoview but not done due to subseq hosp 4/12... ~  8/13:  She is c/o left CWP- sharp, tender in left axillary area, etc; discussed rest, heat, Tylenol (she does not want stronger pain med).  DM (ICD-250.00) - followed by DrEllison> DM w/ Nephropathy & Neuropathy> on HUMULIN N 100u daily; (off Metformin rx since 4/12 hosp)... ~  labs 8/08 showed BS= 113, HgA1c=5.4.Marland KitchenMarland Kitchen On Metform + Glimep. ~  labs 4/09 showed BS= 99, HgA1c= 5.5.Marland KitchenMarland Kitchen Stopped Glimep. ~  labs 4/10 showed BS= 125, A1c= 7.1.Marland Kitchen. rec> better diet, same meds for now. ~  labs 4/11 showed BS= 266, A1c= 9.1.Marland KitchenMarland Kitchen what happened? Add Glimep1mg /d back. ~  labs 6/11 showed BS= 195, A1c= 7.9.Marland KitchenMarland Kitchen incr Glimep to 2mg /d. ~  labs 10/11 showed BS= 175, A1c= 7.9.Marland KitchenMarland Kitchen rec incr Gimep to 4mg /d. ~  Labs in [facility] 5/12 showed BS= 232==>139; A1c=9.7; Metform stopped in hosp, on [place]. ~  Labs 5/12 post hosp showed BS= 255, A1c= 8.6.Marland KitchenMarland Kitchen Referred to Endocrinology. ~  7/12:  BS at home all in the 200s, she doesn't want additional meds, wants to wait for appt w/ DrKerr 7/24. ~  8/12:  She was seen by DrKerr & her started her on Levemir insulin + Onglyza 2.5mg /d ==> his notes & labs are reviewed... ~  10/12: Note from DrKerr> pt on Levemir 80u + Onglyza 2.5mg /d w/ A1c= 9.0.Marland Kitchen. ~  Labs here 2/13 showed FBS= 229, A1c= 11.0 & we will fax to DrKerr for his review... ~  Pt switched to DrEllison in the interval> meds changed to HUMULIN N & now on 100u daily w/ recent A1c=8.9.Marland Kitchen. ~  Labs 8/13 on HumulinN100/d showed BS= 89, A1c= 8.8... We reviewed diet, exercise, & f/u w/ DrEllison. ~  4/14: on NPH insulin taking 110u daily & labs today showed BS= 148, A1c= 9.7 (it was 8.8 in [place]) and DrEllison is in charge of her DM meds. ~  10/14: on NovolinN per DrEllison- currently 120u Qam & 10upm; Labs 10/14 showed BS=124 & A1c=8.0.Marland KitchenMarland Kitchen Med adjustments per DrEllison. ~  4/15: She continues to f/u w/ DrEllison for Endocrine/DM> on Humulin N 145u  daily at present; last visit was 1/15 and note reviewed; labs showed BS= 200-500 during the hosp and A1c= 7-8 range; labs today showed BS=136, A1c=6.1  HYPERLIPIDEMIA (ICD-272.4) - on LIPITOR 20mg /d & FENOFIBRATE 160mg /d (ch from Lopid 10/10). ~  FLP 8/08 showed TChol 129, TG 197, HDL 17, LDL 73... despite taking statin & fibrate drugs. ~  FLP 4/09 on Lip20+Tric145 showed TChol 127, TG 269, HDL 13, LDL 69... rec> ch Tricor to [phone]. ~  [place] 10/09 on Lip20+Trilip135 showed TChol 124, TG 247, HDL 10!, LDL 64... rec> ch Trilip to LopidBid. ~  FLP 4/10 on Lip20+LopidBid showed TChol 174, TG 239, HDL 38, LDL 103... rec> ch Lopid to Feno160 (she never did). ~  FLP 10/10 showed TChol 134, TG 256, HDL 10, LDL 76... change the Lopid to Fenofib160/d. ~  FLP 4/11 on Lip20+Feno160 showed TChol 163, TG 234, HDL 28, LDL 108... keep same, better diet! ~  FLP 10/11 on Lip20+Feno160 showed TChol 172, Tg 304, HDL 26, LDL 112... take meds regularly & better diet. ~  FLP 4/12 on Lip20+Feno160 showed TChol 160, TG 258, HDL 19, LDL 100... Needs better low fat diet. ~  FLP 2/13 on Lip20+Feno160 showed TChol 147, TG 165, HDL 30, LDL 85 ~  FLP 8/13 on Lip20+Feno160 showed TChol 159, TG 232, HDL 30, LDL 99 ~  FLP 4/14 on Lip20+Feno160 showed TChol 163, TG 240, HDL 27, LDL 101 ~  FLP 10/14 on Lip20+Feno160 showed TChol 171, TG 245, HDL 31, LDL 102... Rec to continue same + better low fat diet.   HYPOTHYROIDISM (ICD-244.9) - Hx remote Thyroid surg & scar in neck> on SYNTHROID replacement. ~  last TSH 1/08 = 0.41 ~  labs 4/09 showed TSH= 2.35 ~  labs 10/09 on Levothy125 showed TSH= 0.97 ~  labs 4/10 on Levothy125 showed TSH= 0.08... rec> decr [phone]. ~  labs 10/10 on Levothy112 showed TSH= 2.18... keep same. ~  labs 4/11 on Levo112 showed TSH= 0.51 ~  Labs 4/12 on Levo112 showed TSH= 1.02 ~  Labs 2/13 on Levo112 showed TSH= 20.8.Marland KitchenMarland Kitchen Why? Pharm confirms regular refills, we will incr dose to 120mcg/d. ~  Labs 8/13  on Levo125 showed TSH= 20.2.Marland KitchenMarland Kitchen We decided to incr LEVOTHYROID 1103mcg/d... ~  Labs 4/14 on Levo150 showed TSH= 16.91... This dose should be adeq & rec to take 1st thing in AM on empty stomach etc... ~  Labs 10/14 on Levothy150 showed TSH= 18.39... Rec to take in AM on empty stomach & every day... ~  Labs 4/15 on Levothy150 showed TSH= 4.56  GERD (ICD-530.81) - takes NEXIUM 40mg  Prn (states Omep20 "makes me sick")...  ~  EGD was planned in 2006 eval but never done... ~  6/13:  She want to switch to ZANTAC 150mg /d since Nexium is too expensive  DIVERTICULOSIS OF COLON (ICD-562.10) - c/o constip & rec> Miralax + Senakot-S... ~  last colonoscopy 3/06 by DrDBrodie showed divertics only...  RENAL INSUFFICIENCY (ICD-588.9) - prev Creat in the 1.7 - 2.0 range... ~  labs 8/08 showed BUN=26, Creat=1.5 ~  labs 4/09 showed BUN= 34, Creat= 1.5 ~  labs 10/09 showed BUN= 31, Creat= 1.5 ~  labs 4/10 showed BUN= 28, Creat= 1.0, Microalb= pos... continue Rx (on Diovan). ~  labs 10/10 showed BUN= 22, Creat= 1.2 ~  labs 4/11 showed BUN= 20, Creat= 1.5 ~  labs 6/11 showed BUN= 20, Creat= 1.3 ~  labs 10/11 showed BUN= 27, Creat= 1.5 ~  Renal Ultrasound 5/12 was neg- NAD, no hydronephrosis... ~  Labs in [facility] 5/12 showed Creat= 2.11==>1.4 ~  Labs 5/12 post hosp showed BUN=44, Creat=1.7, K=5.7, HCO3=23;  She has appt w/ Nephrology set up by the Hospitalists. ~  6/12 & 10/12:  Pt seen by DrGoldsborough & her notes are reviewed... ~  Labs 2/13 showed BUN= 33, Creat= 1.6 ~  Labs 8/13 showed BUN= 34, Creat= 1.7 ~  10/13: she had f/u DrGoldborough> Cr stable at 1.7, due to HBP + DM, no other rev causes found, released from her clinic... ~  Labs 4/14 showed BUN= 27, Creat= 1.7 ~  Labs 10/14 showed BUN= 26, Cr= 1.6 ~  [facility] 1/15 w/ ILI and resp failure> Cr rose to 2.4 & back to 1.7 by disch... ~  Labs 4/15 showed BUN= 21, Cr= 1.5  DEGENERATIVE JOINT DISEASE, GENERALIZED (ICD-715.00) - she uses Tylenol Prn. LOW BACK  PAIN (ICD-724.2) OSTEOPOROSIS (ICD-733.00) - she takes calcium + vits daily... ~  BMD here 2/06 showed TScores -1.4 Spine,  -1.1 left FemNeck... ~  labs 4/09 showed Vit  D level= 24... rec OTC Vit D 1000 u daily, but she stopped this. ~  labs 4/10 showed Vit D level = 20... rec> incr OTC Vit D 2000 u daily (she never did). ~  labs 4/11 showed Vit D level = 22...Marland KitchenMarland KitchenMarland Kitchen rec> incr to 2000 u daily. ~  Labs 4/12 showed Vit D level = 32 ~  Labs 4/14 showed Vit D level = 34, rec to continue supplements...  HEADACHE (ICD-784.0)  ANXIETY (ICD-300.00) - she notes her Bro has emphysema, lung cancer and CHF... she takes ALPRAZOLAM 0.5mg  Prn.  ANEMIA>  Hx ACD & IronDefic>  Rx FeSO4 + VitC ~  Hx of multifactorial anemia w/ Hg=11 in 2005, Fe was OK, Stools neg for blood, Folate norm, B12 found low, & Creat 1.5+ ~  labs 1/08 showed Hg=11.1 ~  labs 8/08 showed Hg=9.9 ~  labs 4/09 showed Hg= 9.6, Fe=102, TIBC=344, Sat=21% ~  labs 10/09 showed Hg= 9.1, MCV= 92 ~  labs 4/10 showed = 11.1, Fe= 71 ~  labs 10/10 showed Hg= 9.3, MCV= 93... rec Fe Bid w/ Vit C... ~  labs 4/11 showed Hg= 11.8, MCV=87, Fe=86 ~  labs 10/11 showed Hg= 10.9, MCV= 89 ~  Labs in [facility] 5/12 showed Hg= 10.3==>8.6, Fe=19 (5%sat), B12=505, Folate=16, SPE= neg. ~  Labs 2/13 showed Hg= 12.0 ~  Labs 4/14 showed Hg= 11.8 ~  Labs 10/14 showed Hg= 12.1 ~  Labs showed Hg ~10 during 1/15 hosp... ~  Labs 4/15 showed Hg= 11.8  VITAMIN B12 DEFICIENCY (ICD-266.2) - Vit B12 level was 77 in 2006 w/ pos parietal cell antibodies; she's been on B12 shots ever since then...  ~  f/u B12 level 4/10 = >1500 ~  f/u B12 level 5/12= 505 ~  She remains on monthly Vit B12 shots...   Past Surgical History  Procedure Laterality Date  . Appendectomy      Outpatient Encounter Prescriptions as of 05/18/2013  Medication Sig  . albuterol (PROVENTIL HFA;VENTOLIN HFA) 108 (90 BASE) MCG/ACT inhaler Inhale 2 puffs into the lungs every 6 (six) hours as needed for  wheezing or shortness of breath.  Marland Kitchen amLODipine (NORVASC) 5 MG tablet TAKE 1 TABLET (5 MG TOTAL) BY MOUTH DAILY.  Marland Kitchen aspirin 81 MG tablet Take 81 mg by mouth daily.    Marland Kitchen atorvastatin (LIPITOR) 20 MG tablet TAKE 1 TABLET BY MOUTH EVERY DAY  . BAYER CONTOUR TEST test strip 1 EACH BY OTHER ROUTE 4 (FOUR) TIMES DAILY AS NEEDED FOR OTHER. AND LANCETS 2/DAT 250.03  . BD PEN NEEDLE NANO U/F 32G X 4 MM MISC USE AS DIRECTED WITH THE HUMULIN PEN INSULIN  . Brinzolamide-Brimonidine (SIMBRINZA) 1-0.2 % SUSP Place 1 drop into both eyes 2 (two) times daily.  . Cholecalciferol (VITAMIN D3) 2000 UNITS TABS Take 1 tablet by mouth 2 (two) times daily.  . cyanocobalamin (,VITAMIN B-12,) 1000 MCG/ML injection Inject 1,000 mcg into the muscle every 30 (thirty) days.  . fenofibrate 160 MG tablet Take 160 mg by mouth daily.  . ferrous sulfate 325 (65 FE) MG EC tablet Take 325 mg by mouth 2 (two) times daily.   . furosemide (LASIX) 20 MG tablet Take 1 tablet (20 mg total) by mouth daily.  Marland Kitchen glucose blood test strip 1 each by Other route 4 (four) times daily. Bayer contour test strips  . Insulin Isophane Human (HUMULIN N) 100 UNIT/ML Kiwkpen Inject 145 Units into the skin every morning.  . Insulin Pen Needle 31G X 8 MM  MISC 1 each by Does not apply route. Use with insulin pen device, humulin  . Lancets (ONETOUCH ULTRASOFT) lancets Use as instructed  . Lancets 30G MISC 1 each by Does not apply route. Use to check blood sugars four times per day  . levothyroxine (SYNTHROID, LEVOTHROID) 150 MCG tablet Take 150 mcg by mouth daily before breakfast.  . NEEDLE, DISP, 25 G (BD DISP NEEDLES) 25G X 5/8" MISC Use as directed to administer  the b12 injection monthly  . ranitidine (ZANTAC) 150 MG capsule Take 1 capsule by mouth at bedtime.  Marland Kitchen atorvastatin (LIPITOR) 20 MG tablet TAKE 1 TABLET BY MOUTH EVERY DAY  . azithromycin (ZITHROMAX) 250 MG tablet Take as directed  . [DISCONTINUED] amLODipine (NORVASC) 5 MG tablet TAKE 1 TABLET  (5 MG TOTAL) BY MOUTH DAILY.  . [DISCONTINUED] atorvastatin (LIPITOR) 20 MG tablet Take 20 mg by mouth at bedtime.    Allergies  Allergen Reactions  . Codeine     REACTION: change in mental status  . Diphenhydramine Hcl     REACTION: itching  . Quinine     REACTION: abnormal sensations in face  . Sulfonamide Derivatives     REACTION: rash    Current Medications, Allergies, Past Medical History, Past Surgical History, Family History, and Social History were reviewed in [employer] record.    Review of Systems        See HPI - all other systems neg except as noted... The patient complains of nausea & diarrhea- now resolved.  The patient denies anorexia, fever, weight loss, weight gain, vision loss, decreased hearing, hoarseness, syncope, peripheral edema, prolonged cough, headaches, hemoptysis, abdominal pain, melena, hematochezia, severe indigestion/heartburn, hematuria, incontinence, suspicious skin lesions, transient blindness, depression, unusual weight change, abnormal bleeding, enlarged lymph nodes, and angioedema.     Objective:   Physical Exam      WD, WN, 80 y/o WF in NAD...  VS reviewed & BP= 120/64 w/o postural changes... GENERAL:  Alert & oriented; pleasant & cooperative... HEENT:  [place]/AT, EOM-wnl, PERRLA, EACs-clear, TMs- sl red on left, NOSE-clear, THROAT-clear & wnl. NECK:  Decr ROM; no JVD; normal carotid impulses w/o bruits; +thyroid scar, no thyromegaly or nodules palpated; no lymphadenopathy. CHEST:  Clear to P & A; without wheezes/ rales/ or rhonchi heard; +tender chest wall & pectoralis area. HEART:  Regular Rhythm; without murmurs/ rubs/ or gallops detected... ABDOMEN:  Soft & nontender; normal bowel sounds; no organomegaly or masses palpated... EXT: without deformities, mild arthritic changes; no varicose veins/ +venous insuffic/ no edema. NEURO:  CN's intact; no focal neuro deficits or weakness found... DERM:  no lesions  noted...  RADIOLOGY DATA:  Reviewed in the EPIC EMR & discussed w/ the patient...   LABORATORY DATA:  Reviewed in the EPIC EMR & discussed w/ the patient...   Assessment & Plan:    AB, pulm restriction by PFT>  No recent infectious exac; she knows to get yearly flu vaccine etc...  Hypertension>  Her BP remains controlled on Amlodipine monotherapy (+Lasix20 prn), & specifically her BP is not too low & there are no postural changes;  Continue same med...  Hx of CP>  She was seen 05/13/10 for a routine 79mo check up & daugh mentioned her atypCP & wanted cardiac eval & seen by DrNishan; EKG w/ poor R progression & NSSTTWA....  Diabetes>  Now followed & managed by DrEllison on HUMULIN N at 145u daily; A1c is 6.1 currently & she has f/u soon...  Hyperlipidemia>  On Lipitor & Fenofibrate, needs better low fat diet, continue same meds...  Renal Insuffic>  Prev followed by DrGoldsborough & Creat is stable at 1.5.Marland KitchenMarland Kitchen  Hypothyroid>  TSH suddenly incr to ~20 on 186mcg/d, the dose grad incr to 15mcg/d; TSH now 4.56 & clinically stable, encouraged to take the med daily...  Anemia>  Multifactorial, on B12 shots monthly & Fe/ VitC supplementation...  Anxiety>  She stopped her prn Alprazolam...  Infected sebaceous cyst on back> resolved...   Patient's Medications  New Prescriptions   No medications on file  Previous Medications   ASPIRIN 81 MG TABLET    Take 81 mg by mouth daily.     AZITHROMYCIN (ZITHROMAX) 250 MG TABLET    Take as directed   BAYER CONTOUR TEST TEST STRIP    1 EACH BY OTHER ROUTE 4 (FOUR) TIMES DAILY AS NEEDED FOR OTHER. AND LANCETS 2/DAT 250.03   BD PEN NEEDLE NANO U/F 32G X 4 MM MISC    USE AS DIRECTED WITH THE HUMULIN PEN INSULIN   BRINZOLAMIDE-BRIMONIDINE (SIMBRINZA) 1-0.2 % SUSP    Place 1 drop into both eyes 2 (two) times daily.   CHOLECALCIFEROL (VITAMIN D3) 2000 UNITS TABS    Take 1 tablet by mouth 2 (two) times daily.   CYANOCOBALAMIN (,VITAMIN B-12,) 1000 MCG/ML  INJECTION    Inject 1,000 mcg into the muscle every 30 (thirty) days.   FENOFIBRATE 160 MG TABLET    Take 160 mg by mouth daily.   FERROUS SULFATE 325 (65 FE) MG EC TABLET    Take 325 mg by mouth 2 (two) times daily.    GLUCOSE BLOOD TEST STRIP    1 each by Other route 4 (four) times daily. Bayer contour test strips   INSULIN ISOPHANE HUMAN (HUMULIN N) 100 UNIT/ML KIWKPEN    Inject 145 Units into the skin every morning.   INSULIN PEN NEEDLE 31G X 8 MM MISC    1 each by Does not apply route. Use with insulin pen device, humulin   LANCETS (ONETOUCH ULTRASOFT) LANCETS    Use as instructed   LANCETS 30G MISC    1 each by Does not apply route. Use to check blood sugars four times per day   LEVOTHYROXINE (SYNTHROID, LEVOTHROID) 150 MCG TABLET    Take 150 mcg by mouth daily before breakfast.   NEEDLE, DISP, 25 G (BD DISP NEEDLES) 25G X 5/8" MISC    Use as directed to administer  the b12 injection monthly   RANITIDINE (ZANTAC) 150 MG CAPSULE    Take 1 capsule by mouth at bedtime.  Modified Medications   Modified Medication Previous Medication   ALBUTEROL (PROVENTIL HFA;VENTOLIN HFA) 108 (90 BASE) MCG/ACT INHALER albuterol (PROVENTIL HFA;VENTOLIN HFA) 108 (90 BASE) MCG/ACT inhaler      Inhale 2 puffs into the lungs every 6 (six) hours as needed for wheezing or shortness of breath.    Inhale 2 puffs into the lungs every 6 (six) hours as needed for wheezing or shortness of breath.   AMLODIPINE (NORVASC) 5 MG TABLET amLODipine (NORVASC) 5 MG tablet      TAKE 1 TABLET (5 MG TOTAL) BY MOUTH DAILY.    TAKE 1 TABLET (5 MG TOTAL) BY MOUTH DAILY.   ATORVASTATIN (LIPITOR) 20 MG TABLET atorvastatin (LIPITOR) 20 MG tablet      TAKE 1 TABLET BY MOUTH EVERY DAY    TAKE 1 TABLET BY MOUTH EVERY DAY   FUROSEMIDE (LASIX) 20 MG TABLET furosemide (LASIX) 20 MG  tablet      Take 1 tablet (20 mg total) by mouth daily.    Take 1 tablet (20 mg total) by mouth daily.  Discontinued Medications   AMLODIPINE (NORVASC) 5 MG TABLET     TAKE 1 TABLET (5 MG TOTAL) BY MOUTH DAILY.   ATORVASTATIN (LIPITOR) 20 MG TABLET    Take 20 mg by mouth at bedtime.   ATORVASTATIN (LIPITOR) 20 MG TABLET    TAKE 1 TABLET BY MOUTH EVERY DAY

## 2013-05-18 NOTE — Patient Instructions (Signed)
Today we updated your med list in our EPIC system...    Continue your current medications the same...    We refilled your meds per request...  Today we did your follow up blood work...    We will contact you w/ the results when available...   Good luck w/ the move...  Call for any questions or if we can be of service in any way.Marland KitchenMarland Kitchen

## 2013-05-19 ENCOUNTER — Encounter: Payer: Self-pay | Admitting: Pulmonary Disease

## 2013-05-23 ENCOUNTER — Ambulatory Visit: Payer: Medicare Other | Admitting: Endocrinology

## 2013-06-13 ENCOUNTER — Other Ambulatory Visit: Payer: Self-pay | Admitting: Pulmonary Disease

## 2013-06-17 ENCOUNTER — Ambulatory Visit (INDEPENDENT_AMBULATORY_CARE_PROVIDER_SITE_OTHER): Payer: Medicare Other | Admitting: Endocrinology

## 2013-06-17 ENCOUNTER — Encounter: Payer: Self-pay | Admitting: Endocrinology

## 2013-06-17 VITALS — BP 138/64 | HR 85 | Temp 98.2°F | Ht 66.0 in | Wt 175.0 lb

## 2013-06-17 DIAGNOSIS — E538 Deficiency of other specified B group vitamins: Secondary | ICD-10-CM

## 2013-06-17 MED ORDER — CYANOCOBALAMIN 1000 MCG/ML IJ SOLN
1000.0000 ug | Freq: Once | INTRAMUSCULAR | Status: AC
  Administered 2013-06-17: 1000 ug via INTRAMUSCULAR

## 2013-06-17 NOTE — Progress Notes (Signed)
Subjective:    Patient ID: Jocelyn Mendez, female    DOB: November 27, 1930, 80 y.o.   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   no cbg record, but states cbg's are often mildly low in the afternoon.  pt states she otherwise feels well in general. Past Medical History  Diagnosis Date  . Acute bronchitis   . Unspecified essential hypertension   . Chest pain, unspecified   . Type II or unspecified type diabetes mellitus without mention of complication, not stated as uncontrolled   . Other and unspecified hyperlipidemia   . Unspecified hypothyroidism   . Esophageal reflux   . Diverticulosis of colon (without mention of hemorrhage)   . Unspecified disorder resulting from impaired renal function   . Generalized osteoarthrosis, unspecified site   . Lumbago   . Osteoporosis   . Headache(784.0)   . Anxiety state, unspecified   . Anemia of other chronic disease   . Other B-complex deficiencies   . Neuropathy in diabetes     bilat LE's    Past Surgical History  Procedure Laterality Date  . Appendectomy      History   Social History  . Marital Status: Divorced    Spouse Name: N/A    Number of Children: 6  . Years of Education: N/A   Occupational History  . Not on file.   Social History Main Topics  . Smoking status: Former Smoker -- 0.50 packs/day for 5 years    Types: Cigarettes    Quit date: 02/10/1973  . Smokeless tobacco: Former Systems developer    Types: Snuff    Quit date: 04/07/2010  . Alcohol Use: No  . Drug Use: No  . Sexual Activity: No   Other Topics Concern  . Not on file   Social History  Narrative   1 child passed away at 75 weeks old---?crib death    Current Outpatient Prescriptions on File Prior to Visit  Medication Sig Dispense Refill  . albuterol (PROVENTIL HFA;VENTOLIN HFA) 108 (90 BASE) MCG/ACT inhaler Inhale 2 puffs into the lungs every 6 (six) hours as needed for wheezing or shortness of breath.  1 Inhaler  6  . amLODipine (NORVASC) 5 MG tablet TAKE 1 TABLET (5 MG TOTAL) BY MOUTH DAILY.  30 tablet  6  . aspirin 81 MG tablet Take 81 mg by mouth daily.        Marland Kitchen atorvastatin (LIPITOR) 20 MG tablet TAKE 1 TABLET BY MOUTH EVERY DAY  30 tablet  6  . BAYER CONTOUR TEST test strip 1 EACH BY OTHER ROUTE 4 (FOUR) TIMES DAILY AS NEEDED FOR OTHER. AND LANCETS 2/DAT 250.03  100 each  6  . BD PEN NEEDLE NANO U/F 32G X 4 MM MISC USE AS DIRECTED WITH THE HUMULIN PEN INSULIN  100 each  3  . Brinzolamide-Brimonidine (SIMBRINZA) 1-0.2 % SUSP Place 1 drop into both eyes 2 (two) times daily.      . Cholecalciferol (VITAMIN D3) 2000 UNITS TABS Take 1 tablet by mouth 2 (two) times daily.      . cyanocobalamin (,VITAMIN B-12,) 1000 MCG/ML injection  Inject 1,000 mcg into the muscle every 30 (thirty) days.      . fenofibrate 160 MG tablet Take 160 mg by mouth daily.      . fenofibrate 160 MG tablet TAKE 1 TABLET EVERY DAY  90 tablet  1  . ferrous sulfate 325 (65 FE) MG EC tablet Take 325 mg by mouth 2 (two) times daily.       . furosemide (LASIX) 20 MG tablet Take 1 tablet (20 mg total) by mouth daily.  30 tablet  6  . glucose blood test strip 1 each by Other route 4 (four) times daily. Bayer contour test strips      . Insulin Isophane Human (HUMULIN N) 100 UNIT/ML Kiwkpen Inject 130 Units into the skin every morning.       . Insulin Pen Needle 31G X 8 MM MISC 1 each by Does not apply route. Use with insulin pen device, humulin      . Lancets (ONETOUCH ULTRASOFT) lancets Use as instructed  100 each  6  . Lancets 30G MISC 1 each by Does not apply route. Use to check blood sugars four times per  day      . levothyroxine (SYNTHROID, LEVOTHROID) 150 MCG tablet Take 150 mcg by mouth daily before breakfast.      . NEEDLE, DISP, 25 G (BD DISP NEEDLES) 25G X 5/8" MISC Use as directed to administer  the b12 injection monthly  25 each  0  . ranitidine (ZANTAC) 150 MG capsule Take 1 capsule by mouth at bedtime.      . [DISCONTINUED] insulin glargine (LANTUS SOLOSTAR) 100 UNIT/ML injection Inject 100 Units into the skin every morning.  45 mL  PRN   No current facility-administered medications on file prior to visit.    Allergies  Allergen Reactions  . Codeine     REACTION: change in mental status  . Diphenhydramine Hcl     REACTION: itching  . Quinine     REACTION: abnormal sensations in face  . Sulfonamide Derivatives     REACTION: rash    Family History  Problem Relation Age of Onset  . Diabetes Mother     BP 138/64  Pulse 85  Temp(Src) 98.2 F (36.8 C) (Oral)  Ht 5\' 6"  (1.676 m)  Wt 175 lb (79.379 kg)  BMI 28.26 kg/m2  SpO2 96%  Review of Systems Denies LOC and weight change.      Objective:   Physical Exam VITAL SIGNS:  See vs page GENERAL: no distress   Lab Results  Component Value Date   HGBA1C 6.1 05/18/2013       Assessment & Plan:  DM: overcontrolled OA: this limits exercise rx of DM.  Renal insufficiency: this increases the risk of hypoglycemia

## 2013-06-17 NOTE — Patient Instructions (Addendum)
check your blood sugar twice a day.  vary the time of day when you check, between before the 3 meals, and at bedtime.  also check if you have symptoms of your blood sugar being too high or too low.  please keep a record of the readings and bring it to your next appointment here.  You can write it on any piece of paper.  please call us sooner if your blood sugar goes below 70, or if you have a lot of readings over 200.   Please come back for a follow-up appointment in 3 months.   Please reduce the insulin to 130 units daily, all in the morning.

## 2013-08-12 ENCOUNTER — Other Ambulatory Visit: Payer: Self-pay | Admitting: Pulmonary Disease

## 2013-09-07 ENCOUNTER — Other Ambulatory Visit: Payer: Self-pay | Admitting: Pulmonary Disease

## 2013-09-20 ENCOUNTER — Ambulatory Visit (INDEPENDENT_AMBULATORY_CARE_PROVIDER_SITE_OTHER): Payer: Medicare Other | Admitting: Endocrinology

## 2013-09-20 ENCOUNTER — Encounter: Payer: Self-pay | Admitting: Endocrinology

## 2013-09-20 VITALS — BP 140/62 | HR 87 | Temp 97.9°F | Ht 66.0 in | Wt 174.0 lb

## 2013-09-20 DIAGNOSIS — E1065 Type 1 diabetes mellitus with hyperglycemia: Principal | ICD-10-CM

## 2013-09-20 DIAGNOSIS — E538 Deficiency of other specified B group vitamins: Secondary | ICD-10-CM

## 2013-09-20 DIAGNOSIS — E1029 Type 1 diabetes mellitus with other diabetic kidney complication: Secondary | ICD-10-CM

## 2013-09-20 LAB — BASIC METABOLIC PANEL
BUN: 28 mg/dL — AB (ref 6–23)
CO2: 21 meq/L (ref 19–32)
Calcium: 9.5 mg/dL (ref 8.4–10.5)
Chloride: 109 mEq/L (ref 96–112)
Creatinine, Ser: 1.6 mg/dL — ABNORMAL HIGH (ref 0.4–1.2)
GFR: 32.66 mL/min — AB (ref >60.00)
GLUCOSE: 152 mg/dL — AB (ref 70–99)
POTASSIUM: 4.2 meq/L (ref 3.5–5.1)
SODIUM: 140 meq/L (ref 135–145)

## 2013-09-20 LAB — MICROALBUMIN / CREATININE URINE RATIO
Creatinine,U: 131.6 mg/dL
MICROALB/CREAT RATIO: 32.9 mg/g — AB (ref 0.0–30.0)
Microalb, Ur: 43.3 mg/dL — ABNORMAL HIGH (ref 0.0–1.9)

## 2013-09-20 LAB — HEMOGLOBIN A1C: Hgb A1c MFr Bld: 6.9 % — ABNORMAL HIGH (ref 4.6–6.5)

## 2013-09-20 MED ORDER — CYANOCOBALAMIN 1000 MCG/ML IJ SOLN
1000.0000 ug | Freq: Once | INTRAMUSCULAR | Status: AC
  Administered 2013-09-20: 1000 ug via INTRAMUSCULAR

## 2013-09-20 NOTE — Progress Notes (Signed)
Subjective:    Patient ID: Jocelyn Mendez, female    DOB: Aug 01, 1930, 80 y.o.   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   no cbg record, but states cbg's vary from 100-150.  It is in general higher as the day goes on.  pt states she feels well in general.   Past Medical History  Diagnosis Date  . Acute bronchitis   . Unspecified essential hypertension   . Chest pain, unspecified   . Type II or unspecified type diabetes mellitus without mention of complication, not stated as uncontrolled   . Other and unspecified hyperlipidemia   . Unspecified hypothyroidism   . Esophageal reflux   . Diverticulosis of colon (without mention of hemorrhage)   . Unspecified disorder resulting from impaired renal function   . Generalized osteoarthrosis, unspecified site   . Lumbago   . Osteoporosis   . Headache(784.0)   . Anxiety state, unspecified   . Anemia of other chronic disease   . Other B-complex deficiencies   . Neuropathy in diabetes     bilat LE's    Past Surgical History  Procedure Laterality Date  . Appendectomy      History   Social History  . Marital Status: Divorced    Spouse Name: N/A    Number of Children: 6  . Years of Education: N/A   Occupational History  . Not on file.   Social History Main Topics  . Smoking status: Former Smoker -- 0.50 packs/day for 5 years    Types: Cigarettes    Quit date: 02/10/1973  . Smokeless tobacco: Former Systems developer    Types: Snuff    Quit date: 04/07/2010  . Alcohol Use: No  . Drug Use: No  . Sexual Activity: No   Other Topics Concern  . Not on file     Social History Narrative   1 child passed away at 41 weeks old---?crib death    Current Outpatient Prescriptions on File Prior to Visit  Medication Sig Dispense Refill  . albuterol (PROVENTIL HFA;VENTOLIN HFA) 108 (90 BASE) MCG/ACT inhaler Inhale 2 puffs into the lungs every 6 (six) hours as needed for wheezing or shortness of breath.  1 Inhaler  6  . amLODipine (NORVASC) 5 MG tablet TAKE 1 TABLET (5 MG TOTAL) BY MOUTH DAILY.  30 tablet  6  . aspirin 81 MG tablet Take 81 mg by mouth daily.        Marland Kitchen atorvastatin (LIPITOR) 20 MG tablet TAKE 1 TABLET BY MOUTH EVERY DAY  30 tablet  6  . BAYER CONTOUR TEST test strip 1 EACH BY OTHER ROUTE 4 (FOUR) TIMES DAILY AS NEEDED FOR OTHER. AND LANCETS 2/DAT 250.03  100 each  6  . BD PEN NEEDLE NANO U/F 32G X 4 MM MISC USE AS DIRECTED WITH THE HUMULIN PEN INSULIN  100 each  3  . Brinzolamide-Brimonidine (SIMBRINZA) 1-0.2 % SUSP Place 1 drop into both eyes 2 (two) times daily.      . Cholecalciferol (VITAMIN D3) 2000 UNITS TABS Take 1 tablet by mouth 2 (two) times daily.      Marland Kitchen  cyanocobalamin (,VITAMIN B-12,) 1000 MCG/ML injection Inject 1,000 mcg into the muscle every 30 (thirty) days.      . cyanocobalamin (,VITAMIN B-12,) 1000 MCG/ML injection INJECT 1 ML (1,000 MCG TOTAL) INTO THE MUSCLE ONCE.  1 mL  0  . fenofibrate 160 MG tablet Take 160 mg by mouth daily.      . fenofibrate 160 MG tablet TAKE 1 TABLET EVERY DAY  90 tablet  1  . ferrous sulfate 325 (65 FE) MG EC tablet Take 325 mg by mouth 2 (two) times daily.       . furosemide (LASIX) 20 MG tablet Take 1 tablet (20 mg total) by mouth daily.  30 tablet  6  . glucose blood test strip 1 each by Other route 4 (four) times daily. Bayer contour test strips      . Insulin Isophane Human (HUMULIN N) 100 UNIT/ML Kiwkpen Inject 120 Units into the skin every morning.       . Insulin Pen Needle 31G X 8 MM MISC 1 each by Does not apply route. Use with insulin pen device, humulin      . Lancets (ONETOUCH  ULTRASOFT) lancets Use as instructed  100 each  6  . Lancets 30G MISC 1 each by Does not apply route. Use to check blood sugars four times per day      . levothyroxine (SYNTHROID, LEVOTHROID) 150 MCG tablet Take 150 mcg by mouth daily before breakfast.      . levothyroxine (SYNTHROID, LEVOTHROID) 150 MCG tablet TAKE 1 TABLET (150 MCG TOTAL) BY MOUTH DAILY.  30 tablet  11  . NEEDLE, DISP, 25 G (BD DISP NEEDLES) 25G X 5/8" MISC Use as directed to administer  the b12 injection monthly  25 each  0  . ranitidine (ZANTAC) 150 MG capsule Take 1 capsule by mouth at bedtime.      . [DISCONTINUED] insulin glargine (LANTUS SOLOSTAR) 100 UNIT/ML injection Inject 100 Units into the skin every morning.  45 mL  PRN   No current facility-administered medications on file prior to visit.    Allergies  Allergen Reactions  . Codeine     REACTION: change in mental status  . Diphenhydramine Hcl     REACTION: itching  . Quinine     REACTION: abnormal sensations in face  . Sulfonamide Derivatives     REACTION: rash    Family History  Problem Relation Age of Onset  . Diabetes Mother     BP 140/62  Pulse 87  Temp(Src) 97.9 F (36.6 C) (Oral)  Ht 5\' 6"  (1.676 m)  Wt 174 lb (78.926 kg)  BMI 28.10 kg/m2  SpO2 97%   Review of Systems She denies hypoglycemia.  She has lost a few lbs.     Objective:   Physical Exam Pulses: dorsalis pedis intact bilat.   Feet: no deformity. normal color and temp.  no edema Skin:  no ulcer on the feet.   Neuro: sensation is intact to touch on the feet, but decreased from normal   Lab Results  Component Value Date   HGBA1C 6.9* 09/20/2013   Lab Results  Component Value Date   CREATININE 1.6* 09/20/2013   BUN 28* 09/20/2013   NA 140 09/20/2013   K 4.2 09/20/2013   CL 109 09/20/2013   CO2 21 09/20/2013        Assessment & Plan:  DM: well-controlled Renal insuff, stable.  We'll follow Weight loss: this helps DM control.  Pt is advised to  continue.       Patient is advised the following: Patient Instructions  check your blood sugar twice a day.  vary the time of day when you check, between before the 3 meals, and at bedtime.  also check if you have symptoms of your blood sugar being too high or too low.  please keep a record of the readings and bring it to your next appointment here.  You can write it on any piece of paper.  please call us sooner if your blood sugar goes below 70, or if you have a lot of readings over 200.   Please come back for a follow-up appointment in 3 months.   blood and urine tests are being requested for you today.  We'll contact you with results.

## 2013-09-20 NOTE — Patient Instructions (Addendum)
check your blood sugar twice a day.  vary the time of day when you check, between before the 3 meals, and at bedtime.  also check if you have symptoms of your blood sugar being too high or too low.  please keep a record of the readings and bring it to your next appointment here.  You can write it on any piece of paper.  please call us sooner if your blood sugar goes below 70, or if you have a lot of readings over 200.   Please come back for a follow-up appointment in 3 months.   blood and urine tests are being requested for you today.  We'll contact you with results.

## 2013-09-21 ENCOUNTER — Telehealth: Payer: Self-pay | Admitting: Pulmonary Disease

## 2013-09-21 NOTE — Telephone Encounter (Signed)
i have called and lmomtcb for the pt to schedule an appt.

## 2013-09-21 NOTE — Telephone Encounter (Signed)
Pt will need to come in for OV to have these forms for the DMV filled out. thanks

## 2013-09-22 NOTE — Telephone Encounter (Signed)
Pt scheduled for OV 09/26/13 at 10:30 with SN Nothing further needed.

## 2013-09-26 ENCOUNTER — Encounter: Payer: Self-pay | Admitting: Pulmonary Disease

## 2013-09-26 ENCOUNTER — Ambulatory Visit (INDEPENDENT_AMBULATORY_CARE_PROVIDER_SITE_OTHER): Payer: Medicare Other | Admitting: Pulmonary Disease

## 2013-09-26 VITALS — BP 140/60 | HR 68 | Temp 97.8°F | Ht 66.0 in | Wt 174.8 lb

## 2013-09-26 DIAGNOSIS — J9600 Acute respiratory failure, unspecified whether with hypoxia or hypercapnia: Secondary | ICD-10-CM

## 2013-09-26 DIAGNOSIS — Z794 Long term (current) use of insulin: Secondary | ICD-10-CM

## 2013-09-26 DIAGNOSIS — K5730 Diverticulosis of large intestine without perforation or abscess without bleeding: Secondary | ICD-10-CM

## 2013-09-26 DIAGNOSIS — M159 Polyosteoarthritis, unspecified: Secondary | ICD-10-CM

## 2013-09-26 DIAGNOSIS — K219 Gastro-esophageal reflux disease without esophagitis: Secondary | ICD-10-CM

## 2013-09-26 DIAGNOSIS — M810 Age-related osteoporosis without current pathological fracture: Secondary | ICD-10-CM

## 2013-09-26 DIAGNOSIS — M545 Low back pain, unspecified: Secondary | ICD-10-CM

## 2013-09-26 DIAGNOSIS — R269 Unspecified abnormalities of gait and mobility: Secondary | ICD-10-CM

## 2013-09-26 DIAGNOSIS — E538 Deficiency of other specified B group vitamins: Secondary | ICD-10-CM

## 2013-09-26 DIAGNOSIS — J9601 Acute respiratory failure with hypoxia: Secondary | ICD-10-CM

## 2013-09-26 DIAGNOSIS — E119 Type 2 diabetes mellitus without complications: Secondary | ICD-10-CM

## 2013-09-26 DIAGNOSIS — D638 Anemia in other chronic diseases classified elsewhere: Secondary | ICD-10-CM

## 2013-09-26 DIAGNOSIS — E039 Hypothyroidism, unspecified: Secondary | ICD-10-CM

## 2013-09-26 DIAGNOSIS — J45909 Unspecified asthma, uncomplicated: Secondary | ICD-10-CM

## 2013-09-26 DIAGNOSIS — N259 Disorder resulting from impaired renal tubular function, unspecified: Secondary | ICD-10-CM

## 2013-09-26 DIAGNOSIS — I10 Essential (primary) hypertension: Secondary | ICD-10-CM

## 2013-09-26 DIAGNOSIS — E785 Hyperlipidemia, unspecified: Secondary | ICD-10-CM

## 2013-09-26 DIAGNOSIS — J4530 Mild persistent asthma, uncomplicated: Secondary | ICD-10-CM

## 2013-09-26 NOTE — Patient Instructions (Signed)
Today we updated your med list in our EPIC system...    Continue your current medications the same...  We filled out your -DMV form today & we will fax it to [facility] as required...  Call for any questions...  Let's plan a follow up visit in 5mo, sooner if needed for problems.Marland KitchenMarland Kitchen

## 2013-09-26 NOTE — Progress Notes (Signed)
Subjective:    Patient ID: Jocelyn Mendez, female    DOB: [birth year], 80 y.o.   MRN: [id number]  HPI 80 y/o WF here for a follow up visit... she has mult med problems including:  HBP, Hypercholesterolemia, DM, mild renal insuffic, & multifactorial anemia w/ B12 defic on shots monthly... she also has DJD, LBP, Osteopenia, etc... ~  SEE PREV EPIC NOTES FOR PRIOR DATA >>   ~  January 19, 2012:  26mo ROV & prev infected cyst has resolved; here for f/u visit & she reports doing fine- no new complaints or concerns...  We reviewed the following medical problems during today's office visit>>     HxAB> she gets freq exac & requires antibiotics, etc...    HBP> on Amlod5, Lasix20mg  prn swelling; BP= 142/70 & she denies CP, palpit, ch in SOB, edema, etc...    HxCP> on ASA81 & she needs to be more active, incr exercise program...    CHOL> on Lip20 & Feno160; last FLP 8/13 showed TChol 159, TG 232, HDL 30, LDL 99    DM> on NovolinN per DrEllison- currently 110u Qam; last labs 8/13 showed BS=89 & A1c=8.8    Hypothy> on Synthroid150; 8/13 TSH=20.2; reminded to take it everyday first thing in the AM on empty stomach...    GI- GERD, Divertics> on Zantac150; she denies abd pain, dysphagia, n/v, d/c, blood seen...    Renal Insiuffic> she has seen [facility] for Nephrology; stable off nephrotoxic meds; Creat~1.6-1.7 range...    DJD, LBP, Osteopenia> on calcium, MVI, Vit D supplement; uses Tylenol prn pain; notes discomfort in hands etc...    Anxiety> on Alpraz0.25 as needed...    Anemia, B12 Defic> on Fe325mg /d & B12 injection monthly We reviewed prob list, meds, xrays and labs> see below for updates >>   ~  May 17, 2012:  92mo ROV & Jocelyn Mendez reports a good interval- feels well w/o new complaints or concerns... We reviewed the following medical problems during today's office visit >>     HxAB> she reports breathing is good w/o recent resp exac & denies much cough, sput, SOB, etc...    HBP on Amlod5, Lasix20mg   prn swelling; BP= 120/66 & she denies CP, palpit, ch in SOB, edema, etc...    Chol controlled on Lip20 & Feno160; FLP today showed TChol 163, TG 240, HDL 27, LDL 101; we discussed better low fat diet, incr exercise & wt reduction...    She saw DrEllison earlier today for f/u DM w/ RI & PN; on NPH insulin taking 110u daily & labs today showed BS= 148, A1c= 9.7 (it was 8.8 in [place]) and DrEllison is in charge of her DM meds...    She remains on Synthroid150; reminded to take it everyday first thing in the AM on empty stomach; TSH = 16.91 and it seems unlikely that 141mcg isn't enough for her, reminded to take it every day & bring all med bottles to every office visit...    She saw Nephrology last 10/13 [facility]) w/ Stage3 CKD; Creat stable ~1.7 & calcGFR ~30; no further suggestions & disch from her clinic; labs today showed Cr= 1.7, stable...    Anxiety> on Alpraz0.25 prn...    She has Anemia of chr dis, Hx low Fe, & B12 defic> w/ Hg in the 10-12 range on Fe-bid & B12 shots monthly; Labs 4/14 showed Hg=11.8, continue same... We reviewed prob list, meds, xrays and labs> see below for updates >>   LABS 4/14:  FLP- Chol  ok but TG=240, HDL=27;  Chems- ok x BS=148, Aic=9.7, Cr=1.7;  CBC w/ Hg=11.8;  TSH=16.91;  VitD=34   ~  November 16, 2012:  49mo ROV & Jocelyn Mendez reports stable overall- we reviewed the following medical problems during today's office visit >>     HxAB> she gets freq exac & requires antibiotics etc, but not on regular meds...    HBP> on Amlod5, Lasix2; BP= 138/60 & she denies CP, palpit, ch in SOB, edema, etc...    HxCP> on ASA81 & she needs to be more active, incr exercise program...    CHOL> on Lip20 & Feno160; FLP 10/14 showed TChol 171, TG 245, HDL 31, LDL 102... Rec to continue same + better low fat diet.    DM> on NovolinN per DrEllison- currently 120u Qam & 10upm; Labs 10/14 showed BS=124 & A1c=8.0    Hypothy> on Synthroid150; Labs 10/14 showed TSH=18.39 & DrEllison is aware, pt  clinically euthyroid; reminded to take it everyday first thing in the AM on empty stomach.    GI- GERD, Divertics> on Zantac150; she denies abd pain, dysphagia, n/v, d/c, blood seen...    Renal Insiuffic> she has seen [facility] for Nephrology; stable off nephrotoxic meds; Creat~1.6-1.7 range & stable...    DJD, LBP, Osteopenia> on calcium, MVI, Vit D supplement; uses Tylenol prn pain; notes discomfort in hands etc...    Anxiety> prev on Alpraz0.25 as needed...    Anemia, B12 Defic> on Fe325mg /d & B12 injection monthly; labs reviewed in Epic. We reviewed prob list, meds, xrays and labs> see below for updates >> She received the 2014 Flu vaccine today & a B12 shot...  LABS 101/14:  FLPo on Lip20+Feno160 but TG=245 & needs better low fat diet;  Chems- ok x BS=124 A1c=8.0 Creat=1.6;  CBC- ok w/ Hg=12.1;  TSH=18.39 on 147mcg/d & reminded to take in AM on empty stomach every day!  ~  May 18, 2013:  50mo ROV & post hosp visit> Jocelyn Mendez was hosp 1/21 - 03/09/13 by [facility] w/ an ILI, COPD exac, and resp failure; treated w/ Tamiflu, IV antibiotics=> Levaquin and Pred; states that shes "doing good now" & daugh is happy w/ her progress... She will be moving to [place] and [place] to be close to one of her daughters...     BP remains controlled on Amlod5 and prn Lasix20 (hasn't needed she says- not swelling); BP= 130/62 today & she denies CP, palpit, ch in SOB, edema, etc...    Lipids have been fair on combination of Lip20 & Feno160> last FLP 10/14 showed TChol 171, TG 245, HDL 31, LDL 102; we reviewed low fat diet...     She continues to f/u w/ DrEllison for Endocrine/DM> on Humulin N 145u daily at present; last visit was 1/15 and note reviewed; labs showed BS= 200-500 during the hosp and A1c= 7-8 range; labs today showed BS=136, A1c=6.1.Marland KitchenMarland Kitchen     GI is stable on Zantac150 & she denies GI symptoms, doing well...    She saw Nephrology, DrGoldsborough in the past w/ Stage3 CKD, believed to be related to  DM & HBP; Creat had been stable in the 1.5 range, bumped up to 2.4 during the 1/15 hosp & back to 1.5 now...    Similarly she was anemic w/ Hg~10 and now improved to 11.8 w/ Fe=89 (24%) We reviewed prob list, meds, xrays and labs> see below for updates >> OK B12 shot today...  CXR 1/15 showed clear lungs, norm heart size, NAD...  PFT  4/15 showed FVC=1.97 (68%), FEV1=1.49 (71%), %1sec=76, mid-flows=78%predicted... c/w mild-mod restrictive disease...  LABS 4/15:  Chems- ok x BS=136, A1c=6.1, Cr=1.5;  CBC- ok w/ Hg=11.8;  Fe=89 (24%sat);  TSH=4.56  ~  September 26, 2013:  50mo ROV recheck- Jocelyn Mendez brought in a DMV form from [place] for completion today (Done & we mailed it into [facility])...  She reports that she is doing satis, no new complaints or concerns today;  She continues to f/u w/ DrEllison Q40mo for her DM (on NPH 120u/d) & was last seen 09/20/13 w/ BS=152, A1c=6.9, Cr=1.6;  BP is adeq controlled on Amlod5 & Lasix20, measures 140./66 today 7 she denies CP, palpit, SOB or change in edema;  "I eat a ton of salt" she says & we reviewed again the need to elim sodium from her diet;  Thyroid remains stable & clinically euthyroid on her 176mcg/d dose...  She remains on Fe, VitD & Vit B12 shots...  We reviewed prob list, meds, xrays and labs> see below for updates >>            Problem List:  Hx of ASTHMATIC BRONCHITIS, ACUTE (ICD-466.0) - no recent bronchitic episodes... the increased activity has really helped her... ~  CXR 5/12 showed clear lungs x mild scarring left base & apical pleural thickening... ~  CXR 6/13 showed norm heart size, min streaky atx at basesw/ sl peribronchial thickening c/w bronchitis, otherw clear, NAD... ~  [facility] 1/15 by [facility] w/ ILI, "COPD exac" and resp failure; CXR was clear, she responded to antibiotics and Pred course=> back to baseline... ~  PFT 4/15 showed FVC=1.97 (68%), FEV1=1.49 (71%), %1sec=76, mid-flows=78%predicted... c/w mild-mod restrictive  disease  HYPERTENSION (ICD-401.9) - prev on Diovan/HCT but this was stopped 4/12 Hosp & now on NORVASC 5mg /d... ~  10/12:  BP= 128/62 & she denies CP, palpit, syncope, SOB, edema... ~  2/13:  BP= 130/70 & she feels well & denies symptoms... ~  4/13:  DrGoldsborough started LASIX 20mg /d as needed for swelling... ~  6/13:  BP= 120/60 & she notes some CP from her cough, but no palpit/ ch in SOB, etc... ~  8/13:  BP= 130/60 & although she c/o left CWP, she denies palpit, ch in SOB, recent edema, etc... ~  12/13: on Amlod5, Lasix20mg  prn swelling; BP= 142/70 & she denies CP, palpit, ch in SOB, edema, etc... ~  4/14:  on Amlod5, Lasix20mg  prn swelling; BP= 120/66 & she denies CP, palpit, ch in SOB, edema, etc... ~  10/14: on Amlod5, Lasix20; BP= 138/60 & she denies CP, palpit, ch in SOB, edema, etc... ~  4/15: on Amlod5 and prn Lasix20 (hasn't needed she says- not swelling); BP= 130/62 today & she denies CP, palpit, ch in SOB, edema, etc.  Hx of CHEST PAIN (ICD-786.50) - on ASA 81mg /d;  she continues to experience intermittent sharp left CWP- Rx w/ Tylenol Prn. ~  cath 1988 showed normal coronaries, min MVP... ~  Cards eval 4/12 by DrNishan> mult risk factors, atypCP, EKG w/ NSSTTWA, planned Myoview but not done due to subseq hosp 4/12... ~  8/13:  She is c/o left CWP- sharp, tender in left axillary area, etc; discussed rest, heat, Tylenol (she does not want stronger pain med).  DM (ICD-250.00) - followed by DrEllison> DM w/ Nephropathy & Neuropathy> on HUMULIN N 100u daily; (off Metformin rx since 4/12 hosp)... ~  labs 8/08 showed BS= 113, HgA1c=5.4.Marland KitchenMarland Kitchen On Metform + Glimep. ~  labs 4/09 showed BS= 99, HgA1c= 5.5.Marland KitchenMarland Kitchen Stopped  Glimep. ~  labs 4/10 showed BS= 125, A1c= 7.1.Marland Kitchen. rec> better diet, same meds for now. ~  labs 4/11 showed BS= 266, A1c= 9.1.Marland KitchenMarland Kitchen what happened? Add Glimep1mg /d back. ~  labs 6/11 showed BS= 195, A1c= 7.9.Marland KitchenMarland Kitchen incr Glimep to 2mg /d. ~  labs 10/11 showed BS= 175, A1c= 7.9.Marland KitchenMarland Kitchen rec incr  Gimep to 4mg /d. ~  Labs in [facility] 5/12 showed BS= 232==>139; A1c=9.7; Metform stopped in hosp, on [place]. ~  Labs 5/12 post hosp showed BS= 255, A1c= 8.6.Marland KitchenMarland Kitchen Referred to Endocrinology. ~  7/12:  BS at home all in the 200s, she doesn't want additional meds, wants to wait for appt w/ DrKerr 7/24. ~  8/12:  She was seen by DrKerr & her started her on Levemir insulin + Onglyza 2.5mg /d ==> his notes & labs are reviewed... ~  10/12: Note from DrKerr> pt on Levemir 80u + Onglyza 2.5mg /d w/ A1c= 9.0.Marland Kitchen. ~  Labs here 2/13 showed FBS= 229, A1c= 11.0 & we will fax to DrKerr for his review... ~  Pt switched to DrEllison in the interval> meds changed to HUMULIN N & now on 100u daily w/ recent A1c=8.9.Marland Kitchen. ~  Labs 8/13 on HumulinN100/d showed BS= 89, A1c= 8.8... We reviewed diet, exercise, & f/u w/ DrEllison. ~  4/14: on NPH insulin taking 110u daily & labs today showed BS= 148, A1c= 9.7 (it was 8.8 in [place]) and DrEllison is in charge of her DM meds. ~  10/14: on NovolinN per DrEllison- currently 120u Qam & 10upm; Labs 10/14 showed BS=124 & A1c=8.0.Marland KitchenMarland Kitchen Med adjustments per DrEllison. ~  4/15: She continues to f/u w/ DrEllison for Endocrine/DM> on Humulin N 145u daily at present; last visit was 1/15 and note reviewed; labs showed BS= 200-500 during the hosp and A1c= 7-8 range; labs today showed BS=136, A1c=6.1  HYPERLIPIDEMIA (ICD-272.4) - on LIPITOR 20mg /d & FENOFIBRATE 160mg /d (ch from Lopid 10/10). ~  FLP 8/08 showed TChol 129, TG 197, HDL 17, LDL 73... despite taking statin & fibrate drugs. ~  FLP 4/09 on Lip20+Tric145 showed TChol 127, TG 269, HDL 13, LDL 69... rec> ch Tricor to [phone]. ~  [place] 10/09 on Lip20+Trilip135 showed TChol 124, TG 247, HDL 10!, LDL 64... rec> ch Trilip to LopidBid. ~  FLP 4/10 on Lip20+LopidBid showed TChol 174, TG 239, HDL 38, LDL 103... rec> ch Lopid to Feno160 (she never did). ~  FLP 10/10 showed TChol 134, TG 256, HDL 10, LDL 76... change the Lopid to Fenofib160/d. ~  FLP  4/11 on Lip20+Feno160 showed TChol 163, TG 234, HDL 28, LDL 108... keep same, better diet! ~  FLP 10/11 on Lip20+Feno160 showed TChol 172, Tg 304, HDL 26, LDL 112... take meds regularly & better diet. ~  FLP 4/12 on Lip20+Feno160 showed TChol 160, TG 258, HDL 19, LDL 100... Needs better low fat diet. ~  FLP 2/13 on Lip20+Feno160 showed TChol 147, TG 165, HDL 30, LDL 85 ~  FLP 8/13 on Lip20+Feno160 showed TChol 159, TG 232, HDL 30, LDL 99 ~  FLP 4/14 on Lip20+Feno160 showed TChol 163, TG 240, HDL 27, LDL 101 ~  FLP 10/14 on Lip20+Feno160 showed TChol 171, TG 245, HDL 31, LDL 102... Rec to continue same + better low fat diet.   HYPOTHYROIDISM (ICD-244.9) - Hx remote Thyroid surg & scar in neck> on SYNTHROID replacement. ~  last TSH 1/08 = 0.41 ~  labs 4/09 showed TSH= 2.35 ~  labs 10/09 on Levothy125 showed TSH= 0.97 ~  labs 4/10 on Levothy125  showed TSH= 0.08... rec> decr [phone]. ~  labs 10/10 on Levothy112 showed TSH= 2.18... keep same. ~  labs 4/11 on Levo112 showed TSH= 0.51 ~  Labs 4/12 on Levo112 showed TSH= 1.02 ~  Labs 2/13 on Levo112 showed TSH= 20.8.Marland KitchenMarland Kitchen Why? Pharm confirms regular refills, we will incr dose to 124mcg/d. ~  Labs 8/13 on Levo125 showed TSH= 20.2.Marland KitchenMarland Kitchen We decided to incr LEVOTHYROID 119mcg/d... ~  Labs 4/14 on Levo150 showed TSH= 16.91... This dose should be adeq & rec to take 1st thing in AM on empty stomach etc... ~  Labs 10/14 on Levothy150 showed TSH= 18.39... Rec to take in AM on empty stomach & every day... ~  Labs 4/15 on Levothy150 showed TSH= 4.56  GERD (ICD-530.81) - takes NEXIUM 40mg  Prn (states Omep20 "makes me sick")...  ~  EGD was planned in 2006 eval but never done... ~  6/13:  She want to switch to ZANTAC 150mg /d since Nexium is too expensive  DIVERTICULOSIS OF COLON (ICD-562.10) - c/o constip & rec> Miralax + Senakot-S... ~  last colonoscopy 3/06 by DrDBrodie showed divertics only...  RENAL INSUFFICIENCY (ICD-588.9) - prev Creat in the 1.7 - 2.0  range... ~  labs 8/08 showed BUN=26, Creat=1.5 ~  labs 4/09 showed BUN= 34, Creat= 1.5 ~  labs 10/09 showed BUN= 31, Creat= 1.5 ~  labs 4/10 showed BUN= 28, Creat= 1.0, Microalb= pos... continue Rx (on Diovan). ~  labs 10/10 showed BUN= 22, Creat= 1.2 ~  labs 4/11 showed BUN= 20, Creat= 1.5 ~  labs 6/11 showed BUN= 20, Creat= 1.3 ~  labs 10/11 showed BUN= 27, Creat= 1.5 ~  Renal Ultrasound 5/12 was neg- NAD, no hydronephrosis... ~  Labs in [facility] 5/12 showed Creat= 2.11==>1.4 ~  Labs 5/12 post hosp showed BUN=44, Creat=1.7, K=5.7, HCO3=23;  She has appt w/ Nephrology set up by the Hospitalists. ~  6/12 & 10/12:  Pt seen by DrGoldsborough & her notes are reviewed... ~  Labs 2/13 showed BUN= 33, Creat= 1.6 ~  Labs 8/13 showed BUN= 34, Creat= 1.7 ~  10/13: she had f/u DrGoldborough> Cr stable at 1.7, due to HBP + DM, no other rev causes found, released from her clinic... ~  Labs 4/14 showed BUN= 27, Creat= 1.7 ~  Labs 10/14 showed BUN= 26, Cr= 1.6 ~  [facility] 1/15 w/ ILI and resp failure> Cr rose to 2.4 & back to 1.7 by disch... ~  Labs 4/15 showed BUN= 21, Cr= 1.5  DEGENERATIVE JOINT DISEASE, GENERALIZED (ICD-715.00) - she uses Tylenol Prn. LOW BACK PAIN (ICD-724.2) OSTEOPOROSIS (ICD-733.00) - she takes calcium + vits daily... ~  BMD here 2/06 showed TScores -1.4 Spine,  -1.1 left FemNeck... ~  labs 4/09 showed Vit D level= 24... rec OTC Vit D 1000 u daily, but she stopped this. ~  labs 4/10 showed Vit D level = 20... rec> incr OTC Vit D 2000 u daily (she never did). ~  labs 4/11 showed Vit D level = 22...Marland KitchenMarland KitchenMarland Kitchen rec> incr to 2000 u daily. ~  Labs 4/12 showed Vit D level = 32 ~  Labs 4/14 showed Vit D level = 34, rec to continue supplements...  HEADACHE (ICD-784.0)  ANXIETY (ICD-300.00) - she notes her Bro has emphysema, lung cancer and CHF... she takes ALPRAZOLAM 0.5mg  Prn.  ANEMIA>  Hx ACD & IronDefic>  Rx FeSO4 + VitC ~  Hx of multifactorial anemia w/ Hg=11 in 2005, Fe was OK, Stools neg  for blood, Folate norm, B12 found low, &  Creat 1.5+ ~  labs 1/08 showed Hg=11.1 ~  labs 8/08 showed Hg=9.9 ~  labs 4/09 showed Hg= 9.6, Fe=102, TIBC=344, Sat=21% ~  labs 10/09 showed Hg= 9.1, MCV= 92 ~  labs 4/10 showed = 11.1, Fe= 71 ~  labs 10/10 showed Hg= 9.3, MCV= 93... rec Fe Bid w/ Vit C... ~  labs 4/11 showed Hg= 11.8, MCV=87, Fe=86 ~  labs 10/11 showed Hg= 10.9, MCV= 89 ~  Labs in [facility] 5/12 showed Hg= 10.3==>8.6, Fe=19 (5%sat), B12=505, Folate=16, SPE= neg. ~  Labs 2/13 showed Hg= 12.0 ~  Labs 4/14 showed Hg= 11.8 ~  Labs 10/14 showed Hg= 12.1 ~  Labs showed Hg ~10 during 1/15 hosp... ~  Labs 4/15 showed Hg= 11.8  VITAMIN B12 DEFICIENCY (ICD-266.2) - Vit B12 level was 77 in 2006 w/ pos parietal cell antibodies; she's been on B12 shots ever since then...  ~  f/u B12 level 4/10 = >1500 ~  f/u B12 level 5/12= 505 ~  She remains on monthly Vit B12 shots...   Past Surgical History  Procedure Laterality Date  . Appendectomy      Outpatient Encounter Prescriptions as of 09/26/2013  Medication Sig  . albuterol (PROVENTIL HFA;VENTOLIN HFA) 108 (90 BASE) MCG/ACT inhaler Inhale 2 puffs into the lungs every 6 (six) hours as needed for wheezing or shortness of breath.  Marland Kitchen amLODipine (NORVASC) 5 MG tablet TAKE 1 TABLET (5 MG TOTAL) BY MOUTH DAILY.  Marland Kitchen aspirin 81 MG tablet Take 81 mg by mouth daily.    Marland Kitchen atorvastatin (LIPITOR) 20 MG tablet TAKE 1 TABLET BY MOUTH EVERY DAY  . BAYER CONTOUR TEST test strip 1 EACH BY OTHER ROUTE 4 (FOUR) TIMES DAILY AS NEEDED FOR OTHER. AND LANCETS 2/DAT 250.03  . BD PEN NEEDLE NANO U/F 32G X 4 MM MISC USE AS DIRECTED WITH THE HUMULIN PEN INSULIN  . Brinzolamide-Brimonidine (SIMBRINZA) 1-0.2 % SUSP Place 1 drop into both eyes 2 (two) times daily.  . Cholecalciferol (VITAMIN D3) 2000 UNITS TABS Take 1 tablet by mouth 2 (two) times daily.  . cyanocobalamin (,VITAMIN B-12,) 1000 MCG/ML injection INJECT 1 ML (1,000 MCG TOTAL) INTO THE MUSCLE ONCE.  .  fenofibrate 160 MG tablet TAKE 1 TABLET EVERY DAY  . ferrous sulfate 325 (65 FE) MG EC tablet Take 325 mg by mouth 2 (two) times daily.   . furosemide (LASIX) 20 MG tablet Take 1 tablet (20 mg total) by mouth daily.  Marland Kitchen glucose blood test strip 1 each by Other route 4 (four) times daily. Bayer contour test strips  . Insulin Isophane Human (HUMULIN N) 100 UNIT/ML Kiwkpen Inject 120 Units into the skin every morning.   . Insulin Pen Needle 31G X 8 MM MISC 1 each by Does not apply route. Use with insulin pen device, humulin  . Lancets (ONETOUCH ULTRASOFT) lancets Use as instructed  . Lancets 30G MISC 1 each by Does not apply route. Use to check blood sugars four times per day  . levothyroxine (SYNTHROID, LEVOTHROID) 150 MCG tablet TAKE 1 TABLET (150 MCG TOTAL) BY MOUTH DAILY.  Marland Kitchen NEEDLE, DISP, 25 G (BD DISP NEEDLES) 25G X 5/8" MISC Use as directed to administer  the b12 injection monthly  . ranitidine (ZANTAC) 150 MG capsule Take 1 capsule by mouth at bedtime.  . [DISCONTINUED] cyanocobalamin (,VITAMIN B-12,) 1000 MCG/ML injection Inject 1,000 mcg into the muscle every 30 (thirty) days.  . [DISCONTINUED] fenofibrate 160 MG tablet Take 160 mg by mouth daily.  . [  DISCONTINUED] levothyroxine (SYNTHROID, LEVOTHROID) 150 MCG tablet Take 150 mcg by mouth daily before breakfast.    Allergies  Allergen Reactions  . Codeine     REACTION: change in mental status  . Diphenhydramine Hcl     REACTION: itching  . Quinine     REACTION: abnormal sensations in face  . Sulfonamide Derivatives     REACTION: rash    Current Medications, Allergies, Past Medical History, Past Surgical History, Family History, and Social History were reviewed in [employer] record.    Review of Systems        See HPI - all other systems neg except as noted... The patient complains of nausea & diarrhea- now resolved.  The patient denies anorexia, fever, weight loss, weight gain, vision loss, decreased  hearing, hoarseness, syncope, peripheral edema, prolonged cough, headaches, hemoptysis, abdominal pain, melena, hematochezia, severe indigestion/heartburn, hematuria, incontinence, suspicious skin lesions, transient blindness, depression, unusual weight change, abnormal bleeding, enlarged lymph nodes, and angioedema.     Objective:   Physical Exam      WD, WN, 80 y/o WF in NAD...  VS reviewed & BP= 120/64 w/o postural changes... GENERAL:  Alert & oriented; pleasant & cooperative... HEENT:  [place]/AT, EOM-wnl, PERRLA, EACs-clear, TMs- sl red on left, NOSE-clear, THROAT-clear & wnl. NECK:  Decr ROM; no JVD; normal carotid impulses w/o bruits; +thyroid scar, no thyromegaly or nodules palpated; no lymphadenopathy. CHEST:  Clear to P & A; without wheezes/ rales/ or rhonchi heard; +tender chest wall & pectoralis area. HEART:  Regular Rhythm; without murmurs/ rubs/ or gallops detected... ABDOMEN:  Soft & nontender; normal bowel sounds; no organomegaly or masses palpated... EXT: without deformities, mild arthritic changes; no varicose veins/ +venous insuffic/ no edema. NEURO:  CN's intact; no focal neuro deficits or weakness found... DERM:  no lesions noted...  RADIOLOGY DATA:  Reviewed in the EPIC EMR & discussed w/ the patient...   LABORATORY DATA:  Reviewed in the EPIC EMR & discussed w/ the patient...   Assessment & Plan:    AB, pulm restriction by PFT>  No recent infectious exac; she knows to get yearly flu vaccine etc...  Hypertension>  Her BP remains controlled on Amlodipine monotherapy (+Lasix20 prn), & specifically her BP is not too low & there are no postural changes;  Continue same med...  Hx of CP>  She was seen 05/13/10 for a routine 69mo check up & daugh mentioned her atypCP & wanted cardiac eval & seen by DrNishan; EKG w/ poor R progression & NSSTTWA....  Diabetes>  Now followed & managed by DrEllison on HUMULIN N at 145u daily; A1c is 6.1 currently & she has f/u  soon...  Hyperlipidemia>  On Lipitor & Fenofibrate, needs better low fat diet, continue same meds...  Renal Insuffic>  Prev followed by DrGoldsborough & Creat is stable at 1.5.Marland KitchenMarland Kitchen  Hypothyroid>  TSH suddenly incr to ~20 on 110mcg/d, the dose grad incr to 125mcg/d; TSH now 4.56 & clinically stable, encouraged to take the med daily...  Anemia>  Multifactorial, on B12 shots monthly & Fe/ VitC supplementation...  Anxiety>  She stopped her prn Alprazolam...  Infected sebaceous cyst on back> resolved...   Patient's Medications  New Prescriptions   No medications on file  Previous Medications   ALBUTEROL (PROVENTIL HFA;VENTOLIN HFA) 108 (90 BASE) MCG/ACT INHALER    Inhale 2 puffs into the lungs every 6 (six) hours as needed for wheezing or shortness of breath.   AMLODIPINE (NORVASC) 5 MG TABLET  TAKE 1 TABLET (5 MG TOTAL) BY MOUTH DAILY.   ASPIRIN 81 MG TABLET    Take 81 mg by mouth daily.     ATORVASTATIN (LIPITOR) 20 MG TABLET    TAKE 1 TABLET BY MOUTH EVERY DAY   BAYER CONTOUR TEST TEST STRIP    1 EACH BY OTHER ROUTE 4 (FOUR) TIMES DAILY AS NEEDED FOR OTHER. AND LANCETS 2/DAT 250.03   BD PEN NEEDLE NANO U/F 32G X 4 MM MISC    USE AS DIRECTED WITH THE HUMULIN PEN INSULIN   BRINZOLAMIDE-BRIMONIDINE (SIMBRINZA) 1-0.2 % SUSP    Place 1 drop into both eyes 2 (two) times daily.   CHOLECALCIFEROL (VITAMIN D3) 2000 UNITS TABS    Take 1 tablet by mouth 2 (two) times daily.   CYANOCOBALAMIN (,VITAMIN B-12,) 1000 MCG/ML INJECTION    INJECT 1 ML (1,000 MCG TOTAL) INTO THE MUSCLE ONCE.   FENOFIBRATE 160 MG TABLET    TAKE 1 TABLET EVERY DAY   FERROUS SULFATE 325 (65 FE) MG EC TABLET    Take 325 mg by mouth 2 (two) times daily.    FUROSEMIDE (LASIX) 20 MG TABLET    Take 1 tablet (20 mg total) by mouth daily.   GLUCOSE BLOOD TEST STRIP    1 each by Other route 4 (four) times daily. Bayer contour test strips   INSULIN ISOPHANE HUMAN (HUMULIN N) 100 UNIT/ML KIWKPEN    Inject 120 Units into the skin every  morning.    INSULIN PEN NEEDLE 31G X 8 MM MISC    1 each by Does not apply route. Use with insulin pen device, humulin   LANCETS (ONETOUCH ULTRASOFT) LANCETS    Use as instructed   LANCETS 30G MISC    1 each by Does not apply route. Use to check blood sugars four times per day   LEVOTHYROXINE (SYNTHROID, LEVOTHROID) 150 MCG TABLET    TAKE 1 TABLET (150 MCG TOTAL) BY MOUTH DAILY.   NEEDLE, DISP, 25 G (BD DISP NEEDLES) 25G X 5/8" MISC    Use as directed to administer  the b12 injection monthly   RANITIDINE (ZANTAC) 150 MG CAPSULE    Take 1 capsule by mouth at bedtime.  Modified Medications   No medications on file  Discontinued Medications   CYANOCOBALAMIN (,VITAMIN B-12,) 1000 MCG/ML INJECTION    Inject 1,000 mcg into the muscle every 30 (thirty) days.   FENOFIBRATE 160 MG TABLET    Take 160 mg by mouth daily.   LEVOTHYROXINE (SYNTHROID, LEVOTHROID) 150 MCG TABLET    Take 150 mcg by mouth daily before breakfast.

## 2013-09-28 ENCOUNTER — Other Ambulatory Visit: Payer: Self-pay | Admitting: Pulmonary Disease

## 2013-11-10 ENCOUNTER — Encounter: Payer: Self-pay | Admitting: Internal Medicine

## 2013-12-17 ENCOUNTER — Other Ambulatory Visit: Payer: Self-pay | Admitting: Pulmonary Disease

## 2013-12-19 ENCOUNTER — Telehealth: Payer: Self-pay | Admitting: Pulmonary Disease

## 2013-12-19 MED ORDER — FENOFIBRATE 160 MG PO TABS: ORAL_TABLET | ORAL | Status: DC

## 2013-12-19 NOTE — Telephone Encounter (Signed)
Pt aware RX has been sent in. Nothing further needed 

## 2013-12-21 ENCOUNTER — Ambulatory Visit (INDEPENDENT_AMBULATORY_CARE_PROVIDER_SITE_OTHER): Payer: Medicare Other | Admitting: Endocrinology

## 2013-12-21 ENCOUNTER — Encounter: Payer: Self-pay | Admitting: Endocrinology

## 2013-12-21 VITALS — BP 122/60 | HR 73 | Temp 98.7°F | Ht 66.0 in | Wt 172.0 lb

## 2013-12-21 DIAGNOSIS — E1065 Type 1 diabetes mellitus with hyperglycemia: Principal | ICD-10-CM

## 2013-12-21 DIAGNOSIS — IMO0002 Reserved for concepts with insufficient information to code with codable children: Secondary | ICD-10-CM

## 2013-12-21 DIAGNOSIS — E1029 Type 1 diabetes mellitus with other diabetic kidney complication: Secondary | ICD-10-CM

## 2013-12-21 LAB — LIPID PANEL
Cholesterol: 147 mg/dL (ref 0–200)
HDL: 24.5 mg/dL — ABNORMAL LOW (ref >39.00)
NONHDL: 122.5
Total CHOL/HDL Ratio: 6
Triglycerides: 282 mg/dL — ABNORMAL HIGH (ref 0.0–149.0)
VLDL: 56.4 mg/dL — ABNORMAL HIGH (ref 0.0–40.0)

## 2013-12-21 LAB — BASIC METABOLIC PANEL
BUN: 29 mg/dL — ABNORMAL HIGH (ref 6–23)
CO2: 19 meq/L (ref 19–32)
CREATININE: 1.7 mg/dL — AB (ref 0.4–1.2)
Calcium: 10.1 mg/dL (ref 8.4–10.5)
Chloride: 112 mEq/L (ref 96–112)
GFR: 31.29 mL/min — ABNORMAL LOW (ref >60.00)
GLUCOSE: 82 mg/dL (ref 70–99)
Potassium: 3.8 mEq/L (ref 3.5–5.1)
SODIUM: 144 meq/L (ref 135–145)

## 2013-12-21 LAB — HEMOGLOBIN A1C: HEMOGLOBIN A1C: 6.7 % — AB (ref 4.6–6.5)

## 2013-12-21 LAB — TSH: TSH: 6.04 u[IU]/mL — AB (ref 0.35–4.50)

## 2013-12-21 NOTE — Patient Instructions (Addendum)
check your blood sugar twice a day.  vary the time of day when you check, between before the 3 meals, and at bedtime.  also check if you have symptoms of your blood sugar being too high or too low.  please keep a record of the readings and bring it to your next appointment here.  You can write it on any piece of paper.  please call us sooner if your blood sugar goes below 70, or if you have a lot of readings over 200.   Please come back for a follow-up appointment in 3 months.   blood and tests are being requested for you today.  We'll contact you with results.

## 2013-12-21 NOTE — Progress Notes (Signed)
Subjective:    Patient ID: Jocelyn Mendez, female    DOB: [birth year], 80 y.o.   MRN: [id number]  HPI  Pt returns for f/u of diabetes mellitus: DM type: Insulin-requiring type 2 Dx'ed: [id number] Complications: polyneuropathy and renal insuff Therapy: insulin since 2012 GDM: never DKA: never Severe hypoglycemia: never Pancreatitis: never Other: she has chosen qd NPH insulin, for the sake of simplicity; she uses pen, due to visual problems; on levemir, she had am hypoglycemia and pm hyperglycemia Interval history: no cbg record, but states cbg's vary from 76-192.  It is in general higher as the day goes on.  pt states she feels well in general. Past Medical History  Diagnosis Date  . Acute bronchitis   . Unspecified essential hypertension   . Chest pain, unspecified   . Type II or unspecified type diabetes mellitus without mention of complication, not stated as uncontrolled   . Other and unspecified hyperlipidemia   . Unspecified hypothyroidism   . Esophageal reflux   . Diverticulosis of colon (without mention of hemorrhage)   . Unspecified disorder resulting from impaired renal function   . Generalized osteoarthrosis, unspecified site   . Lumbago   . Osteoporosis   . Headache(784.0)   . Anxiety state, unspecified   . Anemia of other chronic disease   . Other B-complex deficiencies   . Neuropathy in diabetes     bilat LE's    Past Surgical History  Procedure Laterality Date  . Appendectomy      History   Social History  . Marital Status: Divorced    Spouse Name: N/A    Number of Children: 6  . Years of Education: N/A   Occupational History  . Not on file.   Social History Main Topics  . Smoking status: Former Smoker -- 0.50 packs/day for 5 years    Types: Cigarettes    Quit date: 02/10/1973  . Smokeless tobacco: Former Systems developer    Types: Snuff    Quit date: 04/07/2010  . Alcohol Use: No  . Drug Use: No  . Sexual Activity: No   Other Topics Concern  . Not on  file   Social History Narrative   1 child passed away at 99 weeks old---?crib death    Current Outpatient Prescriptions on File Prior to Visit  Medication Sig Dispense Refill  . albuterol (PROVENTIL HFA;VENTOLIN HFA) 108 (90 BASE) MCG/ACT inhaler Inhale 2 puffs into the lungs every 6 (six) hours as needed for wheezing or shortness of breath. 1 Inhaler 6  . amLODipine (NORVASC) 5 MG tablet TAKE 1 TABLET (5 MG TOTAL) BY MOUTH DAILY. 30 tablet 6  . aspirin 81 MG tablet Take 81 mg by mouth daily.      Marland Kitchen atorvastatin (LIPITOR) 20 MG tablet TAKE 1 TABLET BY MOUTH EVERY DAY 30 tablet 6  . BAYER CONTOUR TEST test strip 1 EACH BY OTHER ROUTE 4 (FOUR) TIMES DAILY AS NEEDED FOR OTHER. AND LANCETS 2/DAT 250.03 100 each 6  . BD PEN NEEDLE NANO U/F 32G X 4 MM MISC USE AS DIRECTED WITH THE HUMULIN PEN INSULIN 100 each 3  . Brinzolamide-Brimonidine (SIMBRINZA) 1-0.2 % SUSP Place 1 drop into both eyes 2 (two) times daily.    . Cholecalciferol (VITAMIN D3) 2000 UNITS TABS Take 1 tablet by mouth 2 (two) times daily.    . cyanocobalamin (,VITAMIN B-12,) 1000 MCG/ML injection INJECT 1 ML (1,000 MCG TOTAL) INTO THE MUSCLE ONCE. 1 mL 0  . fenofibrate 160  MG tablet TAKE 1 TABLET EVERY DAY 90 tablet 1  . ferrous sulfate 325 (65 FE) MG EC tablet Take 325 mg by mouth 2 (two) times daily.     . furosemide (LASIX) 20 MG tablet Take 1 tablet (20 mg total) by mouth daily. 30 tablet 6  . glucose blood test strip 1 each by Other route 4 (four) times daily. Bayer contour test strips    . Insulin Isophane Human (HUMULIN N) 100 UNIT/ML Kiwkpen Inject 120 Units into the skin every morning.     . Insulin Pen Needle 31G X 8 MM MISC 1 each by Does not apply route. Use with insulin pen device, humulin    . Lancets (ONETOUCH ULTRASOFT) lancets Use as instructed 100 each 6  . Lancets 30G MISC 1 each by Does not apply route. Use to check blood sugars four times per day    . levothyroxine (SYNTHROID, LEVOTHROID) 150 MCG tablet TAKE 1  TABLET (150 MCG TOTAL) BY MOUTH DAILY. 30 tablet 11  . NEEDLE, DISP, 25 G (BD DISP NEEDLES) 25G X 5/8" MISC Use as directed to administer  the b12 injection monthly 25 each 0  . ONE TOUCH ULTRA TEST test strip TEST UP TO 4 TIMES A DAY 100 each 6  . ranitidine (ZANTAC) 150 MG capsule Take 1 capsule by mouth at bedtime.    . [DISCONTINUED] insulin glargine (LANTUS SOLOSTAR) 100 UNIT/ML injection Inject 100 Units into the skin every morning. 45 mL PRN   No current facility-administered medications on file prior to visit.    Allergies  Allergen Reactions  . Codeine     REACTION: change in mental status  . Diphenhydramine Hcl     REACTION: itching  . Quinine     REACTION: abnormal sensations in face  . Sulfonamide Derivatives     REACTION: rash    Family History  Problem Relation Age of Onset  . Diabetes Mother     BP 122/60 mmHg  Pulse 73  Temp(Src) 98.7 F (37.1 C) (Oral)  Ht 5\' 6"  (1.676 m)  Wt 172 lb (78.019 kg)  BMI 27.77 kg/m2  SpO2 97%    Review of Systems She denies hypoglycemia and weight change    Objective:   Physical Exam VITAL SIGNS:  See vs page GENERAL: no distress Pulses: dorsalis pedis intact bilat.   Feet: no deformity. normal color and temp. no edema  Skin: no ulcer on the feet.   Neuro: sensation is intact to touch on the feet, but decreased from normal   Lab Results  Component Value Date   CHOL 147 12/21/2013   HDL 24.50* 12/21/2013   LDLCALC 85 03/19/2011   LDLDIRECT 91.4 12/21/2013   TRIG 282.0* 12/21/2013   CHOLHDL 6 12/21/2013   Lab Results  Component Value Date   TSH 6.04* 12/21/2013   Lab Results  Component Value Date   HGBA1C 6.7* 12/21/2013   Lab Results  Component Value Date   CREATININE 1.7* 12/21/2013   BUN 29* 12/21/2013   NA 144 12/21/2013   K 3.8 12/21/2013   CL 112 12/21/2013   CO2 19 12/21/2013       Assessment & Plan:  DM: overcontrolled, given this regimen, which does match insulin to her changing  needs throughout the day Renal failure, stable Suppressed TSH, new, mild. Dyslipidemia: well-controlled   Patient is advised the following: Patient Instructions  check your blood sugar twice a day.  vary the time of day when you check, between  before the 3 meals, and at bedtime.  also check if you have symptoms of your blood sugar being too high or too low.  please keep a record of the readings and bring it to your next appointment here.  You can write it on any piece of paper.  please call us sooner if your blood sugar goes below 70, or if you have a lot of readings over 200.   Please come back for a follow-up appointment in 3 months.   blood and tests are being requested for you today.  We'll contact you with results.      addendum: Please reduce the insulin to 110 units qam

## 2013-12-22 LAB — LDL CHOLESTEROL, DIRECT: Direct LDL: 91.4 mg/dL

## 2014-01-30 ENCOUNTER — Other Ambulatory Visit: Payer: Self-pay | Admitting: Pulmonary Disease

## 2014-02-27 ENCOUNTER — Other Ambulatory Visit: Payer: Self-pay | Admitting: Endocrinology

## 2014-03-10 ENCOUNTER — Other Ambulatory Visit: Payer: Self-pay | Admitting: Pulmonary Disease

## 2014-03-23 ENCOUNTER — Ambulatory Visit: Payer: Medicare Other | Admitting: Internal Medicine

## 2014-03-24 ENCOUNTER — Ambulatory Visit: Payer: Medicare Other | Admitting: Endocrinology

## 2014-03-29 ENCOUNTER — Ambulatory Visit (INDEPENDENT_AMBULATORY_CARE_PROVIDER_SITE_OTHER): Payer: Medicare Other | Admitting: Pulmonary Disease

## 2014-03-29 ENCOUNTER — Encounter: Payer: Self-pay | Admitting: Pulmonary Disease

## 2014-03-29 VITALS — BP 124/62 | HR 67 | Temp 97.6°F | Ht 66.0 in | Wt 168.4 lb

## 2014-03-29 DIAGNOSIS — E538 Deficiency of other specified B group vitamins: Secondary | ICD-10-CM | POA: Diagnosis not present

## 2014-03-29 MED ORDER — CYANOCOBALAMIN 1000 MCG/ML IJ SOLN
1000.0000 ug | Freq: Once | INTRAMUSCULAR | Status: AC
  Administered 2014-03-29: 1000 ug via INTRAMUSCULAR

## 2014-03-29 NOTE — Patient Instructions (Signed)
Today we updated your med list in our EPIC system...    Continue your current medications the same...  Call for any questions...  Let's plan a follow up visit in 66yr, sooner if needed for breathing problems.Marland KitchenMarland Kitchen

## 2014-03-29 NOTE — Progress Notes (Signed)
Subjective:    Patient ID: Jocelyn Mendez, female    DOB: 06/09/1930, 81 y.o.   MRN: [id number]  HPI 81 y/o WF here for a follow up visit... she has mult med problems including:  HBP, Hypercholesterolemia, DM, mild renal insuffic, & multifactorial anemia w/ B12 defic on shots monthly... she also has DJD, LBP, Osteopenia, etc... ~  SEE PREV EPIC NOTES FOR PRIOR DATA >>   ~  May 17, 2012:  13mo ROV & Jocelyn Mendez reports a good interval- feels well w/o new complaints or concerns... We reviewed the following medical problems during today's office visit >>     HxAB> she reports breathing is good w/o recent resp exac & denies much cough, sput, SOB, etc...    HBP on Amlod5, Lasix20mg  prn swelling; BP= 120/66 & she denies CP, palpit, ch in SOB, edema, etc...    Chol controlled on Lip20 & Feno160; FLP today showed TChol 163, TG 240, HDL 27, LDL 101; we discussed better low fat diet, incr exercise & wt reduction...    She saw DrEllison earlier today for f/u DM w/ RI & PN; on NPH insulin taking 110u daily & labs today showed BS= 148, A1c= 9.7 (it was 8.8 in [place]) and DrEllison is in charge of her DM meds...    She remains on Synthroid150; reminded to take it everyday first thing in the AM on empty stomach; TSH = 16.91 and it seems unlikely that 165mcg isn't enough for her, reminded to take it every day & bring all med bottles to every office visit...    She saw Nephrology last 10/13 [facility]) w/ Stage3 CKD; Creat stable ~1.7 & calcGFR ~30; no further suggestions & disch from her clinic; labs today showed Cr= 1.7, stable...    Anxiety> on Alpraz0.25 prn...    She has Anemia of chr dis, Hx low Fe, & B12 defic> w/ Hg in the 10-12 range on Fe-bid & B12 shots monthly; Labs 4/14 showed Hg=11.8, continue same... We reviewed prob list, meds, xrays and labs> see below for updates >>   LABS 4/14:  FLP- Chol ok but TG=240, HDL=27;  Chems- ok x BS=148, Aic=9.7, Cr=1.7;  CBC w/ Hg=11.8;  TSH=16.91;  VitD=34   ~   November 16, 2012:  11mo ROV & Jocelyn Mendez reports stable overall- we reviewed the following medical problems during today's office visit >>     HxAB> she gets freq exac & requires antibiotics etc, but not on regular meds...    HBP> on Amlod5, Lasix2; BP= 138/60 & she denies CP, palpit, ch in SOB, edema, etc...    HxCP> on ASA81 & she needs to be more active, incr exercise program...    CHOL> on Lip20 & Feno160; FLP 10/14 showed TChol 171, TG 245, HDL 31, LDL 102... Rec to continue same + better low fat diet.    DM> on NovolinN per DrEllison- currently 120u Qam & 10upm; Labs 10/14 showed BS=124 & A1c=8.0    Hypothy> on Synthroid150; Labs 10/14 showed TSH=18.39 & DrEllison is aware, pt clinically euthyroid; reminded to take it everyday first thing in the AM on empty stomach.    GI- GERD, Divertics> on Zantac150; she denies abd pain, dysphagia, n/v, d/c, blood seen...    Renal Insiuffic> she has seen [facility] for Nephrology; stable off nephrotoxic meds; Creat~1.6-1.7 range & stable...    DJD, LBP, Osteopenia> on calcium, MVI, Vit D supplement; uses Tylenol prn pain; notes discomfort in hands etc...    Anxiety> prev on  Alpraz0.25 as needed...    Anemia, B12 Defic> on Fe325mg /d & B12 injection monthly; labs reviewed in Epic. We reviewed prob list, meds, xrays and labs> see below for updates >> She received the 2014 Flu vaccine today & a B12 shot...  LABS 101/14:  FLPo on Lip20+Feno160 but TG=245 & needs better low fat diet;  Chems- ok x BS=124 A1c=8.0 Creat=1.6;  CBC- ok w/ Hg=12.1;  TSH=18.39 on 170mcg/d & reminded to take in AM on empty stomach every day!  ~  May 18, 2013:  63mo ROV & post hosp visit> Jocelyn Mendez was hosp 1/21 - 03/09/13 by [facility] w/ an ILI, COPD exac, and resp failure; treated w/ Tamiflu, IV antibiotics=> Levaquin and Pred; states that shes "doing good now" & daugh is happy w/ her progress... She will be moving to [employer] and [place] to be close to one of her daughters...     BP  remains controlled on Amlod5 and prn Lasix20 (hasn't needed she says- not swelling); BP= 130/62 today & she denies CP, palpit, ch in SOB, edema, etc...    Lipids have been fair on combination of Lip20 & Feno160> last FLP 10/14 showed TChol 171, TG 245, HDL 31, LDL 102; we reviewed low fat diet...     She continues to f/u w/ DrEllison for Endocrine/DM> on Humulin N 145u daily at present; last visit was 1/15 and note reviewed; labs showed BS= 200-500 during the hosp and A1c= 7-8 range; labs today showed BS=136, A1c=6.1.Marland KitchenMarland Kitchen     GI is stable on Zantac150 & she denies GI symptoms, doing well...    She saw Nephrology, DrGoldsborough in the past w/ Stage3 CKD, believed to be related to DM & HBP; Creat had been stable in the 1.5 range, bumped up to 2.4 during the 1/15 hosp & back to 1.5 now...    Similarly she was anemic w/ Hg~10 and now improved to 11.8 w/ Fe=89 (24%) We reviewed prob list, meds, xrays and labs> see below for updates >> OK B12 shot today...  CXR 1/15 showed clear lungs, norm heart size, NAD...  PFT 4/15 showed FVC=1.97 (68%), FEV1=1.49 (71%), %1sec=76, mid-flows=78%predicted... c/w mild-mod restrictive disease...  LABS 4/15:  Chems- ok x BS=136, A1c=6.1, Cr=1.5;  CBC- ok w/ Hg=11.8;  Fe=89 (24%sat);  TSH=4.56  ~  September 26, 2013:  32mo ROV recheck- Jocelyn Mendez brought in a DMV form from [place] for completion today (Done & we mailed it into [place])...  She reports that she is doing satis, no new complaints or concerns today;  She continues to f/u w/ DrEllison Q63mo for her DM (on NPH 120u/d) & was last seen 09/20/13 w/ BS=152, A1c=6.9, Cr=1.6;  BP is adeq controlled on Amlod5 & Lasix20, measures 140./66 today 7 she denies CP, palpit, SOB or change in edema;  "I eat a ton of salt" she says & we reviewed again the need to elim sodium from her diet;  Thyroid remains stable & clinically euthyroid on her 156mcg/d dose...  She remains on Fe, VitD & Vit B12 shots...  We reviewed prob list, meds, xrays  and labs> see below for updates >>   ~  March 29, 2014:  25mo ROV & Jocelyn Mendez tells me that she has established w/ [facility]; she still sees DrEllison for Endocrine- DM on insulin (NPH-110u/d), Hypothyroid on Synthroid150, & Osteoporosis (on calcium & Vit D supplements)... She notes occas "spells" w/ "?skin tightening" and sweats- they occur less than every few months and last  3-4 seconds & resolve spont she says, does not seem progressive & she is asked to monitor the freq & severity of these episodes and report any worsening... We reviewed the following medical problems during today's office visit >>     HxAB> she has a hx of freq AB exac in the past but none for >62yr now; on ProairHFA as needed- denies cough, sput, hemoptysis, dyspnea; asked to incr her exercise program...    HBP> on Amlod5, Lasix20; BP= 124/62 & she denies CP, palpit, ch in SOB, edema, etc...    HxCP> on ASA81 & she needs to be more active, incr exercise program...    CHOL> on Lip20 & Feno160; FLP 11/15 by DrEllison showed TChol 147, TG 283, HDL 25, LDL 91... Rec to continue same meds + better low fat diet + incr exercise!    DM> on NovolinN per DrEllison- currently 110u Qam she says; Labs 11/15 showed BS=82 & A1c=6.7    Hypothy> on Synthroid150; Labs 11/15 showed TSH=6.04 & DrEllison is aware, pt clinically euthyroid; reminded to take it everyday first thing in the AM on empty stomach.    GI- GERD, Divertics> on Zantac150; she denies abd pain, dysphagia, n/v, d/c, blood seen...    Renal Insiuffic> she has seen [facility] for Nephrology; stable off nephrotoxic meds; Creat~1.6-1.7 range & stable...    DJD, LBP, Osteopenia> on calcium, MVI, Vit D supplement; uses Tylenol prn pain; notes discomfort in hands etc...    Anxiety> prev on Alpraz0.25 as needed...    Anemia, B12 Defic> on Fe325mg /d & B12 injection monthly; labs reviewed in Epic. We reviewed prob list, meds, xrays and labs> see below for updates >>  PLAN>>  she will continue ProairHFA as needed for wheezing; encouraged to incr exercise program; she still refuses the Flu vaccines; call for any problems...           Problem List:  Hx of ASTHMATIC BRONCHITIS, ACUTE (ICD-466.0) - no recent bronchitic episodes... the increased activity has really helped her... ~  CXR 5/12 showed clear lungs x mild scarring left base & apical pleural thickening... ~  CXR 6/13 showed norm heart size, min streaky atx at basesw/ sl peribronchial thickening c/w bronchitis, otherw clear, NAD... ~  [facility] 1/15 by [facility] w/ ILI, "COPD exac" and resp failure; CXR was clear, she responded to antibiotics and Pred course=> back to baseline... ~  PFT 4/15 showed FVC=1.97 (68%), FEV1=1.49 (71%), %1sec=76, mid-flows=78%predicted... c/w mild-mod restrictive disease ~  2/16: on ProairHFA as needed; she has not had any resp exac & asked to avoid infections, increase exercise program...  HYPERTENSION (ICD-401.9) - prev on Diovan/HCT but this was stopped 4/12 Hosp & now on NORVASC 5mg /d... ~  10/12:  BP= 128/62 & she denies CP, palpit, syncope, SOB, edema... ~  2/13:  BP= 130/70 & she feels well & denies symptoms... ~  4/13:  DrGoldsborough started LASIX 20mg /d as needed for swelling... ~  6/13:  BP= 120/60 & she notes some CP from her cough, but no palpit/ ch in SOB, etc... ~  8/13:  BP= 130/60 & although she c/o left CWP, she denies palpit, ch in SOB, recent edema, etc... ~  12/13: on Amlod5, Lasix20mg  prn swelling; BP= 142/70 & she denies CP, palpit, ch in SOB, edema, etc... ~  4/14:  on Amlod5, Lasix20mg  prn swelling; BP= 120/66 & she denies CP, palpit, ch in SOB, edema, etc... ~  10/14: on Amlod5, Lasix20; BP= 138/60 & she denies CP, palpit, ch in  SOB, edema, etc... ~  4/15: on Amlod5 and prn Lasix20 (hasn't needed she says- not swelling); BP= 130/62 today & she denies CP, palpit, ch in SOB, edema, etc.  Hx of CHEST PAIN (ICD-786.50) - on ASA 81mg /d;  she continues to experience  intermittent sharp left CWP- Rx w/ Tylenol Prn. ~  cath 1988 showed normal coronaries, min MVP... ~  Cards eval 4/12 by DrNishan> mult risk factors, atypCP, EKG w/ NSSTTWA, planned Myoview but not done due to subseq hosp 4/12... ~  8/13:  She is c/o left CWP- sharp, tender in left axillary area, etc; discussed rest, heat, Tylenol (she does not want stronger pain med).  DM (ICD-250.00) - followed by DrEllison> DM w/ Nephropathy & Neuropathy> on HUMULIN N 100u daily; (off Metformin rx since 4/12 hosp)... ~  labs 8/08 showed BS= 113, HgA1c=5.4.Marland KitchenMarland Kitchen On Metform + Glimep. ~  labs 4/09 showed BS= 99, HgA1c= 5.5.Marland KitchenMarland Kitchen Stopped Glimep. ~  labs 4/10 showed BS= 125, A1c= 7.1.Marland Kitchen. rec> better diet, same meds for now. ~  labs 4/11 showed BS= 266, A1c= 9.1.Marland KitchenMarland Kitchen what happened? Add Glimep1mg /d back. ~  labs 6/11 showed BS= 195, A1c= 7.9.Marland KitchenMarland Kitchen incr Glimep to 2mg /d. ~  labs 10/11 showed BS= 175, A1c= 7.9.Marland KitchenMarland Kitchen rec incr Gimep to 4mg /d. ~  Labs in [facility] 5/12 showed BS= 232==>139; A1c=9.7; Metform stopped in hosp, on [place]. ~  Labs 5/12 post hosp showed BS= 255, A1c= 8.6.Marland KitchenMarland Kitchen Referred to Endocrinology. ~  7/12:  BS at home all in the 200s, she doesn't want additional meds, wants to wait for appt w/ DrKerr 7/24. ~  8/12:  She was seen by DrKerr & her started her on Levemir insulin + Onglyza 2.5mg /d ==> his notes & labs are reviewed... ~  10/12: Note from DrKerr> pt on Levemir 80u + Onglyza 2.5mg /d w/ A1c= 9.0.Marland Kitchen. ~  Labs here 2/13 showed FBS= 229, A1c= 11.0 & we will fax to DrKerr for his review... ~  Pt switched to DrEllison in the interval> meds changed to HUMULIN N & now on 100u daily w/ recent A1c=8.9.Marland Kitchen. ~  Labs 8/13 on HumulinN100/d showed BS= 89, A1c= 8.8... We reviewed diet, exercise, & f/u w/ DrEllison. ~  4/14: on NPH insulin taking 110u daily & labs today showed BS= 148, A1c= 9.7 (it was 8.8 in [place]) and DrEllison is in charge of her DM meds. ~  10/14: on NovolinN per DrEllison- currently 120u Qam & 10upm; Labs  10/14 showed BS=124 & A1c=8.0.Marland KitchenMarland Kitchen Med adjustments per DrEllison. ~  4/15: She continues to f/u w/ DrEllison for Endocrine/DM> on Humulin N 145u daily at present; last visit was 1/15 and note reviewed; labs showed BS= 200-500 during the hosp and A1c= 7-8 range; labs today showed BS=136, A1c=6.1  HYPERLIPIDEMIA (ICD-272.4) - on LIPITOR 20mg /d & FENOFIBRATE 160mg /d (ch from Lopid 10/10). ~  FLP 8/08 showed TChol 129, TG 197, HDL 17, LDL 73... despite taking statin & fibrate drugs. ~  FLP 4/09 on Lip20+Tric145 showed TChol 127, TG 269, HDL 13, LDL 69... rec> ch Tricor to [phone]. ~  [place] 10/09 on Lip20+Trilip135 showed TChol 124, TG 247, HDL 10!, LDL 64... rec> ch Trilip to LopidBid. ~  FLP 4/10 on Lip20+LopidBid showed TChol 174, TG 239, HDL 38, LDL 103... rec> ch Lopid to Feno160 (she never did). ~  FLP 10/10 showed TChol 134, TG 256, HDL 10, LDL 76... change the Lopid to Fenofib160/d. ~  FLP 4/11 on Lip20+Feno160 showed TChol 163, TG 234, HDL 28, LDL 108... keep  same, better diet! ~  FLP 10/11 on Lip20+Feno160 showed TChol 172, Tg 304, HDL 26, LDL 112... take meds regularly & better diet. ~  FLP 4/12 on Lip20+Feno160 showed TChol 160, TG 258, HDL 19, LDL 100... Needs better low fat diet. ~  FLP 2/13 on Lip20+Feno160 showed TChol 147, TG 165, HDL 30, LDL 85 ~  FLP 8/13 on Lip20+Feno160 showed TChol 159, TG 232, HDL 30, LDL 99 ~  FLP 4/14 on Lip20+Feno160 showed TChol 163, TG 240, HDL 27, LDL 101 ~  FLP 10/14 on Lip20+Feno160 showed TChol 171, TG 245, HDL 31, LDL 102... Rec to continue same + better low fat diet.   HYPOTHYROIDISM (ICD-244.9) - Hx remote Thyroid surg & scar in neck> on SYNTHROID replacement. ~  last TSH 1/08 = 0.41 ~  labs 4/09 showed TSH= 2.35 ~  labs 10/09 on Levothy125 showed TSH= 0.97 ~  labs 4/10 on Levothy125 showed TSH= 0.08... rec> decr [phone]. ~  labs 10/10 on Levothy112 showed TSH= 2.18... keep same. ~  labs 4/11 on Levo112 showed TSH= 0.51 ~  Labs 4/12 on Levo112  showed TSH= 1.02 ~  Labs 2/13 on Levo112 showed TSH= 20.8.Marland KitchenMarland Kitchen Why? Pharm confirms regular refills, we will incr dose to 176mcg/d. ~  Labs 8/13 on Levo125 showed TSH= 20.2.Marland KitchenMarland Kitchen We decided to incr LEVOTHYROID 127mcg/d... ~  Labs 4/14 on Levo150 showed TSH= 16.91... This dose should be adeq & rec to take 1st thing in AM on empty stomach etc... ~  Labs 10/14 on Levothy150 showed TSH= 18.39... Rec to take in AM on empty stomach & every day... ~  Labs 4/15 on Levothy150 showed TSH= 4.56  GERD (ICD-530.81) - takes NEXIUM 40mg  Prn (states Omep20 "makes me sick")...  ~  EGD was planned in 2006 eval but never done... ~  6/13:  She want to switch to ZANTAC 150mg /d since Nexium is too expensive  DIVERTICULOSIS OF COLON (ICD-562.10) - c/o constip & rec> Miralax + Senakot-S... ~  last colonoscopy 3/06 by DrDBrodie showed divertics only...  RENAL INSUFFICIENCY (ICD-588.9) - prev Creat in the 1.7 - 2.0 range... ~  labs 8/08 showed BUN=26, Creat=1.5 ~  labs 4/09 showed BUN= 34, Creat= 1.5 ~  labs 10/09 showed BUN= 31, Creat= 1.5 ~  labs 4/10 showed BUN= 28, Creat= 1.0, Microalb= pos... continue Rx (on Diovan). ~  labs 10/10 showed BUN= 22, Creat= 1.2 ~  labs 4/11 showed BUN= 20, Creat= 1.5 ~  labs 6/11 showed BUN= 20, Creat= 1.3 ~  labs 10/11 showed BUN= 27, Creat= 1.5 ~  Renal Ultrasound 5/12 was neg- NAD, no hydronephrosis... ~  Labs in [facility] 5/12 showed Creat= 2.11==>1.4 ~  Labs 5/12 post hosp showed BUN=44, Creat=1.7, K=5.7, HCO3=23;  She has appt w/ Nephrology set up by the Hospitalists. ~  6/12 & 10/12:  Pt seen by DrGoldsborough & her notes are reviewed... ~  Labs 2/13 showed BUN= 33, Creat= 1.6 ~  Labs 8/13 showed BUN= 34, Creat= 1.7 ~  10/13: she had f/u DrGoldborough> Cr stable at 1.7, due to HBP + DM, no other rev causes found, released from her clinic... ~  Labs 4/14 showed BUN= 27, Creat= 1.7 ~  Labs 10/14 showed BUN= 26, Cr= 1.6 ~  [facility] 1/15 w/ ILI and resp failure> Cr rose to 2.4 & back to  1.7 by disch... ~  Labs 4/15 showed BUN= 21, Cr= 1.5  DEGENERATIVE JOINT DISEASE, GENERALIZED (ICD-715.00) - she uses Tylenol Prn. LOW BACK PAIN (ICD-724.2) OSTEOPOROSIS (ICD-733.00) -  she takes calcium + vits daily... ~  BMD here 2/06 showed TScores -1.4 Spine,  -1.1 left FemNeck... ~  labs 4/09 showed Vit D level= 24... rec OTC Vit D 1000 u daily, but she stopped this. ~  labs 4/10 showed Vit D level = 20... rec> incr OTC Vit D 2000 u daily (she never did). ~  labs 4/11 showed Vit D level = 22...Marland KitchenMarland KitchenMarland Kitchen rec> incr to 2000 u daily. ~  Labs 4/12 showed Vit D level = 32 ~  Labs 4/14 showed Vit D level = 34, rec to continue supplements...  HEADACHE (ICD-784.0)  ANXIETY (ICD-300.00) - she notes her Bro has emphysema, lung cancer and CHF... she takes ALPRAZOLAM 0.5mg  Prn.  ANEMIA>  Hx ACD & IronDefic>  Rx FeSO4 + VitC ~  Hx of multifactorial anemia w/ Hg=11 in 2005, Fe was OK, Stools neg for blood, Folate norm, B12 found low, & Creat 1.5+ ~  labs 1/08 showed Hg=11.1 ~  labs 8/08 showed Hg=9.9 ~  labs 4/09 showed Hg= 9.6, Fe=102, TIBC=344, Sat=21% ~  labs 10/09 showed Hg= 9.1, MCV= 92 ~  labs 4/10 showed = 11.1, Fe= 71 ~  labs 10/10 showed Hg= 9.3, MCV= 93... rec Fe Bid w/ Vit C... ~  labs 4/11 showed Hg= 11.8, MCV=87, Fe=86 ~  labs 10/11 showed Hg= 10.9, MCV= 89 ~  Labs in [facility] 5/12 showed Hg= 10.3==>8.6, Fe=19 (5%sat), B12=505, Folate=16, SPE= neg. ~  Labs 2/13 showed Hg= 12.0 ~  Labs 4/14 showed Hg= 11.8 ~  Labs 10/14 showed Hg= 12.1 ~  Labs showed Hg ~10 during 1/15 hosp... ~  Labs 4/15 showed Hg= 11.8  VITAMIN B12 DEFICIENCY (ICD-266.2) - Vit B12 level was 77 in 2006 w/ pos parietal cell antibodies; she's been on B12 shots ever since then...  ~  f/u B12 level 4/10 = >1500 ~  f/u B12 level 5/12= 505 ~  She remains on monthly Vit B12 shots...   Past Surgical History  Procedure Laterality Date  . Appendectomy      Outpatient Encounter Prescriptions as of 03/29/2014  Medication  Sig  . albuterol (PROVENTIL HFA;VENTOLIN HFA) 108 (90 BASE) MCG/ACT inhaler Inhale 2 puffs into the lungs every 6 (six) hours as needed for wheezing or shortness of breath.  Marland Kitchen amLODipine (NORVASC) 5 MG tablet TAKE 1 TABLET (5 MG TOTAL) BY MOUTH DAILY.  Marland Kitchen aspirin 81 MG tablet Take 81 mg by mouth daily.    Marland Kitchen atorvastatin (LIPITOR) 20 MG tablet TAKE 1 TABLET BY MOUTH EVERY DAY  . BAYER CONTOUR TEST test strip 1 EACH BY OTHER ROUTE 4 (FOUR) TIMES DAILY AS NEEDED FOR OTHER. AND LANCETS 2/DAT 250.03  . BD PEN NEEDLE NANO U/F 32G X 4 MM MISC USE AS DIRECTED WITH THE HUMULIN PEN INSULIN  . Brinzolamide-Brimonidine (SIMBRINZA) 1-0.2 % SUSP Place 1 drop into both eyes 2 (two) times daily.  . Cholecalciferol (VITAMIN D3) 2000 UNITS TABS Take 1 tablet by mouth 2 (two) times daily.  . cyanocobalamin (,VITAMIN B-12,) 1000 MCG/ML injection INJECT 1 ML (1,000 MCG TOTAL) INTO THE MUSCLE ONCE.  . fenofibrate 160 MG tablet TAKE 1 TABLET EVERY DAY  . ferrous sulfate 325 (65 FE) MG EC tablet Take 325 mg by mouth 2 (two) times daily.   . furosemide (LASIX) 20 MG tablet Take 1 tablet (20 mg total) by mouth daily.  Marland Kitchen glucose blood test strip 1 each by Other route 4 (four) times daily. Bayer contour test strips  . HUMULIN  N KWIKPEN 100 UNIT/ML Kiwkpen INJECT 145 UNITS INTO THE SKIN EVERY MORNING. (Patient taking differently: INJECT 110  UNITS INTO THE SKIN EVERY MORNING.)  . Insulin Pen Needle 31G X 8 MM MISC 1 each by Does not apply route. Use with insulin pen device, humulin  . Lancets (ONETOUCH ULTRASOFT) lancets Use as instructed  . Lancets 30G MISC 1 each by Does not apply route. Use to check blood sugars four times per day  . levothyroxine (SYNTHROID, LEVOTHROID) 150 MCG tablet TAKE 1 TABLET (150 MCG TOTAL) BY MOUTH DAILY.  Marland Kitchen NEEDLE, DISP, 25 G (BD DISP NEEDLES) 25G X 5/8" MISC Use as directed to administer  the b12 injection monthly  . ONE TOUCH ULTRA TEST test strip TEST UP TO 4 TIMES A DAY  . ranitidine  (ZANTAC) 150 MG capsule Take 1 capsule by mouth at bedtime.  . [DISCONTINUED] Insulin Isophane Human (HUMULIN N) 100 UNIT/ML Kiwkpen Inject 120 Units into the skin every morning.   . [EXPIRED] cyanocobalamin ((VITAMIN B-12)) injection 1,000 mcg     Allergies  Allergen Reactions  . Codeine     REACTION: change in mental status  . Diphenhydramine Hcl     REACTION: itching  . Quinine     REACTION: abnormal sensations in face  . Sulfonamide Derivatives     REACTION: rash    Current Medications, Allergies, Past Medical History, Past Surgical History, Family History, and Social History were reviewed in [employer] record.    Review of Systems        See HPI - all other systems neg except as noted... The patient complains of nausea & diarrhea- now resolved.  The patient denies anorexia, fever, weight loss, weight gain, vision loss, decreased hearing, hoarseness, syncope, peripheral edema, prolonged cough, headaches, hemoptysis, abdominal pain, melena, hematochezia, severe indigestion/heartburn, hematuria, incontinence, suspicious skin lesions, transient blindness, depression, unusual weight change, abnormal bleeding, enlarged lymph nodes, and angioedema.     Objective:   Physical Exam      WD, WN, 81 y/o WF in NAD...  VS reviewed & BP= 120/64 w/o postural changes... GENERAL:  Alert & oriented; pleasant & cooperative... HEENT:  [place]/AT, EOM-wnl, PERRLA, EACs-clear, TMs- sl red on left, NOSE-clear, THROAT-clear & wnl. NECK:  Decr ROM; no JVD; normal carotid impulses w/o bruits; +thyroid scar, no thyromegaly or nodules palpated; no lymphadenopathy. CHEST:  Clear to P & A; without wheezes/ rales/ or rhonchi heard; +tender chest wall & pectoralis area. HEART:  Regular Rhythm; without murmurs/ rubs/ or gallops detected... ABDOMEN:  Soft & nontender; normal bowel sounds; no organomegaly or masses palpated... EXT: without deformities, mild arthritic changes; no varicose  veins/ +venous insuffic/ no edema. NEURO:  CN's intact; no focal neuro deficits or weakness found... DERM:  no lesions noted...  RADIOLOGY DATA:  Reviewed in the EPIC EMR & discussed w/ the patient...   LABORATORY DATA:  Reviewed in the EPIC EMR & discussed w/ the patient...   Assessment & Plan:    AB, pulm restriction by PFT>  No recent infectious exac; she knows to get yearly flu vaccine etc...   Hypertension>  Her BP remains controlled on Amlodipine +Lasix20, & specifically her BP is not too low & there are no postural changes;  Continue same med...  Hx of CP>  She was seen 05/13/10 for a routine 77mo check up & daugh mentioned her atypCP & wanted cardiac eval & seen by DrNishan; EKG w/ poor R progression & NSSTTWA....  Diabetes>  Now  followed & managed by DrEllison on HUMULIN N at 110u daily; A1c is 6.7 currently..6 month ROV & review of medical problems... -7 Hyperlipidemia>  On Lipitor & Fenofibrate, needs better low fat diet, continue same meds...  Renal Insuffic>  Prev followed by DrGoldsborough & Creat is stable at 1.6-1.7  Hypothyroid>  TSH suddenly incr to ~20 on 15mcg/d, the dose grad incr to 127mcg/d; TSH now ~6 & clinically stable, encouraged to take the med daily...  Anemia>  Multifactorial, on B12 shots monthly & Fe/ VitC supplementation...  Anxiety>  She stopped her prn Alprazolam...  Infected sebaceous cyst on back> resolved...   Patient's Medications  New Prescriptions   No medications on file  Previous Medications   ALBUTEROL (PROVENTIL HFA;VENTOLIN HFA) 108 (90 BASE) MCG/ACT INHALER    Inhale 2 puffs into the lungs every 6 (six) hours as needed for wheezing or shortness of breath.   AMLODIPINE (NORVASC) 5 MG TABLET    TAKE 1 TABLET (5 MG TOTAL) BY MOUTH DAILY.   ASPIRIN 81 MG TABLET    Take 81 mg by mouth daily.     ATORVASTATIN (LIPITOR) 20 MG TABLET    TAKE 1 TABLET BY MOUTH EVERY DAY   BAYER CONTOUR TEST TEST STRIP    1 EACH BY OTHER ROUTE 4 (FOUR) TIMES  DAILY AS NEEDED FOR OTHER. AND LANCETS 2/DAT 250.03   BD PEN NEEDLE NANO U/F 32G X 4 MM MISC    USE AS DIRECTED WITH THE HUMULIN PEN INSULIN   BRINZOLAMIDE-BRIMONIDINE (SIMBRINZA) 1-0.2 % SUSP    Place 1 drop into both eyes 2 (two) times daily.   CHOLECALCIFEROL (VITAMIN D3) 2000 UNITS TABS    Take 1 tablet by mouth 2 (two) times daily.   CYANOCOBALAMIN (,VITAMIN B-12,) 1000 MCG/ML INJECTION    INJECT 1 ML (1,000 MCG TOTAL) INTO THE MUSCLE ONCE.   FENOFIBRATE 160 MG TABLET    TAKE 1 TABLET EVERY DAY   FERROUS SULFATE 325 (65 FE) MG EC TABLET    Take 325 mg by mouth 2 (two) times daily.    FUROSEMIDE (LASIX) 20 MG TABLET    Take 1 tablet (20 mg total) by mouth daily.   GLUCOSE BLOOD TEST STRIP    1 each by Other route 4 (four) times daily. Bayer contour test strips   HUMULIN N KWIKPEN 100 UNIT/ML KIWKPEN    INJECT 145 UNITS INTO THE SKIN EVERY MORNING.   INSULIN PEN NEEDLE 31G X 8 MM MISC    1 each by Does not apply route. Use with insulin pen device, humulin   LANCETS (ONETOUCH ULTRASOFT) LANCETS    Use as instructed   LANCETS 30G MISC    1 each by Does not apply route. Use to check blood sugars four times per day   LEVOTHYROXINE (SYNTHROID, LEVOTHROID) 150 MCG TABLET    TAKE 1 TABLET (150 MCG TOTAL) BY MOUTH DAILY.   NEEDLE, DISP, 25 G (BD DISP NEEDLES) 25G X 5/8" MISC    Use as directed to administer  the b12 injection monthly   ONE TOUCH ULTRA TEST TEST STRIP    TEST UP TO 4 TIMES A DAY   RANITIDINE (ZANTAC) 150 MG CAPSULE    Take 1 capsule by mouth at bedtime.  Modified Medications   No medications on file  Discontinued Medications   INSULIN ISOPHANE HUMAN (HUMULIN N) 100 UNIT/ML KIWKPEN    Inject 120 Units into the skin every morning.

## 2014-04-06 ENCOUNTER — Other Ambulatory Visit (INDEPENDENT_AMBULATORY_CARE_PROVIDER_SITE_OTHER): Payer: Medicare Other

## 2014-04-06 ENCOUNTER — Encounter: Payer: Self-pay | Admitting: Internal Medicine

## 2014-04-06 ENCOUNTER — Ambulatory Visit (INDEPENDENT_AMBULATORY_CARE_PROVIDER_SITE_OTHER): Payer: Medicare Other | Admitting: Internal Medicine

## 2014-04-06 ENCOUNTER — Other Ambulatory Visit: Payer: Medicare Other

## 2014-04-06 VITALS — BP 150/64 | HR 67 | Temp 98.2°F | Resp 14 | Ht 66.0 in | Wt 171.4 lb

## 2014-04-06 DIAGNOSIS — M810 Age-related osteoporosis without current pathological fracture: Secondary | ICD-10-CM

## 2014-04-06 DIAGNOSIS — E039 Hypothyroidism, unspecified: Secondary | ICD-10-CM

## 2014-04-06 DIAGNOSIS — I10 Essential (primary) hypertension: Secondary | ICD-10-CM

## 2014-04-06 DIAGNOSIS — N183 Chronic kidney disease, stage 3 unspecified: Secondary | ICD-10-CM

## 2014-04-06 DIAGNOSIS — E119 Type 2 diabetes mellitus without complications: Secondary | ICD-10-CM

## 2014-04-06 DIAGNOSIS — E785 Hyperlipidemia, unspecified: Secondary | ICD-10-CM | POA: Diagnosis not present

## 2014-04-06 LAB — TSH: TSH: 17.54 u[IU]/mL — AB (ref 0.35–4.50)

## 2014-04-06 LAB — HEMOGLOBIN A1C: Hgb A1c MFr Bld: 6.9 % — ABNORMAL HIGH (ref 4.6–6.5)

## 2014-04-06 MED ORDER — ALBUTEROL SULFATE HFA 108 (90 BASE) MCG/ACT IN AERS: 2.0000 | INHALATION_SPRAY | Freq: Four times a day (QID) | RESPIRATORY_TRACT | Status: DC | PRN

## 2014-04-06 MED ORDER — ROSUVASTATIN CALCIUM 20 MG PO TABS: 20.0000 mg | ORAL_TABLET | Freq: Every day | ORAL | Status: DC

## 2014-04-06 MED ORDER — LEVOTHYROXINE SODIUM 150 MCG PO TABS: 150.0000 ug | ORAL_TABLET | Freq: Every day | ORAL | Status: DC

## 2014-04-06 MED ORDER — FENOFIBRATE 160 MG PO TABS: ORAL_TABLET | ORAL | Status: DC

## 2014-04-06 MED ORDER — AMLODIPINE BESYLATE 5 MG PO TABS: ORAL_TABLET | ORAL | Status: DC

## 2014-04-06 NOTE — Progress Notes (Signed)
Pre visit review using our clinic review tool, if applicable. No additional management support is needed unless otherwise documented below in the visit note. 

## 2014-04-06 NOTE — Patient Instructions (Signed)
We will have you stop the lipitor when you run out and start taking crestor for the cholesterol instead.   Do not take the fluid pill anymore unless you are not able to get the swelling down with putting your feet up.   We will check your blood work today and that way Dr. Loanne Drilling will not have to do it next week.   We will see you back in about 6 months. If you have any problems or questions before then please feel free to call the office.  Health Maintenance Adopting a healthy lifestyle and getting preventive care can go a long way to promote health and wellness. Talk with your health care provider about what schedule of regular examinations is right for you. This is a good chance for you to check in with your provider about disease prevention and staying healthy. In between checkups, there are plenty of things you can do on your own. Experts have done a lot of research about which lifestyle changes and preventive measures are most likely to keep you healthy. Ask your health care provider for more information. WEIGHT AND DIET  Eat a healthy diet  Be sure to include plenty of vegetables, fruits, low-fat dairy products, and lean protein.  Do not eat a lot of foods high in solid fats, added sugars, or salt.  Get regular exercise. This is one of the most important things you can do for your health.  Most adults should exercise for at least 150 minutes each week. The exercise should increase your heart rate and make you sweat (moderate-intensity exercise).  Most adults should also do strengthening exercises at least twice a week. This is in addition to the moderate-intensity exercise.  Maintain a healthy weight  Body mass index (BMI) is a measurement that can be used to identify possible weight problems. It estimates body fat based on height and weight. Your health care provider can help determine your BMI and help you achieve or maintain a healthy weight.  For females 31 years of age and  older:   A BMI below 18.5 is considered underweight.  A BMI of 18.5 to 24.9 is normal.  A BMI of 25 to 29.9 is considered overweight.  A BMI of 30 and above is considered obese.  Watch levels of cholesterol and blood lipids  You should start having your blood tested for lipids and cholesterol at 79 years of age, then have this test every 5 years.  You may need to have your cholesterol levels checked more often if:  Your lipid or cholesterol levels are high.  You are older than 79 years of age.  You are at high risk for heart disease.  CANCER SCREENING   Lung Cancer  Lung cancer screening is recommended for adults 60-11 years old who are at high risk for lung cancer because of a history of smoking.  A yearly low-dose CT scan of the lungs is recommended for people who:  Currently smoke.  Have quit within the past 15 years.  Have at least a 30-pack-year history of smoking. A pack year is smoking an average of one pack of cigarettes a day for 1 year.  Yearly screening should continue until it has been 15 years since you quit.  Yearly screening should stop if you develop a health problem that would prevent you from having lung cancer treatment.  Breast Cancer  Practice breast self-awareness. This means understanding how your breasts normally appear and feel.  It also means  doing regular breast self-exams. Let your health care provider know about any changes, no matter how small.  If you are in your 20s or 30s, you should have a clinical breast exam (CBE) by a health care provider every 1-3 years as part of a regular health exam.  If you are 42 or older, have a CBE every year. Also consider having a breast X-ray (mammogram) every year.  If you have a family history of breast cancer, talk to your health care provider about genetic screening.  If you are at high risk for breast cancer, talk to your health care provider about having an MRI and a mammogram every  year.  Breast cancer gene (BRCA) assessment is recommended for women who have family members with BRCA-related cancers. BRCA-related cancers include:  Breast.  Ovarian.  Tubal.  Peritoneal cancers.  Results of the assessment will determine the need for genetic counseling and BRCA1 and BRCA2 testing. Cervical Cancer Routine pelvic examinations to screen for cervical cancer are no longer recommended for nonpregnant women who are considered low risk for cancer of the pelvic organs (ovaries, uterus, and vagina) and who do not have symptoms. A pelvic examination may be necessary if you have symptoms including those associated with pelvic infections. Ask your health care provider if a screening pelvic exam is right for you.   The Pap test is the screening test for cervical cancer for women who are considered at risk.  If you had a hysterectomy for a problem that was not cancer or a condition that could lead to cancer, then you no longer need Pap tests.  If you are older than 65 years, and you have had normal Pap tests for the past 10 years, you no longer need to have Pap tests.  If you have had past treatment for cervical cancer or a condition that could lead to cancer, you need Pap tests and screening for cancer for at least 20 years after your treatment.  If you no longer get a Pap test, assess your risk factors if they change (such as having a new sexual partner). This can affect whether you should start being screened again.  Some women have medical problems that increase their chance of getting cervical cancer. If this is the case for you, your health care provider may recommend more frequent screening and Pap tests.  The human papillomavirus (HPV) test is another test that may be used for cervical cancer screening. The HPV test looks for the virus that can cause cell changes in the cervix. The cells collected during the Pap test can be tested for HPV.  The HPV test can be used to screen  women 74 years of age and older. Getting tested for HPV can extend the interval between normal Pap tests from three to five years.  An HPV test also should be used to screen women of any age who have unclear Pap test results.  After 79 years of age, women should have HPV testing as often as Pap tests.  Colorectal Cancer  This type of cancer can be detected and often prevented.  Routine colorectal cancer screening usually begins at 79 years of age and continues through 79 years of age.  Your health care provider may recommend screening at an earlier age if you have risk factors for colon cancer.  Your health care provider may also recommend using home test kits to check for hidden blood in the stool.  A small camera at the end of a  tube can be used to examine your colon directly (sigmoidoscopy or colonoscopy). This is done to check for the earliest forms of colorectal cancer.  Routine screening usually begins at age 72.  Direct examination of the colon should be repeated every 5-10 years through 79 years of age. However, you may need to be screened more often if early forms of precancerous polyps or small growths are found. Skin Cancer  Check your skin from head to toe regularly.  Tell your health care provider about any new moles or changes in moles, especially if there is a change in a mole's shape or color.  Also tell your health care provider if you have a mole that is larger than the size of a pencil eraser.  Always use sunscreen. Apply sunscreen liberally and repeatedly throughout the day.  Protect yourself by wearing long sleeves, pants, a wide-brimmed hat, and sunglasses whenever you are outside. HEART DISEASE, DIABETES, AND HIGH BLOOD PRESSURE   Have your blood pressure checked at least every 1-2 years. High blood pressure causes heart disease and increases the risk of stroke.  If you are between 25 years and 23 years old, ask your health care provider if you should take  aspirin to prevent strokes.  Have regular diabetes screenings. This involves taking a blood sample to check your fasting blood sugar level.  If you are at a normal weight and have a low risk for diabetes, have this test once every three years after 79 years of age.  If you are overweight and have a high risk for diabetes, consider being tested at a younger age or more often. PREVENTING INFECTION  Hepatitis B  If you have a higher risk for hepatitis B, you should be screened for this virus. You are considered at high risk for hepatitis B if:  You were born in a country where hepatitis B is common. Ask your health care provider which countries are considered high risk.  Your parents were born in a high-risk country, and you have not been immunized against hepatitis B (hepatitis B vaccine).  You have HIV or AIDS.  You use needles to inject street drugs.  You live with someone who has hepatitis B.  You have had sex with someone who has hepatitis B.  You get hemodialysis treatment.  You take certain medicines for conditions, including cancer, organ transplantation, and autoimmune conditions. Hepatitis C  Blood testing is recommended for:  Everyone born from [birth year] through 1963.  Anyone with known risk factors for hepatitis C. Sexually transmitted infections (STIs)  You should be screened for sexually transmitted infections (STIs) including gonorrhea and chlamydia if:  You are sexually active and are younger than 79 years of age.  You are older than 79 years of age and your health care provider tells you that you are at risk for this type of infection.  Your sexual activity has changed since you were last screened and you are at an increased risk for chlamydia or gonorrhea. Ask your health care provider if you are at risk.  If you do not have HIV, but are at risk, it may be recommended that you take a prescription medicine daily to prevent HIV infection. This is called  pre-exposure prophylaxis (PrEP). You are considered at risk if:  You are sexually active and do not regularly use condoms or know the HIV status of your partner(s).  You take drugs by injection.  You are sexually active with a partner who has HIV. Talk with  your health care provider about whether you are at high risk of being infected with HIV. If you choose to begin PrEP, you should first be tested for HIV. You should then be tested every 3 months for as long as you are taking PrEP.  PREGNANCY   If you are premenopausal and you may become pregnant, ask your health care provider about preconception counseling.  If you may become pregnant, take 400 to 800 micrograms (mcg) of folic acid every day.  If you want to prevent pregnancy, talk to your health care provider about birth control (contraception). OSTEOPOROSIS AND MENOPAUSE   Osteoporosis is a disease in which the bones lose minerals and strength with aging. This can result in serious bone fractures. Your risk for osteoporosis can be identified using a bone density scan.  If you are 59 years of age or older, or if you are at risk for osteoporosis and fractures, ask your health care provider if you should be screened.  Ask your health care provider whether you should take a calcium or vitamin D supplement to lower your risk for osteoporosis.  Menopause may have certain physical symptoms and risks.  Hormone replacement therapy may reduce some of these symptoms and risks. Talk to your health care provider about whether hormone replacement therapy is right for you.  HOME CARE INSTRUCTIONS   Schedule regular health, dental, and eye exams.  Stay current with your immunizations.   Do not use any tobacco products including cigarettes, chewing tobacco, or electronic cigarettes.  If you are pregnant, do not drink alcohol.  If you are breastfeeding, limit how much and how often you drink alcohol.  Limit alcohol intake to no more than 1  drink per day for nonpregnant women. One drink equals 12 ounces of beer, 5 ounces of wine, or 1 ounces of hard liquor.  Do not use street drugs.  Do not share needles.  Ask your health care provider for help if you need support or information about quitting drugs.  Tell your health care provider if you often feel depressed.  Tell your health care provider if you have ever been abused or do not feel safe at home. Document Released: 08/12/2010 Document Revised: 06/13/2013 Document Reviewed: 12/29/2012 [facility] Patient Information 2015 [place], [place]. This information is not intended to replace advice given to you by your health care provider. Make sure you discuss any questions you have with your health care provider.

## 2014-04-07 ENCOUNTER — Other Ambulatory Visit (INDEPENDENT_AMBULATORY_CARE_PROVIDER_SITE_OTHER): Payer: Medicare Other

## 2014-04-07 ENCOUNTER — Other Ambulatory Visit: Payer: Self-pay | Admitting: Internal Medicine

## 2014-04-07 DIAGNOSIS — E039 Hypothyroidism, unspecified: Secondary | ICD-10-CM

## 2014-04-07 LAB — T4, FREE: Free T4: 0.79 ng/dL (ref 0.60–1.60)

## 2014-04-09 ENCOUNTER — Encounter: Payer: Self-pay | Admitting: Internal Medicine

## 2014-04-09 NOTE — Assessment & Plan Note (Signed)
BP mildly elevated today but last 3 readings from other office visits at goal. If persistently elevated at next visit will adjust therapy. No side effects, stable kidney function.

## 2014-04-09 NOTE — Assessment & Plan Note (Signed)
Last creatinine stable at 1.7 with GFR persistently close to 30. She is not sure if she is seeing nephrology and if not may need this as she is approaching stage 4 CKD. Diabetes is well controlled for now and will work to make sure hypertension is well controlled (today's value higher than typical, usually at goal).

## 2014-04-09 NOTE — Assessment & Plan Note (Signed)
Currently taking 150 mcg daily synthroid without dose adjustment in some time. Last TSH high, so check TSH and free T4 today and adjust dosing as needed.

## 2014-04-09 NOTE — Progress Notes (Signed)
   Subjective:    Patient ID: Jocelyn Mendez, female    DOB: April 05, 1930, 81 y.o.   MRN: [id number]  HPI The patient is an 81 YO female who is coming in today to follow up on her thyroid. She is on thyroid replacement and has been for many years. Her endocrinologist checked the levels last time and they were abnormal. He did not change her dose. She has not needed a dose adjustment in many years. Her previous PCP has been monitoring those levels for some time. She denies any constipation, tremors, weight gain. She denies missing any doses of her medication because she knows how important it is. She also has many other medical conditions that we talk about (See A/P for status and treatment of chronic problems).   PMH, [facility], social history reviewed and updated at today's visit.   Review of Systems  Constitutional: Negative for fever, activity change, appetite change, fatigue and unexpected weight change.  HENT: Negative.   Eyes: Negative.   Respiratory: Negative for cough, chest tightness, shortness of breath and wheezing.   Cardiovascular: Negative for chest pain, palpitations and leg swelling.  Gastrointestinal: Negative for abdominal pain, diarrhea, constipation and abdominal distention.  Endocrine: Negative.   Musculoskeletal: Negative.   Skin: Negative.   Neurological: Negative.   Psychiatric/Behavioral: Negative.       Objective:   Physical Exam  Constitutional: She is oriented to person, place, and time. She appears well-developed and well-nourished.  HENT:  Head: Normocephalic and atraumatic.  Eyes: EOM are normal.  Neck: Normal range of motion.  Cardiovascular: Normal rate and regular rhythm.   Pulmonary/Chest: Effort normal and breath sounds normal.  Abdominal: Soft.  Musculoskeletal: She exhibits no edema.  Neurological: She is alert and oriented to person, place, and time. Coordination normal.  Skin: Skin is warm and dry.  Psychiatric: She has a normal mood and affect.    Filed Vitals:   04/06/14 1107  BP: 150/64  Pulse: 67  Temp: 98.2 F (36.8 C)  TempSrc: Oral  Resp: 14  Height: 5\' 6"  (1.676 m)  Weight: 171 lb 6.4 oz (77.747 kg)  SpO2: 96%      Assessment & Plan:

## 2014-04-09 NOTE — Assessment & Plan Note (Signed)
She is not having any side effects from crestor and fenofibrate at this time. Lipid panel just checked and at goal. Will continue current therapy.

## 2014-04-09 NOTE — Assessment & Plan Note (Signed)
Currently taking vitamin D. Will need to review records of last bone density and if she ever had bisphosphonate therapy.

## 2014-04-18 ENCOUNTER — Encounter: Payer: Self-pay | Admitting: Endocrinology

## 2014-04-18 ENCOUNTER — Ambulatory Visit (INDEPENDENT_AMBULATORY_CARE_PROVIDER_SITE_OTHER): Payer: Medicare Other | Admitting: Endocrinology

## 2014-04-18 DIAGNOSIS — E538 Deficiency of other specified B group vitamins: Secondary | ICD-10-CM | POA: Diagnosis not present

## 2014-04-18 MED ORDER — CYANOCOBALAMIN 1000 MCG/ML IJ SOLN
1000.0000 ug | Freq: Once | INTRAMUSCULAR | Status: AC
  Administered 2014-04-18: 1000 ug via INTRAMUSCULAR

## 2014-04-18 NOTE — Progress Notes (Signed)
Subjective:    Patient ID: Jocelyn Mendez, female    DOB: 11-19-1930, 81 y.o.   MRN: [id number]  HPI Pt returns for f/u of diabetes mellitus: DM type: Insulin-requiring type 2 Dx'ed: 1988.   Complications: polyneuropathy and renal insuff.   Therapy: insulin since 2012.  GDM: never DKA: never Severe hypoglycemia: never Pancreatitis: never.   Other: she has chosen qd NPH insulin, for the sake of simplicity; she uses pen, due to visual problems; on levemir, she had am hypoglycemia and pm hyperglycemia.  Interval history: no cbg record, but pt says she has to eat to avoid hypoglycemia.  Past Medical History  Diagnosis Date  . Acute bronchitis   . Unspecified essential hypertension   . Chest pain, unspecified   . Type II or unspecified type diabetes mellitus without mention of complication, not stated as uncontrolled   . Other and unspecified hyperlipidemia   . Unspecified hypothyroidism   . Esophageal reflux   . Diverticulosis of colon (without mention of hemorrhage)   . Unspecified disorder resulting from impaired renal function   . Generalized osteoarthrosis, unspecified site   . Lumbago   . Osteoporosis   . Headache(784.0)   . Anxiety state, unspecified   . Anemia of other chronic disease   . Other B-complex deficiencies   . Neuropathy in diabetes     bilat LE's    Past Surgical History  Procedure Laterality Date  . Appendectomy      History   Social History  . Marital Status: Divorced    Spouse Name: N/A  . Number of Children: 6  . Years of Education: N/A   Occupational History  . Not on file.   Social History Main Topics  . Smoking status: Former Smoker -- 0.50 packs/day for 5 years    Types: Cigarettes    Quit date: 02/10/1973  . Smokeless tobacco: Former Systems developer    Types: Snuff    Quit date: 04/07/2010  . Alcohol Use: No  . Drug Use: No  . Sexual Activity: No   Other Topics Concern  . Not on file   Social History Narrative   1 child passed away  at 67 weeks old---?crib death    Current Outpatient Prescriptions on File Prior to Visit  Medication Sig Dispense Refill  . albuterol (PROVENTIL HFA;VENTOLIN HFA) 108 (90 BASE) MCG/ACT inhaler Inhale 2 puffs into the lungs every 6 (six) hours as needed for wheezing or shortness of breath. 1 Inhaler 6  . amLODipine (NORVASC) 5 MG tablet TAKE 1 TABLET (5 MG TOTAL) BY MOUTH DAILY. 90 tablet 3  . aspirin 81 MG tablet Take 81 mg by mouth daily.      Marland Kitchen BAYER CONTOUR TEST test strip 1 EACH BY OTHER ROUTE 4 (FOUR) TIMES DAILY AS NEEDED FOR OTHER. AND LANCETS 2/DAT 250.03 100 each 6  . BD PEN NEEDLE NANO U/F 32G X 4 MM MISC USE AS DIRECTED WITH THE HUMULIN PEN INSULIN 100 each 3  . Brinzolamide-Brimonidine (SIMBRINZA) 1-0.2 % SUSP Place 1 drop into both eyes 2 (two) times daily.    . Cholecalciferol (VITAMIN D3) 2000 UNITS TABS Take 1 tablet by mouth 2 (two) times daily.    . cyanocobalamin (,VITAMIN B-12,) 1000 MCG/ML injection INJECT 1 ML (1,000 MCG TOTAL) INTO THE MUSCLE ONCE. 1 mL 0  . fenofibrate 160 MG tablet TAKE 1 TABLET EVERY DAY 90 tablet 3  . ferrous sulfate 325 (65 FE) MG EC tablet Take 325 mg by mouth  2 (two) times daily.     . furosemide (LASIX) 20 MG tablet Take 1 tablet (20 mg total) by mouth daily. 30 tablet 6  . glucose blood test strip 1 each by Other route 4 (four) times daily. Bayer contour test strips    . Insulin Pen Needle 31G X 8 MM MISC 1 each by Does not apply route. Use with insulin pen device, humulin    . Lancets (ONETOUCH ULTRASOFT) lancets Use as instructed 100 each 6  . Lancets 30G MISC 1 each by Does not apply route. Use to check blood sugars four times per day    . levothyroxine (SYNTHROID, LEVOTHROID) 150 MCG tablet Take 1 tablet (150 mcg total) by mouth daily before breakfast. 90 tablet 3  . NEEDLE, DISP, 25 G (BD DISP NEEDLES) 25G X 5/8" MISC Use as directed to administer  the b12 injection monthly 25 each 0  . ONE TOUCH ULTRA TEST test strip TEST UP TO 4 TIMES A DAY  100 each 6  . ranitidine (ZANTAC) 150 MG capsule Take 1 capsule by mouth at bedtime.    . rosuvastatin (CRESTOR) 20 MG tablet Take 1 tablet (20 mg total) by mouth daily. 90 tablet 3  . [DISCONTINUED] insulin glargine (LANTUS SOLOSTAR) 100 UNIT/ML injection Inject 100 Units into the skin every morning. 45 mL PRN   No current facility-administered medications on file prior to visit.    Allergies  Allergen Reactions  . Codeine     REACTION: change in mental status  . Diphenhydramine Hcl     REACTION: itching  . Quinine     REACTION: abnormal sensations in face  . Sulfonamide Derivatives     REACTION: rash    Family History  Problem Relation Age of Onset  . Diabetes Mother     BP 166/67 mmHg  Pulse 75  Temp(Src) 98.9 F (37.2 C) (Oral)  Wt 175 lb 6.4 oz (79.561 kg)  Review of Systems She denies LOC and weight change    Objective:   Physical Exam VITAL SIGNS:  See vs page GENERAL: no distress Pulses: dorsalis pedis intact bilat.   Feet: no deformity. normal color and temp. no edema  Skin: no ulcer on the feet.   Neuro: sensation is intact to touch on the feet, but decreased from normal CV: few varicosities on the feet and ankles  Lab Results  Component Value Date   HGBA1C 6.9* 04/06/2014      Assessment & Plan:  DM: overcontrolled, given this slow-release insulin, which does match insulin to her changing needs throughout the day.  Patient is advised the following: Patient Instructions  check your blood sugar twice a day.  vary the time of day when you check, between before the 3 meals, and at bedtime.  also check if you have symptoms of your blood sugar being too high or too low.  please keep a record of the readings and bring it to your next appointment here.  You can write it on any piece of paper.  please call us sooner if your blood sugar goes below 70, or if you have a lot of readings over 200.   Please come back for a follow-up appointment in 3-4 months.     Please reduce the insulin to 100 units each morning.

## 2014-04-18 NOTE — Patient Instructions (Addendum)
check your blood sugar twice a day.  vary the time of day when you check, between before the 3 meals, and at bedtime.  also check if you have symptoms of your blood sugar being too high or too low.  please keep a record of the readings and bring it to your next appointment here.  You can write it on any piece of paper.  please call us sooner if your blood sugar goes below 70, or if you have a lot of readings over 200.   Please come back for a follow-up appointment in 3-4 months.   Please reduce the insulin to 100 units each morning.

## 2014-05-23 DIAGNOSIS — H4011X3 Primary open-angle glaucoma, severe stage: Secondary | ICD-10-CM | POA: Diagnosis not present

## 2014-05-23 DIAGNOSIS — H26493 Other secondary cataract, bilateral: Secondary | ICD-10-CM | POA: Diagnosis not present

## 2014-06-07 ENCOUNTER — Other Ambulatory Visit: Payer: Self-pay | Admitting: Pulmonary Disease

## 2014-06-07 ENCOUNTER — Other Ambulatory Visit: Payer: Self-pay | Admitting: Endocrinology

## 2014-06-07 ENCOUNTER — Other Ambulatory Visit: Payer: Self-pay | Admitting: Internal Medicine

## 2014-06-19 DIAGNOSIS — H26493 Other secondary cataract, bilateral: Secondary | ICD-10-CM | POA: Diagnosis not present

## 2014-06-19 DIAGNOSIS — H4011X1 Primary open-angle glaucoma, mild stage: Secondary | ICD-10-CM | POA: Diagnosis not present

## 2014-07-19 ENCOUNTER — Ambulatory Visit (INDEPENDENT_AMBULATORY_CARE_PROVIDER_SITE_OTHER): Payer: Medicare Other | Admitting: Endocrinology

## 2014-07-19 VITALS — BP 138/64 | HR 106 | Temp 98.3°F | Ht 66.0 in | Wt 172.0 lb

## 2014-07-19 DIAGNOSIS — E538 Deficiency of other specified B group vitamins: Secondary | ICD-10-CM | POA: Diagnosis not present

## 2014-07-19 DIAGNOSIS — E1065 Type 1 diabetes mellitus with hyperglycemia: Principal | ICD-10-CM

## 2014-07-19 DIAGNOSIS — E890 Postprocedural hypothyroidism: Secondary | ICD-10-CM

## 2014-07-19 DIAGNOSIS — E1029 Type 1 diabetes mellitus with other diabetic kidney complication: Secondary | ICD-10-CM | POA: Diagnosis not present

## 2014-07-19 DIAGNOSIS — IMO0002 Reserved for concepts with insufficient information to code with codable children: Secondary | ICD-10-CM

## 2014-07-19 LAB — BASIC METABOLIC PANEL
BUN: 35 mg/dL — AB (ref 6–23)
CO2: 24 meq/L (ref 19–32)
Calcium: 9.6 mg/dL (ref 8.4–10.5)
Chloride: 109 mEq/L (ref 96–112)
Creatinine, Ser: 1.89 mg/dL — ABNORMAL HIGH (ref 0.40–1.20)
GFR: 27.09 mL/min — AB (ref >60.00)
Glucose, Bld: 66 mg/dL — ABNORMAL LOW (ref 70–99)
Potassium: 4.2 mEq/L (ref 3.5–5.1)
Sodium: 139 mEq/L (ref 135–145)

## 2014-07-19 LAB — HEMOGLOBIN A1C: HEMOGLOBIN A1C: 6.8 % — AB (ref 4.6–6.5)

## 2014-07-19 LAB — TSH: TSH: 11.62 u[IU]/mL — AB (ref 0.35–4.50)

## 2014-07-19 MED ORDER — CYANOCOBALAMIN 1000 MCG/ML IJ SOLN
1000.0000 ug | Freq: Once | INTRAMUSCULAR | Status: AC
  Administered 2014-07-19: 1000 ug via INTRAMUSCULAR

## 2014-07-19 NOTE — Patient Instructions (Addendum)
check your blood sugar twice a day.  vary the time of day when you check, between before the 3 meals, and at bedtime.  also check if you have symptoms of your blood sugar being too high or too low.  please keep a record of the readings and bring it to your next appointment here.  You can write it on any piece of paper.  please call us sooner if your blood sugar goes below 70, or if you have a lot of readings over 200.   blood tests are requested for you today.  We'll let you know about the results.   Please come back for a follow-up appointment in 3-4 months.

## 2014-07-19 NOTE — Progress Notes (Signed)
Subjective:    Patient ID: Jocelyn Mendez, female    DOB: 1930-10-06, 81 y.o.   MRN: [id number]  HPI Pt returns for f/u of diabetes mellitus: DM type: Insulin-requiring type 2 Dx'ed: 1988.   Complications: polyneuropathy and renal insuff.   Therapy: insulin since 2012.  GDM: never DKA: never Severe hypoglycemia: never Pancreatitis: never.   Other: she has chosen qd NPH insulin, for the sake of simplicity; she uses pen, due to visual problems; on levemir, she had am hypoglycemia and pm hyperglycemia.  Interval history: no cbg record, but she says cbg's vary from 78-203.  It is in general higher as the day goes on.   Past Medical History  Diagnosis Date  . Acute bronchitis   . Unspecified essential hypertension   . Chest pain, unspecified   . Type II or unspecified type diabetes mellitus without mention of complication, not stated as uncontrolled   . Other and unspecified hyperlipidemia   . Unspecified hypothyroidism   . Esophageal reflux   . Diverticulosis of colon (without mention of hemorrhage)   . Unspecified disorder resulting from impaired renal function   . Generalized osteoarthrosis, unspecified site   . Lumbago   . Osteoporosis   . Headache(784.0)   . Anxiety state, unspecified   . Anemia of other chronic disease   . Other B-complex deficiencies   . Neuropathy in diabetes     bilat LE's    Past Surgical History  Procedure Laterality Date  . Appendectomy      History   Social History  . Marital Status: Divorced    Spouse Name: N/A  . Number of Children: 6  . Years of Education: N/A   Occupational History  . Not on file.   Social History Main Topics  . Smoking status: Former Smoker -- 0.50 packs/day for 5 years    Types: Cigarettes    Quit date: 02/10/1973  . Smokeless tobacco: Former Systems developer    Types: Snuff    Quit date: 04/07/2010  . Alcohol Use: No  . Drug Use: No  . Sexual Activity: No   Other Topics Concern  . Not on file   Social History  Narrative   1 child passed away at 61 weeks old---?crib death    Current Outpatient Prescriptions on File Prior to Visit  Medication Sig Dispense Refill  . albuterol (PROVENTIL HFA;VENTOLIN HFA) 108 (90 BASE) MCG/ACT inhaler Inhale 2 puffs into the lungs every 6 (six) hours as needed for wheezing or shortness of breath. 1 Inhaler 6  . amLODipine (NORVASC) 5 MG tablet TAKE 1 TABLET (5 MG TOTAL) BY MOUTH DAILY. 90 tablet 3  . aspirin 81 MG tablet Take 81 mg by mouth daily.      Marland Kitchen BAYER CONTOUR TEST test strip 1 EACH BY OTHER ROUTE 4 (FOUR) TIMES DAILY AS NEEDED FOR OTHER. AND LANCETS 2/DAT 250.03 100 each 6  . BD PEN NEEDLE NANO U/F 32G X 4 MM MISC USE AS DIRECTED WITH THE HUMULIN PEN INSULIN 100 each 3  . Brinzolamide-Brimonidine (SIMBRINZA) 1-0.2 % SUSP Place 1 drop into both eyes 2 (two) times daily.    . Cholecalciferol (VITAMIN D3) 2000 UNITS TABS Take 1 tablet by mouth 2 (two) times daily.    . cyanocobalamin (,VITAMIN B-12,) 1000 MCG/ML injection INJECT 1 ML (1,000 MCG TOTAL) INTO THE MUSCLE ONCE. 1 mL 0  . fenofibrate 160 MG tablet TAKE 1 TABLET EVERY DAY 90 tablet 3  . ferrous sulfate 325 (65  FE) MG EC tablet Take 325 mg by mouth 2 (two) times daily.     . furosemide (LASIX) 20 MG tablet TAKE 1 TABLET BY MOUTH DAILY 30 tablet 1  . glucose blood test strip 1 each by Other route 4 (four) times daily. Bayer contour test strips    . Insulin NPH, Human,, Isophane, (HUMULIN N) 100 UNIT/ML Kiwkpen Inject 90 Units into the skin every morning.     . Insulin Pen Needle 31G X 8 MM MISC 1 each by Does not apply route. Use with insulin pen device, humulin    . Lancets (ONETOUCH ULTRASOFT) lancets Use as instructed 100 each 6  . Lancets 30G MISC 1 each by Does not apply route. Use to check blood sugars four times per day    . levothyroxine (SYNTHROID, LEVOTHROID) 150 MCG tablet Take 1 tablet (150 mcg total) by mouth daily before breakfast. 90 tablet 3  . NEEDLE, DISP, 25 G (BD DISP NEEDLES) 25G X 5/8"  MISC Use as directed to administer  the b12 injection monthly 25 each 0  . ONE TOUCH ULTRA TEST test strip TEST UP TO FOUR TIMES DAILY 100 each 0  . ranitidine (ZANTAC) 150 MG capsule Take 1 capsule by mouth at bedtime.    . rosuvastatin (CRESTOR) 20 MG tablet Take 1 tablet (20 mg total) by mouth daily. 90 tablet 3  . [DISCONTINUED] insulin glargine (LANTUS SOLOSTAR) 100 UNIT/ML injection Inject 100 Units into the skin every morning. 45 mL PRN   No current facility-administered medications on file prior to visit.    Allergies  Allergen Reactions  . Codeine     REACTION: change in mental status  . Diphenhydramine Hcl     REACTION: itching  . Quinine     REACTION: abnormal sensations in face  . Sulfonamide Derivatives     REACTION: rash    Family History  Problem Relation Age of Onset  . Diabetes Mother     BP 138/64 mmHg  Pulse 106  Temp(Src) 98.3 F (36.8 C) (Oral)  Ht 5\' 6"  (1.676 m)  Wt 172 lb (78.019 kg)  BMI 27.77 kg/m2  SpO2 96%   Review of Systems Denies weight change    Objective:   Physical Exam VITAL SIGNS:  See vs page GENERAL: no distress Pulses: dorsalis pedis intact bilat.   MSK: no deformity of the feet CV: no leg edema Skin:  no ulcer on the feet.  normal color and temp on the feet. Neuro: sensation is intact to touch on the feet. Ext: There is bilateral onychomycosis of the toenails.   Lab Results  Component Value Date   HGBA1C 6.8* 07/19/2014   Lab Results  Component Value Date   CREATININE 1.89* 07/19/2014   BUN 35* 07/19/2014   NA 139 07/19/2014   K 4.2 07/19/2014   CL 109 07/19/2014   CO2 24 07/19/2014      Assessment & Plan:  DM: overcontrolled, given this regimen, which does match insulin to her changing needs throughout the day  Renal failure, stable: plan is to follow. Hypothyroidism: she needs increased rx.  Patient is advised the following: Patient Instructions  check your blood sugar twice a day.  vary the time of day  when you check, between before the 3 meals, and at bedtime.  also check if you have symptoms of your blood sugar being too high or too low.  please keep a record of the readings and bring it to your next appointment here.  You can write it on any piece of paper.  please call us sooner if your blood sugar goes below 70, or if you have a lot of readings over 200.   blood tests are requested for you today.  We'll let you know about the results.   Please come back for a follow-up appointment in 3-4 months.     addendum: reduce insulin to 90 units qam.   Increase synthroid to 175/d.

## 2014-07-20 ENCOUNTER — Telehealth: Payer: Self-pay | Admitting: Endocrinology

## 2014-07-20 MED ORDER — LEVOTHYROXINE SODIUM 175 MCG PO TABS: 175.0000 ug | ORAL_TABLET | Freq: Every day | ORAL | Status: DC

## 2014-07-20 NOTE — Telephone Encounter (Signed)
please call patient: i forgot to also mention: Your blood test also said we need to increase the thyroid pill.  i have sent a prescription to your pharmacy. You kidneys are the same.

## 2014-07-20 NOTE — Telephone Encounter (Signed)
Called pt and lvm advising her per Dr Cordelia Pen message below.

## 2014-08-03 ENCOUNTER — Other Ambulatory Visit: Payer: Self-pay | Admitting: Internal Medicine

## 2014-08-31 ENCOUNTER — Other Ambulatory Visit: Payer: Self-pay | Admitting: Endocrinology

## 2014-10-02 ENCOUNTER — Other Ambulatory Visit: Payer: Self-pay | Admitting: Endocrinology

## 2014-10-03 ENCOUNTER — Other Ambulatory Visit: Payer: Self-pay

## 2014-10-03 MED ORDER — GLUCOSE BLOOD VI STRP: ORAL_STRIP | Status: DC

## 2014-10-05 ENCOUNTER — Ambulatory Visit (INDEPENDENT_AMBULATORY_CARE_PROVIDER_SITE_OTHER): Payer: Medicare Other | Admitting: Internal Medicine

## 2014-10-05 ENCOUNTER — Encounter: Payer: Self-pay | Admitting: Internal Medicine

## 2014-10-05 VITALS — BP 150/70 | HR 79 | Temp 97.8°F | Resp 16 | Ht 66.0 in | Wt 169.2 lb

## 2014-10-05 DIAGNOSIS — I10 Essential (primary) hypertension: Secondary | ICD-10-CM

## 2014-10-05 DIAGNOSIS — E538 Deficiency of other specified B group vitamins: Secondary | ICD-10-CM | POA: Diagnosis not present

## 2014-10-05 DIAGNOSIS — Z0000 Encounter for general adult medical examination without abnormal findings: Secondary | ICD-10-CM | POA: Diagnosis not present

## 2014-10-05 DIAGNOSIS — E785 Hyperlipidemia, unspecified: Secondary | ICD-10-CM | POA: Diagnosis not present

## 2014-10-05 DIAGNOSIS — Z23 Encounter for immunization: Secondary | ICD-10-CM | POA: Diagnosis not present

## 2014-10-05 MED ORDER — "NEEDLE (DISP) 25G X 5/8"" MISC": Status: DC

## 2014-10-05 MED ORDER — CYANOCOBALAMIN 1000 MCG/ML IJ SOLN
1000.0000 ug | Freq: Once | INTRAMUSCULAR | Status: AC
  Administered 2014-10-05: 1000 ug via INTRAMUSCULAR

## 2014-10-05 MED ORDER — CYANOCOBALAMIN 1000 MCG/ML IJ SOLN: 1000.0000 ug | INTRAMUSCULAR | Status: DC

## 2014-10-05 NOTE — Assessment & Plan Note (Signed)
BP controlled with amlodipine and lasix. Checking labs today and adjust as needed. Complicated by CKD stage 3.

## 2014-10-05 NOTE — Progress Notes (Signed)
Pre visit review using our clinic review tool, if applicable. No additional management support is needed unless otherwise documented below in the visit note. 

## 2014-10-05 NOTE — Progress Notes (Signed)
Subjective:   Jocelyn Mendez is a 81 y.o. female who presents for Medicare Annual (Subsequent) preventive examination.  Review of Systems:    HRA assessment completed during visit;  Patient is here for Annual Wellness Assessment prior to CPE with Dr. Doug Sou Problem list review for risk HTN; DJD; hyperlipidemia Lipids 12/2013 choe 147; Trig 282; HDL 24; LDL 91.4/ ratio 6 A1c 6.8 Describes health is good most of the time  BMI: 27.3 Diet; Eats 2 eggs in am; 2 slices of bacon; sausage; toast Dinner sandwich; pimento cheese;  Supper pork chops and cream potatoes. Meat and vegetables Exercise; Maintains exercise; walks to dtr'; do chair exercises on porch almost every day;  Each exercise x 3 minutes; legs and arms; does twist about 5 minutes  Family Hx  Social history: son lives with her in her home; one level home; lives in community; good neighbors and dtr lives across the road;  Falls 4 yo running from bee; states she does fear falling and tendency to stagger; (doctor hardening of the arteries in the back neck) Does stand a minute or so before taking off;  BP sitting 150/70; standing 156/70 Safety  Psychosocial support; safe community; firearm safety; smoke alarms;  Caregiver to other?   Discussed Goal to improve health based on risk  Screenings overdue Dexa scan; will schedule dexa scan; Ophthalmology exam; cataract surgery; glaucoma and takes drops q night; Dr. Arnoldo Morale at the eye center; fup q 6 months Tetanus/ had before retirement / discussed getting this updated;  Prevnar/ discussed and will take  Flu; is due today; Colonoscopy had in march 2006; diverticulosis / aged out Shingles; covered under part d __________________________  EKG 03/03/2013 Mammogram 07/2011/ found area in right breast; but u/s neg;  Hearing: has been tested but can't afford hearing aid Dental: Need dental work; top teeth pulled and bottom teeth need to come out. Also recommended DSS to review  for Medicaid and Low subsidy program for part d   Gave information on safety to take home;    Objective:     Vitals: BP 150/70 mmHg  Pulse 79  Temp(Src) 97.8 F (36.6 C) (Oral)  Resp 16  Ht 5\' 6"  (1.676 m)  Wt 169 lb 4 oz (76.771 kg)  BMI 27.33 kg/m2  SpO2 98%  Tobacco History  Smoking status  . Former Smoker -- 0.50 packs/day for 5 years  . Types: Cigarettes  . Quit date: 02/10/1973  Smokeless tobacco  . Former Systems developer  . Types: Snuff  . Quit date: 04/07/2010     Counseling given: Yes   Past Medical History  Diagnosis Date  . Acute bronchitis   . Unspecified essential hypertension   . Chest pain, unspecified   . Type II or unspecified type diabetes mellitus without mention of complication, not stated as uncontrolled   . Other and unspecified hyperlipidemia   . Unspecified hypothyroidism   . Esophageal reflux   . Diverticulosis of colon (without mention of hemorrhage)   . Unspecified disorder resulting from impaired renal function   . Generalized osteoarthrosis, unspecified site   . Lumbago   . Osteoporosis   . Headache(784.0)   . Anxiety state, unspecified   . Anemia of other chronic disease   . Other B-complex deficiencies   . Neuropathy in diabetes     bilat LE's   Past Surgical History  Procedure Laterality Date  . Appendectomy     Family History  Problem Relation Age of Onset  . Diabetes  Mother    History  Sexual Activity  . Sexual Activity: No    Outpatient Encounter Prescriptions as of 10/05/2014  Medication Sig  . albuterol (PROVENTIL HFA;VENTOLIN HFA) 108 (90 BASE) MCG/ACT inhaler Inhale 2 puffs into the lungs every 6 (six) hours as needed for wheezing or shortness of breath.  Marland Kitchen amLODipine (NORVASC) 5 MG tablet TAKE 1 TABLET (5 MG TOTAL) BY MOUTH DAILY.  Marland Kitchen aspirin 81 MG tablet Take 81 mg by mouth daily.    . BD PEN NEEDLE NANO U/F 32G X 4 MM MISC USE AS DIRECTED WITH THE HUMULIN PEN INSULIN  . Brinzolamide-Brimonidine (SIMBRINZA) 1-0.2 %  SUSP Place 1 drop into both eyes 2 (two) times daily.  . Cholecalciferol (VITAMIN D3) 2000 UNITS TABS Take 1 tablet by mouth 2 (two) times daily.  . cyanocobalamin (,VITAMIN B-12,) 1000 MCG/ML injection INJECT 1 ML (1,000 MCG TOTAL) INTO THE MUSCLE ONCE.  . fenofibrate 160 MG tablet TAKE 1 TABLET EVERY DAY  . ferrous sulfate 325 (65 FE) MG EC tablet Take 325 mg by mouth 2 (two) times daily.   Marland Kitchen glucose blood (BAYER CONTOUR TEST) test strip 1 EACH BY OTHER ROUTE 4 (FOUR) TIMES DAILY AS NEEDED FOR OTHER. AND LANCETS 2/DAT 250.03  . glucose blood test strip 1 each by Other route 4 (four) times daily. Bayer contour test strips  . Insulin NPH, Human,, Isophane, (HUMULIN N) 100 UNIT/ML Kiwkpen Inject 90 Units into the skin every morning.   . Insulin Pen Needle 31G X 8 MM MISC 1 each by Does not apply route. Use with insulin pen device, humulin  . Lancets (ONETOUCH ULTRASOFT) lancets Use as instructed  . Lancets 30G MISC 1 each by Does not apply route. Use to check blood sugars four times per day  . levothyroxine (SYNTHROID, LEVOTHROID) 175 MCG tablet Take 1 tablet (175 mcg total) by mouth daily before breakfast.  . NEEDLE, DISP, 25 G (BD DISP NEEDLES) 25G X 5/8" MISC Use as directed to administer  the b12 injection monthly  . ONE TOUCH ULTRA TEST test strip USE TO TEST BLOOD GLUCOSE FOUR TIMES DAILY  . rosuvastatin (CRESTOR) 20 MG tablet Take 1 tablet (20 mg total) by mouth daily.  . furosemide (LASIX) 20 MG tablet TAKE 1 TABLET BY MOUTH DAILY (Patient not taking: Reported on 10/05/2014)  . ranitidine (ZANTAC) 150 MG capsule Take 1 capsule by mouth at bedtime.   No facility-administered encounter medications on file as of 10/05/2014.    Activities of Daily Living In your present state of health, do you have any difficulty performing the following activities: 10/05/2014  Hearing? (No Data)  Vision? N  Difficulty concentrating or making decisions? N  Walking or climbing stairs? N  Dressing or  bathing? N  Doing errands, shopping? N  Using the Toilet? Y  In the past six months, have you accidently leaked urine? Y  Do you have problems with loss of bowel control? N  Managing your Medications? N  Managing your Finances? N  Housekeeping or managing your Housekeeping? N    Patient Care Team: Olga Millers, MD as PCP - General (Internal Medicine)    Assessment:    Objective:   The goal of the wellness visit is to assist the patient how to close the gaps in care and create a preventative care plan for the patient.  Personalized Education was given regarding weight bearing exercise and vaccinations  Bone density scan as appropriate/ to discuss with [facility] Calcium and Vit  D as appropriate/ Osteoporosis risk reviewed  Taking meds without issues; no barriers identified  Labs were and fup visit noted with MD if labs are due to be re-drawn.  Stress: Recommendations for managing stress if assessed as a factor;   No Risk for hepatitis or high risk social behavior identified via hepatitis screen  Educated on shingles and follow up with insurance company for co-pays or charges applied to Part D benefit. Educated on Vaccines;  Safety issues reviewed;  Cognition assessed by AD8; Score 0 MMSE deferred as the patient stated they had no memory issues; No identified risk were noted; The patient was oriented x 3; appropriate in dress and manner and no objective failures at ADL's or IADL's.   Depression screen negative;   Functional status reviewed; no losses in function x 1 year  Bowel and bladder issues assessed  End of life planning was discussed; aging in home or other; plans to complete HCPOA were discussed if not completed    Exercise Activities and Dietary recommendations    Goals    . stay independent     Maintains exercise; walks to dtr'; do chair exercises on porch almost every day;  Each exercise x 3 minutes; legs and arms; Does the twist x 5 min        Fall Risk Fall Risk  10/05/2014 09/26/2013 05/17/2012  Falls in the past year? No No Yes  Number falls in past yr: - - 1  Injury with Fall? - - No   Depression Screen PHQ 2/9 Scores 10/05/2014 09/26/2013 05/17/2012  PHQ - 2 Score 0 0 0     Cognitive Testing No flowsheet data found.  Immunization History  Administered Date(s) Administered  . Influenza Split 10/16/2011  . Influenza Whole 01/21/2007, 11/23/2007, 11/22/2008, 11/12/2009  . Influenza,inj,Quad PF,36+ Mos 11/16/2012  . Pneumococcal Polysaccharide-23 02/10/2009   Screening Tests Health Maintenance  Topic Date Due  . DEXA SCAN  Apr [birth year], 1930  . OPHTHALMOLOGY EXAM  12/08/1942  . ZOSTAVAX  12/07/1992  . PNA vac Low Risk Adult (2 of 2 - PCV13) 02/10/2010  . COLONOSCOPY  05/04/2014  . URINE MICROALBUMIN  09/21/2014  . INFLUENZA VACCINE  04/07/2015 (Originally 09/11/2014)  . TETANUS/TDAP  10/05/2015 (Originally 12/08/1951)  . HEMOGLOBIN A1C  01/18/2015  . FOOT EXAM  07/19/2015      Plan:   To discuss Dexa and Mammogram with Dr. Doug Sou To take Prevnar and High does flu shot today During the course of the visit the patient was educated and counseled about the following appropriate screening and preventive services:   Vaccines to include Pneumoccal, Influenza, Hepatitis B, Td, Zostavax, HCV  Electrocardiogram / 02/2013  Cardiovascular Disease/ BMI good; Nutrition good  Colorectal cancer screening/ aged out  Bone density screening/ deferred to Dr. Doug Sou  Diabetes screening/ good control with BS  AM 90 to 99 / pm 150 to 159  Glaucoma screening/ just dx and treating q 6 months   Mammography/PAP deferred to Dr. Doug Sou  Nutrition counseling   Patient Instructions (the written plan) was given to the patient.   Wynetta Fines, RN  10/05/2014

## 2014-10-05 NOTE — Assessment & Plan Note (Signed)
Checking lipid panel today and adjust as needed. On crestor without side effects and fenofibrate.

## 2014-10-05 NOTE — Progress Notes (Signed)
   Subjective:    Patient ID: Jocelyn Mendez, female    DOB: September 02, 1930, 81 y.o.   MRN: [id number]  HPI The patient is an 81 YO female coming in for follow up of her blood pressure (well controlled on amlodipine, lasix, complicated by CKD stage 3) and her cholesterol (well controlled on crestor, no side effects), and her B12 deficiency (needs injection and has run out at home, has not taken her monthly shot in several months, decreased energy levels). No new concerns.   Review of Systems  Constitutional: Positive for fatigue. Negative for fever, activity change, appetite change and unexpected weight change.  HENT: Negative.   Eyes: Negative.   Respiratory: Negative for cough, chest tightness, shortness of breath and wheezing.   Cardiovascular: Negative for chest pain, palpitations and leg swelling.  Gastrointestinal: Negative for abdominal pain, diarrhea, constipation and abdominal distention.  Endocrine: Negative.   Musculoskeletal: Negative.   Skin: Negative.   Neurological: Negative.   Psychiatric/Behavioral: Negative.       Objective:   Physical Exam  Constitutional: She is oriented to person, place, and time. She appears well-developed and well-nourished.  HENT:  Head: Normocephalic and atraumatic.  Eyes: EOM are normal.  Neck: Normal range of motion.  Cardiovascular: Normal rate and regular rhythm.   Pulmonary/Chest: Effort normal and breath sounds normal.  Abdominal: Soft.  Musculoskeletal: She exhibits no edema.  Neurological: She is alert and oriented to person, place, and time. Coordination normal.  Skin: Skin is warm and dry.  Psychiatric: She has a normal mood and affect.   Filed Vitals:   10/05/14 1013 10/05/14 1051  BP: 150/70 150/70  Pulse: 79   Temp: 97.8 F (36.6 C)   TempSrc: Oral   Resp: 16   Height: 5\' 6"  (1.676 m)   Weight: 169 lb 4 oz (76.771 kg)   SpO2: 98%       Assessment & Plan:  Flu and Prevnar given at visit. B12 shot given at visit.

## 2014-10-05 NOTE — Patient Instructions (Addendum)
Jocelyn Mendez , Thank you for taking time to come for your Medicare Wellness Visit. I appreciate your ongoing commitment to your health goals. Please review the following plan we discussed and let me know if I can assist you in the future.   [email or web address] for resources in the community  [email or web address]  Deaf & Hard of Hearing Division Services  No reviews  [facility] Office  [phone]  [phone]  Call the [employer] to inquire about the dental program in [place] [facility]  [address]  [place], [place] [address]  Phone [phone]   Important [place] does not protect everyone, so some people who get the vaccine may still get Shingles.  You should not get ZOSTAVAX if you are allergic to any of its ingredients, including gelatin or neomycin, have a weakened immune system, take high doses of steroids, or are pregnant or plan to become pregnant. You should not get ZOSTAVAX to prevent chickenpox.  Talk to your health care professional if you plan to get ZOSTAVAX at the same time as PNEUMOVAX23 (Pneumococcal Vaccine Polyvalent) because it may be better to get these vaccines at least 4 weeks apart.  Possible side effects include redness, pain, itching, swelling, hard lump, warmth, or bruising at the injection site, as well as headache.  ZOSTAVAX contains a weakened chickenpox virus. Tell your health care professional if you will be in close contact with newborn infants, someone who may be pregnant and has not had chickenpox or been vaccinated against chickenpox, or someone who has problems with their immune system. Your health care professional can tell you what situations you may need to avoid. You are encouraged to report negative side effects of  prescription drugs to the FDA. Visit [employer], or call 1-800-FDA-1088.    These are the goals we discussed: Goals    . stay independent     Maintains exercise; walks to dtr'; do chair exercises on porch almost every day;  Each exercise x 3 minutes; legs and arms; Does the twist x 5 min        This is a list of the screening recommended for you and due dates:  Health Maintenance  Topic Date Due  . DEXA scan (bone density measurement)  02/26/32  . Eye exam for diabetics  12/08/1942  . Shingles Vaccine  12/07/1992  . Pneumonia vaccines (2 of 2 - PCV13) 02/10/2010  . Colon Cancer Screening  05/04/2014  . Urine Protein Check  09/21/2014  . Flu Shot  04/07/2015*  . Tetanus Vaccine  10/05/2015*  . Hemoglobin A1C  01/18/2015  . Complete foot exam   07/19/2015  *Topic was postponed. The date shown is not the original due date.     Health Maintenance Adopting a healthy lifestyle and getting preventive care can go a long way to promote health and wellness. Talk with your health care provider about what schedule of regular examinations is right for you. This is a good chance for you to check in with your provider about disease prevention and staying healthy. In between checkups, there are plenty of things you can do on your own. Experts have done a lot of research about which lifestyle changes and preventive measures are most likely to keep you healthy. Ask your health care provider for more information. WEIGHT AND DIET  Eat a healthy diet  Be sure to include plenty of vegetables, fruits, low-fat dairy products, and lean protein.  Do not eat a lot of foods high in solid fats, added sugars, or salt.  Get regular exercise. This is one of the most important things you can do for your health.  Most adults should exercise for at least 150 minutes each week. The exercise should increase your heart rate and make you sweat (moderate-intensity exercise).  Most adults should also  do strengthening exercises at least twice a week. This is in addition to the moderate-intensity exercise.  Maintain a healthy weight  Body mass index (BMI) is a measurement that can be used to identify possible weight problems. It estimates body fat based on height and weight. Your health care provider can help determine your BMI and help you achieve or maintain a healthy weight.  For females 43 years of age and older:   A BMI below 18.5 is considered underweight.  A BMI of 18.5 to 24.9 is normal.  A BMI of 25 to 29.9 is considered overweight.  A BMI of 30 and above is considered obese.  Watch levels of cholesterol and blood lipids  You should start having your blood tested for lipids and cholesterol at 79 years of age, then have this test every 5 years.  You may need to have your cholesterol levels checked more often if:  Your lipid or cholesterol levels are high.  You are older than 79 years of age.  You are at high risk for heart disease.  CANCER SCREENING   Lung Cancer  Lung cancer screening is recommended for adults 69-75 years old who are at high risk for lung cancer because of a history of smoking.  A yearly low-dose CT scan of the lungs is recommended for people who:  Currently smoke.  Have quit within the past 15 years.  Have at least a 30-pack-year history of smoking. A pack year is smoking an average of one pack of cigarettes a day for 1 year.  Yearly screening should continue until it has been 15 years since you quit.  Yearly screening should stop if you develop a health problem that would prevent you from having lung cancer treatment.  Breast Cancer  Practice breast self-awareness. This means understanding how your breasts normally appear and feel.  It also means doing regular breast self-exams. Let your health care provider know about any changes, no matter how small.  If you are in your 20s or 30s, you should have a clinical breast exam (CBE) by a  health care provider every 1-3 years as part of a regular health exam.  If you are 21 or older, have a CBE every year. Also consider having a breast X-ray (mammogram) every year.  If you have a family history of breast cancer, talk to your health care provider about genetic screening.  If you are at high risk for breast cancer, talk to your health care provider about having an MRI and a mammogram every year.  Breast cancer gene (BRCA) assessment is recommended for women who have family members with BRCA-related cancers. BRCA-related cancers include:  Breast.  Ovarian.  Tubal.  Peritoneal cancers.  Results of the assessment will determine the need for genetic counseling and BRCA1 and BRCA2 testing. Cervical Cancer Routine pelvic examinations to screen for cervical cancer are no longer recommended for nonpregnant women who are considered low risk for cancer of the pelvic organs (ovaries, uterus, and vagina) and who do not have symptoms. A  pelvic examination may be necessary if you have symptoms including those associated with pelvic infections. Ask your health care provider if a screening pelvic exam is right for you.   The Pap test is the screening test for cervical cancer for women who are considered at risk.  If you had a hysterectomy for a problem that was not cancer or a condition that could lead to cancer, then you no longer need Pap tests.  If you are older than 65 years, and you have had normal Pap tests for the past 10 years, you no longer need to have Pap tests.  If you have had past treatment for cervical cancer or a condition that could lead to cancer, you need Pap tests and screening for cancer for at least 20 years after your treatment.  If you no longer get a Pap test, assess your risk factors if they change (such as having a new sexual partner). This can affect whether you should start being screened again.  Some women have medical problems that increase their chance of  getting cervical cancer. If this is the case for you, your health care provider may recommend more frequent screening and Pap tests.  The human papillomavirus (HPV) test is another test that may be used for cervical cancer screening. The HPV test looks for the virus that can cause cell changes in the cervix. The cells collected during the Pap test can be tested for HPV.  The HPV test can be used to screen women 49 years of age and older. Getting tested for HPV can extend the interval between normal Pap tests from three to five years.  An HPV test also should be used to screen women of any age who have unclear Pap test results.  After 79 years of age, women should have HPV testing as often as Pap tests.  Colorectal Cancer  This type of cancer can be detected and often prevented.  Routine colorectal cancer screening usually begins at 79 years of age and continues through 79 years of age.  Your health care provider may recommend screening at an earlier age if you have risk factors for colon cancer.  Your health care provider may also recommend using home test kits to check for hidden blood in the stool.  A small camera at the end of a tube can be used to examine your colon directly (sigmoidoscopy or colonoscopy). This is done to check for the earliest forms of colorectal cancer.  Routine screening usually begins at age 26.  Direct examination of the colon should be repeated every 5-10 years through 79 years of age. However, you may need to be screened more often if early forms of precancerous polyps or small growths are found. Skin Cancer  Check your skin from head to toe regularly.  Tell your health care provider about any new moles or changes in moles, especially if there is a change in a mole's shape or color.  Also tell your health care provider if you have a mole that is larger than the size of a pencil eraser.  Always use sunscreen. Apply sunscreen liberally and repeatedly  throughout the day.  Protect yourself by wearing long sleeves, pants, a wide-brimmed hat, and sunglasses whenever you are outside. HEART DISEASE, DIABETES, AND HIGH BLOOD PRESSURE   Have your blood pressure checked at least every 1-2 years. High blood pressure causes heart disease and increases the risk of stroke.  If you are between 36 years and 77 years old,  ask your health care provider if you should take aspirin to prevent strokes.  Have regular diabetes screenings. This involves taking a blood sample to check your fasting blood sugar level.  If you are at a normal weight and have a low risk for diabetes, have this test once every three years after 79 years of age.  If you are overweight and have a high risk for diabetes, consider being tested at a younger age or more often. PREVENTING INFECTION  Hepatitis B  If you have a higher risk for hepatitis B, you should be screened for this virus. You are considered at high risk for hepatitis B if:  You were born in a country where hepatitis B is common. Ask your health care provider which countries are considered high risk.  Your parents were born in a high-risk country, and you have not been immunized against hepatitis B (hepatitis B vaccine).  You have HIV or AIDS.  You use needles to inject street drugs.  You live with someone who has hepatitis B.  You have had sex with someone who has hepatitis B.  You get hemodialysis treatment.  You take certain medicines for conditions, including cancer, organ transplantation, and autoimmune conditions. Hepatitis C  Blood testing is recommended for:  Everyone born from [birth year] through 1963.  Anyone with known risk factors for hepatitis C. Sexually transmitted infections (STIs)  You should be screened for sexually transmitted infections (STIs) including gonorrhea and chlamydia if:  You are sexually active and are younger than 79 years of age.  You are older than 79 years of age and your  health care provider tells you that you are at risk for this type of infection.  Your sexual activity has changed since you were last screened and you are at an increased risk for chlamydia or gonorrhea. Ask your health care provider if you are at risk.  If you do not have HIV, but are at risk, it may be recommended that you take a prescription medicine daily to prevent HIV infection. This is called pre-exposure prophylaxis (PrEP). You are considered at risk if:  You are sexually active and do not regularly use condoms or know the HIV status of your partner(s).  You take drugs by injection.  You are sexually active with a partner who has HIV. Talk with your health care provider about whether you are at high risk of being infected with HIV. If you choose to begin PrEP, you should first be tested for HIV. You should then be tested every 3 months for as long as you are taking PrEP.  PREGNANCY   If you are premenopausal and you may become pregnant, ask your health care provider about preconception counseling.  If you may become pregnant, take 400 to 800 micrograms (mcg) of folic acid every day.  If you want to prevent pregnancy, talk to your health care provider about birth control (contraception). OSTEOPOROSIS AND MENOPAUSE   Osteoporosis is a disease in which the bones lose minerals and strength with aging. This can result in serious bone fractures. Your risk for osteoporosis can be identified using a bone density scan.  If you are 90 years of age or older, or if you are at risk for osteoporosis and fractures, ask your health care provider if you should be screened.  Ask your health care provider whether you should take a calcium or vitamin D supplement to lower your risk for osteoporosis.  Menopause may have certain physical symptoms and risks.  Hormone replacement therapy may reduce some of these symptoms and risks. Talk to your health care provider about whether hormone replacement  therapy is right for you.  HOME CARE INSTRUCTIONS   Schedule regular health, dental, and eye exams.  Stay current with your immunizations.   Do not use any tobacco products including cigarettes, chewing tobacco, or electronic cigarettes.  If you are pregnant, do not drink alcohol.  If you are breastfeeding, limit how much and how often you drink alcohol.  Limit alcohol intake to no more than 1 drink per day for nonpregnant women. One drink equals 12 ounces of beer, 5 ounces of wine, or 1 ounces of hard liquor.  Do not use street drugs.  Do not share needles.  Ask your health care provider for help if you need support or information about quitting drugs.  Tell your health care provider if you often feel depressed.  Tell your health care provider if you have ever been abused or do not feel safe at home. Document Released: 08/12/2010 Document Revised: 06/13/2013 Document Reviewed: 12/29/2012 [facility] Patient Information 2015 [place], [place]. This information is not intended to replace advice given to you by your health care provider. Make sure you discuss any questions you have with your health care provider.

## 2014-10-05 NOTE — Progress Notes (Signed)
Medical screening examination/treatment/procedure(s) were performed by non-physician practitioner and as supervising physician I was immediately available for consultation/collaboration. I agree with above. Kollar, Elizabeth A, MD   

## 2014-10-05 NOTE — Assessment & Plan Note (Signed)
B12 shot given today and rx sent in for her monthly injections and the needles to give herself at home. Has been on for many years and has missed several recently.

## 2014-11-07 ENCOUNTER — Other Ambulatory Visit: Payer: Self-pay | Admitting: Endocrinology

## 2014-11-17 ENCOUNTER — Ambulatory Visit: Payer: Medicare Other | Admitting: Endocrinology

## 2014-11-21 ENCOUNTER — Ambulatory Visit: Payer: Medicare Other | Admitting: Endocrinology

## 2014-11-22 ENCOUNTER — Ambulatory Visit (INDEPENDENT_AMBULATORY_CARE_PROVIDER_SITE_OTHER): Payer: Medicare Other | Admitting: Endocrinology

## 2014-11-22 ENCOUNTER — Encounter: Payer: Self-pay | Admitting: Endocrinology

## 2014-11-22 VITALS — BP 146/78 | HR 86 | Temp 98.1°F | Ht 66.0 in | Wt 171.0 lb

## 2014-11-22 DIAGNOSIS — E1065 Type 1 diabetes mellitus with hyperglycemia: Secondary | ICD-10-CM | POA: Diagnosis not present

## 2014-11-22 DIAGNOSIS — N182 Chronic kidney disease, stage 2 (mild): Secondary | ICD-10-CM

## 2014-11-22 DIAGNOSIS — E1022 Type 1 diabetes mellitus with diabetic chronic kidney disease: Secondary | ICD-10-CM

## 2014-11-22 DIAGNOSIS — E785 Hyperlipidemia, unspecified: Secondary | ICD-10-CM

## 2014-11-22 DIAGNOSIS — IMO0002 Reserved for concepts with insufficient information to code with codable children: Secondary | ICD-10-CM

## 2014-11-22 LAB — BASIC METABOLIC PANEL
BUN: 21 mg/dL (ref 6–23)
CALCIUM: 9.7 mg/dL (ref 8.4–10.5)
CO2: 27 meq/L (ref 19–32)
CREATININE: 1.68 mg/dL — AB (ref 0.40–1.20)
Chloride: 106 mEq/L (ref 96–112)
GFR: 31.01 mL/min — AB (ref >60.00)
Glucose, Bld: 68 mg/dL — ABNORMAL LOW (ref 70–99)
Potassium: 3.8 mEq/L (ref 3.5–5.1)
Sodium: 141 mEq/L (ref 135–145)

## 2014-11-22 LAB — LIPID PANEL
Cholesterol: 137 mg/dL (ref 0–200)
HDL: 29.2 mg/dL — ABNORMAL LOW (ref >39.00)
NonHDL: 107.51
Total CHOL/HDL Ratio: 5
Triglycerides: 345 mg/dL — ABNORMAL HIGH (ref 0.0–149.0)
VLDL: 69 mg/dL — ABNORMAL HIGH (ref 0.0–40.0)

## 2014-11-22 LAB — POCT GLYCOSYLATED HEMOGLOBIN (HGB A1C): HEMOGLOBIN A1C: 6.6

## 2014-11-22 LAB — TSH: TSH: 3.05 u[IU]/mL (ref 0.35–4.50)

## 2014-11-22 MED ORDER — INSULIN ISOPHANE HUMAN 100 UNIT/ML KWIKPEN: 80.0000 [IU] | PEN_INJECTOR | SUBCUTANEOUS | Status: DC

## 2014-11-22 NOTE — Progress Notes (Signed)
Subjective:    Patient ID: Jocelyn Mendez, female    DOB: [birth year], 81 y.o.   MRN: [id number]  HPI Pt returns for f/u of diabetes mellitus: DM type: Insulin-requiring type 2 Dx'ed: 1988.   Complications: polyneuropathy and renal insuff.   Therapy: insulin since 2012.  GDM: never DKA: never Severe hypoglycemia: never.   Pancreatitis: never.   Other: she has chosen qd NPH insulin, for the sake of simplicity; she uses pen, due to visual problems; on levemir, she had am hypoglycemia and pm hyperglycemia.  Interval history: no cbg record, but she says cbg's vary from 40-200.  It is in general higher as the day goes on.  She seldom has hypoglycemia, and these episodes are mild.   Past Medical History  Diagnosis Date  . Acute bronchitis   . Unspecified essential hypertension   . Chest pain, unspecified   . Type II or unspecified type diabetes mellitus without mention of complication, not stated as uncontrolled   . Other and unspecified hyperlipidemia   . Unspecified hypothyroidism   . Esophageal reflux   . Diverticulosis of colon (without mention of hemorrhage)   . Unspecified disorder resulting from impaired renal function   . Generalized osteoarthrosis, unspecified site   . Lumbago   . Osteoporosis   . Headache(784.0)   . Anxiety state, unspecified   . Anemia of other chronic disease   . Other B-complex deficiencies   . Neuropathy in diabetes ([facility])     bilat LE's    Past Surgical History  Procedure Laterality Date  . Appendectomy      Social History   Social History  . Marital Status: Divorced    Spouse Name: N/A  . Number of Children: 6  . Years of Education: N/A   Occupational History  . Not on file.   Social History Main Topics  . Smoking status: Former Smoker -- 0.50 packs/day for 5 years    Types: Cigarettes    Quit date: 02/10/1973  . Smokeless tobacco: Former Systems developer    Types: Snuff    Quit date: 04/07/2010  . Alcohol Use: No  . Drug Use: No  .  Sexual Activity: No   Other Topics Concern  . Not on file   Social History Narrative   1 child passed away at 28 weeks old---?crib death   Dtr; [name]; Son lives with her [name]; [name]; roger;        Current Outpatient Prescriptions on File Prior to Visit  Medication Sig Dispense Refill  . albuterol (PROVENTIL HFA;VENTOLIN HFA) 108 (90 BASE) MCG/ACT inhaler Inhale 2 puffs into the lungs every 6 (six) hours as needed for wheezing or shortness of breath. 1 Inhaler 6  . amLODipine (NORVASC) 5 MG tablet TAKE 1 TABLET (5 MG TOTAL) BY MOUTH DAILY. 90 tablet 3  . aspirin 81 MG tablet Take 81 mg by mouth daily.      . BD PEN NEEDLE NANO U/F 32G X 4 MM MISC USE AS DIRECTED WITH THE HUMULIN PEN INSULIN 100 each 3  . Brinzolamide-Brimonidine (SIMBRINZA) 1-0.2 % SUSP Place 1 drop into both eyes 2 (two) times daily.    . Cholecalciferol (VITAMIN D3) 2000 UNITS TABS Take 1 tablet by mouth 2 (two) times daily.    . cyanocobalamin (,VITAMIN B-12,) 1000 MCG/ML injection Inject 1 mL (1,000 mcg total) into the muscle every 30 (thirty) days. 1 mL 11  . fenofibrate 160 MG tablet TAKE 1 TABLET EVERY DAY  90 tablet 3  . ferrous sulfate 325 (65 FE) MG EC tablet Take 325 mg by mouth 2 (two) times daily.     Marland Kitchen glucose blood (BAYER CONTOUR TEST) test strip 1 EACH BY OTHER ROUTE 4 (FOUR) TIMES DAILY AS NEEDED FOR OTHER. AND LANCETS 2/DAT 250.03 400 each 2  . glucose blood test strip 1 each by Other route 4 (four) times daily. Bayer contour test strips    . Insulin Pen Needle 31G X 8 MM MISC 1 each by Does not apply route. Use with insulin pen device, humulin    . Lancets (ONETOUCH ULTRASOFT) lancets Use as instructed 100 each 6  . Lancets 30G MISC 1 each by Does not apply route. Use to check blood sugars four times per day    . levothyroxine (SYNTHROID, LEVOTHROID) 175 MCG tablet Take 1 tablet (175 mcg total) by mouth daily before breakfast. 30 tablet 11  . NEEDLE, DISP, 25 G (BD DISP NEEDLES) 25G X  5/8" MISC Use as directed to administer  the b12 injection monthly 50 each 2  . ONE TOUCH ULTRA TEST test strip USE TO TEST BLOOD GLUCOSE FOUR TIMES DAILY 100 each 0  . ranitidine (ZANTAC) 150 MG capsule Take 1 capsule by mouth at bedtime.    . rosuvastatin (CRESTOR) 20 MG tablet Take 1 tablet (20 mg total) by mouth daily. 90 tablet 3  . furosemide (LASIX) 20 MG tablet TAKE 1 TABLET BY MOUTH DAILY (Patient not taking: Reported on 10/05/2014) 30 tablet 1  . [DISCONTINUED] insulin glargine (LANTUS SOLOSTAR) 100 UNIT/ML injection Inject 100 Units into the skin every morning. 45 mL PRN   No current facility-administered medications on file prior to visit.    Allergies  Allergen Reactions  . Codeine     REACTION: change in mental status  . Diphenhydramine Hcl     REACTION: itching  . Quinine     REACTION: abnormal sensations in face  . Sulfonamide Derivatives     REACTION: rash    Family History  Problem Relation Age of Onset  . Diabetes Mother     BP 146/78 mmHg  Pulse 86  Temp(Src) 98.1 F (36.7 C) (Oral)  Ht 5\' 6"  (1.676 m)  Wt 171 lb (77.565 kg)  BMI 27.61 kg/m2  SpO2 94%    Review of Systems Denies LOC    Objective:   Physical Exam VITAL SIGNS:  See vs page GENERAL: no distress Pulses: dorsalis pedis intact bilat.   MSK: no deformity of the feet CV: no leg edema Skin:  no ulcer on the feet.  normal color and temp on the feet. Neuro: sensation is intact to touch on the feet.      Lab Results  Component Value Date   HGBA1C 6.6 11/22/2014      Assessment & Plan:  DM: overcontrolled, given this regimen, which does match insulin to her changing needs throughout the day  Patient is advised the following: Patient Instructions  check your blood sugar twice a day.  vary the time of day when you check, between before the 3 meals, and at bedtime.  also check if you have symptoms of your blood sugar being too high or too low.  please keep a record of the readings and  bring it to your next appointment here.  You can write it on any piece of paper.  please call us sooner if your blood sugar goes below 70, or if you have a lot of readings over 200.  Please come back for a follow-up appointment in 4 months.   Please reduce the insulin to 80 units each morning.

## 2014-11-22 NOTE — Patient Instructions (Addendum)
check your blood sugar twice a day.  vary the time of day when you check, between before the 3 meals, and at bedtime.  also check if you have symptoms of your blood sugar being too high or too low.  please keep a record of the readings and bring it to your next appointment here.  You can write it on any piece of paper.  please call us sooner if your blood sugar goes below 70, or if you have a lot of readings over 200.   Please come back for a follow-up appointment in 4 months.   Please reduce the insulin to 80 units each morning.

## 2014-11-23 LAB — LDL CHOLESTEROL, DIRECT: LDL DIRECT: 72 mg/dL

## 2014-12-04 ENCOUNTER — Other Ambulatory Visit: Payer: Self-pay | Admitting: Endocrinology

## 2014-12-13 DIAGNOSIS — Z0279 Encounter for issue of other medical certificate: Secondary | ICD-10-CM

## 2015-01-12 ENCOUNTER — Telehealth: Payer: Self-pay | Admitting: Endocrinology

## 2015-01-12 MED ORDER — GLUCOSE BLOOD VI STRP: ORAL_STRIP | Status: DC

## 2015-01-12 NOTE — Telephone Encounter (Signed)
Rx submitted per pt's request.  

## 2015-01-12 NOTE — Telephone Encounter (Signed)
Patient need a new prescription ONE TOUCH ULTRA TEST test strip,  send to  [facility], [place] [phone] (Phone) [phone] (Fax)

## 2015-01-17 DIAGNOSIS — E119 Type 2 diabetes mellitus without complications: Secondary | ICD-10-CM | POA: Diagnosis not present

## 2015-01-17 DIAGNOSIS — H401131 Primary open-angle glaucoma, bilateral, mild stage: Secondary | ICD-10-CM | POA: Diagnosis not present

## 2015-01-17 LAB — HM DIABETES EYE EXAM

## 2015-02-06 ENCOUNTER — Telehealth: Payer: Self-pay | Admitting: Internal Medicine

## 2015-02-06 ENCOUNTER — Emergency Department (INDEPENDENT_AMBULATORY_CARE_PROVIDER_SITE_OTHER): Payer: Medicare Other

## 2015-02-06 ENCOUNTER — Encounter (HOSPITAL_COMMUNITY): Payer: Self-pay

## 2015-02-06 ENCOUNTER — Emergency Department (HOSPITAL_COMMUNITY)
Admission: EM | Admit: 2015-02-06 | Discharge: 2015-02-06 | Disposition: A | Discharge status: Home / Self Care | Payer: Medicare Other | Source: Home / Self Care | Attending: Emergency Medicine | Admitting: Emergency Medicine

## 2015-02-06 DIAGNOSIS — R05 Cough: Secondary | ICD-10-CM | POA: Diagnosis not present

## 2015-02-06 DIAGNOSIS — J018 Other acute sinusitis: Secondary | ICD-10-CM

## 2015-02-06 MED ORDER — AZITHROMYCIN 250 MG PO TABS
ORAL_TABLET | ORAL | Status: DC
Start: 2015-02-06 — End: 2015-03-27

## 2015-02-06 MED ORDER — PREDNISONE 50 MG PO TABS: ORAL_TABLET | ORAL | Status: DC

## 2015-02-06 NOTE — Discharge Instructions (Signed)
You have a sinus infection. Take prednisone daily for 5 days. Take the azithromycin as prescribed. You should start to feel better in the next 2-3 days. Follow-up as needed.

## 2015-02-06 NOTE — ED Provider Notes (Signed)
CSN: [id number]     Arrival date & time 02/06/15  1715 History   First MD Initiated Contact with Patient 02/06/15 1848     Chief Complaint  Patient presents with  . URI   (Consider location/radiation/quality/duration/timing/severity/associated sxs/prior Treatment) HPI  She is an 81 year old woman here for evaluation of head congestion and cough. She states her symptoms started 4-5 days ago with head congestion, nasal congestion, rhinorrhea, sneezing, and cough. She reports subjective fevers. She denies any vomiting. She does report feeling short of breath at times.  Past Medical History  Diagnosis Date  . Acute bronchitis   . Unspecified essential hypertension   . Chest pain, unspecified   . Type II or unspecified type diabetes mellitus without mention of complication, not stated as uncontrolled   . Other and unspecified hyperlipidemia   . Unspecified hypothyroidism   . Esophageal reflux   . Diverticulosis of colon (without mention of hemorrhage)   . Unspecified disorder resulting from impaired renal function   . Generalized osteoarthrosis, unspecified site   . Lumbago   . Osteoporosis   . Headache(784.0)   . Anxiety state, unspecified   . Anemia of other chronic disease   . Other B-complex deficiencies   . Neuropathy in diabetes ([place])     bilat LE's   Past Surgical History  Procedure Laterality Date  . Appendectomy     Family History  Problem Relation Age of Onset  . Diabetes Mother    Social History  Substance Use Topics  . Smoking status: Former Smoker -- 0.50 packs/day for 5 years    Types: Cigarettes    Quit date: 02/10/1973  . Smokeless tobacco: Former Systems developer    Types: Snuff    Quit date: 04/07/2010  . Alcohol Use: No   OB History    No data available     Review of Systems As in history of present illness Allergies  Codeine; Diphenhydramine hcl; Quinine; and Sulfonamide derivatives  Home Medications   Prior to Admission medications   Medication Sig  Start Date End Date Taking? Authorizing Provider  albuterol (PROVENTIL HFA;VENTOLIN HFA) 108 (90 BASE) MCG/ACT inhaler Inhale 2 puffs into the lungs every 6 (six) hours as needed for wheezing or shortness of breath. 04/06/14   Hoyt Koch, MD  amLODipine (NORVASC) 5 MG tablet TAKE 1 TABLET (5 MG TOTAL) BY MOUTH DAILY. 04/06/14   Hoyt Koch, MD  aspirin 81 MG tablet Take 81 mg by mouth daily.      Historical Provider, MD  azithromycin (ZITHROMAX Z-PAK) 250 MG tablet Take 2 pills today, then 1 pill daily until gone. 02/06/15   Melony Overly, MD  BD PEN NEEDLE NANO U/F 32G X 4 MM MISC USE AS DIRECTED WITH THE HUMULIN PEN INSULIN 03/21/13   Renato Shin, MD  Brinzolamide-Brimonidine [facility]) 1-0.2 % SUSP Place 1 drop into both eyes 2 (two) times daily.    Historical Provider, MD  Cholecalciferol (VITAMIN D3) 2000 UNITS TABS Take 1 tablet by mouth 2 (two) times daily.    Historical Provider, MD  cyanocobalamin (,VITAMIN B-12,) 1000 MCG/ML injection Inject 1 mL (1,000 mcg total) into the muscle every 30 (thirty) days. 10/05/14   Hoyt Koch, MD  fenofibrate 160 MG tablet TAKE 1 TABLET EVERY DAY 04/06/14   Hoyt Koch, MD  ferrous sulfate 325 (65 FE) MG EC tablet Take 325 mg by mouth 2 (two) times daily.     Historical Provider, MD  furosemide (LASIX) 20 MG  tablet TAKE 1 TABLET BY MOUTH DAILY Patient not taking: Reported on 10/05/2014 08/03/14   Hoyt Koch, MD  glucose blood (ONE TOUCH ULTRA TEST) test strip USE TO TEST BLOOD GLUCOSE FOUR TIMES DAILY 01/12/15   Renato Shin, MD  Insulin NPH, Human,, Isophane, (HUMULIN N) 100 UNIT/ML Kiwkpen Inject 80 Units into the skin every morning. 11/22/14   Renato Shin, MD  Insulin Pen Needle 31G X 8 MM MISC 1 each by Does not apply route. Use with insulin pen device, humulin    Historical Provider, MD  Lancets (ONETOUCH ULTRASOFT) lancets Use as instructed 02/15/13   Noralee Space, MD  Lancets 30G MISC 1 each by Does not apply  route. Use to check blood sugars four times per day    Historical Provider, MD  levothyroxine (SYNTHROID, LEVOTHROID) 175 MCG tablet Take 1 tablet (175 mcg total) by mouth daily before breakfast. 07/20/14   Renato Shin, MD  NEEDLE, DISP, 25 G (BD DISP NEEDLES) 25G X 5/8" MISC Use as directed to administer  the b12 injection monthly 10/05/14   Hoyt Koch, MD  predniSONE (DELTASONE) 50 MG tablet Take 1 pill daily for 5 days. 02/06/15   Melony Overly, MD  ranitidine (ZANTAC) 150 MG capsule Take 1 capsule by mouth at bedtime. 03/13/13   Historical Provider, MD  rosuvastatin (CRESTOR) 20 MG tablet Take 1 tablet (20 mg total) by mouth daily. 04/06/14   Hoyt Koch, MD   Meds Ordered and Administered this Visit  Medications - No data to display  BP 153/63 mmHg  Pulse 81  Temp(Src) 98.1 F (36.7 C) (Oral)  Resp 20  SpO2 97% No data found.   Physical Exam  Constitutional: She is oriented to person, place, and time. She appears well-developed and well-nourished. No distress.  HENT:  Mouth/Throat: No oropharyngeal exudate.  Mild oropharyngeal erythema. Nasal discharge is present. Nasal mucosa is erythematous.  Neck: Neck supple.  Cardiovascular: Normal rate, regular rhythm and normal heart sounds.   No murmur heard. Pulmonary/Chest: Effort normal. No respiratory distress. She has wheezes ( and left lung base). She has no rales.  Lymphadenopathy:    She has cervical adenopathy.  Neurological: She is alert and oriented to person, place, and time.    ED Course  Procedures (including critical care time)  Labs Review Labs Reviewed - No data to display  Imaging Review Dg Chest 2 View  02/06/2015  CLINICAL DATA:  Cough for several days EXAM: CHEST - 2 VIEW COMPARISON:  03/07/2013 FINDINGS: The heart size and mediastinal contours are within normal limits. Both lungs are clear. The visualized skeletal structures are unremarkable. IMPRESSION: No active disease. Electronically Signed    By: Inez Catalina M.D.   On: 02/06/2015 19:20     MDM   1. Other acute sinusitis    X-rays negative. She is satting well at 97% on room air. Will treat for sinusitis with azithromycin and prednisone. Return precautions reviewed.    Melony Overly, MD 02/06/15 [phone]

## 2015-02-06 NOTE — ED Notes (Signed)
Patient complains of cough congestion and runny nose that started about two  Days before christmas When she coughs  She coughs up white mucous and when she blows her nose its thick  Yellow mucous

## 2015-02-06 NOTE — Telephone Encounter (Signed)
[place] [facility]  Patient Name: Jocelyn Mendez  DOB: 12-Apr-1930    Initial Comment Caller states mother is needing antibiotics because she is very sick and can barely talk. Mother has a fever and has a cough.   Nurse Assessment  Nurse: Harlow Mares, RN, Suanne Marker Date/Time Eilene Ghazi Time): 02/06/2015 3:16:47 PM  Confirm and document reason for call. If symptomatic, describe symptoms. ---Caller states mother is needing antibiotics because she is very sick and can barely talk. Mother has a fever and has a cough. Reports cough is white and thick. Reports that the patient is also SOB. Denies chest pain. Symptoms began on 01/30/15. Reports that symptoms are getting worse.  Has the patient traveled out of the country within the last 30 days? ---Not Applicable  Does the patient have any new or worsening symptoms? ---Yes  Will a triage be completed? ---Yes  Related visit to physician within the last 2 weeks? ---No  Does the PT have any chronic conditions? (i.e. diabetes, asthma, etc.) ---Yes  List chronic conditions. ---diabetes, hypertension, high chol, thyroid (hypo)  Is this a behavioral health or substance abuse call? ---No     Guidelines    Guideline Title Affirmed Question Affirmed Notes  Cough - Acute Productive [1] Fever > 100.5 F (38.1 C) AND [2] diabetes mellitus or weak immune system (e.g., HIV positive, cancer chemo, splenectomy, organ transplant, chronic steroids)    Final Disposition User   See Physician within 4 Hours (or PCP triage) Harlow Mares, RN, Rhonda    Referrals  GO TO FACILITY UNDECIDED   Disagree/Comply: Comply

## 2015-02-16 ENCOUNTER — Telehealth: Payer: Self-pay | Admitting: Endocrinology

## 2015-02-16 ENCOUNTER — Telehealth: Payer: Self-pay | Admitting: Internal Medicine

## 2015-02-16 NOTE — Telephone Encounter (Signed)
Please reduce insulin to 70 units qam i'll see you next time.

## 2015-02-16 NOTE — Telephone Encounter (Signed)
Patients daughter called stating that she would like the nurse to call her mom asap Jocelyn Mendez was put on prednisone; she is done and sugars will not go above 100  Please advise patient on what she needs to do  Her daughter is worried    Thank you

## 2015-02-16 NOTE — Telephone Encounter (Signed)
I contacted the pt and advise of note below. She voiced understanding.

## 2015-02-16 NOTE — Telephone Encounter (Signed)
I contacted the pt.. She stated for the past three days her blood sugar has been averaging between 70-90. Pt stated she has not been writing her blood sugars down and could not give me the time of day when her sugars were at the lowest and the highest. Pt stated yesterday evening her blood sugar was 50. Pt is taking 80 units of humulin n every morning.  Please advise, Thanks!

## 2015-02-16 NOTE — Telephone Encounter (Signed)
[facility] - Client [place] [facility] Patient Name: Jocelyn Mendez DOB: 1930-11-07 Initial Comment Caller states she was seen in urgent care last week and was given Prednisone. Now she is done taking the medication but has a blood sugar of 83. Nurse Assessment Nurse: Martyn Ehrich, RN, Felicia Date/Time (Eastern Time): 02/16/2015 3:31:25 PM Confirm and document reason for call. If symptomatic, describe symptoms. ---PT had 83 blood sugar and now it is 94 after a biscuit with gravy and bacon. Last week she was on prednisone and stopped it the day before yesterday. She tapered it down bc it was affecting her blood sugar - got up to 411 a few days ago. It was for a head cold in sinuses. No asthma and no COPD. No fever. Still has head cold - lungs were clear 2 1/2 weeks ago. She still has a moderate cough today. Has used albuterol in the past once or twice Has the patient traveled out of the country within the last 30 days? ---NoDoes the patient have any new or worsening symptoms? ---Yes Will a triage be completed? ---Yes Related visit to physician within the last 2 weeks? ---No Does the PT have any chronic conditions? (i.e. diabetes, asthma, etc.) ---Yes List chronic conditions. ---DM Is this a behavioral health or substance abuse call? ---No Guidelines Guideline Title Affirmed Question Affirmed Notes Diabetes - Low Blood Sugar Caller has URGENT medication or insulin pump question and triager unable to answer question Final Disposition User Call PCP Now Martyn Ehrich, RN, Felicia Comments she takes 80 units every morning of humulin N quick pen. No pillsfor blood sugar. Her diabetic MD that orders her insulin is Armen Pickup across from hospital at another office. Asked her to call him about her medication so he can adjust it. the lowest her blood sugar got today was 76. She declined advice on her cold symptoms will call her back in a few  minutes after she has a chance to speak with diabetic MD to see if she wants more advice on her lingering cough called to check on her and she said her cold is improving and she does not need further help with that. She said Her Diabetic MD is supposed to be calling her back soon. Call Id: [phone]

## 2015-03-27 ENCOUNTER — Ambulatory Visit: Payer: Medicare Other | Admitting: Endocrinology

## 2015-03-27 ENCOUNTER — Encounter: Payer: Self-pay | Admitting: Endocrinology

## 2015-03-27 ENCOUNTER — Ambulatory Visit (INDEPENDENT_AMBULATORY_CARE_PROVIDER_SITE_OTHER): Payer: Medicare Other | Admitting: Endocrinology

## 2015-03-27 VITALS — BP 130/76 | HR 76 | Temp 97.9°F | Ht 66.0 in | Wt 166.0 lb

## 2015-03-27 DIAGNOSIS — E538 Deficiency of other specified B group vitamins: Secondary | ICD-10-CM

## 2015-03-27 DIAGNOSIS — N182 Chronic kidney disease, stage 2 (mild): Secondary | ICD-10-CM | POA: Diagnosis not present

## 2015-03-27 DIAGNOSIS — Z794 Long term (current) use of insulin: Secondary | ICD-10-CM

## 2015-03-27 DIAGNOSIS — E1065 Type 1 diabetes mellitus with hyperglycemia: Secondary | ICD-10-CM | POA: Diagnosis not present

## 2015-03-27 DIAGNOSIS — E1122 Type 2 diabetes mellitus with diabetic chronic kidney disease: Secondary | ICD-10-CM | POA: Diagnosis not present

## 2015-03-27 DIAGNOSIS — IMO0002 Reserved for concepts with insufficient information to code with codable children: Secondary | ICD-10-CM

## 2015-03-27 DIAGNOSIS — N183 Chronic kidney disease, stage 3 unspecified: Secondary | ICD-10-CM

## 2015-03-27 DIAGNOSIS — E1022 Type 1 diabetes mellitus with diabetic chronic kidney disease: Secondary | ICD-10-CM

## 2015-03-27 LAB — POCT GLYCOSYLATED HEMOGLOBIN (HGB A1C): HEMOGLOBIN A1C: 6.9

## 2015-03-27 MED ORDER — CYANOCOBALAMIN 1000 MCG/ML IJ SOLN
1000.0000 ug | Freq: Once | INTRAMUSCULAR | Status: AC
  Administered 2015-03-27: 1000 ug via INTRAMUSCULAR

## 2015-03-27 MED ORDER — INSULIN ISOPHANE HUMAN 100 UNIT/ML KWIKPEN: 70.0000 [IU] | PEN_INJECTOR | SUBCUTANEOUS | Status: DC

## 2015-03-27 NOTE — Patient Instructions (Signed)
check your blood sugar twice a day.  vary the time of day when you check, between before the 3 meals, and at bedtime.  also check if you have symptoms of your blood sugar being too high or too low.  please keep a record of the readings and bring it to your next appointment here.  You can write it on any piece of paper.  please call us sooner if your blood sugar goes below 70, or if you have a lot of readings over 200.   Please come back for a follow-up appointment in 4 months.   Please continue 70 units each morning.

## 2015-03-27 NOTE — Progress Notes (Signed)
Subjective:    Patient ID: Jocelyn Mendez, female    DOB: November 07, 1930, 82 y.o.   MRN: [id number]  HPI Pt returns for f/u of diabetes mellitus: DM type: Insulin-requiring type 2 Dx'ed: 1988.   Complications: polyneuropathy and renal insuff.   Therapy: insulin since 2012.  GDM: never DKA: never Severe hypoglycemia: never.   Pancreatitis: never.   Other: she has chosen qd NPH insulin, for the sake of simplicity; she uses pen, due to visual problems; on levemir, she had am hypoglycemia and pm hyperglycemia.  Interval history: due to hypoglycemia, insulin was reduced to 70 units qam last month.  No further hypoglycemia.  pt states she feels well in general. Past Medical History  Diagnosis Date  . Acute bronchitis   . Unspecified essential hypertension   . Chest pain, unspecified   . Type II or unspecified type diabetes mellitus without mention of complication, not stated as uncontrolled   . Other and unspecified hyperlipidemia   . Unspecified hypothyroidism   . Esophageal reflux   . Diverticulosis of colon (without mention of hemorrhage)   . Unspecified disorder resulting from impaired renal function   . Generalized osteoarthrosis, unspecified site   . Lumbago   . Osteoporosis   . Headache(784.0)   . Anxiety state, unspecified   . Anemia of other chronic disease   . Other B-complex deficiencies   . Neuropathy in diabetes ([place])     bilat LE's    Past Surgical History  Procedure Laterality Date  . Appendectomy      Social History   Social History  . Marital Status: Divorced    Spouse Name: N/A  . Number of Children: 6  . Years of Education: N/A   Occupational History  . Not on file.   Social History Main Topics  . Smoking status: Former Smoker -- 0.50 packs/day for 5 years    Types: Cigarettes    Quit date: 02/10/1973  . Smokeless tobacco: Former Systems developer    Types: Snuff    Quit date: 04/07/2010  . Alcohol Use: No  . Drug Use: No  . Sexual Activity: No   Other  Topics Concern  . Not on file   Social History Narrative   1 child passed away at 68 weeks old---?crib death   Dtr; [name]; Son lives with her [name]; [name]; roger;        Current Outpatient Prescriptions on File Prior to Visit  Medication Sig Dispense Refill  . albuterol (PROVENTIL HFA;VENTOLIN HFA) 108 (90 BASE) MCG/ACT inhaler Inhale 2 puffs into the lungs every 6 (six) hours as needed for wheezing or shortness of breath. 1 Inhaler 6  . amLODipine (NORVASC) 5 MG tablet TAKE 1 TABLET (5 MG TOTAL) BY MOUTH DAILY. 90 tablet 3  . aspirin 81 MG tablet Take 81 mg by mouth daily.      . BD PEN NEEDLE NANO U/F 32G X 4 MM MISC USE AS DIRECTED WITH THE HUMULIN PEN INSULIN 100 each 3  . Brinzolamide-Brimonidine (SIMBRINZA) 1-0.2 % SUSP Place 1 drop into both eyes 2 (two) times daily.    . Cholecalciferol (VITAMIN D3) 2000 UNITS TABS Take 1 tablet by mouth 2 (two) times daily.    . cyanocobalamin (,VITAMIN B-12,) 1000 MCG/ML injection Inject 1 mL (1,000 mcg total) into the muscle every 30 (thirty) days. 1 mL 11  . fenofibrate 160 MG tablet TAKE 1 TABLET EVERY DAY 90 tablet 3  . ferrous sulfate 325 (65  FE) MG EC tablet Take 325 mg by mouth 2 (two) times daily.     . furosemide (LASIX) 20 MG tablet TAKE 1 TABLET BY MOUTH DAILY 30 tablet 1  . glucose blood (ONE TOUCH ULTRA TEST) test strip USE TO TEST BLOOD GLUCOSE FOUR TIMES DAILY 120 each 2  . Insulin Pen Needle 31G X 8 MM MISC 1 each by Does not apply route. Use with insulin pen device, humulin    . Lancets (ONETOUCH ULTRASOFT) lancets Use as instructed 100 each 6  . Lancets 30G MISC 1 each by Does not apply route. Use to check blood sugars four times per day    . levothyroxine (SYNTHROID, LEVOTHROID) 175 MCG tablet Take 1 tablet (175 mcg total) by mouth daily before breakfast. 30 tablet 11  . NEEDLE, DISP, 25 G (BD DISP NEEDLES) 25G X 5/8" MISC Use as directed to administer  the b12 injection monthly 50 each 2  . predniSONE  (DELTASONE) 50 MG tablet Take 1 pill daily for 5 days. 5 tablet 0  . ranitidine (ZANTAC) 150 MG capsule Take 1 capsule by mouth at bedtime.    . rosuvastatin (CRESTOR) 20 MG tablet Take 1 tablet (20 mg total) by mouth daily. 90 tablet 3  . [DISCONTINUED] insulin glargine (LANTUS SOLOSTAR) 100 UNIT/ML injection Inject 100 Units into the skin every morning. 45 mL PRN   No current facility-administered medications on file prior to visit.    Allergies  Allergen Reactions  . Codeine     REACTION: change in mental status  . Diphenhydramine Hcl     REACTION: itching  . Quinine     REACTION: abnormal sensations in face  . Sulfonamide Derivatives     REACTION: rash    Family History  Problem Relation Age of Onset  . Diabetes Mother     BP 130/76 mmHg  Pulse 76  Temp(Src) 97.9 F (36.6 C) (Oral)  Ht 5\' 6"  (1.676 m)  Wt 166 lb (75.297 kg)  BMI 26.81 kg/m2  SpO2 95%  Review of Systems Denies weight change.      Objective:   Physical Exam VITAL SIGNS:  See vs page GENERAL: no distress Pulses: dorsalis pedis intact bilat.  MSK: no deformity of the feet CV: no leg edema Skin: no ulcer on the feet, but the skin is dry. normal color and temp on the feet. Neuro: sensation is intact to touch on the feet.  Ext: There is bilateral onychomycosis of the toenails.    Lab Results  Component Value Date   HGBA1C 6.9 03/27/2015      Assessment & Plan:  DM: well-controlled.    Patient is advised the following: Patient Instructions  check your blood sugar twice a day.  vary the time of day when you check, between before the 3 meals, and at bedtime.  also check if you have symptoms of your blood sugar being too high or too low.  please keep a record of the readings and bring it to your next appointment here.  You can write it on any piece of paper.  please call us sooner if your blood sugar goes below 70, or if you have a lot of readings over 200.   Please come back for a follow-up  appointment in 4 months.   Please continue 70 units each morning.

## 2015-03-28 ENCOUNTER — Ambulatory Visit: Payer: Medicare Other | Admitting: Endocrinology

## 2015-03-28 DIAGNOSIS — E119 Type 2 diabetes mellitus without complications: Secondary | ICD-10-CM | POA: Insufficient documentation

## 2015-04-03 ENCOUNTER — Other Ambulatory Visit: Payer: Self-pay | Admitting: Endocrinology

## 2015-04-03 ENCOUNTER — Other Ambulatory Visit: Payer: Self-pay | Admitting: Internal Medicine

## 2015-04-09 ENCOUNTER — Ambulatory Visit: Payer: Medicare Other | Admitting: Internal Medicine

## 2015-04-16 ENCOUNTER — Encounter: Payer: Self-pay | Admitting: Internal Medicine

## 2015-04-16 ENCOUNTER — Ambulatory Visit (INDEPENDENT_AMBULATORY_CARE_PROVIDER_SITE_OTHER): Payer: Medicare Other | Admitting: Internal Medicine

## 2015-04-16 ENCOUNTER — Other Ambulatory Visit (INDEPENDENT_AMBULATORY_CARE_PROVIDER_SITE_OTHER): Payer: Medicare Other

## 2015-04-16 VITALS — BP 142/68 | HR 77 | Temp 98.2°F | Resp 16 | Ht 66.0 in | Wt 168.0 lb

## 2015-04-16 DIAGNOSIS — R1031 Right lower quadrant pain: Secondary | ICD-10-CM

## 2015-04-16 DIAGNOSIS — E039 Hypothyroidism, unspecified: Secondary | ICD-10-CM | POA: Diagnosis not present

## 2015-04-16 DIAGNOSIS — E538 Deficiency of other specified B group vitamins: Secondary | ICD-10-CM

## 2015-04-16 DIAGNOSIS — R7989 Other specified abnormal findings of blood chemistry: Secondary | ICD-10-CM

## 2015-04-16 LAB — COMPREHENSIVE METABOLIC PANEL
ALK PHOS: 60 U/L (ref 39–117)
ALT: 12 U/L (ref 0–35)
AST: 18 U/L (ref 0–37)
Albumin: 4.4 g/dL (ref 3.5–5.2)
BUN: 39 mg/dL — AB (ref 6–23)
CO2: 24 meq/L (ref 19–32)
Calcium: 10.1 mg/dL (ref 8.4–10.5)
Chloride: 107 mEq/L (ref 96–112)
Creatinine, Ser: 1.64 mg/dL — ABNORMAL HIGH (ref 0.40–1.20)
GFR: 31.85 mL/min — ABNORMAL LOW (ref >60.00)
GLUCOSE: 101 mg/dL — AB (ref 70–99)
POTASSIUM: 4.4 meq/L (ref 3.5–5.1)
SODIUM: 138 meq/L (ref 135–145)
TOTAL PROTEIN: 7 g/dL (ref 6.0–8.3)
Total Bilirubin: 0.4 mg/dL (ref 0.2–1.2)

## 2015-04-16 LAB — LIPID PANEL
CHOLESTEROL: 139 mg/dL (ref 0–200)
HDL: 26.6 mg/dL — AB (ref >39.00)
NONHDL: 112.31
TRIGLYCERIDES: 334 mg/dL — AB (ref 0.0–149.0)
Total CHOL/HDL Ratio: 5
VLDL: 66.8 mg/dL — ABNORMAL HIGH (ref 0.0–40.0)

## 2015-04-16 LAB — TSH: TSH: 3.19 u[IU]/mL (ref 0.35–4.50)

## 2015-04-16 LAB — LDL CHOLESTEROL, DIRECT: Direct LDL: 74 mg/dL

## 2015-04-16 MED ORDER — CYANOCOBALAMIN 1000 MCG/ML IJ SOLN
1000.0000 ug | Freq: Once | INTRAMUSCULAR | Status: AC
  Administered 2015-04-16: 1000 ug via INTRAMUSCULAR

## 2015-04-16 NOTE — Patient Instructions (Signed)
We are checking the labs today and will call you back with the results.   We have given you the B12 shot and will check the ultrasound of the stomach.

## 2015-04-16 NOTE — Progress Notes (Signed)
Pre visit review using our clinic review tool, if applicable. No additional management support is needed unless otherwise documented below in the visit note. 

## 2015-04-17 DIAGNOSIS — R1031 Right lower quadrant pain: Secondary | ICD-10-CM | POA: Insufficient documentation

## 2015-04-17 NOTE — Assessment & Plan Note (Signed)
Checking US abdomen and labs today.

## 2015-04-17 NOTE — Progress Notes (Signed)
   Subjective:    Patient ID: Jocelyn Mendez, female    DOB: 1930-05-13, 82 y.o.   MRN: [id number]  HPI The patient is an 82 YO female coming in for some abdominal bloating and pain. She feels that the left side of her stomach is larger than the right. Associated with some rlq pain as well. Crampy and does not last more than 5 minutes. She is concerned as someone close to her had this problem and found out that he had an aneurysm and wants to be checked.   Review of Systems  Constitutional: Positive for fatigue. Negative for fever, activity change, appetite change and unexpected weight change.  HENT: Negative.   Respiratory: Negative for cough, chest tightness, shortness of breath and wheezing.   Cardiovascular: Negative for chest pain, palpitations and leg swelling.  Gastrointestinal: Positive for abdominal pain and abdominal distention. Negative for nausea, diarrhea and constipation.  Endocrine: Negative.   Musculoskeletal: Negative.   Skin: Negative.   Neurological: Negative.   Psychiatric/Behavioral: Negative.       Objective:   Physical Exam  Constitutional: She is oriented to person, place, and time. She appears well-developed and well-nourished.  HENT:  Head: Normocephalic and atraumatic.  Eyes: EOM are normal.  Neck: Normal range of motion.  Cardiovascular: Normal rate and regular rhythm.   Pulmonary/Chest: Effort normal and breath sounds normal.  Abdominal: Soft. Bowel sounds are normal. There is no tenderness. There is no rebound.  Minimal difference in left to right, no bulging with forced cough  Musculoskeletal: She exhibits no edema.  Neurological: She is alert and oriented to person, place, and time. Coordination normal.  Skin: Skin is warm and dry.  Psychiatric: She has a normal mood and affect.   Filed Vitals:   04/16/15 1129 04/16/15 1405  BP: 170/62 142/68  Pulse: 77   Temp: 98.2 F (36.8 C)   TempSrc: Oral   Resp: 16   Height: 5\' 6"  (1.676 m)   Weight:  168 lb (76.204 kg)   SpO2: 98%       Assessment & Plan:  B12 shot given at visit

## 2015-04-17 NOTE — Assessment & Plan Note (Signed)
B12 shot given today  

## 2015-04-17 NOTE — Assessment & Plan Note (Signed)
Checking TSH and adjust as needed. On 175 mcg daily synthroid now.

## 2015-05-04 ENCOUNTER — Other Ambulatory Visit: Payer: Self-pay | Admitting: Internal Medicine

## 2015-05-04 ENCOUNTER — Telehealth: Payer: Self-pay | Admitting: Endocrinology

## 2015-05-04 NOTE — Telephone Encounter (Signed)
See note below and please advise, Thanks! 

## 2015-05-04 NOTE — Telephone Encounter (Signed)
We are needing to right a letter regarding the humulin N qwikpen stating that Dr. Loanne Drilling did write the rx for the humulin N on 02/27/14 for 3 boxes dose of 145 u every AM and send it to F# [phone] attn Domingo Madeira this is [facility]

## 2015-05-04 NOTE — Telephone Encounter (Signed)
As there is a charge to the patient for this,w e need to know why this is needed

## 2015-05-04 NOTE — Telephone Encounter (Signed)
The letter is needed for insurance purposes. The letter is not for a PA, but for the pharmacy to have on file to prove to the insurance the rx was written by [place] on the date below.

## 2015-05-04 NOTE — Telephone Encounter (Signed)
please call patient: There is a charge for this letter, for the pharmacy to have on file. Do you want me to write it?

## 2015-05-07 ENCOUNTER — Other Ambulatory Visit: Payer: Self-pay | Admitting: Internal Medicine

## 2015-05-07 ENCOUNTER — Ambulatory Visit
Admission: RE | Admit: 2015-05-07 | Discharge: 2015-05-07 | Disposition: A | Discharge status: Home / Self Care | Payer: Medicare Other | Source: Ambulatory Visit | Attending: Internal Medicine | Admitting: Internal Medicine

## 2015-05-07 DIAGNOSIS — K8020 Calculus of gallbladder without cholecystitis without obstruction: Secondary | ICD-10-CM | POA: Diagnosis not present

## 2015-05-07 DIAGNOSIS — R1031 Right lower quadrant pain: Secondary | ICD-10-CM

## 2015-05-07 DIAGNOSIS — N281 Cyst of kidney, acquired: Secondary | ICD-10-CM | POA: Diagnosis not present

## 2015-05-07 DIAGNOSIS — K869 Disease of pancreas, unspecified: Secondary | ICD-10-CM

## 2015-05-08 ENCOUNTER — Telehealth: Payer: Self-pay | Admitting: Endocrinology

## 2015-05-08 NOTE — Telephone Encounter (Signed)
Jocelyn Mendez calling from [place] stated that their pharmacy is being audited by patient's insurance Co, he said that they need a letter stating that Dr Loanne Drilling did write a letter for Humulin N Quik pens 3 boxses  145 units everyday on 02/27/14. Fax # [phone] to [facility] phone # [phone] ex [phone] or [name] ex [phone]

## 2015-05-08 NOTE — Telephone Encounter (Signed)
Pt is welcome to authorize med records release to pharmacy, but I see no prescription for insulin on that day.

## 2015-05-08 NOTE — Telephone Encounter (Signed)
I contacted the pt. She agreed to the letter being completed.

## 2015-05-09 ENCOUNTER — Encounter: Payer: Self-pay | Admitting: Gastroenterology

## 2015-05-09 NOTE — Telephone Encounter (Signed)
I contacted the pharmacy and advised of note below.

## 2015-05-15 ENCOUNTER — Encounter: Payer: Self-pay | Admitting: Endocrinology

## 2015-07-03 ENCOUNTER — Ambulatory Visit (INDEPENDENT_AMBULATORY_CARE_PROVIDER_SITE_OTHER): Payer: Medicare Other | Admitting: Gastroenterology

## 2015-07-03 ENCOUNTER — Encounter: Payer: Self-pay | Admitting: Gastroenterology

## 2015-07-03 VITALS — BP 160/60 | HR 82 | Ht 66.0 in | Wt 170.0 lb

## 2015-07-03 DIAGNOSIS — R140 Abdominal distension (gaseous): Secondary | ICD-10-CM | POA: Diagnosis not present

## 2015-07-03 DIAGNOSIS — N183 Chronic kidney disease, stage 3 unspecified: Secondary | ICD-10-CM

## 2015-07-03 DIAGNOSIS — R935 Abnormal findings on diagnostic imaging of other abdominal regions, including retroperitoneum: Secondary | ICD-10-CM

## 2015-07-03 DIAGNOSIS — K8020 Calculus of gallbladder without cholecystitis without obstruction: Secondary | ICD-10-CM | POA: Diagnosis not present

## 2015-07-03 NOTE — Patient Instructions (Signed)
Follow up as needed.  Thank you for choosing [facility]  Dr Henry Danis III  

## 2015-07-03 NOTE — Progress Notes (Signed)
[facility] Consult Note:  History: Jocelyn Mendez 07/03/2015  Referring physician: Hoyt Koch, MD  Reason for consult/chief complaint: Pancreatic lesion   Subjective HPI:  Patient went to PCP 2 months ago c/o upper abd bloating anf left side more prominent than the right. She c/o "gurgling" and gas. Denies anorexia, nausea, vomiting, early satiety or weight loss. US findings below, then referred. BMs regular without rectal bleeding   ROS:  Review of Systems  Constitutional: Negative for appetite change and unexpected weight change.  HENT: Negative for mouth sores and voice change.   Eyes: Negative for pain and redness.  Respiratory: Negative for cough and shortness of breath.   Cardiovascular: Negative for chest pain and palpitations.  Genitourinary: Negative for dysuria and hematuria.  Musculoskeletal: Positive for arthralgias. Negative for myalgias.  Skin: Negative for pallor and rash.  Neurological: Negative for weakness and headaches.  Hematological: Negative for adenopathy.     Past Medical History: Past Medical History  Diagnosis Date  . Acute bronchitis   . Unspecified essential hypertension   . Chest pain, unspecified   . Type II or unspecified type diabetes mellitus without mention of complication, not stated as uncontrolled   . Other and unspecified hyperlipidemia   . Unspecified hypothyroidism   . Esophageal reflux   . Diverticulosis of colon (without mention of hemorrhage)   . Unspecified disorder resulting from impaired renal function   . Generalized osteoarthrosis, unspecified site   . Lumbago   . Osteoporosis   . Headache(784.0)   . Anxiety state, unspecified   . Anemia of other chronic disease   . Other B-complex deficiencies   . Neuropathy in diabetes ([place])     bilat LE's  . Gallstones      Past Surgical History: Past Surgical History  Procedure Laterality Date  . Appendectomy       Family History: Family  History  Problem Relation Age of Onset  . Diabetes Mother   . Cancer Father     unsure ? stomach  . Heart attack Brother     x 3  . Cancer Sister     unsure type  . Lung cancer Brother     smoker  . Heart attack Sister     Social History: Social History   Social History  . Marital Status: Divorced    Spouse Name: N/A  . Number of Children: 6  . Years of Education: N/A   Occupational History  . retired    Social History Main Topics  . Smoking status: Former Smoker -- 0.50 packs/day for 6 years    Types: Cigarettes    Quit date: 02/10/1973  . Smokeless tobacco: Former Systems developer    Types: Snuff    Quit date: 04/07/2010  . Alcohol Use: No  . Drug Use: No  . Sexual Activity: No   Other Topics Concern  . None   Social History Narrative   1 child passed away at 3 weeks old---?crib death   Dtr; [name]; Son lives with her [name]; [name]; roger;        Allergies: Allergies  Allergen Reactions  . Codeine     REACTION: change in mental status  . Diphenhydramine Hcl     REACTION: itching  . Quinine     REACTION: abnormal sensations in face  . Sulfonamide Derivatives     REACTION: rash    Outpatient Meds: Current Outpatient Prescriptions  Medication Sig Dispense Refill  . albuterol (PROVENTIL  HFA;VENTOLIN HFA) 108 (90 BASE) MCG/ACT inhaler Inhale 2 puffs into the lungs every 6 (six) hours as needed for wheezing or shortness of breath. 1 Inhaler 6  . amLODipine (NORVASC) 5 MG tablet TAKE 1 TABLET BY MOUTH DAILY - EMERGENCY REFILL FAXED DR 30 tablet 5  . aspirin 81 MG tablet Take 81 mg by mouth daily.      . BD PEN NEEDLE NANO U/F 32G X 4 MM MISC USE AS DIRECTED WITH THE HUMULIN PEN INSULIN 100 each 3  . Brinzolamide-Brimonidine (SIMBRINZA) 1-0.2 % SUSP Place 1 drop into both eyes 2 (two) times daily.    . Cholecalciferol (VITAMIN D3) 2000 UNITS TABS Take 1 tablet by mouth 2 (two) times daily.    . cyanocobalamin (,VITAMIN B-12,) 1000 MCG/ML injection  Inject 1 mL (1,000 mcg total) into the muscle every 30 (thirty) days. 1 mL 11  . fenofibrate 160 MG tablet TAKE 1 TABLET BY MOUTH DAILY - EMERGENCY REFILL FAXED DR 30 tablet 5  . ferrous sulfate 325 (65 FE) MG EC tablet Take 325 mg by mouth 2 (two) times daily.     . furosemide (LASIX) 20 MG tablet TAKE 1 TABLET BY MOUTH DAILY 30 tablet 1  . glucose blood (ONE TOUCH ULTRA TEST) test strip USE TO TEST BLOOD SUGAR 4 TIMES DAILY. DX: E10.22 125 each 3  . Insulin NPH, Human,, Isophane, (HUMULIN N) 100 UNIT/ML Kiwkpen Inject 70 Units into the skin every morning. 30 mL 11  . Insulin Pen Needle 31G X 8 MM MISC 1 each by Does not apply route. Use with insulin pen device, humulin    . Lancets (ONETOUCH ULTRASOFT) lancets Use as instructed 100 each 6  . levothyroxine (SYNTHROID, LEVOTHROID) 175 MCG tablet Take 1 tablet (175 mcg total) by mouth daily before breakfast. 30 tablet 11  . NEEDLE, DISP, 25 G (BD DISP NEEDLES) 25G X 5/8" MISC Use as directed to administer  the b12 injection monthly 50 each 2  . rosuvastatin (CRESTOR) 20 MG tablet TAKE 1 TABLET BY MOUTH DAILY 90 tablet 3  . [DISCONTINUED] insulin glargine (LANTUS SOLOSTAR) 100 UNIT/ML injection Inject 100 Units into the skin every morning. 45 mL PRN   No current facility-administered medications for this visit.      ___________________________________________________________________ Objective  Exam:  BP 160/60 mmHg  Pulse 82  Ht 5\' 6"  (1.676 m)  Wt 170 lb (77.111 kg)  BMI 27.45 kg/m2 2 daughters present  General: this is a(n) elderly woman in no acute distress, no muscle wasting   Eyes: sclera anicteric, no redness  ENT: oral mucosa moist without lesions, no cervical or supraclavicular lymphadenopathy, good dentition  CV: RRR without murmur, S1/S2, no JVD, no peripheral edema  Resp: clear to auscultation bilaterally, normal RR and effort noted  GI: soft, no tenderness, with active bowel sounds. No guarding or palpable  organomegaly noted.when standing, there is slightly more protuberance of left upper abdomen compared to right  Skin; warm and dry, no rash or jaundice noted  Neuro: awake, alert and oriented x 3. Normal gross motor function and fluent speech  Labs:  LFTs nml 04/16/15  Radiologic Studies:  See [place] report.  Gb stones, CBD 17mm, ? Small panc head lesion "Pancreas: Questionable small hypoechoic focus pancreatic head. Pancreatic duct 4 mm. Further evaluation of the pancreas and biliary system with gadolinium-enhanced MRI of the abdomen with MRCP is suggested .     Assessment: Encounter Diagnoses  Name Primary?  . Abdominal bloating Yes  .  Gallstones   . Abnormal magnetic resonance imaging of abdomen   . CKD (chronic kidney disease) stage 3, GFR 30-59 ml/min     Symptoms sound benign, some bloating and gas without red flag symptoms.  Gallstones are asymptomatic.  I described typical GB symptoms, in case they occur.  I think this questionable pancreatic finding is incidental and unlikely of clinical significance.  Her renal function precludes IV contrast for CT.  She was offered MRI/MRCP, but states she cannot do so due to claustrophobia (even if premedicated with Valium).  Says she tried to do so in past for back MRI. My suspicion for this finding being clinically relevant is not high enough to warrant risks of EUS. LFTs normal in March, and she is not jaundice now, so no biliary obstruction.  Plan:  Continued observation, she knows to alert [place] if pain intensifies, jaundice develops, or if she changes her mind about an MRI  Thank you for the courtesy of this consult.  Please call me with any questions or concerns.  Nelida Meuse III  CC: Hoyt Koch, MD

## 2015-07-04 ENCOUNTER — Emergency Department (HOSPITAL_COMMUNITY): Payer: Medicare Other

## 2015-07-04 ENCOUNTER — Telehealth: Payer: Self-pay | Admitting: Internal Medicine

## 2015-07-04 ENCOUNTER — Emergency Department (HOSPITAL_COMMUNITY)
Admission: EM | Admit: 2015-07-04 | Discharge: 2015-07-04 | Disposition: A | Discharge status: Home / Self Care | Payer: Medicare Other | Attending: Emergency Medicine | Admitting: Emergency Medicine

## 2015-07-04 ENCOUNTER — Other Ambulatory Visit: Payer: Self-pay | Admitting: Endocrinology

## 2015-07-04 ENCOUNTER — Encounter (HOSPITAL_COMMUNITY): Payer: Self-pay

## 2015-07-04 DIAGNOSIS — Z79899 Other long term (current) drug therapy: Secondary | ICD-10-CM | POA: Diagnosis not present

## 2015-07-04 DIAGNOSIS — Z7982 Long term (current) use of aspirin: Secondary | ICD-10-CM | POA: Diagnosis not present

## 2015-07-04 DIAGNOSIS — E039 Hypothyroidism, unspecified: Secondary | ICD-10-CM | POA: Diagnosis not present

## 2015-07-04 DIAGNOSIS — Z87891 Personal history of nicotine dependence: Secondary | ICD-10-CM | POA: Insufficient documentation

## 2015-07-04 DIAGNOSIS — M159 Polyosteoarthritis, unspecified: Secondary | ICD-10-CM | POA: Diagnosis not present

## 2015-07-04 DIAGNOSIS — R0789 Other chest pain: Secondary | ICD-10-CM | POA: Insufficient documentation

## 2015-07-04 DIAGNOSIS — R079 Chest pain, unspecified: Secondary | ICD-10-CM

## 2015-07-04 DIAGNOSIS — Z794 Long term (current) use of insulin: Secondary | ICD-10-CM | POA: Diagnosis not present

## 2015-07-04 DIAGNOSIS — I10 Essential (primary) hypertension: Secondary | ICD-10-CM | POA: Diagnosis not present

## 2015-07-04 DIAGNOSIS — M810 Age-related osteoporosis without current pathological fracture: Secondary | ICD-10-CM | POA: Insufficient documentation

## 2015-07-04 DIAGNOSIS — E1140 Type 2 diabetes mellitus with diabetic neuropathy, unspecified: Secondary | ICD-10-CM | POA: Insufficient documentation

## 2015-07-04 LAB — I-STAT TROPONIN, ED: TROPONIN I, POC: 0.01 ng/mL (ref 0.00–0.08)

## 2015-07-04 LAB — CBC
HCT: 38.7 % (ref 36.0–46.0)
Hemoglobin: 12.5 g/dL (ref 12.0–15.0)
MCH: 29.6 pg (ref 26.0–34.0)
MCHC: 32.3 g/dL (ref 30.0–36.0)
MCV: 91.5 fL (ref 78.0–100.0)
PLATELETS: 229 10*3/uL (ref 150–400)
RBC: 4.23 MIL/uL (ref 3.87–5.11)
RDW: 12.9 % (ref 11.5–15.5)
WBC: 5 10*3/uL (ref 4.0–10.5)

## 2015-07-04 LAB — BASIC METABOLIC PANEL
Anion gap: 8 (ref 5–15)
BUN: 30 mg/dL — ABNORMAL HIGH (ref 6–20)
CHLORIDE: 104 mmol/L (ref 101–111)
CO2: 23 mmol/L (ref 22–32)
CREATININE: 1.49 mg/dL — AB (ref 0.44–1.00)
Calcium: 9.6 mg/dL (ref 8.9–10.3)
GFR calc non Af Amer: 32 mL/min — ABNORMAL LOW (ref >60)
GFR, EST AFRICAN AMERICAN: 37 mL/min — AB (ref >60)
Glucose, Bld: 233 mg/dL — ABNORMAL HIGH (ref 65–99)
POTASSIUM: 4.5 mmol/L (ref 3.5–5.1)
SODIUM: 135 mmol/L (ref 135–145)

## 2015-07-04 NOTE — ED Notes (Signed)
MD at bedside. 

## 2015-07-04 NOTE — ED Notes (Signed)
Patient reports that she has had intermittent chest pain x 2 weeks.  Patient reports a history of gallstones.

## 2015-07-04 NOTE — ED Provider Notes (Signed)
CSN: [id number]     Arrival date & time 07/04/15  1451 History   First MD Initiated Contact with Patient 07/04/15 1608     Chief Complaint  Patient presents with  . Chest Pain     (Consider location/radiation/quality/duration/timing/severity/associated sxs/prior Treatment) HPI   Jocelyn Mendez is a 82 y.o. female who presents for evaluation of chest discomfort which she describes as a spasm. She has had it intermittently for 2 weeks, and her last episode was yesterday. Her daughter encouraged her to come in today because she learned of the pain yesterday during a visit to the patient's gastroenterologist. The patient was evaluated yesterday for an abnormal ultrasound. The decision was made to follow this expectantly, without intervention at this time. The patient's daughters considering getting a second opinion. The patient was diagnosed with gallstones, in March. She does not have chronic abdominal pain or anorexia. There's been no fever, chills, cough or shortness of breath. She has never had cardiac disease that she knows of. She is taking her usual medications. There are no other no modifying factors.    Past Medical History  Diagnosis Date  . Acute bronchitis   . Unspecified essential hypertension   . Chest pain, unspecified   . Type II or unspecified type diabetes mellitus without mention of complication, not stated as uncontrolled   . Other and unspecified hyperlipidemia   . Unspecified hypothyroidism   . Esophageal reflux   . Diverticulosis of colon (without mention of hemorrhage)   . Unspecified disorder resulting from impaired renal function   . Generalized osteoarthrosis, unspecified site   . Lumbago   . Osteoporosis   . Headache(784.0)   . Anxiety state, unspecified   . Anemia of other chronic disease   . Other B-complex deficiencies   . Neuropathy in diabetes ([place])     bilat LE's  . Gallstones    Past Surgical History  Procedure Laterality Date  . Appendectomy     . Thyroidectomy     Family History  Problem Relation Age of Onset  . Diabetes Mother   . Cancer Father     unsure ? stomach  . Heart attack Brother     x 3  . Cancer Sister     unsure type  . Lung cancer Brother     smoker  . Heart attack Sister    Social History  Substance Use Topics  . Smoking status: Former Smoker -- 0.50 packs/day for 6 years    Types: Cigarettes    Quit date: 02/10/1973  . Smokeless tobacco: Former Systems developer    Types: Snuff    Quit date: 04/07/2010  . Alcohol Use: No   OB History    No data available     Review of Systems  All other systems reviewed and are negative.     Allergies  Codeine; Diphenhydramine hcl; Quinine; and Sulfonamide derivatives  Home Medications   Prior to Admission medications   Medication Sig Start Date End Date Taking? Authorizing Provider  amLODipine (NORVASC) 5 MG tablet TAKE 1 TABLET BY MOUTH DAILY - EMERGENCY REFILL FAXED DR 05/04/15  Yes Hoyt Koch, MD  aspirin 81 MG tablet Take 81 mg by mouth daily.     Yes Historical Provider, MD  Brinzolamide-Brimonidine [facility]) 1-0.2 % SUSP Place 1 drop into both eyes 2 (two) times daily.   Yes Historical Provider, MD  Cholecalciferol (VITAMIN D3) 2000 UNITS TABS Take 1 tablet by mouth 2 (two) times daily.   Yes  Historical Provider, MD  fenofibrate 160 MG tablet TAKE 1 TABLET BY MOUTH DAILY - EMERGENCY REFILL FAXED DR 05/04/15  Yes Hoyt Koch, MD  ferrous sulfate 325 (65 FE) MG EC tablet Take 325 mg by mouth 2 (two) times daily.    Yes Historical Provider, MD  furosemide (LASIX) 20 MG tablet TAKE 1 TABLET BY MOUTH DAILY 08/03/14  Yes Hoyt Koch, MD  Insulin NPH, Human,, Isophane, (HUMULIN N) 100 UNIT/ML Kiwkpen Inject 70 Units into the skin every morning. 03/27/15  Yes Renato Shin, MD  levothyroxine (SYNTHROID, LEVOTHROID) 175 MCG tablet TAKE 1 TABLET BY MOUTH EVERY DAY BEFORE BREAKFAST 07/04/15  Yes Renato Shin, MD  rosuvastatin (CRESTOR) 20 MG tablet  TAKE 1 TABLET BY MOUTH DAILY 04/03/15  Yes Hoyt Koch, MD  albuterol (PROVENTIL HFA;VENTOLIN HFA) 108 (90 BASE) MCG/ACT inhaler Inhale 2 puffs into the lungs every 6 (six) hours as needed for wheezing or shortness of breath. 04/06/14   Hoyt Koch, MD  BD PEN NEEDLE NANO U/F 32G X 4 MM MISC USE AS DIRECTED WITH THE HUMULIN PEN INSULIN 03/21/13   Renato Shin, MD  cyanocobalamin (,VITAMIN B-12,) 1000 MCG/ML injection Inject 1 mL (1,000 mcg total) into the muscle every 30 (thirty) days. 10/05/14   Hoyt Koch, MD  glucose blood (ONE TOUCH ULTRA TEST) test strip USE TO TEST BLOOD SUGAR 4 TIMES DAILY. DX: Z12.45 04/03/15   Renato Shin, MD  Insulin Pen Needle 31G X 8 MM MISC 1 each by Does not apply route. Use with insulin pen device, humulin    Historical Provider, MD  Lancets (ONETOUCH ULTRASOFT) lancets Use as instructed 02/15/13   Noralee Space, MD  NEEDLE, DISP, 25 G (BD DISP NEEDLES) 25G X 5/8" MISC Use as directed to administer  the b12 injection monthly 10/05/14   Hoyt Koch, MD   BP 151/66 mmHg  Pulse 80  Temp(Src) 98.8 F (37.1 C) (Oral)  Resp 18  SpO2 95% Physical Exam  Constitutional: She is oriented to person, place, and time. She appears well-developed.  Elderly, frail.  HENT:  Head: Normocephalic and atraumatic.  Right Ear: External ear normal.  Left Ear: External ear normal.  Eyes: Conjunctivae and EOM are normal. Pupils are equal, round, and reactive to light.  Neck: Normal range of motion and phonation normal. Neck supple.  Cardiovascular: Normal rate, regular rhythm and normal heart sounds.   Pulmonary/Chest: Effort normal and breath sounds normal. No respiratory distress. She has no wheezes. She has no rales. She exhibits no tenderness and no bony tenderness.  No tenderness or crepitation of the chest wall.  Abdominal: Soft. There is no tenderness.  Musculoskeletal: Normal range of motion.  Kyphotic. Normal range of motion, arms, legs.   Neurological: She is alert and oriented to person, place, and time. No cranial nerve deficit or sensory deficit. She exhibits normal muscle tone. Coordination normal.  Skin: Skin is warm, dry and intact.  Psychiatric: She has a normal mood and affect. Her behavior is normal. Judgment and thought content normal.  Nursing note and vitals reviewed.   ED Course  Procedures (including critical care time)  Medications - No data to display  Patient Vitals for the past 24 hrs:  BP Temp Temp src Pulse Resp SpO2  07/04/15 1716 151/66 mmHg - - 80 18 95 %  07/04/15 1615 159/66 mmHg 98.8 F (37.1 C) Oral 90 17 95 %  07/04/15 1502 114/64 mmHg 98.8 F (37.1 C) Oral 96  22 95 %    5:12 PM Reevaluation with update and discussion. After initial assessment and treatment, an updated evaluation reveals No additional complaints. Findings discussed with patient and daughter, all questions were answered. [place] Review Labs Reviewed  BASIC METABOLIC PANEL - Abnormal; Notable for the following:    Glucose, Bld 233 (*)    BUN 30 (*)    Creatinine, Ser 1.49 (*)    GFR calc non Af Amer 32 (*)    GFR calc Af Amer 37 (*)    All other components within normal limits  CBC  I-STAT TROPOININ, ED   Component     Latest Ref Rng 11/22/2014 04/16/2015 07/04/2015            Sodium     135 - 145 mmol/L 141 138 135  Potassium     3.5 - 5.1 mmol/L 3.8 4.4 4.5  Chloride     101 - 111 mmol/L 106 107 104  CO2     22 - 32 mmol/L '27 24 23  '$ Glucose     65 - 99 mg/dL 68 (L) 101 (H) 233 (H)  BUN     6 - 20 mg/dL 21 39 (H) 30 (H)  Creatinine     0.44 - 1.00 mg/dL 1.68 (H) 1.64 (H) 1.49 (H)  Calcium     8.9 - 10.3 mg/dL 9.7 10.1 9.6  EGFR (Non-African Amer.)     >60 mL/min   32 (L)  EGFR (African American)     >60 mL/min   37 (L)  Anion gap     5 - 15   8     Imaging Review Dg Chest 2 View  07/04/2015  CLINICAL DATA:  82 year old female with chest pain for 2 weeks. Former smoker. Initial  encounter. EXAM: CHEST  2 VIEW COMPARISON:  02/06/2015 and earlier. FINDINGS: Stable cardiomegaly and mediastinal contours. Stable lung volumes. Visualized tracheal air column is within normal limits. No pneumothorax, pulmonary edema, pleural effusion or confluent pulmonary opacity. Stable mild increased interstitial markings. Osteopenia. Stable visualized osseous structures. Chronic calcified aortic atherosclerosis. IMPRESSION: No acute cardiopulmonary abnormality. Electronically Signed   By: Genevie Ann M.D.   On: 07/04/2015 15:35   I have personally reviewed and evaluated these images and lab results as part of my medical decision-making.   EKG Interpretation   Date/Time:  Wednesday Jul 04 2015 15:00:33 EDT Ventricular Rate:  94 PR Interval:  261 QRS Duration: 93 QT Interval:  349 QTC Calculation: 436 R Axis:   -39 Text Interpretation:  Sinus rhythm Prolonged PR interval Inferior infarct,  old Anterior infarct, old Since last tracing PR interval is longer  Confirmed by [facility]  MD, Pharoah Goggins [phone]) on 07/04/2015 4:08:46 PM      MDM   Final diagnoses:  Nonspecific chest pain    Nonspecific chest pain, none since yesterday. Doubt ACS, PE or pneumonia. Chronic stable renal insufficiency. Known gallstones, without evidence for ongoing gastrointestinal symptoms.  Nursing Notes Reviewed/ Care Coordinated Applicable Imaging Reviewed Interpretation of Laboratory Data incorporated into ED treatment  The patient appears reasonably screened and/or stabilized for discharge and I doubt any other medical condition or other [facility] requiring further screening, evaluation, or treatment in the ED at this time prior to discharge.  Plan: Home Medications- APAP; Home Treatments- HEat; return here if the recommended treatment, does not improve the symptoms; Recommended follow up- PCP 1 week for check up    Daleen Bo, MD 07/04/15  1719 

## 2015-07-04 NOTE — Telephone Encounter (Signed)
Patient Name: Jocelyn Mendez  DOB: 1930-04-05    Initial Comment Caller states, mother has chest pains for 3-4 days    Nurse Assessment  Nurse: Thad Ranger, RN, Langley Gauss Date/Time (Eastern Time): 07/04/2015 1:03:38 PM  Confirm and document reason for call. If symptomatic, describe symptoms. You must click the next button to save text entered. ---Caller states mother has chest pains for 3-4 days. Pt is not with the caller at this time. Caller called her sister to conference the pt in on the call in order for RN to adeq assess/triage. Pt states she has been having CP x 1 wk. States she thinks she is having muscle spasms.  Has the patient traveled out of the country within the last 30 days? ---Not Applicable  Does the patient have any new or worsening symptoms? ---Yes  Will a triage be completed? ---Yes  Related visit to physician within the last 2 weeks? ---Yes  Does the PT have any chronic conditions? (i.e. diabetes, asthma, etc.) ---Yes  List chronic conditions. ---CAD, Diabetes, HTN  Is this a behavioral health or substance abuse call? ---No     Guidelines    Guideline Title Affirmed Question Affirmed Notes  Chest Pain [1] Intermittent chest pain or "angina" AND [2] increasing in severity or frequency (Exception: pains lasting a few seconds)    Final Disposition User   Go to ED Now Thad Ranger, RN, [place]    Referrals  [facility]  [facility]   Disagree/Comply: Comply

## 2015-07-04 NOTE — Discharge Instructions (Signed)
Use heat on the sore area 3 or 4 times a day and take Tylenol as needed for pain.    Nonspecific Chest Pain It is often hard to find the cause of chest pain. There is always a chance that your pain could be related to something serious, such as a heart attack or a blood clot in your lungs. Chest pain can also be caused by conditions that are not life-threatening. If you have chest pain, it is very important to follow up with your doctor.  HOME CARE  If you were prescribed an antibiotic medicine, finish it all even if you start to feel better.  Avoid any activities that cause chest pain.  Do not use any tobacco products, including cigarettes, chewing tobacco, or electronic cigarettes. If you need help quitting, ask your doctor.  Do not drink alcohol.  Take medicines only as told by your doctor.  Keep all follow-up visits as told by your doctor. This is important. This includes any further testing if your chest pain does not go away.  Your doctor may tell you to keep your head raised (elevated) while you sleep.  Make lifestyle changes as told by your doctor. These may include:  Getting regular exercise. Ask your doctor to suggest some activities that are safe for you.  Eating a heart-healthy diet. Your doctor or a diet specialist (dietitian) can help you to learn healthy eating options.  Maintaining a healthy weight.  Managing diabetes, if necessary.  Reducing stress. GET HELP IF:  Your chest pain does not go away, even after treatment.  You have a rash with blisters on your chest.  You have a fever. GET HELP RIGHT AWAY IF:  Your chest pain is worse.  You have an increasing cough, or you cough up blood.  You have severe belly (abdominal) pain.  You feel extremely weak.  You pass out (faint).  You have chills.  You have sudden, unexplained chest discomfort.  You have sudden, unexplained discomfort in your arms, back, neck, or jaw.  You have shortness of breath  at any time.  You suddenly start to sweat, or your skin gets clammy.  You feel nauseous.  You vomit.  You suddenly feel light-headed or dizzy.  Your heart begins to beat quickly, or it feels like it is skipping beats. These symptoms may be an emergency. Do not wait to see if the symptoms will go away. Get medical help right away. Call your local emergency services (911 in the [place] Do not drive yourself to the hospital.   This information is not intended to replace advice given to you by your health care provider. Make sure you discuss any questions you have with your health care provider.   Document Released: 07/16/2007 Document Revised: 02/17/2014 Document Reviewed: 09/02/2013 Elsevier Interactive Patient Education [employer].

## 2015-07-17 ENCOUNTER — Encounter: Payer: Self-pay | Admitting: Endocrinology

## 2015-07-17 ENCOUNTER — Ambulatory Visit (INDEPENDENT_AMBULATORY_CARE_PROVIDER_SITE_OTHER): Payer: Medicare Other | Admitting: Endocrinology

## 2015-07-17 VITALS — BP 158/64 | HR 90 | Ht 66.0 in | Wt 167.0 lb

## 2015-07-17 DIAGNOSIS — N183 Chronic kidney disease, stage 3 (moderate): Secondary | ICD-10-CM | POA: Diagnosis not present

## 2015-07-17 DIAGNOSIS — E538 Deficiency of other specified B group vitamins: Secondary | ICD-10-CM

## 2015-07-17 DIAGNOSIS — E1122 Type 2 diabetes mellitus with diabetic chronic kidney disease: Secondary | ICD-10-CM

## 2015-07-17 DIAGNOSIS — Z794 Long term (current) use of insulin: Secondary | ICD-10-CM | POA: Diagnosis not present

## 2015-07-17 LAB — POCT GLYCOSYLATED HEMOGLOBIN (HGB A1C): Hemoglobin A1C: 9.2

## 2015-07-17 MED ORDER — INSULIN ISOPHANE HUMAN 100 UNIT/ML KWIKPEN: 90.0000 [IU] | PEN_INJECTOR | SUBCUTANEOUS | Status: DC

## 2015-07-17 MED ORDER — CYANOCOBALAMIN 1000 MCG/ML IJ SOLN
1000.0000 ug | Freq: Once | INTRAMUSCULAR | Status: AC
  Administered 2015-07-17: 1000 ug via INTRAMUSCULAR

## 2015-07-17 NOTE — Patient Instructions (Addendum)
check your blood sugar twice a day.  vary the time of day when you check, between before the 3 meals, and at bedtime.  also check if you have symptoms of your blood sugar being too high or too low.  please keep a record of the readings and bring it to your next appointment here.  You can write it on any piece of paper.  please call us sooner if your blood sugar goes below 70, or if you have a lot of readings over 200.   Please come back for a follow-up appointment in 3 months.   Please increase the insulin to 90 units each morning.   Please call us in a few days, to tell us how the blood sugar is doing.

## 2015-07-17 NOTE — Progress Notes (Signed)
Subjective:    Patient ID: Jocelyn Mendez, female    DOB: 13-Oct-1930, 82 y.o.   MRN: [id number]  HPI Pt returns for f/u of diabetes mellitus: DM type: Insulin-requiring type 2 Dx'ed: 1988.   Complications: polyneuropathy and renal insuff.   Therapy: insulin since 2012.  GDM: never DKA: never Severe hypoglycemia: never.   Pancreatitis: never.   Other: she has chosen qd insulin, for the sake of simplicity; she uses pen, due to visual problems; on levemir, she had am hypoglycemia and pm hyperglycemia, so she was changed to QAM NPH.  Interval history: She was on steroids for a few weeks, but finished 1 month ago.  since then, cbg's have stayed over 200.  There is no trend throughout the day.  She feels much better. Past Medical History  Diagnosis Date  . Acute bronchitis   . Unspecified essential hypertension   . Chest pain, unspecified   . Type II or unspecified type diabetes mellitus without mention of complication, not stated as uncontrolled   . Other and unspecified hyperlipidemia   . Unspecified hypothyroidism   . Esophageal reflux   . Diverticulosis of colon (without mention of hemorrhage)   . Unspecified disorder resulting from impaired renal function   . Generalized osteoarthrosis, unspecified site   . Lumbago   . Osteoporosis   . Headache(784.0)   . Anxiety state, unspecified   . Anemia of other chronic disease   . Other B-complex deficiencies   . Neuropathy in diabetes ([place])     bilat LE's  . Gallstones     Past Surgical History  Procedure Laterality Date  . Appendectomy    . Thyroidectomy      Social History   Social History  . Marital Status: Divorced    Spouse Name: N/A  . Number of Children: 6  . Years of Education: N/A   Occupational History  . retired    Social History Main Topics  . Smoking status: Former Smoker -- 0.50 packs/day for 6 years    Types: Cigarettes    Quit date: 02/10/1973  . Smokeless tobacco: Former Systems developer    Types: Snuff   Quit date: 04/07/2010  . Alcohol Use: No  . Drug Use: No  . Sexual Activity: No   Other Topics Concern  . Not on file   Social History Narrative   1 child passed away at 22 weeks old---?crib death   Dtr; [name]; Son lives with her [name]; [name]; roger;        Current Outpatient Prescriptions on File Prior to Visit  Medication Sig Dispense Refill  . albuterol (PROVENTIL HFA;VENTOLIN HFA) 108 (90 BASE) MCG/ACT inhaler Inhale 2 puffs into the lungs every 6 (six) hours as needed for wheezing or shortness of breath. 1 Inhaler 6  . amLODipine (NORVASC) 5 MG tablet TAKE 1 TABLET BY MOUTH DAILY - EMERGENCY REFILL FAXED DR 30 tablet 5  . aspirin 81 MG tablet Take 81 mg by mouth daily.      . BD PEN NEEDLE NANO U/F 32G X 4 MM MISC USE AS DIRECTED WITH THE HUMULIN PEN INSULIN 100 each 3  . Brinzolamide-Brimonidine (SIMBRINZA) 1-0.2 % SUSP Place 1 drop into both eyes 2 (two) times daily.    . Cholecalciferol (VITAMIN D3) 2000 UNITS TABS Take 1 tablet by mouth 2 (two) times daily.    . cyanocobalamin (,VITAMIN B-12,) 1000 MCG/ML injection Inject 1 mL (1,000 mcg total) into the muscle every 30 (thirty)  days. 1 mL 11  . fenofibrate 160 MG tablet TAKE 1 TABLET BY MOUTH DAILY - EMERGENCY REFILL FAXED DR 30 tablet 5  . ferrous sulfate 325 (65 FE) MG EC tablet Take 325 mg by mouth 2 (two) times daily.     . furosemide (LASIX) 20 MG tablet TAKE 1 TABLET BY MOUTH DAILY 30 tablet 1  . glucose blood (ONE TOUCH ULTRA TEST) test strip USE TO TEST BLOOD SUGAR 4 TIMES DAILY. DX: E10.22 125 each 3  . Insulin Pen Needle 31G X 8 MM MISC 1 each by Does not apply route. Use with insulin pen device, humulin    . Lancets (ONETOUCH ULTRASOFT) lancets Use as instructed 100 each 6  . levothyroxine (SYNTHROID, LEVOTHROID) 175 MCG tablet TAKE 1 TABLET BY MOUTH EVERY DAY BEFORE BREAKFAST 30 tablet 11  . NEEDLE, DISP, 25 G (BD DISP NEEDLES) 25G X 5/8" MISC Use as directed to administer  the b12 injection monthly  50 each 2  . rosuvastatin (CRESTOR) 20 MG tablet TAKE 1 TABLET BY MOUTH DAILY 90 tablet 3  . [DISCONTINUED] insulin glargine (LANTUS SOLOSTAR) 100 UNIT/ML injection Inject 100 Units into the skin every morning. 45 mL PRN   No current facility-administered medications on file prior to visit.    Allergies  Allergen Reactions  . Codeine     REACTION: change in mental status  . Diphenhydramine Hcl     REACTION: itching  . Quinine     REACTION: abnormal sensations in face  . Sulfonamide Derivatives     REACTION: rash    Family History  Problem Relation Age of Onset  . Diabetes Mother   . Cancer Father     unsure ? stomach  . Heart attack Brother     x 3  . Cancer Sister     unsure type  . Lung cancer Brother     smoker  . Heart attack Sister     BP 158/64 mmHg  Pulse 90  Ht 5\' 6"  (1.676 m)  Wt 167 lb (75.751 kg)  BMI 26.97 kg/m2  SpO2 94%   Review of Systems She denies hypoglycemia    Objective:   Physical Exam VITAL SIGNS:  See vs page.  GENERAL: no distress.  Pulses: dorsalis pedis intact bilat. MSK: no deformity of the feet.  CV: no leg edema.  Skin: no ulcer on the feet, but the skin is dry. normal color and temp on the feet.   Neuro: sensation is intact to touch on the feet.  Ext: There is bilateral onychomycosis of the toenails.     A1c=9.2%    Assessment & Plan:  DM: worse. AECB: steroids for this are exacerbating glycemic control. I told pt insulin requirement may decrease again over time.  Patient is advised the following: Patient Instructions  check your blood sugar twice a day.  vary the time of day when you check, between before the 3 meals, and at bedtime.  also check if you have symptoms of your blood sugar being too high or too low.  please keep a record of the readings and bring it to your next appointment here.  You can write it on any piece of paper.  please call us sooner if your blood sugar goes below 70, or if you have a lot of  readings over 200.   Please come back for a follow-up appointment in 3 months.   Please increase the insulin to 90 units each morning.   Please call  us in a few days, to tell us how the blood sugar is doing.     Renato Shin, MD

## 2015-07-20 ENCOUNTER — Telehealth: Payer: Self-pay | Admitting: Endocrinology

## 2015-07-20 MED ORDER — INSULIN ISOPHANE HUMAN 100 UNIT/ML KWIKPEN: 100.0000 [IU] | PEN_INJECTOR | SUBCUTANEOUS | Status: DC

## 2015-07-20 NOTE — Telephone Encounter (Signed)
I contacted the pt's daughter and advised of note below. She voiced understanding.

## 2015-07-20 NOTE — Telephone Encounter (Signed)
Please increase the insulin to 100 units each morning. Call next week if it stays high

## 2015-07-20 NOTE — Telephone Encounter (Signed)
Attempted to reach the pt. Pt was unavailable. Will try again at a alter time.

## 2015-07-20 NOTE — Telephone Encounter (Signed)
Pt calling to let us know how the sugar is doing since the increase - her sugars are still over 200 and have been all week

## 2015-07-23 DIAGNOSIS — H401131 Primary open-angle glaucoma, bilateral, mild stage: Secondary | ICD-10-CM | POA: Diagnosis not present

## 2015-07-24 ENCOUNTER — Telehealth: Payer: Self-pay | Admitting: Internal Medicine

## 2015-07-24 NOTE — Telephone Encounter (Signed)
Left a vm advising of note below. Requested a call back if the pt would like to discuss.  

## 2015-07-24 NOTE — Telephone Encounter (Signed)
The patient's daughter Nettie Elm wanted to let Dr. Loanne Drilling know that increasing the dosage on the patient's insulin has not worked.  It's still running over 200 constantly.  She would like a return call at [phone].

## 2015-07-24 NOTE — Telephone Encounter (Signed)
Please increase the insulin to 120 units each morning. Call next week if it stays high.

## 2015-07-31 ENCOUNTER — Telehealth: Payer: Self-pay | Admitting: Endocrinology

## 2015-07-31 NOTE — Telephone Encounter (Signed)
ok 

## 2015-07-31 NOTE — Telephone Encounter (Signed)
See below to be advised.  

## 2015-07-31 NOTE — Telephone Encounter (Signed)
PT never went up to the 120units because the 100units started working for her sugars

## 2015-08-08 ENCOUNTER — Other Ambulatory Visit: Payer: Self-pay | Admitting: Endocrinology

## 2015-08-15 ENCOUNTER — Ambulatory Visit: Payer: Medicare Other | Admitting: Pulmonary Disease

## 2015-09-24 ENCOUNTER — Telehealth: Payer: Self-pay | Admitting: Endocrinology

## 2015-09-24 MED ORDER — INSULIN PEN NEEDLE 32G X 4 MM MISC: 3 refills | Status: DC

## 2015-09-24 NOTE — Telephone Encounter (Signed)
Rx submitted per pt's request.  

## 2015-09-24 NOTE — Telephone Encounter (Signed)
PT is requesting a script for her insulin needles be sent to [facility]

## 2015-10-17 ENCOUNTER — Encounter: Payer: Self-pay | Admitting: Internal Medicine

## 2015-10-17 ENCOUNTER — Ambulatory Visit (INDEPENDENT_AMBULATORY_CARE_PROVIDER_SITE_OTHER): Payer: Medicare Other | Admitting: Internal Medicine

## 2015-10-17 ENCOUNTER — Other Ambulatory Visit (INDEPENDENT_AMBULATORY_CARE_PROVIDER_SITE_OTHER): Payer: Medicare Other

## 2015-10-17 VITALS — BP 132/68 | HR 99 | Temp 98.8°F | Resp 18 | Ht 66.0 in | Wt 173.4 lb

## 2015-10-17 DIAGNOSIS — Z794 Long term (current) use of insulin: Secondary | ICD-10-CM

## 2015-10-17 DIAGNOSIS — N183 Chronic kidney disease, stage 3 unspecified: Secondary | ICD-10-CM

## 2015-10-17 DIAGNOSIS — Z23 Encounter for immunization: Secondary | ICD-10-CM | POA: Diagnosis not present

## 2015-10-17 DIAGNOSIS — E890 Postprocedural hypothyroidism: Secondary | ICD-10-CM

## 2015-10-17 DIAGNOSIS — E538 Deficiency of other specified B group vitamins: Secondary | ICD-10-CM

## 2015-10-17 DIAGNOSIS — E1122 Type 2 diabetes mellitus with diabetic chronic kidney disease: Secondary | ICD-10-CM

## 2015-10-17 DIAGNOSIS — I10 Essential (primary) hypertension: Secondary | ICD-10-CM

## 2015-10-17 DIAGNOSIS — J4520 Mild intermittent asthma, uncomplicated: Secondary | ICD-10-CM

## 2015-10-17 LAB — COMPREHENSIVE METABOLIC PANEL
ALBUMIN: 4.2 g/dL (ref 3.5–5.2)
ALK PHOS: 50 U/L (ref 39–117)
ALT: 17 U/L (ref 0–35)
AST: 19 U/L (ref 0–37)
BILIRUBIN TOTAL: 0.4 mg/dL (ref 0.2–1.2)
BUN: 25 mg/dL — ABNORMAL HIGH (ref 6–23)
CALCIUM: 9.6 mg/dL (ref 8.4–10.5)
CO2: 25 mEq/L (ref 19–32)
Chloride: 110 mEq/L (ref 96–112)
Creatinine, Ser: 1.58 mg/dL — ABNORMAL HIGH (ref 0.40–1.20)
GFR: 33.21 mL/min — AB (ref >60.00)
GLUCOSE: 89 mg/dL (ref 70–99)
POTASSIUM: 3.9 meq/L (ref 3.5–5.1)
Sodium: 141 mEq/L (ref 135–145)
TOTAL PROTEIN: 7 g/dL (ref 6.0–8.3)

## 2015-10-17 LAB — HEMOGLOBIN A1C: HEMOGLOBIN A1C: 7.4 % — AB (ref 4.6–6.5)

## 2015-10-17 LAB — TSH: TSH: 2.98 u[IU]/mL (ref 0.35–4.50)

## 2015-10-17 MED ORDER — CYANOCOBALAMIN 1000 MCG/ML IJ SOLN
1000.0000 ug | Freq: Once | INTRAMUSCULAR | Status: AC
  Administered 2015-10-17: 1000 ug via INTRAMUSCULAR

## 2015-10-17 NOTE — Progress Notes (Signed)
Pre visit review using our clinic review tool, if applicable. No additional management support is needed unless otherwise documented below in the visit note. 

## 2015-10-17 NOTE — Progress Notes (Signed)
   Subjective:    Patient ID: Jocelyn Mendez, female    DOB: April 04, 1930, 82 y.o.   MRN: [id number]  HPI The patient is an 82 YO female coming in for follow up of her breathing (she is doing well, still using her albuterol as needed) and her blood pressure (doing better lately, not exercising, taking lasix and amlodipine, complicated by ckd stage 3). She is still seeing endo for her sugars and they have leveled out, however she is still struggling with finding a diet that does not raise her sugars. She did not bring her sugar log today.   Review of Systems  Constitutional: Negative for activity change, appetite change, fatigue, fever and unexpected weight change.  HENT: Negative.   Respiratory: Negative for cough, chest tightness, shortness of breath and wheezing.   Cardiovascular: Negative for chest pain, palpitations and leg swelling.  Gastrointestinal: Negative for abdominal distention, abdominal pain, constipation, diarrhea and nausea.  Musculoskeletal: Negative.   Skin: Negative.   Neurological: Negative.       Objective:   Physical Exam  Constitutional: She is oriented to person, place, and time. She appears well-developed and well-nourished.  HENT:  Head: Normocephalic and atraumatic.  Eyes: EOM are normal.  Neck: Normal range of motion.  Cardiovascular: Normal rate and regular rhythm.   Pulmonary/Chest: Effort normal and breath sounds normal. No respiratory distress. She has no wheezes.  Abdominal: Soft. Bowel sounds are normal. She exhibits no distension. There is no tenderness. There is no rebound.  Musculoskeletal: She exhibits no edema.  Neurological: She is alert and oriented to person, place, and time. Coordination normal.  Skin: Skin is warm and dry.   Vitals:   10/17/15 1056  BP: 132/68  Pulse: 99  Resp: 18  Temp: 98.8 F (37.1 C)  TempSrc: Oral  SpO2: 98%  Weight: 173 lb 6.4 oz (78.7 kg)  Height: 5\' 6"  (1.676 m)      Assessment & Plan:  B12 and flu shot  given at visit

## 2015-10-17 NOTE — Patient Instructions (Signed)
We are checking the blood work today and will call you back with the results.  Keep the appointment with Dr. Loanne Drilling and make sure to bring the sugar meter with you to that visit.   Diabetes and Standards of Medical Care Diabetes is complicated. You may find that your diabetes team includes a dietitian, nurse, diabetes educator, eye doctor, and more. To help everyone know what is going on and to help you get the care you deserve, the following schedule of care was developed to help keep you on track. Below are the tests, exams, vaccines, medicines, education, and plans you will need. HbA1c test This test shows how well you have controlled your glucose over the past 2-3 months. It is used to see if your diabetes management plan needs to be adjusted.   It is performed at least 2 times a year if you are meeting treatment goals.  It is performed 4 times a year if therapy has changed or if you are not meeting treatment goals. Blood pressure test  This test is performed at every routine medical visit. The goal is less than 140/90 mm Hg for most people, but 130/80 mm Hg in some cases. Ask your health care provider about your goal. Dental exam  Follow up with the dentist regularly. Eye exam  If you are diagnosed with type 1 diabetes as a child, get an exam upon reaching the age of 33 years or older and having had diabetes for 3-5 years. Yearly eye exams are recommended after that initial eye exam.  If you are diagnosed with type 1 diabetes as an adult, get an exam within 5 years of diagnosis and then yearly.  If you are diagnosed with type 2 diabetes, get an exam as soon as possible after the diagnosis and then yearly. Foot care exam  Visual foot exams are performed at every routine medical visit. The exams check for cuts, injuries, or other problems with the feet.  You should have a complete foot exam performed every year. This exam includes an inspection of the structure and skin of your feet,  a check of the pulses in your feet, and a check of the sensation in your feet.  Type 1 diabetes: The first exam is performed 5 years after diagnosis.  Type 2 diabetes: The first exam is performed at the time of diagnosis.  Check your feet nightly for cuts, injuries, or other problems with your feet. Tell your health care provider if anything is not healing. Kidney function test (urine microalbumin)  This test is performed once a year.  Type 1 diabetes: The first test is performed 5 years after diagnosis.  Type 2 diabetes: The first test is performed at the time of diagnosis.  A serum creatinine and estimated glomerular filtration rate (eGFR) test is done once a year to assess the level of chronic kidney disease (CKD), if present. Lipid profile (cholesterol, HDL, LDL, triglycerides)  Performed every 5 years for most people.  The goal for LDL is less than 100 mg/dL. If you are at high risk, the goal is less than 70 mg/dL.  The goal for HDL is 40 mg/dL-50 mg/dL for men and 50 mg/dL-60 mg/dL for women. An HDL cholesterol of 60 mg/dL or higher gives some protection against heart disease.  The goal for triglycerides is less than 150 mg/dL. Immunizations  The flu (influenza) vaccine is recommended yearly for every person 41 months of age or older who has diabetes.  The pneumonia (pneumococcal) vaccine  is recommended for every person 63 years of age or older who has diabetes. Adults 42 years of age or older may receive the pneumonia vaccine as a series of two separate shots.  The hepatitis B vaccine is recommended for adults shortly after they have been diagnosed with diabetes.  The Tdap (tetanus, diphtheria, and pertussis) vaccine should be given:  According to normal childhood vaccination schedules, for children.  Every 10 years, for adults who have diabetes. Diabetes self-management education  Education is recommended at diagnosis and ongoing as needed. Treatment plan  Your  treatment plan is reviewed at every medical visit.   This information is not intended to replace advice given to you by your health care provider. Make sure you discuss any questions you have with your health care provider.   Document Released: 11/24/2008 Document Revised: 02/17/2014 Document Reviewed: 06/29/2012 Elsevier Interactive Patient Education [employer].

## 2015-10-18 ENCOUNTER — Encounter: Payer: Self-pay | Admitting: Endocrinology

## 2015-10-18 ENCOUNTER — Ambulatory Visit (INDEPENDENT_AMBULATORY_CARE_PROVIDER_SITE_OTHER): Payer: Medicare Other | Admitting: Endocrinology

## 2015-10-18 VITALS — BP 142/70 | HR 107 | Wt 174.0 lb

## 2015-10-18 DIAGNOSIS — Z794 Long term (current) use of insulin: Secondary | ICD-10-CM | POA: Diagnosis not present

## 2015-10-18 DIAGNOSIS — N183 Chronic kidney disease, stage 3 unspecified: Secondary | ICD-10-CM

## 2015-10-18 DIAGNOSIS — E1122 Type 2 diabetes mellitus with diabetic chronic kidney disease: Secondary | ICD-10-CM | POA: Diagnosis not present

## 2015-10-18 MED ORDER — INSULIN ISOPHANE HUMAN 100 UNIT/ML KWIKPEN: 120.0000 [IU] | PEN_INJECTOR | SUBCUTANEOUS | 11 refills | Status: DC

## 2015-10-18 NOTE — Assessment & Plan Note (Signed)
BP at goal on her amlodipine and lasix. Checking labs today.

## 2015-10-18 NOTE — Assessment & Plan Note (Signed)
Checking TSH and adjust her synthroid 175 mcg daily as needed.

## 2015-10-18 NOTE — Progress Notes (Signed)
Subjective:    Patient ID: Jocelyn Mendez, female    DOB: 22-Aug-1930, 82 y.o.   MRN: [id number]  HPI Pt returns for f/u of diabetes mellitus: DM type: Insulin-requiring type 2 Dx'ed: 1988.   Complications: polyneuropathy and renal insuff.   Therapy: insulin since 2012.  GDM: never DKA: never Severe hypoglycemia: never.   Pancreatitis: never.   Other: she declines multiple daily injections; she uses pen, due to visual problems; on levemir, she had am hypoglycemia and pm hyperglycemia, so she was changed to QAM NPH.   Interval history: no recent steroids. no cbg record, but states cbg's are in the 100's.  There is no trend throughout the day.  pt states she feels well in general.  She seldom has hypoglycemia, and these episodes are mild.  She takes NPH, 120 units QAM Past Medical History:  Diagnosis Date  . Acute bronchitis   . Anemia of other chronic disease   . Anxiety state, unspecified   . Chest pain, unspecified   . Diverticulosis of colon (without mention of hemorrhage)   . Esophageal reflux   . Gallstones   . Generalized osteoarthrosis, unspecified site   . Headache(784.0)   . Lumbago   . Neuropathy in diabetes ([place])    bilat LE's  . Osteoporosis   . Other and unspecified hyperlipidemia   . Other B-complex deficiencies   . Type II or unspecified type diabetes mellitus without mention of complication, not stated as uncontrolled   . Unspecified disorder resulting from impaired renal function   . Unspecified essential hypertension   . Unspecified hypothyroidism     Past Surgical History:  Procedure Laterality Date  . APPENDECTOMY    . THYROIDECTOMY      Social History   Social History  . Marital status: Divorced    Spouse name: N/A  . Number of children: 6  . Years of education: N/A   Occupational History  . retired    Social History Main Topics  . Smoking status: Former Smoker    Packs/day: 0.50    Years: 6.00    Types: Cigarettes    Quit date:  02/10/1973  . Smokeless tobacco: Former Systems developer    Types: Snuff    Quit date: 04/07/2010  . Alcohol use No  . Drug use: No  . Sexual activity: No   Other Topics Concern  . Not on file   Social History Narrative   1 child passed away at 37 weeks old---?crib death   Dtr; [name]; Son lives with her [name]; [name]; roger;        Current Outpatient Prescriptions on File Prior to Visit  Medication Sig Dispense Refill  . albuterol (PROVENTIL HFA;VENTOLIN HFA) 108 (90 BASE) MCG/ACT inhaler Inhale 2 puffs into the lungs every 6 (six) hours as needed for wheezing or shortness of breath. 1 Inhaler 6  . amLODipine (NORVASC) 5 MG tablet TAKE 1 TABLET BY MOUTH DAILY - EMERGENCY REFILL FAXED DR 30 tablet 5  . aspirin 81 MG tablet Take 81 mg by mouth daily.      . Brinzolamide-Brimonidine (SIMBRINZA) 1-0.2 % SUSP Place 1 drop into both eyes 2 (two) times daily.    . Cholecalciferol (VITAMIN D3) 2000 UNITS TABS Take 1 tablet by mouth 2 (two) times daily.    . cyanocobalamin (,VITAMIN B-12,) 1000 MCG/ML injection Inject 1 mL (1,000 mcg total) into the muscle every 30 (thirty) days. 1 mL 11  . fenofibrate 160 MG tablet  TAKE 1 TABLET BY MOUTH DAILY - EMERGENCY REFILL FAXED DR 30 tablet 5  . ferrous sulfate 325 (65 FE) MG EC tablet Take 325 mg by mouth 2 (two) times daily.     . furosemide (LASIX) 20 MG tablet TAKE 1 TABLET BY MOUTH DAILY 30 tablet 1  . Insulin Pen Needle (BD PEN NEEDLE NANO U/F) 32G X 4 MM MISC USE AS DIRECTED WITH THE HUMULIN PEN INSULIN 100 each 3  . Insulin Pen Needle 31G X 8 MM MISC 1 each by Does not apply route. Use with insulin pen device, humulin    . Lancets (ONETOUCH ULTRASOFT) lancets Use as instructed 100 each 6  . levothyroxine (SYNTHROID, LEVOTHROID) 175 MCG tablet TAKE 1 TABLET BY MOUTH EVERY DAY BEFORE BREAKFAST 30 tablet 11  . NEEDLE, DISP, 25 G (BD DISP NEEDLES) 25G X 5/8" MISC Use as directed to administer  the b12 injection monthly 50 each 2  . ONE TOUCH ULTRA  TEST test strip USE TO TEST BLOOD SUGAR 4 TIMES DAILY DX: E10.22 100 each 3  . rosuvastatin (CRESTOR) 20 MG tablet TAKE 1 TABLET BY MOUTH DAILY 90 tablet 3  . [DISCONTINUED] insulin glargine (LANTUS SOLOSTAR) 100 UNIT/ML injection Inject 100 Units into the skin every morning. 45 mL PRN   No current facility-administered medications on file prior to visit.     Allergies  Allergen Reactions  . Codeine     REACTION: change in mental status  . Diphenhydramine Hcl     REACTION: itching  . Quinine     REACTION: abnormal sensations in face  . Sulfonamide Derivatives     REACTION: rash    Family History  Problem Relation Age of Onset  . Diabetes Mother   . Cancer Father     unsure ? stomach  . Heart attack Brother     x 3  . Cancer Sister     unsure type  . Lung cancer Brother     smoker  . Heart attack Sister     BP (!) 142/70   Pulse (!) 107   Wt 174 lb (78.9 kg)   SpO2 97%   BMI 28.08 kg/m    Review of Systems Denies LOC    Objective:   Physical Exam VITAL SIGNS:  See vs page.  GENERAL: no distress.  Pulses: dorsalis pedis intact bilat. MSK: no deformity of the feet.  CV: no leg edema.  Skin: no ulcer on the feet, but the skin is dry. normal color and temp on the feet.   Neuro: sensation is intact to touch on the feet.  Ext: There is bilateral onychomycosis of the toenails.    Lab Results  Component Value Date   HGBA1C 7.4 (H) 10/17/2015   Lab Results  Component Value Date   TSH 2.98 10/17/2015   Lab Results  Component Value Date   CREATININE 1.58 (H) 10/17/2015   BUN 25 (H) 10/17/2015   NA 141 10/17/2015   K 3.9 10/17/2015   CL 110 10/17/2015   CO2 25 10/17/2015   Lab Results  Component Value Date   CHOL 139 04/16/2015   HDL 26.60 (L) 04/16/2015   LDLCALC 85 03/19/2011   LDLDIRECT 74.0 04/16/2015   TRIG 334.0 (H) 04/16/2015   CHOLHDL 5 04/16/2015      Assessment & Plan:  Insulin-requiring type 2 DM: this is the best control this pt  should aim for, given this regimen, which does match insulin to her changing needs throughout  the day

## 2015-10-18 NOTE — Patient Instructions (Addendum)
check your blood sugar twice a day.  vary the time of day when you check, between before the 3 meals, and at bedtime.  also check if you have symptoms of your blood sugar being too high or too low.  please keep a record of the readings and bring it to your next appointment here.  You can write it on any piece of paper.  please call us sooner if your blood sugar goes below 70, or if you have a lot of readings over 200.  Please continue the same insulin. On this type of insulin schedule, you should eat meals on a regular schedule.  If a meal is missed or significantly delayed, your blood sugar could go low.   Please come back for a follow-up appointment in 4 months.   Please call us in a few days, to tell us how the blood sugar is doing.

## 2015-10-18 NOTE — Assessment & Plan Note (Signed)
Checking labs so when she sees endo they will be able to adjust as needed. Reminded her to take her sugar log to that visit and all visits.

## 2015-10-18 NOTE — Assessment & Plan Note (Signed)
Doing well and no flare today.

## 2015-11-07 ENCOUNTER — Other Ambulatory Visit: Payer: Self-pay | Admitting: *Deleted

## 2015-11-07 MED ORDER — FENOFIBRATE 160 MG PO TABS: ORAL_TABLET | ORAL | 5 refills | Status: DC

## 2015-11-07 MED ORDER — AMLODIPINE BESYLATE 5 MG PO TABS: ORAL_TABLET | ORAL | 5 refills | Status: DC

## 2015-12-12 ENCOUNTER — Other Ambulatory Visit: Payer: Self-pay | Admitting: Endocrinology

## 2016-01-23 DIAGNOSIS — H401131 Primary open-angle glaucoma, bilateral, mild stage: Secondary | ICD-10-CM | POA: Diagnosis not present

## 2016-02-07 ENCOUNTER — Other Ambulatory Visit: Payer: Self-pay | Admitting: Internal Medicine

## 2016-02-08 NOTE — Telephone Encounter (Signed)
Faxed to pharmacy

## 2016-02-17 NOTE — Progress Notes (Signed)
Subjective:    Patient ID: Jocelyn Mendez, female    DOB: 03-15-1930, 83 y.o.   MRN: [id number]  HPI Pt returns for f/u of diabetes mellitus: DM type: Insulin-requiring type 2 Dx'ed: 1988.   Complications: polyneuropathy and renal insuff.   Therapy: insulin since 2012.  GDM: never DKA: never Severe hypoglycemia: never.   Pancreatitis: never.   Other: she declines multiple daily injections; she uses pen, due to visual problems; on levemir, she had am hypoglycemia and pm hyperglycemia, so she was changed to QAM NPH.   Interval history: no recent steroids. no cbg record, but states cbg's are well-controlled.  It is in general higher as the day goes on.  pt states she feels well in general.  She seldom has hypoglycemia, and these episodes are mild.  This happens when she misses a meal.  She takes NPH, 120 units QAM.  Past Medical History:  Diagnosis Date  . Acute bronchitis   . Anemia of other chronic disease   . Anxiety state, unspecified   . Chest pain, unspecified   . Diverticulosis of colon (without mention of hemorrhage)   . Esophageal reflux   . Gallstones   . Generalized osteoarthrosis, unspecified site   . Headache(784.0)   . Lumbago   . Neuropathy in diabetes ([place])    bilat LE's  . Osteoporosis   . Other and unspecified hyperlipidemia   . Other B-complex deficiencies   . Type II or unspecified type diabetes mellitus without mention of complication, not stated as uncontrolled   . Unspecified disorder resulting from impaired renal function   . Unspecified essential hypertension   . Unspecified hypothyroidism     Past Surgical History:  Procedure Laterality Date  . APPENDECTOMY    . THYROIDECTOMY      Social History   Social History  . Marital status: Divorced    Spouse name: N/A  . Number of children: 6  . Years of education: N/A   Occupational History  . retired    Social History Main Topics  . Smoking status: Former Smoker    Packs/day: 0.50   Years: 6.00    Types: Cigarettes    Quit date: 02/10/1973  . Smokeless tobacco: Former Systems developer    Types: Snuff    Quit date: 04/07/2010  . Alcohol use No  . Drug use: No  . Sexual activity: No   Other Topics Concern  . Not on file   Social History Narrative   1 child passed away at 48 weeks old---?crib death   Dtr; [name]; Son lives with her [name]; [name]; roger;        Current Outpatient Prescriptions on File Prior to Visit  Medication Sig Dispense Refill  . albuterol (PROVENTIL HFA;VENTOLIN HFA) 108 (90 BASE) MCG/ACT inhaler Inhale 2 puffs into the lungs every 6 (six) hours as needed for wheezing or shortness of breath. 1 Inhaler 6  . amLODipine (NORVASC) 5 MG tablet TAKE 1 TABLET BY MOUTH DAILY 30 tablet 5  . aspirin 81 MG tablet Take 81 mg by mouth daily.      . Brinzolamide-Brimonidine (SIMBRINZA) 1-0.2 % SUSP Place 1 drop into both eyes 2 (two) times daily.    . Cholecalciferol (VITAMIN D3) 2000 UNITS TABS Take 1 tablet by mouth 2 (two) times daily.    . cyanocobalamin (,VITAMIN B-12,) 1000 MCG/ML injection INJECT 1ML INTO THE MUSCLE ONCE A MONTH 1 mL 11  . fenofibrate 160 MG tablet TAKE 1  TABLET BY MOUTH DAILY 30 tablet 5  . ferrous sulfate 325 (65 FE) MG EC tablet Take 325 mg by mouth 2 (two) times daily.     . furosemide (LASIX) 20 MG tablet TAKE 1 TABLET BY MOUTH DAILY 30 tablet 1  . Insulin NPH, Human,, Isophane, (HUMULIN N) 100 UNIT/ML Kiwkpen Inject 120 Units into the skin every morning. 45 mL 11  . Insulin Pen Needle (BD PEN NEEDLE NANO U/F) 32G X 4 MM MISC USE AS DIRECTED WITH THE HUMULIN PEN INSULIN 100 each 3  . Insulin Pen Needle 31G X 8 MM MISC 1 each by Does not apply route. Use with insulin pen device, humulin    . Lancets (ONETOUCH ULTRASOFT) lancets Use as instructed 100 each 6  . levothyroxine (SYNTHROID, LEVOTHROID) 175 MCG tablet TAKE 1 TABLET BY MOUTH EVERY DAY BEFORE BREAKFAST 30 tablet 11  . NEEDLE, DISP, 25 G (BD DISP NEEDLES) 25G X 5/8" MISC  Use as directed to administer  the b12 injection monthly 50 each 2  . ONE TOUCH ULTRA TEST test strip USE TO TEST BLOOD SUGAR 4 TIMES DAILY DX: E10.22 100 each 3  . rosuvastatin (CRESTOR) 20 MG tablet TAKE 1 TABLET BY MOUTH DAILY 90 tablet 3  . [DISCONTINUED] insulin glargine (LANTUS SOLOSTAR) 100 UNIT/ML injection Inject 100 Units into the skin every morning. 45 mL PRN   No current facility-administered medications on file prior to visit.     Allergies  Allergen Reactions  . Codeine     REACTION: change in mental status  . Diphenhydramine Hcl     REACTION: itching  . Quinine     REACTION: abnormal sensations in face  . Sulfonamide Derivatives     REACTION: rash    Family History  Problem Relation Age of Onset  . Diabetes Mother   . Cancer Father     unsure ? stomach  . Heart attack Brother     x 3  . Cancer Sister     unsure type  . Lung cancer Brother     smoker  . Heart attack Sister    BP 132/60   Pulse 93   Ht 5\' 6"  (1.676 m)   Wt 175 lb (79.4 kg)   SpO2 95%   BMI 28.25 kg/m   Review of Systems She denies hypoglycemia.      Objective:   Physical Exam VITAL SIGNS:  See vs page.  GENERAL: no distress.  Pulses: dorsalis pedis intact bilat. MSK: no deformity of the feet.  CV: no leg edema, but there are bilat vv's.  Skin: no ulcer on the feet, but the skin is dry. normal color and temp on the feet.   Neuro: sensation is intact to touch on the feet.  Ext: There is bilateral onychomycosis of the toenails.    A1c=7.4%.      Assessment & Plan:  Insulin-requiring type 2 DM, with renal insufficiency: this is the best control this pt should aim for, given this regimen, which does match insulin to her changing needs throughout the day.  Patient is advised the following: Patient Instructions  check your blood sugar twice a day.  vary the time of day when you check, between before the 3 meals, and at bedtime.  also check if you have symptoms of your blood  sugar being too high or too low.  please keep a record of the readings and bring it to your next appointment here.  You can write it on any  piece of paper.  please call us sooner if your blood sugar goes below 70, or if you have a lot of readings over 200.  Please continue the same insulin. On this type of insulin schedule, you should eat meals on a regular schedule.  If a meal is missed or significantly delayed, your blood sugar could go low.   Please come back for a follow-up appointment in 4 months.

## 2016-02-18 ENCOUNTER — Ambulatory Visit (INDEPENDENT_AMBULATORY_CARE_PROVIDER_SITE_OTHER): Payer: Medicare Other | Admitting: Endocrinology

## 2016-02-18 ENCOUNTER — Encounter: Payer: Self-pay | Admitting: Endocrinology

## 2016-02-18 VITALS — BP 132/60 | HR 93 | Ht 66.0 in | Wt 175.0 lb

## 2016-02-18 DIAGNOSIS — E1122 Type 2 diabetes mellitus with diabetic chronic kidney disease: Secondary | ICD-10-CM | POA: Diagnosis not present

## 2016-02-18 DIAGNOSIS — N183 Chronic kidney disease, stage 3 unspecified: Secondary | ICD-10-CM

## 2016-02-18 DIAGNOSIS — E538 Deficiency of other specified B group vitamins: Secondary | ICD-10-CM | POA: Diagnosis not present

## 2016-02-18 DIAGNOSIS — Z794 Long term (current) use of insulin: Secondary | ICD-10-CM

## 2016-02-18 LAB — POCT GLYCOSYLATED HEMOGLOBIN (HGB A1C): HEMOGLOBIN A1C: 7.4

## 2016-02-18 MED ORDER — CYANOCOBALAMIN 1000 MCG/ML IJ SOLN
1000.0000 ug | Freq: Once | INTRAMUSCULAR | Status: AC
  Administered 2016-02-18: 1000 ug via INTRAMUSCULAR

## 2016-02-18 NOTE — Patient Instructions (Signed)
check your blood sugar twice a day.  vary the time of day when you check, between before the 3 meals, and at bedtime.  also check if you have symptoms of your blood sugar being too high or too low.  please keep a record of the readings and bring it to your next appointment here.  You can write it on any piece of paper.  please call us sooner if your blood sugar goes below 70, or if you have a lot of readings over 200.  Please continue the same insulin. On this type of insulin schedule, you should eat meals on a regular schedule.  If a meal is missed or significantly delayed, your blood sugar could go low.   Please come back for a follow-up appointment in 4 months.

## 2016-04-02 ENCOUNTER — Telehealth: Payer: Self-pay

## 2016-04-02 MED ORDER — ROSUVASTATIN CALCIUM 20 MG PO TABS: 20.0000 mg | ORAL_TABLET | Freq: Every day | ORAL | 1 refills | Status: DC

## 2016-04-02 NOTE — Telephone Encounter (Signed)
Medication refilled

## 2016-04-05 ENCOUNTER — Other Ambulatory Visit: Payer: Self-pay | Admitting: Endocrinology

## 2016-04-15 ENCOUNTER — Ambulatory Visit (INDEPENDENT_AMBULATORY_CARE_PROVIDER_SITE_OTHER): Payer: Medicare Other | Admitting: Internal Medicine

## 2016-04-15 ENCOUNTER — Encounter: Payer: Self-pay | Admitting: Internal Medicine

## 2016-04-15 ENCOUNTER — Other Ambulatory Visit (INDEPENDENT_AMBULATORY_CARE_PROVIDER_SITE_OTHER): Payer: Medicare Other

## 2016-04-15 VITALS — BP 142/68 | HR 104 | Temp 98.1°F | Ht 66.0 in | Wt 172.0 lb

## 2016-04-15 DIAGNOSIS — E785 Hyperlipidemia, unspecified: Secondary | ICD-10-CM

## 2016-04-15 DIAGNOSIS — E611 Iron deficiency: Secondary | ICD-10-CM

## 2016-04-15 DIAGNOSIS — E890 Postprocedural hypothyroidism: Secondary | ICD-10-CM

## 2016-04-15 DIAGNOSIS — N183 Chronic kidney disease, stage 3 unspecified: Secondary | ICD-10-CM

## 2016-04-15 DIAGNOSIS — R1031 Right lower quadrant pain: Secondary | ICD-10-CM

## 2016-04-15 DIAGNOSIS — E538 Deficiency of other specified B group vitamins: Secondary | ICD-10-CM

## 2016-04-15 LAB — CBC
HCT: 37.9 % (ref 36.0–46.0)
Hemoglobin: 12.8 g/dL (ref 12.0–15.0)
MCHC: 33.9 g/dL (ref 30.0–36.0)
MCV: 90.6 fl (ref 78.0–100.0)
Platelets: 229 10*3/uL (ref 150.0–400.0)
RBC: 4.19 Mil/uL (ref 3.87–5.11)
RDW: 12.7 % (ref 11.5–15.5)
WBC: 6.1 10*3/uL (ref 4.0–10.5)

## 2016-04-15 LAB — LIPID PANEL
CHOL/HDL RATIO: 5
CHOLESTEROL: 118 mg/dL (ref 0–200)
HDL: 22.6 mg/dL — ABNORMAL LOW (ref >39.00)
NonHDL: 95.44
Triglycerides: 301 mg/dL — ABNORMAL HIGH (ref 0.0–149.0)
VLDL: 60.2 mg/dL — ABNORMAL HIGH (ref 0.0–40.0)

## 2016-04-15 LAB — COMPREHENSIVE METABOLIC PANEL
ALT: 13 U/L (ref 0–35)
AST: 18 U/L (ref 0–37)
Albumin: 4.1 g/dL (ref 3.5–5.2)
Alkaline Phosphatase: 60 U/L (ref 39–117)
BILIRUBIN TOTAL: 0.3 mg/dL (ref 0.2–1.2)
BUN: 33 mg/dL — AB (ref 6–23)
CALCIUM: 9.6 mg/dL (ref 8.4–10.5)
CO2: 24 mEq/L (ref 19–32)
CREATININE: 1.63 mg/dL — AB (ref 0.40–1.20)
Chloride: 109 mEq/L (ref 96–112)
GFR: 32 mL/min — ABNORMAL LOW (ref >60.00)
Glucose, Bld: 137 mg/dL — ABNORMAL HIGH (ref 70–99)
Potassium: 4 mEq/L (ref 3.5–5.1)
Sodium: 141 mEq/L (ref 135–145)
Total Protein: 6.9 g/dL (ref 6.0–8.3)

## 2016-04-15 LAB — TSH: TSH: 0.98 u[IU]/mL (ref 0.35–4.50)

## 2016-04-15 LAB — LDL CHOLESTEROL, DIRECT: Direct LDL: 58 mg/dL

## 2016-04-15 MED ORDER — CYANOCOBALAMIN 1000 MCG/ML IJ SOLN
1000.0000 ug | Freq: Once | INTRAMUSCULAR | Status: AC
  Administered 2016-04-15: 1000 ug via INTRAMUSCULAR

## 2016-04-15 MED ORDER — DOCUSATE SODIUM 100 MG PO CAPS: 100.0000 mg | ORAL_CAPSULE | Freq: Two times a day (BID) | ORAL | 6 refills | Status: DC

## 2016-04-15 NOTE — Assessment & Plan Note (Signed)
Checking TSH due to her other co-morbidities needs closer monitoring. Taking synthroid 175 mcg daily and adjust as needed. Some worsening with dry skin but no other symptoms of over or under replacement.

## 2016-04-15 NOTE — Progress Notes (Signed)
Pre visit review using our clinic review tool, if applicable. No additional management support is needed unless otherwise documented below in the visit note. 

## 2016-04-15 NOTE — Assessment & Plan Note (Signed)
Does home injections or at the office if she is present for visit. Given B12 injection today and true low levels in the past. Needs to continue indefinitely.

## 2016-04-15 NOTE — Assessment & Plan Note (Signed)
Rx for colace to help with painful stools, she does drink plenty of water but likely needs to eat more fiber.

## 2016-04-15 NOTE — Patient Instructions (Signed)
We are checking the labs today and will call you back with the results.    

## 2016-04-15 NOTE — Progress Notes (Signed)
   Subjective:    Patient ID: Jocelyn Mendez, female    DOB: [birth year], 83 y.o.   MRN: [id number]  HPI The patient is an 83 YO female coming in for follow up of her cholesterol (needs lipid panel, taking crestor daily without side effects, goal LDL <100 or <70 ideal with her diabetes) and her thyroid (taking her synthroid 175 mcg daily and no new symptoms, denies cold or heat intolerance, denies constipation but having hard stools, no diarrhea, no tremors), and her CKD (BP and diabetes adequately controlled, has been stable for some time, not on ACE-I or ARB due to prior AKI with illness and she has not been on for some time). She denies new problems at this time, some increase in dry skin.   Review of Systems  Constitutional: Positive for fatigue. Negative for activity change, appetite change, chills, fever and unexpected weight change.  HENT: Negative.   Eyes: Negative.   Respiratory: Negative for cough, chest tightness, shortness of breath and wheezing.   Cardiovascular: Negative.   Gastrointestinal: Negative for abdominal distention, abdominal pain, constipation, diarrhea, nausea and vomiting.       Hard stools  Musculoskeletal: Positive for arthralgias, back pain and myalgias. Negative for gait problem and joint swelling.  Skin: Negative.   Neurological: Negative.   Psychiatric/Behavioral: Negative.       Objective:   Physical Exam  Constitutional: She is oriented to person, place, and time. She appears well-developed and well-nourished.  HENT:  Head: Normocephalic and atraumatic.  Eyes: EOM are normal.  Neck: Normal range of motion.  Cardiovascular: Normal rate and regular rhythm.   Pulmonary/Chest: Effort normal. No respiratory distress. She has no wheezes. She has no rales.  Lung exam stable.   Abdominal: Soft. Bowel sounds are normal. She exhibits no distension. There is no tenderness. There is no rebound and no guarding.  Musculoskeletal: She exhibits no edema.    Neurological: She is alert and oriented to person, place, and time. Coordination normal.  Skin: Skin is warm and dry.   Vitals:   04/15/16 1040  BP: (!) 142/68  Pulse: (!) 104  Temp: 98.1 F (36.7 C)  TempSrc: Oral  SpO2: 98%  Weight: 172 lb (78 kg)  Height: 5\' 6"  (1.676 m)      Assessment & Plan:  B12 shot given at visit.

## 2016-04-15 NOTE — Assessment & Plan Note (Signed)
Goal LDL <100 or ideal <70. She is taking crestor 20 mg daily and no side effects. Checking lipid panel and adjust as needed.

## 2016-04-15 NOTE — Assessment & Plan Note (Signed)
Checking CMP for stability. She is on appropriate diabetes and BP therapy.

## 2016-05-06 ENCOUNTER — Other Ambulatory Visit: Payer: Self-pay

## 2016-05-06 MED ORDER — FENOFIBRATE 160 MG PO TABS: ORAL_TABLET | ORAL | 5 refills | Status: DC

## 2016-05-06 NOTE — Telephone Encounter (Signed)
Go paper refill request for medication, Prescribed by Burns. Please advise

## 2016-06-09 ENCOUNTER — Other Ambulatory Visit: Payer: Self-pay | Admitting: Internal Medicine

## 2016-06-09 NOTE — Telephone Encounter (Signed)
Burns prescribed last time

## 2016-06-17 ENCOUNTER — Ambulatory Visit: Payer: Medicare Other | Admitting: Endocrinology

## 2016-06-29 ENCOUNTER — Other Ambulatory Visit: Payer: Self-pay | Admitting: Endocrinology

## 2016-07-20 ENCOUNTER — Other Ambulatory Visit: Payer: Self-pay | Admitting: Endocrinology

## 2016-08-04 ENCOUNTER — Other Ambulatory Visit: Payer: Self-pay | Admitting: Endocrinology

## 2016-08-04 ENCOUNTER — Encounter: Payer: Self-pay | Admitting: Endocrinology

## 2016-08-04 ENCOUNTER — Ambulatory Visit (INDEPENDENT_AMBULATORY_CARE_PROVIDER_SITE_OTHER): Payer: Medicare Other | Admitting: Endocrinology

## 2016-08-04 VITALS — BP 122/72 | HR 106 | Ht 66.0 in | Wt 170.0 lb

## 2016-08-04 DIAGNOSIS — N183 Chronic kidney disease, stage 3 (moderate): Secondary | ICD-10-CM | POA: Diagnosis not present

## 2016-08-04 DIAGNOSIS — E538 Deficiency of other specified B group vitamins: Secondary | ICD-10-CM | POA: Diagnosis not present

## 2016-08-04 DIAGNOSIS — Z794 Long term (current) use of insulin: Secondary | ICD-10-CM | POA: Diagnosis not present

## 2016-08-04 DIAGNOSIS — E1122 Type 2 diabetes mellitus with diabetic chronic kidney disease: Secondary | ICD-10-CM

## 2016-08-04 LAB — POCT GLYCOSYLATED HEMOGLOBIN (HGB A1C): Hemoglobin A1C: 7.8

## 2016-08-04 MED ORDER — INSULIN ISOPHANE HUMAN 100 UNIT/ML KWIKPEN: 110.0000 [IU] | PEN_INJECTOR | SUBCUTANEOUS | 11 refills | Status: DC

## 2016-08-04 MED ORDER — CYANOCOBALAMIN 1000 MCG/ML IJ SOLN
1000.0000 ug | Freq: Once | INTRAMUSCULAR | Status: AC
  Administered 2016-08-04: 1000 ug via INTRAMUSCULAR

## 2016-08-04 NOTE — Progress Notes (Signed)
Subjective:    Patient ID: Jocelyn Mendez, female    DOB: 1931-01-22, 83 y.o.   MRN: [id number]  HPI Pt returns for f/u of diabetes mellitus: DM type: Insulin-requiring type 2 Dx'ed: 1988.   Complications: polyneuropathy and renal insuff.   Therapy: insulin since 2012.  GDM: never DKA: never Severe hypoglycemia: never.   Pancreatitis: never.   Other: she declines multiple daily injections; she uses pen, due to visual problems; on levemir, she had am hypoglycemia and pm hyperglycemia, so she was changed to QAM NPH.   Interval history: no recent steroids. no cbg record, but states cbg's are well-controlled.  It is in general higher as the day goes on.  pt states she feels well in general.  She seldom has hypoglycemia, and these episodes are mild.  This happens fasting.  She takes NPH, 120 units QAM.  She says she needs to eat at hs to avoid AM hypoglycemia.  Past Medical History:  Diagnosis Date  . Acute bronchitis   . Anemia of other chronic disease   . Anxiety state, unspecified   . Chest pain, unspecified   . Diverticulosis of colon (without mention of hemorrhage)   . Esophageal reflux   . Gallstones   . Generalized osteoarthrosis, unspecified site   . Headache(784.0)   . Lumbago   . Neuropathy in diabetes ([place])    bilat LE's  . Osteoporosis   . Other and unspecified hyperlipidemia   . Other B-complex deficiencies   . Type II or unspecified type diabetes mellitus without mention of complication, not stated as uncontrolled   . Unspecified disorder resulting from impaired renal function   . Unspecified essential hypertension   . Unspecified hypothyroidism     Past Surgical History:  Procedure Laterality Date  . APPENDECTOMY    . THYROIDECTOMY      Social History   Social History  . Marital status: Divorced    Spouse name: N/A  . Number of children: 6  . Years of education: N/A   Occupational History  . retired    Social History Main Topics  . Smoking  status: Former Smoker    Packs/day: 0.50    Years: 6.00    Types: Cigarettes    Quit date: 02/10/1973  . Smokeless tobacco: Former Systems developer    Types: Snuff    Quit date: 04/07/2010  . Alcohol use No  . Drug use: No  . Sexual activity: No   Other Topics Concern  . Not on file   Social History Narrative   1 child passed away at 23 weeks old---?crib death   Dtr; [name]; Son lives with her [name]; [name]; roger;        Current Outpatient Prescriptions on File Prior to Visit  Medication Sig Dispense Refill  . albuterol (PROVENTIL HFA;VENTOLIN HFA) 108 (90 BASE) MCG/ACT inhaler Inhale 2 puffs into the lungs every 6 (six) hours as needed for wheezing or shortness of breath. 1 Inhaler 6  . amLODipine (NORVASC) 5 MG tablet TAKE 1 TABLET BY MOUTH DAILY 90 tablet 1  . aspirin 81 MG tablet Take 81 mg by mouth daily.      . Brinzolamide-Brimonidine (SIMBRINZA) 1-0.2 % SUSP Place 1 drop into both eyes 2 (two) times daily.    . Cholecalciferol (VITAMIN D3) 2000 UNITS TABS Take 1 tablet by mouth 2 (two) times daily.    . cyanocobalamin (,VITAMIN B-12,) 1000 MCG/ML injection INJECT 1ML INTO THE MUSCLE ONCE A MONTH  1 mL 11  . docusate sodium (COLACE) 100 MG capsule Take 1 capsule (100 mg total) by mouth 2 (two) times daily. 60 capsule 6  . fenofibrate 160 MG tablet TAKE 1 TABLET BY MOUTH DAILY 30 tablet 5  . ferrous sulfate 325 (65 FE) MG EC tablet Take 325 mg by mouth 2 (two) times daily.     . furosemide (LASIX) 20 MG tablet TAKE 1 TABLET BY MOUTH DAILY 30 tablet 1  . Insulin Pen Needle 31G X 8 MM MISC 1 each by Does not apply route. Use with insulin pen device, humulin    . Lancets (ONETOUCH ULTRASOFT) lancets Use as instructed 100 each 6  . levothyroxine (SYNTHROID, LEVOTHROID) 175 MCG tablet TAKE 1 TABLET BY MOUTH EVERY DAY BEFORE BREAKFAST 90 tablet 3  . NEEDLE, DISP, 25 G (BD DISP NEEDLES) 25G X 5/8" MISC Use as directed to administer  the b12 injection monthly 50 each 2  . ONE TOUCH  ULTRA TEST test strip USE TO TEST BLOOD SUGAR 4 TIMES DAILY DX: E10.22 100 each 2  . rosuvastatin (CRESTOR) 20 MG tablet Take 1 tablet (20 mg total) by mouth daily. 90 tablet 1  . [DISCONTINUED] insulin glargine (LANTUS SOLOSTAR) 100 UNIT/ML injection Inject 100 Units into the skin every morning. 45 mL PRN   No current facility-administered medications on file prior to visit.     Allergies  Allergen Reactions  . Codeine     REACTION: change in mental status  . Diphenhydramine Hcl     REACTION: itching  . Quinine     REACTION: abnormal sensations in face  . Sulfonamide Derivatives     REACTION: rash    Family History  Problem Relation Age of Onset  . Diabetes Mother   . Cancer Father        unsure ? stomach  . Heart attack Brother        x 3  . Cancer Sister        unsure type  . Lung cancer Brother        smoker  . Heart attack Sister     BP 122/72   Pulse (!) 106   Ht 5\' 6"  (1.676 m)   Wt 170 lb (77.1 kg)   SpO2 98%   BMI 27.44 kg/m    Review of Systems Denies LOC.      Objective:   Physical Exam VITAL SIGNS:  See vs page.  GENERAL: no distress.  Pulses: dorsalis pedis intact bilat. MSK: no deformity of the feet.  CV: no leg edema, but there are bilat vv's.  Skin: no ulcer on the feet, but the skin is dry. normal color and temp on the feet.   Neuro: sensation is intact to touch on the feet.  Ext: There is bilateral onychomycosis of the toenails.   Lab Results  Component Value Date   HGBA1C 7.8 08/04/2016      Assessment & Plan:  Insulin-requiring type 2 DM, with polyneuropathy, worse: this is the best control this pt should aim for, given this regimen, which does match insulin to her changing needs throughout the day Renal insuff: she has long duration of action of insulin.  We discussed.  She declines to change to 70/30.   Patient Instructions  check your blood sugar twice a day.  vary the time of day when you check, between before the 3 meals,  and at bedtime.  also check if you have symptoms of your blood sugar being  too high or too low.  please keep a record of the readings and bring it to your next appointment here.  You can write it on any piece of paper.  please call us sooner if your blood sugar goes below 70, or if you have a lot of readings over 200.  Please the insulin to 110 units each morning. On this type of insulin schedule, you should eat meals on a regular schedule.  If a meal is missed or significantly delayed, your blood sugar could go low.   Please come back for a follow-up appointment in 4 months.

## 2016-08-04 NOTE — Patient Instructions (Signed)
check your blood sugar twice a day.  vary the time of day when you check, between before the 3 meals, and at bedtime.  also check if you have symptoms of your blood sugar being too high or too low.  please keep a record of the readings and bring it to your next appointment here.  You can write it on any piece of paper.  please call us sooner if your blood sugar goes below 70, or if you have a lot of readings over 200.  Please the insulin to 110 units each morning. On this type of insulin schedule, you should eat meals on a regular schedule.  If a meal is missed or significantly delayed, your blood sugar could go low.   Please come back for a follow-up appointment in 4 months.

## 2016-08-11 ENCOUNTER — Ambulatory Visit: Payer: Medicare Other | Admitting: Internal Medicine

## 2016-08-12 DIAGNOSIS — H401131 Primary open-angle glaucoma, bilateral, mild stage: Secondary | ICD-10-CM | POA: Diagnosis not present

## 2016-08-23 ENCOUNTER — Observation Stay (HOSPITAL_COMMUNITY): Payer: Medicare Other

## 2016-08-23 ENCOUNTER — Inpatient Hospital Stay (HOSPITAL_COMMUNITY)
Admission: EM | Admit: 2016-08-23 | Discharge: 2016-08-26 | DRG: 871 | Disposition: A | Discharge status: Home Health | Payer: Medicare Other | Attending: Internal Medicine | Admitting: Internal Medicine

## 2016-08-23 ENCOUNTER — Emergency Department (HOSPITAL_COMMUNITY): Payer: Medicare Other

## 2016-08-23 ENCOUNTER — Encounter (HOSPITAL_COMMUNITY): Payer: Self-pay | Admitting: Oncology

## 2016-08-23 DIAGNOSIS — R531 Weakness: Secondary | ICD-10-CM

## 2016-08-23 DIAGNOSIS — R05 Cough: Secondary | ICD-10-CM | POA: Diagnosis not present

## 2016-08-23 DIAGNOSIS — Z794 Long term (current) use of insulin: Secondary | ICD-10-CM

## 2016-08-23 DIAGNOSIS — E1122 Type 2 diabetes mellitus with diabetic chronic kidney disease: Secondary | ICD-10-CM | POA: Diagnosis present

## 2016-08-23 DIAGNOSIS — Z23 Encounter for immunization: Secondary | ICD-10-CM

## 2016-08-23 DIAGNOSIS — E1140 Type 2 diabetes mellitus with diabetic neuropathy, unspecified: Secondary | ICD-10-CM | POA: Diagnosis not present

## 2016-08-23 DIAGNOSIS — Z888 Allergy status to other drugs, medicaments and biological substances status: Secondary | ICD-10-CM

## 2016-08-23 DIAGNOSIS — J90 Pleural effusion, not elsewhere classified: Secondary | ICD-10-CM | POA: Diagnosis not present

## 2016-08-23 DIAGNOSIS — R509 Fever, unspecified: Secondary | ICD-10-CM | POA: Diagnosis not present

## 2016-08-23 DIAGNOSIS — E669 Obesity, unspecified: Secondary | ICD-10-CM | POA: Diagnosis present

## 2016-08-23 DIAGNOSIS — D638 Anemia in other chronic diseases classified elsewhere: Secondary | ICD-10-CM | POA: Diagnosis present

## 2016-08-23 DIAGNOSIS — R251 Tremor, unspecified: Secondary | ICD-10-CM | POA: Diagnosis present

## 2016-08-23 DIAGNOSIS — J329 Chronic sinusitis, unspecified: Secondary | ICD-10-CM | POA: Diagnosis not present

## 2016-08-23 DIAGNOSIS — Z79890 Hormone replacement therapy (postmenopausal): Secondary | ICD-10-CM

## 2016-08-23 DIAGNOSIS — Z79899 Other long term (current) drug therapy: Secondary | ICD-10-CM

## 2016-08-23 DIAGNOSIS — J45909 Unspecified asthma, uncomplicated: Secondary | ICD-10-CM | POA: Diagnosis not present

## 2016-08-23 DIAGNOSIS — J9811 Atelectasis: Secondary | ICD-10-CM | POA: Diagnosis present

## 2016-08-23 DIAGNOSIS — N183 Chronic kidney disease, stage 3 (moderate): Secondary | ICD-10-CM | POA: Diagnosis not present

## 2016-08-23 DIAGNOSIS — E860 Dehydration: Secondary | ICD-10-CM | POA: Diagnosis not present

## 2016-08-23 DIAGNOSIS — K219 Gastro-esophageal reflux disease without esophagitis: Secondary | ICD-10-CM | POA: Diagnosis present

## 2016-08-23 DIAGNOSIS — R627 Adult failure to thrive: Secondary | ICD-10-CM

## 2016-08-23 DIAGNOSIS — F411 Generalized anxiety disorder: Secondary | ICD-10-CM | POA: Diagnosis present

## 2016-08-23 DIAGNOSIS — Z9049 Acquired absence of other specified parts of digestive tract: Secondary | ICD-10-CM

## 2016-08-23 DIAGNOSIS — Z5329 Procedure and treatment not carried out because of patient's decision for other reasons: Secondary | ICD-10-CM | POA: Diagnosis present

## 2016-08-23 DIAGNOSIS — R410 Disorientation, unspecified: Secondary | ICD-10-CM

## 2016-08-23 DIAGNOSIS — I129 Hypertensive chronic kidney disease with stage 1 through stage 4 chronic kidney disease, or unspecified chronic kidney disease: Secondary | ICD-10-CM | POA: Diagnosis present

## 2016-08-23 DIAGNOSIS — E785 Hyperlipidemia, unspecified: Secondary | ICD-10-CM | POA: Diagnosis present

## 2016-08-23 DIAGNOSIS — E039 Hypothyroidism, unspecified: Secondary | ICD-10-CM | POA: Diagnosis present

## 2016-08-23 DIAGNOSIS — K8020 Calculus of gallbladder without cholecystitis without obstruction: Secondary | ICD-10-CM | POA: Diagnosis not present

## 2016-08-23 DIAGNOSIS — A4151 Sepsis due to Escherichia coli [E. coli]: Secondary | ICD-10-CM | POA: Diagnosis not present

## 2016-08-23 DIAGNOSIS — Z87891 Personal history of nicotine dependence: Secondary | ICD-10-CM

## 2016-08-23 DIAGNOSIS — Z7982 Long term (current) use of aspirin: Secondary | ICD-10-CM

## 2016-08-23 DIAGNOSIS — G928 Other toxic encephalopathy: Secondary | ICD-10-CM | POA: Diagnosis present

## 2016-08-23 DIAGNOSIS — Z833 Family history of diabetes mellitus: Secondary | ICD-10-CM

## 2016-08-23 DIAGNOSIS — R269 Unspecified abnormalities of gait and mobility: Secondary | ICD-10-CM | POA: Diagnosis not present

## 2016-08-23 DIAGNOSIS — Z6828 Body mass index (BMI) 28.0-28.9, adult: Secondary | ICD-10-CM

## 2016-08-23 DIAGNOSIS — K5750 Diverticulosis of both small and large intestine without perforation or abscess without bleeding: Secondary | ICD-10-CM | POA: Diagnosis not present

## 2016-08-23 DIAGNOSIS — G9340 Encephalopathy, unspecified: Secondary | ICD-10-CM | POA: Diagnosis not present

## 2016-08-23 DIAGNOSIS — A419 Sepsis, unspecified organism: Principal | ICD-10-CM | POA: Diagnosis present

## 2016-08-23 DIAGNOSIS — Z882 Allergy status to sulfonamides status: Secondary | ICD-10-CM

## 2016-08-23 DIAGNOSIS — Z8249 Family history of ischemic heart disease and other diseases of the circulatory system: Secondary | ICD-10-CM

## 2016-08-23 DIAGNOSIS — E119 Type 2 diabetes mellitus without complications: Secondary | ICD-10-CM | POA: Diagnosis not present

## 2016-08-23 DIAGNOSIS — N3000 Acute cystitis without hematuria: Secondary | ICD-10-CM | POA: Diagnosis present

## 2016-08-23 DIAGNOSIS — M810 Age-related osteoporosis without current pathological fracture: Secondary | ICD-10-CM | POA: Diagnosis present

## 2016-08-23 DIAGNOSIS — Z885 Allergy status to narcotic agent status: Secondary | ICD-10-CM

## 2016-08-23 DIAGNOSIS — G9341 Metabolic encephalopathy: Secondary | ICD-10-CM | POA: Diagnosis present

## 2016-08-23 DIAGNOSIS — Z801 Family history of malignant neoplasm of trachea, bronchus and lung: Secondary | ICD-10-CM

## 2016-08-23 LAB — URINALYSIS, ROUTINE W REFLEX MICROSCOPIC
BILIRUBIN URINE: NEGATIVE
Glucose, UA: 50 mg/dL — AB
KETONES UR: NEGATIVE mg/dL
Nitrite: POSITIVE — AB
PH: 6 (ref 5.0–8.0)
Protein, ur: 100 mg/dL — AB
SQUAMOUS EPITHELIAL / LPF: NONE SEEN
Specific Gravity, Urine: 1.011 (ref 1.005–1.030)

## 2016-08-23 LAB — COMPREHENSIVE METABOLIC PANEL
ALBUMIN: 4 g/dL (ref 3.5–5.0)
ALK PHOS: 36 U/L — AB (ref 38–126)
ALT: 18 U/L (ref 14–54)
AST: 32 U/L (ref 15–41)
Anion gap: 10 (ref 5–15)
BILIRUBIN TOTAL: 0.4 mg/dL (ref 0.3–1.2)
BUN: 24 mg/dL — AB (ref 6–20)
CALCIUM: 9.5 mg/dL (ref 8.9–10.3)
CO2: 21 mmol/L — AB (ref 22–32)
CREATININE: 1.63 mg/dL — AB (ref 0.44–1.00)
Chloride: 104 mmol/L (ref 101–111)
GFR calc Af Amer: 33 mL/min — ABNORMAL LOW (ref >60)
GFR calc non Af Amer: 28 mL/min — ABNORMAL LOW (ref >60)
GLUCOSE: 200 mg/dL — AB (ref 65–99)
Potassium: 4 mmol/L (ref 3.5–5.1)
Sodium: 135 mmol/L (ref 135–145)
TOTAL PROTEIN: 7.4 g/dL (ref 6.5–8.1)

## 2016-08-23 LAB — GLUCOSE, CAPILLARY
GLUCOSE-CAPILLARY: 143 mg/dL — AB (ref 65–99)
GLUCOSE-CAPILLARY: 143 mg/dL — AB (ref 65–99)
Glucose-Capillary: 123 mg/dL — ABNORMAL HIGH (ref 65–99)

## 2016-08-23 LAB — CBC
HCT: 34 % — ABNORMAL LOW (ref 36.0–46.0)
Hemoglobin: 11.4 g/dL — ABNORMAL LOW (ref 12.0–15.0)
MCH: 30.2 pg (ref 26.0–34.0)
MCHC: 33.5 g/dL (ref 30.0–36.0)
MCV: 89.9 fL (ref 78.0–100.0)
Platelets: 154 10*3/uL (ref 150–400)
RBC: 3.78 MIL/uL — ABNORMAL LOW (ref 3.87–5.11)
RDW: 12.8 % (ref 11.5–15.5)
WBC: 4.7 10*3/uL (ref 4.0–10.5)

## 2016-08-23 LAB — I-STAT CG4 LACTIC ACID, ED: LACTIC ACID, VENOUS: 1.07 mmol/L (ref 0.5–1.9)

## 2016-08-23 LAB — CBG MONITORING, ED: GLUCOSE-CAPILLARY: 170 mg/dL — AB (ref 65–99)

## 2016-08-23 MED ORDER — ROSUVASTATIN CALCIUM 10 MG PO TABS
10.0000 mg | ORAL_TABLET | Freq: Every day | ORAL | Status: DC
  Administered 2016-08-23 – 2016-08-25 (×3): 10 mg via ORAL
  Filled 2016-08-23 (×4): qty 1

## 2016-08-23 MED ORDER — AMLODIPINE BESYLATE 5 MG PO TABS
5.0000 mg | ORAL_TABLET | Freq: Every day | ORAL | Status: DC
Start: 2016-08-23 — End: 2016-08-26
  Administered 2016-08-23 – 2016-08-26 (×4): 5 mg via ORAL
  Filled 2016-08-23 (×4): qty 1

## 2016-08-23 MED ORDER — PNEUMOCOCCAL VAC POLYVALENT 25 MCG/0.5ML IJ INJ
0.5000 mL | INJECTION | INTRAMUSCULAR | Status: AC
  Administered 2016-08-24: 0.5 mL via INTRAMUSCULAR
  Filled 2016-08-23: qty 0.5

## 2016-08-23 MED ORDER — CEFTRIAXONE SODIUM 1 G IJ SOLR
1.0000 g | Freq: Once | INTRAMUSCULAR | Status: AC
  Administered 2016-08-23: 1 g via INTRAVENOUS
  Filled 2016-08-23: qty 10

## 2016-08-23 MED ORDER — DEXTROSE 5 % IV SOLN
1.0000 g | INTRAVENOUS | Status: DC
  Administered 2016-08-24: 1 g via INTRAVENOUS
  Filled 2016-08-23 (×2): qty 10

## 2016-08-23 MED ORDER — INSULIN ASPART 100 UNIT/ML ~~LOC~~ SOLN
0.0000 [IU] | Freq: Three times a day (TID) | SUBCUTANEOUS | Status: DC
  Administered 2016-08-23: 1 [IU] via SUBCUTANEOUS
  Administered 2016-08-24: 3 [IU] via SUBCUTANEOUS
  Administered 2016-08-24 (×2): 1 [IU] via SUBCUTANEOUS
  Administered 2016-08-25 (×2): 2 [IU] via SUBCUTANEOUS
  Administered 2016-08-25: 1 [IU] via SUBCUTANEOUS

## 2016-08-23 MED ORDER — ACETAMINOPHEN 325 MG PO TABS
650.0000 mg | ORAL_TABLET | Freq: Once | ORAL | Status: AC
Start: 2016-08-23 — End: 2016-08-23
  Administered 2016-08-23: 650 mg via ORAL
  Filled 2016-08-23: qty 2

## 2016-08-23 MED ORDER — SODIUM CHLORIDE 0.9 % IV SOLN
INTRAVENOUS | Status: DC
  Administered 2016-08-23 – 2016-08-26 (×5): via INTRAVENOUS

## 2016-08-23 MED ORDER — LATANOPROST 0.005 % OP SOLN
1.0000 [drp] | Freq: Every day | OPHTHALMIC | Status: DC
  Administered 2016-08-23 – 2016-08-25 (×3): 1 [drp] via OPHTHALMIC
  Filled 2016-08-23: qty 2.5

## 2016-08-23 MED ORDER — SODIUM CHLORIDE 0.9 % IV BOLUS (SEPSIS)
500.0000 mL | Freq: Once | INTRAVENOUS | Status: AC
  Administered 2016-08-23: 500 mL via INTRAVENOUS

## 2016-08-23 MED ORDER — INSULIN GLARGINE 100 UNIT/ML ~~LOC~~ SOLN
25.0000 [IU] | Freq: Every day | SUBCUTANEOUS | Status: DC
  Administered 2016-08-23 – 2016-08-25 (×3): 25 [IU] via SUBCUTANEOUS
  Filled 2016-08-23 (×3): qty 0.25

## 2016-08-23 MED ORDER — LEVOTHYROXINE SODIUM 25 MCG PO TABS
175.0000 ug | ORAL_TABLET | Freq: Every day | ORAL | Status: DC
  Administered 2016-08-24 – 2016-08-26 (×3): 175 ug via ORAL
  Filled 2016-08-23 (×3): qty 1

## 2016-08-23 MED ORDER — SODIUM CHLORIDE 0.9 % IV BOLUS (SEPSIS)
1000.0000 mL | Freq: Once | INTRAVENOUS | Status: AC
  Administered 2016-08-23: 1000 mL via INTRAVENOUS

## 2016-08-23 MED ORDER — ENOXAPARIN SODIUM 30 MG/0.3ML ~~LOC~~ SOLN
30.0000 mg | SUBCUTANEOUS | Status: DC
  Administered 2016-08-23 – 2016-08-26 (×4): 30 mg via SUBCUTANEOUS
  Filled 2016-08-23 (×4): qty 0.3

## 2016-08-23 MED ORDER — ASPIRIN 81 MG PO CHEW
81.0000 mg | CHEWABLE_TABLET | Freq: Every day | ORAL | Status: DC
  Administered 2016-08-23 – 2016-08-26 (×4): 81 mg via ORAL
  Filled 2016-08-23 (×4): qty 1

## 2016-08-23 MED ORDER — ACETAMINOPHEN 325 MG PO TABS
650.0000 mg | ORAL_TABLET | Freq: Four times a day (QID) | ORAL | Status: DC | PRN
  Administered 2016-08-23 – 2016-08-25 (×7): 650 mg via ORAL
  Filled 2016-08-23 (×7): qty 2

## 2016-08-23 MED ORDER — ROSUVASTATIN CALCIUM 10 MG PO TABS: 20.0000 mg | ORAL_TABLET | Freq: Every day | ORAL | Status: DC

## 2016-08-23 NOTE — Progress Notes (Signed)
Pt refused MRI. MD aware. Kizzie Ide, RN

## 2016-08-23 NOTE — Evaluation (Signed)
Physical Therapy Evaluation Patient Details Name: Jocelyn Mendez MRN: [id number] DOB: 1930-06-13 Today's Date: 08/23/2016   History of Present Illness   84 y.o. female with medical history significant of hypertension, hyperlipidemia, OA, lumbago, neuropathy, DM who presents with 1 day history of confusion and found to have sepsis secondary to UTI.  Clinical Impression  Pt admitted with above diagnosis. Pt currently with functional limitations due to the deficits listed below (see PT Problem List). Pt will benefit from skilled PT to increase their independence and safety with mobility to allow discharge to the venue listed below.  Pt with unsteadiness with gait without AD and demonstrated improved safety with RW.  Pt has 24 hour A at home.  Recommend RW.     Follow Up Recommendations No PT follow up    Equipment Recommendations  Rolling walker with 5" wheels    Recommendations for Other Services       Precautions / Restrictions Precautions Precautions: Fall Restrictions Weight Bearing Restrictions: No      Mobility  Bed Mobility Overal bed mobility: Modified Independent                Transfers Overall transfer level: Needs assistance Equipment used: None Transfers: Sit to/from Stand Sit to Stand: Min guard         General transfer comment: min/guard for safety  Ambulation/Gait Ambulation/Gait assistance: Min assist;Min guard Ambulation Distance (Feet): 44 Feet (x 2) Assistive device: Rolling walker (2 wheeled) (IV pole) Gait Pattern/deviations: Decreased step length - right;Decreased step length - left;Drifts right/left;Trunk flexed;Narrow base of support     General Gait Details: Pt ambulated 28' with B Ue on IV pole with drifting and unsteadiness.  Did another 35' with RW with improved step length and improved safety.  Stairs            Wheelchair Mobility    Modified Rankin (Stroke Patients Only)       Balance Overall balance assessment:  Needs assistance Sitting-balance support: Feet supported Sitting balance-Leahy Scale: Good Sitting balance - Comments: Pt able to don socks while sitting EOB     Standing balance-Leahy Scale: Poor Standing balance comment: required UE support for dynamic movement                             Pertinent Vitals/Pain Pain Assessment: No/denies pain    Home Living Family/patient expects to be discharged to:: Private residence Living Arrangements: Children Available Help at Discharge: Family;Available 24 hours/day Type of Home: Mobile home Home Access: Stairs to enter Entrance Stairs-Rails: Right;Left;Can reach both Entrance Stairs-Number of Steps: 2 Home Layout: One level Home Equipment: None      Prior Function Level of Independence: Independent               Hand Dominance        Extremity/Trunk Assessment   Upper Extremity Assessment Upper Extremity Assessment: Defer to OT evaluation    Lower Extremity Assessment Lower Extremity Assessment: Overall WFL for tasks assessed       Communication   Communication: No difficulties  Cognition Arousal/Alertness: Awake/alert (but sleepy) Behavior During Therapy: WFL for tasks assessed/performed Overall Cognitive Status: Within Functional Limits for tasks assessed                                 General Comments: Pt sleeping very soundly upon arrival.  She was able  to awaken and participate, but sleepy and went right back to bed after session.      General Comments      Exercises     Assessment/Plan    PT Assessment Patient needs continued PT services  PT Problem List Decreased strength;Decreased activity tolerance;Decreased balance;Decreased mobility       PT Treatment Interventions DME instruction;Gait training;Stair training;Functional mobility training;Therapeutic activities;Therapeutic exercise;Patient/family education    PT Goals (Current goals can be found in the Care Plan  section)  Acute Rehab PT Goals Patient Stated Goal: go home PT Goal Formulation: With patient/family Time For Goal Achievement: 08/30/16 Potential to Achieve Goals: Good    Frequency Min 3X/week   Barriers to discharge        Co-evaluation               AM-PAC PT "6 Clicks" Daily Activity  Outcome Measure Difficulty turning over in bed (including adjusting bedclothes, sheets and blankets)?: None Difficulty moving from lying on back to sitting on the side of the bed? : None Difficulty sitting down on and standing up from a chair with arms (e.g., wheelchair, bedside commode, etc,.)?: A Little Help needed moving to and from a bed to chair (including a wheelchair)?: A Little Help needed walking in hospital room?: A Little Help needed climbing 3-5 steps with a railing? : A Little 6 Click Score: 20    End of Session Equipment Utilized During Treatment: Gait belt Activity Tolerance: Patient tolerated treatment well Patient left: in bed;with call bell/phone within reach;with bed alarm set;with family/visitor present Nurse Communication: Mobility status PT Visit Diagnosis: Unsteadiness on feet (R26.81);Difficulty in walking, not elsewhere classified (R26.2)    Time: 4128-2081 PT Time Calculation (min) (ACUTE ONLY): 21 min   Charges:   PT Evaluation $PT Eval Low Complexity: 1 Procedure     PT G Codes:   PT G-Codes **NOT FOR INPATIENT CLASS** Functional Assessment Tool Used: AM-PAC 6 Clicks Basic Mobility;Clinical judgement Functional Limitation: Mobility: Walking and moving around Mobility: Walking and Moving Around Current Status ([id number]): At least 20 percent but less than 40 percent impaired, limited or restricted Mobility: Walking and Moving Around Goal Status [phone]): At least 1 percent but less than 20 percent impaired, limited or restricted    Santiago Glad L. Tamala Julian, [place] Pager [phone] 08/23/2016   Galen Manila 08/23/2016, 2:47 PM

## 2016-08-23 NOTE — H&P (Addendum)
History and Physical    Jocelyn Mendez QVZ:[id number] DOB: 04/14/1930 DOA: 08/23/2016  PCP: Hoyt Koch, MD  Patient coming from: Home  Chief Complaint: acute encephalopathy   HPI: Jocelyn Mendez is a 83 y.o. female with medical history significant of hypertension, hyperlipidemia who presents with 1 day history of confusion. She lives at home with her son. Daughter at bedside gives history and states that over the past 2 nights, patient has been very confused, not making any sense, having difficulty with speech getting words out. This has been fluctuating in nature. Currently, her mentation and speech difficulties have resolved in the emergency department. She has also had complaints of subjective fevers and chills at home. Denies any chest pain or shortness of breath, no cough or nausea, vomiting, diarrhea. She has chronic urinary incontinence, but denies any dysuria.   ED Course: Workup revealed urinary tract infection, started on Rocephin.  Review of Systems: As per HPI otherwise 10 point review of systems negative.   Past Medical History:  Diagnosis Date  . Acute bronchitis   . Anemia of other chronic disease   . Anxiety state, unspecified   . Chest pain, unspecified   . Diverticulosis of colon (without mention of hemorrhage)   . Esophageal reflux   . Gallstones   . Generalized osteoarthrosis, unspecified site   . Headache(784.0)   . Lumbago   . Neuropathy in diabetes ([place])    bilat LE's  . Osteoporosis   . Other and unspecified hyperlipidemia   . Other B-complex deficiencies   . Type II or unspecified type diabetes mellitus without mention of complication, not stated as uncontrolled   . Unspecified disorder resulting from impaired renal function   . Unspecified essential hypertension   . Unspecified hypothyroidism     Past Surgical History:  Procedure Laterality Date  . APPENDECTOMY    . THYROIDECTOMY       reports that she quit smoking about 43 years  ago. Her smoking use included Cigarettes. She has a 3.00 pack-year smoking history. She quit smokeless tobacco use about 6 years ago. Her smokeless tobacco use included Snuff. She reports that she does not drink alcohol or use drugs.  Allergies  Allergen Reactions  . Codeine     REACTION: change in mental status  . Diphenhydramine Hcl     REACTION: itching  . Quinine     REACTION: abnormal sensations in face  . Sulfonamide Derivatives     REACTION: rash    Family History  Problem Relation Age of Onset  . Diabetes Mother   . Cancer Father        unsure ? stomach  . Heart attack Brother        x 3  . Cancer Sister        unsure type  . Lung cancer Brother        smoker  . Heart attack Sister     Prior to Admission medications   Medication Sig Start Date End Date Taking? Authorizing Provider  albuterol (PROVENTIL HFA;VENTOLIN HFA) 108 (90 BASE) MCG/ACT inhaler Inhale 2 puffs into the lungs every 6 (six) hours as needed for wheezing or shortness of breath. 04/06/14  Yes Hoyt Koch, MD  amLODipine (NORVASC) 5 MG tablet TAKE 1 TABLET BY MOUTH DAILY 06/09/16  Yes Hoyt Koch, MD  aspirin 81 MG tablet Take 81 mg by mouth daily.     Yes [provider]  Cholecalciferol (VITAMIN D3) 2000 UNITS TABS  Take 1 tablet by mouth 2 (two) times daily.   Yes [provider]  cyanocobalamin (,VITAMIN B-12,) 1000 MCG/ML injection INJECT 1ML INTO THE MUSCLE ONCE A MONTH 02/08/16  Yes Hoyt Koch, MD  fenofibrate 160 MG tablet TAKE 1 TABLET BY MOUTH DAILY 05/06/16  Yes Hoyt Koch, MD  ferrous sulfate 325 (65 FE) MG EC tablet Take 325 mg by mouth 2 (two) times daily.    Yes [provider]  GLOBAL EASE INJECT PEN NEEDLES 32G X 4 MM MISC USE AS DIRECTED WITH THE HUMULIN PEN INSULIN (100 DAY SUPPLY) 08/04/16  Yes Renato Shin, MD  Insulin NPH, Human,, Isophane, (HUMULIN N) 100 UNIT/ML Kiwkpen Inject 110 Units into the skin every morning.  08/04/16  Yes Renato Shin, MD  Insulin Pen Needle 31G X 8 MM MISC 1 each by Does not apply route. Use with insulin pen device, humulin   Yes [provider]  Lancets (ONETOUCH ULTRASOFT) lancets Use as instructed 02/15/13  Yes Noralee Space, MD  latanoprost (XALATAN) 0.005 % ophthalmic solution Place 1 drop into both eyes at bedtime. 08/12/16  Yes [provider]  levothyroxine (SYNTHROID, LEVOTHROID) 175 MCG tablet TAKE 1 TABLET BY MOUTH EVERY DAY BEFORE BREAKFAST 06/29/16  Yes Renato Shin, MD  NEEDLE, DISP, 25 G (BD DISP NEEDLES) 25G X 5/8" MISC Use as directed to administer  the b12 injection monthly 10/05/14  Yes Hoyt Koch, MD  ONE TOUCH ULTRA TEST test strip USE TO TEST BLOOD SUGAR 4 TIMES DAILY DX: E10.22 07/21/16  Yes Renato Shin, MD  rosuvastatin (CRESTOR) 20 MG tablet Take 1 tablet (20 mg total) by mouth daily. 04/02/16  Yes Hoyt Koch, MD  docusate sodium (COLACE) 100 MG capsule Take 1 capsule (100 mg total) by mouth 2 (two) times daily. Patient not taking: Reported on 08/23/2016 04/15/16   Hoyt Koch, MD  furosemide (LASIX) 20 MG tablet TAKE 1 TABLET BY MOUTH DAILY Patient not taking: Reported on 08/23/2016 08/03/14   Hoyt Koch, MD    Physical Exam: Vitals:   08/23/16 0715 08/23/16 0730 08/23/16 0745 08/23/16 0800  BP: (!) 150/53 (!) 150/47 (!) 141/47 (!) 132/97  Pulse: 97 96 96 (!) 101  Resp: (!) 28 (!) 23 (!) 27 (!) 32  Temp:      TempSrc:      SpO2: 90% 90% (!) 89% 93%  Weight:      Height:         Constitutional: NAD, calm, comfortable Eyes: PERRL, lids and conjunctivae normal ENMT: Mucous membranes are dry. No upper teeth, poor dentition of bottom teeth  Neck: normal, supple, no masses, no thyromegaly Respiratory: clear to auscultation bilaterally, no wheezing, no crackles. Normal respiratory effort. No accessory muscle use.  Cardiovascular: Regular rate and rhythm, no murmurs / rubs / gallops. No extremity  edema.  Abdomen: no tenderness, no masses palpated. No hepatosplenomegaly. Bowel sounds positive. Abdomen soft.  Musculoskeletal: no clubbing / cyanosis. No joint deformity upper and lower extremities. Good ROM, no contractures. Normal muscle tone.  Skin: no rashes, lesions, ulcers. No induration on exposed skin  Neurologic: CN 2-12 grossly intact. Strength 5/5 in all 4. Finger-nose symmetric.  Psychiatric: Normal judgment and insight. Alert and oriented x 3. Normal mood.   Labs on Admission: I have personally reviewed following labs and imaging studies  CBC:  Recent Labs Lab 08/23/16 0543  WBC 4.7  HGB 11.4*  HCT 34.0*  MCV 89.9  PLT 154  Basic Metabolic Panel:  Recent Labs Lab 08/23/16 0543  NA 135  K 4.0  CL 104  CO2 21*  GLUCOSE 200*  BUN 24*  CREATININE 1.63*  CALCIUM 9.5   GFR: Estimated Creatinine Clearance: 27.4 mL/min (A) (by C-G formula based on SCr of 1.63 mg/dL (H)). Liver Function Tests:  Recent Labs Lab 08/23/16 0543  AST 32  ALT 18  ALKPHOS 36*  BILITOT 0.4  PROT 7.4  ALBUMIN 4.0   No results for input(s): LIPASE, AMYLASE in the last 168 hours. No results for input(s): AMMONIA in the last 168 hours. Coagulation Profile: No results for input(s): INR, PROTIME in the last 168 hours. Cardiac Enzymes: No results for input(s): CKTOTAL, CKMB, CKMBINDEX, TROPONINI in the last 168 hours. BNP (last 3 results) No results for input(s): PROBNP in the last 8760 hours. HbA1C: No results for input(s): HGBA1C in the last 72 hours. CBG:  Recent Labs Lab 08/23/16 0610  GLUCAP 170*   Lipid Profile: No results for input(s): CHOL, HDL, LDLCALC, TRIG, CHOLHDL, LDLDIRECT in the last 72 hours. Thyroid Function Tests: No results for input(s): TSH, T4TOTAL, FREET4, T3FREE, THYROIDAB in the last 72 hours. Anemia Panel: No results for input(s): VITAMINB12, FOLATE, FERRITIN, TIBC, IRON, RETICCTPCT in the last 72 hours. Urine analysis:    Component Value  Date/Time   COLORURINE YELLOW 08/23/2016 0620   APPEARANCEUR CLEAR 08/23/2016 0620   LABSPEC 1.011 08/23/2016 0620   PHURINE 6.0 08/23/2016 0620   GLUCOSEU 50 (A) 08/23/2016 0620   HGBUR SMALL (A) 08/23/2016 0620   BILIRUBINUR NEGATIVE 08/23/2016 0620   KETONESUR NEGATIVE 08/23/2016 0620   PROTEINUR 100 (A) 08/23/2016 0620   UROBILINOGEN 0.2 03/02/2013 1111   NITRITE POSITIVE (A) 08/23/2016 0620   LEUKOCYTESUR TRACE (A) 08/23/2016 0620   Sepsis Labs: !!!!!!!!!!!!!!!!!!!!!!!!!!!!!!!!!!!!!!!!!!!! @LABRCNTIP (procalcitonin:4,lacticidven:4) )No results found for this or any previous visit (from the past 240 hour(s)).   Radiological Exams on Admission: Dg Chest Port 1 View  Result Date: 08/23/2016 CLINICAL DATA:  83 year old female with cough and chills. EXAM: PORTABLE CHEST 1 VIEW COMPARISON:  Chest radiograph dated 07/04/2015 FINDINGS: Stable mild cardiomegaly. No focal consolidation, pleural effusion, or pneumothorax. No acute osseous pathology. IMPRESSION: No acute cardiopulmonary process. Stable mild cardiomegaly. Electronically Signed   By: Anner Crete M.D.   On: 08/23/2016 06:53    Assessment/Plan Active Problems:   Acute encephalopathy   Acute encephalopathy -Confused at home, not acting normal, couldn't get words out. Current neuro exam unremarkable and speech fluent and oriented to self, place, time.  -Likely infectious etiology but will rule out stroke with MRI brain.  -Neurochecks q4h   Sepsis secondary to UTI -Fever 102.2, tachycardic, tachypneic on presentation  -Urine culture and blood culture pending -Rocephin -IVF   CKD stage 3 -Baseline Cr 1.5-1.6 -Stable   Hypertension -Continue Norvasc  Diabetes type 2  -Ha1c 7.8 -Lantus, sliding scale insulin  Hypothyroidism -Continue Synthroid  Hyperlipidemia -Continue Crestor   DVT prophylaxis: lovenox  Code Status: DNI. Okay with CPR/defib.   Family Communication: at bedside Disposition Plan: pending  improvement Consults called: none   Severity of Illness: The appropriate patient status for this patient is OBSERVATION. Observation status is judged to be reasonable and necessary in order to provide the required intensity of service to ensure the patient's safety. The patient's presenting symptoms, physical exam findings, and initial radiographic and laboratory data in the context of their medical condition is felt to place them at decreased risk for further clinical deterioration. Furthermore, it is  anticipated that the patient will be medically stable for discharge from the hospital within 2 midnights of admission. The following factors support the patient status of observation.   " The patient's presenting symptoms include acute encephalopathy. " The physical exam findings include dehydration. " The initial radiographic and laboratory data are consistent with UTI.   Dessa Phi, DO [facility] [email or web address] Password [email or web address] 08/23/2016, 9:02 AM

## 2016-08-23 NOTE — ED Provider Notes (Signed)
[facility] DEPT Provider Note   CSN: [id number] Arrival date & time: 08/23/16  0504  Time seen 05:20 AM   History   Chief Complaint Chief Complaint  Patient presents with  . Altered Mental Status   Level V caveat for age  HPI Jocelyn Mendez is a 83 y.o. female.  HPI  patient presents emergency part of her daughter who lives next door. Patient lives with her son. Daughter states on July 12 the patient woke up and was having difficulty saying her words and she was hard to understand. She seemed confused and was saying inappropriate things. Yesterday she didn't eat as much as usual. Daughter states she normally walks with some mild staggering however now she is unable to stand up or walk at all. She did have one day episode of diarrhea last night and nausea with some dry heaves. She states she has a chronic cough which is not worse. When I asked her was going on she states "I don't know". However when asked to give details she states she's having difficulty holding her urine.Her daughter also states she's making inappropriate statements such as saying "he doesn't know why he is here". She states there was nobody else in the room but the 2 of them.  PCP Hoyt Koch, MD   Past Medical History:  Diagnosis Date  . Acute bronchitis   . Anemia of other chronic disease   . Anxiety state, unspecified   . Chest pain, unspecified   . Diverticulosis of colon (without mention of hemorrhage)   . Esophageal reflux   . Gallstones   . Generalized osteoarthrosis, unspecified site   . Headache(784.0)   . Lumbago   . Neuropathy in diabetes ([place])    bilat LE's  . Osteoporosis   . Other and unspecified hyperlipidemia   . Other B-complex deficiencies   . Type II or unspecified type diabetes mellitus without mention of complication, not stated as uncontrolled   . Unspecified disorder resulting from impaired renal function   . Unspecified essential hypertension   . Unspecified  hypothyroidism     Patient Active Problem List   Diagnosis Date Noted  . Acute encephalopathy 08/23/2016  . RLQ abdominal pain 04/17/2015  . Diabetes ([place]) 03/28/2015  . AB (asthmatic bronchitis) 05/18/2013  . Gait abnormality 05/13/2010  . B12 deficiency 11/23/2007  . ANEMIA OF CHRONIC DISEASE 01/21/2007  . CKD (chronic kidney disease) stage 3, GFR 30-59 ml/min 01/21/2007  . Hypothyroidism 11/21/2006  . Hyperlipidemia 11/21/2006  . Essential hypertension 11/21/2006  . GERD 11/21/2006  . DEGENERATIVE JOINT DISEASE, GENERALIZED 11/21/2006  . Osteoporosis 11/21/2006    Past Surgical History:  Procedure Laterality Date  . APPENDECTOMY    . THYROIDECTOMY      OB History    No data available       Home Medications    Prior to Admission medications   Medication Sig Start Date End Date Taking? Authorizing Provider  albuterol (PROVENTIL HFA;VENTOLIN HFA) 108 (90 BASE) MCG/ACT inhaler Inhale 2 puffs into the lungs every 6 (six) hours as needed for wheezing or shortness of breath. 04/06/14  Yes Hoyt Koch, MD  amLODipine (NORVASC) 5 MG tablet TAKE 1 TABLET BY MOUTH DAILY 06/09/16  Yes Hoyt Koch, MD  aspirin 81 MG tablet Take 81 mg by mouth daily.     Yes [provider]  Cholecalciferol (VITAMIN D3) 2000 UNITS TABS Take 1 tablet by mouth 2 (two) times daily.   Yes [provider]  cyanocobalamin (,VITAMIN B-12,) 1000 MCG/ML injection INJECT 1ML INTO THE MUSCLE ONCE A MONTH 02/08/16  Yes Hoyt Koch, MD  fenofibrate 160 MG tablet TAKE 1 TABLET BY MOUTH DAILY 05/06/16  Yes Hoyt Koch, MD  ferrous sulfate 325 (65 FE) MG EC tablet Take 325 mg by mouth 2 (two) times daily.    Yes [provider]  GLOBAL EASE INJECT PEN NEEDLES 32G X 4 MM MISC USE AS DIRECTED WITH THE HUMULIN PEN INSULIN (100 DAY SUPPLY) 08/04/16  Yes Renato Shin, MD  Insulin NPH, Human,, Isophane, (HUMULIN N) 100 UNIT/ML Kiwkpen Inject 110 Units into  the skin every morning. 08/04/16  Yes Renato Shin, MD  Insulin Pen Needle 31G X 8 MM MISC 1 each by Does not apply route. Use with insulin pen device, humulin   Yes [provider]  Lancets (ONETOUCH ULTRASOFT) lancets Use as instructed 02/15/13  Yes Noralee Space, MD  latanoprost (XALATAN) 0.005 % ophthalmic solution Place 1 drop into both eyes at bedtime. 08/12/16  Yes [provider]  levothyroxine (SYNTHROID, LEVOTHROID) 175 MCG tablet TAKE 1 TABLET BY MOUTH EVERY DAY BEFORE BREAKFAST 06/29/16  Yes Renato Shin, MD  NEEDLE, DISP, 25 G (BD DISP NEEDLES) 25G X 5/8" MISC Use as directed to administer  the b12 injection monthly 10/05/14  Yes Hoyt Koch, MD  ONE TOUCH ULTRA TEST test strip USE TO TEST BLOOD SUGAR 4 TIMES DAILY DX: E10.22 07/21/16  Yes Renato Shin, MD  rosuvastatin (CRESTOR) 20 MG tablet Take 1 tablet (20 mg total) by mouth daily. 04/02/16  Yes Hoyt Koch, MD  docusate sodium (COLACE) 100 MG capsule Take 1 capsule (100 mg total) by mouth 2 (two) times daily. Patient not taking: Reported on 08/23/2016 04/15/16   Hoyt Koch, MD  furosemide (LASIX) 20 MG tablet TAKE 1 TABLET BY MOUTH DAILY Patient not taking: Reported on 08/23/2016 08/03/14   Hoyt Koch, MD    Family History Family History  Problem Relation Age of Onset  . Diabetes Mother   . Cancer Father        unsure ? stomach  . Heart attack Brother        x 3  . Cancer Sister        unsure type  . Lung cancer Brother        smoker  . Heart attack Sister     Social History Social History  Substance Use Topics  . Smoking status: Former Smoker    Packs/day: 0.50    Years: 6.00    Types: Cigarettes    Quit date: 02/10/1973  . Smokeless tobacco: Former Systems developer    Types: Snuff    Quit date: 04/07/2010  . Alcohol use No  lives with son   Allergies   Codeine; Diphenhydramine hcl; Quinine; and Sulfonamide derivatives   Review of Systems Review of Systems  All  other systems reviewed and are negative.    Physical Exam Updated Vital Signs BP (!) 150/47   Pulse 96   Temp (!) 102.2 F (39 C) (Rectal)   Resp (!) 23   Ht 5\' 6"  (1.676 m)   Wt 77.1 kg (170 lb)   SpO2 90%   BMI 27.44 kg/m   Vital signs normal except for fever   Physical Exam  Constitutional: She is oriented to person, place, and time. She appears well-developed and well-nourished.  Non-toxic appearance. She does not appear ill. No distress.  Obese, having chills  during my exam  HENT:  Head: Normocephalic and atraumatic.  Right Ear: External ear normal.  Left Ear: External ear normal.  Nose: Nose normal. No mucosal edema or rhinorrhea.  Mouth/Throat: Oropharynx is clear and moist. Mucous membranes are dry. No dental abscesses or uvula swelling.  Eyes: Pupils are equal, round, and reactive to light. Conjunctivae and EOM are normal.  Neck: Normal range of motion and full passive range of motion without pain. Neck supple.  Cardiovascular: Normal rate, regular rhythm and normal heart sounds.  Exam reveals no gallop and no friction rub.   No murmur heard. Pulmonary/Chest: Effort normal and breath sounds normal. No respiratory distress. She has no wheezes. She has no rhonchi. She has no rales. She exhibits no tenderness and no crepitus.  Abdominal: Soft. Normal appearance and bowel sounds are normal. She exhibits no distension. There is no tenderness. There is no rebound and no guarding.  Musculoskeletal: Normal range of motion. She exhibits no edema or tenderness.  Moves all extremities well.   Neurological: She is alert and oriented to person, place, and time. She has normal strength. No cranial nerve deficit.  Patient has equal grips, she has no lower extremity weakness. Patient is fully alert and oriented to place time including month and year.  Skin: Skin is warm, dry and intact. No rash noted. No erythema. No pallor.  Psychiatric: She has a normal mood and affect. Her speech  is normal and behavior is normal. Her mood appears not anxious.  Nursing note and vitals reviewed.    ED Treatments / Results  Labs (all labs ordered are listed, but only abnormal results are displayed) Results for orders placed or performed during the hospital encounter of 08/23/16  Comprehensive metabolic panel  Result Value Ref Range   Sodium 135 135 - 145 mmol/L   Potassium 4.0 3.5 - 5.1 mmol/L   Chloride 104 101 - 111 mmol/L   CO2 21 (L) 22 - 32 mmol/L   Glucose, Bld 200 (H) 65 - 99 mg/dL   BUN 24 (H) 6 - 20 mg/dL   Creatinine, Ser 1.63 (H) 0.44 - 1.00 mg/dL   Calcium 9.5 8.9 - 10.3 mg/dL   Total Protein 7.4 6.5 - 8.1 g/dL   Albumin 4.0 3.5 - 5.0 g/dL   AST 32 15 - 41 U/L   ALT 18 14 - 54 U/L   Alkaline Phosphatase 36 (L) 38 - 126 U/L   Total Bilirubin 0.4 0.3 - 1.2 mg/dL   GFR calc non Af Amer 28 (L) >60 mL/min   GFR calc Af Amer 33 (L) >60 mL/min   Anion gap 10 5 - 15  CBC  Result Value Ref Range   WBC 4.7 4.0 - 10.5 K/uL   RBC 3.78 (L) 3.87 - 5.11 MIL/uL   Hemoglobin 11.4 (L) 12.0 - 15.0 g/dL   HCT 34.0 (L) 36.0 - 46.0 %   MCV 89.9 78.0 - 100.0 fL   MCH 30.2 26.0 - 34.0 pg   MCHC 33.5 30.0 - 36.0 g/dL   RDW 12.8 11.5 - 15.5 %   Platelets 154 150 - 400 K/uL  Urinalysis, Routine w reflex microscopic  Result Value Ref Range   Color, Urine YELLOW YELLOW   APPearance CLEAR CLEAR   Specific Gravity, Urine 1.011 1.005 - 1.030   pH 6.0 5.0 - 8.0   Glucose, UA 50 (A) NEGATIVE mg/dL   Hgb urine dipstick SMALL (A) NEGATIVE   Bilirubin Urine NEGATIVE NEGATIVE   Ketones,  ur NEGATIVE NEGATIVE mg/dL   Protein, ur 100 (A) NEGATIVE mg/dL   Nitrite POSITIVE (A) NEGATIVE   Leukocytes, UA TRACE (A) NEGATIVE   RBC / HPF 0-5 0 - 5 RBC/hpf   WBC, UA 6-30 0 - 5 WBC/hpf   Bacteria, UA MANY (A) NONE SEEN   Squamous Epithelial / LPF NONE SEEN NONE SEEN   Mucous PRESENT   CBG monitoring, ED  Result Value Ref Range   Glucose-Capillary 170 (H) 65 - 99 mg/dL  I-Stat CG4 Lactic  Acid, ED  Result Value Ref Range   Lactic Acid, Venous 1.07 0.5 - 1.9 mmol/L   Laboratory interpretation all normal except possible UTI, mild anemia, Stable chronic insufficiency    EKG  EKG Interpretation None       Radiology Dg Chest Port 1 View  Result Date: 08/23/2016 CLINICAL DATA:  83 year old female with cough and chills. EXAM: PORTABLE CHEST 1 VIEW COMPARISON:  Chest radiograph dated 07/04/2015 FINDINGS: Stable mild cardiomegaly. No focal consolidation, pleural effusion, or pneumothorax. No acute osseous pathology. IMPRESSION: No acute cardiopulmonary process. Stable mild cardiomegaly. Electronically Signed   By: Anner Crete M.D.   On: 08/23/2016 06:53    Procedures Procedures (including critical care time)  Medications Ordered in ED Medications  cefTRIAXone (ROCEPHIN) 1 g in dextrose 5 % 50 mL IVPB (1 g Intravenous New Bag/Given 08/23/16 0750)  sodium chloride 0.9 % bolus 1,000 mL (0 mLs Intravenous Stopped 08/23/16 0651)  sodium chloride 0.9 % bolus 500 mL (0 mLs Intravenous Stopped 08/23/16 0652)  acetaminophen (TYLENOL) tablet 650 mg (650 mg Oral Given 08/23/16 0751)     Initial Impression / Assessment and Plan / ED Course  I have reviewed the triage vital signs and the nursing notes.  Pertinent labs & imaging results that were available during my care of the patient were reviewed by me and considered in my medical decision making (see chart for details).  Laboratory testing and urinalysis was ordered. Chest x-ray was done and has family member has having a bad cough the room and patient states she does have a chronic cough. I suspect however she has UTI. She was given fluid bolus. Cath urine was ordered. Rectal temp was ordered.  Patient was noted to have a rectal temp of 102.2. This is not surprising as she was having chills during my exam. She is given Tylenol for her fever. After reviewing her laboratory results she was given IV Rocephin for her  UTI.   07:52 AM Dr Maylene Roes, hospitalist will admit.   Final Clinical Impressions(s) / ED Diagnoses   Final diagnoses:  Weakness  Fever, unspecified fever cause  Acute cystitis without hematuria  Confusion    Plan admission  Rolland Porter, MD, Barbette Or, MD 08/23/16 [phone]

## 2016-08-23 NOTE — ED Triage Notes (Signed)
Per pt's daughter pt has been, "Talking out of her head."  For 2 days now.  Pt is able to answer cognition questions appropriately for this writer.  Per pt's daughter pt has been fine during the day however declines at night time.  Pt is denying pain at this time.

## 2016-08-24 ENCOUNTER — Observation Stay (HOSPITAL_COMMUNITY): Payer: Medicare Other

## 2016-08-24 DIAGNOSIS — N184 Chronic kidney disease, stage 4 (severe): Secondary | ICD-10-CM | POA: Diagnosis not present

## 2016-08-24 DIAGNOSIS — J9601 Acute respiratory failure with hypoxia: Secondary | ICD-10-CM | POA: Diagnosis not present

## 2016-08-24 DIAGNOSIS — E119 Type 2 diabetes mellitus without complications: Secondary | ICD-10-CM

## 2016-08-24 DIAGNOSIS — I129 Hypertensive chronic kidney disease with stage 1 through stage 4 chronic kidney disease, or unspecified chronic kidney disease: Secondary | ICD-10-CM | POA: Diagnosis present

## 2016-08-24 DIAGNOSIS — R197 Diarrhea, unspecified: Secondary | ICD-10-CM | POA: Diagnosis not present

## 2016-08-24 DIAGNOSIS — R509 Fever, unspecified: Secondary | ICD-10-CM | POA: Diagnosis not present

## 2016-08-24 DIAGNOSIS — E890 Postprocedural hypothyroidism: Secondary | ICD-10-CM | POA: Diagnosis not present

## 2016-08-24 DIAGNOSIS — N3000 Acute cystitis without hematuria: Secondary | ICD-10-CM | POA: Diagnosis not present

## 2016-08-24 DIAGNOSIS — E1122 Type 2 diabetes mellitus with diabetic chronic kidney disease: Secondary | ICD-10-CM | POA: Diagnosis not present

## 2016-08-24 DIAGNOSIS — Z7951 Long term (current) use of inhaled steroids: Secondary | ICD-10-CM | POA: Diagnosis not present

## 2016-08-24 DIAGNOSIS — E872 Acidosis: Secondary | ICD-10-CM | POA: Diagnosis not present

## 2016-08-24 DIAGNOSIS — K5750 Diverticulosis of both small and large intestine without perforation or abscess without bleeding: Secondary | ICD-10-CM | POA: Diagnosis present

## 2016-08-24 DIAGNOSIS — R251 Tremor, unspecified: Secondary | ICD-10-CM | POA: Diagnosis present

## 2016-08-24 DIAGNOSIS — J440 Chronic obstructive pulmonary disease with acute lower respiratory infection: Secondary | ICD-10-CM | POA: Diagnosis not present

## 2016-08-24 DIAGNOSIS — R627 Adult failure to thrive: Secondary | ICD-10-CM

## 2016-08-24 DIAGNOSIS — Z8249 Family history of ischemic heart disease and other diseases of the circulatory system: Secondary | ICD-10-CM | POA: Diagnosis not present

## 2016-08-24 DIAGNOSIS — Z801 Family history of malignant neoplasm of trachea, bronchus and lung: Secondary | ICD-10-CM | POA: Diagnosis not present

## 2016-08-24 DIAGNOSIS — G9340 Encephalopathy, unspecified: Secondary | ICD-10-CM

## 2016-08-24 DIAGNOSIS — Z794 Long term (current) use of insulin: Secondary | ICD-10-CM

## 2016-08-24 DIAGNOSIS — R05 Cough: Secondary | ICD-10-CM | POA: Diagnosis not present

## 2016-08-24 DIAGNOSIS — J90 Pleural effusion, not elsewhere classified: Secondary | ICD-10-CM | POA: Diagnosis present

## 2016-08-24 DIAGNOSIS — F411 Generalized anxiety disorder: Secondary | ICD-10-CM | POA: Diagnosis present

## 2016-08-24 DIAGNOSIS — I10 Essential (primary) hypertension: Secondary | ICD-10-CM | POA: Diagnosis not present

## 2016-08-24 DIAGNOSIS — J9811 Atelectasis: Secondary | ICD-10-CM | POA: Diagnosis present

## 2016-08-24 DIAGNOSIS — A4151 Sepsis due to Escherichia coli [E. coli]: Secondary | ICD-10-CM | POA: Diagnosis not present

## 2016-08-24 DIAGNOSIS — Z7982 Long term (current) use of aspirin: Secondary | ICD-10-CM | POA: Diagnosis not present

## 2016-08-24 DIAGNOSIS — E1140 Type 2 diabetes mellitus with diabetic neuropathy, unspecified: Secondary | ICD-10-CM | POA: Diagnosis not present

## 2016-08-24 DIAGNOSIS — K760 Fatty (change of) liver, not elsewhere classified: Secondary | ICD-10-CM | POA: Diagnosis not present

## 2016-08-24 DIAGNOSIS — K219 Gastro-esophageal reflux disease without esophagitis: Secondary | ICD-10-CM | POA: Diagnosis present

## 2016-08-24 DIAGNOSIS — K8020 Calculus of gallbladder without cholecystitis without obstruction: Secondary | ICD-10-CM | POA: Diagnosis not present

## 2016-08-24 DIAGNOSIS — N183 Chronic kidney disease, stage 3 (moderate): Secondary | ICD-10-CM | POA: Diagnosis present

## 2016-08-24 DIAGNOSIS — R269 Unspecified abnormalities of gait and mobility: Secondary | ICD-10-CM | POA: Diagnosis not present

## 2016-08-24 DIAGNOSIS — Z87891 Personal history of nicotine dependence: Secondary | ICD-10-CM | POA: Diagnosis not present

## 2016-08-24 DIAGNOSIS — G9341 Metabolic encephalopathy: Secondary | ICD-10-CM | POA: Diagnosis present

## 2016-08-24 DIAGNOSIS — E785 Hyperlipidemia, unspecified: Secondary | ICD-10-CM | POA: Diagnosis not present

## 2016-08-24 DIAGNOSIS — J45909 Unspecified asthma, uncomplicated: Secondary | ICD-10-CM | POA: Diagnosis present

## 2016-08-24 DIAGNOSIS — J441 Chronic obstructive pulmonary disease with (acute) exacerbation: Secondary | ICD-10-CM | POA: Diagnosis not present

## 2016-08-24 DIAGNOSIS — E039 Hypothyroidism, unspecified: Secondary | ICD-10-CM | POA: Diagnosis present

## 2016-08-24 DIAGNOSIS — E860 Dehydration: Secondary | ICD-10-CM | POA: Diagnosis present

## 2016-08-24 DIAGNOSIS — M810 Age-related osteoporosis without current pathological fracture: Secondary | ICD-10-CM | POA: Diagnosis not present

## 2016-08-24 DIAGNOSIS — Z9049 Acquired absence of other specified parts of digestive tract: Secondary | ICD-10-CM | POA: Diagnosis not present

## 2016-08-24 DIAGNOSIS — I5033 Acute on chronic diastolic (congestive) heart failure: Secondary | ICD-10-CM | POA: Diagnosis not present

## 2016-08-24 DIAGNOSIS — J45901 Unspecified asthma with (acute) exacerbation: Secondary | ICD-10-CM | POA: Diagnosis not present

## 2016-08-24 DIAGNOSIS — R410 Disorientation, unspecified: Secondary | ICD-10-CM | POA: Diagnosis not present

## 2016-08-24 DIAGNOSIS — A419 Sepsis, unspecified organism: Secondary | ICD-10-CM | POA: Diagnosis present

## 2016-08-24 DIAGNOSIS — Z5329 Procedure and treatment not carried out because of patient's decision for other reasons: Secondary | ICD-10-CM | POA: Diagnosis present

## 2016-08-24 DIAGNOSIS — I5031 Acute diastolic (congestive) heart failure: Secondary | ICD-10-CM | POA: Diagnosis not present

## 2016-08-24 DIAGNOSIS — R531 Weakness: Secondary | ICD-10-CM | POA: Diagnosis not present

## 2016-08-24 DIAGNOSIS — Z833 Family history of diabetes mellitus: Secondary | ICD-10-CM | POA: Diagnosis not present

## 2016-08-24 DIAGNOSIS — I130 Hypertensive heart and chronic kidney disease with heart failure and stage 1 through stage 4 chronic kidney disease, or unspecified chronic kidney disease: Secondary | ICD-10-CM | POA: Diagnosis not present

## 2016-08-24 DIAGNOSIS — G928 Other toxic encephalopathy: Secondary | ICD-10-CM | POA: Diagnosis present

## 2016-08-24 DIAGNOSIS — Z23 Encounter for immunization: Secondary | ICD-10-CM | POA: Diagnosis not present

## 2016-08-24 DIAGNOSIS — R0602 Shortness of breath: Secondary | ICD-10-CM | POA: Diagnosis not present

## 2016-08-24 DIAGNOSIS — D638 Anemia in other chronic diseases classified elsewhere: Secondary | ICD-10-CM | POA: Diagnosis not present

## 2016-08-24 DIAGNOSIS — J209 Acute bronchitis, unspecified: Secondary | ICD-10-CM | POA: Diagnosis not present

## 2016-08-24 DIAGNOSIS — Z79899 Other long term (current) drug therapy: Secondary | ICD-10-CM | POA: Diagnosis not present

## 2016-08-24 DIAGNOSIS — J329 Chronic sinusitis, unspecified: Secondary | ICD-10-CM | POA: Diagnosis present

## 2016-08-24 LAB — BASIC METABOLIC PANEL
ANION GAP: 8 (ref 5–15)
BUN: 19 mg/dL (ref 6–20)
CALCIUM: 8.8 mg/dL — AB (ref 8.9–10.3)
CO2: 21 mmol/L — ABNORMAL LOW (ref 22–32)
Chloride: 107 mmol/L (ref 101–111)
Creatinine, Ser: 1.54 mg/dL — ABNORMAL HIGH (ref 0.44–1.00)
GFR, EST AFRICAN AMERICAN: 35 mL/min — AB (ref >60)
GFR, EST NON AFRICAN AMERICAN: 30 mL/min — AB (ref >60)
Glucose, Bld: 132 mg/dL — ABNORMAL HIGH (ref 65–99)
POTASSIUM: 4 mmol/L (ref 3.5–5.1)
Sodium: 136 mmol/L (ref 135–145)

## 2016-08-24 LAB — CBC
HCT: 30.8 % — ABNORMAL LOW (ref 36.0–46.0)
Hemoglobin: 10.1 g/dL — ABNORMAL LOW (ref 12.0–15.0)
MCH: 29.8 pg (ref 26.0–34.0)
MCHC: 32.8 g/dL (ref 30.0–36.0)
MCV: 90.9 fL (ref 78.0–100.0)
PLATELETS: 128 10*3/uL — AB (ref 150–400)
RBC: 3.39 MIL/uL — AB (ref 3.87–5.11)
RDW: 13 % (ref 11.5–15.5)
WBC: 3.7 10*3/uL — AB (ref 4.0–10.5)

## 2016-08-24 LAB — GLUCOSE, CAPILLARY
Glucose-Capillary: 127 mg/dL — ABNORMAL HIGH (ref 65–99)
Glucose-Capillary: 141 mg/dL — ABNORMAL HIGH (ref 65–99)
Glucose-Capillary: 150 mg/dL — ABNORMAL HIGH (ref 65–99)
Glucose-Capillary: 163 mg/dL — ABNORMAL HIGH (ref 65–99)
Glucose-Capillary: 219 mg/dL — ABNORMAL HIGH (ref 65–99)

## 2016-08-24 MED ORDER — HYDRALAZINE HCL 20 MG/ML IJ SOLN
5.0000 mg | Freq: Once | INTRAMUSCULAR | Status: AC
  Administered 2016-08-24: 5 mg via INTRAVENOUS
  Filled 2016-08-24: qty 0.25

## 2016-08-24 MED ORDER — IPRATROPIUM-ALBUTEROL 0.5-2.5 (3) MG/3ML IN SOLN
3.0000 mL | Freq: Two times a day (BID) | RESPIRATORY_TRACT | Status: DC
  Administered 2016-08-24 – 2016-08-26 (×5): 3 mL via RESPIRATORY_TRACT
  Filled 2016-08-24 (×5): qty 3

## 2016-08-24 MED ORDER — FLUTICASONE PROPIONATE 50 MCG/ACT NA SUSP
1.0000 | Freq: Every day | NASAL | Status: DC
  Administered 2016-08-24 – 2016-08-26 (×3): 1 via NASAL
  Filled 2016-08-24: qty 16

## 2016-08-24 MED ORDER — SODIUM CHLORIDE 0.9 % IV BOLUS (SEPSIS)
250.0000 mL | Freq: Once | INTRAVENOUS | Status: AC
  Administered 2016-08-24: 250 mL via INTRAVENOUS

## 2016-08-24 NOTE — Progress Notes (Signed)
BP is elevated at 191/57, temp is 103.3. Tylenol given. Family has several blankets on patient. Removed blankets, placed cool cloth and ice packs. Advised family to not place so many blankets when patient has temp. Patient was assisted to [facility] just before vitals and back to bed and is short of breath with exertion. Provider on call paged.

## 2016-08-24 NOTE — Progress Notes (Signed)
Occupational Therapy Evaluation Patient Details Name: Jocelyn Mendez MRN: [id number] DOB: 1930/08/24 Today's Date: 08/24/2016    History of Present Illness  83 y.o. female with medical history significant of hypertension, hyperlipidemia, OA, lumbago, neuropathy, DM who presents with 1 day history of confusion and found to have sepsis secondary to UTI.   Clinical Impression   Pt admitted with sepsis/ UTI.  Pt currently with functional limitations due to the deficits listed below (see OT Problem List).  Pt will benefit from skilled OT to increase their safety and independence with ADL and functional mobility for ADL to facilitate discharge to venue listed below.      Follow Up Recommendations  No OT follow up    Equipment Recommendations  3 in 1 bedside commode       Precautions / Restrictions Precautions Precautions: Fall Restrictions Weight Bearing Restrictions: No      Mobility Bed Mobility Overal bed mobility: Modified Independent                Transfers Overall transfer level: Needs assistance Equipment used: None Transfers: Sit to/from Stand Sit to Stand: Min assist         General transfer comment: min/guard for safety        ADL either performed or assessed with clinical judgement   ADL Overall ADL's : Needs assistance/impaired Eating/Feeding: Set up;Sitting Eating/Feeding Details (indicate cue type and reason): OT did note some tremors with self feeding- pt states this comes and goes but was usually not limiting Grooming: Sitting;Set up                   Toilet Transfer: Minimal assistance;BSC;Cueing for sequencing;Cueing for safety   Toileting- Clothing Manipulation and Hygiene: Minimal assistance;Sit to/from stand;Cueing for safety;Cueing for sequencing         General ADL Comments: pt very sleepy this OT session. States son able to provide 24/7 A                   Pertinent Vitals/Pain Pain Assessment: No/denies pain      Hand Dominance     Extremity/Trunk Assessment Upper Extremity Assessment Upper Extremity Assessment: Generalized weakness           Communication Communication Communication: No difficulties   Cognition Arousal/Alertness: Awake/alert Behavior During Therapy: WFL for tasks assessed/performed Overall Cognitive Status: Within Functional Limits for tasks assessed                                                Home Living Family/patient expects to be discharged to:: Private residence Living Arrangements: Children Available Help at Discharge: Family;Available 24 hours/day Type of Home: Mobile home Home Access: Stairs to enter Entrance Stairs-Number of Steps: 2 Entrance Stairs-Rails: Right;Left;Can reach both Home Layout: One level     Bathroom Shower/Tub: Tub/shower unit         Home Equipment: None          Prior Functioning/Environment Level of Independence: Independent                 OT Problem List: Decreased strength;Decreased activity tolerance      OT Treatment/Interventions: Self-care/ADL training;Patient/family education;DME and/or AE instruction    OT Goals(Current goals can be found in the care plan section) Acute Rehab OT Goals Patient Stated Goal: go home OT Goal Formulation: With patient Time  For Goal Achievement: 09/07/16  OT Frequency: Min 2X/week   Barriers to D/C:               AM-PAC PT "6 Clicks" Daily Activity     Outcome Measure Help from another person eating meals?: None Help from another person taking care of personal grooming?: A Little Help from another person toileting, which includes using toliet, bedpan, or urinal?: A Little Help from another person bathing (including washing, rinsing, drying)?: A Little Help from another person to put on and taking off regular upper body clothing?: A Little Help from another person to put on and taking off regular lower body clothing?: A Little 6 Click  Score: 19   End of Session Equipment Utilized During Treatment: Rolling walker Nurse Communication: Mobility status  Activity Tolerance: Patient tolerated treatment well Patient left: in bed;with call bell/phone within reach;with bed alarm set  OT Visit Diagnosis: Unsteadiness on feet (R26.81);Muscle weakness (generalized) (M62.81)      Charges:  OT Evaluation $OT Eval Low Complexity: 1 Procedure G-Codes: OT G-codes **NOT FOR INPATIENT CLASS** Functional Assessment Tool Used: Clinical judgement Functional Limitation: Self care Self Care Current Status (Y1749): At least 20 percent but less than 40 percent impaired, limited or restricted Self Care Goal Status (S4967): At least 1 percent but less than 20 percent impaired, limited or restricted   Kari Baars, [place]  Payton Mccallum D 08/24/2016, 9:59 AM

## 2016-08-24 NOTE — Progress Notes (Signed)
PROGRESS NOTE  Jocelyn Mendez:[id number] DOB: Dec 29, 1930 DOA: 08/23/2016 PCP: Hoyt Koch, MD  HPI/Recap of past 24 hours:  Oriented x3 , but drowsy, with tremors She denies pain, she report some urinary frequency recently  Assessment/Plan: Active Problems:   Acute encephalopathy   Acute metabolic encephalopathy -Confused at home, not acting normal, couldn't get words out. Current neuro exam unremarkable and speech fluent and oriented to self, place, time.  -patient refused MRI ( I have to be put into sleep for MRI),. She agreed to CT head,  -ct head no acute findings -encephalopathy likely from uti   Sepsis secondary to UTI -Fever 102.2, tachycardic, tachypneic on presentation  -Urine culture and blood culture pending -Rocephin -IVF    Wheezing: anterior wheezing, upper airway sounds? She does has h/o asthma, cxr no acute findings, will get respiratory viral panel Nebs for now  Insulin dependent Diabetes type 2  -a1c 7.8 -Lantus, sliding scale insulin  CKD stage 3 -Baseline Cr 1.5-1.6 -Stable   Hypertension -Continue Norvasc  Hyperlipidemia -Continue Crestor  Hypothyroidism -Continue Synthroid   Tremors: report has been chronic for at lease a few months, refer to neurology outpatient for tremor eval   DVT prophylaxis: lovenox  Code Status: DNI. Okay with CPR/defib.   Family Communication: son , daughter in room Disposition Plan: patient lives with family, per family patient always has someone stay with her   Consultants:  none  Procedures:  none  Antibiotics:  rocephin   Objective: BP (!) 118/55 (BP Location: Left Arm)   Pulse 87   Temp (!) 100.7 F (38.2 C) (Oral)   Resp 20   Ht 5\' 6"  (1.676 m)   Wt 78.9 kg (173 lb 15.1 oz)   SpO2 94%   BMI 28.08 kg/m   Intake/Output Summary (Last 24 hours) at 08/24/16 1430 Last data filed at 08/24/16 1224  Gross per 24 hour  Intake              605 ml  Output               900 ml  Net             -295 ml   Filed Weights   08/23/16 0521 08/23/16 0950 08/24/16 0653  Weight: 77.1 kg (170 lb) 78.9 kg (173 lb 15.1 oz) 78.9 kg (173 lb 15.1 oz)    Exam:   General:  Lethargic, but oriented x3, very frail  Cardiovascular: RRR  Respiratory: some anterior wheezing  Abdomen: Soft/ND/NT, positive BS  Musculoskeletal: No Edema  Neuro: Lethargic, but oriented x3, no focal deficit  Data Reviewed: Basic Metabolic Panel:  Recent Labs Lab 08/23/16 0543 08/24/16 0535  NA 135 136  K 4.0 4.0  CL 104 107  CO2 21* 21*  GLUCOSE 200* 132*  BUN 24* 19  CREATININE 1.63* 1.54*  CALCIUM 9.5 8.8*   Liver Function Tests:  Recent Labs Lab 08/23/16 0543  AST 32  ALT 18  ALKPHOS 36*  BILITOT 0.4  PROT 7.4  ALBUMIN 4.0   No results for input(s): LIPASE, AMYLASE in the last 168 hours. No results for input(s): AMMONIA in the last 168 hours. CBC:  Recent Labs Lab 08/23/16 0543 08/24/16 0535  WBC 4.7 3.7*  HGB 11.4* 10.1*  HCT 34.0* 30.8*  MCV 89.9 90.9  PLT 154 128*   Cardiac Enzymes:   No results for input(s): CKTOTAL, CKMB, CKMBINDEX, TROPONINI in the last 168 hours. BNP (last 3 results) No  results for input(s): BNP in the last 8760 hours.  ProBNP (last 3 results) No results for input(s): PROBNP in the last 8760 hours.  CBG:  Recent Labs Lab 08/23/16 1645 08/23/16 2141 08/24/16 0053 08/24/16 0747 08/24/16 1150  GLUCAP 143* 143* 141* 127* 219*    Recent Results (from the past 240 hour(s))  Urine culture     Status: None (Preliminary result)   Collection Time: 08/23/16  6:20 AM  Result Value Ref Range Status   Specimen Description URINE, CATHETERIZED  Final   Special Requests NONE  Final   Culture   Final    CULTURE REINCUBATED FOR BETTER GROWTH Performed at [facility], [place] [address], [place], [place] [address]    Report Status PENDING  Incomplete     Studies: Ct Head Wo Contrast  Result Date:  08/24/2016 CLINICAL DATA:  Confusion EXAM: CT HEAD WITHOUT CONTRAST TECHNIQUE: Contiguous axial images were obtained from the base of the skull through the vertex without intravenous contrast. COMPARISON:  11/08/2008 FINDINGS: Brain: No acute intracranial abnormality. Specifically, no hemorrhage, hydrocephalus, mass lesion, acute infarction, or significant intracranial injury. Vascular: No hyperdense vessel or unexpected calcification. Skull: No acute calvarial abnormality. Sinuses/Orbits: Mild mucosal thickening in the maxillary sinuses. Other: None IMPRESSION: No acute intracranial abnormality. Chronic sinusitis. Electronically Signed   By: Rolm Baptise M.D.   On: 08/24/2016 08:50    Scheduled Meds: . amLODipine  5 mg Oral Daily  . aspirin  81 mg Oral Daily  . enoxaparin (LOVENOX) injection  30 mg Subcutaneous Q24H  . fluticasone  1 spray Each Nare Daily  . insulin aspart  0-9 Units Subcutaneous TID WC  . insulin glargine  25 Units Subcutaneous QHS  . ipratropium-albuterol  3 mL Nebulization BID  . latanoprost  1 drop Both Eyes QHS  . levothyroxine  175 mcg Oral QAC breakfast  . rosuvastatin  10 mg Oral q1800    Continuous Infusions: . sodium chloride 100 mL/hr at 08/24/16 0759  . cefTRIAXone (ROCEPHIN)  IV Stopped (08/24/16 4496)     Time spent: 39mins  Adora Yeh MD, PhD  [facility] Pager [phone]. If 7PM-7AM, please contact night-coverage at [email or web address], password [facility] 08/24/2016, 2:30 PM  LOS: 0 days

## 2016-08-24 NOTE — Progress Notes (Signed)
Manual BP 188/62 at 2019. Recheck manual at 2040 184/60, provider on call paged with manual results. IV bolus 250cc NS started per provider orders. Patient has cool cloths. Family at bedside. Will c/t monitor.

## 2016-08-25 ENCOUNTER — Inpatient Hospital Stay (HOSPITAL_COMMUNITY): Payer: Medicare Other

## 2016-08-25 DIAGNOSIS — N183 Chronic kidney disease, stage 3 (moderate): Secondary | ICD-10-CM

## 2016-08-25 LAB — CBC WITH DIFFERENTIAL/PLATELET
BASOS ABS: 0 10*3/uL (ref 0.0–0.1)
BASOS PCT: 0 %
Eosinophils Absolute: 0 10*3/uL (ref 0.0–0.7)
Eosinophils Relative: 0 %
HEMATOCRIT: 30.3 % — AB (ref 36.0–46.0)
Hemoglobin: 10.1 g/dL — ABNORMAL LOW (ref 12.0–15.0)
Lymphocytes Relative: 15 %
Lymphs Abs: 1.2 10*3/uL (ref 0.7–4.0)
MCH: 29.7 pg (ref 26.0–34.0)
MCHC: 33.3 g/dL (ref 30.0–36.0)
MCV: 89.1 fL (ref 78.0–100.0)
MONO ABS: 0.9 10*3/uL (ref 0.1–1.0)
Monocytes Relative: 11 %
NEUTROS ABS: 6 10*3/uL (ref 1.7–7.7)
NEUTROS PCT: 74 %
Platelets: 129 10*3/uL — ABNORMAL LOW (ref 150–400)
RBC: 3.4 MIL/uL — ABNORMAL LOW (ref 3.87–5.11)
RDW: 13 % (ref 11.5–15.5)
WBC: 8.2 10*3/uL (ref 4.0–10.5)

## 2016-08-25 LAB — RESPIRATORY PANEL BY PCR
Adenovirus: NOT DETECTED
BORDETELLA PERTUSSIS-RVPCR: NOT DETECTED
CHLAMYDOPHILA PNEUMONIAE-RVPPCR: NOT DETECTED
Coronavirus 229E: NOT DETECTED
Coronavirus HKU1: NOT DETECTED
Coronavirus NL63: NOT DETECTED
Coronavirus OC43: NOT DETECTED
INFLUENZA A-RVPPCR: NOT DETECTED
INFLUENZA B-RVPPCR: NOT DETECTED
MYCOPLASMA PNEUMONIAE-RVPPCR: NOT DETECTED
Metapneumovirus: NOT DETECTED
PARAINFLUENZA VIRUS 4-RVPPCR: NOT DETECTED
Parainfluenza Virus 1: NOT DETECTED
Parainfluenza Virus 2: NOT DETECTED
Parainfluenza Virus 3: NOT DETECTED
Respiratory Syncytial Virus: NOT DETECTED
Rhinovirus / Enterovirus: NOT DETECTED

## 2016-08-25 LAB — GLUCOSE, CAPILLARY
GLUCOSE-CAPILLARY: 158 mg/dL — AB (ref 65–99)
GLUCOSE-CAPILLARY: 138 mg/dL — AB (ref 65–99)
Glucose-Capillary: 155 mg/dL — ABNORMAL HIGH (ref 65–99)
Glucose-Capillary: 134 mg/dL — ABNORMAL HIGH (ref 65–99)

## 2016-08-25 LAB — BASIC METABOLIC PANEL
ANION GAP: 9 (ref 5–15)
BUN: 15 mg/dL (ref 6–20)
CALCIUM: 8.5 mg/dL — AB (ref 8.9–10.3)
CO2: 18 mmol/L — AB (ref 22–32)
Chloride: 106 mmol/L (ref 101–111)
Creatinine, Ser: 1.56 mg/dL — ABNORMAL HIGH (ref 0.44–1.00)
GFR, EST AFRICAN AMERICAN: 34 mL/min — AB (ref >60)
GFR, EST NON AFRICAN AMERICAN: 30 mL/min — AB (ref >60)
Glucose, Bld: 169 mg/dL — ABNORMAL HIGH (ref 65–99)
Potassium: 3.8 mmol/L (ref 3.5–5.1)
Sodium: 133 mmol/L — ABNORMAL LOW (ref 135–145)

## 2016-08-25 LAB — LACTIC ACID, PLASMA: Lactic Acid, Venous: 0.9 mmol/L (ref 0.5–1.9)

## 2016-08-25 LAB — MRSA PCR SCREENING: MRSA by PCR: NEGATIVE

## 2016-08-25 MED ORDER — VANCOMYCIN HCL IN DEXTROSE 750-5 MG/150ML-% IV SOLN
750.0000 mg | INTRAVENOUS | Status: DC
  Administered 2016-08-25: 750 mg via INTRAVENOUS
  Filled 2016-08-25: qty 150

## 2016-08-25 MED ORDER — METOPROLOL TARTRATE 12.5 MG HALF TABLET
12.5000 mg | ORAL_TABLET | Freq: Two times a day (BID) | ORAL | Status: DC
  Administered 2016-08-25 – 2016-08-26 (×3): 12.5 mg via ORAL
  Filled 2016-08-25 (×3): qty 1

## 2016-08-25 MED ORDER — PIPERACILLIN-TAZOBACTAM 3.375 G IVPB
3.3750 g | Freq: Three times a day (TID) | INTRAVENOUS | Status: DC
  Filled 2016-08-25: qty 50

## 2016-08-25 MED ORDER — DEXTROSE 5 % IV SOLN
1.0000 g | INTRAVENOUS | Status: DC
  Administered 2016-08-25: 1 g via INTRAVENOUS
  Filled 2016-08-25 (×2): qty 1

## 2016-08-25 NOTE — Progress Notes (Signed)
OT Cancellation Note  Patient Details Name: Jocelyn Mendez MRN: [id number] DOB: 1931/01/25   Cancelled Treatment:    Reason Eval/Treat Not Completed: Fatigue/lethargy limiting ability to participate  Carlyon Shadow, OT [phone] 08/25/2016, 3:39 PM

## 2016-08-25 NOTE — Progress Notes (Signed)
PROGRESS NOTE  Jocelyn Mendez UXN:[id number] DOB: [birth year] DOA: 08/23/2016 PCP: Hoyt Koch, MD  HPI/Recap of past 24 hours:  Continue spiking fever, Oriented x3 , but drowsy, with tremors She denies pain,   Assessment/Plan: Active Problems:   Acute encephalopathy   Encephalopathy   Acute metabolic encephalopathy -Confused at home, not acting normal, couldn't get words out. Current neuro exam unremarkable and speech fluent and oriented to self, place, time.  -patient refused MRI ( I have to be put into sleep for MRI),. CT head no acute findings, does show chronic sinusitis -encephalopathy likely from uti   Sepsis secondary to UTI -Fever 102.2, tachycardic, tachypneic on presentation  -Urine culture and blood culture pending -due to continue spiking high fevers despite rocephin, ct ab/renal stone study ordered, abx broadened to vanc/cefepime for now -IVF    Wheezing: anterior wheezing, upper airway sounds? -She does has h/o asthma, cxr no acute findings, respiratory viral panel negative -no wheezing on 7/16, continue Nebs for now  Insulin dependent Diabetes type 2  -a1c 7.8 -Lantus, sliding scale insulin  CKD stage 3 -Baseline Cr 1.5-1.6 -Stable   Hypertension -Continue Norvasc  Hyperlipidemia -Continue Crestor  Hypothyroidism -Continue Synthroid   Tremors: report has been chronic for at lease a few months, refer to neurology outpatient for tremor eval   DVT prophylaxis: lovenox  Code Status: DNI. Okay with CPR/defib.   Family Communication: son , daughter in room Disposition Plan: patient lives with family, per family patient always has someone stay with her   Consultants:  none  Procedures:  none  Antibiotics:  Rocephin from admission to 7/16  Vanc/cefepime from 7/16   Objective: BP (!) 168/58 (BP Location: Right Arm)   Pulse 99   Temp (!) 102.9 F (39.4 C) (Oral)   Resp 20   Ht 5\' 6"  (1.676 m)   Wt 78.9 kg (173  lb 15.1 oz)   SpO2 94%   BMI 28.08 kg/m   Intake/Output Summary (Last 24 hours) at 08/25/16 0804 Last data filed at 08/25/16 0756  Gross per 24 hour  Intake             2705 ml  Output              600 ml  Net             2105 ml   Filed Weights   08/23/16 0521 08/23/16 0950 08/24/16 0653  Weight: 77.1 kg (170 lb) 78.9 kg (173 lb 15.1 oz) 78.9 kg (173 lb 15.1 oz)    Exam:   General:  Lethargic, but oriented x3, very frail  Cardiovascular: RRR  Respiratory: some anterior wheezing heard on 7/15 seems has resolved  Abdomen: Soft/ND/NT, positive BS  Musculoskeletal: No Edema  Neuro: Lethargic, but oriented x3, no focal deficit  Data Reviewed: Basic Metabolic Panel:  Recent Labs Lab 08/23/16 0543 08/24/16 0535 08/25/16 0512  NA 135 136 133*  K 4.0 4.0 3.8  CL 104 107 106  CO2 21* 21* 18*  GLUCOSE 200* 132* 169*  BUN 24* 19 15  CREATININE 1.63* 1.54* 1.56*  CALCIUM 9.5 8.8* 8.5*   Liver Function Tests:  Recent Labs Lab 08/23/16 0543  AST 32  ALT 18  ALKPHOS 36*  BILITOT 0.4  PROT 7.4  ALBUMIN 4.0   No results for input(s): LIPASE, AMYLASE in the last 168 hours. No results for input(s): AMMONIA in the last 168 hours. CBC:  Recent Labs Lab 08/23/16 0543 08/24/16 0535  08/25/16 0512  WBC 4.7 3.7* 8.2  NEUTROABS  --   --  6.0  HGB 11.4* 10.1* 10.1*  HCT 34.0* 30.8* 30.3*  MCV 89.9 90.9 89.1  PLT 154 128* 129*   Cardiac Enzymes:   No results for input(s): CKTOTAL, CKMB, CKMBINDEX, TROPONINI in the last 168 hours. BNP (last 3 results) No results for input(s): BNP in the last 8760 hours.  ProBNP (last 3 results) No results for input(s): PROBNP in the last 8760 hours.  CBG:  Recent Labs Lab 08/24/16 0747 08/24/16 1150 08/24/16 1612 08/24/16 2142 08/25/16 0750  GLUCAP 127* 219* 150* 163* 158*    Recent Results (from the past 240 hour(s))  Culture, blood (routine x 2)     Status: None (Preliminary result)   Collection Time: 08/23/16   5:44 AM  Result Value Ref Range Status   Specimen Description BLOOD BLOOD RIGHT FOREARM  Final   Special Requests   Final    BOTTLES DRAWN AEROBIC AND ANAEROBIC Blood Culture adequate volume   Culture   Final    NO GROWTH 1 DAY Performed at [facility], [place] [address] [place], [place] [address]    Report Status PENDING  Incomplete  Culture, blood (routine x 2)     Status: None (Preliminary result)   Collection Time: 08/23/16  5:49 AM  Result Value Ref Range Status   Specimen Description BLOOD BLOOD LEFT FOREARM  Final   Special Requests IN PEDIATRIC BOTTLE Blood Culture adequate volume  Final   Culture   Final    NO GROWTH 1 DAY Performed at [facility], [place] [address] [place], [place] [address]    Report Status PENDING  Incomplete  Urine culture     Status: None (Preliminary result)   Collection Time: 08/23/16  6:20 AM  Result Value Ref Range Status   Specimen Description URINE, CATHETERIZED  Final   Special Requests NONE  Final   Culture   Final    CULTURE REINCUBATED FOR BETTER GROWTH Performed at [facility], [place] [address] [place], [place] [address]    Report Status PENDING  Incomplete     Studies: Ct Head Wo Contrast  Result Date: 08/24/2016 CLINICAL DATA:  Confusion EXAM: CT HEAD WITHOUT CONTRAST TECHNIQUE: Contiguous axial images were obtained from the base of the skull through the vertex without intravenous contrast. COMPARISON:  11/08/2008 FINDINGS: Brain: No acute intracranial abnormality. Specifically, no hemorrhage, hydrocephalus, mass lesion, acute infarction, or significant intracranial injury. Vascular: No hyperdense vessel or unexpected calcification. Skull: No acute calvarial abnormality. Sinuses/Orbits: Mild mucosal thickening in the maxillary sinuses. Other: None IMPRESSION: No acute intracranial abnormality. Chronic sinusitis. Electronically Signed   By: Rolm Baptise M.D.   On: 08/24/2016 08:50    Scheduled Meds: . amLODipine  5 mg  Oral Daily  . aspirin  81 mg Oral Daily  . enoxaparin (LOVENOX) injection  30 mg Subcutaneous Q24H  . fluticasone  1 spray Each Nare Daily  . insulin aspart  0-9 Units Subcutaneous TID WC  . insulin glargine  25 Units Subcutaneous QHS  . ipratropium-albuterol  3 mL Nebulization BID  . latanoprost  1 drop Both Eyes QHS  . levothyroxine  175 mcg Oral QAC breakfast  . metoprolol tartrate  12.5 mg Oral BID  . rosuvastatin  10 mg Oral q1800    Continuous Infusions: . sodium chloride 100 mL/hr at 08/24/16 0759     Time spent: 42mins  Mikalyn Hermida MD, PhD  [facility] Pager [phone]. If 7PM-7AM, please contact night-coverage at [email or web address], password [facility] 08/25/2016, 8:04 AM  LOS: 1 day

## 2016-08-25 NOTE — Progress Notes (Addendum)
Pharmacy Antibiotic Note  Jocelyn Mendez is a 83 y.o. female admitted on 08/23/2016 with sepsis.  Pharmacy has been consulted for vanc/cefepime dosing.  Plan: 1) Vancomycin 750mg  IV q24 - follow MRSA PCR and urine culture 2) Cefepime 1g IV q24   Height: 5\' 6"  (167.6 cm) Weight: 173 lb 15.1 oz (78.9 kg) IBW/kg (Calculated) : 59.3  Temp (24hrs), Avg:101.9 F (38.8 C), Min:100.2 F (37.9 C), Max:103.3 F (39.6 C)   Recent Labs Lab 08/23/16 0543 08/23/16 0552 08/24/16 0535 08/25/16 0512  WBC 4.7  --  3.7* 8.2  CREATININE 1.63*  --  1.54* 1.56*  LATICACIDVEN  --  1.07  --  0.9    Estimated Creatinine Clearance: 28.9 mL/min (A) (by C-G formula based on SCr of 1.56 mg/dL (H)).    Allergies  Allergen Reactions  . Codeine     REACTION: change in mental status  . Diphenhydramine Hcl     REACTION: itching  . Quinine     REACTION: abnormal sensations in face  . Sulfonamide Derivatives     REACTION: rash     Thank you for allowing pharmacy to be a part of this patient's care.   Adrian Saran, PharmD, BCPS Pager [phone] 08/25/2016 8:28 AM

## 2016-08-25 NOTE — Care Management Note (Signed)
Case Management Note  Patient Details  Name: Jocelyn Mendez MRN: [id number] Date of Birth: 1930/08/27  Subjective/Objective:        uro sepsis            Action/Plan: Date:  August 25, 2016  Chart reviewed for concurrent status and case management needs.  Will continue to follow patient progress.  Discharge Planning: following for needs /lives with adult children. Expected discharge date: [id number]  Velva Harman, BSN, [place], [place]   Expected Discharge Date:                  Expected Discharge Plan:  Home/Self Care  In-House Referral:  Clinical Social Work  Discharge planning Services  CM Consult  Post Acute Care Choice:    Choice offered to:     DME Arranged:    DME Agency:     HH Arranged:    [place] Agency:     Status of Service:  In process, will continue to follow  If discussed at Long Length of Stay Meetings, dates discussed:    Additional Comments:  Leeroy Cha, RN 08/25/2016, 9:13 AM

## 2016-08-25 NOTE — Progress Notes (Signed)
BP 168/58 manual and Temp 102.9. Provider on call paged and day shift notified. Will c/t monitor.

## 2016-08-25 NOTE — Progress Notes (Signed)
PT Cancellation Note  Patient Details Name: KARAH CARUTHERS MRN: [id number] DOB: 11-25-1930   Cancelled Treatment:     pt running a fever and "not looking well".  Pt has been evaluated by LPT with no f/u rec.  Will cont to monitor for mobility.   Rica Koyanagi  PTA [facility]  Acute  Rehab Pager      [phone]

## 2016-08-26 ENCOUNTER — Inpatient Hospital Stay (HOSPITAL_COMMUNITY)
Admission: EM | Admit: 2016-08-26 | Discharge: 2016-08-29 | DRG: 291 | Disposition: A | Discharge status: Home Health | Payer: Medicare Other | Attending: Internal Medicine | Admitting: Internal Medicine

## 2016-08-26 ENCOUNTER — Encounter (HOSPITAL_COMMUNITY): Payer: Self-pay

## 2016-08-26 ENCOUNTER — Emergency Department (HOSPITAL_COMMUNITY): Payer: Medicare Other

## 2016-08-26 DIAGNOSIS — I10 Essential (primary) hypertension: Secondary | ICD-10-CM | POA: Diagnosis present

## 2016-08-26 DIAGNOSIS — R269 Unspecified abnormalities of gait and mobility: Secondary | ICD-10-CM

## 2016-08-26 DIAGNOSIS — I130 Hypertensive heart and chronic kidney disease with heart failure and stage 1 through stage 4 chronic kidney disease, or unspecified chronic kidney disease: Principal | ICD-10-CM | POA: Diagnosis present

## 2016-08-26 DIAGNOSIS — E119 Type 2 diabetes mellitus without complications: Secondary | ICD-10-CM

## 2016-08-26 DIAGNOSIS — E1140 Type 2 diabetes mellitus with diabetic neuropathy, unspecified: Secondary | ICD-10-CM | POA: Diagnosis present

## 2016-08-26 DIAGNOSIS — J441 Chronic obstructive pulmonary disease with (acute) exacerbation: Secondary | ICD-10-CM | POA: Diagnosis not present

## 2016-08-26 DIAGNOSIS — Z7951 Long term (current) use of inhaled steroids: Secondary | ICD-10-CM | POA: Diagnosis not present

## 2016-08-26 DIAGNOSIS — I5033 Acute on chronic diastolic (congestive) heart failure: Secondary | ICD-10-CM | POA: Diagnosis present

## 2016-08-26 DIAGNOSIS — N3000 Acute cystitis without hematuria: Secondary | ICD-10-CM

## 2016-08-26 DIAGNOSIS — R05 Cough: Secondary | ICD-10-CM | POA: Diagnosis not present

## 2016-08-26 DIAGNOSIS — R531 Weakness: Secondary | ICD-10-CM | POA: Diagnosis not present

## 2016-08-26 DIAGNOSIS — J9601 Acute respiratory failure with hypoxia: Secondary | ICD-10-CM | POA: Diagnosis not present

## 2016-08-26 DIAGNOSIS — E872 Acidosis, unspecified: Secondary | ICD-10-CM

## 2016-08-26 DIAGNOSIS — J440 Chronic obstructive pulmonary disease with acute lower respiratory infection: Secondary | ICD-10-CM | POA: Diagnosis not present

## 2016-08-26 DIAGNOSIS — Z79899 Other long term (current) drug therapy: Secondary | ICD-10-CM

## 2016-08-26 DIAGNOSIS — E785 Hyperlipidemia, unspecified: Secondary | ICD-10-CM | POA: Diagnosis present

## 2016-08-26 DIAGNOSIS — Z8249 Family history of ischemic heart disease and other diseases of the circulatory system: Secondary | ICD-10-CM

## 2016-08-26 DIAGNOSIS — Z23 Encounter for immunization: Secondary | ICD-10-CM | POA: Diagnosis not present

## 2016-08-26 DIAGNOSIS — J45901 Unspecified asthma with (acute) exacerbation: Secondary | ICD-10-CM | POA: Diagnosis present

## 2016-08-26 DIAGNOSIS — I5031 Acute diastolic (congestive) heart failure: Secondary | ICD-10-CM | POA: Diagnosis not present

## 2016-08-26 DIAGNOSIS — R197 Diarrhea, unspecified: Secondary | ICD-10-CM | POA: Diagnosis present

## 2016-08-26 DIAGNOSIS — J45909 Unspecified asthma, uncomplicated: Secondary | ICD-10-CM | POA: Diagnosis present

## 2016-08-26 DIAGNOSIS — Z833 Family history of diabetes mellitus: Secondary | ICD-10-CM

## 2016-08-26 DIAGNOSIS — Z7982 Long term (current) use of aspirin: Secondary | ICD-10-CM

## 2016-08-26 DIAGNOSIS — M810 Age-related osteoporosis without current pathological fracture: Secondary | ICD-10-CM | POA: Diagnosis present

## 2016-08-26 DIAGNOSIS — R0602 Shortness of breath: Secondary | ICD-10-CM | POA: Diagnosis not present

## 2016-08-26 DIAGNOSIS — Z794 Long term (current) use of insulin: Secondary | ICD-10-CM

## 2016-08-26 DIAGNOSIS — Z87891 Personal history of nicotine dependence: Secondary | ICD-10-CM

## 2016-08-26 DIAGNOSIS — Z9049 Acquired absence of other specified parts of digestive tract: Secondary | ICD-10-CM | POA: Diagnosis not present

## 2016-08-26 DIAGNOSIS — N183 Chronic kidney disease, stage 3 unspecified: Secondary | ICD-10-CM

## 2016-08-26 DIAGNOSIS — G9340 Encephalopathy, unspecified: Secondary | ICD-10-CM | POA: Diagnosis present

## 2016-08-26 DIAGNOSIS — E782 Mixed hyperlipidemia: Secondary | ICD-10-CM | POA: Diagnosis present

## 2016-08-26 DIAGNOSIS — J209 Acute bronchitis, unspecified: Secondary | ICD-10-CM | POA: Diagnosis not present

## 2016-08-26 DIAGNOSIS — N184 Chronic kidney disease, stage 4 (severe): Secondary | ICD-10-CM | POA: Diagnosis not present

## 2016-08-26 DIAGNOSIS — Z801 Family history of malignant neoplasm of trachea, bronchus and lung: Secondary | ICD-10-CM | POA: Diagnosis not present

## 2016-08-26 DIAGNOSIS — D638 Anemia in other chronic diseases classified elsewhere: Secondary | ICD-10-CM | POA: Diagnosis not present

## 2016-08-26 DIAGNOSIS — E1122 Type 2 diabetes mellitus with diabetic chronic kidney disease: Secondary | ICD-10-CM | POA: Diagnosis present

## 2016-08-26 DIAGNOSIS — E039 Hypothyroidism, unspecified: Secondary | ICD-10-CM | POA: Diagnosis present

## 2016-08-26 DIAGNOSIS — R509 Fever, unspecified: Secondary | ICD-10-CM | POA: Diagnosis not present

## 2016-08-26 DIAGNOSIS — A4151 Sepsis due to Escherichia coli [E. coli]: Secondary | ICD-10-CM

## 2016-08-26 LAB — CBC
HCT: 26.9 % — ABNORMAL LOW (ref 36.0–46.0)
Hemoglobin: 8.8 g/dL — ABNORMAL LOW (ref 12.0–15.0)
MCH: 29.4 pg (ref 26.0–34.0)
MCHC: 32.7 g/dL (ref 30.0–36.0)
MCV: 90 fL (ref 78.0–100.0)
Platelets: 124 10*3/uL — ABNORMAL LOW (ref 150–400)
RBC: 2.99 MIL/uL — ABNORMAL LOW (ref 3.87–5.11)
RDW: 13.4 % (ref 11.5–15.5)
WBC: 6.6 10*3/uL (ref 4.0–10.5)

## 2016-08-26 LAB — CBC WITH DIFFERENTIAL/PLATELET
BASOS PCT: 0 %
Basophils Absolute: 0 10*3/uL (ref 0.0–0.1)
EOS ABS: 0.1 10*3/uL (ref 0.0–0.7)
Eosinophils Relative: 1 %
HCT: 27.8 % — ABNORMAL LOW (ref 36.0–46.0)
HEMOGLOBIN: 9.2 g/dL — AB (ref 12.0–15.0)
Lymphocytes Relative: 13 %
Lymphs Abs: 0.9 10*3/uL (ref 0.7–4.0)
MCH: 30.2 pg (ref 26.0–34.0)
MCHC: 33.1 g/dL (ref 30.0–36.0)
MCV: 91.1 fL (ref 78.0–100.0)
MONOS PCT: 6 %
Monocytes Absolute: 0.4 10*3/uL (ref 0.1–1.0)
NEUTROS PCT: 80 %
Neutro Abs: 5.5 10*3/uL (ref 1.7–7.7)
PLATELETS: 150 10*3/uL (ref 150–400)
RBC: 3.05 MIL/uL — ABNORMAL LOW (ref 3.87–5.11)
RDW: 13.5 % (ref 11.5–15.5)
WBC: 6.8 10*3/uL (ref 4.0–10.5)

## 2016-08-26 LAB — COMPREHENSIVE METABOLIC PANEL
ALK PHOS: 32 U/L — AB (ref 38–126)
ALT: 18 U/L (ref 14–54)
AST: 34 U/L (ref 15–41)
Albumin: 3 g/dL — ABNORMAL LOW (ref 3.5–5.0)
Anion gap: 9 (ref 5–15)
BUN: 19 mg/dL (ref 6–20)
CALCIUM: 8.6 mg/dL — AB (ref 8.9–10.3)
CO2: 16 mmol/L — AB (ref 22–32)
CREATININE: 1.6 mg/dL — AB (ref 0.44–1.00)
Chloride: 107 mmol/L (ref 101–111)
GFR calc non Af Amer: 29 mL/min — ABNORMAL LOW (ref >60)
GFR, EST AFRICAN AMERICAN: 33 mL/min — AB (ref >60)
Glucose, Bld: 122 mg/dL — ABNORMAL HIGH (ref 65–99)
Potassium: 3.9 mmol/L (ref 3.5–5.1)
Sodium: 132 mmol/L — ABNORMAL LOW (ref 135–145)
Total Bilirubin: 0.7 mg/dL (ref 0.3–1.2)
Total Protein: 6.3 g/dL — ABNORMAL LOW (ref 6.5–8.1)

## 2016-08-26 LAB — URINALYSIS, ROUTINE W REFLEX MICROSCOPIC
Bacteria, UA: NONE SEEN
Bilirubin Urine: NEGATIVE
GLUCOSE, UA: NEGATIVE mg/dL
Ketones, ur: 5 mg/dL — AB
Nitrite: NEGATIVE
PH: 5 (ref 5.0–8.0)
PROTEIN: 100 mg/dL — AB
Specific Gravity, Urine: 1.018 (ref 1.005–1.030)

## 2016-08-26 LAB — BASIC METABOLIC PANEL
Anion gap: 9 (ref 5–15)
BUN: 18 mg/dL (ref 6–20)
CALCIUM: 8.3 mg/dL — AB (ref 8.9–10.3)
CO2: 18 mmol/L — ABNORMAL LOW (ref 22–32)
CREATININE: 1.58 mg/dL — AB (ref 0.44–1.00)
Chloride: 108 mmol/L (ref 101–111)
GFR calc non Af Amer: 29 mL/min — ABNORMAL LOW (ref >60)
GFR, EST AFRICAN AMERICAN: 34 mL/min — AB (ref >60)
Glucose, Bld: 70 mg/dL (ref 65–99)
Potassium: 3.7 mmol/L (ref 3.5–5.1)
SODIUM: 135 mmol/L (ref 135–145)

## 2016-08-26 LAB — GLUCOSE, CAPILLARY
GLUCOSE-CAPILLARY: 65 mg/dL (ref 65–99)
Glucose-Capillary: 69 mg/dL (ref 65–99)
Glucose-Capillary: 84 mg/dL (ref 65–99)
Glucose-Capillary: 112 mg/dL — ABNORMAL HIGH (ref 65–99)

## 2016-08-26 LAB — URINE CULTURE

## 2016-08-26 LAB — I-STAT CG4 LACTIC ACID, ED: Lactic Acid, Venous: 0.97 mmol/L (ref 0.5–1.9)

## 2016-08-26 MED ORDER — VITAMINS A & D EX OINT
TOPICAL_OINTMENT | CUTANEOUS | Status: AC
  Administered 2016-08-26: 1
  Filled 2016-08-26: qty 5

## 2016-08-26 MED ORDER — DEXTROSE 5 % IV SOLN
1.0000 g | Freq: Once | INTRAVENOUS | Status: AC
  Administered 2016-08-27: 1 g via INTRAVENOUS
  Filled 2016-08-26: qty 10

## 2016-08-26 MED ORDER — AMLODIPINE BESYLATE 5 MG PO TABS: 10.0000 mg | ORAL_TABLET | Freq: Every day | ORAL | 0 refills | Status: DC

## 2016-08-26 MED ORDER — AMOXICILLIN-POT CLAVULANATE 500-125 MG PO TABS: 1.0000 | ORAL_TABLET | Freq: Two times a day (BID) | ORAL | 0 refills | Status: AC

## 2016-08-26 MED ORDER — SODIUM CHLORIDE 0.9 % IV BOLUS (SEPSIS)
1000.0000 mL | Freq: Once | INTRAVENOUS | Status: DC
  Administered 2016-08-27: 1000 mL via INTRAVENOUS

## 2016-08-26 MED ORDER — FLUTICASONE PROPIONATE 50 MCG/ACT NA SUSP: 1.0000 | Freq: Every day | NASAL | 0 refills | Status: DC

## 2016-08-26 MED ORDER — INSULIN GLARGINE 100 UNIT/ML ~~LOC~~ SOLN
12.0000 [IU] | Freq: Every day | SUBCUTANEOUS | Status: DC
  Filled 2016-08-26: qty 0.12

## 2016-08-26 MED ORDER — SACCHAROMYCES BOULARDII 250 MG PO CAPS: 250.0000 mg | ORAL_CAPSULE | Freq: Two times a day (BID) | ORAL | 0 refills | Status: DC

## 2016-08-26 MED ORDER — AMOXICILLIN-POT CLAVULANATE 500-125 MG PO TABS
1.0000 | ORAL_TABLET | Freq: Two times a day (BID) | ORAL | Status: DC
  Administered 2016-08-26: 500 mg via ORAL
  Filled 2016-08-26: qty 1

## 2016-08-26 NOTE — Progress Notes (Signed)
CRITICAL VALUE ALERT  Critical Value:  CBG 65  Date & Time Notied:  08/26/2016 at 0755  Provider Notified: XU  Orders Received/Actions taken: Patient given 4 oz of orange juice. Rechecked CBG 69. Patient is started breakfast. Will recheck CBG in 15 mins.

## 2016-08-26 NOTE — ED Provider Notes (Addendum)
TIME SEEN: 11:17 PM  CHIEF COMPLAINT: Fever  HPI: Patient is an 83 year old female with history of hypertension, type 2 diabetes, hyperlipidemia who presents emergency department with her family for concerns for confusion and continued fevers. Patient was just admitted to the hospital for Escherichia coli urinary tract infection and was discharged on antibiotics. She had a dose of antibiotic this morning prior to discharge. Family reports they are only home for a partially 3-4 hours when patient began being confused and "talking out of her head". They state that she spiked a fever to 101. They gave her 2 Tylenols at 4 PM. They state that she has been very weak and they are worried about her continued confusion and continued anorexia. They do not comfortable with patient being home with them at this time. They state the patient has been "panting" whenever she is lying flat. Patient currently denies any pain. She denies feeling short of breath. No vomiting or diarrhea.  ROS: See HPI Constitutional:  fever  Eyes: no drainage  ENT: no runny nose   Cardiovascular:  no chest pain  Resp: no SOB  GI: no vomiting GU: no dysuria Integumentary: no rash  Allergy: no hives  Musculoskeletal: no leg swelling  Neurological: no slurred speech ROS otherwise negative  PAST MEDICAL HISTORY/PAST SURGICAL HISTORY:  Past Medical History:  Diagnosis Date  . Acute bronchitis   . Anemia of other chronic disease   . Anxiety state, unspecified   . Chest pain, unspecified   . Diverticulosis of colon (without mention of hemorrhage)   . Esophageal reflux   . Gallstones   . Generalized osteoarthrosis, unspecified site   . Headache(784.0)   . Lumbago   . Neuropathy in diabetes ([place])    bilat LE's  . Osteoporosis   . Other and unspecified hyperlipidemia   . Other B-complex deficiencies   . Type II or unspecified type diabetes mellitus without mention of complication, not stated as uncontrolled   . Unspecified  disorder resulting from impaired renal function   . Unspecified essential hypertension   . Unspecified hypothyroidism     MEDICATIONS:  Prior to Admission medications   Medication Sig Start Date End Date Taking? Authorizing Provider  albuterol (PROVENTIL HFA;VENTOLIN HFA) 108 (90 BASE) MCG/ACT inhaler Inhale 2 puffs into the lungs every 6 (six) hours as needed for wheezing or shortness of breath. 04/06/14   Hoyt Koch, MD  amLODipine (NORVASC) 5 MG tablet Take 2 tablets (10 mg total) by mouth daily. 08/26/16   Florencia Reasons, MD  amoxicillin-clavulanate (AUGMENTIN) 500-125 MG tablet Take 1 tablet (500 mg total) by mouth 2 (two) times daily. 08/26/16 08/30/16  Florencia Reasons, MD  aspirin 81 MG tablet Take 81 mg by mouth daily.      [provider]  Cholecalciferol (VITAMIN D3) 2000 UNITS TABS Take 1 tablet by mouth 2 (two) times daily.    [provider]  cyanocobalamin (,VITAMIN B-12,) 1000 MCG/ML injection INJECT 1ML INTO THE MUSCLE ONCE A MONTH 02/08/16   Hoyt Koch, MD  fenofibrate 160 MG tablet TAKE 1 TABLET BY MOUTH DAILY 05/06/16   Hoyt Koch, MD  ferrous sulfate 325 (65 FE) MG EC tablet Take 325 mg by mouth 2 (two) times daily.     [provider]  fluticasone (FLONASE) 50 MCG/ACT nasal spray Place 1 spray into both nostrils daily. 08/27/16   Florencia Reasons, MD  GLOBAL EASE INJECT PEN NEEDLES 32G X 4 MM MISC USE AS DIRECTED WITH THE  HUMULIN PEN INSULIN (100 DAY SUPPLY) 08/04/16   Renato Shin, MD  Insulin NPH, Human,, Isophane, (HUMULIN N) 100 UNIT/ML Kiwkpen Inject 110 Units into the skin every morning. 08/04/16   Renato Shin, MD  Insulin Pen Needle 31G X 8 MM MISC 1 each by Does not apply route. Use with insulin pen device, humulin    [provider]  Lancets (ONETOUCH ULTRASOFT) lancets Use as instructed 02/15/13   Noralee Space, MD  latanoprost (XALATAN) 0.005 % ophthalmic solution Place 1 drop into both eyes at bedtime. 08/12/16   [provider]  levothyroxine (SYNTHROID, LEVOTHROID) 175 MCG tablet TAKE 1 TABLET BY MOUTH EVERY DAY BEFORE BREAKFAST 06/29/16   Renato Shin, MD  NEEDLE, DISP, 25 G (BD DISP NEEDLES) 25G X 5/8" MISC Use as directed to administer  the b12 injection monthly 10/05/14   Hoyt Koch, MD  ONE TOUCH ULTRA TEST test strip USE TO TEST BLOOD SUGAR 4 TIMES DAILY DX: E10.22 07/21/16   Renato Shin, MD  rosuvastatin (CRESTOR) 20 MG tablet Take 1 tablet (20 mg total) by mouth daily. 04/02/16   Hoyt Koch, MD  saccharomyces boulardii (FLORASTOR) 250 MG capsule Take 1 capsule (250 mg total) by mouth 2 (two) times daily. 08/26/16   Florencia Reasons, MD    ALLERGIES:  Allergies  Allergen Reactions  . Codeine     REACTION: change in mental status  . Diphenhydramine Hcl     REACTION: itching  . Quinine     REACTION: abnormal sensations in face  . Sulfonamide Derivatives     REACTION: rash    SOCIAL HISTORY:  Social History  Substance Use Topics  . Smoking status: Former Smoker    Packs/day: 0.50    Years: 6.00    Types: Cigarettes    Quit date: 02/10/1973  . Smokeless tobacco: Former Systems developer    Types: Snuff    Quit date: 04/07/2010  . Alcohol use No    FAMILY HISTORY: Family History  Problem Relation Age of Onset  . Diabetes Mother   . Cancer Father        unsure ? stomach  . Heart attack Brother        x 3  . Cancer Sister        unsure type  . Lung cancer Brother        smoker  . Heart attack Sister     EXAM: BP (!) 146/48   Pulse 81   Temp 98.9 F (37.2 C)   Resp 18   Ht 5\' 6"  (1.676 m)   Wt 79.4 kg (175 lb)   SpO2 96%   BMI 28.25 kg/m  CONSTITUTIONAL: Alert and oriented 3 and responds appropriately to questions. Elderly, chronically ill-appearing, afebrile, no significant distress HEAD: Normocephalic EYES: Conjunctivae clear, pupils appear equal, EOMI ENT: normal nose; dry mucous membranes NECK: Supple, no meningismus, no nuchal rigidity, no LAD  CARD: RRR; S1  and S2 appreciated; no murmurs, no clicks, no rubs, no gallops RESP: Normal chest excursion without splinting, patient is mildly tachypneic; breath sounds clear and equal bilaterally; no wheezes, no rhonchi, no rales, no hypoxia or respiratory distress, speaking full sentences ABD/GI: Normal bowel sounds; non-distended; soft, non-tender, no rebound, no guarding, no peritoneal signs, no hepatosplenomegaly BACK:  The back appears normal and is non-tender to palpation, there is no CVA tenderness EXT: Normal ROM in all joints; non-tender to palpation; no edema; normal capillary refill; no cyanosis, no calf tenderness or swelling  SKIN: Normal color for age and race; warm; no rash NEURO: Moves all extremities equally, normal speech, cranial nerves II-12 intact, sensation light touch intact diffusely  PSYCH: The patient's mood and manner are appropriate. Grooming and personal hygiene are appropriate.  MEDICAL DECISION MAKING: Patient here with continued fever, intermittent altered mental status, recent discharge for Escherichia coli UTI. Blood cultures were negative. Here labs show progressive decrease in her bicarbonate with normal anion gap. Lactate is normal. She has chronic kidney disease which is unchanged. Her urine does appear to have improved compared to previous. Will repeat blood cultures. We'll give IV fluids and a dose of IV ceftriaxone. Her recent urine culture was pansensitive. Family is concerned about her and they do not feel she should come home with him at this time. This time she is neurologically intact, oriented 3. She denies any complaints currently. Discussed with family that I can discuss this with the hospitalist. Will give liter of IV fluids and dose of IV ceftriaxone.  ED PROGRESS: 12:52 AM Discussed patient's case with hospitalist, Dr. Tamala Julian.  I have recommended admission and patient (and family if present) agree with this plan. Admitting physician will place admission orders.     Patient does seem more short of breath after getting up to to the bathroom. Her oxygen saturation was 90% on room air at rest. She does have some mild intermittent wheezing. Has history of asthma. We'll give albuterol treatment. Chest x-ray showed small bilateral pleural effusions with atelectasis but no infiltrate. No edema. She has only received 100 mL of IV fluid. She states she is not feeling well and does not feel comfortable going home either. Dr. Tamala Julian will admit.   I reviewed all nursing notes, vitals, pertinent previous records, EKGs, lab and urine results, imaging (as available).   EKG Interpretation  Date/Time:  Wednesday August 27 2016 01:26:13 EDT Ventricular Rate:  99 PR Interval:  212 QRS Duration: 84 QT Interval:  356 QTC Calculation: 456 R Axis:   102 Text Interpretation:  Sinus rhythm with 1st degree A-V block with Premature atrial complexes Low voltage QRS Anterolateral infarct , age undetermined Abnormal ECG No significant change since last tracing Confirmed by Ward, Cyril Mourning [phone]) on 08/27/2016 1:55:58 AM              Ward, Delice Bison, DO 08/27/16 [place], Delice Bison, DO 08/27/16 [id number]

## 2016-08-26 NOTE — Discharge Summary (Signed)
Discharge Summary  Jocelyn Mendez FXT:[id number] DOB: 07/11/1930  PCP: Hoyt Koch, MD  Admit date: 08/23/2016 Discharge date: 08/26/2016  Time spent: >66mins, more than 50% time spent on coordination of care, patient and family counseling   Recommendations for Outpatient Follow-up:  1. F/u with PMD within a week  for hospital discharge follow up, repeat cbc/bmp at follow up 2. F/u with neurology for tremor evaluation 3. Home health ordered  Discharge Diagnoses:  Active Hospital Problems   Diagnosis Date Noted  . Encephalopathy 08/24/2016  . Acute encephalopathy 08/23/2016    Resolved Hospital Problems   Diagnosis Date Noted Date Resolved  No resolved problems to display.    Discharge Condition: stable  Diet recommendation: heart healthy/carb modified  Filed Weights   08/23/16 0521 08/23/16 0950 08/24/16 0653  Weight: 77.1 kg (170 lb) 78.9 kg (173 lb 15.1 oz) 78.9 kg (173 lb 15.1 oz)    History of present illness:  PCP: Hoyt Koch, MD  Patient coming from: Home  Chief Complaint: acute encephalopathy   HPI: Jocelyn Mendez is a 83 y.o. female with medical history significant of hypertension, hyperlipidemia who presents with 1 day history of confusion. She lives at home with her son. Daughter at bedside gives history and states that over the past 2 nights, patient has been very confused, not making any sense, having difficulty with speech getting words out. This has been fluctuating in nature. Currently, her mentation and speech difficulties have resolved in the emergency department. She has also had complaints of subjective fevers and chills at home. Denies any chest pain or shortness of breath, no cough or nausea, vomiting, diarrhea. She has chronic urinary incontinence, but denies any dysuria.   ED Course: Workup revealed urinary tract infection, started on Rocephin.  Hospital Course:  Active Problems:   Acute encephalopathy    Encephalopathy   Acute metabolic encephalopathy -Confused at home, not acting normal, couldn't get words out. .  -patient refused MRI ( I have to be put into sleep for MRI),. CT head no acute findings, does show chronic sinusitis -encephalopathy likely from uti  -confusion resolved, speech fluent and oriented to self, place, time at time of discharge  Sepsis secondary to UTI -Fever 102.2, tachycardic, tachypneic on presentation  -due to continue spiking high fevers despite rocephin, ct renal stone study no acute findings, abx broadened to vanc/cefepime  -Urine culture + pan sensitive Ecoli,  blood culture no growth, she has improved -fever resolved, no leukocytosis, she is discharged home on augmentin   Wheezing: anterior wheezing, upper airway sounds? -She does has h/o asthma, cxr no acute findings, respiratory viral panel negative -low chest imaging on ct renal stone study showed "Bibasilar subsegmental atelectasis with small pleural effusions greater on right. Peribronchial thickening greater left lower lobe ,may represent changes of bronchitis." -family report patient has chronic intermittent cough, she does not have hypoxia, she is discharged home on augmentin and continue home meds albuterol, she is to follow up with pmd  Insulin dependent Diabetes type 2  -a1c 7.8 -per chart review she is on high dose of insulin , she report just saw her diabetes doctor , she report her blood sugar is well controlled at home with home dose insulin -she did not require high dose insulin coverage here in the hospital , possibly due to diet discrepancies  -she is discharge on home insulin dose.  CKD stage 3 -Baseline Cr 1.5-1.6 -Stable   Hypertension -increase Norvasc  Hyperlipidemia -Continue Crestor  Hypothyroidism -Continue Synthroid   Tremors: report has been chronic for at lease a few months, refer to neurology outpatient for tremor eval   DVT prophylaxis while in the  hospital:lovenox  Code Status:DNI. Okay with CPR/defib.  Family Communication:daughter in room Disposition Plan:patient lives with family, per family patient always has someone stay with her Home health PT/RN ordered   Consultants:  none  Procedures:  none  Antibiotics:  Rocephin from admission to 7/16  Vanc/cefepime from 7/16   Discharge Exam: BP (!) 134/44 (BP Location: Right Arm)   Pulse 85   Temp 98.8 F (37.1 C) (Oral)   Resp 18   Ht 5\' 6"  (1.676 m)   Wt 78.9 kg (173 lb 15.1 oz)   SpO2 95%   BMI 28.08 kg/m   General: frail elderly, now fully awake, aaox3, able to provide reliable history Cardiovascular: RRR Respiratory: intermittent mild anterior wheezing, no wheezing at discharge   Discharge Instructions You were cared for by a hospitalist during your hospital stay. If you have any questions about your discharge medications or the care you received while you were in the hospital after you are discharged, you can call the unit and asked to speak with the hospitalist on call if the hospitalist that took care of you is not available. Once you are discharged, your primary care physician will handle any further medical issues. Please note that NO REFILLS for any discharge medications will be authorized once you are discharged, as it is imperative that you return to your primary care physician (or establish a relationship with a primary care physician if you do not have one) for your aftercare needs so that they can reassess your need for medications and monitor your lab values.  Discharge Instructions    DME Bedside commode    Complete by:  As directed    Patient needs a bedside commode to treat with the following condition:  Weakness   Diet - low sodium heart healthy    Complete by:  As directed    Carb modified   Face-to-face encounter (required for Medicare/Medicaid patients)    Complete by:  As directed    I Gianny Killman certify that this patient is under  my care and that I, or a nurse practitioner or physician's assistant working with me, had a face-to-face encounter that meets the physician face-to-face encounter requirements with this patient on 08/24/2016. The encounter with the patient was in whole, or in part for the following medical condition(s) which is the primary reason for home health care (List medical condition): FTT   The encounter with the patient was in whole, or in part, for the following medical condition, which is the primary reason for home health care:  FTT   I certify that, based on my findings, the following services are medically necessary home health services:  Nursing   Reason for Medically Necessary Home Health Services:  Skilled Nursing- Change/Decline in Patient Status   My clinical findings support the need for the above services:  OTHER SEE COMMENTS   Further, I certify that my clinical findings support that this patient is homebound due to:  Immunocompromised   Home Health    Complete by:  As directed    To provide the following care/treatments:   RN Social work PT     Increase activity slowly    Complete by:  As directed    Walker rolling    Complete by:  As directed      Allergies  as of 08/26/2016      Reactions   Codeine    REACTION: change in mental status   Diphenhydramine Hcl    REACTION: itching   Quinine    REACTION: abnormal sensations in face   Sulfonamide Derivatives    REACTION: rash      Medication List    TAKE these medications   albuterol 108 (90 Base) MCG/ACT inhaler Commonly known as:  PROVENTIL HFA;VENTOLIN HFA Inhale 2 puffs into the lungs every 6 (six) hours as needed for wheezing or shortness of breath.   amLODipine 5 MG tablet Commonly known as:  NORVASC Take 2 tablets (10 mg total) by mouth daily. What changed:  See the new instructions.   amoxicillin-clavulanate 500-125 MG tablet Commonly known as:  AUGMENTIN Take 1 tablet (500 mg total) by mouth 2 (two) times daily.     aspirin 81 MG tablet Take 81 mg by mouth daily.   cyanocobalamin 1000 MCG/ML injection Commonly known as:  (VITAMIN B-12) INJECT 1ML INTO THE MUSCLE ONCE A MONTH   fenofibrate 160 MG tablet TAKE 1 TABLET BY MOUTH DAILY   ferrous sulfate 325 (65 FE) MG EC tablet Take 325 mg by mouth 2 (two) times daily.   fluticasone 50 MCG/ACT nasal spray Commonly known as:  FLONASE Place 1 spray into both nostrils daily.   Insulin NPH (Human) (Isophane) 100 UNIT/ML Kiwkpen Commonly known as:  HUMULIN N Inject 110 Units into the skin every morning.   Insulin Pen Needle 31G X 8 MM Misc 1 each by Does not apply route. Use with insulin pen device, humulin   GLOBAL EASE INJECT PEN NEEDLES 32G X 4 MM Misc Generic drug:  Insulin Pen Needle USE AS DIRECTED WITH THE HUMULIN PEN INSULIN (100 DAY SUPPLY)   latanoprost 0.005 % ophthalmic solution Commonly known as:  XALATAN Place 1 drop into both eyes at bedtime.   levothyroxine 175 MCG tablet Commonly known as:  SYNTHROID, LEVOTHROID TAKE 1 TABLET BY MOUTH EVERY DAY BEFORE BREAKFAST   NEEDLE (DISP) 25 G 25G X 5/8" Misc Commonly known as:  BD DISP NEEDLES Use as directed to administer  the b12 injection monthly   ONE TOUCH ULTRA TEST test strip Generic drug:  glucose blood USE TO TEST BLOOD SUGAR 4 TIMES DAILY DX: E10.22   onetouch ultrasoft lancets Use as instructed   rosuvastatin 20 MG tablet Commonly known as:  CRESTOR Take 1 tablet (20 mg total) by mouth daily.   saccharomyces boulardii 250 MG capsule Commonly known as:  FLORASTOR Take 1 capsule (250 mg total) by mouth 2 (two) times daily.   Vitamin D3 2000 units Tabs Take 1 tablet by mouth 2 (two) times daily.            Durable Medical Equipment        Start     Ordered   08/24/16 0000  DME Bedside commode    Question:  Patient needs a bedside commode to treat with the following condition  Answer:  Weakness   08/24/16 1759     Allergies  Allergen Reactions  .  Codeine     REACTION: change in mental status  . Diphenhydramine Hcl     REACTION: itching  . Quinine     REACTION: abnormal sensations in face  . Sulfonamide Derivatives     REACTION: rash   Follow-up Information    [facility] Follow up in 1 month(s).   Why:  for chronic tremor evaluation Contact information:  [address]     [address] [place] [address] [phone]       Hoyt Koch, MD Follow up in 1 week(s).   Specialty:  Internal Medicine Why:  hospital discharge follow up, repeat cbc/bmp at follow up. please continue to work with your doctor for blood sugar control,  Contact information: [place] [address] [phone]            The results of significant diagnostics from this hospitalization (including imaging, microbiology, ancillary and laboratory) are listed below for reference.    Significant Diagnostic Studies: Ct Head Wo Contrast  Result Date: 08/24/2016 CLINICAL DATA:  Confusion EXAM: CT HEAD WITHOUT CONTRAST TECHNIQUE: Contiguous axial images were obtained from the base of the skull through the vertex without intravenous contrast. COMPARISON:  11/08/2008 FINDINGS: Brain: No acute intracranial abnormality. Specifically, no hemorrhage, hydrocephalus, mass lesion, acute infarction, or significant intracranial injury. Vascular: No hyperdense vessel or unexpected calcification. Skull: No acute calvarial abnormality. Sinuses/Orbits: Mild mucosal thickening in the maxillary sinuses. Other: None IMPRESSION: No acute intracranial abnormality. Chronic sinusitis. Electronically Signed   By: Rolm Baptise M.D.   On: 08/24/2016 08:50   Dg Chest Port 1 View  Result Date: 08/23/2016 CLINICAL DATA:  83 year old female with cough and chills. EXAM: PORTABLE CHEST 1 VIEW COMPARISON:  Chest radiograph dated 07/04/2015 FINDINGS: Stable mild cardiomegaly. No focal consolidation, pleural effusion, or  pneumothorax. No acute osseous pathology. IMPRESSION: No acute cardiopulmonary process. Stable mild cardiomegaly. Electronically Signed   By: Anner Crete M.D.   On: 08/23/2016 06:53   Ct Renal Stone Study  Result Date: 08/25/2016 CLINICAL DATA:  83 year old female with fever. Patient denies abdominal pain currently. Prior appendectomy. Initial encounter. EXAM: CT ABDOMEN AND PELVIS WITHOUT CONTRAST TECHNIQUE: Multidetector CT imaging of the abdomen and pelvis was performed following the standard protocol without IV contrast. COMPARISON:  No comparison CT. Abdominal sonogram 05/07/2015. 08/23/2016 chest x-ray. FINDINGS: Lower chest: Bibasilar subsegmental atelectasis with small pleural effusions greater on right. Peribronchial thickening greater left lower lobe may represent changes of bronchitis. Heart slightly enlarged. Coronary artery calcifications. Small pericardial effusion. Hepatobiliary: Fatty liver. Taking into account limitation by non contrast imaging, no worrisome mass. 3 small calcified gallstones. Limited for evaluating for gallbladder inflammation given the motion degradation. There may be a tiny amount of peri hepatic and pericholecystic fluid. If primary gallbladder abnormality were of concern, ultrasound may consider. Pancreas: Taking into account limitation by non contrast imaging, no mass or inflammation. Duodenal diverticulum pancreatic head level. Mild motion artifact. Spleen: Elongated spleen spanning over 14 Cm. Taking into account limitation by non contrast imaging, no focal mass. Adrenals/Urinary Tract: Minimal nodularity left adrenal gland may represent small adenoma. No right adrenal mass. No renal or ureteral obstructing stone or evidence hydronephrosis. Renal lesions noted, larger ones which are cysts and some too small to adequately characterize. Calcification in the kidneys may be related to tiny nonobstructing renal calculi or vascular calcifications. Mild infiltration and  perirenal fat bilaterally may be related to chronic changes. Cannot exclude result of acute inflammation in proper clinical setting. Decompressed urinary bladder with circumferential wall thickening. Stomach/Bowel: Portions of the stomach and bowel under distended. No discrete bowel inflammatory process or obvious mass identified. Duodenal diverticulum. Vascular/Lymphatic: Calcified plaque aorta without aneurysm. Calcification femoral arteries and iliac arteries. Prominent calcification and ectatic splenic artery. No adenopathy. Reproductive: 2.8 cm calcified uterine fibroid. Right ovarian 2.1 cm septated cyst. Pelvic sonogram can be obtained for further delineation  if clinically desired. Other: Mild haziness small bowel mesentery possibly chronic. No free intraperitoneal air. No bowel containing hernia. Musculoskeletal: No acute osseous abnormality. Scattered degenerative changes lumbar spine with spinal stenosis most notable L4-5. IMPRESSION: Three small calcified gallstones. Limited for evaluating for gallbladder inflammation given motion degradation. There may be a tiny amount of peri hepatic and pericholecystic fluid. If primary gallbladder abnormality were of concern, ultrasound may consider. Bibasilar subsegmental atelectasis with small pleural effusions greater on right. Peribronchial thickening greater left lower lobe may represent changes of bronchitis. No renal or ureteral obstructing stone or evidence of hydronephrosis. Mild infiltration and perirenal fat bilaterally may be related to chronic changes. Cannot exclude result of acute inflammation in proper clinical setting. Decompressed urinary bladder with circumferential wall thickening. Mild haziness small bowel mesentery, possibly chronic. 2.8 cm calcified uterine fibroid. Right ovarian 2.1 cm septated cyst. Pelvic sonogram can be obtained for further delineation if clinically desired. Heart slightly enlarged. Coronary artery calcifications. Small  pericardial effusion. Fatty liver. Elongated spleen spanning over 14 cm. Electronically Signed   By: Genia Del M.D.   On: 08/25/2016 11:39    Microbiology: Recent Results (from the past 240 hour(s))  Culture, blood (routine x 2)     Status: None (Preliminary result)   Collection Time: 08/23/16  5:44 AM  Result Value Ref Range Status   Specimen Description BLOOD BLOOD RIGHT FOREARM  Final   Special Requests   Final    BOTTLES DRAWN AEROBIC AND ANAEROBIC Blood Culture adequate volume   Culture   Final    NO GROWTH 2 DAYS Performed at [facility], [address] [place], [place] [address]    Report Status PENDING  Incomplete  Culture, blood (routine x 2)     Status: None (Preliminary result)   Collection Time: 08/23/16  5:49 AM  Result Value Ref Range Status   Specimen Description BLOOD BLOOD LEFT FOREARM  Final   Special Requests IN PEDIATRIC BOTTLE Blood Culture adequate volume  Final   Culture   Final    NO GROWTH 2 DAYS Performed at [facility], [place] [address] [place], [place] [address]    Report Status PENDING  Incomplete  Urine culture     Status: Abnormal   Collection Time: 08/23/16  6:20 AM  Result Value Ref Range Status   Specimen Description URINE, CATHETERIZED  Final   Special Requests NONE  Final   Culture >=100,000 COLONIES/mL ESCHERICHIA COLI (A)  Final   Report Status 08/26/2016 FINAL  Final   Organism ID, Bacteria ESCHERICHIA COLI (A)  Final      Susceptibility   Escherichia coli - MIC*    AMPICILLIN <=2 SENSITIVE Sensitive     CEFAZOLIN <=4 SENSITIVE Sensitive     CEFTRIAXONE <=1 SENSITIVE Sensitive     CIPROFLOXACIN <=0.25 SENSITIVE Sensitive     GENTAMICIN <=1 SENSITIVE Sensitive     IMIPENEM <=0.25 SENSITIVE Sensitive     NITROFURANTOIN <=16 SENSITIVE Sensitive     TRIMETH/SULFA <=20 SENSITIVE Sensitive     AMPICILLIN/SULBACTAM <=2 SENSITIVE Sensitive     Extended ESBL NEGATIVE Sensitive     * >=100,000 COLONIES/mL ESCHERICHIA COLI    Respiratory Panel by PCR     Status: None   Collection Time: 08/24/16 11:32 AM  Result Value Ref Range Status   Adenovirus NOT DETECTED NOT DETECTED Final   Coronavirus 229E NOT DETECTED NOT DETECTED Final   Coronavirus HKU1 NOT DETECTED NOT DETECTED Final   Coronavirus  NL63 NOT DETECTED NOT DETECTED Final   Coronavirus OC43 NOT DETECTED NOT DETECTED Final   Metapneumovirus NOT DETECTED NOT DETECTED Final   Rhinovirus / Enterovirus NOT DETECTED NOT DETECTED Final   Influenza A NOT DETECTED NOT DETECTED Final   Influenza B NOT DETECTED NOT DETECTED Final   Parainfluenza Virus 1 NOT DETECTED NOT DETECTED Final   Parainfluenza Virus 2 NOT DETECTED NOT DETECTED Final   Parainfluenza Virus 3 NOT DETECTED NOT DETECTED Final   Parainfluenza Virus 4 NOT DETECTED NOT DETECTED Final   Respiratory Syncytial Virus NOT DETECTED NOT DETECTED Final   Bordetella pertussis NOT DETECTED NOT DETECTED Final   Chlamydophila pneumoniae NOT DETECTED NOT DETECTED Final   Mycoplasma pneumoniae NOT DETECTED NOT DETECTED Final    Comment: Performed at [facility], [place] [address] [place], [place] [address]  MRSA PCR Screening     Status: None   Collection Time: 08/25/16  8:20 AM  Result Value Ref Range Status   MRSA by PCR NEGATIVE NEGATIVE Final    Comment:        The GeneXpert MRSA Assay (FDA approved for NASAL specimens only), is one component of a comprehensive MRSA colonization surveillance program. It is not intended to diagnose MRSA infection nor to guide or monitor treatment for MRSA infections.      Labs: Basic Metabolic Panel:  Recent Labs Lab 08/23/16 0543 08/24/16 0535 08/25/16 0512 08/26/16 0800  NA 135 136 133* 135  K 4.0 4.0 3.8 3.7  CL 104 107 106 108  CO2 21* 21* 18* 18*  GLUCOSE 200* 132* 169* 70  BUN 24* 19 15 18   CREATININE 1.63* 1.54* 1.56* 1.58*  CALCIUM 9.5 8.8* 8.5* 8.3*   Liver Function Tests:  Recent Labs Lab 08/23/16 0543  AST 32  ALT 18   ALKPHOS 36*  BILITOT 0.4  PROT 7.4  ALBUMIN 4.0   No results for input(s): LIPASE, AMYLASE in the last 168 hours. No results for input(s): AMMONIA in the last 168 hours. CBC:  Recent Labs Lab 08/23/16 0543 08/24/16 0535 08/25/16 0512 08/26/16 0800  WBC 4.7 3.7* 8.2 6.6  NEUTROABS  --   --  6.0  --   HGB 11.4* 10.1* 10.1* 8.8*  HCT 34.0* 30.8* 30.3* 26.9*  MCV 89.9 90.9 89.1 90.0  PLT 154 128* 129* 124*   Cardiac Enzymes: No results for input(s): CKTOTAL, CKMB, CKMBINDEX, TROPONINI in the last 168 hours. BNP: BNP (last 3 results) No results for input(s): BNP in the last 8760 hours.  ProBNP (last 3 results) No results for input(s): PROBNP in the last 8760 hours.  CBG:  Recent Labs Lab 08/25/16 2116 08/26/16 0751 08/26/16 0819 08/26/16 0908 08/26/16 1204  GLUCAP 134* 65 69 84 112*       Signed:  Cecilia Nishikawa MD, PhD  [facility] 08/26/2016, 1:23 PM

## 2016-08-26 NOTE — Progress Notes (Signed)
Date: August 26, 2016 Chart reviewed for discharge orders [facility] will do h[facility] pt, rn and sw spoke with Anderson Malta and orders faxed to [phone] Rolling walker obtained through [facility]. Daughter is aware of plan of care. Vernia Buff, [phone]

## 2016-08-26 NOTE — Progress Notes (Signed)
Occupational Therapy Treatment Patient Details Name: Jocelyn Mendez MRN: [id number] DOB: [birth year] Today's Date: 08/26/2016    History of present illness  83 y.o. female with medical history significant of hypertension, hyperlipidemia, OA, lumbago, neuropathy, DM who presents with 1 day history of confusion and found to have sepsis secondary to UTI.   OT comments  daughters will care for pt and will A with ADL activity   Follow Up Recommendations  No OT follow up    Equipment Recommendations  3 in 1 bedside commode    Recommendations for Other Services      Precautions / Restrictions Precautions Precautions: Fall Restrictions Weight Bearing Restrictions: No       Mobility Bed Mobility Overal bed mobility: Needs Assistance Bed Mobility: Supine to Sit     Supine to sit: Supervision     General bed mobility comments: increased time due to c/o feeling "bad"  Transfers Overall transfer level: Needs assistance Equipment used: None Transfers: Sit to/from Stand;Stand Pivot Transfers Sit to Stand: Supervision Stand pivot transfers: Supervision       General transfer comment: Supervision for safety assisted on/off BSC    Balance Overall balance assessment: Needs assistance Sitting-balance support: Feet supported Sitting balance-Leahy Scale: Good       Standing balance-Leahy Scale: Good Standing balance comment: required UE support for dynamic movement                           ADL either performed or assessed with clinical judgement   ADL Overall ADL's : Needs assistance/impaired                 Upper Body Dressing : Set up;Sitting   Lower Body Dressing: Moderate assistance;Sit to/from stand;Cueing for safety;Cueing for sequencing   Toilet Transfer: Minimal assistance;BSC;Cueing for sequencing;Cueing for safety   Toileting- Clothing Manipulation and Hygiene: Minimal assistance;Sit to/from stand;Cueing for safety;Cueing for sequencing          General ADL Comments: pt daugthers will A as needed     Vision Patient Visual Report: No change from baseline     Perception     Praxis      Cognition Arousal/Alertness: Awake/alert Behavior During Therapy: WFL for tasks assessed/performed Overall Cognitive Status: Within Functional Limits for tasks assessed                                                     Pertinent Vitals/ Pain       Pain Assessment: No/denies pain  Home Living                                              Frequency  Min 2X/week        Progress Toward Goals  OT Goals(current goals can now be found in the care plan section)  Progress towards OT goals: Progressing toward goals     Plan Discharge plan remains appropriate    Co-evaluation                 AM-PAC PT "6 Clicks" Daily Activity     Outcome Measure   Help from another person eating meals?: None Help from another person taking  care of personal grooming?: A Little Help from another person toileting, which includes using toliet, bedpan, or urinal?: A Little Help from another person bathing (including washing, rinsing, drying)?: A Little Help from another person to put on and taking off regular upper body clothing?: A Little Help from another person to put on and taking off regular lower body clothing?: A Little 6 Click Score: 19    End of Session Equipment Utilized During Treatment: Rolling walker  OT Visit Diagnosis: Unsteadiness on feet (R26.81);Muscle weakness (generalized) (M62.81)   Activity Tolerance Patient tolerated treatment well   Patient Left in bed;with call bell/phone within reach;with bed alarm set   Nurse Communication Mobility status        Time: 1324-1340 OT Time Calculation (min): 16 min  Charges: OT General Charges $OT Visit: 1 Procedure OT Treatments $Self Care/Home Management : 8-22 mins  [place], [place]   Payton Mccallum D 08/26/2016, 2:02 PM

## 2016-08-26 NOTE — ED Triage Notes (Signed)
Pt brought in by her daughter with c/o pt "shaking real bad and talking out of her head. It started Saturday morning." She was recently diagnosed with a UTI with e-coli at [facility] and pts daughter reports spt has not gotten any better. No hx of dementia. Pts daughter reports concern regarding patient's tongue being red and has not eaten anything red today.

## 2016-08-26 NOTE — Progress Notes (Signed)
Physical Therapy Treatment Patient Details Name: Jocelyn Mendez MRN: [id number] DOB: 02-04-1931 Today's Date: 08/26/2016    History of Present Illness  83 y.o. female with medical history significant of hypertension, hyperlipidemia, OA, lumbago, neuropathy, DM who presents with 1 day history of confusion and found to have sepsis secondary to UTI.    PT Comments    Assisted OOB to [place].  Pt having loose uncontrolled stools.  Feeling "better" but still "bad".  Assisted with amb a limited distance in hallway using her daughters 59WW Rollator.  C/O fatigue and weakness but able to functionally mobilize to return home with family support.    Follow Up Recommendations  Home health PT     Equipment Recommendations   (Rollator [phone])    Recommendations for Other Services       Precautions / Restrictions Precautions Precautions: Fall Restrictions Weight Bearing Restrictions: No    Mobility  Bed Mobility Overal bed mobility: Needs Assistance Bed Mobility: Supine to Sit     Supine to sit: Supervision     General bed mobility comments: increased time due to c/o feeling "bad"  Transfers Overall transfer level: Needs assistance Equipment used: None Transfers: Sit to/from Stand;Stand Pivot Transfers Sit to Stand: Supervision Stand pivot transfers: Supervision       General transfer comment: Supervision for safety assisted on/off BSC  Ambulation/Gait Ambulation/Gait assistance: Supervision;Min guard Ambulation Distance (Feet): 54 Feet Assistive device: 4-wheeled walker Gait Pattern/deviations: Step-through pattern Gait velocity: decreased   General Gait Details: used daughters Rollator pt amb well.  Mod c/o weakness and stated she feels "bad".     Stairs            Wheelchair Mobility    Modified Rankin (Stroke Patients Only)       Balance                                            Cognition Arousal/Alertness: Awake/alert Behavior During  Therapy: WFL for tasks assessed/performed Overall Cognitive Status: Within Functional Limits for tasks assessed                                        Exercises      General Comments        Pertinent Vitals/Pain Pain Assessment: No/denies pain    Home Living                      Prior Function            PT Goals (current goals can now be found in the care plan section) Progress towards PT goals: Progressing toward goals    Frequency    Min 3X/week      PT Plan Current plan remains appropriate    Co-evaluation              AM-PAC PT "6 Clicks" Daily Activity  Outcome Measure  Difficulty turning over in bed (including adjusting bedclothes, sheets and blankets)?: A Lot Difficulty moving from lying on back to sitting on the side of the bed? : A Lot Difficulty sitting down on and standing up from a chair with arms (e.g., wheelchair, bedside commode, etc,.)?: A Lot Help needed moving to and from a bed to chair (including a wheelchair)?: A Lot Help  needed walking in hospital room?: A Lot Help needed climbing 3-5 steps with a railing? : A Lot 6 Click Score: 12    End of Session Equipment Utilized During Treatment: Gait belt Activity Tolerance: Patient tolerated treatment well Patient left: in bed;with call bell/phone within reach;with bed alarm set;with family/visitor present   PT Visit Diagnosis: Unsteadiness on feet (R26.81);Difficulty in walking, not elsewhere classified (R26.2)     Time: 9444-6190 PT Time Calculation (min) (ACUTE ONLY): 24 min  Charges:  $Gait Training: 8-22 mins $Therapeutic Activity: 8-22 mins                    G Codes:       {Jakyrie Totherow  PTA [facility]  Acute  Rehab Pager      [phone]

## 2016-08-27 ENCOUNTER — Other Ambulatory Visit: Payer: Self-pay

## 2016-08-27 ENCOUNTER — Other Ambulatory Visit (HOSPITAL_COMMUNITY): Payer: Medicare Other

## 2016-08-27 ENCOUNTER — Observation Stay (HOSPITAL_BASED_OUTPATIENT_CLINIC_OR_DEPARTMENT_OTHER): Payer: Medicare Other

## 2016-08-27 ENCOUNTER — Encounter (HOSPITAL_COMMUNITY): Payer: Self-pay | Admitting: *Deleted

## 2016-08-27 DIAGNOSIS — E890 Postprocedural hypothyroidism: Secondary | ICD-10-CM

## 2016-08-27 DIAGNOSIS — I5031 Acute diastolic (congestive) heart failure: Secondary | ICD-10-CM | POA: Diagnosis not present

## 2016-08-27 DIAGNOSIS — J45909 Unspecified asthma, uncomplicated: Secondary | ICD-10-CM | POA: Diagnosis present

## 2016-08-27 DIAGNOSIS — R197 Diarrhea, unspecified: Secondary | ICD-10-CM | POA: Diagnosis present

## 2016-08-27 DIAGNOSIS — E1122 Type 2 diabetes mellitus with diabetic chronic kidney disease: Secondary | ICD-10-CM

## 2016-08-27 DIAGNOSIS — E872 Acidosis: Secondary | ICD-10-CM

## 2016-08-27 DIAGNOSIS — I10 Essential (primary) hypertension: Secondary | ICD-10-CM

## 2016-08-27 DIAGNOSIS — Z794 Long term (current) use of insulin: Secondary | ICD-10-CM

## 2016-08-27 DIAGNOSIS — N183 Chronic kidney disease, stage 3 (moderate): Secondary | ICD-10-CM

## 2016-08-27 DIAGNOSIS — R509 Fever, unspecified: Secondary | ICD-10-CM | POA: Insufficient documentation

## 2016-08-27 DIAGNOSIS — J45901 Unspecified asthma with (acute) exacerbation: Secondary | ICD-10-CM | POA: Diagnosis present

## 2016-08-27 LAB — ECHOCARDIOGRAM COMPLETE
Area-P 1/2: 3.28 cm2
E decel time: 229 msec
E/e' ratio: 8.03
FS: 31 % (ref 28–44)
HEIGHTINCHES: 66 in
IVS/LV PW RATIO, ED: 0.8
LA ID, A-P, ES: 36 mm
LA diam end sys: 36 mm
LA vol A4C: 41.6 ml
LA vol index: 25.1 mL/m2
LADIAMINDEX: 1.85 cm/m2
LAVOL: 48.8 mL
LV PW d: 15 mm — AB (ref 0.6–1.1)
LVEEAVG: 8.03
LVEEMED: 8.03
LVELAT: 15.7 cm/s
LVOT area: 2.54 cm2
LVOT diameter: 18 mm
MV Dec: 229
MV pk E vel: 126 m/s
MVPG: 6 mmHg
MVPKAVEL: 91.3 m/s
MVSPHT: 67 ms
RV LATERAL S' VELOCITY: 15.2 cm/s
TAPSE: 29 mm
TDI e' lateral: 15.7
TDI e' medial: 5.35
WEIGHTICAEL: 2800 [oz_av]

## 2016-08-27 LAB — GLUCOSE, CAPILLARY
GLUCOSE-CAPILLARY: 227 mg/dL — AB (ref 65–99)
GLUCOSE-CAPILLARY: 264 mg/dL — AB (ref 65–99)
GLUCOSE-CAPILLARY: 236 mg/dL — AB (ref 65–99)
Glucose-Capillary: 300 mg/dL — ABNORMAL HIGH (ref 65–99)
Glucose-Capillary: 277 mg/dL — ABNORMAL HIGH (ref 65–99)

## 2016-08-27 LAB — TSH: TSH: 1.289 u[IU]/mL (ref 0.350–4.500)

## 2016-08-27 LAB — STREP PNEUMONIAE URINARY ANTIGEN: STREP PNEUMO URINARY ANTIGEN: NEGATIVE

## 2016-08-27 LAB — BRAIN NATRIURETIC PEPTIDE: B NATRIURETIC PEPTIDE 5: 385 pg/mL — AB (ref 0.0–100.0)

## 2016-08-27 LAB — TROPONIN I: TROPONIN I: 0.11 ng/mL — AB (ref <0.03)

## 2016-08-27 MED ORDER — IPRATROPIUM-ALBUTEROL 0.5-2.5 (3) MG/3ML IN SOLN: 3.0000 mL | RESPIRATORY_TRACT | Status: DC | PRN

## 2016-08-27 MED ORDER — HEPARIN SODIUM (PORCINE) 5000 UNIT/ML IJ SOLN
5000.0000 [IU] | Freq: Three times a day (TID) | INTRAMUSCULAR | Status: DC
  Administered 2016-08-27 – 2016-08-29 (×7): 5000 [IU] via SUBCUTANEOUS
  Filled 2016-08-27 (×5): qty 1

## 2016-08-27 MED ORDER — SACCHAROMYCES BOULARDII 250 MG PO CAPS
250.0000 mg | ORAL_CAPSULE | Freq: Two times a day (BID) | ORAL | Status: DC
  Administered 2016-08-27 – 2016-08-29 (×5): 250 mg via ORAL
  Filled 2016-08-27 (×5): qty 1

## 2016-08-27 MED ORDER — ONDANSETRON HCL 4 MG PO TABS: 4.0000 mg | ORAL_TABLET | Freq: Four times a day (QID) | ORAL | Status: DC | PRN

## 2016-08-27 MED ORDER — ACETAMINOPHEN 325 MG PO TABS
650.0000 mg | ORAL_TABLET | Freq: Four times a day (QID) | ORAL | Status: DC | PRN
  Filled 2016-08-27: qty 2

## 2016-08-27 MED ORDER — ACETAMINOPHEN 650 MG RE SUPP: 650.0000 mg | Freq: Four times a day (QID) | RECTAL | Status: DC | PRN

## 2016-08-27 MED ORDER — INSULIN NPH (HUMAN) (ISOPHANE) 100 UNIT/ML ~~LOC~~ SUSP
50.0000 [IU] | Freq: Two times a day (BID) | SUBCUTANEOUS | Status: DC
  Filled 2016-08-27: qty 10

## 2016-08-27 MED ORDER — INSULIN NPH (HUMAN) (ISOPHANE) 100 UNIT/ML ~~LOC~~ SUSP
35.0000 [IU] | Freq: Two times a day (BID) | SUBCUTANEOUS | Status: DC
  Administered 2016-08-27: 35 [IU] via SUBCUTANEOUS
  Filled 2016-08-27: qty 10

## 2016-08-27 MED ORDER — METHYLPREDNISOLONE SODIUM SUCC 125 MG IJ SOLR
125.0000 mg | INTRAMUSCULAR | Status: AC
  Administered 2016-08-27: 125 mg via INTRAVENOUS
  Filled 2016-08-27: qty 2

## 2016-08-27 MED ORDER — ONDANSETRON HCL 4 MG/2ML IJ SOLN: 4.0000 mg | Freq: Four times a day (QID) | INTRAMUSCULAR | Status: DC | PRN

## 2016-08-27 MED ORDER — FLUTICASONE PROPIONATE 50 MCG/ACT NA SUSP
1.0000 | Freq: Every day | NASAL | Status: DC
  Administered 2016-08-27 – 2016-08-29 (×3): 1 via NASAL
  Filled 2016-08-27: qty 16

## 2016-08-27 MED ORDER — DEXTROSE 5 % IV SOLN
500.0000 mg | INTRAVENOUS | Status: DC
  Administered 2016-08-27 – 2016-08-29 (×3): 500 mg via INTRAVENOUS
  Filled 2016-08-27 (×3): qty 500

## 2016-08-27 MED ORDER — LEVOTHYROXINE SODIUM 175 MCG PO TABS
175.0000 ug | ORAL_TABLET | Freq: Every day | ORAL | Status: DC
  Administered 2016-08-27 – 2016-08-29 (×3): 175 ug via ORAL
  Filled 2016-08-27 (×3): qty 1

## 2016-08-27 MED ORDER — FUROSEMIDE 10 MG/ML IJ SOLN
40.0000 mg | INTRAMUSCULAR | Status: AC
  Administered 2016-08-27: 40 mg via INTRAVENOUS
  Filled 2016-08-27: qty 4

## 2016-08-27 MED ORDER — FERROUS SULFATE 325 (65 FE) MG PO TABS
325.0000 mg | ORAL_TABLET | Freq: Two times a day (BID) | ORAL | Status: DC
Start: 2016-08-27 — End: 2016-08-29
  Administered 2016-08-27 – 2016-08-29 (×5): 325 mg via ORAL
  Filled 2016-08-27 (×5): qty 1

## 2016-08-27 MED ORDER — INSULIN NPH (HUMAN) (ISOPHANE) 100 UNIT/ML ~~LOC~~ SUSP
35.0000 [IU] | Freq: Two times a day (BID) | SUBCUTANEOUS | Status: DC
  Administered 2016-08-27: 35 [IU] via SUBCUTANEOUS
  Filled 2016-08-27 (×4): qty 10

## 2016-08-27 MED ORDER — FENOFIBRATE 160 MG PO TABS
160.0000 mg | ORAL_TABLET | Freq: Every day | ORAL | Status: DC
  Administered 2016-08-27 – 2016-08-29 (×3): 160 mg via ORAL
  Filled 2016-08-27 (×3): qty 1

## 2016-08-27 MED ORDER — DEXTROSE 5 % IV SOLN
500.0000 mg | INTRAVENOUS | Status: DC
  Filled 2016-08-27: qty 500

## 2016-08-27 MED ORDER — GUAIFENESIN ER 600 MG PO TB12
600.0000 mg | ORAL_TABLET | Freq: Two times a day (BID) | ORAL | Status: DC
  Administered 2016-08-27 – 2016-08-29 (×5): 600 mg via ORAL
  Filled 2016-08-27 (×5): qty 1

## 2016-08-27 MED ORDER — AMLODIPINE BESYLATE 10 MG PO TABS
10.0000 mg | ORAL_TABLET | Freq: Every day | ORAL | Status: DC
  Administered 2016-08-27 – 2016-08-29 (×3): 10 mg via ORAL
  Filled 2016-08-27 (×3): qty 1

## 2016-08-27 MED ORDER — ASPIRIN 81 MG PO CHEW
81.0000 mg | CHEWABLE_TABLET | Freq: Every day | ORAL | Status: DC
  Administered 2016-08-27 – 2016-08-29 (×3): 81 mg via ORAL
  Filled 2016-08-27 (×3): qty 1

## 2016-08-27 MED ORDER — DEXTROSE 5 % IV SOLN
1.0000 g | INTRAVENOUS | Status: DC
  Administered 2016-08-27 – 2016-08-28 (×2): 1 g via INTRAVENOUS
  Filled 2016-08-27 (×2): qty 10

## 2016-08-27 MED ORDER — ALBUTEROL SULFATE (2.5 MG/3ML) 0.083% IN NEBU
5.0000 mg | INHALATION_SOLUTION | Freq: Once | RESPIRATORY_TRACT | Status: AC
  Administered 2016-08-27: 5 mg via RESPIRATORY_TRACT
  Filled 2016-08-27: qty 6

## 2016-08-27 MED ORDER — METHYLPREDNISOLONE SODIUM SUCC 125 MG IJ SOLR
60.0000 mg | Freq: Two times a day (BID) | INTRAMUSCULAR | Status: DC
  Administered 2016-08-27 (×2): 60 mg via INTRAVENOUS
  Filled 2016-08-27 (×2): qty 2

## 2016-08-27 MED ORDER — SODIUM CHLORIDE 0.9% FLUSH
3.0000 mL | Freq: Two times a day (BID) | INTRAVENOUS | Status: DC
  Administered 2016-08-27 – 2016-08-28 (×4): 3 mL via INTRAVENOUS

## 2016-08-27 MED ORDER — INSULIN ASPART 100 UNIT/ML ~~LOC~~ SOLN
0.0000 [IU] | Freq: Every day | SUBCUTANEOUS | Status: DC
  Administered 2016-08-27: 2 [IU] via SUBCUTANEOUS
  Administered 2016-08-28: 4 [IU] via SUBCUTANEOUS

## 2016-08-27 MED ORDER — ROSUVASTATIN CALCIUM 20 MG PO TABS
20.0000 mg | ORAL_TABLET | Freq: Every day | ORAL | Status: DC
  Administered 2016-08-27 – 2016-08-29 (×3): 20 mg via ORAL
  Filled 2016-08-27 (×3): qty 1

## 2016-08-27 MED ORDER — LATANOPROST 0.005 % OP SOLN
1.0000 [drp] | Freq: Every day | OPHTHALMIC | Status: DC
  Administered 2016-08-27 – 2016-08-28 (×2): 1 [drp] via OPHTHALMIC
  Filled 2016-08-27: qty 2.5

## 2016-08-27 MED ORDER — INSULIN ASPART 100 UNIT/ML ~~LOC~~ SOLN
0.0000 [IU] | Freq: Three times a day (TID) | SUBCUTANEOUS | Status: DC
  Administered 2016-08-27 – 2016-08-28 (×3): 8 [IU] via SUBCUTANEOUS
  Administered 2016-08-28 – 2016-08-29 (×3): 11 [IU] via SUBCUTANEOUS
  Administered 2016-08-29: 8 [IU] via SUBCUTANEOUS

## 2016-08-27 NOTE — Progress Notes (Signed)
[place] OF CARE NOTE Patient: Jocelyn Mendez VOH:[id number]   PCP: Hoyt Koch, MD DOB: 23-Mar-1930   DOA: 08/26/2016   DOS: 08/27/2016    Patient was admitted by my colleague Dr. Tamala Julian earlier on 08/27/2016. I have reviewed the H&P as well as assessment and plan and agree with the same. Important changes in the plan are listed below.  Plan of care: Principal Problem:   Fever Active Problems:   Hypothyroidism   Essential hypertension   CKD (chronic kidney disease) stage 3, GFR 30-59 ml/min   Diabetes (HCC)   Acute diastolic (congestive) heart failure (HCC)   Diarrhea   Asthma, chronic, unspecified asthma severity, with acute exacerbation  Diabetes, the patient is using 110 units of NPH at home. Had a few episodes of hypoglycemia while patient was on Lantus. Currently patient will be receiving IV Solu-Medrol every 12 hours. Based on this information I will continue the patient on home NPH, dose will be reduced and divided into 35 units twice a day. Continue sliding scale insulin.  Author: Berle Mull, MD [facility] Pager: [phone] 08/27/2016 5:19 PM   If 7PM-7AM, please contact night-coverage at [email or web address], password [facility]

## 2016-08-27 NOTE — ED Notes (Signed)
02 at 2 liters for low sat

## 2016-08-27 NOTE — ED Notes (Signed)
P[t had hhn  Some more difficulty breathing just after the treatment with sl nausea.  Settling down some now  purwick applied to  Collect urine following the iv lasix

## 2016-08-27 NOTE — Progress Notes (Signed)
  Echocardiogram 2D Echocardiogram has been performed.  Jocelyn Mendez G [name] 08/27/2016, 5:14 PM

## 2016-08-27 NOTE — ED Notes (Signed)
Pt c/o sob for several  DAYS  SHE JUST LEFT [facility] WHERE SHE WAS A PATIENT   AND WAS DISCHARGED STILL HAVING SOME DIFFICULTY ACCORDING TO THERE FAMILY

## 2016-08-27 NOTE — H&P (Addendum)
History and Physical    Jocelyn Mendez UJW:[id number] DOB: 04/06/1930 DOA: 08/26/2016  Referring MD/NP/PA: Dr. Leonides Schanz PCP: Hoyt Koch, MD  Patient coming from: Home  Chief Complaint: "Don't feel good"   HPI: Jocelyn Mendez is a 83 y.o. female with medical history significant of HTN, HLD, bronchitis, diastolic CHF last EF >56% with grade 1 dFx in 02/2013, and remote H/O of tobacco use; who presents with complaints of not feeling well and persistent fevers. Patient was just hospitalized from 7/14 - 7/17 after being found to be acutely altered diagnosed with sepsis secondary to urinary tract infection treated with broad-spectrum antibiotics due to persistent fevers. Urine cultures revealed pan sensitive E Coli. Patient was sent home with antibiotics of Augmentin.  Respiratory viral panel and blood cultures did not show any significant signs of infection. Patient had not been home long enough to take any of the antibiotics prescribed. Within a few hours of getting home the patient was noted to have acute onset of rigors, and when family checked her temperature it was found to be elevated up to 101F. Family gave her Tylenol approximately 30 minutes before arriving to the emergency department. Patient reports having a productive cough and worsening shortness of breath. Her last hospital stay patient notes that she was given a lot of IV fluids. Patient noted to sleep in her recliner at baseline. Associated symptoms include poor appetite, diarrhea with 3 BM/day, malaise, dark black stools(unchanged since patient on iron), bright red blood with wiping. He denies having any significant leg swelling, nausea, vomiting, or abdominal pain.  ED Course: On admission into the emergency department patient was found to be afebrile with respirations elevated greater than 20, and O2 saturations maintained.  Labs WBC 6.8, hemoglobin 9.2 , sodium 132, potassium 3.9, CO2 16, BUN 19, creatinine 1.6, glucose 122,  albumin 3, and lactic acid 0.97 . Urinalysis was negative for any signs of infection. Patient was given 1 dose of Rocephin, 1 L normal saline IV fluids, and blood cultures were obtained.   Review of Systems: Review of Systems  Constitutional: Positive for chills, fever and malaise/fatigue.  HENT: Negative for ear discharge and nosebleeds.   Eyes: Negative for photophobia and pain.  Respiratory: Positive for cough and sputum production.   Cardiovascular: Negative for chest pain and palpitations.  Gastrointestinal: Positive for diarrhea. Negative for abdominal pain, nausea and vomiting.  Genitourinary: Negative for frequency.  Musculoskeletal: Negative for falls.  Skin: Negative for rash.  Neurological: Positive for weakness. Negative for speech change and focal weakness.  Endo/Heme/Allergies: Bruises/bleeds easily.  Psychiatric/Behavioral: Negative for hallucinations. The patient is not nervous/anxious.   All other systems reviewed and are negative.   Past Medical History:  Diagnosis Date  . Acute bronchitis   . Anemia of other chronic disease   . Anxiety state, unspecified   . Chest pain, unspecified   . Diverticulosis of colon (without mention of hemorrhage)   . Esophageal reflux   . Gallstones   . Generalized osteoarthrosis, unspecified site   . Headache(784.0)   . Lumbago   . Neuropathy in diabetes ([place])    bilat LE's  . Osteoporosis   . Other and unspecified hyperlipidemia   . Other B-complex deficiencies   . Type II or unspecified type diabetes mellitus without mention of complication, not stated as uncontrolled   . Unspecified disorder resulting from impaired renal function   . Unspecified essential hypertension   . Unspecified hypothyroidism     Past Surgical History:  Procedure Laterality Date  . APPENDECTOMY    . THYROIDECTOMY       reports that she quit smoking about 43 years ago. Her smoking use included Cigarettes. She has a 3.00 pack-year smoking history.  She quit smokeless tobacco use about 6 years ago. Her smokeless tobacco use included Snuff. She reports that she does not drink alcohol or use drugs.  Allergies  Allergen Reactions  . Codeine     REACTION: change in mental status  . Diphenhydramine Hcl     REACTION: itching  . Quinine     REACTION: abnormal sensations in face  . Sulfonamide Derivatives     REACTION: rash    Family History  Problem Relation Age of Onset  . Diabetes Mother   . Cancer Father        unsure ? stomach  . Heart attack Brother        x 3  . Cancer Sister        unsure type  . Lung cancer Brother        smoker  . Heart attack Sister     Prior to Admission medications   Medication Sig Start Date End Date Taking? Authorizing Provider  albuterol (PROVENTIL HFA;VENTOLIN HFA) 108 (90 BASE) MCG/ACT inhaler Inhale 2 puffs into the lungs every 6 (six) hours as needed for wheezing or shortness of breath. 04/06/14   Hoyt Koch, MD  amLODipine (NORVASC) 5 MG tablet Take 2 tablets (10 mg total) by mouth daily. 08/26/16   Florencia Reasons, MD  amoxicillin-clavulanate (AUGMENTIN) 500-125 MG tablet Take 1 tablet (500 mg total) by mouth 2 (two) times daily. 08/26/16 08/30/16  Florencia Reasons, MD  aspirin 81 MG tablet Take 81 mg by mouth daily.      [provider]  Cholecalciferol (VITAMIN D3) 2000 UNITS TABS Take 1 tablet by mouth 2 (two) times daily.    [provider]  cyanocobalamin (,VITAMIN B-12,) 1000 MCG/ML injection INJECT 1ML INTO THE MUSCLE ONCE A MONTH 02/08/16   Hoyt Koch, MD  fenofibrate 160 MG tablet TAKE 1 TABLET BY MOUTH DAILY 05/06/16   Hoyt Koch, MD  ferrous sulfate 325 (65 FE) MG EC tablet Take 325 mg by mouth 2 (two) times daily.     [provider]  fluticasone (FLONASE) 50 MCG/ACT nasal spray Place 1 spray into both nostrils daily. 08/27/16   Florencia Reasons, MD  GLOBAL EASE INJECT PEN NEEDLES 32G X 4 MM MISC USE AS DIRECTED WITH THE HUMULIN PEN INSULIN (100 DAY  SUPPLY) 08/04/16   Renato Shin, MD  Insulin NPH, Human,, Isophane, (HUMULIN N) 100 UNIT/ML Kiwkpen Inject 110 Units into the skin every morning. 08/04/16   Renato Shin, MD  Insulin Pen Needle 31G X 8 MM MISC 1 each by Does not apply route. Use with insulin pen device, humulin    [provider]  Lancets (ONETOUCH ULTRASOFT) lancets Use as instructed 02/15/13   Noralee Space, MD  latanoprost (XALATAN) 0.005 % ophthalmic solution Place 1 drop into both eyes at bedtime. 08/12/16   [provider]  levothyroxine (SYNTHROID, LEVOTHROID) 175 MCG tablet TAKE 1 TABLET BY MOUTH EVERY DAY BEFORE BREAKFAST 06/29/16   Renato Shin, MD  NEEDLE, DISP, 25 G (BD DISP NEEDLES) 25G X 5/8" MISC Use as directed to administer  the b12 injection monthly 10/05/14   Hoyt Koch, MD  ONE TOUCH ULTRA TEST test strip USE TO TEST BLOOD SUGAR 4 TIMES DAILY DX: E10.22 07/21/16  Renato Shin, MD  rosuvastatin (CRESTOR) 20 MG tablet Take 1 tablet (20 mg total) by mouth daily. 04/02/16   Hoyt Koch, MD  saccharomyces boulardii (FLORASTOR) 250 MG capsule Take 1 capsule (250 mg total) by mouth 2 (two) times daily. 08/26/16   Florencia Reasons, MD    Physical Exam:  Constitutional: Elderly female who appears to be in moderate respiratory distress  Vitals:   08/26/16 1917 08/26/16 2121 08/27/16 0034 08/27/16 0036  BP:  (!) 146/48    Pulse:  81    Resp:  18    Temp:  98.9 F (37.2 C) 98.8 F (37.1 C)   TempSrc:      SpO2:  96%  90%  Weight: 79.4 kg (175 lb)     Height: 5\' 6"  (1.676 m)      Eyes: PERRL, lids and conjunctivae normal ENMT: Mucous membranes are moist. Posterior pharynx clear of any exudate or lesions. Poor dentition.  Neck: normal, supple, no masses, no thyromegaly Respiratory: Tachypneic with positive crackles and expiratory wheezes appreciated. Patient often in shorten sinuses. Cardiovascular: Regular rate and rhythm, no murmurs / rubs / gallops. +1 pitting lower extremity edema.  2+ pedal pulses. No carotid bruits.  Abdomen: no tenderness, no masses palpated. No hepatosplenomegaly. Bowel sounds positive.  Musculoskeletal: no clubbing / cyanosis. No joint deformity upper and lower extremities. Good ROM, no contractures. Normal muscle tone.  Skin: no rashes, lesions, ulcers. No induration Neurologic: CN 2-12 grossly intact. Sensation intact, DTR normal. Strength 5/5 in all 4.  Psychiatric: Normal judgment and insight. Alert and oriented x 3. Normal mood.     Labs on Admission: I have personally reviewed following labs and imaging studies  CBC:  Recent Labs Lab 08/23/16 0543 08/24/16 0535 08/25/16 0512 08/26/16 0800 08/26/16 1932  WBC 4.7 3.7* 8.2 6.6 6.8  NEUTROABS  --   --  6.0  --  5.5  HGB 11.4* 10.1* 10.1* 8.8* 9.2*  HCT 34.0* 30.8* 30.3* 26.9* 27.8*  MCV 89.9 90.9 89.1 90.0 91.1  PLT 154 128* 129* 124* 924   Basic Metabolic Panel:  Recent Labs Lab 08/23/16 0543 08/24/16 0535 08/25/16 0512 08/26/16 0800 08/26/16 1932  NA 135 136 133* 135 132*  K 4.0 4.0 3.8 3.7 3.9  CL 104 107 106 108 107  CO2 21* 21* 18* 18* 16*  GLUCOSE 200* 132* 169* 70 122*  BUN 24* 19 15 18 19   CREATININE 1.63* 1.54* 1.56* 1.58* 1.60*  CALCIUM 9.5 8.8* 8.5* 8.3* 8.6*   GFR: Estimated Creatinine Clearance: 28.3 mL/min (A) (by C-G formula based on SCr of 1.6 mg/dL (H)). Liver Function Tests:  Recent Labs Lab 08/23/16 0543 08/26/16 1932  AST 32 34  ALT 18 18  ALKPHOS 36* 32*  BILITOT 0.4 0.7  PROT 7.4 6.3*  ALBUMIN 4.0 3.0*   No results for input(s): LIPASE, AMYLASE in the last 168 hours. No results for input(s): AMMONIA in the last 168 hours. Coagulation Profile: No results for input(s): INR, PROTIME in the last 168 hours. Cardiac Enzymes: No results for input(s): CKTOTAL, CKMB, CKMBINDEX, TROPONINI in the last 168 hours. BNP (last 3 results) No results for input(s): PROBNP in the last 8760 hours. HbA1C: No results for input(s): HGBA1C in the last 72  hours. CBG:  Recent Labs Lab 08/25/16 2116 08/26/16 0751 08/26/16 0819 08/26/16 0908 08/26/16 1204  GLUCAP 134* 65 69 84 112*   Lipid Profile: No results for input(s): CHOL, HDL, LDLCALC, TRIG, CHOLHDL, LDLDIRECT in the last  72 hours. Thyroid Function Tests: No results for input(s): TSH, T4TOTAL, FREET4, T3FREE, THYROIDAB in the last 72 hours. Anemia Panel: No results for input(s): VITAMINB12, FOLATE, FERRITIN, TIBC, IRON, RETICCTPCT in the last 72 hours. Urine analysis:    Component Value Date/Time   COLORURINE YELLOW 08/26/2016 1931   APPEARANCEUR HAZY (A) 08/26/2016 1931   LABSPEC 1.018 08/26/2016 1931   PHURINE 5.0 08/26/2016 1931   GLUCOSEU NEGATIVE 08/26/2016 1931   HGBUR MODERATE (A) 08/26/2016 1931   BILIRUBINUR NEGATIVE 08/26/2016 1931   KETONESUR 5 (A) 08/26/2016 1931   PROTEINUR 100 (A) 08/26/2016 1931   UROBILINOGEN 0.2 03/02/2013 1111   NITRITE NEGATIVE 08/26/2016 1931   LEUKOCYTESUR TRACE (A) 08/26/2016 1931   Sepsis Labs: Recent Results (from the past 240 hour(s))  Culture, blood (routine x 2)     Status: None (Preliminary result)   Collection Time: 08/23/16  5:44 AM  Result Value Ref Range Status   Specimen Description BLOOD BLOOD RIGHT FOREARM  Final   Special Requests   Final    BOTTLES DRAWN AEROBIC AND ANAEROBIC Blood Culture adequate volume   Culture   Final    NO GROWTH 3 DAYS Performed at [facility], [place] [address], [place], [place] [address]    Report Status PENDING  Incomplete  Culture, blood (routine x 2)     Status: None (Preliminary result)   Collection Time: 08/23/16  5:49 AM  Result Value Ref Range Status   Specimen Description BLOOD BLOOD LEFT FOREARM  Final   Special Requests IN PEDIATRIC BOTTLE Blood Culture adequate volume  Final   Culture   Final    NO GROWTH 3 DAYS Performed at [facility], [place] [address] [place], [place] [address]    Report Status PENDING  Incomplete  Urine culture     Status: Abnormal    Collection Time: 08/23/16  6:20 AM  Result Value Ref Range Status   Specimen Description URINE, CATHETERIZED  Final   Special Requests NONE  Final   Culture >=100,000 COLONIES/mL ESCHERICHIA COLI (A)  Final   Report Status 08/26/2016 FINAL  Final   Organism ID, Bacteria ESCHERICHIA COLI (A)  Final      Susceptibility   Escherichia coli - MIC*    AMPICILLIN <=2 SENSITIVE Sensitive     CEFAZOLIN <=4 SENSITIVE Sensitive     CEFTRIAXONE <=1 SENSITIVE Sensitive     CIPROFLOXACIN <=0.25 SENSITIVE Sensitive     GENTAMICIN <=1 SENSITIVE Sensitive     IMIPENEM <=0.25 SENSITIVE Sensitive     NITROFURANTOIN <=16 SENSITIVE Sensitive     TRIMETH/SULFA <=20 SENSITIVE Sensitive     AMPICILLIN/SULBACTAM <=2 SENSITIVE Sensitive     Extended ESBL NEGATIVE Sensitive     * >=100,000 COLONIES/mL ESCHERICHIA COLI  Respiratory Panel by PCR     Status: None   Collection Time: 08/24/16 11:32 AM  Result Value Ref Range Status   Adenovirus NOT DETECTED NOT DETECTED Final   Coronavirus 229E NOT DETECTED NOT DETECTED Final   Coronavirus HKU1 NOT DETECTED NOT DETECTED Final   Coronavirus NL63 NOT DETECTED NOT DETECTED Final   Coronavirus OC43 NOT DETECTED NOT DETECTED Final   Metapneumovirus NOT DETECTED NOT DETECTED Final   Rhinovirus / Enterovirus NOT DETECTED NOT DETECTED Final   Influenza A NOT DETECTED NOT DETECTED Final   Influenza B NOT DETECTED NOT DETECTED Final   Parainfluenza Virus 1 NOT DETECTED NOT DETECTED Final   Parainfluenza Virus 2 NOT DETECTED NOT DETECTED Final  Parainfluenza Virus 3 NOT DETECTED NOT DETECTED Final   Parainfluenza Virus 4 NOT DETECTED NOT DETECTED Final   Respiratory Syncytial Virus NOT DETECTED NOT DETECTED Final   Bordetella pertussis NOT DETECTED NOT DETECTED Final   Chlamydophila pneumoniae NOT DETECTED NOT DETECTED Final   Mycoplasma pneumoniae NOT DETECTED NOT DETECTED Final    Comment: Performed at [facility], [place] [address], [place], [place] [address]    MRSA PCR Screening     Status: None   Collection Time: 08/25/16  8:20 AM  Result Value Ref Range Status   MRSA by PCR NEGATIVE NEGATIVE Final    Comment:        The GeneXpert MRSA Assay (FDA approved for NASAL specimens only), is one component of a comprehensive MRSA colonization surveillance program. It is not intended to diagnose MRSA infection nor to guide or monitor treatment for MRSA infections.      Radiological Exams on Admission: Dg Chest 2 View  Result Date: 08/26/2016 CLINICAL DATA:  Productive cough and fever EXAM: CHEST  2 VIEW COMPARISON:  08/23/2016 FINDINGS: Cardiac shadow is mildly enlarged but stable. Aortic calcifications are again seen. Small bilateral pleural effusions are noted with bibasilar atelectasis. No focal infiltrate is seen. No bony abnormality is noted. IMPRESSION: Small bilateral pleural effusions and basilar atelectasis. Electronically Signed   By: Inez Catalina M.D.   On: 08/26/2016 21:13   Ct Renal Stone Study  Result Date: 08/25/2016 CLINICAL DATA:  83 year old female with fever. Patient denies abdominal pain currently. Prior appendectomy. Initial encounter. EXAM: CT ABDOMEN AND PELVIS WITHOUT CONTRAST TECHNIQUE: Multidetector CT imaging of the abdomen and pelvis was performed following the standard protocol without IV contrast. COMPARISON:  No comparison CT. Abdominal sonogram 05/07/2015. 08/23/2016 chest x-ray. FINDINGS: Lower chest: Bibasilar subsegmental atelectasis with small pleural effusions greater on right. Peribronchial thickening greater left lower lobe may represent changes of bronchitis. Heart slightly enlarged. Coronary artery calcifications. Small pericardial effusion. Hepatobiliary: Fatty liver. Taking into account limitation by non contrast imaging, no worrisome mass. 3 small calcified gallstones. Limited for evaluating for gallbladder inflammation given the motion degradation. There may be a tiny amount of peri hepatic and pericholecystic  fluid. If primary gallbladder abnormality were of concern, ultrasound may consider. Pancreas: Taking into account limitation by non contrast imaging, no mass or inflammation. Duodenal diverticulum pancreatic head level. Mild motion artifact. Spleen: Elongated spleen spanning over 14 Cm. Taking into account limitation by non contrast imaging, no focal mass. Adrenals/Urinary Tract: Minimal nodularity left adrenal gland may represent small adenoma. No right adrenal mass. No renal or ureteral obstructing stone or evidence hydronephrosis. Renal lesions noted, larger ones which are cysts and some too small to adequately characterize. Calcification in the kidneys may be related to tiny nonobstructing renal calculi or vascular calcifications. Mild infiltration and perirenal fat bilaterally may be related to chronic changes. Cannot exclude result of acute inflammation in proper clinical setting. Decompressed urinary bladder with circumferential wall thickening. Stomach/Bowel: Portions of the stomach and bowel under distended. No discrete bowel inflammatory process or obvious mass identified. Duodenal diverticulum. Vascular/Lymphatic: Calcified plaque aorta without aneurysm. Calcification femoral arteries and iliac arteries. Prominent calcification and ectatic splenic artery. No adenopathy. Reproductive: 2.8 cm calcified uterine fibroid. Right ovarian 2.1 cm septated cyst. Pelvic sonogram can be obtained for further delineation if clinically desired. Other: Mild haziness small bowel mesentery possibly chronic. No free intraperitoneal air. No bowel containing hernia. Musculoskeletal: No acute osseous abnormality. Scattered degenerative changes lumbar spine with spinal stenosis  most notable L4-5. IMPRESSION: Three small calcified gallstones. Limited for evaluating for gallbladder inflammation given motion degradation. There may be a tiny amount of peri hepatic and pericholecystic fluid. If primary gallbladder abnormality were  of concern, ultrasound may consider. Bibasilar subsegmental atelectasis with small pleural effusions greater on right. Peribronchial thickening greater left lower lobe may represent changes of bronchitis. No renal or ureteral obstructing stone or evidence of hydronephrosis. Mild infiltration and perirenal fat bilaterally may be related to chronic changes. Cannot exclude result of acute inflammation in proper clinical setting. Decompressed urinary bladder with circumferential wall thickening. Mild haziness small bowel mesentery, possibly chronic. 2.8 cm calcified uterine fibroid. Right ovarian 2.1 cm septated cyst. Pelvic sonogram can be obtained for further delineation if clinically desired. Heart slightly enlarged. Coronary artery calcifications. Small pericardial effusion. Fatty liver. Elongated spleen spanning over 14 cm. Electronically Signed   By: Genia Del M.D.   On: 08/25/2016 11:39     EKG: Independently reviewed. Sinus rhythm  Assessment/Plan Fever/ SIRS: Unresolved. Patient presents with recurrent fevers up to 101F and tachypnea. Previously, symptoms thought to be secondary to urinary tract infection with pansensitive Escherichia coli. Respiratory panel was seen to be negative for any signs of infection. Repeat UA currently negative for signs of infection. Previous workup negative for any viral respiratory cause and blood cultures negative for last 4 days. Differential includes pyelonephritis vs pneumonia versus other.  - Admit  to a telemetry bed - Follow up blood and sputum cultures - Continue Roccephin and add on Azithromycin - patient may warrant broadening of antibiotics   Acute asthma exacerbation with bronchitis:Acute. Patient reports productive cough with wheezing. - Continuous pulse oximetry overnight with nasal cannula oxygen as needed - Solu-Medrol 125 mg IV 1 dose now, reassess and determine if continued need of IV steroids - DuoNeb prn SOB/Wheezing  - Mucinex  Suspected   diastolic CHF exacerbation : Patient with previously noted to have small pericardial effusion on CT scan of the abdomen from 7/16. On physical exam found to have +1 pitting edema of the lower extremities with positive crackles on lung examination. Chest x-ray on admission shows new bilateral pleural effusions with enlarged cardiac silhouette.  patient's last echocardiogram showed EF >70% with grade 1 dFx in 02/2013. - Check BNP and troponin - Hold IV Fluids - Give Lasix 40 mg IV x 1 dose now, reassess in a.m. for need of continued IV diuretics  - Strict ins and outs - Check echocardiogram  Diarrhea: Acute. Patient reports having a least a loose stools per day - If symptoms persist given recent history of antibiotics consider need of evaluation for C. difficile.       Essential hypertension - Continue amlodipine  Diabetes mellitus type 2: Last hemoglobin A1c 7.8 on 08/04/2016. - Hypoglycemic protocols - Continue home dose of NPH - CBGs every before meals and at bedtime - adjust insulin regimen as needed  Hypothyroidism - Check TSH - Continue levothyroxine  Anemia of chronic disease: Hemoglobin 9.2 on admission baseline hemoglobin appears to be around 10-11.  - Continue ferrous sulfate - Recheck CBC in a.m.  Chronic kidney disease stage III-IV: Stable. Patient baseline creatinine appears to be around 1.5-1.6. - Continue to monitor   Dyslipidemia - Continue Crestor and fenofibrate   DVT prophylaxis: heparin Code Status: Full Family Communication: Discussed plan of care with the patient family present at bedside  Disposition Plan:  TBD Consults called: None  Admission status: Observation Norval Morton MD [facility] Pager  [place]  If 7PM-7AM, please contact night-coverage [email or web address] Password [facility]  08/27/2016, 12:38 AM

## 2016-08-27 NOTE — Progress Notes (Addendum)
Inpatient Diabetes Program Recommendations  AACE/ADA: New Consensus Statement on Inpatient Glycemic Control (2015)  Target Ranges:  Prepandial:   less than 140 mg/dL      Peak postprandial:   less than 180 mg/dL (1-2 hours)      Critically ill patients:  140 - 180 mg/dL   Results for Jocelyn Mendez, Jocelyn Mendez (MRN [id number]) as of 08/27/2016 09:53  Ref. Range 08/26/2016 07:51 08/26/2016 08:19 08/26/2016 09:08 08/26/2016 12:04 08/27/2016 08:24  Glucose-Capillary Latest Ref Range: 65 - 99 mg/dL 65 69 84 112 (H) 300 (H)    Review of Glycemic Control  Diabetes history: DM 2 (Sees Dr. Loanne Drilling, Endocrinology) Outpatient Diabetes medications: NPH 110 units Dialy Current orders for Inpatient glycemic control: NPH 35 units BID, Novolog Moderate 0-15 units tid + Novolog HS scale 0-5 units  A1c 7.8 on 08/04/16  Inpatient Diabetes Program Recommendations:    Patient was on Lantus 25 units QHS and was controlled with the dosing until yesterday am when she had mild hypoglycemia 65 mg/dl, patient was also eating 50% or less of meals. Patient did not get basal insulin last night. Glucose 300 mg/dl this am. IV Solumedrol 125 mg given x1 dose this am.  Patient has NPH 35 BID ordered. Concern for Hypoglycemia, consider decreasing NPH dose after today based on glucose trends the last 2 days on Lantus 25 units QHS. Glucose trends should be lower if no more steroids are given.  Thanks,  Tama Headings RN, MSN, [facility] Inpatient Diabetes Coordinator Team Pager [phone] (8a-5p)

## 2016-08-27 NOTE — ED Notes (Signed)
Pt in the br for at least 15 minutes with family member  Very exertional sob  Audible wheezes when she came back from the br  Iv meds hung

## 2016-08-27 NOTE — ED Notes (Signed)
REPORT CALLED TO RN ON [id number]

## 2016-08-27 NOTE — Care Management Obs Status (Signed)
[place] NOTIFICATION   Patient Details  Name: MEILI KLECKLEY MRN: [id number] Date of Birth: Jul 27, 1930   Medicare Observation Status Notification Given:  Yes    Dawayne Patricia, RN 08/27/2016, 2:49 PM

## 2016-08-28 ENCOUNTER — Telehealth: Payer: Self-pay | Admitting: *Deleted

## 2016-08-28 ENCOUNTER — Other Ambulatory Visit (HOSPITAL_COMMUNITY): Payer: Medicare Other

## 2016-08-28 DIAGNOSIS — J9601 Acute respiratory failure with hypoxia: Secondary | ICD-10-CM | POA: Diagnosis present

## 2016-08-28 DIAGNOSIS — R197 Diarrhea, unspecified: Secondary | ICD-10-CM | POA: Diagnosis present

## 2016-08-28 DIAGNOSIS — D638 Anemia in other chronic diseases classified elsewhere: Secondary | ICD-10-CM | POA: Diagnosis present

## 2016-08-28 DIAGNOSIS — Z7982 Long term (current) use of aspirin: Secondary | ICD-10-CM | POA: Diagnosis not present

## 2016-08-28 DIAGNOSIS — Z801 Family history of malignant neoplasm of trachea, bronchus and lung: Secondary | ICD-10-CM | POA: Diagnosis not present

## 2016-08-28 DIAGNOSIS — Z8249 Family history of ischemic heart disease and other diseases of the circulatory system: Secondary | ICD-10-CM | POA: Diagnosis not present

## 2016-08-28 DIAGNOSIS — Z79899 Other long term (current) drug therapy: Secondary | ICD-10-CM | POA: Diagnosis not present

## 2016-08-28 DIAGNOSIS — Z87891 Personal history of nicotine dependence: Secondary | ICD-10-CM | POA: Diagnosis not present

## 2016-08-28 DIAGNOSIS — E1122 Type 2 diabetes mellitus with diabetic chronic kidney disease: Secondary | ICD-10-CM | POA: Diagnosis present

## 2016-08-28 DIAGNOSIS — J209 Acute bronchitis, unspecified: Secondary | ICD-10-CM | POA: Diagnosis present

## 2016-08-28 DIAGNOSIS — J45901 Unspecified asthma with (acute) exacerbation: Secondary | ICD-10-CM | POA: Diagnosis present

## 2016-08-28 DIAGNOSIS — I5033 Acute on chronic diastolic (congestive) heart failure: Secondary | ICD-10-CM | POA: Diagnosis present

## 2016-08-28 DIAGNOSIS — J440 Chronic obstructive pulmonary disease with acute lower respiratory infection: Secondary | ICD-10-CM | POA: Diagnosis present

## 2016-08-28 DIAGNOSIS — I5031 Acute diastolic (congestive) heart failure: Secondary | ICD-10-CM | POA: Diagnosis not present

## 2016-08-28 DIAGNOSIS — G9340 Encephalopathy, unspecified: Secondary | ICD-10-CM | POA: Diagnosis present

## 2016-08-28 DIAGNOSIS — J441 Chronic obstructive pulmonary disease with (acute) exacerbation: Secondary | ICD-10-CM | POA: Diagnosis present

## 2016-08-28 DIAGNOSIS — Z794 Long term (current) use of insulin: Secondary | ICD-10-CM | POA: Diagnosis not present

## 2016-08-28 DIAGNOSIS — Z833 Family history of diabetes mellitus: Secondary | ICD-10-CM | POA: Diagnosis not present

## 2016-08-28 DIAGNOSIS — E785 Hyperlipidemia, unspecified: Secondary | ICD-10-CM | POA: Diagnosis present

## 2016-08-28 DIAGNOSIS — N184 Chronic kidney disease, stage 4 (severe): Secondary | ICD-10-CM | POA: Diagnosis present

## 2016-08-28 DIAGNOSIS — M810 Age-related osteoporosis without current pathological fracture: Secondary | ICD-10-CM | POA: Diagnosis present

## 2016-08-28 DIAGNOSIS — E1140 Type 2 diabetes mellitus with diabetic neuropathy, unspecified: Secondary | ICD-10-CM | POA: Diagnosis present

## 2016-08-28 DIAGNOSIS — I130 Hypertensive heart and chronic kidney disease with heart failure and stage 1 through stage 4 chronic kidney disease, or unspecified chronic kidney disease: Secondary | ICD-10-CM | POA: Diagnosis present

## 2016-08-28 DIAGNOSIS — R0602 Shortness of breath: Secondary | ICD-10-CM | POA: Diagnosis present

## 2016-08-28 DIAGNOSIS — Z9049 Acquired absence of other specified parts of digestive tract: Secondary | ICD-10-CM | POA: Diagnosis not present

## 2016-08-28 DIAGNOSIS — Z7951 Long term (current) use of inhaled steroids: Secondary | ICD-10-CM | POA: Diagnosis not present

## 2016-08-28 LAB — CBC
HEMATOCRIT: 31.2 % — AB (ref 36.0–46.0)
Hemoglobin: 10.1 g/dL — ABNORMAL LOW (ref 12.0–15.0)
MCH: 29.4 pg (ref 26.0–34.0)
MCHC: 32.4 g/dL (ref 30.0–36.0)
MCV: 90.7 fL (ref 78.0–100.0)
PLATELETS: 238 10*3/uL (ref 150–400)
RBC: 3.44 MIL/uL — AB (ref 3.87–5.11)
RDW: 13.2 % (ref 11.5–15.5)
WBC: 5.6 10*3/uL (ref 4.0–10.5)

## 2016-08-28 LAB — BASIC METABOLIC PANEL
ANION GAP: 11 (ref 5–15)
BUN: 29 mg/dL — ABNORMAL HIGH (ref 6–20)
CALCIUM: 9.3 mg/dL (ref 8.9–10.3)
CO2: 19 mmol/L — ABNORMAL LOW (ref 22–32)
Chloride: 107 mmol/L (ref 101–111)
Creatinine, Ser: 1.56 mg/dL — ABNORMAL HIGH (ref 0.44–1.00)
GFR, EST AFRICAN AMERICAN: 34 mL/min — AB (ref >60)
GFR, EST NON AFRICAN AMERICAN: 30 mL/min — AB (ref >60)
Glucose, Bld: 312 mg/dL — ABNORMAL HIGH (ref 65–99)
POTASSIUM: 4.5 mmol/L (ref 3.5–5.1)
Sodium: 137 mmol/L (ref 135–145)

## 2016-08-28 LAB — CULTURE, BLOOD (ROUTINE X 2)
CULTURE: NO GROWTH
CULTURE: NO GROWTH
SPECIAL REQUESTS: ADEQUATE
Special Requests: ADEQUATE

## 2016-08-28 LAB — GLUCOSE, CAPILLARY
GLUCOSE-CAPILLARY: 332 mg/dL — AB (ref 65–99)
GLUCOSE-CAPILLARY: 283 mg/dL — AB (ref 65–99)
GLUCOSE-CAPILLARY: 308 mg/dL — AB (ref 65–99)
Glucose-Capillary: 311 mg/dL — ABNORMAL HIGH (ref 65–99)

## 2016-08-28 LAB — MAGNESIUM: Magnesium: 2 mg/dL (ref 1.7–2.4)

## 2016-08-28 MED ORDER — INSULIN NPH (HUMAN) (ISOPHANE) 100 UNIT/ML ~~LOC~~ SUSP: 40.0000 [IU] | Freq: Two times a day (BID) | SUBCUTANEOUS | Status: DC

## 2016-08-28 MED ORDER — FUROSEMIDE 10 MG/ML IJ SOLN
20.0000 mg | Freq: Once | INTRAMUSCULAR | Status: AC
  Administered 2016-08-28: 20 mg via INTRAVENOUS
  Filled 2016-08-28: qty 2

## 2016-08-28 MED ORDER — INSULIN NPH (HUMAN) (ISOPHANE) 100 UNIT/ML ~~LOC~~ SUSP
40.0000 [IU] | Freq: Two times a day (BID) | SUBCUTANEOUS | Status: DC
  Administered 2016-08-28 – 2016-08-29 (×3): 40 [IU] via SUBCUTANEOUS

## 2016-08-28 MED ORDER — METHYLPREDNISOLONE SODIUM SUCC 40 MG IJ SOLR
40.0000 mg | Freq: Two times a day (BID) | INTRAMUSCULAR | Status: DC
  Administered 2016-08-28 (×2): 40 mg via INTRAVENOUS
  Filled 2016-08-28 (×2): qty 1

## 2016-08-28 NOTE — Progress Notes (Signed)
[facility] Progress Note  Patient: Jocelyn Mendez ITG:[id number]   PCP: Hoyt Koch, MD DOB: [birth year]   DOA: 08/26/2016   DOS: 08/28/2016   Date of Service: the patient was seen and examined on 08/28/2016  Subjective: Continues to have shortness of breath. Orthopnea. Tachypnea. Cough is getting better. No chest pain. Also has swelling in the leg.  Brief hospital course: Pt. with PMH of hypertension, dyslipidemia, chronic bronchitis, chronic CHF, former smoker; admitted on 08/26/2016, presented with complaint of shortness of breath and fever, was found to have acute hypoxic respiratory failure with acute on chronic diastolic dysfunction and acute on chronic bronchitis. Currently further plan is continue IV antibiotics, IV steroids and give IV Lasix.  Assessment and Plan: 1. Acute hypoxic respiratory failure. Acute on chronic diastolic dysfunction. Echo shows preserved EF with diastolic dysfunction. Patient responded to IV Lasix, so far -2.5 L. Given her history of chronic kidney disease, as well as recent admission with sepsis, diuresing gently. We'll give 20 mg of IV Lasix and monitor.  2. Chronic bronchitis. Likely acute bronchitis versus bronchiolitis. Patient presented with severe shortness of breath as well as fever. No fever or leukocytosis here but had significant expiratory wheezing. Blood culture so far negative, respiratory virus and panel negative. Patient responded well to IV antibiotics as well as IV steroids. We'll continue them for now. Monitor.  Diarrhea: Acute. Presented on admission, currently resolved. Monitor.     Essential hypertension - Continue amlodipine  Diabetes mellitus type 2: Last hemoglobin A1c 7.8 on 08/04/2016. - Hypoglycemic protocols - Changing home dose of NPH to twice a day. - CBGs every before meals and at bedtime - adjust insulin regimen as needed  Hypothyroidism - Continue levothyroxine  Anemia of chronic disease:  Hemoglobin 9.2 on admission baseline hemoglobin appears to be around 10-11.  - Continue ferrous sulfate - Recheck CBC in a.m.  Chronic kidney disease stage III-IV: Stable. Patient baseline creatinine appears to be around 1.5-1.6. - Continue to monitor   Dyslipidemia - Continue Crestor and fenofibrate  Diet: carb modified diet DVT Prophylaxis: subcutaneous Heparin  Advance goals of care discussion: partial  Family Communication: family was present at bedside, at the time of interview. The pt provided permission to discuss medical plan with the family. Opportunity was given to ask question and all questions were answered satisfactorily.   Disposition:  Discharge to home.  Consultants: noen Procedures: none  Antibiotics: Anti-infectives    Start     Dose/Rate Route Frequency Ordered Stop   08/27/16 2200  cefTRIAXone (ROCEPHIN) 1 g in dextrose 5 % 50 mL IVPB     1 g 100 mL/hr over 30 Minutes Intravenous Every 24 hours 08/27/16 0154     08/27/16 0600  azithromycin (ZITHROMAX) 500 mg in dextrose 5 % 250 mL IVPB     500 mg 250 mL/hr over 60 Minutes Intravenous Every 24 hours 08/27/16 0542 09/03/16 0559   08/27/16 0200  azithromycin (ZITHROMAX) 500 mg in dextrose 5 % 250 mL IVPB  Status:  Discontinued     500 mg 250 mL/hr over 60 Minutes Intravenous Every 24 hours 08/27/16 0154 08/27/16 0542   08/26/16 2330  cefTRIAXone (ROCEPHIN) 1 g in dextrose 5 % 50 mL IVPB     1 g 100 mL/hr over 30 Minutes Intravenous  Once 08/26/16 2323 08/27/16 0145       Objective: Physical Exam: Vitals:   08/27/16 2156 08/28/16 0534 08/28/16 0920 08/28/16 1645  BP: (!) 160/51 (!) 173/68 Marland Kitchen)  143/52 (!) 146/51  Pulse: 94 85 80 86  Resp: 18 18 16 19   Temp: 97.8 F (36.6 C) 98 F (36.7 C) 97.6 F (36.4 C) 98.1 F (36.7 C)  TempSrc: Oral Oral Oral Oral  SpO2: 99% 100% 97% 94%  Weight:      Height:        Intake/Output Summary (Last 24 hours) at 08/28/16 1808 Last data filed at 08/28/16 1446   Gross per 24 hour  Intake              420 ml  Output             1050 ml  Net             -630 ml   Filed Weights   08/26/16 1917  Weight: 79.4 kg (175 lb)   General: Alert, Awake and Oriented to Time, Place and Person. Appear in moderate distress, affect appropriate Eyes: PERRL, Conjunctiva normal ENT: Oral Mucosa clear moist. Neck: no JVD, no Abnormal Mass Or lumps Cardiovascular: S1 and S2 Present, no Murmur, Peripheral Pulses Present Respiratory: increased respiratory effort, Bilateral Air entry equal and Decreased, no use of accessory muscle, bilateral basal Crackles, expiratory wheezes Abdomen: Bowel Sound present, Soft and no tenderness, no hernia Skin: no redness, no Rash, no induration Extremities: bilateral Pedal edema, no calf tenderness Neurologic: Grossly no focal neuro deficit. Bilaterally Equal motor strength  Data Reviewed: CBC:  Recent Labs Lab 08/24/16 0535 08/25/16 0512 08/26/16 0800 08/26/16 1932 08/28/16 0526  WBC 3.7* 8.2 6.6 6.8 5.6  NEUTROABS  --  6.0  --  5.5  --   HGB 10.1* 10.1* 8.8* 9.2* 10.1*  HCT 30.8* 30.3* 26.9* 27.8* 31.2*  MCV 90.9 89.1 90.0 91.1 90.7  PLT 128* 129* 124* 150 401   Basic Metabolic Panel:  Recent Labs Lab 08/24/16 0535 08/25/16 0512 08/26/16 0800 08/26/16 1932 08/28/16 0526  NA 136 133* 135 132* 137  K 4.0 3.8 3.7 3.9 4.5  CL 107 106 108 107 107  CO2 21* 18* 18* 16* 19*  GLUCOSE 132* 169* 70 122* 312*  BUN 19 15 18 19  29*  CREATININE 1.54* 1.56* 1.58* 1.60* 1.56*  CALCIUM 8.8* 8.5* 8.3* 8.6* 9.3  MG  --   --   --   --  2.0    Liver Function Tests:  Recent Labs Lab 08/23/16 0543 08/26/16 1932  AST 32 34  ALT 18 18  ALKPHOS 36* 32*  BILITOT 0.4 0.7  PROT 7.4 6.3*  ALBUMIN 4.0 3.0*   No results for input(s): LIPASE, AMYLASE in the last 168 hours. No results for input(s): AMMONIA in the last 168 hours. Coagulation Profile: No results for input(s): INR, PROTIME in the last 168 hours. Cardiac  Enzymes:  Recent Labs Lab 08/27/16 0210  TROPONINI 0.11*   BNP (last 3 results) No results for input(s): PROBNP in the last 8760 hours. CBG:  Recent Labs Lab 08/27/16 1703 08/27/16 2122 08/28/16 0759 08/28/16 1154 08/28/16 1722  GLUCAP 264* 236* 332* 283* 308*   Studies: No results found.  Scheduled Meds: . amLODipine  10 mg Oral Daily  . aspirin  81 mg Oral Daily  . fenofibrate  160 mg Oral Daily  . ferrous sulfate  325 mg Oral BID  . fluticasone  1 spray Each Nare Daily  . guaiFENesin  600 mg Oral BID  . heparin  5,000 Units Subcutaneous Q8H  . insulin aspart  0-15 Units Subcutaneous TID WC  .  insulin aspart  0-5 Units Subcutaneous QHS  . insulin NPH Human  40 Units Subcutaneous BID AC & HS  . latanoprost  1 drop Both Eyes QHS  . levothyroxine  175 mcg Oral QAC breakfast  . methylPREDNISolone (SOLU-MEDROL) injection  40 mg Intravenous Q12H  . rosuvastatin  20 mg Oral Daily  . saccharomyces boulardii  250 mg Oral BID  . sodium chloride flush  3 mL Intravenous Q12H   Continuous Infusions: . azithromycin 500 mg (08/28/16 0545)  . cefTRIAXone (ROCEPHIN)  IV Stopped (08/27/16 2146)   PRN Meds: acetaminophen **OR** acetaminophen, ipratropium-albuterol, ondansetron **OR** ondansetron (ZOFRAN) IV  Time spent: 35 minutes  Author: Berle Mull, MD [facility] Pager: [phone] 08/28/2016 6:08 PM  If 7PM-7AM, please contact night-coverage at [email or web address], password [facility]

## 2016-08-28 NOTE — Progress Notes (Signed)
PT Cancellation Note  Patient Details Name: Jocelyn Mendez MRN: [id number] DOB: Jul 12, 1930   Cancelled Treatment:    Reason Eval/Treat Not Completed: Other (comment).  Pt refused as she has company and PT shared that we would be back later to try again.   Ramond Dial 08/28/2016, 10:27 AM   Mee Hives, PT MS Acute Rehab Dept. Number: [place] [facility] [place]

## 2016-08-28 NOTE — Care Management Obs Status (Signed)
[facility] NOTIFICATION   Patient Details  Name: DALIYA PARCHMENT MRN: [id number] Date of Birth: 07/08/1930   Medicare Observation Status Notification Given:  Yes    Domnique Vantine, Rory Percy, RN 08/28/2016, 2:18 PM

## 2016-08-28 NOTE — Evaluation (Signed)
Physical Therapy Evaluation Patient Details Name: Jocelyn Mendez MRN: [id number] DOB: 1930-06-28 Today's Date: 08/28/2016   History of Present Illness   83 y.o. female with  onset UTI, asthma and SIRS was admitted.  PMHx:  HTN, HLD, OA, lumbar pain, PN, DM, bronchitis, CHF  Clinical Impression  Pt is up to walk with PT and noted her O2 sats on room air to be:  91% supine, 90% sitting, 89% with gait and 93% after short recovery.  Pt has a large number of family members who are asking a great deal of questions, which PT was not completely able to answer as they are inquiring about length of stay and what the medical decisions about her care are.  PT referred to nsg and MD, and will continue to follow acutely for gait training and monitoring of sats and pulse, as pt elevated to 114 pulse with sat at 89% during gait.  HHPT to follow her to monitor her response to exercise and progress her mobility.    Follow Up Recommendations Home health PT    Equipment Recommendations  None recommended by PT    Recommendations for Other Services       Precautions / Restrictions Precautions Precautions: Fall (telemetry) Restrictions Weight Bearing Restrictions: No      Mobility  Bed Mobility Overal bed mobility: Needs Assistance Bed Mobility: Supine to Sit;Sit to Supine     Supine to sit: Supervision Sit to supine: Supervision      Transfers Overall transfer level: Needs assistance Equipment used: Rolling walker (2 wheeled);1 person hand held assist Transfers: Sit to/from Stand Sit to Stand: Min guard            Ambulation/Gait Ambulation/Gait assistance: Min guard Ambulation Distance (Feet): 110 Feet Assistive device: Rolling walker (2 wheeled) Gait Pattern/deviations: Step-through pattern;Decreased stride length;Narrow base of support;Trunk flexed Gait velocity: decreased Gait velocity interpretation: Below normal speed for age/gender General Gait Details: pt is using RW and  maintaining balance but noted O2 sats dropped slightly on room air  Stairs            Wheelchair Mobility    Modified Rankin (Stroke Patients Only)       Balance Overall balance assessment: Needs assistance   Sitting balance-Leahy Scale: Good     Standing balance support: Bilateral upper extremity supported Standing balance-Leahy Scale: Fair Standing balance comment: supported her to [facility] due to her O2 being 91% at rest on room air                             Pertinent Vitals/Pain Pain Assessment: No/denies pain    Home Living Family/patient expects to be discharged to:: Private residence Living Arrangements: Children Available Help at Discharge: Family;Available 24 hours/day Type of Home: Mobile home Home Access: Stairs to enter Entrance Stairs-Rails: Right;Left;Can reach both Entrance Stairs-Number of Steps: 2 Home Layout: One level Home Equipment: Walker - 2 wheels      Prior Function Level of Independence: Independent with assistive device(s)         Comments: per family was doing well with no AD recently     Hand Dominance   Dominant Hand: Right    Extremity/Trunk Assessment   Upper Extremity Assessment Upper Extremity Assessment: Overall WFL for tasks assessed    Lower Extremity Assessment Lower Extremity Assessment: Overall WFL for tasks assessed    Cervical / Trunk Assessment Cervical / Trunk Assessment: Kyphotic  Communication  Communication: No difficulties  Cognition Arousal/Alertness: Awake/alert Behavior During Therapy: WFL for tasks assessed/performed Overall Cognitive Status: Within Functional Limits for tasks assessed                                        General Comments General comments (skin integrity, edema, etc.): Pt is dropping from 91% to 89% sat on room air to walk but at rest returned to 93%    Exercises     Assessment/Plan    PT Assessment Patient needs continued PT services  PT  Problem List Decreased activity tolerance;Decreased mobility;Cardiopulmonary status limiting activity       PT Treatment Interventions DME instruction;Gait training;Stair training;Functional mobility training;Therapeutic activities;Therapeutic exercise;Patient/family education    PT Goals (Current goals can be found in the Care Plan section)  Acute Rehab PT Goals Patient Stated Goal: go home PT Goal Formulation: With patient/family Time For Goal Achievement: 09/04/16 Potential to Achieve Goals: Good    Frequency Min 3X/week   Barriers to discharge Other (comment) (has family support for home)      Co-evaluation               AM-PAC PT "6 Clicks" Daily Activity  Outcome Measure Difficulty turning over in bed (including adjusting bedclothes, sheets and blankets)?: A Little Difficulty moving from lying on back to sitting on the side of the bed? : A Little Difficulty sitting down on and standing up from a chair with arms (e.g., wheelchair, bedside commode, etc,.)?: A Little Help needed moving to and from a bed to chair (including a wheelchair)?: A Little Help needed walking in hospital room?: A Little Help needed climbing 3-5 steps with a railing? : A Little 6 Click Score: 18    End of Session Equipment Utilized During Treatment: Gait belt Activity Tolerance: Patient tolerated treatment well Patient left: in bed;with call bell/phone within reach;with bed alarm set;with family/visitor present Nurse Communication: Mobility status PT Visit Diagnosis: Unsteadiness on feet (R26.81);Difficulty in walking, not elsewhere classified (R26.2)    Time: 4196-2229 PT Time Calculation (min) (ACUTE ONLY): 35 min   Charges:   PT Evaluation $PT Eval Moderate Complexity: 1 Procedure PT Treatments $Gait Training: 8-22 mins   PT G Codes:   PT G-Codes **NOT FOR INPATIENT CLASS** Functional Assessment Tool Used: AM-PAC 6 Clicks Basic Mobility;Clinical judgement Functional Limitation:  Mobility: Walking and moving around Mobility: Walking and Moving Around Current Status ([id number]): At least 20 percent but less than 40 percent impaired, limited or restricted Mobility: Walking and Moving Around Goal Status [phone]): At least 1 percent but less than 20 percent impaired, limited or restricted    Ramond Dial 08/28/2016, 1:26 PM   Mee Hives, PT MS Acute Rehab Dept. Number: [facility] [place]

## 2016-08-28 NOTE — Telephone Encounter (Signed)
Called pt/daughter to set up hosp f/u appt. Per daughter Jocelyn Mendez) that mom has been addicted back to the hospital. Mom started running high fever Tues night and she was admitted to [facility]...[name]

## 2016-08-29 LAB — CBC
HCT: 28.4 % — ABNORMAL LOW (ref 36.0–46.0)
Hemoglobin: 9.5 g/dL — ABNORMAL LOW (ref 12.0–15.0)
MCH: 30 pg (ref 26.0–34.0)
MCHC: 33.5 g/dL (ref 30.0–36.0)
MCV: 89.6 fL (ref 78.0–100.0)
PLATELETS: 303 10*3/uL (ref 150–400)
RBC: 3.17 MIL/uL — ABNORMAL LOW (ref 3.87–5.11)
RDW: 13.2 % (ref 11.5–15.5)
WBC: 6.5 10*3/uL (ref 4.0–10.5)

## 2016-08-29 LAB — BASIC METABOLIC PANEL
ANION GAP: 8 (ref 5–15)
BUN: 36 mg/dL — AB (ref 6–20)
CO2: 23 mmol/L (ref 22–32)
CREATININE: 1.5 mg/dL — AB (ref 0.44–1.00)
Calcium: 9.5 mg/dL (ref 8.9–10.3)
Chloride: 108 mmol/L (ref 101–111)
GFR calc Af Amer: 36 mL/min — ABNORMAL LOW (ref >60)
GFR, EST NON AFRICAN AMERICAN: 31 mL/min — AB (ref >60)
GLUCOSE: 236 mg/dL — AB (ref 65–99)
Potassium: 4 mmol/L (ref 3.5–5.1)
Sodium: 139 mmol/L (ref 135–145)

## 2016-08-29 LAB — GLUCOSE, CAPILLARY
GLUCOSE-CAPILLARY: 310 mg/dL — AB (ref 65–99)
Glucose-Capillary: 262 mg/dL — ABNORMAL HIGH (ref 65–99)

## 2016-08-29 LAB — LEGIONELLA PNEUMOPHILA SEROGP 1 UR AG: L. pneumophila Serogp 1 Ur Ag: NEGATIVE

## 2016-08-29 MED ORDER — PREDNISONE 20 MG PO TABS
40.0000 mg | ORAL_TABLET | Freq: Every day | ORAL | Status: DC
  Administered 2016-08-29: 40 mg via ORAL
  Filled 2016-08-29: qty 2

## 2016-08-29 MED ORDER — GUAIFENESIN ER 600 MG PO TB12: 600.0000 mg | ORAL_TABLET | Freq: Two times a day (BID) | ORAL | 0 refills | Status: DC

## 2016-08-29 MED ORDER — FUROSEMIDE 20 MG PO TABS: 20.0000 mg | ORAL_TABLET | Freq: Every day | ORAL | 0 refills | Status: DC | PRN

## 2016-08-29 MED ORDER — SENNOSIDES-DOCUSATE SODIUM 8.6-50 MG PO TABS
1.0000 | ORAL_TABLET | Freq: Two times a day (BID) | ORAL | Status: DC
  Administered 2016-08-29: 1 via ORAL
  Filled 2016-08-29: qty 1

## 2016-08-29 MED ORDER — POLYETHYLENE GLYCOL 3350 17 G PO PACK: 17.0000 g | PACK | Freq: Every day | ORAL | 0 refills | Status: DC

## 2016-08-29 MED ORDER — POLYETHYLENE GLYCOL 3350 17 G PO PACK
17.0000 g | PACK | Freq: Every day | ORAL | Status: DC
  Administered 2016-08-29: 17 g via ORAL
  Filled 2016-08-29: qty 1

## 2016-08-29 MED ORDER — PREDNISONE 10 MG PO TABS: ORAL_TABLET | ORAL | 0 refills | Status: DC

## 2016-08-29 MED ORDER — INSULIN ISOPHANE HUMAN 100 UNIT/ML KWIKPEN: 50.0000 [IU] | PEN_INJECTOR | Freq: Two times a day (BID) | SUBCUTANEOUS | 0 refills | Status: DC

## 2016-08-29 NOTE — Progress Notes (Signed)
Patient ambulated >50 feet O2 sat 90-94% on room air.  Oxygen level did drop to 88% for a few seconds and then came back up to 90%.

## 2016-08-29 NOTE — Care Management Note (Signed)
Case Management Note  Patient Details  Name: Jocelyn Mendez MRN: [id number] Date of Birth: 02/17/1930  Subjective/Objective:          CM following for progression and d/c planning.           Action/Plan: 08/29/2016 Met with pt re HHRN and HHPT. [facility] selected for these services, this pt lives in [place], [place]. Per pt daughter this pt has a walker, no other needs identified. [facility] notified of [place] needs.   Expected Discharge Date:  08/29/16               Expected Discharge Plan:  [place]  In-House Referral:  NA  Discharge planning Services  CM Consult  Post Acute Care Choice:  Home Health Choice offered to:  Patient  DME Arranged:  N/A DME Agency:  NA  HH Arranged:  RN, PT [place] Agency:  [facility]  Status of Service:  Completed, signed off  If discussed at [place] of Stay Meetings, dates discussed:    Additional Comments:  Adron Bene, RN 08/29/2016, 11:53 AM

## 2016-08-29 NOTE — Progress Notes (Signed)
Discharge instructions and medications discussed with patient and daughter.  All questions answered.

## 2016-08-30 LAB — LEGIONELLA PNEUMOPHILA SEROGP 1 UR AG: L. PNEUMOPHILA SEROGP 1 UR AG: NEGATIVE

## 2016-09-01 LAB — CULTURE, BLOOD (ROUTINE X 2)
CULTURE: NO GROWTH
Culture: NO GROWTH
Special Requests: ADEQUATE
Special Requests: ADEQUATE

## 2016-09-02 NOTE — Discharge Summary (Signed)
[facility] Discharge Summary   Patient: Jocelyn Mendez JJO:[id number]   PCP: Hoyt Koch, MD DOB: 12-01-1930   Date of admission: 08/26/2016   Date of discharge: 08/29/2016    Discharge Diagnoses:  Principal Problem:   Acute diastolic (congestive) heart failure (HCC) Active Problems:   Hypothyroidism   Hyperlipidemia   Essential hypertension   CKD (chronic kidney disease) stage 3, GFR 30-59 ml/min   Acute respiratory failure with hypoxia (HCC)   Diabetes (HCC)   Acute encephalopathy   Diarrhea   Asthma, chronic, unspecified asthma severity, with acute exacerbation   Acute bronchitis   Acute hypoxemic respiratory failure ([place])   Admitted From: home Disposition:  Home with home healt  Recommendations for Outpatient Follow-up:  1. Follow-up with PCP in one week   Follow-up Information    Hoyt Koch, MD. Schedule an appointment as soon as possible for a visit in 1 week.   Specialty:  Internal Medicine Why:  Appointment date on 09/03/2016 at 11:04a  Contact information: [address] [place] [address] [phone]          Diet recommendation: Cardiac diet  Activity: The patient is advised to gradually reintroduce usual activities.  Discharge Condition: good  Code Status: Partial code  History of present illness: As per the H and P dictated on admission, "Jocelyn Mendez is a 83 y.o. female with medical history significant of HTN, HLD, bronchitis, diastolic CHF last EF >27% with grade 1 dFx in 02/2013, and remote H/O of tobacco use; who presents with complaints of not feeling well and persistent fevers. Patient was just hospitalized from 7/14 - 7/17 after being found to be acutely altered diagnosed with sepsis secondary to urinary tract infection treated with broad-spectrum antibiotics due to persistent fevers. Urine cultures revealed pan sensitive E Coli. Patient was sent home with antibiotics of Augmentin.  Respiratory viral panel and  blood cultures did not show any significant signs of infection. Patient had not been home long enough to take any of the antibiotics prescribed. Within a few hours of getting home the patient was noted to have acute onset of rigors, and when family checked her temperature it was found to be elevated up to 101F. Family gave her Tylenol approximately 30 minutes before arriving to the emergency department. Patient reports having a productive cough and worsening shortness of breath. Her last hospital stay patient notes that she was given a lot of IV fluids. Patient noted to sleep in her recliner at baseline. Associated symptoms include poor appetite, diarrhea with 3 BM/day, malaise, dark black stools(unchanged since patient on iron), bright red blood with wiping. He denies having any significant leg swelling, nausea, vomiting, or abdominal pain."  Hospital Course:  Summary of her active problems in the hospital is as following. 1. Acute hypoxic respiratory failure. Acute on chronic diastolic dysfunction. Echo shows preserved EF with diastolic dysfunction. Patient responded to IV Lasix, so far -2.5 L. Given her history of chronic kidney disease, as well as recent admission with sepsis, diuresing gently.  Discharging on when necessary Lasix at present.  2. Acute on Chronic bronchitis. COPD exacerbation Patient presented with severe shortness of breath as well as fever. No fever or leukocytosis here but had significant expiratory wheezing. Blood culture so far negative, respiratory virus and panel negative. Patient responded well to IV antibiotics as well as IV steroids. Changed to by mouth steroids. Continue by mouth Augmentin on discharge.  Diarrhea:Acute. Presented on admission, currently resolved. Monitor.  Essential hypertension - Continue amlodipine  Diabetes mellitus type 2: Last hemoglobin A1c 7.8 on 08/04/2016. - Hypoglycemic protocols - Changing home dose of NPH to twice a day. -  CBGs well-controlled.  Hypothyroidism - Continue levothyroxine  Anemia of chronic disease: Hemoglobin 9.2 on admission baseline hemoglobin appears to be around 10-11.  - Continue ferrous sulfate - Recheck CBC in a.m.  Chronic kidney disease stage III-IV:Stable.Patient baseline creatinine appears to be around 1.5-1.6. - Continue to monitor   Dyslipidemia - Continue Crestor and fenofibrate  All other chronic medical condition were stable during the hospitalization.  Patient was seen by physical therapy, who recommended home health, which was arranged by Education officer, museum and case Freight forwarder. On the day of the discharge the patient's vitals were stable, and no other acute medical condition were reported by patient. the patient was felt safe to be discharge at home with home health.  Procedures and Results:  Echocardiogram  Study Conclusions  - Left ventricle: The cavity size was normal. There was moderate   concentric hypertrophy. Systolic function was vigorous. The   estimated ejection fraction was in the range of 65% to 70%. Wall   motion was normal; there were no regional wall motion   abnormalities. - Aortic valve: Trileaflet; normal thickness, mildly calcified   leaflets. There was trivial regurgitation. - Pulmonary arteries: Systolic pressure could not be accurately   estimated.  Consultations:  none  DISCHARGE MEDICATION: Discharge Medication List as of 08/29/2016 11:55 AM    START taking these medications   Details  furosemide (LASIX) 20 MG tablet Take 1 tablet (20 mg total) by mouth daily as needed for fluid or edema (weight gain of more than 3Lbs in 1 day, or 5 Lbs in 2 days.)., Starting Fri 08/29/2016, Normal    guaiFENesin (MUCINEX) 600 MG 12 hr tablet Take 1 tablet (600 mg total) by mouth 2 (two) times daily., Starting Fri 08/29/2016, Normal    polyethylene glycol (MIRALAX / GLYCOLAX) packet Take 17 g by mouth daily., Starting Sat 08/30/2016, Normal      predniSONE (DELTASONE) 10 MG tablet Take 40mg  daily for 3days,Take 30mg  daily for 3days,Take 20mg  daily for 3days,Take 10mg  daily for 3days, then stop, Normal      CONTINUE these medications which have CHANGED   Details  Insulin NPH, Human,, Isophane, (HUMULIN N) 100 UNIT/ML Kiwkpen Inject 50 Units into the skin 2 (two) times daily before a meal., Starting Fri 08/29/2016, Normal      CONTINUE these medications which have NOT CHANGED   Details  albuterol (PROVENTIL HFA;VENTOLIN HFA) 108 (90 BASE) MCG/ACT inhaler Inhale 2 puffs into the lungs every 6 (six) hours as needed for wheezing or shortness of breath., Starting Thu 04/06/2014, Print    amLODipine (NORVASC) 5 MG tablet Take 2 tablets (10 mg total) by mouth daily., Starting Tue 08/26/2016, Normal    amoxicillin-clavulanate (AUGMENTIN) 500-125 MG tablet Take 1 tablet (500 mg total) by mouth 2 (two) times daily., Starting Tue 08/26/2016, Until Sat 08/30/2016, Normal    aspirin 81 MG tablet Take 81 mg by mouth daily.  , Historical Med    Cholecalciferol (VITAMIN D3) 2000 UNITS TABS Take 2,000 Units by mouth 2 (two) times daily. , Historical Med    cyanocobalamin (,VITAMIN B-12,) 1000 MCG/ML injection INJECT 1ML INTO THE MUSCLE ONCE A MONTH, Normal    fenofibrate 160 MG tablet TAKE 1 TABLET BY MOUTH DAILY, Normal    ferrous sulfate 325 (65 FE) MG EC tablet Take 325 mg by  mouth 2 (two) times daily. , Historical Med    fluticasone (FLONASE) 50 MCG/ACT nasal spray Place 1 spray into both nostrils daily., Starting Wed 08/27/2016, Normal    GLOBAL EASE INJECT PEN NEEDLES 32G X 4 MM MISC USE AS DIRECTED WITH THE HUMULIN PEN INSULIN (100 DAY SUPPLY), Normal    Lancets (ONETOUCH ULTRASOFT) lancets Use as instructed, Normal    latanoprost (XALATAN) 0.005 % ophthalmic solution Place 1 drop into both eyes at bedtime., Starting Tue 08/12/2016, Historical Med    levothyroxine (SYNTHROID, LEVOTHROID) 175 MCG tablet TAKE 1 TABLET BY MOUTH EVERY DAY  BEFORE BREAKFAST, Normal    NEEDLE, DISP, 25 G (BD DISP NEEDLES) 25G X 5/8" MISC Use as directed to administer  the b12 injection monthly, Normal    ONE TOUCH ULTRA TEST test strip USE TO TEST BLOOD SUGAR 4 TIMES DAILY DX: E10.22, Normal    rosuvastatin (CRESTOR) 20 MG tablet Take 1 tablet (20 mg total) by mouth daily., Starting Wed 04/02/2016, Normal    saccharomyces boulardii (FLORASTOR) 250 MG capsule Take 1 capsule (250 mg total) by mouth 2 (two) times daily., Starting Tue 08/26/2016, Normal       Allergies  Allergen Reactions  . Codeine Other (See Comments)    REACTION: change in mental status  . Diphenhydramine Hcl Itching  . Quinine Other (See Comments)    REACTION: abnormal sensations in face  . Sulfonamide Derivatives Rash   Discharge Instructions    Diet - low sodium heart healthy    Complete by:  As directed    Diet Carb Modified    Complete by:  As directed    Increase activity slowly    Complete by:  As directed      Discharge Exam: Filed Weights   08/26/16 1917 08/28/16 2041  Weight: 79.4 kg (175 lb) 79.5 kg (175 lb 4.3 oz)   Vitals:   08/29/16 0517 08/29/16 0900  BP: (!) 157/60 (!) 145/58  Pulse: 78 78  Resp: 17 18  Temp: 97.6 F (36.4 C) 98 F (36.7 C)   General: Appear in no distress, no Rash; Oral Mucosa moist. Cardiovascular: S1 and S2 Present, no Murmur, no JVD Respiratory: Bilateral Air entry present and Clear to Auscultation, no Crackles, no wheezes Abdomen: Bowel Sound present, Soft and no tenderness Extremities: no Pedal edema, n calf tenderness Neurology: Grossly no focal neuro deficit.  The results of significant diagnostics from this hospitalization (including imaging, microbiology, ancillary and laboratory) are listed below for reference.    Significant Diagnostic Studies: Dg Chest 2 View  Result Date: 08/26/2016 CLINICAL DATA:  Productive cough and fever EXAM: CHEST  2 VIEW COMPARISON:  08/23/2016 FINDINGS: Cardiac shadow is mildly  enlarged but stable. Aortic calcifications are again seen. Small bilateral pleural effusions are noted with bibasilar atelectasis. No focal infiltrate is seen. No bony abnormality is noted. IMPRESSION: Small bilateral pleural effusions and basilar atelectasis. Electronically Signed   By: Inez Catalina M.D.   On: 08/26/2016 21:13   Ct Head Wo Contrast  Result Date: 08/24/2016 CLINICAL DATA:  Confusion EXAM: CT HEAD WITHOUT CONTRAST TECHNIQUE: Contiguous axial images were obtained from the base of the skull through the vertex without intravenous contrast. COMPARISON:  11/08/2008 FINDINGS: Brain: No acute intracranial abnormality. Specifically, no hemorrhage, hydrocephalus, mass lesion, acute infarction, or significant intracranial injury. Vascular: No hyperdense vessel or unexpected calcification. Skull: No acute calvarial abnormality. Sinuses/Orbits: Mild mucosal thickening in the maxillary sinuses. Other: None IMPRESSION: No acute intracranial abnormality. Chronic sinusitis.  Electronically Signed   By: Rolm Baptise M.D.   On: 08/24/2016 08:50   Dg Chest Port 1 View  Result Date: 08/23/2016 CLINICAL DATA:  83 year old female with cough and chills. EXAM: PORTABLE CHEST 1 VIEW COMPARISON:  Chest radiograph dated 07/04/2015 FINDINGS: Stable mild cardiomegaly. No focal consolidation, pleural effusion, or pneumothorax. No acute osseous pathology. IMPRESSION: No acute cardiopulmonary process. Stable mild cardiomegaly. Electronically Signed   By: Anner Crete M.D.   On: 08/23/2016 06:53   Ct Renal Stone Study  Result Date: 08/25/2016 CLINICAL DATA:  83 year old female with fever. Patient denies abdominal pain currently. Prior appendectomy. Initial encounter. EXAM: CT ABDOMEN AND PELVIS WITHOUT CONTRAST TECHNIQUE: Multidetector CT imaging of the abdomen and pelvis was performed following the standard protocol without IV contrast. COMPARISON:  No comparison CT. Abdominal sonogram 05/07/2015. 08/23/2016 chest  x-ray. FINDINGS: Lower chest: Bibasilar subsegmental atelectasis with small pleural effusions greater on right. Peribronchial thickening greater left lower lobe may represent changes of bronchitis. Heart slightly enlarged. Coronary artery calcifications. Small pericardial effusion. Hepatobiliary: Fatty liver. Taking into account limitation by non contrast imaging, no worrisome mass. 3 small calcified gallstones. Limited for evaluating for gallbladder inflammation given the motion degradation. There may be a tiny amount of peri hepatic and pericholecystic fluid. If primary gallbladder abnormality were of concern, ultrasound may consider. Pancreas: Taking into account limitation by non contrast imaging, no mass or inflammation. Duodenal diverticulum pancreatic head level. Mild motion artifact. Spleen: Elongated spleen spanning over 14 Cm. Taking into account limitation by non contrast imaging, no focal mass. Adrenals/Urinary Tract: Minimal nodularity left adrenal gland may represent small adenoma. No right adrenal mass. No renal or ureteral obstructing stone or evidence hydronephrosis. Renal lesions noted, larger ones which are cysts and some too small to adequately characterize. Calcification in the kidneys may be related to tiny nonobstructing renal calculi or vascular calcifications. Mild infiltration and perirenal fat bilaterally may be related to chronic changes. Cannot exclude result of acute inflammation in proper clinical setting. Decompressed urinary bladder with circumferential wall thickening. Stomach/Bowel: Portions of the stomach and bowel under distended. No discrete bowel inflammatory process or obvious mass identified. Duodenal diverticulum. Vascular/Lymphatic: Calcified plaque aorta without aneurysm. Calcification femoral arteries and iliac arteries. Prominent calcification and ectatic splenic artery. No adenopathy. Reproductive: 2.8 cm calcified uterine fibroid. Right ovarian 2.1 cm septated cyst.  Pelvic sonogram can be obtained for further delineation if clinically desired. Other: Mild haziness small bowel mesentery possibly chronic. No free intraperitoneal air. No bowel containing hernia. Musculoskeletal: No acute osseous abnormality. Scattered degenerative changes lumbar spine with spinal stenosis most notable L4-5. IMPRESSION: Three small calcified gallstones. Limited for evaluating for gallbladder inflammation given motion degradation. There may be a tiny amount of peri hepatic and pericholecystic fluid. If primary gallbladder abnormality were of concern, ultrasound may consider. Bibasilar subsegmental atelectasis with small pleural effusions greater on right. Peribronchial thickening greater left lower lobe may represent changes of bronchitis. No renal or ureteral obstructing stone or evidence of hydronephrosis. Mild infiltration and perirenal fat bilaterally may be related to chronic changes. Cannot exclude result of acute inflammation in proper clinical setting. Decompressed urinary bladder with circumferential wall thickening. Mild haziness small bowel mesentery, possibly chronic. 2.8 cm calcified uterine fibroid. Right ovarian 2.1 cm septated cyst. Pelvic sonogram can be obtained for further delineation if clinically desired. Heart slightly enlarged. Coronary artery calcifications. Small pericardial effusion. Fatty liver. Elongated spleen spanning over 14 cm. Electronically Signed   By: Genia Del M.D.   On:  08/25/2016 11:39    Microbiology: Recent Results (from the past 240 hour(s))  Respiratory Panel by PCR     Status: None   Collection Time: 08/24/16 11:32 AM  Result Value Ref Range Status   Adenovirus NOT DETECTED NOT DETECTED Final   Coronavirus 229E NOT DETECTED NOT DETECTED Final   Coronavirus HKU1 NOT DETECTED NOT DETECTED Final   Coronavirus NL63 NOT DETECTED NOT DETECTED Final   Coronavirus OC43 NOT DETECTED NOT DETECTED Final   Metapneumovirus NOT DETECTED NOT DETECTED  Final   Rhinovirus / Enterovirus NOT DETECTED NOT DETECTED Final   Influenza A NOT DETECTED NOT DETECTED Final   Influenza B NOT DETECTED NOT DETECTED Final   Parainfluenza Virus 1 NOT DETECTED NOT DETECTED Final   Parainfluenza Virus 2 NOT DETECTED NOT DETECTED Final   Parainfluenza Virus 3 NOT DETECTED NOT DETECTED Final   Parainfluenza Virus 4 NOT DETECTED NOT DETECTED Final   Respiratory Syncytial Virus NOT DETECTED NOT DETECTED Final   Bordetella pertussis NOT DETECTED NOT DETECTED Final   Chlamydophila pneumoniae NOT DETECTED NOT DETECTED Final   Mycoplasma pneumoniae NOT DETECTED NOT DETECTED Final    Comment: Performed at [facility], [address], [place], [place] [address]  MRSA PCR Screening     Status: None   Collection Time: 08/25/16  8:20 AM  Result Value Ref Range Status   MRSA by PCR NEGATIVE NEGATIVE Final    Comment:        The GeneXpert MRSA Assay (FDA approved for NASAL specimens only), is one component of a comprehensive MRSA colonization surveillance program. It is not intended to diagnose MRSA infection nor to guide or monitor treatment for MRSA infections.   Blood culture (routine x 2)     Status: None   Collection Time: 08/27/16 12:15 AM  Result Value Ref Range Status   Specimen Description BLOOD RIGHT ANTECUBITAL  Final   Special Requests   Final    BOTTLES DRAWN AEROBIC AND ANAEROBIC Blood Culture adequate volume   Culture NO GROWTH 5 DAYS  Final   Report Status 09/01/2016 FINAL  Final  Blood culture (routine x 2)     Status: None   Collection Time: 08/27/16  2:10 AM  Result Value Ref Range Status   Specimen Description BLOOD LEFT WRIST  Final   Special Requests   Final    BOTTLES DRAWN AEROBIC AND ANAEROBIC Blood Culture adequate volume   Culture NO GROWTH 5 DAYS  Final   Report Status 09/01/2016 FINAL  Final     Labs: CBC:  Recent Labs Lab 08/26/16 1932 08/28/16 0526 08/29/16 0456  WBC 6.8 5.6 6.5  NEUTROABS 5.5  --   --    HGB 9.2* 10.1* 9.5*  HCT 27.8* 31.2* 28.4*  MCV 91.1 90.7 89.6  PLT 150 238 740   Basic Metabolic Panel:  Recent Labs Lab 08/26/16 1932 08/28/16 0526 08/29/16 0456  NA 132* 137 139  K 3.9 4.5 4.0  CL 107 107 108  CO2 16* 19* 23  GLUCOSE 122* 312* 236*  BUN 19 29* 36*  CREATININE 1.60* 1.56* 1.50*  CALCIUM 8.6* 9.3 9.5  MG  --  2.0  --    Liver Function Tests:  Recent Labs Lab 08/26/16 1932  AST 34  ALT 18  ALKPHOS 32*  BILITOT 0.7  PROT 6.3*  ALBUMIN 3.0*   No results for input(s): LIPASE, AMYLASE in the last 168 hours. No results for input(s): AMMONIA in the last 168  hours. Cardiac Enzymes:  Recent Labs Lab 08/27/16 0210  TROPONINI 0.11*   BNP (last 3 results)  Recent Labs  08/27/16 0209  BNP 385.0*   CBG:  Recent Labs Lab 08/28/16 1154 08/28/16 1722 08/28/16 2037 08/29/16 0736 08/29/16 1146  GLUCAP 283* 308* 311* 262* 310*   Time spent: 35 minutes  Signed:  Kynslei Art  [facility] 08/29/2016 , 1:47 PM

## 2016-09-03 ENCOUNTER — Encounter: Payer: Self-pay | Admitting: Nurse Practitioner

## 2016-09-03 ENCOUNTER — Ambulatory Visit (INDEPENDENT_AMBULATORY_CARE_PROVIDER_SITE_OTHER)
Admission: RE | Admit: 2016-09-03 | Discharge: 2016-09-03 | Disposition: A | Discharge status: Home / Self Care | Payer: Medicare Other | Source: Ambulatory Visit | Attending: Nurse Practitioner | Admitting: Nurse Practitioner

## 2016-09-03 ENCOUNTER — Ambulatory Visit (INDEPENDENT_AMBULATORY_CARE_PROVIDER_SITE_OTHER): Payer: Medicare Other | Admitting: Nurse Practitioner

## 2016-09-03 ENCOUNTER — Other Ambulatory Visit: Payer: Medicare Other

## 2016-09-03 ENCOUNTER — Telehealth: Payer: Self-pay | Admitting: Endocrinology

## 2016-09-03 ENCOUNTER — Other Ambulatory Visit (INDEPENDENT_AMBULATORY_CARE_PROVIDER_SITE_OTHER): Payer: Medicare Other

## 2016-09-03 VITALS — BP 130/58 | HR 88 | Temp 98.4°F | Ht 66.0 in | Wt 164.0 lb

## 2016-09-03 DIAGNOSIS — I5031 Acute diastolic (congestive) heart failure: Secondary | ICD-10-CM

## 2016-09-03 DIAGNOSIS — Z794 Long term (current) use of insulin: Secondary | ICD-10-CM

## 2016-09-03 DIAGNOSIS — E162 Hypoglycemia, unspecified: Secondary | ICD-10-CM | POA: Diagnosis not present

## 2016-09-03 DIAGNOSIS — N183 Chronic kidney disease, stage 3 unspecified: Secondary | ICD-10-CM

## 2016-09-03 DIAGNOSIS — J209 Acute bronchitis, unspecified: Secondary | ICD-10-CM | POA: Diagnosis not present

## 2016-09-03 DIAGNOSIS — E1122 Type 2 diabetes mellitus with diabetic chronic kidney disease: Secondary | ICD-10-CM | POA: Diagnosis not present

## 2016-09-03 DIAGNOSIS — N3000 Acute cystitis without hematuria: Secondary | ICD-10-CM

## 2016-09-03 DIAGNOSIS — J90 Pleural effusion, not elsewhere classified: Secondary | ICD-10-CM | POA: Diagnosis not present

## 2016-09-03 LAB — URINALYSIS WITH CULTURE, IF INDICATED
BILIRUBIN URINE: NEGATIVE
HGB URINE DIPSTICK: NEGATIVE
KETONES UR: NEGATIVE
LEUKOCYTES UA: NEGATIVE
NITRITE: NEGATIVE
PH: 5.5 (ref 5.0–8.0)
RBC / HPF: NONE SEEN (ref 0–?)
Specific Gravity, Urine: 1.025 (ref 1.000–1.030)
Total Protein, Urine: 100 — AB
UROBILINOGEN UA: 0.2 (ref 0.0–1.0)
Urine Glucose: NEGATIVE

## 2016-09-03 NOTE — Patient Instructions (Addendum)
CXR indicates resolving effusion. Urinalysis indicates resolved UTI.  Hold NPH use if Glucose before breakfast and at bedtime is less than 200. Dr. Cordelia Pen office will call with further instruction.  Encourage adequate oral hydration and small frequent meals.   Hypoglycemia Hypoglycemia is when the sugar (glucose) level in the blood is too low. Symptoms of low blood sugar may include:  Feeling: ? Hungry. ? Worried or nervous (anxious). ? Sweaty and clammy. ? Confused. ? Dizzy. ? Sleepy. ? Sick to your stomach (nauseous).  Having: ? A fast heartbeat. ? A headache. ? A change in your vision. ? Jerky movements that you cannot control (seizure). ? Nightmares. ? Tingling or no feeling (numbness) around the mouth, lips, or tongue.  Having trouble with: ? Talking. ? Paying attention (concentrating). ? Moving (coordination). ? Sleeping.  Shaking.  Passing out (fainting).  Getting upset easily (irritability).  Low blood sugar can happen to people who have diabetes and people who do not have diabetes. Low blood sugar can happen quickly, and it can be an emergency. Treating Low Blood Sugar Low blood sugar is often treated by eating or drinking something sugary right away. If you can think clearly and swallow safely, follow the 15:15 rule:  Take 15 grams of a fast-acting carb (carbohydrate). Some fast-acting carbs are: ? 1 tube of glucose gel. ? 3 sugar tablets (glucose pills). ? 6-8 pieces of hard candy. ? 4 oz (120 mL) of fruit juice. ? 4 oz (120 mL) of regular (not diet) soda.  Check your blood sugar 15 minutes after you take the carb.  If your blood sugar is still at or below 70 mg/dL (3.9 mmol/L), take 15 grams of a carb again.  If your blood sugar does not go above 70 mg/dL (3.9 mmol/L) after 3 tries, get help right away.  After your blood sugar goes back to normal, eat a meal or a snack within 1 hour.  Treating Very Low Blood Sugar If your blood sugar is at  or below 54 mg/dL (3 mmol/L), you have very low blood sugar (severe hypoglycemia). This is an emergency. Do not wait to see if the symptoms will go away. Get medical help right away. Call your local emergency services (911 in the [place] Do not drive yourself to the hospital. If you have very low blood sugar and you cannot eat or drink, you may need a glucagon shot (injection). A family member or friend should learn how to check your blood sugar and how to give you a glucagon shot. Ask your doctor if you need to have a glucagon shot kit at home. Follow these instructions at home: General instructions  Avoid any diets that cause you to not eat enough food. Talk with your doctor before you start any new diet.  Take over-the-counter and prescription medicines only as told by your doctor.  Limit alcohol to no more than 1 drink per day for nonpregnant women and 2 drinks per day for men. One drink equals 12 oz of beer, 5 oz of wine, or 1 oz of hard liquor.  Keep all follow-up visits as told by your doctor. This is important. If You Have Diabetes:   Make sure you know the symptoms of low blood sugar.  Always keep a source of sugar with you, such as: ? Sugar. ? Sugar tablets. ? Glucose gel. ? Fruit juice. ? Regular soda (not diet soda). ? Milk. ? Hard candy. ? Honey.  Take your medicines as told.  Follow  your exercise and meal plan. ? Eat on time. Do not skip meals. ? Follow your sick day plan when you cannot eat or drink normally. Make this plan ahead of time with your doctor.  Check your blood sugar as often as told by your doctor. Always check before and after exercise.  Share your diabetes care plan with: ? Your work or school. ? People you live with.  Check your pee (urine) for ketones: ? When you are sick. ? As told by your doctor.  Carry a card or wear jewelry that says you have diabetes. If You Have Low Blood Sugar From Other Causes:   Check your blood sugar as often as  told by your doctor.  Follow instructions from your doctor about what you cannot eat or drink. Contact a doctor if:  You have trouble keeping your blood sugar in your target range.  You have low blood sugar often. Get help right away if:  You still have symptoms after you eat or drink something sugary.  Your blood sugar is at or below 54 mg/dL (3 mmol/L).  You have jerky movements that you cannot control.  You pass out. These symptoms may be an emergency. Do not wait to see if the symptoms will go away. Get medical help right away. Call your local emergency services (911 in the [place] Do not drive yourself to the hospital. This information is not intended to replace advice given to you by your health care provider. Make sure you discuss any questions you have with your health care provider. Document Released: 04/23/2009 Document Revised: 07/05/2015 Document Reviewed: 03/02/2015 Elsevier Interactive Patient Education  2018 [place] Eating Plan DASH stands for "Dietary Approaches to Stop Hypertension." The DASH eating plan is a healthy eating plan that has been shown to reduce high blood pressure (hypertension). It may also reduce your risk for type 2 diabetes, heart disease, and stroke. The DASH eating plan may also help with weight loss. What are tips for following this plan? General guidelines  Avoid eating more than 2,300 mg (milligrams) of salt (sodium) a day. If you have hypertension, you may need to reduce your sodium intake to 1,500 mg a day.  Limit alcohol intake to no more than 1 drink a day for nonpregnant women and 2 drinks a day for men. One drink equals 12 oz of beer, 5 oz of wine, or 1 oz of hard liquor.  Work with your health care provider to maintain a healthy body weight or to lose weight. Ask what an ideal weight is for you.  Get at least 30 minutes of exercise that causes your heart to beat faster (aerobic exercise) most days of the week. Activities  may include walking, swimming, or biking.  Work with your health care provider or diet and nutrition specialist (dietitian) to adjust your eating plan to your individual calorie needs. Reading food labels  Check food labels for the amount of sodium per serving. Choose foods with less than 5 percent of the Daily Value of sodium. Generally, foods with less than 300 mg of sodium per serving fit into this eating plan.  To find whole grains, look for the word "whole" as the first word in the ingredient list. Shopping  Buy products labeled as "low-sodium" or "no salt added."  Buy fresh foods. Avoid canned foods and premade or frozen meals. Cooking  Avoid adding salt when cooking. Use salt-free seasonings or herbs instead of table salt or sea salt.  Check with your health care provider or pharmacist before using salt substitutes.  Do not fry foods. Cook foods using healthy methods such as baking, boiling, grilling, and broiling instead.  Cook with heart-healthy oils, such as olive, canola, soybean, or sunflower oil. Meal planning   Eat a balanced diet that includes: ? 5 or more servings of fruits and vegetables each day. At each meal, try to fill half of your plate with fruits and vegetables. ? Up to 6-8 servings of whole grains each day. ? Less than 6 oz of lean meat, poultry, or fish each day. A 3-oz serving of meat is about the same size as a deck of cards. One egg equals 1 oz. ? 2 servings of low-fat dairy each day. ? A serving of nuts, seeds, or beans 5 times each week. ? Heart-healthy fats. Healthy fats called Omega-3 fatty acids are found in foods such as flaxseeds and coldwater fish, like sardines, salmon, and mackerel.  Limit how much you eat of the following: ? Canned or prepackaged foods. ? Food that is high in trans fat, such as fried foods. ? Food that is high in saturated fat, such as fatty meat. ? Sweets, desserts, sugary drinks, and other foods with added sugar. ? Full-fat  dairy products.  Do not salt foods before eating.  Try to eat at least 2 vegetarian meals each week.  Eat more home-cooked food and less restaurant, buffet, and fast food.  When eating at a restaurant, ask that your food be prepared with less salt or no salt, if possible. What foods are recommended? The items listed may not be a complete list. Talk with your dietitian about what dietary choices are best for you. Grains Whole-grain or whole-wheat bread. Whole-grain or whole-wheat pasta. Brown rice. [place]. Bulgur. Whole-grain and low-sodium cereals. Pita bread. Low-fat, low-sodium crackers. Whole-wheat flour tortillas. Vegetables Fresh or frozen vegetables (raw, steamed, roasted, or grilled). Low-sodium or reduced-sodium tomato and vegetable juice. Low-sodium or reduced-sodium tomato sauce and tomato paste. Low-sodium or reduced-sodium canned vegetables. Fruits All fresh, dried, or frozen fruit. Canned fruit in natural juice (without added sugar). Meat and other protein foods Skinless chicken or [place]. Ground chicken or [place]. Pork with fat trimmed off. Fish and seafood. Egg whites. Dried beans, peas, or lentils. Unsalted nuts, nut butters, and seeds. Unsalted canned beans. Lean cuts of beef with fat trimmed off. Low-sodium, lean deli meat. Dairy Low-fat (1%) or fat-free (skim) milk. Fat-free, low-fat, or reduced-fat cheeses. Nonfat, low-sodium ricotta or cottage cheese. Low-fat or nonfat yogurt. Low-fat, low-sodium cheese. Fats and oils Soft margarine without trans fats. Vegetable oil. Low-fat, reduced-fat, or light mayonnaise and salad dressings (reduced-sodium). Canola, safflower, olive, soybean, and sunflower oils. Avocado. Seasoning and other foods Herbs. Spices. Seasoning mixes without salt. Unsalted popcorn and pretzels. Fat-free sweets. What foods are not recommended? The items listed may not be a complete list. Talk with your dietitian about what dietary choices are best  for you. Grains Baked goods made with fat, such as croissants, muffins, or some breads. Dry pasta or rice meal packs. Vegetables Creamed or fried vegetables. Vegetables in a cheese sauce. Regular canned vegetables (not low-sodium or reduced-sodium). Regular canned tomato sauce and paste (not low-sodium or reduced-sodium). Regular tomato and vegetable juice (not low-sodium or reduced-sodium). Angie Fava. Olives. Fruits Canned fruit in a light or heavy syrup. Fried fruit. Fruit in cream or butter sauce. Meat and other protein foods Fatty cuts of meat. Ribs. Fried meat. Berniece Salines. Sausage. Bologna and other  processed lunch meats. Salami. Fatback. Hotdogs. Bratwurst. Salted nuts and seeds. Canned beans with added salt. Canned or smoked fish. Whole eggs or egg yolks. Chicken or [place] with skin. Dairy Whole or 2% milk, cream, and half-and-half. Whole or full-fat cream cheese. Whole-fat or sweetened yogurt. Full-fat cheese. Nondairy creamers. Whipped toppings. Processed cheese and cheese spreads. Fats and oils Butter. Stick margarine. Lard. Shortening. Ghee. Bacon fat. Tropical oils, such as coconut, palm kernel, or palm oil. Seasoning and other foods Salted popcorn and pretzels. Onion salt, garlic salt, seasoned salt, table salt, and sea salt. Worcestershire sauce. Tartar sauce. Barbecue sauce. Teriyaki sauce. Soy sauce, including reduced-sodium. Steak sauce. Canned and packaged gravies. Fish sauce. Oyster sauce. Cocktail sauce. Horseradish that you find on the shelf. Ketchup. Mustard. Meat flavorings and tenderizers. Bouillon cubes. Hot sauce and Tabasco sauce. Premade or packaged marinades. Premade or packaged taco seasonings. Relishes. Regular salad dressings. Where to find more information:  National Heart, Lung, and [place]: https://wilson-eaton.com/  American Heart Association: www.heart.org Summary  The DASH eating plan is a healthy eating plan that has been shown to reduce high blood pressure  (hypertension). It may also reduce your risk for type 2 diabetes, heart disease, and stroke.  With the DASH eating plan, you should limit salt (sodium) intake to 2,300 mg a day. If you have hypertension, you may need to reduce your sodium intake to 1,500 mg a day.  When on the DASH eating plan, aim to eat more fresh fruits and vegetables, whole grains, lean proteins, low-fat dairy, and heart-healthy fats.  Work with your health care provider or diet and nutrition specialist (dietitian) to adjust your eating plan to your individual calorie needs. This information is not intended to replace advice given to you by your health care provider. Make sure you discuss any questions you have with your health care provider. Document Released: 01/16/2011 Document Revised: 01/21/2016 Document Reviewed: 01/21/2016 Elsevier Interactive Patient Education  2017 [employer].

## 2016-09-03 NOTE — Progress Notes (Signed)
Subjective:  Patient ID: Jocelyn Mendez, female    DOB: 1930/05/24  Age: 83 y.o. MRN: [id number]  CC: Hospitalization Follow-up (hosptail fu/infection and fluid in lungs--still weak/med consult/low sugar)   HPI  TCM not completed.  Mrs. Knudsen accompanied by her daughter. She admitted to hospital twice in last 52month. First hospitalization 07/14-07/17 for urosepsis. Second hospitalization 07/18-07/20 for acute CHF exacerbation. She is at home with son, daughter lives next door.  DM: Glucose  At home  hypogycemia (60). Takes 60units of NPH after breakfast. poor appetite.  Also complains of generalized weakness, but does not want PT. States she will do home exercises on her own with help of her son and daughter,   Outpatient Medications Prior to Visit  Medication Sig Dispense Refill  . albuterol (PROVENTIL HFA;VENTOLIN HFA) 108 (90 BASE) MCG/ACT inhaler Inhale 2 puffs into the lungs every 6 (six) hours as needed for wheezing or shortness of breath. 1 Inhaler 6  . amLODipine (NORVASC) 5 MG tablet Take 2 tablets (10 mg total) by mouth daily. 60 tablet 0  . aspirin 81 MG tablet Take 81 mg by mouth daily.      . Cholecalciferol (VITAMIN D3) 2000 UNITS TABS Take 2,000 Units by mouth 2 (two) times daily.     . cyanocobalamin (,VITAMIN B-12,) 1000 MCG/ML injection INJECT 1ML INTO THE MUSCLE ONCE A MONTH (Patient taking differently: INJECT 100 MCG INTO THE MUSCLE ONCE A MONTH) 1 mL 11  . fenofibrate 160 MG tablet TAKE 1 TABLET BY MOUTH DAILY (Patient taking differently: Take 160 mg by mouth daily. ) 30 tablet 5  . ferrous sulfate 325 (65 FE) MG EC tablet Take 325 mg by mouth 2 (two) times daily.     . fluticasone (FLONASE) 50 MCG/ACT nasal spray Place 1 spray into both nostrils daily. 16 g 0  . GLOBAL EASE INJECT PEN NEEDLES 32G X 4 MM MISC USE AS DIRECTED WITH THE HUMULIN PEN INSULIN (100 DAY SUPPLY) 100 each 2  . guaiFENesin (MUCINEX) 600 MG 12 hr tablet Take 1 tablet (600 mg total)  by mouth 2 (two) times daily. 30 tablet 0  . Insulin NPH, Human,, Isophane, (HUMULIN N) 100 UNIT/ML Kiwkpen Inject 50 Units into the skin 2 (two) times daily before a meal. 45 mL 0  . Lancets (ONETOUCH ULTRASOFT) lancets Use as instructed 100 each 6  . latanoprost (XALATAN) 0.005 % ophthalmic solution Place 1 drop into both eyes at bedtime.  11  . levothyroxine (SYNTHROID, LEVOTHROID) 175 MCG tablet TAKE 1 TABLET BY MOUTH EVERY DAY BEFORE BREAKFAST (Patient taking differently: TAKE 175 MCG BY MOUTH EVERY DAY BEFORE BREAKFAST) 90 tablet 3  . NEEDLE, DISP, 25 G (BD DISP NEEDLES) 25G X 5/8" MISC Use as directed to administer  the b12 injection monthly 50 each 2  . ONE TOUCH ULTRA TEST test strip USE TO TEST BLOOD SUGAR 4 TIMES DAILY DX: E10.22 100 each 2  . rosuvastatin (CRESTOR) 20 MG tablet Take 1 tablet (20 mg total) by mouth daily. 90 tablet 1  . saccharomyces boulardii (FLORASTOR) 250 MG capsule Take 1 capsule (250 mg total) by mouth 2 (two) times daily. 60 capsule 0  . furosemide (LASIX) 20 MG tablet Take 1 tablet (20 mg total) by mouth daily as needed for fluid or edema (weight gain of more than 3Lbs in 1 day, or 5 Lbs in 2 days.). (Patient not taking: Reported on 09/03/2016) 30 tablet 0  . polyethylene glycol (MIRALAX / GLYCOLAX) packet Take  17 g by mouth daily. (Patient not taking: Reported on 09/03/2016) 14 each 0  . predniSONE (DELTASONE) 10 MG tablet Take 40mg  daily for 3days,Take 30mg  daily for 3days,Take 20mg  daily for 3days,Take 10mg  daily for 3days, then stop (Patient not taking: Reported on 09/03/2016) 30 tablet 0   No facility-administered medications prior to visit.     ROS See HPI  Objective:  BP (!) 130/58   Pulse 88   Temp 98.4 F (36.9 C)   Ht 5\' 6"  (1.676 m)   Wt 164 lb (74.4 kg)   SpO2 97%   BMI 26.47 kg/m   BP Readings from Last 3 Encounters:  09/03/16 (!) 130/58  08/29/16 (!) 145/58  08/26/16 129/74    Wt Readings from Last 3 Encounters:  09/03/16 164 lb  (74.4 kg)  08/28/16 175 lb 4.3 oz (79.5 kg)  08/24/16 173 lb 15.1 oz (78.9 kg)    Physical Exam  Constitutional: She is oriented to person, place, and time. No distress.  Neck: No JVD present.  Cardiovascular: Normal rate and regular rhythm.   Pulmonary/Chest: Effort normal. No respiratory distress. She has rales.  Musculoskeletal: She exhibits no tenderness.  Neurological: She is alert and oriented to person, place, and time.  Skin: Skin is warm and dry.  Vitals reviewed.   Lab Results  Component Value Date   WBC 6.5 08/29/2016   HGB 9.5 (L) 08/29/2016   HCT 28.4 (L) 08/29/2016   PLT 303 08/29/2016   GLUCOSE 236 (H) 08/29/2016   CHOL 118 04/15/2016   TRIG 301.0 (H) 04/15/2016   HDL 22.60 (L) 04/15/2016   LDLDIRECT 58.0 04/15/2016   LDLCALC 85 03/19/2011   ALT 18 08/26/2016   AST 34 08/26/2016   NA 139 08/29/2016   K 4.0 08/29/2016   CL 108 08/29/2016   CREATININE 1.50 (H) 08/29/2016   BUN 36 (H) 08/29/2016   CO2 23 08/29/2016   TSH 1.289 08/27/2016   INR 1.36 06/12/2010   HGBA1C 7.8 08/04/2016   MICROALBUR 43.3 (H) 09/20/2013    Dg Chest 2 View  Result Date: 08/26/2016 CLINICAL DATA:  Productive cough and fever EXAM: CHEST  2 VIEW COMPARISON:  08/23/2016 FINDINGS: Cardiac shadow is mildly enlarged but stable. Aortic calcifications are again seen. Small bilateral pleural effusions are noted with bibasilar atelectasis. No focal infiltrate is seen. No bony abnormality is noted. IMPRESSION: Small bilateral pleural effusions and basilar atelectasis. Electronically Signed   By: Inez Catalina M.D.   On: 08/26/2016 21:13    Assessment & Plan:  Collaborated with Dr. Loanne Drilling. His office will call patient with further insulin instruction.  Damisha was seen today for hospitalization follow-up.  Diagnoses and all orders for this visit:  Acute diastolic (congestive) heart failure ([place]) -     DG Chest 2 View; Future  Acute bronchitis, unspecified organism -     DG Chest 2  View; Future  Acute cystitis without hematuria -     Urinalysis with Culture, if indicated; Future   I am having Ms. Suess maintain her aspirin, ferrous sulfate, onetouch ultrasoft, Vitamin D3, albuterol, NEEDLE (DISP) 25 G, cyanocobalamin, rosuvastatin, fenofibrate, levothyroxine, ONE TOUCH ULTRA TEST, GLOBAL EASE INJECT PEN NEEDLES, latanoprost, fluticasone, amLODipine, saccharomyces boulardii, guaiFENesin, Insulin NPH (Human) (Isophane), polyethylene glycol, predniSONE, and furosemide.  No orders of the defined types were placed in this encounter.   Follow-up: Return in about 3 months (around 12/04/2016) for DM and HTN.  Wilfred Lacy, NP

## 2016-09-03 NOTE — Telephone Encounter (Signed)
please call patient: Please resume the NPH insulin to 30 units each morning, and none in the evening. Please call or message [place] next week, to tell us how the blood sugar is doing.

## 2016-09-04 ENCOUNTER — Telehealth: Payer: Self-pay | Admitting: Nurse Practitioner

## 2016-09-04 LAB — URINE CULTURE

## 2016-09-04 NOTE — Telephone Encounter (Signed)
The message I got from PCP said she was having hypoglycemia despite reducing to 60 units qam, but please verify, thanks.

## 2016-09-04 NOTE — Telephone Encounter (Signed)
Left vm for pt to call back,need to inform massage below.

## 2016-09-04 NOTE — Telephone Encounter (Signed)
[name] verbalized understnd

## 2016-09-04 NOTE — Telephone Encounter (Signed)
-----   Message from Flossie Buffy, NP sent at 09/04/2016  7:39 AM EDT ----- Please inform patient that Dr. Cordelia Pen office will be calling her with further insulin instructions.  Thank you.  ----- Message ----- From: Renato Shin, MD Sent: 09/03/2016   6:33 PM To: Flossie Buffy, NP  Hi [place]: Since her recent hospitalization, it will probably take time for her insulin need to return to baseline.  We'll call her, and ask her to resume the NPH at a lower dosage.  I have noted at her ov's, she only needs the insulin in the morning.  Thanks for the message.  Hilliard Clark   ----- Message ----- From: Flossie Buffy, NP Sent: 09/03/2016   1:51 PM To: Renato Shin, MD  Hello Dr. Loanne Drilling, Mrs. Eischeid is a patient of Dr. Sharlet Salina, but I saw her in office today for hospital follow up. During her visit, she reports symptomatic hypoglycemia  since discharge from hospital (glucose 60-70, more than 2episodes in morning and evening). She now takes 60units of NPH with breakfast and glucose ranges from 150-120. I have instructed her to hold NPH till I contact her with further instructions. What will you recommend?

## 2016-09-05 NOTE — Telephone Encounter (Signed)
Blood sugar keeps dropping. Please call [name] (daughter) to advise.  Ty,  -LL

## 2016-09-05 NOTE — Telephone Encounter (Signed)
Please verify insulin is 30 units qam Then decrease to 20 units qam

## 2016-09-08 NOTE — Telephone Encounter (Signed)
I contacted the patient's daughter [name] and she clarified the patient is taking 110 units of the Humulin in the am. She was advised to decrease it down to 80 units. She voiced understanding and had no further questions at this time.

## 2016-09-08 NOTE — Telephone Encounter (Signed)
Could you please review message during Dr. Cordelia Pen absence? I contacted the patient's daughter and she advised the patient is taking 110 units of the Humulin N in the am

## 2016-09-08 NOTE — Telephone Encounter (Signed)
There is a conflict between what Dr. Loanne Drilling recommended 3 days ago and the message sent to me one hour ago. He recommended to decrease the dose of an insulin from 30 to 20 units and she is actually taking 110??? If this is correct, let's decrease the dose to 80 units? She needs to call back which sugars in 2 days.

## 2016-09-12 ENCOUNTER — Ambulatory Visit (INDEPENDENT_AMBULATORY_CARE_PROVIDER_SITE_OTHER): Payer: Medicare Other | Admitting: Nurse Practitioner

## 2016-09-12 ENCOUNTER — Encounter: Payer: Self-pay | Admitting: Nurse Practitioner

## 2016-09-12 VITALS — BP 138/60 | HR 93 | Temp 98.2°F | Ht 66.0 in | Wt 163.0 lb

## 2016-09-12 DIAGNOSIS — G9340 Encephalopathy, unspecified: Secondary | ICD-10-CM | POA: Diagnosis not present

## 2016-09-12 DIAGNOSIS — I10 Essential (primary) hypertension: Secondary | ICD-10-CM

## 2016-09-12 DIAGNOSIS — R269 Unspecified abnormalities of gait and mobility: Secondary | ICD-10-CM

## 2016-09-12 DIAGNOSIS — M6281 Muscle weakness (generalized): Secondary | ICD-10-CM

## 2016-09-12 NOTE — Progress Notes (Signed)
Subjective:  Patient ID: Jocelyn Mendez, female    DOB: May 08, 1930  Age: 83 y.o. MRN: [id number]  CC: Follow-up (driving consult--has DMV form to consult--mouth sore and red spot on hands? FYi taking 80 unite of insuline)   HPI   Jocelyn Mendez is here to discuss completion of DMV form to renew driver license. She states he daughter drives most of the time,, but will like license renewed regardless. She has limited her driving to short distances and daytime only. She denies any MVA in last 3months. Reports generalized weakness since recent hospitalization. Denies any syncope and falls since hospital discharge. She is not interested in PT referral. Has persistent LE numbness and tingling due to DM and B12 deficiency.  DM Insulin dose has been adjusted by Dr. Loanne Drilling. Denies any hypoglycemic episodes since last OV.  Outpatient Medications Prior to Visit  Medication Sig Dispense Refill  . albuterol (PROVENTIL HFA;VENTOLIN HFA) 108 (90 BASE) MCG/ACT inhaler Inhale 2 puffs into the lungs every 6 (six) hours as needed for wheezing or shortness of breath. 1 Inhaler 6  . amLODipine (NORVASC) 5 MG tablet Take 2 tablets (10 mg total) by mouth daily. 60 tablet 0  . aspirin 81 MG tablet Take 81 mg by mouth daily.      . Cholecalciferol (VITAMIN D3) 2000 UNITS TABS Take 2,000 Units by mouth 2 (two) times daily.     . cyanocobalamin (,VITAMIN B-12,) 1000 MCG/ML injection INJECT 1ML INTO THE MUSCLE ONCE A MONTH (Patient taking differently: INJECT 100 MCG INTO THE MUSCLE ONCE A MONTH) 1 mL 11  . fenofibrate 160 MG tablet TAKE 1 TABLET BY MOUTH DAILY (Patient taking differently: Take 160 mg by mouth daily. ) 30 tablet 5  . ferrous sulfate 325 (65 FE) MG EC tablet Take 325 mg by mouth 2 (two) times daily.     . fluticasone (FLONASE) 50 MCG/ACT nasal spray Place 1 spray into both nostrils daily. 16 g 0  . GLOBAL EASE INJECT PEN NEEDLES 32G X 4 MM MISC USE AS DIRECTED WITH THE HUMULIN PEN INSULIN (100  DAY SUPPLY) 100 each 2  . guaiFENesin (MUCINEX) 600 MG 12 hr tablet Take 1 tablet (600 mg total) by mouth 2 (two) times daily. 30 tablet 0  . Insulin NPH, Human,, Isophane, (HUMULIN N) 100 UNIT/ML Kiwkpen Inject 50 Units into the skin 2 (two) times daily before a meal. 45 mL 0  . Lancets (ONETOUCH ULTRASOFT) lancets Use as instructed 100 each 6  . latanoprost (XALATAN) 0.005 % ophthalmic solution Place 1 drop into both eyes at bedtime.  11  . levothyroxine (SYNTHROID, LEVOTHROID) 175 MCG tablet TAKE 1 TABLET BY MOUTH EVERY DAY BEFORE BREAKFAST (Patient taking differently: TAKE 175 MCG BY MOUTH EVERY DAY BEFORE BREAKFAST) 90 tablet 3  . NEEDLE, DISP, 25 G (BD DISP NEEDLES) 25G X 5/8" MISC Use as directed to administer  the b12 injection monthly 50 each 2  . ONE TOUCH ULTRA TEST test strip USE TO TEST BLOOD SUGAR 4 TIMES DAILY DX: E10.22 100 each 2  . polyethylene glycol (MIRALAX / GLYCOLAX) packet Take 17 g by mouth daily. 14 each 0  . predniSONE (DELTASONE) 10 MG tablet Take 40mg  daily for 3days,Take 30mg  daily for 3days,Take 20mg  daily for 3days,Take 10mg  daily for 3days, then stop 30 tablet 0  . rosuvastatin (CRESTOR) 20 MG tablet Take 1 tablet (20 mg total) by mouth daily. 90 tablet 1  . saccharomyces boulardii (FLORASTOR) 250 MG capsule Take 1 capsule (  250 mg total) by mouth 2 (two) times daily. 60 capsule 0  . furosemide (LASIX) 20 MG tablet Take 1 tablet (20 mg total) by mouth daily as needed for fluid or edema (weight gain of more than 3Lbs in 1 day, or 5 Lbs in 2 days.). (Patient not taking: Reported on 09/03/2016) 30 tablet 0   No facility-administered medications prior to visit.     ROS See HPI  Objective:  BP 138/60   Pulse 93   Temp 98.2 F (36.8 C)   Ht 5\' 6"  (1.676 m)   Wt 163 lb (73.9 kg)   SpO2 98%   BMI 26.31 kg/m   BP Readings from Last 3 Encounters:  09/12/16 138/60  09/03/16 (!) 130/58  08/29/16 (!) 145/58    Wt Readings from Last 3 Encounters:  09/12/16 163  lb (73.9 kg)  09/03/16 164 lb (74.4 kg)  08/28/16 175 lb 4.3 oz (79.5 kg)    Physical Exam  Constitutional: She is oriented to person, place, and time. No distress.  Cardiovascular: Normal rate.   Pulmonary/Chest: Effort normal.  Musculoskeletal: She exhibits no edema, tenderness or deformity.  Bilateral upper and lower extremity weakness 4/5. Slow, ataxic gait.  Neurological: She is alert and oriented to person, place, and time.  Skin: Skin is warm and dry.  Psychiatric: She has a normal mood and affect. Her behavior is normal. Thought content normal.  Vitals reviewed.   Lab Results  Component Value Date   WBC 6.5 08/29/2016   HGB 9.5 (L) 08/29/2016   HCT 28.4 (L) 08/29/2016   PLT 303 08/29/2016   GLUCOSE 236 (H) 08/29/2016   CHOL 118 04/15/2016   TRIG 301.0 (H) 04/15/2016   HDL 22.60 (L) 04/15/2016   LDLDIRECT 58.0 04/15/2016   LDLCALC 85 03/19/2011   ALT 18 08/26/2016   AST 34 08/26/2016   NA 139 08/29/2016   K 4.0 08/29/2016   CL 108 08/29/2016   CREATININE 1.50 (H) 08/29/2016   BUN 36 (H) 08/29/2016   CO2 23 08/29/2016   TSH 1.289 08/27/2016   INR 1.36 06/12/2010   HGBA1C 7.8 08/04/2016   MICROALBUR 43.3 (H) 09/20/2013    Dg Chest 2 View  Result Date: 09/03/2016 CLINICAL DATA:  Followup bilateral effusions. EXAM: CHEST  2 VIEW COMPARISON:  08/26/2016 FINDINGS: Bilateral effusions are smaller, all loss completely gone on the right and small on the left. Minimal left base atelectasis persist. Upper lungs remain clear. Mild cardiomegaly and aortic atherosclerosis again noted. No acute bone finding. IMPRESSION: Radiographic improvement. Near complete resolution of effusion on the right. Only small residual effusion on the left with mild left base atelectasis. Electronically Signed   By: Nelson Chimes M.D.   On: 09/03/2016 12:50    Assessment & Plan:   Moe was seen today for follow-up.  Diagnoses and all orders for this visit:  Generalized muscle  weakness  Essential hypertension  Encephalopathy  Gait abnormality   I am having Jocelyn Mendez maintain her aspirin, ferrous sulfate, onetouch ultrasoft, Vitamin D3, albuterol, NEEDLE (DISP) 25 G, cyanocobalamin, rosuvastatin, fenofibrate, levothyroxine, ONE TOUCH ULTRA TEST, GLOBAL EASE INJECT PEN NEEDLES, latanoprost, fluticasone, amLODipine, saccharomyces boulardii, guaiFENesin, Insulin NPH (Human) (Isophane), polyethylene glycol, predniSONE, and furosemide.  No orders of the defined types were placed in this encounter.   Follow-up: No Follow-up on file.  Wilfred Lacy, NP

## 2016-09-14 NOTE — Patient Instructions (Signed)
You will be contacted with form is completed.

## 2016-09-16 DIAGNOSIS — Z0279 Encounter for issue of other medical certificate: Secondary | ICD-10-CM

## 2016-09-23 ENCOUNTER — Other Ambulatory Visit: Payer: Self-pay | Admitting: Internal Medicine

## 2016-10-07 ENCOUNTER — Other Ambulatory Visit: Payer: Self-pay | Admitting: Internal Medicine

## 2016-10-18 ENCOUNTER — Other Ambulatory Visit: Payer: Self-pay | Admitting: Endocrinology

## 2016-10-23 ENCOUNTER — Other Ambulatory Visit: Payer: Self-pay

## 2016-10-23 ENCOUNTER — Other Ambulatory Visit: Payer: Self-pay | Admitting: Internal Medicine

## 2016-10-23 MED ORDER — FENOFIBRATE 160 MG PO TABS: ORAL_TABLET | ORAL | 5 refills | Status: DC

## 2016-10-29 ENCOUNTER — Encounter: Payer: Self-pay | Admitting: Internal Medicine

## 2016-10-29 ENCOUNTER — Ambulatory Visit (INDEPENDENT_AMBULATORY_CARE_PROVIDER_SITE_OTHER): Payer: Medicare Other | Admitting: Internal Medicine

## 2016-10-29 VITALS — BP 108/60 | HR 104 | Temp 98.4°F | Ht 66.0 in | Wt 159.0 lb

## 2016-10-29 DIAGNOSIS — R609 Edema, unspecified: Secondary | ICD-10-CM

## 2016-10-29 DIAGNOSIS — Z23 Encounter for immunization: Secondary | ICD-10-CM | POA: Diagnosis not present

## 2016-10-29 NOTE — Assessment & Plan Note (Signed)
Have ordered US venous of the right arm to rule out DVT as this would require treatment. The swelling is noticeable on exam but no lump or mass discernable on exam.

## 2016-10-29 NOTE — Patient Instructions (Signed)
We are getting ultrasound of the arm to check for problems with the swelling.   You do not need labs today.

## 2016-10-29 NOTE — Progress Notes (Signed)
   Subjective:    Patient ID: Jocelyn Mendez, female    DOB: July 12, 1930, 83 y.o.   MRN: [id number]  HPI The patient is an 83 YO female coming in for follow up from hospital (has followed up several times from heart failure and infection) with concerns about right arm swelling in the lower arm. She is not sure when it started but mildly better in the last day. It is sore to touch and some lumps that they feel. She denies SOB or chest pains. No fevers or chills. She cannot recall if there was an iv or blood draw in that location.   Review of Systems  Constitutional: Positive for activity change, appetite change and fatigue. Negative for chills, fever and unexpected weight change.  HENT: Negative.   Eyes: Negative.   Respiratory: Negative.   Cardiovascular: Negative for chest pain, palpitations and leg swelling.       Right arm swelling  Gastrointestinal: Negative.   Musculoskeletal: Positive for arthralgias and myalgias.  Skin: Negative.   Neurological: Positive for weakness.      Objective:   Physical Exam  Constitutional: She is oriented to person, place, and time. She appears well-developed and well-nourished.  HENT:  Head: Normocephalic and atraumatic.  Eyes: EOM are normal.  Neck: Normal range of motion.  Cardiovascular: Normal rate and regular rhythm.   Pulmonary/Chest: Effort normal and breath sounds normal.  Abdominal: Soft.  Musculoskeletal: She exhibits edema and tenderness.  Pain in the left shoulder and scapular region. Swelling in the lower right arm without mass detected  Neurological: She is alert and oriented to person, place, and time.  Slow gait  Skin: Skin is warm and dry.   Vitals:   10/29/16 1030  BP: 108/60  Pulse: (!) 104  Temp: 98.4 F (36.9 C)  TempSrc: Oral  SpO2: 99%  Weight: 159 lb (72.1 kg)  Height: 5\' 6"  (1.676 m)      Assessment & Plan:  Flu shot given at visit.

## 2016-11-03 ENCOUNTER — Other Ambulatory Visit: Payer: Self-pay | Admitting: Internal Medicine

## 2016-11-03 DIAGNOSIS — R609 Edema, unspecified: Secondary | ICD-10-CM

## 2016-11-10 ENCOUNTER — Ambulatory Visit
Admission: RE | Admit: 2016-11-10 | Discharge: 2016-11-10 | Disposition: A | Discharge status: Home / Self Care | Payer: Medicare Other | Source: Ambulatory Visit | Attending: Internal Medicine | Admitting: Internal Medicine

## 2016-11-10 DIAGNOSIS — R609 Edema, unspecified: Secondary | ICD-10-CM

## 2016-11-10 DIAGNOSIS — M7989 Other specified soft tissue disorders: Secondary | ICD-10-CM | POA: Diagnosis not present

## 2016-12-04 ENCOUNTER — Ambulatory Visit (INDEPENDENT_AMBULATORY_CARE_PROVIDER_SITE_OTHER): Payer: Medicare Other | Admitting: Endocrinology

## 2016-12-04 ENCOUNTER — Encounter: Payer: Self-pay | Admitting: Endocrinology

## 2016-12-04 VITALS — BP 130/58 | HR 106 | Wt 164.6 lb

## 2016-12-04 DIAGNOSIS — E1122 Type 2 diabetes mellitus with diabetic chronic kidney disease: Secondary | ICD-10-CM | POA: Diagnosis not present

## 2016-12-04 DIAGNOSIS — Z794 Long term (current) use of insulin: Secondary | ICD-10-CM

## 2016-12-04 DIAGNOSIS — N183 Chronic kidney disease, stage 3 unspecified: Secondary | ICD-10-CM

## 2016-12-04 LAB — POCT GLYCOSYLATED HEMOGLOBIN (HGB A1C): HEMOGLOBIN A1C: 6.4

## 2016-12-04 MED ORDER — INSULIN ISOPHANE HUMAN 100 UNIT/ML KWIKPEN: 60.0000 [IU] | PEN_INJECTOR | Freq: Two times a day (BID) | SUBCUTANEOUS | 0 refills | Status: DC

## 2016-12-04 NOTE — Progress Notes (Signed)
Subjective:    Patient ID: Jocelyn Mendez, female    DOB: 19-Dec-1930, 83 y.o.   MRN: [id number]  HPI Pt returns for f/u of diabetes mellitus: DM type: Insulin-requiring type 2 Dx'ed: 1988.   Complications: polyneuropathy and renal insuff.   Therapy: insulin since 2012.  GDM: never DKA: never Severe hypoglycemia: never.   Pancreatitis: never.   Other: she declines multiple daily injections; she uses pen, due to visual problems; on levemir, she had am hypoglycemia and pm hyperglycemia, so she was changed to QAM NPH.   Interval history: no recent steroids. no cbg record, but states cbg's vary from 56-300.  It is in general higher as the day goes on.  pt states she feels well in general, except for shoulder pain, for which she takes ibuprofen.  She takes NPH, 80 units QAM.   Past Medical History:  Diagnosis Date  . Acute bronchitis   . Anemia of other chronic disease   . Anxiety state, unspecified   . Chest pain, unspecified   . Diverticulosis of colon (without mention of hemorrhage)   . Esophageal reflux   . Gallstones   . Generalized osteoarthrosis, unspecified site   . Headache(784.0)   . Lumbago   . Neuropathy in diabetes ([place])    bilat LE's  . Osteoporosis   . Other and unspecified hyperlipidemia   . Other B-complex deficiencies   . Type II or unspecified type diabetes mellitus without mention of complication, not stated as uncontrolled   . Unspecified disorder resulting from impaired renal function   . Unspecified essential hypertension   . Unspecified hypothyroidism     Past Surgical History:  Procedure Laterality Date  . APPENDECTOMY    . THYROIDECTOMY      Social History   Social History  . Marital status: Divorced    Spouse name: N/A  . Number of children: 6  . Years of education: N/A   Occupational History  . retired    Social History Main Topics  . Smoking status: Former Smoker    Packs/day: 0.50    Years: 6.00    Types: Cigarettes    Quit  date: 02/10/1973  . Smokeless tobacco: Former Systems developer    Types: Snuff    Quit date: 04/07/2010  . Alcohol use No  . Drug use: No  . Sexual activity: No   Other Topics Concern  . Not on file   Social History Narrative   1 child passed away at 22 weeks old---?crib death   Dtr; [name]; Son lives with her [name]; [name]; roger;        Current Outpatient Prescriptions on File Prior to Visit  Medication Sig Dispense Refill  . albuterol (PROVENTIL HFA;VENTOLIN HFA) 108 (90 BASE) MCG/ACT inhaler Inhale 2 puffs into the lungs every 6 (six) hours as needed for wheezing or shortness of breath. 1 Inhaler 6  . amLODipine (NORVASC) 5 MG tablet Take 2 tablets (10 mg total) by mouth daily. 60 tablet 0  . aspirin 81 MG tablet Take 81 mg by mouth daily.      . Cholecalciferol (VITAMIN D3) 2000 UNITS TABS Take 2,000 Units by mouth 2 (two) times daily.     . cyanocobalamin (,VITAMIN B-12,) 1000 MCG/ML injection INJECT 1ML INTO THE MUSCLE ONCE A MONTH (Patient taking differently: INJECT 100 MCG INTO THE MUSCLE ONCE A MONTH) 1 mL 11  . fenofibrate 160 MG tablet TAKE 1 TABLET BY MOUTH DAILY 30 tablet 5  .  ferrous sulfate 325 (65 FE) MG EC tablet Take 325 mg by mouth 2 (two) times daily.     . fluticasone (FLONASE) 50 MCG/ACT nasal spray Place 1 spray into both nostrils daily. 16 g 0  . furosemide (LASIX) 20 MG tablet TAKE ONE TABLET BY MOUTH EVERY DAY AS NEEDED FOR FLUID OR EDEMA (WEIGHT GAIN OF MORE THAN 3LBS IN ONE DAY OR 5LBS IN TWO DAYS) 30 tablet 3  . GLOBAL EASE INJECT PEN NEEDLES 32G X 4 MM MISC USE AS DIRECTED WITH THE HUMULIN PEN INSULIN (100 DAY SUPPLY) 100 each 2  . guaiFENesin (MUCINEX) 600 MG 12 hr tablet Take 1 tablet (600 mg total) by mouth 2 (two) times daily. 30 tablet 0  . Lancets (ONETOUCH ULTRASOFT) lancets Use as instructed 100 each 6  . latanoprost (XALATAN) 0.005 % ophthalmic solution Place 1 drop into both eyes at bedtime.  11  . levothyroxine (SYNTHROID, LEVOTHROID) 175 MCG  tablet TAKE 1 TABLET BY MOUTH EVERY DAY BEFORE BREAKFAST (Patient taking differently: TAKE 175 MCG BY MOUTH EVERY DAY BEFORE BREAKFAST) 90 tablet 3  . NEEDLE, DISP, 25 G (BD DISP NEEDLES) 25G X 5/8" MISC Use as directed to administer  the b12 injection monthly 50 each 2  . ONE TOUCH ULTRA TEST test strip USE TO test blood sugar FOUR TIMES DAILY 120 each 11  . polyethylene glycol (MIRALAX / GLYCOLAX) packet Take 17 g by mouth daily. 14 each 0  . rosuvastatin (CRESTOR) 20 MG tablet TAKE ONE TABLET BY MOUTH DAILY 30 tablet 3  . saccharomyces boulardii (FLORASTOR) 250 MG capsule Take 1 capsule (250 mg total) by mouth 2 (two) times daily. 60 capsule 0  . [DISCONTINUED] insulin glargine (LANTUS SOLOSTAR) 100 UNIT/ML injection Inject 100 Units into the skin every morning. 45 mL PRN   No current facility-administered medications on file prior to visit.     Allergies  Allergen Reactions  . Codeine Other (See Comments)    REACTION: change in mental status  . Diphenhydramine Hcl Itching  . Quinine Other (See Comments)    REACTION: abnormal sensations in face  . Sulfonamide Derivatives Rash    Family History  Problem Relation Age of Onset  . Diabetes Mother   . Cancer Father        unsure ? stomach  . Heart attack Brother        x 3  . Cancer Sister        unsure type  . Lung cancer Brother        smoker  . Heart attack Sister     BP (!) 130/58   Pulse (!) 106   Wt 164 lb 9.6 oz (74.7 kg)   SpO2 95%   BMI 26.57 kg/m    Review of Systems Denies LOC.     Objective:   Physical Exam VITAL SIGNS:  See vs page.  GENERAL: no distress.  Pulses: foot pulses are intact bilaterally.   MSK: no deformity of the feet or ankles.  CV: no edema of the legs, but there are bilat vv's.  Skin:  no ulcer on the feet or ankles.  normal color and temp on the feet and ankles Neuro: sensation is intact to touch on the feet and ankles.   Ext: There is bilateral onychomycosis of the toenails  Lab  Results  Component Value Date   CREATININE 1.50 (H) 08/29/2016   BUN 36 (H) 08/29/2016   NA 139 08/29/2016   K 4.0 08/29/2016  CL 108 08/29/2016   CO2 23 08/29/2016    Lab Results  Component Value Date   HGBA1C 6.4 12/04/2016      Assessment & Plan:  Insulin-requiring type 2 DM, with polyneuropathy: overcontrolled, given this regimen, which does match insulin to her changing needs throughout the day. Renal insuff: in this setting, she may need to change to 70/30.  This is supported by the pattern of her cbg's.   Frail elderly state: she is not a candidate for aggressive glycemic control.   Patient Instructions  check your blood sugar twice a day.  vary the time of day when you check, between before the 3 meals, and at bedtime.  also check if you have symptoms of your blood sugar being too high or too low.  please keep a record of the readings and bring it to your next appointment here.  You can write it on any piece of paper.  please call us sooner if your blood sugar goes below 70, or if you have a lot of readings over 200.  Please reduce the insulin to 60 units each morning. On this type of insulin schedule, you should eat meals on a regular schedule.  If a meal is missed or significantly delayed, your blood sugar could go low.   Please avoid the ibuprofen, as it is not good for your kidneys.  Please come back for a follow-up appointment in 3 months.

## 2016-12-04 NOTE — Patient Instructions (Addendum)
check your blood sugar twice a day.  vary the time of day when you check, between before the 3 meals, and at bedtime.  also check if you have symptoms of your blood sugar being too high or too low.  please keep a record of the readings and bring it to your next appointment here.  You can write it on any piece of paper.  please call us sooner if your blood sugar goes below 70, or if you have a lot of readings over 200.  Please reduce the insulin to 60 units each morning. On this type of insulin schedule, you should eat meals on a regular schedule.  If a meal is missed or significantly delayed, your blood sugar could go low.   Please avoid the ibuprofen, as it is not good for your kidneys.  Please come back for a follow-up appointment in 3 months.

## 2016-12-29 ENCOUNTER — Telehealth: Payer: Self-pay | Admitting: Endocrinology

## 2016-12-29 ENCOUNTER — Other Ambulatory Visit: Payer: Self-pay

## 2016-12-29 MED ORDER — GLUCOSE BLOOD VI STRP: ORAL_STRIP | 11 refills | Status: DC

## 2016-12-29 NOTE — Telephone Encounter (Signed)
Test strips sent to phramacy.

## 2016-12-29 NOTE — Telephone Encounter (Signed)
Patients needs prescription for test strips sent to pharmacy: [facility], per her daughter [name]. Patient is out of test strips Patient told daughter her sugar is running high ([name] did not know how high)

## 2017-02-02 ENCOUNTER — Other Ambulatory Visit: Payer: Self-pay | Admitting: Internal Medicine

## 2017-02-12 DIAGNOSIS — H401131 Primary open-angle glaucoma, bilateral, mild stage: Secondary | ICD-10-CM | POA: Diagnosis not present

## 2017-02-12 DIAGNOSIS — E119 Type 2 diabetes mellitus without complications: Secondary | ICD-10-CM | POA: Diagnosis not present

## 2017-02-14 ENCOUNTER — Ambulatory Visit (INDEPENDENT_AMBULATORY_CARE_PROVIDER_SITE_OTHER): Payer: Medicare Other | Admitting: Family

## 2017-02-14 ENCOUNTER — Encounter: Payer: Self-pay | Admitting: Family

## 2017-02-14 VITALS — BP 124/54 | HR 96 | Temp 100.2°F | Ht 66.0 in | Wt 161.0 lb

## 2017-02-14 DIAGNOSIS — J181 Lobar pneumonia, unspecified organism: Secondary | ICD-10-CM

## 2017-02-14 DIAGNOSIS — J189 Pneumonia, unspecified organism: Secondary | ICD-10-CM

## 2017-02-14 MED ORDER — DOXYCYCLINE HYCLATE 100 MG PO TABS: 100.0000 mg | ORAL_TABLET | Freq: Two times a day (BID) | ORAL | 0 refills | Status: DC

## 2017-02-14 NOTE — Progress Notes (Signed)
Jocelyn Mendez is a 84 y.o. female with the following history as recorded in Epic[employer]:  Patient Active Problem List   Diagnosis Date Noted  . Swelling 10/29/2016  . Acute bronchitis 08/28/2016  . Acute hypoxemic respiratory failure ([place]) 08/28/2016  . Fever 08/27/2016  . Acute diastolic (congestive) heart failure ([place]) 08/27/2016  . Diarrhea 08/27/2016  . Asthma, chronic, unspecified asthma severity, with acute exacerbation 08/27/2016  . Encephalopathy 08/24/2016  . Acute encephalopathy 08/23/2016  . RLQ abdominal pain 04/17/2015  . Diabetes ([place]) 03/28/2015  . AB (asthmatic bronchitis) 05/18/2013  . Acute respiratory failure with hypoxia ([place]) 03/07/2013  . Gait abnormality 05/13/2010  . B12 deficiency 11/23/2007  . ANEMIA OF CHRONIC DISEASE 01/21/2007  . CKD (chronic kidney disease) stage 3, GFR 30-59 ml/min ([facility]) 01/21/2007  . Hypothyroidism 11/21/2006  . Hyperlipidemia 11/21/2006  . Essential hypertension 11/21/2006  . GERD 11/21/2006  . DEGENERATIVE JOINT DISEASE, GENERALIZED 11/21/2006  . Osteoporosis 11/21/2006    Current Outpatient Medications  Medication Sig Dispense Refill  . albuterol (PROVENTIL HFA;VENTOLIN HFA) 108 (90 BASE) MCG/ACT inhaler Inhale 2 puffs into the lungs every 6 (six) hours as needed for wheezing or shortness of breath. 1 Inhaler 6  . amLODipine (NORVASC) 5 MG tablet Take 2 tablets (10 mg total) by mouth daily. 60 tablet 0  . amLODipine (NORVASC) 5 MG tablet TAKE ONE TABLET BY MOUTH EVERY DAY EMERGENCY REFILL FAXED DR. 90 tablet 0  . aspirin 81 MG tablet Take 81 mg by mouth daily.      . Cholecalciferol (VITAMIN D3) 2000 UNITS TABS Take 2,000 Units by mouth 2 (two) times daily.     . cyanocobalamin (,VITAMIN B-12,) 1000 MCG/ML injection INJECT 1ML INTO THE MUSCLE ONCE A MONTH (Patient taking differently: INJECT 100 MCG INTO THE MUSCLE ONCE A MONTH) 1 mL 11  . fenofibrate 160 MG tablet TAKE 1 TABLET BY MOUTH DAILY 30 tablet 5  . ferrous sulfate 325  (65 FE) MG EC tablet Take 325 mg by mouth 2 (two) times daily.     Marland Kitchen GLOBAL EASE INJECT PEN NEEDLES 32G X 4 MM MISC USE AS DIRECTED WITH THE HUMULIN PEN INSULIN (100 DAY SUPPLY) 100 each 2  . glucose blood (ONE TOUCH ULTRA TEST) test strip USE TO test blood sugar FOUR TIMES DAILY 120 each 11  . guaiFENesin (MUCINEX) 600 MG 12 hr tablet Take 1 tablet (600 mg total) by mouth 2 (two) times daily. 30 tablet 0  . Insulin NPH, Human,, Isophane, (HUMULIN N) 100 UNIT/ML Kiwkpen Inject 60 Units into the skin 2 (two) times daily before a meal. 45 mL 0  . Lancets (ONETOUCH ULTRASOFT) lancets Use as instructed 100 each 6  . latanoprost (XALATAN) 0.005 % ophthalmic solution Place 1 drop into both eyes at bedtime.  11  . levothyroxine (SYNTHROID, LEVOTHROID) 175 MCG tablet TAKE 1 TABLET BY MOUTH EVERY DAY BEFORE BREAKFAST (Patient taking differently: TAKE 175 MCG BY MOUTH EVERY DAY BEFORE BREAKFAST) 90 tablet 3  . NEEDLE, DISP, 25 G (BD DISP NEEDLES) 25G X 5/8" MISC Use as directed to administer  the b12 injection monthly 50 each 2  . polyethylene glycol (MIRALAX / GLYCOLAX) packet Take 17 g by mouth daily. 14 each 0  . rosuvastatin (CRESTOR) 20 MG tablet TAKE ONE TABLET BY MOUTH DAILY 30 tablet 3  . saccharomyces boulardii (FLORASTOR) 250 MG capsule Take 1 capsule (250 mg total) by mouth 2 (two) times daily. 60 capsule 0  . doxycycline (VIBRA-TABS) 100 MG  tablet Take 1 tablet (100 mg total) by mouth 2 (two) times daily. 20 tablet 0  . furosemide (LASIX) 20 MG tablet TAKE ONE TABLET BY MOUTH EVERY DAY AS NEEDED FOR FLUID OR EDEMA (WEIGHT GAIN OF MORE THAN 3LBS IN ONE DAY OR 5LBS IN TWO DAYS) (Patient not taking: Reported on 02/14/2017) 30 tablet 3   No current facility-administered medications for this visit.     Allergies: Codeine; Diphenhydramine hcl; Quinine; and Sulfonamide derivatives  Past Medical History:  Diagnosis Date  . Acute bronchitis   . Anemia of other chronic disease   . Anxiety state,  unspecified   . Chest pain, unspecified   . Diverticulosis of colon (without mention of hemorrhage)   . Esophageal reflux   . Gallstones   . Generalized osteoarthrosis, unspecified site   . Headache(784.0)   . Lumbago   . Neuropathy in diabetes ([place])    bilat LE's  . Osteoporosis   . Other and unspecified hyperlipidemia   . Other B-complex deficiencies   . Type II or unspecified type diabetes mellitus without mention of complication, not stated as uncontrolled   . Unspecified disorder resulting from impaired renal function   . Unspecified essential hypertension   . Unspecified hypothyroidism     Past Surgical History:  Procedure Laterality Date  . APPENDECTOMY    . THYROIDECTOMY      Family History  Problem Relation Age of Onset  . Diabetes Mother   . Cancer Father        unsure ? stomach  . Heart attack Brother        x 3  . Cancer Sister        unsure type  . Lung cancer Brother        smoker  . Heart attack Sister     Social History   Tobacco Use  . Smoking status: Former Smoker    Packs/day: 0.50    Years: 6.00    Pack years: 3.00    Types: Cigarettes    Last attempt to quit: 02/10/1973    Years since quitting: 44.0  . Smokeless tobacco: Former Systems developer    Types: Snuff    Quit date: 04/07/2010  Substance Use Topics  . Alcohol use: No    Alcohol/week: 0.0 oz    Subjective:  Patient is accompanied by her daughter today who helps provide history; patient started suddenly 2-3 days ago with "bad cough and fever." +productive cough; "just feel really bad." Using OTC Tylenol cough/ cold; does have history of pneumonia; former smoker; does have albuterol inhaler at home but has not been using; notes that blood sugars are "fluctuating"- has been as high as 289; admits stress level has been very high as brother in hospice; not eating like she should but is still drinking enough water; patient has not had any Tylenol this am to treat fever;   Objective:  Vitals:   02/14/17  0941  BP: (!) 124/54  Pulse: 96  Temp: 100.2 F (37.9 C)  TempSrc: Oral  SpO2: 92%  Weight: 161 lb (73 kg)  Height: 5\' 6"  (1.676 m)    General: Well developed, well nourished, in no acute distress; appears weak, tired;  Skin : Warm and dry.  Head: Normocephalic and atraumatic  Eyes: Sclera and conjunctiva clear; pupils round and reactive to light; extraocular movements intact  Ears: External normal; canals clear; tympanic membranes normal  Oropharynx: Pink, supple. No suspicious lesions  Neck: Supple without thyromegaly, adenopathy  Lungs:  Wheezing noted; concern for decreased sounds in lower lobes CVS exam: normal Neurologic: Alert and oriented; speech intact; face symmetrical; moves all extremities well; CNII-XII intact without focal deficit   Assessment:  1. Pneumonia of both lower lobes due to infectious organism [facility])     Plan:  Discussed my concern with patient and daughter about pneumonia and patient's appearance; am unable to get CXR or labs today due to Saturday clinic; recommended ER evaluation- may need admission; patient adamantly defers and daughter agrees to take patient if she worsens over the next 24 hours; will start Doxycycline 100 mg bid x 10 days and recommend albuterol q 4-6 hours; increase fluids, rest; see PCP on Monday; strict ER precautions discussed for upcoming weekend.   Return in about 2 days (around 02/16/2017) for with Dr. Sharlet Salina.  No orders of the defined types were placed in this encounter.   Requested Prescriptions   Signed Prescriptions Disp Refills  . doxycycline (VIBRA-TABS) 100 MG tablet 20 tablet 0    Sig: Take 1 tablet (100 mg total) by mouth 2 (two) times daily.

## 2017-02-17 ENCOUNTER — Ambulatory Visit (INDEPENDENT_AMBULATORY_CARE_PROVIDER_SITE_OTHER): Payer: Medicare Other | Admitting: Internal Medicine

## 2017-02-17 ENCOUNTER — Encounter: Payer: Self-pay | Admitting: Internal Medicine

## 2017-02-17 DIAGNOSIS — J209 Acute bronchitis, unspecified: Secondary | ICD-10-CM | POA: Diagnosis not present

## 2017-02-17 DIAGNOSIS — J45901 Unspecified asthma with (acute) exacerbation: Secondary | ICD-10-CM | POA: Diagnosis not present

## 2017-02-17 NOTE — Patient Instructions (Signed)
We have given you a sample of breo which is an inhaler. Use 1 puff daily for 2 weeks (until gone). You should use this every day even if you feel good.  It is okay to use the albuterol inhaler as needed along with this.   We want you to call us back in about 1 week and let us know if you are feeling better or not and we may need to make changes then.  If you start feeling worse before then come back or go to the ER if you are having problems breathing.

## 2017-02-17 NOTE — Progress Notes (Addendum)
   Subjective:    Patient ID: Jocelyn Mendez, female    DOB: 01-Apr-1930, 84 y.o.   MRN: [id number]  HPI The patient is an 84 YO female coming in for follow up of pneumonia diagnosis. No x-ray done as last visit was weekend and not available. She was started on antibiotics with doxycycline. She is using her albuterol inhaler several times per day still. Having some SOB and coughing still. She is using otc medications for cough. Denies fevers or chills. Overall she is improving in the last 1-2 days since starting the antibiotic.   Review of Systems  Constitutional: Positive for activity change. Negative for appetite change, chills, fatigue, fever and unexpected weight change.  HENT: Positive for congestion. Negative for ear discharge, ear pain, postnasal drip, rhinorrhea, sinus pressure, sinus pain, sneezing, sore throat, tinnitus, trouble swallowing and voice change.   Eyes: Negative.   Respiratory: Positive for cough and shortness of breath. Negative for chest tightness and wheezing.   Cardiovascular: Negative.   Gastrointestinal: Negative.   Musculoskeletal: Positive for arthralgias.  Neurological: Negative.       Objective:   Physical Exam  Constitutional: She is oriented to person, place, and time. She appears well-developed and well-nourished.  HENT:  Head: Normocephalic and atraumatic.  Oropharynx with redness and clear drainage.  Eyes: EOM are normal.  Neck: Normal range of motion.  Cardiovascular: Normal rate and regular rhythm.  Pulmonary/Chest: Effort normal. No respiratory distress. She has wheezes. She has no rales.  Abdominal: Soft.  Neurological: She is alert and oriented to person, place, and time. Coordination normal.  Skin: Skin is warm and dry.   Vitals:   02/17/17 1030  BP: 112/60  Pulse: 82  Temp: 98.5 F (36.9 C)  TempSrc: Oral  SpO2: 99%  Weight: 160 lb (72.6 kg)  Height: 5\' 6"  (1.676 m)      Assessment & Plan:  Sample of breo given at visit

## 2017-02-20 NOTE — Assessment & Plan Note (Signed)
Given the changes on exam will give sample of breo to help during this infection. She does not typically need all the time so likely will be temporary medication. Continue albuterol as needed. Continue doxycycline until complete.

## 2017-02-20 NOTE — Assessment & Plan Note (Signed)
Breo short term along with albuterol and finish doxycycline.

## 2017-02-22 ENCOUNTER — Other Ambulatory Visit: Payer: Self-pay | Admitting: Internal Medicine

## 2017-03-02 ENCOUNTER — Other Ambulatory Visit: Payer: Self-pay | Admitting: Internal Medicine

## 2017-03-02 ENCOUNTER — Telehealth: Payer: Self-pay | Admitting: Internal Medicine

## 2017-03-02 DIAGNOSIS — R05 Cough: Secondary | ICD-10-CM

## 2017-03-02 DIAGNOSIS — R059 Cough, unspecified: Secondary | ICD-10-CM

## 2017-03-02 NOTE — Telephone Encounter (Signed)
Duplicate messge

## 2017-03-02 NOTE — Telephone Encounter (Signed)
Copied from [place]. Topic: Quick Communication - See Telephone Encounter >> Mar 02, 2017  9:42 AM Ahmed Prima L wrote: CRM for notification. See Telephone encounter for:   03/02/17.  Pt's daughter called and said she is still sick. She said she has been seen twice. Once on the 5th and the other on the 8th. She is requesting another antibiotic.   [facility] - [address], [place], [place]  Call back is (if needed) [phone]

## 2017-03-02 NOTE — Telephone Encounter (Signed)
Routed message back to Dr. Sharlet Salina. Waiting response.

## 2017-03-02 NOTE — Telephone Encounter (Signed)
She needs to come for a chest x-ray since she is not feeling better. Order placed.

## 2017-03-02 NOTE — Telephone Encounter (Signed)
Can take allegra or claritin or zyrtec over the counter. This is an improvement from our visit as she was having significant lung symptoms and breathing problems at that time. Does not need chest x-ray.

## 2017-03-03 NOTE — Telephone Encounter (Signed)
I spoke with patient's daughter today and info given. She was given names of meds, wrote them down and was told to pick one OTC to try. She stated they also had apt on Thursday to f/u as well.

## 2017-03-04 ENCOUNTER — Other Ambulatory Visit: Payer: Self-pay | Admitting: Endocrinology

## 2017-03-05 ENCOUNTER — Ambulatory Visit (INDEPENDENT_AMBULATORY_CARE_PROVIDER_SITE_OTHER)
Admission: RE | Admit: 2017-03-05 | Discharge: 2017-03-05 | Disposition: A | Discharge status: Home / Self Care | Payer: Medicare Other | Source: Ambulatory Visit | Attending: Internal Medicine | Admitting: Internal Medicine

## 2017-03-05 ENCOUNTER — Ambulatory Visit (INDEPENDENT_AMBULATORY_CARE_PROVIDER_SITE_OTHER): Payer: Medicare Other | Admitting: Internal Medicine

## 2017-03-05 ENCOUNTER — Encounter: Payer: Self-pay | Admitting: Internal Medicine

## 2017-03-05 ENCOUNTER — Other Ambulatory Visit (INDEPENDENT_AMBULATORY_CARE_PROVIDER_SITE_OTHER): Payer: Medicare Other

## 2017-03-05 VITALS — BP 110/60 | HR 82 | Temp 98.8°F | Ht 66.0 in | Wt 159.0 lb

## 2017-03-05 DIAGNOSIS — E039 Hypothyroidism, unspecified: Secondary | ICD-10-CM | POA: Diagnosis not present

## 2017-03-05 DIAGNOSIS — Z0000 Encounter for general adult medical examination without abnormal findings: Secondary | ICD-10-CM

## 2017-03-05 DIAGNOSIS — R059 Cough, unspecified: Secondary | ICD-10-CM

## 2017-03-05 DIAGNOSIS — R05 Cough: Secondary | ICD-10-CM | POA: Diagnosis not present

## 2017-03-05 DIAGNOSIS — E538 Deficiency of other specified B group vitamins: Secondary | ICD-10-CM

## 2017-03-05 DIAGNOSIS — J209 Acute bronchitis, unspecified: Secondary | ICD-10-CM | POA: Diagnosis not present

## 2017-03-05 DIAGNOSIS — R0602 Shortness of breath: Secondary | ICD-10-CM | POA: Diagnosis not present

## 2017-03-05 LAB — CBC
HEMATOCRIT: 39.3 % (ref 36.0–46.0)
HEMOGLOBIN: 12.8 g/dL (ref 12.0–15.0)
MCHC: 32.6 g/dL (ref 30.0–36.0)
MCV: 92.3 fl (ref 78.0–100.0)
PLATELETS: 242 10*3/uL (ref 150.0–400.0)
RBC: 4.26 Mil/uL (ref 3.87–5.11)
RDW: 14 % (ref 11.5–15.5)
WBC: 8.1 10*3/uL (ref 4.0–10.5)

## 2017-03-05 LAB — COMPREHENSIVE METABOLIC PANEL
ALBUMIN: 4.2 g/dL (ref 3.5–5.2)
ALT: 8 U/L (ref 0–35)
AST: 15 U/L (ref 0–37)
Alkaline Phosphatase: 62 U/L (ref 39–117)
BUN: 36 mg/dL — ABNORMAL HIGH (ref 6–23)
CALCIUM: 9.1 mg/dL (ref 8.4–10.5)
CHLORIDE: 110 meq/L (ref 96–112)
CO2: 23 mEq/L (ref 19–32)
CREATININE: 1.77 mg/dL — AB (ref 0.40–1.20)
GFR: 29.03 mL/min — AB (ref >60.00)
Glucose, Bld: 149 mg/dL — ABNORMAL HIGH (ref 70–99)
POTASSIUM: 4.9 meq/L (ref 3.5–5.1)
Sodium: 139 mEq/L (ref 135–145)
Total Bilirubin: 0.4 mg/dL (ref 0.2–1.2)
Total Protein: 7.5 g/dL (ref 6.0–8.3)

## 2017-03-05 LAB — LIPID PANEL
CHOL/HDL RATIO: 4
CHOLESTEROL: 127 mg/dL (ref 0–200)
HDL: 31.3 mg/dL — ABNORMAL LOW (ref >39.00)
NonHDL: 95.26
TRIGLYCERIDES: 219 mg/dL — AB (ref 0.0–149.0)
VLDL: 43.8 mg/dL — AB (ref 0.0–40.0)

## 2017-03-05 LAB — T4, FREE: Free T4: 1 ng/dL (ref 0.60–1.60)

## 2017-03-05 LAB — TSH: TSH: 11.5 u[IU]/mL — ABNORMAL HIGH (ref 0.35–4.50)

## 2017-03-05 LAB — LDL CHOLESTEROL, DIRECT: Direct LDL: 68 mg/dL

## 2017-03-05 MED ORDER — CYANOCOBALAMIN 1000 MCG/ML IJ SOLN
1000.0000 ug | Freq: Once | INTRAMUSCULAR | Status: AC
  Administered 2017-03-05: 1000 ug via INTRAMUSCULAR

## 2017-03-05 NOTE — Progress Notes (Signed)
   Subjective:    Patient ID: Jocelyn Mendez, female    DOB: 1930/12/11, 84 y.o.   MRN: [id number]  HPI The patient is an 84 YO female coming in for follow up of her breathing. She tried the breo sample and did not feel that it helped significantly. She is using albuterol but less often now. She is done with doxycycline and feels that her breathing is improving some. She does have SOB with activity still. Still having a cough which is productive. Denies fevers or chills. Does have significant nose drainage and mild sore throat. They did not get allegra as advised and did not start taking it. They have tussin at home but did not try that either due to concerns about her sugars.   Review of Systems  Constitutional: Positive for activity change. Negative for appetite change, chills, fatigue, fever and unexpected weight change.  HENT: Positive for congestion, postnasal drip and sore throat. Negative for ear discharge, ear pain, rhinorrhea, sinus pressure, sinus pain, sneezing, tinnitus, trouble swallowing and voice change.   Eyes: Negative.   Respiratory: Positive for cough and shortness of breath. Negative for chest tightness and wheezing.   Cardiovascular: Negative.   Gastrointestinal: Negative.   Musculoskeletal: Negative for myalgias.  Neurological: Negative.       Objective:   Physical Exam  Constitutional: She is oriented to person, place, and time. She appears well-developed and well-nourished.  HENT:  Head: Normocephalic and atraumatic.  Oropharynx with redness and clear drainage, nose with swollen turbinates, TMs normal bilaterally, no sinus tenderness  Eyes: EOM are normal.  Neck: Normal range of motion. No thyromegaly present.  Cardiovascular: Normal rate and regular rhythm.  Pulmonary/Chest: Effort normal and breath sounds normal. No respiratory distress. She has no wheezes. She has no rales.  Lung exam improved from prior  Abdominal: Soft.  Musculoskeletal: She exhibits  tenderness.  Lymphadenopathy:    She has no cervical adenopathy.  Neurological: She is alert and oriented to person, place, and time.  Skin: Skin is warm and dry.   Vitals:   03/05/17 1254  BP: 110/60  Pulse: 82  Temp: 98.8 F (37.1 C)  TempSrc: Oral  SpO2: 98%  Weight: 159 lb (72.1 kg)  Height: 5\' 6"  (1.676 m)      Assessment & Plan:  B12 given at visit

## 2017-03-05 NOTE — Progress Notes (Signed)
Medical screening examination/treatment/procedure(s) were performed by non-physician practitioner and as supervising physician I was immediately available for consultation/collaboration. I agree with above. Benson Porcaro A Rebel Laughridge, MD 

## 2017-03-05 NOTE — Patient Instructions (Addendum)
We will check the labs today and the chest x-ray.   Get the allegra over the counter for the drainage and this will help the cough.  We will call you back about the x-ray results.   It is okay to take the tussin for the cough.   Continue doing brain stimulating activities (puzzles, reading, adult coloring books, staying active) to keep memory sharp.   www.auntbertha.com or down load app on smart phone  Jocelyn Mendez website lists multiple social resources for individuals such as: food, health, money, house hold goods, transit, medical supplies, job training and legal services.  Continue to eat heart healthy diet (full of fruits, vegetables, whole grains, lean protein, water--limit salt, fat, and sugar intake) and increase physical activity as tolerated.   Jocelyn Mendez , Thank you for taking time to come for your Medicare Wellness Visit. I appreciate your ongoing commitment to your health goals. Please review the following plan we discussed and let me know if I can assist you in the future.   These are the goals we discussed: Goals      Patient Stated   . stay independent (pt-stated)     Maintains exercise; walks to dtr'; do chair exercises on porch almost every day;  Each exercise x 3 minutes; legs and arms; Does the twist x 5 min       Other   . Patient Stated     I want to get my strength back and maintain my home. Continue to be a little more active until I am back to baseline.       This is a list of the screening recommended for you and due dates:  Health Maintenance  Topic Date Due  . Tetanus Vaccine  12/08/1949  . Urine Protein Check  09/21/2014  . DEXA scan (bone density measurement)  03/05/2018*  . Hemoglobin A1C  06/04/2017  . Complete foot exam   08/04/2017  . Eye exam for diabetics  01/10/2018  . Flu Shot  Completed  . Pneumonia vaccines  Completed  *Topic was postponed. The date shown is not the original due date.

## 2017-03-05 NOTE — Assessment & Plan Note (Signed)
B12 shot given at visit.

## 2017-03-05 NOTE — Assessment & Plan Note (Signed)
Getting chest x-ray for ongoing cough. Lung exam is improved from prior. Advised to start taking tussin and allegra to help with her drainage. Can use albuterol prn.

## 2017-03-05 NOTE — Progress Notes (Signed)
Subjective:   Jocelyn Mendez is a 84 y.o. female who presents for Medicare Annual (Subsequent) preventive examination.  Review of Systems:  No ROS.  Medicare Wellness Visit. Additional risk factors are reflected in the social history.  Cardiac Risk Factors include: advanced age (>47men, >61 women);diabetes mellitus;dyslipidemia;hypertension Sleep patterns: gets up 1-2 times nightly to void and sleeps 6-7 hours nightly.   Home Safety/Smoke Alarms: Feels safe in home. Smoke alarms in place.   Living environment; residence and Firearm Safety: 1-story house/ trailer, no firearms. Lives alone, no needs for DME, good support system Seat Belt Safety/Bike Helmet: Wears seat belt.     Objective:     Vitals: BP 110/60 (BP Location: Left Arm, Patient Position: Sitting, Cuff Size: Normal)   Pulse 82   Temp 98.8 F (37.1 C) (Oral)   Ht 5\' 6"  (1.676 m)   Wt 159 lb (72.1 kg)   SpO2 98%   BMI 25.66 kg/m   Body mass index is 25.66 kg/m.  Advanced Directives 03/05/2017 08/27/2016 08/23/2016 07/04/2015 10/05/2014 03/02/2013  Does Patient Have a Medical Advance Directive? Yes No No No Yes Patient does not have advance directive;Patient would not like information  Type of Scientist, forensic Power of [place];Living will - - - - -  Copy of [place] in Chart? No - copy requested - - - Yes -  Would patient like information on creating a medical advance directive? - No - Patient declined No - Patient declined No - patient declined information - -  Pre-existing out of facility DNR order (yellow form or pink MOST form) - - - - - No    Tobacco Social History   Tobacco Use  Smoking Status Former Smoker  . Packs/day: 0.50  . Years: 6.00  . Pack years: 3.00  . Types: Cigarettes  . Last attempt to quit: 02/10/1973  . Years since quitting: 44.0  Smokeless Tobacco Former Systems developer  . Types: Snuff  . Quit date: 04/07/2010     Counseling given: Not Answered   Past Medical  History:  Diagnosis Date  . Acute bronchitis   . Anemia of other chronic disease   . Anxiety state, unspecified   . Chest pain, unspecified   . Diverticulosis of colon (without mention of hemorrhage)   . Esophageal reflux   . Gallstones   . Generalized osteoarthrosis, unspecified site   . Headache(784.0)   . Lumbago   . Neuropathy in diabetes ([place])    bilat LE's  . Osteoporosis   . Other and unspecified hyperlipidemia   . Other B-complex deficiencies   . Type II or unspecified type diabetes mellitus without mention of complication, not stated as uncontrolled   . Unspecified disorder resulting from impaired renal function   . Unspecified essential hypertension   . Unspecified hypothyroidism    Past Surgical History:  Procedure Laterality Date  . APPENDECTOMY    . THYROIDECTOMY     Family History  Problem Relation Age of Onset  . Diabetes Mother   . Cancer Father        unsure ? stomach  . Heart attack Brother        x 3  . Cancer Sister        unsure type  . Lung cancer Brother        smoker  . Heart attack Sister    Social History   Socioeconomic History  . Marital status: Divorced    Spouse name: None  .  Number of children: 6  . Years of education: None  . Highest education level: None  Social Needs  . Financial resource strain: Somewhat hard  . Food insecurity - worry: Sometimes true  . Food insecurity - inability: Sometimes true  . Transportation needs - medical: No  . Transportation needs - non-medical: No  Occupational History  . Occupation: retired  Tobacco Use  . Smoking status: Former Smoker    Packs/day: 0.50    Years: 6.00    Pack years: 3.00    Types: Cigarettes    Last attempt to quit: 02/10/1973    Years since quitting: 44.0  . Smokeless tobacco: Former Systems developer    Types: Snuff    Quit date: 04/07/2010  Substance and Sexual Activity  . Alcohol use: No    Alcohol/week: 0.0 oz  . Drug use: No  . Sexual activity: No  Other Topics Concern  .  None  Social History Narrative   1 child passed away at 70 weeks old---?crib death   Dtr; [name]; Son lives with her Jocelyn Mendez; [name]; roger;     Outpatient Encounter Medications as of 03/05/2017  Medication Sig  . albuterol (PROVENTIL HFA;VENTOLIN HFA) 108 (90 BASE) MCG/ACT inhaler Inhale 2 puffs into the lungs every 6 (six) hours as needed for wheezing or shortness of breath.  Marland Kitchen amLODipine (NORVASC) 5 MG tablet Take 2 tablets (10 mg total) by mouth daily.  Marland Kitchen amLODipine (NORVASC) 5 MG tablet TAKE ONE TABLET BY MOUTH EVERY DAY EMERGENCY REFILL FAXED DR.  Marland Kitchen aspirin 81 MG tablet Take 81 mg by mouth daily.    . Cholecalciferol (VITAMIN D3) 2000 UNITS TABS Take 2,000 Units by mouth 2 (two) times daily.   . cyanocobalamin (,VITAMIN B-12,) 1000 MCG/ML injection INJECT 1ML INTO THE MUSCLE ONCE A MONTH  . fenofibrate 160 MG tablet TAKE 1 TABLET BY MOUTH DAILY  . ferrous sulfate 325 (65 FE) MG EC tablet Take 325 mg by mouth 2 (two) times daily.   . furosemide (LASIX) 20 MG tablet TAKE ONE TABLET BY MOUTH EVERY DAY AS NEEDED FOR FLUID OR EDEMA (WEIGHT GAIN OF MORE THAN 3LBS IN ONE DAY OR 5LBS IN TWO DAYS)  . GLOBAL EASE INJECT PEN NEEDLES 32G X 4 MM MISC USE AS DIRECTED WITH THE HUMULIN PEN INSULIN (100 DAY SUPPLY)  . glucose blood (ONE TOUCH ULTRA TEST) test strip USE TO test blood sugar FOUR TIMES DAILY  . guaiFENesin (MUCINEX) 600 MG 12 hr tablet Take 1 tablet (600 mg total) by mouth 2 (two) times daily.  Marland Kitchen HUMULIN N KWIKPEN 100 UNIT/ML Kiwkpen INJECT 60 UNITS INTO THE SKIN TWICE DAILY BEFORE A MEAL  . Lancets (ONETOUCH ULTRASOFT) lancets Use as instructed  . latanoprost (XALATAN) 0.005 % ophthalmic solution Place 1 drop into both eyes at bedtime.  Marland Kitchen levothyroxine (SYNTHROID, LEVOTHROID) 175 MCG tablet TAKE 1 TABLET BY MOUTH EVERY DAY BEFORE BREAKFAST (Patient taking differently: TAKE 175 MCG BY MOUTH EVERY DAY BEFORE BREAKFAST)  . NEEDLE, DISP, 25 G (BD DISP NEEDLES) 25G X 5/8" MISC Use  as directed to administer  the b12 injection monthly  . polyethylene glycol (MIRALAX / GLYCOLAX) packet Take 17 g by mouth daily.  . rosuvastatin (CRESTOR) 20 MG tablet TAKE ONE TABLET BY MOUTH DAILY  . saccharomyces boulardii (FLORASTOR) 250 MG capsule Take 1 capsule (250 mg total) by mouth 2 (two) times daily.  . [DISCONTINUED] doxycycline (VIBRA-TABS) 100 MG tablet Take 1 tablet (100 mg total) by  mouth 2 (two) times daily.  . [EXPIRED] cyanocobalamin ((VITAMIN B-12)) injection 1,000 mcg    No facility-administered encounter medications on file as of 03/05/2017.     Activities of Daily Living In your present state of health, do you have any difficulty performing the following activities: 03/05/2017 08/27/2016  Hearing? Y N  Vision? N N  Difficulty concentrating or making decisions? N N  Walking or climbing stairs? N N  Dressing or bathing? N N  Doing errands, shopping? N Y  Conservation officer, nature and eating ? N -  Using the Toilet? N -  In the past six months, have you accidently leaked urine? N -  Do you have problems with loss of bowel control? N -  Managing your Medications? N -  Managing your Finances? N -  Housekeeping or managing your Housekeeping? N -  Some recent data might be hidden    Patient Care Team: Hoyt Koch, MD as PCP - General (Internal Medicine) Renato Shin, MD as Consulting Physician (Endocrinology)    Assessment:   This is a routine wellness examination for Velena. Physical assessment deferred to PCP.   Exercise Activities and Dietary recommendations Current Exercise Habits: Home exercise routine, Type of exercise: walking;calisthenics, Time (Minutes): 30, Frequency (Times/Week): 3, Weekly Exercise (Minutes/Week): 90, Intensity: Mild, Exercise limited by: None identified Diet (meal preparation, eat out, water intake, caffeinated beverages, dairy products, fruits and vegetables): in general, a "healthy" diet  , well balanced   Reviewed heart healthy and  diabetic diet, encouraged patient to increase daily water intake.  Goals      Patient Stated   . stay independent (pt-stated)     Maintains exercise; walks to dtr'; do chair exercises on porch almost every day;  Each exercise x 3 minutes; legs and arms; Does the twist x 5 min       Other   . Patient Stated     I want to get my strength back and maintain my home. Continue to be a little more active each day until I am back to baseline.       Fall Risk Fall Risk  03/05/2017 10/29/2016 10/17/2015 10/05/2014 09/26/2013  Falls in the past year? No No No No No  Number falls in past yr: - - - - -  Injury with Fall? - - - - -    Depression Screen PHQ 2/9 Scores 03/05/2017 10/29/2016 10/17/2015 10/05/2014  PHQ - 2 Score 2 1 0 0  PHQ- 9 Score 5 - - -     Cognitive Function MMSE - Mini Mental State Exam 03/05/2017  Orientation to time 5  Orientation to Place 5  Registration 3  Attention/ Calculation 4  Recall 2  Language- name 2 objects 2  Language- repeat 1  Language- follow 3 step command 3  Language- read & follow direction 1  Write a sentence 1  Copy design 1  Total score 28        Immunization History  Administered Date(s) Administered  . Influenza Split 10/16/2011  . Influenza Whole 01/21/2007, 11/23/2007, 11/22/2008, 11/12/2009  . Influenza, High Dose Seasonal PF 10/05/2014, 10/29/2016  . Influenza,inj,Quad PF,6+ Mos 11/16/2012, 10/17/2015  . Pneumococcal Conjugate-13 10/05/2014  . Pneumococcal Polysaccharide-23 02/10/2009, 08/24/2016   Screening Tests Health Maintenance  Topic Date Due  . TETANUS/TDAP  12/08/1949  . URINE MICROALBUMIN  09/21/2014  . DEXA SCAN  03/05/2018 (Originally 01-03-1931)  . HEMOGLOBIN A1C  06/04/2017  . FOOT EXAM  08/04/2017  . OPHTHALMOLOGY EXAM  01/10/2018  . INFLUENZA VACCINE  Completed  . PNA vac Low Risk Adult  Completed      Plan:    Continue doing brain stimulating activities (puzzles, reading, adult coloring books, staying  active) to keep memory sharp.   www.auntbertha.com or down load app on smart phone  [name] website lists multiple social resources for individuals such as: food, health, money, house hold goods, transit, medical supplies, job training and legal services.  Continue to eat heart healthy diet (full of fruits, vegetables, whole grains, lean protein, water--limit salt, fat, and sugar intake) and increase physical activity as tolerated.  I have personally reviewed and noted the following in the patient's chart:   . Medical and social history . Use of alcohol, tobacco or illicit drugs  . Current medications and supplements . Functional ability and status . Nutritional status . Physical activity . Advanced directives . List of other physicians . Vitals . Screenings to include cognitive, depression, and falls . Referrals and appointments  In addition, I have reviewed and discussed with patient certain preventive protocols, quality metrics, and best practice recommendations. A written personalized care plan for preventive services as well as general preventive health recommendations were provided to patient.     Michiel Cowboy, RN  03/05/2017

## 2017-03-05 NOTE — Assessment & Plan Note (Signed)
Checking TSH and free T4 and adjust as needed.  

## 2017-03-06 ENCOUNTER — Ambulatory Visit (INDEPENDENT_AMBULATORY_CARE_PROVIDER_SITE_OTHER): Payer: Medicare Other | Admitting: Endocrinology

## 2017-03-06 ENCOUNTER — Encounter: Payer: Self-pay | Admitting: Endocrinology

## 2017-03-06 VITALS — BP 148/62 | HR 96 | Wt 164.2 lb

## 2017-03-06 DIAGNOSIS — Z794 Long term (current) use of insulin: Secondary | ICD-10-CM | POA: Diagnosis not present

## 2017-03-06 DIAGNOSIS — N183 Chronic kidney disease, stage 3 unspecified: Secondary | ICD-10-CM

## 2017-03-06 DIAGNOSIS — E1122 Type 2 diabetes mellitus with diabetic chronic kidney disease: Secondary | ICD-10-CM

## 2017-03-06 LAB — POCT GLYCOSYLATED HEMOGLOBIN (HGB A1C): Hemoglobin A1C: 8.4

## 2017-03-06 MED ORDER — INSULIN ISOPHANE HUMAN 100 UNIT/ML KWIKPEN: 90.0000 [IU] | PEN_INJECTOR | SUBCUTANEOUS | 0 refills | Status: DC

## 2017-03-06 NOTE — Progress Notes (Signed)
Subjective:    Patient ID: Jocelyn Mendez, female    DOB: 1931-02-03, 84 y.o.   MRN: [id number]  HPI Pt returns for f/u of diabetes mellitus: DM type: Insulin-requiring type 2 Dx'ed: 1988.   Complications: polyneuropathy and renal insuff.   Therapy: insulin since 2012.  GDM: never DKA: never Severe hypoglycemia: never.   Pancreatitis: never.   Other: she declines multiple daily injections; she uses pen, due to visual problems; on levemir, she had am hypoglycemia and pm hyperglycemia, so she was changed to QAM NPH.   Interval history:  pt states she feels well in general, except for recent URI.  Soon after last ov, she increased the NPH to 85 units QAM, due to high cbg's.  No recent steroids.  no cbg record, but states since the increase, cbg's are in the 200's. There is no trend throughout the day. Past Medical History:  Diagnosis Date  . Acute bronchitis   . Anemia of other chronic disease   . Anxiety state, unspecified   . Chest pain, unspecified   . Diverticulosis of colon (without mention of hemorrhage)   . Esophageal reflux   . Gallstones   . Generalized osteoarthrosis, unspecified site   . Headache(784.0)   . Lumbago   . Neuropathy in diabetes ([place])    bilat LE's  . Osteoporosis   . Other and unspecified hyperlipidemia   . Other B-complex deficiencies   . Type II or unspecified type diabetes mellitus without mention of complication, not stated as uncontrolled   . Unspecified disorder resulting from impaired renal function   . Unspecified essential hypertension   . Unspecified hypothyroidism     Past Surgical History:  Procedure Laterality Date  . APPENDECTOMY    . THYROIDECTOMY      Social History   Socioeconomic History  . Marital status: Divorced    Spouse name: Not on file  . Number of children: 6  . Years of education: Not on file  . Highest education level: Not on file  Social Needs  . Financial resource strain: Somewhat hard  . Food insecurity -  worry: Sometimes true  . Food insecurity - inability: Sometimes true  . Transportation needs - medical: No  . Transportation needs - non-medical: No  Occupational History  . Occupation: retired  Tobacco Use  . Smoking status: Former Smoker    Packs/day: 0.50    Years: 6.00    Pack years: 3.00    Types: Cigarettes    Last attempt to quit: 02/10/1973    Years since quitting: 44.0  . Smokeless tobacco: Former Systems developer    Types: Snuff    Quit date: 04/07/2010  Substance and Sexual Activity  . Alcohol use: No    Alcohol/week: 0.0 oz  . Drug use: No  . Sexual activity: No  Other Topics Concern  . Not on file  Social History Narrative   1 child passed away at 14 weeks old---?crib death   Dtr; [name]; Son lives with her [name]; [name]; roger;     Current Outpatient Medications on File Prior to Visit  Medication Sig Dispense Refill  . albuterol (PROVENTIL HFA;VENTOLIN HFA) 108 (90 BASE) MCG/ACT inhaler Inhale 2 puffs into the lungs every 6 (six) hours as needed for wheezing or shortness of breath. 1 Inhaler 6  . amLODipine (NORVASC) 5 MG tablet Take 2 tablets (10 mg total) by mouth daily. 60 tablet 0  . aspirin 81 MG tablet Take 81 mg  by mouth daily.      . Cholecalciferol (VITAMIN D3) 2000 UNITS TABS Take 2,000 Units by mouth 2 (two) times daily.     . cyanocobalamin (,VITAMIN B-12,) 1000 MCG/ML injection INJECT 1ML INTO THE MUSCLE ONCE A MONTH 1 mL 11  . fenofibrate 160 MG tablet TAKE 1 TABLET BY MOUTH DAILY 30 tablet 5  . ferrous sulfate 325 (65 FE) MG EC tablet Take 325 mg by mouth 2 (two) times daily.     . furosemide (LASIX) 20 MG tablet TAKE ONE TABLET BY MOUTH EVERY DAY AS NEEDED FOR FLUID OR EDEMA (WEIGHT GAIN OF MORE THAN 3LBS IN ONE DAY OR 5LBS IN TWO DAYS) 30 tablet 3  . GLOBAL EASE INJECT PEN NEEDLES 32G X 4 MM MISC USE AS DIRECTED WITH THE HUMULIN PEN INSULIN (100 DAY SUPPLY) 100 each 2  . glucose blood (ONE TOUCH ULTRA TEST) test strip USE TO test blood sugar FOUR  TIMES DAILY 120 each 11  . guaiFENesin (MUCINEX) 600 MG 12 hr tablet Take 1 tablet (600 mg total) by mouth 2 (two) times daily. 30 tablet 0  . Lancets (ONETOUCH ULTRASOFT) lancets Use as instructed 100 each 6  . latanoprost (XALATAN) 0.005 % ophthalmic solution Place 1 drop into both eyes at bedtime.  11  . levothyroxine (SYNTHROID, LEVOTHROID) 175 MCG tablet TAKE 1 TABLET BY MOUTH EVERY DAY BEFORE BREAKFAST (Patient taking differently: TAKE 175 MCG BY MOUTH EVERY DAY BEFORE BREAKFAST) 90 tablet 3  . NEEDLE, DISP, 25 G (BD DISP NEEDLES) 25G X 5/8" MISC Use as directed to administer  the b12 injection monthly 50 each 2  . polyethylene glycol (MIRALAX / GLYCOLAX) packet Take 17 g by mouth daily. 14 each 0  . rosuvastatin (CRESTOR) 20 MG tablet TAKE ONE TABLET BY MOUTH DAILY 30 tablet 3  . saccharomyces boulardii (FLORASTOR) 250 MG capsule Take 1 capsule (250 mg total) by mouth 2 (two) times daily. 60 capsule 0  . [DISCONTINUED] insulin glargine (LANTUS SOLOSTAR) 100 UNIT/ML injection Inject 100 Units into the skin every morning. 45 mL PRN   No current facility-administered medications on file prior to visit.     Allergies  Allergen Reactions  . Codeine Other (See Comments)    REACTION: change in mental status  . Diphenhydramine Hcl Itching  . Quinine Other (See Comments)    REACTION: abnormal sensations in face  . Sulfonamide Derivatives Rash    Family History  Problem Relation Age of Onset  . Diabetes Mother   . Cancer Father        unsure ? stomach  . Heart attack Brother        x 3  . Cancer Sister        unsure type  . Lung cancer Brother        smoker  . Heart attack Sister     BP (!) 148/62 (BP Location: Left Arm, Patient Position: Sitting, Cuff Size: Normal)   Pulse 96   Wt 164 lb 3.2 oz (74.5 kg)   SpO2 97%   BMI 26.50 kg/m    Review of Systems She denies hypoglycemia    Objective:   Physical Exam VITAL SIGNS:  See vs page.  GENERAL: no distress.  Pulses:  foot pulses are intact bilaterally.   MSK: no deformity of the feet or ankles.  CV: no edema of the legs, but there are bilat vv's.  Skin:  no ulcer on the feet or ankles, but the skin is dry  and scaly.  normal color and temp on the feet and ankles Neuro: sensation is intact to touch on the feet and ankles.   Ext: There is bilateral onychomycosis of the toenails.   Lab Results  Component Value Date   CREATININE 1.77 (H) 03/05/2017   BUN 36 (H) 03/05/2017   NA 139 03/05/2017   K 4.9 03/05/2017   CL 110 03/05/2017   CO2 23 03/05/2017   A1c=8.1%      Assessment & Plan:  Insulin-requiring type 2 DM: worse.  Renal insuff: in this setting, she should not have a longer-acting qd insulin.   Patient Instructions  check your blood sugar twice a day.  vary the time of day when you check, between before the 3 meals, and at bedtime.  also check if you have symptoms of your blood sugar being too high or too low.  please keep a record of the readings and bring it to your next appointment here.  You can write it on any piece of paper.  please call us sooner if your blood sugar goes below 70, or if you have a lot of readings over 200.  Please increase the insulin to 90 units each morning. On this type of insulin schedule, you should eat meals on a regular schedule.  If a meal is missed or significantly delayed, your blood sugar could go low.   Please come back for a follow-up appointment in 4 months.

## 2017-03-06 NOTE — Patient Instructions (Addendum)
check your blood sugar twice a day.  vary the time of day when you check, between before the 3 meals, and at bedtime.  also check if you have symptoms of your blood sugar being too high or too low.  please keep a record of the readings and bring it to your next appointment here.  You can write it on any piece of paper.  please call us sooner if your blood sugar goes below 70, or if you have a lot of readings over 200.  Please increase the insulin to 90 units each morning. On this type of insulin schedule, you should eat meals on a regular schedule.  If a meal is missed or significantly delayed, your blood sugar could go low.   Please come back for a follow-up appointment in 4 months.

## 2017-04-05 ENCOUNTER — Other Ambulatory Visit: Payer: Self-pay | Admitting: Endocrinology

## 2017-04-20 ENCOUNTER — Other Ambulatory Visit: Payer: Self-pay | Admitting: Internal Medicine

## 2017-04-22 ENCOUNTER — Other Ambulatory Visit: Payer: Self-pay | Admitting: Internal Medicine

## 2017-04-22 NOTE — Telephone Encounter (Signed)
Can you call and find out if she is taking amlodipine and if so what dose and how many pills? This rx is for 10 mg daily (2 pills of 5 mg) but has not been sent in since the summer.

## 2017-04-22 NOTE — Telephone Encounter (Signed)
Called patient  They are taking 1 pill daily 5mg . States not due for refill till next month

## 2017-05-04 ENCOUNTER — Other Ambulatory Visit: Payer: Self-pay | Admitting: Endocrinology

## 2017-06-03 ENCOUNTER — Other Ambulatory Visit: Payer: Self-pay | Admitting: *Deleted

## 2017-06-03 MED ORDER — INSULIN ISOPHANE HUMAN 100 UNIT/ML KWIKPEN: 90.0000 [IU] | PEN_INJECTOR | SUBCUTANEOUS | 0 refills | Status: DC

## 2017-06-22 ENCOUNTER — Other Ambulatory Visit: Payer: Self-pay | Admitting: Endocrinology

## 2017-06-22 ENCOUNTER — Other Ambulatory Visit: Payer: Self-pay | Admitting: Internal Medicine

## 2017-07-07 ENCOUNTER — Ambulatory Visit (INDEPENDENT_AMBULATORY_CARE_PROVIDER_SITE_OTHER): Payer: Medicare Other | Admitting: Endocrinology

## 2017-07-07 ENCOUNTER — Encounter: Payer: Self-pay | Admitting: Endocrinology

## 2017-07-07 VITALS — BP 128/68 | HR 84 | Wt 168.4 lb

## 2017-07-07 DIAGNOSIS — Z794 Long term (current) use of insulin: Secondary | ICD-10-CM | POA: Diagnosis not present

## 2017-07-07 DIAGNOSIS — N183 Chronic kidney disease, stage 3 (moderate): Secondary | ICD-10-CM

## 2017-07-07 DIAGNOSIS — E1122 Type 2 diabetes mellitus with diabetic chronic kidney disease: Secondary | ICD-10-CM | POA: Diagnosis not present

## 2017-07-07 LAB — POCT GLYCOSYLATED HEMOGLOBIN (HGB A1C): Hemoglobin A1C: 7.9 % — AB (ref 4.0–5.6)

## 2017-07-07 NOTE — Progress Notes (Signed)
Subjective:    Patient ID: Jocelyn Mendez, female    DOB: 07-Oct-1930, 84 y.o.   MRN: [id number]  HPI Pt returns for f/u of diabetes mellitus: DM type: Insulin-requiring type 2 Dx'ed: 1988.   Complications: polyneuropathy and renal insuff.   Therapy: insulin since 2012.  GDM: never DKA: never Severe hypoglycemia: never.   Pancreatitis: never.   Other: she declines multiple daily injections; she uses pen, due to visual problems; on levemir, she had am hypoglycemia and pm hyperglycemia, so she was changed to QAM NPH.   Interval history:  She takes insulin as rx'ed.  pt states she feels well in general.  no cbg record, but states cbg's are well-controlled.  It is in general higher as the day goes on, but not necessarily so.   Past Medical History:  Diagnosis Date  . Acute bronchitis   . Anemia of other chronic disease   . Anxiety state, unspecified   . Chest pain, unspecified   . Diverticulosis of colon (without mention of hemorrhage)   . Esophageal reflux   . Gallstones   . Generalized osteoarthrosis, unspecified site   . Headache(784.0)   . Lumbago   . Neuropathy in diabetes ([place])    bilat LE's  . Osteoporosis   . Other and unspecified hyperlipidemia   . Other B-complex deficiencies   . Type II or unspecified type diabetes mellitus without mention of complication, not stated as uncontrolled   . Unspecified disorder resulting from impaired renal function   . Unspecified essential hypertension   . Unspecified hypothyroidism     Past Surgical History:  Procedure Laterality Date  . APPENDECTOMY    . THYROIDECTOMY      Social History   Socioeconomic History  . Marital status: Divorced    Spouse name: Not on file  . Number of children: 6  . Years of education: Not on file  . Highest education level: Not on file  Occupational History  . Occupation: retired  Scientific laboratory technician  . Financial resource strain: Somewhat hard  . Food insecurity:    Worry: Sometimes true   Inability: Sometimes true  . Transportation needs:    Medical: No    Non-medical: No  Tobacco Use  . Smoking status: Former Smoker    Packs/day: 0.50    Years: 6.00    Pack years: 3.00    Types: Cigarettes    Last attempt to quit: 02/10/1973    Years since quitting: 44.4  . Smokeless tobacco: Former Systems developer    Types: Snuff    Quit date: 04/07/2010  Substance and Sexual Activity  . Alcohol use: No    Alcohol/week: 0.0 oz  . Drug use: No  . Sexual activity: Never  Lifestyle  . Physical activity:    Days per week: 3 days    Minutes per session: 30 min  . Stress: Rather much  Relationships  . Social connections:    Talks on phone: More than three times a week    Gets together: More than three times a week    Attends religious service: Not on file    Active member of club or organization: Not on file    Attends meetings of clubs or organizations: Not on file    Relationship status: Divorced  . Intimate partner violence:    Fear of current or ex partner: Not on file    Emotionally abused: Not on file    Physically abused: Not on file    Forced  sexual activity: Not on file  Other Topics Concern  . Not on file  Social History Narrative   1 child passed away at 42 weeks old---?crib death   Dtr; [name]; Son lives with her [name]; [name]; roger;     Current Outpatient Medications on File Prior to Visit  Medication Sig Dispense Refill  . albuterol (PROVENTIL HFA;VENTOLIN HFA) 108 (90 BASE) MCG/ACT inhaler Inhale 2 puffs into the lungs every 6 (six) hours as needed for wheezing or shortness of breath. 1 Inhaler 6  . amLODipine (NORVASC) 5 MG tablet Take 2 tablets (10 mg total) by mouth daily. 60 tablet 0  . amLODipine (NORVASC) 5 MG tablet TAKE ONE TABLET BY MOUTH EVERY DAY 90 tablet 2  . aspirin 81 MG tablet Take 81 mg by mouth daily.      . Cholecalciferol (VITAMIN D3) 2000 UNITS TABS Take 2,000 Units by mouth 2 (two) times daily.     . cyanocobalamin (,VITAMIN B-12,)  1000 MCG/ML injection INJECT 1ML INTO THE MUSCLE ONCE A MONTH 1 mL 11  . fenofibrate 160 MG tablet TAKE ONE TABLET BY MOUTH DAILY 30 tablet 5  . ferrous sulfate 325 (65 FE) MG EC tablet Take 325 mg by mouth 2 (two) times daily.     . furosemide (LASIX) 20 MG tablet TAKE ONE TABLET BY MOUTH EVERY DAY AS NEEDED FOR FLUID OR EDEMA (WEIGHT GAIN OF MORE THAN 3LBS IN ONE DAY OR 5LBS IN TWO DAYS) 30 tablet 3  . GLOBAL EASE INJECT PEN NEEDLES 32G X 4 MM MISC USE AS DIRECTED WITH THE HUMULIN PEN INSULIN (100 DAY SUPPLY) 100 each 2  . glucose blood (ONE TOUCH ULTRA TEST) test strip USE TO test blood sugar FOUR TIMES DAILY 120 each 11  . guaiFENesin (MUCINEX) 600 MG 12 hr tablet Take 1 tablet (600 mg total) by mouth 2 (two) times daily. 30 tablet 0  . Insulin NPH, Human,, Isophane, (HUMULIN N KWIKPEN) 100 UNIT/ML Kiwkpen Inject 90 Units into the skin every morning. 45 mL 0  . Lancets (ONETOUCH ULTRASOFT) lancets Use as instructed 100 each 6  . latanoprost (XALATAN) 0.005 % ophthalmic solution Place 1 drop into both eyes at bedtime.  11  . levothyroxine (SYNTHROID, LEVOTHROID) 175 MCG tablet TAKE ONE TABLET BY MOUTH EVERY DAY BEFORE breakfast 90 tablet 3  . NEEDLE, DISP, 25 G (BD DISP NEEDLES) 25G X 5/8" MISC Use as directed to administer  the b12 injection monthly 50 each 2  . polyethylene glycol (MIRALAX / GLYCOLAX) packet Take 17 g by mouth daily. 14 each 0  . rosuvastatin (CRESTOR) 20 MG tablet TAKE ONE TABLET BY MOUTH DAILY 30 tablet 3  . saccharomyces boulardii (FLORASTOR) 250 MG capsule Take 1 capsule (250 mg total) by mouth 2 (two) times daily. 60 capsule 0  . [DISCONTINUED] insulin glargine (LANTUS SOLOSTAR) 100 UNIT/ML injection Inject 100 Units into the skin every morning. 45 mL PRN   No current facility-administered medications on file prior to visit.     Allergies  Allergen Reactions  . Codeine Other (See Comments)    REACTION: change in mental status  . Diphenhydramine Hcl Itching  .  Quinine Other (See Comments)    REACTION: abnormal sensations in face  . Sulfonamide Derivatives Rash    Family History  Problem Relation Age of Onset  . Diabetes Mother   . Cancer Father        unsure ? stomach  . Heart attack Brother  x 3  . Cancer Sister        unsure type  . Lung cancer Brother        smoker  . Heart attack Sister     BP 128/68   Pulse 84   Wt 168 lb 6.4 oz (76.4 kg)   SpO2 96%   BMI 27.18 kg/m    Review of Systems She denies hypoglycemia    Objective:   Physical Exam VITAL SIGNS:  See vs page.  GENERAL: no distress.  Pulses: foot pulses are intact bilaterally.   MSK: no deformity of the feet or ankles.  CV: no edema of the legs, but there are bilat vv's.  Skin:  no ulcer on the feet or ankles, but the skin is dry and scaly.  normal color and temp on the feet and ankles Neuro: sensation is intact to touch on the feet and ankles.   Ext: There is bilateral onychomycosis of the toenails.    Lab Results  Component Value Date   HGBA1C 7.9 (A) 07/07/2017   Lab Results  Component Value Date   CREATININE 1.77 (H) 03/05/2017   BUN 36 (H) 03/05/2017   NA 139 03/05/2017   K 4.9 03/05/2017   CL 110 03/05/2017   CO2 23 03/05/2017       Assessment & Plan:  Insulin-requiring type 2 DM: this is the best control this pt should aim for, given this regimen, which does match insulin to her changing needs throughout the day Renal insuff: in this setting, she needs an intermediate-acting qd insulin.    Patient Instructions  check your blood sugar twice a day.  vary the time of day when you check, between before the 3 meals, and at bedtime.  also check if you have symptoms of your blood sugar being too high or too low.  please keep a record of the readings and bring it to your next appointment here.  You can write it on any piece of paper.  please call us sooner if your blood sugar goes below 70, or if you have a lot of readings over 200.  Please  continue the same insulin.   On this type of insulin schedule, you should eat meals on a regular schedule.  If a meal is missed or significantly delayed, your blood sugar could go low.    Please come back for a follow-up appointment in 4 months.

## 2017-07-07 NOTE — Patient Instructions (Addendum)
check your blood sugar twice a day.  vary the time of day when you check, between before the 3 meals, and at bedtime.  also check if you have symptoms of your blood sugar being too high or too low.  please keep a record of the readings and bring it to your next appointment here.  You can write it on any piece of paper.  please call us sooner if your blood sugar goes below 70, or if you have a lot of readings over 200.  Please continue the same insulin.   On this type of insulin schedule, you should eat meals on a regular schedule.  If a meal is missed or significantly delayed, your blood sugar could go low.    Please come back for a follow-up appointment in 4 months.

## 2017-07-28 ENCOUNTER — Encounter: Payer: Self-pay | Admitting: Internal Medicine

## 2017-07-28 ENCOUNTER — Ambulatory Visit (INDEPENDENT_AMBULATORY_CARE_PROVIDER_SITE_OTHER): Payer: Medicare Other | Admitting: Internal Medicine

## 2017-07-28 VITALS — BP 130/60 | HR 87 | Temp 97.8°F | Ht 66.0 in | Wt 165.0 lb

## 2017-07-28 DIAGNOSIS — M7062 Trochanteric bursitis, left hip: Secondary | ICD-10-CM | POA: Insufficient documentation

## 2017-07-28 MED ORDER — TRAMADOL HCL 50 MG PO TABS: 25.0000 mg | ORAL_TABLET | Freq: Two times a day (BID) | ORAL | 0 refills | Status: DC | PRN

## 2017-07-28 MED ORDER — METHYLPREDNISOLONE ACETATE 40 MG/ML IJ SUSP
40.0000 mg | Freq: Once | INTRAMUSCULAR | Status: AC
Start: 2017-07-28 — End: 2017-07-28
  Administered 2017-07-28: 40 mg via INTRAMUSCULAR

## 2017-07-28 NOTE — Progress Notes (Signed)
   Subjective:    Patient ID: Jocelyn Mendez, female    DOB: 07/05/1930, 84 y.o.   MRN: [id number]  HPI The patient is an 84 YO female coming in for left hip pain. Started about 4 days ago. 10/10 pain. Radiates down to her knee. Denies falls or injury. Is taking tylenol and not helping. Is intermittent. Walking hurts and makes it hard to get up and down. Hurts to touch. Has not tried heat or ice.   Review of Systems  Constitutional: Positive for activity change. Negative for appetite change, chills, fatigue, fever and unexpected weight change.  Respiratory: Negative.   Cardiovascular: Negative.   Gastrointestinal: Negative.   Musculoskeletal: Positive for arthralgias, gait problem and myalgias. Negative for back pain and joint swelling.  Skin: Negative.       Objective:   Physical Exam  Constitutional: She is oriented to person, place, and time. She appears well-developed and well-nourished.  HENT:  Head: Normocephalic and atraumatic.  Eyes: EOM are normal.  Neck: Normal range of motion.  Cardiovascular: Normal rate and regular rhythm.  Pulmonary/Chest: Effort normal and breath sounds normal. No respiratory distress. She has no wheezes. She has no rales.  Abdominal: Soft. She exhibits no distension. There is no tenderness. There is no rebound.  Musculoskeletal: She exhibits tenderness. She exhibits no edema.  Pain over the left trochanter bursa  Neurological: She is alert and oriented to person, place, and time. Coordination normal.  Skin: Skin is warm and dry.   Vitals:   07/28/17 1041  BP: 130/60  Pulse: 87  Temp: 97.8 F (36.6 C)  TempSrc: Oral  SpO2: 97%  Weight: 165 lb (74.8 kg)  Height: 5\' 6"  (1.676 m)      Assessment & Plan:  Depo-medrol 40 mg IM given at visit

## 2017-07-28 NOTE — Assessment & Plan Note (Signed)
Depo-medrol 40 mg IM given today and tramadol prescription for pain given her CKD NSAIDs are not an option. Continue tylenol as needed.

## 2017-07-28 NOTE — Patient Instructions (Signed)
We have given you a shot today that should help within 3-4 days for the pain.   Use heat on the area to help as well.   We have sent in tramadol that you can use 1/2 pill up to twice a day to help with pain. If 1/2 pill does not work it is okay to take a whole pill twice a day for pain.    Trochanteric Bursitis Rehab Ask your health care provider which exercises are safe for you. Do exercises exactly as told by your health care provider and adjust them as directed. It is normal to feel mild stretching, pulling, tightness, or discomfort as you do these exercises, but you should stop right away if you feel sudden pain or your pain gets worse.Do not begin these exercises until told by your health care provider. Stretching exercises These exercises warm up your muscles and joints and improve the movement and flexibility of your hip. These exercises also help to relieve pain and stiffness. Exercise A: Iliotibial band stretch  1. Lie on your side with your left / right leg in the top position. 2. Bend your left / right knee and grab your ankle. 3. Slowly bring your knee back so your thigh is behind your body. 4. Slowly lower your knee toward the floor until you feel a gentle stretch on the outside of your left / right thigh. If you do not feel a stretch and your knee will not fall farther, place the heel of your other foot on top of your outer knee and pull your thigh down farther. 5. Hold this position for __________ seconds. 6. Slowly return to the starting position. Repeat __________ times. Complete this exercise __________ times a day. Strengthening exercises These exercises build strength and endurance in your hip and pelvis. Endurance is the ability to use your muscles for a long time, even after they get tired. Exercise B: Bridge ( hip extensors) 1. Lie on your back on a firm surface with your knees bent and your feet flat on the floor. 2. Tighten your buttocks muscles and lift your  buttocks off the floor until your trunk is level with your thighs. You should feel the muscles working in your buttocks and the back of your thighs. If this exercise is too easy, try doing it with your arms crossed over your chest. 3. Hold this position for __________ seconds. 4. Slowly return to the starting position. 5. Let your muscles relax completely between repetitions. Repeat __________ times. Complete this exercise __________ times a day. Exercise C: Squats ( knee extensors and  quadriceps) 1. Stand in front of a table, with your feet and knees pointing straight ahead. You may rest your hands on the table for balance but not for support. 2. Slowly bend your knees and lower your hips like you are going to sit in a chair. ? Keep your weight over your heels, not over your toes. ? Keep your lower legs upright so they are parallel with the table legs. ? Do not let your hips go lower than your knees. ? Do not bend lower than told by your health care provider. ? If your hip pain increases, do not bend as low. 3. Hold this position for __________ seconds. 4. Slowly push with your legs to return to standing. Do not use your hands to pull yourself to standing. Repeat __________ times. Complete this exercise __________ times a day. Exercise D: Hip hike 1. Stand sideways on a bottom step. Stand  on your left / right leg with your other foot unsupported next to the step. You can hold onto the railing or wall if needed for balance. 2. Keeping your knees straight and your torso square, lift your left / right hip up toward the ceiling. 3. Hold this position for __________ seconds. 4. Slowly let your left / right hip lower toward the floor, past the starting position. Your foot should get closer to the floor. Do not lean or bend your knees. Repeat __________ times. Complete this exercise __________ times a day. Exercise E: Single leg stand 1. Stand near a counter or door frame that you can hold onto  for balance as needed. It is helpful to stand in front of a mirror for this exercise so you can watch your hip. 2. Squeeze your left / right buttock muscles then lift up your other foot. Do not let your left / right hip push out to the side. 3. Hold this position for __________ seconds. Repeat __________ times. Complete this exercise __________ times a day. This information is not intended to replace advice given to you by your health care provider. Make sure you discuss any questions you have with your health care provider. Document Released: 03/06/2004 Document Revised: 10/04/2015 Document Reviewed: 01/12/2015 Elsevier Interactive Patient Education  [employer].

## 2017-08-14 ENCOUNTER — Encounter: Payer: Self-pay | Admitting: Internal Medicine

## 2017-08-14 ENCOUNTER — Ambulatory Visit (INDEPENDENT_AMBULATORY_CARE_PROVIDER_SITE_OTHER)
Admission: RE | Admit: 2017-08-14 | Discharge: 2017-08-14 | Disposition: A | Discharge status: Home / Self Care | Payer: Medicare Other | Source: Ambulatory Visit | Attending: Internal Medicine | Admitting: Internal Medicine

## 2017-08-14 ENCOUNTER — Ambulatory Visit (INDEPENDENT_AMBULATORY_CARE_PROVIDER_SITE_OTHER): Payer: Medicare Other | Admitting: Internal Medicine

## 2017-08-14 VITALS — BP 120/62 | HR 65 | Temp 97.8°F | Ht 66.0 in | Wt 165.0 lb

## 2017-08-14 DIAGNOSIS — M7062 Trochanteric bursitis, left hip: Secondary | ICD-10-CM

## 2017-08-14 DIAGNOSIS — M25552 Pain in left hip: Secondary | ICD-10-CM | POA: Diagnosis not present

## 2017-08-14 MED ORDER — GABAPENTIN 100 MG PO CAPS: 100.0000 mg | ORAL_CAPSULE | Freq: Three times a day (TID) | ORAL | 3 refills | Status: DC

## 2017-08-14 NOTE — Assessment & Plan Note (Addendum)
Having some improvement since last visit but still is having significant pain. She will see sports medicine for injection. Rx for gabapentin to see if this helps with pain. Stop tramadol as this is not helping. Taking tylenol which helps for pain and she will continue. Max dose 3000 mg daily reminded. Checking x-ray to rule out arthritis.

## 2017-08-14 NOTE — Progress Notes (Signed)
   Subjective:    Patient ID: Jocelyn Mendez, female    DOB: 11-25-1930, 84 y.o.   MRN: [id number]  HPI The patient is an 84 YO female coming in for follow up of left hip pain. She has been using tramadol but it is not helping. She cannot even tell she has taken it so she stopped using it. She denies falls. The pain is less severe at this time. She rates is 8/10 and then 6/10 after taking tylenol. She is taking total 6 tylenol per day for pain. She denies numbness but is having some instability of her left leg. She denies falls due to this. She denies swelling. No rash. Overall gradually improving since last month.   Review of Systems  Constitutional: Positive for activity change. Negative for appetite change, chills, fatigue, fever and unexpected weight change.  Respiratory: Negative.   Cardiovascular: Negative.   Gastrointestinal: Negative.   Musculoskeletal: Positive for arthralgias, gait problem and myalgias. Negative for back pain and joint swelling.  Skin: Negative.   Neurological: Negative for dizziness, syncope, speech difficulty, weakness and headaches.      Objective:   Physical Exam  Constitutional: She is oriented to person, place, and time. She appears well-developed and well-nourished.  HENT:  Head: Normocephalic and atraumatic.  Eyes: EOM are normal.  Neck: Normal range of motion.  Cardiovascular: Normal rate and regular rhythm.  Pulmonary/Chest: Effort normal and breath sounds normal. No respiratory distress. She has no wheezes. She has no rales.  Abdominal: Soft. Bowel sounds are normal. She exhibits no distension. There is no tenderness. There is no rebound.  Musculoskeletal: She exhibits tenderness. She exhibits no edema.  Pain over the lateral left thigh which is worse with palpation.   Neurological: She is alert and oriented to person, place, and time. Coordination normal.  Skin: Skin is warm and dry.   Vitals:   08/14/17 0938  BP: 120/62  Pulse: 65  Temp:  97.8 F (36.6 C)  TempSrc: Oral  SpO2: 98%  Weight: 165 lb (74.8 kg)  Height: 5\' 6"  (1.676 m)      Assessment & Plan:

## 2017-08-14 NOTE — Patient Instructions (Addendum)
We will check an x-ray today.   We will get you in with a sports medicine doctor for the hip.   We have sent in gabapentin for the pain which you can take 1 pill up to 3 times per day.   It is okay to continue taking tylenol up to 6 pills per day.

## 2017-08-18 DIAGNOSIS — H401133 Primary open-angle glaucoma, bilateral, severe stage: Secondary | ICD-10-CM | POA: Diagnosis not present

## 2017-09-04 ENCOUNTER — Ambulatory Visit: Payer: Medicare Other | Admitting: Family Medicine

## 2017-09-28 ENCOUNTER — Encounter: Payer: Self-pay | Admitting: Internal Medicine

## 2017-09-28 ENCOUNTER — Ambulatory Visit: Payer: Medicare Other | Admitting: Internal Medicine

## 2017-09-28 ENCOUNTER — Other Ambulatory Visit (INDEPENDENT_AMBULATORY_CARE_PROVIDER_SITE_OTHER): Payer: Medicare Other

## 2017-09-28 ENCOUNTER — Ambulatory Visit (INDEPENDENT_AMBULATORY_CARE_PROVIDER_SITE_OTHER): Payer: Medicare Other | Admitting: Internal Medicine

## 2017-09-28 VITALS — BP 118/60 | HR 91 | Temp 98.0°F | Ht 66.0 in | Wt 169.0 lb

## 2017-09-28 DIAGNOSIS — E1122 Type 2 diabetes mellitus with diabetic chronic kidney disease: Secondary | ICD-10-CM

## 2017-09-28 DIAGNOSIS — E890 Postprocedural hypothyroidism: Secondary | ICD-10-CM | POA: Diagnosis not present

## 2017-09-28 DIAGNOSIS — Z794 Long term (current) use of insulin: Secondary | ICD-10-CM

## 2017-09-28 DIAGNOSIS — N183 Chronic kidney disease, stage 3 unspecified: Secondary | ICD-10-CM

## 2017-09-28 LAB — CBC
HEMATOCRIT: 37.1 % (ref 36.0–46.0)
HEMOGLOBIN: 12.1 g/dL (ref 12.0–15.0)
MCHC: 32.5 g/dL (ref 30.0–36.0)
MCV: 93.6 fl (ref 78.0–100.0)
Platelets: 253 10*3/uL (ref 150.0–400.0)
RBC: 3.97 Mil/uL (ref 3.87–5.11)
RDW: 13.6 % (ref 11.5–15.5)
WBC: 7.2 10*3/uL (ref 4.0–10.5)

## 2017-09-28 LAB — COMPREHENSIVE METABOLIC PANEL
ALBUMIN: 4.4 g/dL (ref 3.5–5.2)
ALK PHOS: 54 U/L (ref 39–117)
ALT: 12 U/L (ref 0–35)
AST: 16 U/L (ref 0–37)
BUN: 36 mg/dL — ABNORMAL HIGH (ref 6–23)
CO2: 23 mEq/L (ref 19–32)
Calcium: 9.8 mg/dL (ref 8.4–10.5)
Chloride: 109 mEq/L (ref 96–112)
Creatinine, Ser: 1.98 mg/dL — ABNORMAL HIGH (ref 0.40–1.20)
GFR: 25.47 mL/min — AB (ref >60.00)
Glucose, Bld: 58 mg/dL — ABNORMAL LOW (ref 70–99)
POTASSIUM: 4.3 meq/L (ref 3.5–5.1)
Sodium: 140 mEq/L (ref 135–145)
TOTAL PROTEIN: 7.4 g/dL (ref 6.0–8.3)
Total Bilirubin: 0.4 mg/dL (ref 0.2–1.2)

## 2017-09-28 LAB — HEMOGLOBIN A1C: HEMOGLOBIN A1C: 6.5 % (ref 4.6–6.5)

## 2017-09-28 LAB — TSH: TSH: 3.74 u[IU]/mL (ref 0.35–4.50)

## 2017-09-28 NOTE — Patient Instructions (Signed)
We will check the labs today and call you back about the results.    

## 2017-09-28 NOTE — Progress Notes (Signed)
   Subjective:    Patient ID: Jocelyn Mendez, female    DOB: May 26, 1930, 84 y.o.   MRN: [id number]  HPI The patient is an 84 YO female coming in for follow up diabetes (sees Dr. Loanne Drilling for sugars normally but feels like she needs labs and had visit with [place] scheduled anyway, is worried about them being higher as her son passed about 3 weeks ago and she has been eating slightly differently) as well as her thyroid (feeling more tired but is not sure if this is grief or her thyroid, wants to make sure no problems, has been taking this daily and separate from other foods or meds).   Review of Systems  Constitutional: Negative.   HENT: Negative.   Eyes: Negative.   Respiratory: Negative for cough, chest tightness and shortness of breath.   Cardiovascular: Negative for chest pain, palpitations and leg swelling.  Gastrointestinal: Negative for abdominal distention, abdominal pain, constipation, diarrhea, nausea and vomiting.  Musculoskeletal: Positive for arthralgias.  Skin: Negative.   Neurological: Positive for numbness.  Psychiatric/Behavioral: Positive for decreased concentration and dysphoric mood.      Objective:   Physical Exam  Constitutional: She is oriented to person, place, and time. She appears well-developed and well-nourished.  HENT:  Head: Normocephalic and atraumatic.  Eyes: EOM are normal.  Neck: Normal range of motion.  Cardiovascular: Normal rate and regular rhythm.  Pulmonary/Chest: Effort normal and breath sounds normal. No respiratory distress. She has no wheezes. She has no rales.  Abdominal: Soft. Bowel sounds are normal. She exhibits no distension. There is no tenderness. There is no rebound.  Musculoskeletal: She exhibits no edema.  Neurological: She is alert and oriented to person, place, and time. Coordination normal.  Skin: Skin is warm and dry.  Psychiatric:  Appropriate emotion when talking about son passing.    Vitals:   09/28/17 1431  BP: 118/60    Pulse: 91  Temp: 98 F (36.7 C)  TempSrc: Oral  SpO2: 97%  Weight: 169 lb (76.7 kg)  Height: 5\' 6"  (1.676 m)      Assessment & Plan:

## 2017-09-29 NOTE — Assessment & Plan Note (Signed)
Needs recheck as last TSH was elevated. Taking synthroid 175 mcg daily. Adjust as needed.

## 2017-09-29 NOTE — Assessment & Plan Note (Signed)
Checking HgA1c and adjust as needed. Will still follow up with endo. Taking humulin and statin. Gabapentin for neuropathy.

## 2017-10-01 ENCOUNTER — Other Ambulatory Visit: Payer: Self-pay | Admitting: Endocrinology

## 2017-10-02 ENCOUNTER — Other Ambulatory Visit: Payer: Self-pay

## 2017-10-02 ENCOUNTER — Telehealth: Payer: Self-pay | Admitting: Endocrinology

## 2017-10-02 NOTE — Telephone Encounter (Signed)
please call patient: How many units are you actually taking?

## 2017-10-02 NOTE — Telephone Encounter (Signed)
-----   Message from Dorna Leitz, [place] sent at 10/02/2017  2:58 PM EDT ----- Regarding: Humulin N Dosing We received a refill request for Humulin N for Jocelyn Mendez. They are asking that 110 units be filled, but chart only says 90 units? Which is correct?

## 2017-10-06 ENCOUNTER — Other Ambulatory Visit: Payer: Self-pay

## 2017-10-06 MED ORDER — INSULIN ISOPHANE HUMAN 100 UNIT/ML KWIKPEN: PEN_INJECTOR | SUBCUTANEOUS | 0 refills | Status: DC

## 2017-10-06 NOTE — Telephone Encounter (Signed)
Patient's daughter [name] stated that patient's insulin was backed down to 110 units. I have sent in refill request to [facility].

## 2017-10-08 ENCOUNTER — Telehealth: Payer: Self-pay | Admitting: Internal Medicine

## 2017-10-08 NOTE — Telephone Encounter (Signed)
Noted  

## 2017-10-08 NOTE — Telephone Encounter (Signed)
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 Patient was last seen on 09/28/2017. Per Dr. Sharlet Salina, she will review the form to see if it can be completed with or without and office visit.  Please call patient to let her know.   I made a copy of the form and gave her back the original because she said that there are parts that would also need to be completed by her eye doctor. Placed in Dr. Nathanial Millman box.

## 2017-10-14 NOTE — Telephone Encounter (Signed)
Form placed up front patient needs to fill out and sign page 1. Patients daughter aware

## 2017-10-21 ENCOUNTER — Other Ambulatory Visit: Payer: Self-pay | Admitting: Internal Medicine

## 2017-11-03 ENCOUNTER — Other Ambulatory Visit: Payer: Self-pay | Admitting: Endocrinology

## 2017-11-04 ENCOUNTER — Encounter: Payer: Self-pay | Admitting: Endocrinology

## 2017-11-04 ENCOUNTER — Ambulatory Visit: Payer: Medicare Other | Admitting: Endocrinology

## 2017-11-04 VITALS — BP 124/54 | HR 100 | Wt 171.4 lb

## 2017-11-04 DIAGNOSIS — E1122 Type 2 diabetes mellitus with diabetic chronic kidney disease: Secondary | ICD-10-CM

## 2017-11-04 DIAGNOSIS — N183 Chronic kidney disease, stage 3 (moderate): Secondary | ICD-10-CM

## 2017-11-04 DIAGNOSIS — Z794 Long term (current) use of insulin: Secondary | ICD-10-CM

## 2017-11-04 MED ORDER — GLUCOSE BLOOD VI STRP: 1.0000 | ORAL_STRIP | Freq: Two times a day (BID) | 11 refills | Status: DC

## 2017-11-04 MED ORDER — INSULIN ISOPHANE HUMAN 100 UNIT/ML KWIKPEN: 80.0000 [IU] | PEN_INJECTOR | SUBCUTANEOUS | 0 refills | Status: DC

## 2017-11-04 NOTE — Patient Instructions (Addendum)
check your blood sugar twice a day.  vary the time of day when you check, between before the 3 meals, and at bedtime.  also check if you have symptoms of your blood sugar being too high or too low.  please keep a record of the readings and bring it to your next appointment here.  You can write it on any piece of paper.  please call us sooner if your blood sugar goes below 70, or if you have a lot of readings over 200.   Please reduce the insulin to 80 units each morning.   Here is a new meter.  I have sent a prescription to your pharmacy, for strips.  On this type of insulin schedule, you should eat meals on a regular schedule.  If a meal is missed or significantly delayed, your blood sugar could go low.   Please come back for a follow-up appointment in 4 months.

## 2017-11-04 NOTE — Progress Notes (Signed)
Subjective:    Patient ID: Jocelyn Mendez, female    DOB: April 19, 1930, 84 y.o.   MRN: [id number]  HPI Pt returns for f/u of diabetes mellitus: DM type: Insulin-requiring type 2 Dx'ed: 1988.   Complications: polyneuropathy and renal insuff.   Therapy: insulin since 2012.  GDM: never.  DKA: never Severe hypoglycemia: never.   Pancreatitis: never.   Other: she declines multiple daily injections; she uses pen, due to visual problems; on levemir, she had am hypoglycemia and pm hyperglycemia, so she was changed to QAM NPH.   Interval history:  She takes insulin as rx'ed.  pt states she feels well in general.  She says her meter is not working.  Past Medical History:  Diagnosis Date  . Acute bronchitis   . Anemia of other chronic disease   . Anxiety state, unspecified   . Chest pain, unspecified   . Diverticulosis of colon (without mention of hemorrhage)   . Esophageal reflux   . Gallstones   . Generalized osteoarthrosis, unspecified site   . Headache(784.0)   . Lumbago   . Neuropathy in diabetes ([place])    bilat LE's  . Osteoporosis   . Other and unspecified hyperlipidemia   . Other B-complex deficiencies   . Type II or unspecified type diabetes mellitus without mention of complication, not stated as uncontrolled   . Unspecified disorder resulting from impaired renal function   . Unspecified essential hypertension   . Unspecified hypothyroidism     Past Surgical History:  Procedure Laterality Date  . APPENDECTOMY    . THYROIDECTOMY      Social History   Socioeconomic History  . Marital status: Divorced    Spouse name: Not on file  . Number of children: 6  . Years of education: Not on file  . Highest education level: Not on file  Occupational History  . Occupation: retired  Scientific laboratory technician  . Financial resource strain: Somewhat hard  . Food insecurity:    Worry: Sometimes true    Inability: Sometimes true  . Transportation needs:    Medical: No    Non-medical: No    Tobacco Use  . Smoking status: Former Smoker    Packs/day: 0.50    Years: 6.00    Pack years: 3.00    Types: Cigarettes    Last attempt to quit: 02/10/1973    Years since quitting: 44.7  . Smokeless tobacco: Former Systems developer    Types: Snuff    Quit date: 04/07/2010  Substance and Sexual Activity  . Alcohol use: No    Alcohol/week: 0.0 standard drinks  . Drug use: No  . Sexual activity: Never  Lifestyle  . Physical activity:    Days per week: 3 days    Minutes per session: 30 min  . Stress: Rather much  Relationships  . Social connections:    Talks on phone: More than three times a week    Gets together: More than three times a week    Attends religious service: Not on file    Active member of club or organization: Not on file    Attends meetings of clubs or organizations: Not on file    Relationship status: Divorced  . Intimate partner violence:    Fear of current or ex partner: Not on file    Emotionally abused: Not on file    Physically abused: Not on file    Forced sexual activity: Not on file  Other Topics Concern  . Not  on file  Social History Narrative   1 child passed away at 76 weeks old---?crib death   Dtr; [name]; Son lives with her [name]; [name]; roger;     Current Outpatient Medications on File Prior to Visit  Medication Sig Dispense Refill  . albuterol (PROVENTIL HFA;VENTOLIN HFA) 108 (90 BASE) MCG/ACT inhaler Inhale 2 puffs into the lungs every 6 (six) hours as needed for wheezing or shortness of breath. 1 Inhaler 6  . amLODipine (NORVASC) 5 MG tablet Take 2 tablets (10 mg total) by mouth daily. 60 tablet 0  . amLODipine (NORVASC) 5 MG tablet TAKE ONE TABLET BY MOUTH EVERY DAY 90 tablet 2  . aspirin 81 MG tablet Take 81 mg by mouth daily.      . Cholecalciferol (VITAMIN D3) 2000 UNITS TABS Take 2,000 Units by mouth 2 (two) times daily.     . cyanocobalamin (,VITAMIN B-12,) 1000 MCG/ML injection INJECT 1ML INTO THE MUSCLE ONCE A MONTH 1 mL 11  .  fenofibrate 160 MG tablet TAKE ONE TABLET BY MOUTH DAILY 30 tablet 5  . ferrous sulfate 325 (65 FE) MG EC tablet Take 325 mg by mouth 2 (two) times daily.     . furosemide (LASIX) 20 MG tablet TAKE ONE TABLET BY MOUTH EVERY DAY AS NEEDED FOR FLUID OR EDEMA (WEIGHT GAIN OF MORE THAN 3LBS IN ONE DAY OR 5LBS IN TWO DAYS) 30 tablet 3  . gabapentin (NEURONTIN) 100 MG capsule Take 1 capsule (100 mg total) by mouth 3 (three) times daily. 90 capsule 3  . GLOBAL EASE INJECT PEN NEEDLES 32G X 4 MM MISC USE AS DIRECTED WITH THE HUMULIN PEN INSULIN (100 DAY SUPPLY) 100 each 2  . guaiFENesin (MUCINEX) 600 MG 12 hr tablet Take 1 tablet (600 mg total) by mouth 2 (two) times daily. 30 tablet 0  . Lancets (ONETOUCH ULTRASOFT) lancets Use as instructed 100 each 6  . latanoprost (XALATAN) 0.005 % ophthalmic solution Place 1 drop into both eyes at bedtime.  11  . levothyroxine (SYNTHROID, LEVOTHROID) 175 MCG tablet TAKE ONE TABLET BY MOUTH EVERY DAY BEFORE breakfast 90 tablet 3  . NEEDLE, DISP, 25 G (BD DISP NEEDLES) 25G X 5/8" MISC Use as directed to administer  the b12 injection monthly 50 each 2  . polyethylene glycol (MIRALAX / GLYCOLAX) packet Take 17 g by mouth daily. 14 each 0  . rosuvastatin (CRESTOR) 20 MG tablet TAKE 1 TABLET BY MOUTH DAILY 30 tablet 5  . saccharomyces boulardii (FLORASTOR) 250 MG capsule Take 1 capsule (250 mg total) by mouth 2 (two) times daily. 60 capsule 0  . HUMULIN N KWIKPEN 100 UNIT/ML Kiwkpen inject 90 units INTO THE SKIN EVERY MORNING 45 mL 0  . [DISCONTINUED] insulin glargine (LANTUS SOLOSTAR) 100 UNIT/ML injection Inject 100 Units into the skin every morning. 45 mL PRN   No current facility-administered medications on file prior to visit.     Allergies  Allergen Reactions  . Codeine Other (See Comments)    REACTION: change in mental status  . Diphenhydramine Hcl Itching  . Quinine Other (See Comments)    REACTION: abnormal sensations in face  . Sulfonamide Derivatives Rash     Family History  Problem Relation Age of Onset  . Diabetes Mother   . Cancer Father        unsure ? stomach  . Heart attack Brother        x 3  . Cancer Sister  unsure type  . Lung cancer Brother        smoker  . Heart attack Sister     BP (!) 124/54 (BP Location: Left Arm)   Pulse 100   Wt 171 lb 6.4 oz (77.7 kg)   SpO2 97%   BMI 27.66 kg/m    Review of Systems She denies hypoglycemia.      Objective:   Physical Exam VITAL SIGNS:  See vs page GENERAL: no distress Pulses: foot pulses are intact bilaterally.   MSK: no deformity of the feet or ankles.  CV: no edema of the legs, but there are bilat vv's.  Skin:  no ulcer on the feet or ankles, but the skin is dry and scaly.  normal color and temp on the feet and ankles Neuro: sensation is intact to touch on the feet and ankles.   Ext: There is severe bilateral onychomycosis of the toenails.    Lab Results  Component Value Date   HGBA1C 6.5 09/28/2017       Assessment & Plan:  Insulin-requiring type 2 DM: overcontrolled, given this regimen, which does match insulin to her changing needs throughout the day. Renal insuff: she may need a faster-acting qd insulin Noncompliance with cbg recording: we need cbg info in order to decide if she needs to change to 70/30   Patient Instructions  check your blood sugar twice a day.  vary the time of day when you check, between before the 3 meals, and at bedtime.  also check if you have symptoms of your blood sugar being too high or too low.  please keep a record of the readings and bring it to your next appointment here.  You can write it on any piece of paper.  please call us sooner if your blood sugar goes below 70, or if you have a lot of readings over 200.   Please reduce the insulin to 80 units each morning.   Here is a new meter.  I have sent a prescription to your pharmacy, for strips.  On this type of insulin schedule, you should eat meals on a regular schedule.   If a meal is missed or significantly delayed, your blood sugar could go low.   Please come back for a follow-up appointment in 4 months.

## 2017-11-19 ENCOUNTER — Other Ambulatory Visit: Payer: Self-pay | Admitting: Internal Medicine

## 2017-12-08 ENCOUNTER — Ambulatory Visit (INDEPENDENT_AMBULATORY_CARE_PROVIDER_SITE_OTHER): Payer: Medicare Other | Admitting: Internal Medicine

## 2017-12-08 ENCOUNTER — Encounter: Payer: Self-pay | Admitting: Internal Medicine

## 2017-12-08 VITALS — BP 120/60 | HR 83 | Temp 98.4°F | Ht 66.0 in | Wt 173.0 lb

## 2017-12-08 DIAGNOSIS — J45901 Unspecified asthma with (acute) exacerbation: Secondary | ICD-10-CM

## 2017-12-08 DIAGNOSIS — J309 Allergic rhinitis, unspecified: Secondary | ICD-10-CM | POA: Insufficient documentation

## 2017-12-08 DIAGNOSIS — J3089 Other allergic rhinitis: Secondary | ICD-10-CM | POA: Diagnosis not present

## 2017-12-08 DIAGNOSIS — N6320 Unspecified lump in the left breast, unspecified quadrant: Secondary | ICD-10-CM

## 2017-12-08 MED ORDER — METHYLPREDNISOLONE ACETATE 40 MG/ML IJ SUSP
40.0000 mg | Freq: Once | INTRAMUSCULAR | Status: AC
  Administered 2017-12-08: 40 mg via INTRAMUSCULAR

## 2017-12-08 NOTE — Assessment & Plan Note (Signed)
Ordered diagnostic mammogram for the patient given intermittent findings and no recent mammogram.

## 2017-12-08 NOTE — Progress Notes (Signed)
   Subjective:    Patient ID: Jocelyn Mendez, female    DOB: 06-Jul-1930, 84 y.o.   MRN: [id number]  HPI The patient is an 84 YO female coming in for feeling poorly the last week or so. Got the flu shot about 1 week ago. She is having some muscle aches and fatigue. Some increase in SOB. Denies wheezing or increased cough. Not using albuterol recently as she was not sure it would help. She is having some increasing sinus congestion and pressure. She is taking zyrtec and this is helping some. Overall stable since onset. Has not tried any otc cold medications as she was not sure if they were safe for her. Denies fevers or chills. Denies sick contacts.  She is also having a left breast lump which is intermittent. She does not think it is present now. She has not had a mammogram in many years. Denies nipple discharge. No pain when present.   Review of Systems  Constitutional: Positive for activity change, appetite change and fatigue. Negative for chills, fever and unexpected weight change.  HENT: Positive for congestion, postnasal drip, rhinorrhea and sinus pressure. Negative for ear discharge, ear pain, sinus pain, sneezing, sore throat, tinnitus, trouble swallowing and voice change.   Eyes: Negative.   Respiratory: Positive for cough and shortness of breath. Negative for chest tightness and wheezing.   Cardiovascular: Negative.   Gastrointestinal: Negative.   Musculoskeletal: Positive for myalgias.  Neurological: Negative.       Objective:   Physical Exam  Constitutional: She is oriented to person, place, and time. She appears well-developed and well-nourished.  HENT:  Head: Normocephalic and atraumatic.  Oropharynx with redness and clear drainage, nose with swollen turbinates, TMs normal bilaterally  Eyes: EOM are normal.  Neck: Normal range of motion. No thyromegaly present.  Cardiovascular: Normal rate and regular rhythm.  Pulmonary/Chest: Effort normal and breath sounds normal. No  respiratory distress. She has no wheezes. She has no rales.  Some mild rhonchi, no wheezing. Breast exam without any mass or lesion, no axillary LAD bilaterally.   Abdominal: Soft. Bowel sounds are normal. She exhibits no distension. There is no tenderness. There is no rebound.  Musculoskeletal: She exhibits tenderness. She exhibits no edema.  Lymphadenopathy:    She has no cervical adenopathy.  Neurological: She is alert and oriented to person, place, and time. Coordination normal.  Skin: Skin is warm and dry.  Psychiatric: She has a normal mood and affect.   Vitals:   12/08/17 1412  BP: 120/60  Pulse: 83  Temp: 98.4 F (36.9 C)  TempSrc: Oral  SpO2: 97%  Weight: 173 lb (78.5 kg)  Height: 5\' 6"  (1.676 m)      Assessment & Plan:  Depo-medrol 40 mg IM given at visit

## 2017-12-08 NOTE — Patient Instructions (Addendum)
We have given you the shot and will get the mammogram.   I would recommend to try lavender or chamomile essential oils for stress as this is very safe and can be used whenever needed.

## 2017-12-08 NOTE — Assessment & Plan Note (Signed)
Likely due to recent ragweed and mold in the air. She is taking zyrtec. Given depo-medrol 40 mg IM and encouraged to continue taking zyrtec.

## 2017-12-08 NOTE — Assessment & Plan Note (Addendum)
Given depo-medrol 40 mg IM and continue zyrtec. Suspect more allergic rhinitis rather than true asthma flare. Do not suspect that recent flu shot contributed at all. Encouraged to use albuterol as needed.

## 2017-12-15 ENCOUNTER — Other Ambulatory Visit (HOSPITAL_COMMUNITY): Payer: Self-pay | Admitting: Internal Medicine

## 2017-12-15 ENCOUNTER — Other Ambulatory Visit: Payer: Self-pay | Admitting: Internal Medicine

## 2017-12-15 DIAGNOSIS — N6320 Unspecified lump in the left breast, unspecified quadrant: Secondary | ICD-10-CM

## 2017-12-21 ENCOUNTER — Ambulatory Visit: Payer: Self-pay

## 2017-12-21 NOTE — Telephone Encounter (Signed)
Likely visit is needed. More information would be appropriate including if she improved after last visit and this is a separate illness or did she never improve after last visit?

## 2017-12-21 NOTE — Telephone Encounter (Signed)
Appointment has been made for 11/14.

## 2017-12-21 NOTE — Telephone Encounter (Signed)
Daughter called on behalf of mother (who was with pt at the time of the call). Pt c/o productive cough starting two days ago. Pt stated her cough was "not too bad." Pt is coughing up clear sputum. Denies wheezing or shortness of breath. Pt has been taking Robitussin sugar free formula and has been taking Tylenol every 6 hours. Pt stopped her Zyrtec yesterday. Pt's daughter stated the pt feels weak and "blah" and "achy."  Pt was seen in office 12/08/17 and diagnosed with allergic rhinitis and was given a depo-medrol shot and to start Zyrtec.  Care advice given and daughter verbalized understanding. Daughter advised pt will typically need an office visit for evaluation before the physician will prescribed antibiotics. Daughter is wanting to know if Dr Sharlet Salina will call in an antibiotic.  Sending note back to office.  Reason for Disposition . Cough  Answer Assessment - Initial Assessment Questions 1. ONSET: "When did the cough begin?"      2 days ago 2. SEVERITY: "How bad is the cough today?"      "not too bad" 3. RESPIRATORY DISTRESS: "Describe your breathing."      No wheezing or SOB 4. FEVER: "Do you have a fever?" If so, ask: "What is your temperature, how was it measured, and when did it start?"     No- taking Tylenol every 6 hours  5. SPUTUM: "Describe the color of your sputum" (clear, white, yellow, green)     clear 6. HEMOPTYSIS: "Are you coughing up any blood?" If so ask: "How much?" (flecks, streaks, tablespoons, etc.)     no 7. CARDIAC HISTORY: "Do you have any history of heart disease?" (e.g., heart attack, congestive heart failure)      Hear murmur, "heart skips a beat" daughter stated pt has had since birth 70. LUNG HISTORY: "Do you have any history of lung disease?"  (e.g., pulmonary embolus, asthma, emphysema)     no 9. PE RISK FACTORS: "Do you have a history of blood clots?" (or: recent major surgery, recent prolonged travel, bedridden)     no 10. OTHER SYMPTOMS: "Do you have  any other symptoms?" (e.g., runny nose, wheezing, chest pain)      Weak,coughing, feels "blah" 11. PREGNANCY: "Is there any chance you are pregnant?" "When was your last menstrual period?"       n/a 12. TRAVEL: "Have you traveled out of the country in the last month?" (e.g., travel history, exposures)       no  Protocols used: [place]

## 2017-12-21 NOTE — Telephone Encounter (Signed)
Can you make patient an appointment. Thank you

## 2017-12-23 DIAGNOSIS — N6321 Unspecified lump in the left breast, upper outer quadrant: Secondary | ICD-10-CM | POA: Diagnosis not present

## 2017-12-24 ENCOUNTER — Encounter: Payer: Self-pay | Admitting: Internal Medicine

## 2017-12-24 ENCOUNTER — Ambulatory Visit (INDEPENDENT_AMBULATORY_CARE_PROVIDER_SITE_OTHER): Payer: Medicare Other | Admitting: Internal Medicine

## 2017-12-24 VITALS — BP 130/60 | HR 71 | Temp 97.8°F | Ht 66.0 in | Wt 170.0 lb

## 2017-12-24 DIAGNOSIS — J45901 Unspecified asthma with (acute) exacerbation: Secondary | ICD-10-CM

## 2017-12-24 MED ORDER — DOXYCYCLINE HYCLATE 100 MG PO TABS: 100.0000 mg | ORAL_TABLET | Freq: Two times a day (BID) | ORAL | 0 refills | Status: DC

## 2017-12-24 MED ORDER — BUSPIRONE HCL 5 MG PO TABS: 5.0000 mg | ORAL_TABLET | Freq: Two times a day (BID) | ORAL | 1 refills | Status: DC

## 2017-12-24 MED ORDER — METHYLPREDNISOLONE ACETATE 40 MG/ML IJ SUSP
40.0000 mg | Freq: Once | INTRAMUSCULAR | Status: AC
  Administered 2017-12-24: 40 mg via INTRAMUSCULAR

## 2017-12-24 NOTE — Progress Notes (Signed)
   Subjective:    Patient ID: Jocelyn Mendez, female    DOB: March 24, 1930, 84 y.o.   MRN: [id number]  HPI The patient is an 84 YO female coming in for ongoing concerns about cough and SOB. Started about 3-4 weeks ago originally around time of flu shot. She was originally having body aches which are now gone. She denies fevers or chills. Some allergy drainage and stopped taking zyrtec as it was not helping much. Does have SOB and a lot of coughing. Not using albuterol inhaler more although she feels that she should. Energy and appetite levels are still down.   Review of Systems  Constitutional: Positive for activity change, appetite change, chills and fatigue. Negative for fever and unexpected weight change.  HENT: Positive for congestion, postnasal drip, rhinorrhea and sinus pressure. Negative for ear discharge, ear pain, sinus pain, sneezing, sore throat, tinnitus, trouble swallowing and voice change.   Eyes: Negative.   Respiratory: Positive for cough, shortness of breath and wheezing. Negative for chest tightness.   Cardiovascular: Negative.   Gastrointestinal: Negative.   Musculoskeletal: Positive for myalgias.  Neurological: Negative.       Objective:   Physical Exam  Constitutional: She is oriented to person, place, and time. She appears well-developed and well-nourished.  Smelling of cigarettes strongly in the room with patient  HENT:  Head: Normocephalic and atraumatic.  Oropharynx with redness and clear drainage, nose with swollen turbinates, TMs normal bilaterally  Eyes: EOM are normal.  Neck: Normal range of motion. No thyromegaly present.  Cardiovascular: Normal rate and regular rhythm.  Pulmonary/Chest: Effort normal. No respiratory distress. She has wheezes. She has no rales.  Some expiratory wheezing which does not clear with coughing  Abdominal: Soft.  Lymphadenopathy:    She has no cervical adenopathy.  Neurological: She is alert and oriented to person, place, and  time.  Skin: Skin is warm and dry.   Vitals:   12/24/17 1425  BP: 130/60  Pulse: 71  Temp: 97.8 F (36.6 C)  TempSrc: Oral  SpO2: 99%  Weight: 170 lb (77.1 kg)  Height: 5\' 6"  (1.676 m)      Assessment & Plan:  Depo-medrol 40 mg IM given at visit

## 2017-12-24 NOTE — Patient Instructions (Signed)
We have given you a shot and have sent in doxycyline to take 1 pill twice a day for 1 week.

## 2017-12-25 NOTE — Assessment & Plan Note (Signed)
Depo-medrol 40 mg IM given at visit, rx for doxycycline. She claims to be non-smoking but the exam room smelled strongly and we talked about household members not smoking in house or around her at all for her breathing.

## 2018-01-18 ENCOUNTER — Other Ambulatory Visit: Payer: Self-pay | Admitting: Internal Medicine

## 2018-02-22 ENCOUNTER — Other Ambulatory Visit: Payer: Self-pay | Admitting: Endocrinology

## 2018-03-09 ENCOUNTER — Ambulatory Visit: Payer: Medicare Other | Admitting: Endocrinology

## 2018-03-11 ENCOUNTER — Encounter: Payer: Self-pay | Admitting: Endocrinology

## 2018-03-11 ENCOUNTER — Ambulatory Visit (INDEPENDENT_AMBULATORY_CARE_PROVIDER_SITE_OTHER): Payer: Medicare Other | Admitting: Endocrinology

## 2018-03-11 VITALS — BP 134/70 | HR 103 | Ht 66.0 in | Wt 167.2 lb

## 2018-03-11 DIAGNOSIS — N183 Chronic kidney disease, stage 3 (moderate): Secondary | ICD-10-CM

## 2018-03-11 DIAGNOSIS — E1122 Type 2 diabetes mellitus with diabetic chronic kidney disease: Secondary | ICD-10-CM

## 2018-03-11 DIAGNOSIS — Z794 Long term (current) use of insulin: Secondary | ICD-10-CM | POA: Diagnosis not present

## 2018-03-11 LAB — POCT GLYCOSYLATED HEMOGLOBIN (HGB A1C): Hemoglobin A1C: 6.8 % — AB (ref 4.0–5.6)

## 2018-03-11 LAB — BASIC METABOLIC PANEL
BUN: 26 mg/dL — AB (ref 6–23)
CO2: 22 mEq/L (ref 19–32)
Calcium: 9.6 mg/dL (ref 8.4–10.5)
Chloride: 108 mEq/L (ref 96–112)
Creatinine, Ser: 1.66 mg/dL — ABNORMAL HIGH (ref 0.40–1.20)
GFR: 29.34 mL/min — ABNORMAL LOW (ref >60.00)
Glucose, Bld: 179 mg/dL — ABNORMAL HIGH (ref 70–99)
Potassium: 4.2 mEq/L (ref 3.5–5.1)
Sodium: 139 mEq/L (ref 135–145)

## 2018-03-11 LAB — TSH: TSH: 1.82 u[IU]/mL (ref 0.35–4.50)

## 2018-03-11 MED ORDER — INSULIN ISOPHANE HUMAN 100 UNIT/ML KWIKPEN: 60.0000 [IU] | PEN_INJECTOR | SUBCUTANEOUS | 3 refills | Status: DC

## 2018-03-11 MED ORDER — GLUCOSE BLOOD VI STRP: 1.0000 | ORAL_STRIP | Freq: Two times a day (BID) | 11 refills | Status: DC

## 2018-03-11 NOTE — Patient Instructions (Addendum)
check your blood sugar twice a day.  vary the time of day when you check, between before the 3 meals, and at bedtime.  also check if you have symptoms of your blood sugar being too high or too low.  please keep a record of the readings and bring it to your next appointment here.  You can write it on any piece of paper.  please call us sooner if your blood sugar goes below 70, or if you have a lot of readings over 200.   Please continue the same insulin: 60 units each morning.   On this type of insulin schedule, you should eat meals on a regular schedule.  If a meal is missed or significantly delayed, your blood sugar could go low.   Please come back for a follow-up appointment in 2 months.

## 2018-03-11 NOTE — Progress Notes (Signed)
Subjective:    Patient ID: Jocelyn Mendez, female    DOB: [birth year], 85 y.o.   MRN: [id number]  HPI Pt returns for f/u of diabetes mellitus: DM type: Insulin-requiring type 2 Dx'ed: 1988.   Complications: polyneuropathy and renal insuff.   Therapy: insulin since 2012.  GDM: never.  DKA: never Severe hypoglycemia: never.   Pancreatitis: never.   Other: she declines multiple daily injections; she uses pen, due to visual problems; on levemir, she had am hypoglycemia and pm hyperglycemia, so she was changed to QAM NPH.   Interval history:  She has reduced the insulin to 60 units qam, x 2 weeks, due to hypoglycemia.  pt states she feels well in general.  no cbg record, but states since the reduction, cbg's are in the mid-100's.   Past Medical History:  Diagnosis Date  . Acute bronchitis   . Anemia of other chronic disease   . Anxiety state, unspecified   . Chest pain, unspecified   . Diverticulosis of colon (without mention of hemorrhage)   . Esophageal reflux   . Gallstones   . Generalized osteoarthrosis, unspecified site   . Headache(784.0)   . Lumbago   . Neuropathy in diabetes ([place])    bilat LE's  . Osteoporosis   . Other and unspecified hyperlipidemia   . Other B-complex deficiencies   . Type II or unspecified type diabetes mellitus without mention of complication, not stated as uncontrolled   . Unspecified disorder resulting from impaired renal function   . Unspecified essential hypertension   . Unspecified hypothyroidism     Past Surgical History:  Procedure Laterality Date  . APPENDECTOMY    . THYROIDECTOMY      Social History   Socioeconomic History  . Marital status: Divorced    Spouse name: Not on file  . Number of children: 6  . Years of education: Not on file  . Highest education level: Not on file  Occupational History  . Occupation: retired  Scientific laboratory technician  . Financial resource strain: Somewhat hard  . Food insecurity:    Worry: Sometimes true    Inability: Sometimes true  . Transportation needs:    Medical: No    Non-medical: No  Tobacco Use  . Smoking status: Former Smoker    Packs/day: 0.50    Years: 6.00    Pack years: 3.00    Types: Cigarettes    Last attempt to quit: 02/10/1973    Years since quitting: 45.1  . Smokeless tobacco: Former Systems developer    Types: Snuff    Quit date: 04/07/2010  Substance and Sexual Activity  . Alcohol use: No    Alcohol/week: 0.0 standard drinks  . Drug use: No  . Sexual activity: Never  Lifestyle  . Physical activity:    Days per week: 3 days    Minutes per session: 30 min  . Stress: Rather much  Relationships  . Social connections:    Talks on phone: More than three times a week    Gets together: More than three times a week    Attends religious service: Not on file    Active member of club or organization: Not on file    Attends meetings of clubs or organizations: Not on file    Relationship status: Divorced  . Intimate partner violence:    Fear of current or ex partner: Not on file    Emotionally abused: Not on file    Physically abused: Not on file  Forced sexual activity: Not on file  Other Topics Concern  . Not on file  Social History Narrative   1 child passed away at 74 weeks old---?crib death   Dtr; Jocelyn Mendez; Son lives with her Jocelyn Mendez; Jocelyn Mendez; Jocelyn Mendez;     Current Outpatient Medications on File Prior to Visit  Medication Sig Dispense Refill  . albuterol (PROVENTIL HFA;VENTOLIN HFA) 108 (90 BASE) MCG/ACT inhaler Inhale 2 puffs into the lungs every 6 (six) hours as needed for wheezing or shortness of breath. 1 Inhaler 6  . amLODipine (NORVASC) 5 MG tablet Take 2 tablets (10 mg total) by mouth daily. 60 tablet 0  . amLODipine (NORVASC) 5 MG tablet TAKE ONE TABLET BY MOUTH EVERY DAY 90 tablet 1  . aspirin 81 MG tablet Take 81 mg by mouth daily.      . busPIRone (BUSPAR) 5 MG tablet Take 1 tablet (5 mg total) by mouth 2 (two) times daily. 60 tablet 1  .  Cholecalciferol (VITAMIN D3) 2000 UNITS TABS Take 2,000 Units by mouth 2 (two) times daily.     . cyanocobalamin (,VITAMIN B-12,) 1000 MCG/ML injection INJECT 1ML INTO THE MUSCLE ONCE A MONTH 1 mL 11  . fenofibrate 160 MG tablet TAKE ONE TABLET BY MOUTH DAILY 30 tablet 5  . ferrous sulfate 325 (65 FE) MG EC tablet Take 325 mg by mouth 2 (two) times daily.     . furosemide (LASIX) 20 MG tablet TAKE ONE TABLET BY MOUTH EVERY DAY AS NEEDED FOR FLUID OR EDEMA (WEIGHT GAIN OF MORE THAN 3LBS IN ONE DAY OR 5LBS IN TWO DAYS) 30 tablet 3  . gabapentin (NEURONTIN) 100 MG capsule TAKE ONE CAPSULE BY MOUTH THREE TIMES DAILY 90 capsule 3  . GLOBAL EASE INJECT PEN NEEDLES 32G X 4 MM MISC USE AS DIRECTED WITH THE HUMULIN PEN INSULIN (100 DAY SUPPLY) 100 each 2  . guaiFENesin (MUCINEX) 600 MG 12 hr tablet Take 1 tablet (600 mg total) by mouth 2 (two) times daily. 30 tablet 0  . latanoprost (XALATAN) 0.005 % ophthalmic solution Place 1 drop into both eyes at bedtime.  11  . levothyroxine (SYNTHROID, LEVOTHROID) 175 MCG tablet TAKE ONE TABLET BY MOUTH EVERY DAY BEFORE breakfast 90 tablet 3  . NEEDLE, DISP, 25 G (BD DISP NEEDLES) 25G X 5/8" MISC Use as directed to administer  the b12 injection monthly 50 each 2  . polyethylene glycol (MIRALAX / GLYCOLAX) packet Take 17 g by mouth daily. 14 each 0  . rosuvastatin (CRESTOR) 20 MG tablet TAKE 1 TABLET BY MOUTH DAILY 30 tablet 5  . saccharomyces boulardii (FLORASTOR) 250 MG capsule Take 1 capsule (250 mg total) by mouth 2 (two) times daily. 60 capsule 0  . [DISCONTINUED] insulin glargine (LANTUS SOLOSTAR) 100 UNIT/ML injection Inject 100 Units into the skin every morning. 45 mL PRN   No current facility-administered medications on file prior to visit.     Allergies  Allergen Reactions  . Codeine Other (See Comments)    REACTION: change in mental status  . Diphenhydramine Hcl Itching  . Quinine Other (See Comments)    REACTION: abnormal sensations in face  .  Sulfonamide Derivatives Rash    Family History  Problem Relation Age of Onset  . Diabetes Mother   . Cancer Father        unsure ? stomach  . Heart attack Brother        x 3  . Cancer Sister  unsure type  . Lung cancer Brother        smoker  . Heart attack Sister     BP 134/70 (BP Location: Right Arm, Patient Position: Sitting, Cuff Size: Normal)   Pulse (!) 103   Ht 5\' 6"  (1.676 m)   Wt 167 lb 3.2 oz (75.8 kg)   SpO2 95%   BMI 26.99 kg/m    Review of Systems Denies LOC.     Objective:   Physical Exam VITAL SIGNS:  See vs page GENERAL: no distress Pulses: foot pulses are intact bilaterally.   MSK: no deformity of the feet or ankles.  CV: no edema of the legs, but there are bilat vv's.  Skin:  no ulcer on the feet or ankles, but the skin is scaly.  normal color and temp on the feet and ankles. Neuro: sensation is intact to touch on the feet and ankles.   Ext: There is bilateral onychomycosis of the toenails.   Lab Results  Component Value Date   HGBA1C 6.8 (A) 03/11/2018   Lab Results  Component Value Date   CREATININE 1.98 (H) 09/28/2017   BUN 36 (H) 09/28/2017   NA 140 09/28/2017   K 4.3 09/28/2017   CL 109 09/28/2017   CO2 23 09/28/2017       Assessment & Plan:  Insulin-requiring type 2 DM, with PN: well-controlled on reduced insulin dose renal insuff: in this setting, she does not need a PM insulin dose Frail elderly state: in this setting, she is not a candidate for aggressive glycemic control Hypoglycemia: this also limits rx  Patient Instructions  check your blood sugar twice a day.  vary the time of day when you check, between before the 3 meals, and at bedtime.  also check if you have symptoms of your blood sugar being too high or too low.  please keep a record of the readings and bring it to your next appointment here.  You can write it on any piece of paper.  please call us sooner if your blood sugar goes below 70, or if you have a lot of  readings over 200.   Please continue the same insulin: 60 units each morning.   On this type of insulin schedule, you should eat meals on a regular schedule.  If a meal is missed or significantly delayed, your blood sugar could go low.   Please come back for a follow-up appointment in 2 months.

## 2018-03-16 DIAGNOSIS — H401131 Primary open-angle glaucoma, bilateral, mild stage: Secondary | ICD-10-CM | POA: Diagnosis not present

## 2018-03-16 LAB — HM DIABETES EYE EXAM

## 2018-03-17 ENCOUNTER — Encounter: Payer: Self-pay | Admitting: Internal Medicine

## 2018-03-17 NOTE — Progress Notes (Unsigned)
Abstracted and sent to scan  

## 2018-03-18 ENCOUNTER — Other Ambulatory Visit: Payer: Self-pay | Admitting: Internal Medicine

## 2018-04-01 ENCOUNTER — Ambulatory Visit: Payer: Medicare Other | Admitting: Internal Medicine

## 2018-04-12 ENCOUNTER — Ambulatory Visit (INDEPENDENT_AMBULATORY_CARE_PROVIDER_SITE_OTHER): Payer: Medicare Other | Admitting: Internal Medicine

## 2018-04-12 ENCOUNTER — Ambulatory Visit (INDEPENDENT_AMBULATORY_CARE_PROVIDER_SITE_OTHER)
Admission: RE | Admit: 2018-04-12 | Discharge: 2018-04-12 | Disposition: A | Discharge status: Home / Self Care | Payer: Medicare Other | Source: Ambulatory Visit | Attending: Internal Medicine | Admitting: Internal Medicine

## 2018-04-12 ENCOUNTER — Encounter: Payer: Self-pay | Admitting: Internal Medicine

## 2018-04-12 VITALS — BP 110/50 | HR 93 | Temp 98.0°F | Ht 66.0 in | Wt 167.0 lb

## 2018-04-12 DIAGNOSIS — M545 Low back pain, unspecified: Secondary | ICD-10-CM

## 2018-04-12 DIAGNOSIS — G8929 Other chronic pain: Secondary | ICD-10-CM

## 2018-04-12 MED ORDER — LIDOCAINE 5 % EX PTCH: 1.0000 | MEDICATED_PATCH | CUTANEOUS | 0 refills | Status: DC

## 2018-04-12 NOTE — Progress Notes (Signed)
   Subjective:   Patient ID: Jocelyn Mendez, female    DOB: [birth year], 85 y.o.   MRN: [id number]  HPI The patient is an 85 YO female coming in for concerns about back pain. Started in the last couple of months. She is having pain with standing for only a few minutes. She denies numbness or weakness in her legs. Denies pain in her legs from the back. She has the pain only in her low back. She needs to stop and rest. This helps the pain. She is using tylenol which does not help and mostly she is resting when needed. Denies recent falls or injuries.   Review of Systems  Constitutional: Positive for activity change. Negative for appetite change, chills, fatigue, fever and unexpected weight change.  Respiratory: Negative.   Cardiovascular: Negative.   Gastrointestinal: Negative.   Musculoskeletal: Positive for arthralgias, back pain and myalgias. Negative for gait problem and joint swelling.  Skin: Negative.   Neurological: Negative.     Objective:  Physical Exam Constitutional:      Appearance: She is well-developed.  HENT:     Head: Normocephalic and atraumatic.  Neck:     Musculoskeletal: Normal range of motion.  Cardiovascular:     Rate and Rhythm: Normal rate and regular rhythm.  Pulmonary:     Effort: Pulmonary effort is normal. No respiratory distress.     Breath sounds: Normal breath sounds. No wheezing or rales.  Abdominal:     Palpations: Abdomen is soft.  Musculoskeletal:        General: Tenderness present.  Skin:    General: Skin is warm and dry.  Neurological:     Mental Status: She is alert and oriented to person, place, and time.     Coordination: Coordination normal.     Vitals:   04/12/18 1433  BP: (!) 110/50  Pulse: 93  Temp: 98 F (36.7 C)  TempSrc: Oral  SpO2: 98%  Weight: 167 lb (75.8 kg)  Height: 5\' 6"  (1.676 m)    Assessment & Plan:

## 2018-04-12 NOTE — Patient Instructions (Signed)
We are checking the x-ray today and have sent in the patches for the back that you can change daily to help.

## 2018-04-13 ENCOUNTER — Telehealth: Payer: Self-pay

## 2018-04-13 NOTE — Telephone Encounter (Signed)
PA started on CoverMyMeds KEY:  [id number]

## 2018-04-13 NOTE — Assessment & Plan Note (Signed)
Checking x-ray lumbar today for fracture or change from prior. Known arthritis lumbar. Rx for lidoderm patch for pain if needed.

## 2018-04-13 NOTE — Telephone Encounter (Signed)
Pa approved till 02/10/2019

## 2018-04-18 ENCOUNTER — Other Ambulatory Visit: Payer: Self-pay | Admitting: Internal Medicine

## 2018-04-28 ENCOUNTER — Other Ambulatory Visit: Payer: Self-pay | Admitting: Endocrinology

## 2018-05-03 ENCOUNTER — Other Ambulatory Visit: Payer: Self-pay | Admitting: Internal Medicine

## 2018-05-10 ENCOUNTER — Ambulatory Visit (INDEPENDENT_AMBULATORY_CARE_PROVIDER_SITE_OTHER): Payer: Medicare Other | Admitting: Endocrinology

## 2018-05-10 ENCOUNTER — Other Ambulatory Visit: Payer: Self-pay

## 2018-05-10 DIAGNOSIS — Z794 Long term (current) use of insulin: Secondary | ICD-10-CM | POA: Diagnosis not present

## 2018-05-10 DIAGNOSIS — E1140 Type 2 diabetes mellitus with diabetic neuropathy, unspecified: Secondary | ICD-10-CM

## 2018-05-10 DIAGNOSIS — E1122 Type 2 diabetes mellitus with diabetic chronic kidney disease: Secondary | ICD-10-CM | POA: Diagnosis not present

## 2018-05-10 DIAGNOSIS — N183 Chronic kidney disease, stage 3 (moderate): Principal | ICD-10-CM

## 2018-05-10 MED ORDER — INSULIN ISOPHANE HUMAN 100 UNIT/ML KWIKPEN: 70.0000 [IU] | PEN_INJECTOR | SUBCUTANEOUS | 0 refills | Status: DC

## 2018-05-10 NOTE — Patient Instructions (Addendum)
check your blood sugar twice a day.  vary the time of day when you check, between before the 3 meals, and at bedtime.  also check if you have symptoms of your blood sugar being too high or too low.  please keep a record of the readings and bring it to your next appointment here.  You can write it on any piece of paper.  please call us sooner if your blood sugar goes below 70, or if you have a lot of readings over 200.   Please increase the insulin to 70 units each morning.   On this type of insulin schedule, you should eat meals on a regular schedule.  If a meal is missed or significantly delayed, your blood sugar could go low.   Please come back for a follow-up appointment in 2 months.

## 2018-05-10 NOTE — Progress Notes (Addendum)
Subjective:    Patient ID: Jocelyn Mendez, female    DOB: 01/05/1931, 85 y.o.   MRN: [id number]  HPI  telehealth visit today via doxy video visit.  Alternatives to telehealth are presented to this patient, and the patient agrees to the telehealth visit. Pt is advised of the cost of the visit, and agrees to this, also.   Patient is at home, and I am at the office.   Pt returns for f/u of diabetes mellitus: DM type: Insulin-requiring type 2 Dx'ed: 1988.   Complications: polyneuropathy and renal insuff.   Therapy: insulin since 2012.  GDM: never.  DKA: never Severe hypoglycemia: never.   Pancreatitis: never.   Other: she declines multiple daily injections; she uses pen, due to visual problems; on levemir, she had am hypoglycemia and pm hyperglycemia, so she was changed to QAM NPH.   Interval history:  pt states she feels well in general.  no cbg record, but states it varies from from 138-300.  No weight change.  No recent steroids.  She reduced insulin to 60 qam, due to hypoglycemia Past Medical History:  Diagnosis Date  . Acute bronchitis   . Anemia of other chronic disease   . Anxiety state, unspecified   . Chest pain, unspecified   . Diverticulosis of colon (without mention of hemorrhage)   . Esophageal reflux   . Gallstones   . Generalized osteoarthrosis, unspecified site   . Headache(784.0)   . Lumbago   . Neuropathy in diabetes ([place])    bilat LE's  . Osteoporosis   . Other and unspecified hyperlipidemia   . Other B-complex deficiencies   . Type II or unspecified type diabetes mellitus without mention of complication, not stated as uncontrolled   . Unspecified disorder resulting from impaired renal function   . Unspecified essential hypertension   . Unspecified hypothyroidism     Past Surgical History:  Procedure Laterality Date  . APPENDECTOMY    . THYROIDECTOMY      Social History   Socioeconomic History  . Marital status: Divorced    Spouse name: Not on  file  . Number of children: 6  . Years of education: Not on file  . Highest education level: Not on file  Occupational History  . Occupation: retired  Scientific laboratory technician  . Financial resource strain: Somewhat hard  . Food insecurity:    Worry: Sometimes true    Inability: Sometimes true  . Transportation needs:    Medical: No    Non-medical: No  Tobacco Use  . Smoking status: Former Smoker    Packs/day: 0.50    Years: 6.00    Pack years: 3.00    Types: Cigarettes    Last attempt to quit: 02/10/1973    Years since quitting: 45.2  . Smokeless tobacco: Former Systems developer    Types: Snuff    Quit date: 04/07/2010  Substance and Sexual Activity  . Alcohol use: No    Alcohol/week: 0.0 standard drinks  . Drug use: No  . Sexual activity: Never  Lifestyle  . Physical activity:    Days per week: 3 days    Minutes per session: 30 min  . Stress: Rather much  Relationships  . Social connections:    Talks on phone: More than three times a week    Gets together: More than three times a week    Attends religious service: Not on file    Active member of club or organization: Not on file  Attends meetings of clubs or organizations: Not on file    Relationship status: Divorced  . Intimate partner violence:    Fear of current or ex partner: Not on file    Emotionally abused: Not on file    Physically abused: Not on file    Forced sexual activity: Not on file  Other Topics Concern  . Not on file  Social History Narrative   1 child passed away at 53 weeks old---?crib death   Dtr; [name]; Son lives with her [name]; [name]; roger;     Current Outpatient Medications on File Prior to Visit  Medication Sig Dispense Refill  . albuterol (PROVENTIL HFA;VENTOLIN HFA) 108 (90 BASE) MCG/ACT inhaler Inhale 2 puffs into the lungs every 6 (six) hours as needed for wheezing or shortness of breath. 1 Inhaler 6  . amLODipine (NORVASC) 5 MG tablet Take 2 tablets (10 mg total) by mouth daily. (Patient  taking differently: Take 5 mg by mouth daily. Take one tablet daily) 60 tablet 0  . aspirin 81 MG tablet Take 81 mg by mouth daily.      . busPIRone (BUSPAR) 5 MG tablet Take 1 tablet (5 mg total) by mouth 2 (two) times daily. 60 tablet 1  . Cholecalciferol (VITAMIN D3) 2000 UNITS TABS Take 2,000 Units by mouth 2 (two) times daily.     . cyanocobalamin (,VITAMIN B-12,) 1000 MCG/ML injection INJECT 1ML INTO THE MUSCLE ONCE A MONTH 1 mL 11  . fenofibrate 160 MG tablet TAKE 1 TABLET BY MOUTH DAILY 30 tablet 5  . ferrous sulfate 325 (65 FE) MG EC tablet Take 325 mg by mouth 2 (two) times daily.     . furosemide (LASIX) 20 MG tablet TAKE ONE TABLET BY MOUTH EVERY DAY AS NEEDED FOR FLUID OR EDEMA (WEIGHT GAIN OF MORE THAN 3LBS IN ONE DAY OR 5LBS IN TWO DAYS) 30 tablet 3  . gabapentin (NEURONTIN) 100 MG capsule TAKE ONE CAPSULE BY MOUTH THREE TIMES DAILY 90 capsule 3  . GLOBAL EASE INJECT PEN NEEDLES 32G X 4 MM MISC USE AS DIRECTED WITH THE HUMULIN PEN INSULIN (100 DAY SUPPLY) 100 each 2  . glucose blood (ONETOUCH VERIO) test strip 1 each by Other route 2 (two) times daily. And lancets 2/day 120 each 11  . guaiFENesin (MUCINEX) 600 MG 12 hr tablet Take 1 tablet (600 mg total) by mouth 2 (two) times daily. 30 tablet 0  . latanoprost (XALATAN) 0.005 % ophthalmic solution Place 1 drop into both eyes at bedtime.  11  . levothyroxine (SYNTHROID, LEVOTHROID) 175 MCG tablet TAKE ONE TABLET BY MOUTH EVERY DAY BEFORE breakfast 90 tablet 3  . lidocaine (LIDODERM) 5 % APPLY ONE PATCH TO SKIN EVERY DAY. REMOVE AND DISCARD PATCH WITHIN 12 HOURS OR AS DIRECTED BY MD - 30 patch 0  . NEEDLE, DISP, 25 G (BD DISP NEEDLES) 25G X 5/8" MISC Use as directed to administer  the b12 injection monthly 50 each 2  . rosuvastatin (CRESTOR) 20 MG tablet TAKE 1 TABLET BY MOUTH DAILY 30 tablet 5  . saccharomyces boulardii (FLORASTOR) 250 MG capsule Take 1 capsule (250 mg total) by mouth 2 (two) times daily. 60 capsule 0  . polyethylene  glycol (MIRALAX / GLYCOLAX) packet Take 17 g by mouth daily. (Patient not taking: Reported on 05/10/2018) 14 each 0  . [DISCONTINUED] insulin glargine (LANTUS SOLOSTAR) 100 UNIT/ML injection Inject 100 Units into the skin every morning. 45 mL PRN   No  current facility-administered medications on file prior to visit.     Allergies  Allergen Reactions  . Codeine Other (See Comments)    REACTION: change in mental status  . Diphenhydramine Hcl Itching  . Quinine Other (See Comments)    REACTION: abnormal sensations in face  . Sulfonamide Derivatives Rash    Family History  Problem Relation Age of Onset  . Diabetes Mother   . Cancer Father        unsure ? stomach  . Heart attack Brother        x 3  . Cancer Sister        unsure type  . Lung cancer Brother        smoker  . Heart attack Sister      Review of Systems She denies hypoglycemia.      Objective:   Physical Exam   Lab Results  Component Value Date   CREATININE 1.66 (H) 03/11/2018   BUN 26 (H) 03/11/2018   NA 139 03/11/2018   K 4.2 03/11/2018   CL 108 03/11/2018   CO2 22 03/11/2018      Assessment & Plan:  Insulin-requiring type 2 DM, with PN: worse Renal insuff: in this setting, NPH is the preferred QD insulin.    Patient Instructions  check your blood sugar twice a day.  vary the time of day when you check, between before the 3 meals, and at bedtime.  also check if you have symptoms of your blood sugar being too high or too low.  please keep a record of the readings and bring it to your next appointment here.  You can write it on any piece of paper.  please call us sooner if your blood sugar goes below 70, or if you have a lot of readings over 200.   Please increase the insulin to 70 units each morning.   On this type of insulin schedule, you should eat meals on a regular schedule.  If a meal is missed or significantly delayed, your blood sugar could go low.   Please come back for a follow-up appointment in  2 months.

## 2018-05-23 ENCOUNTER — Other Ambulatory Visit: Payer: Self-pay | Admitting: Internal Medicine

## 2018-05-31 ENCOUNTER — Other Ambulatory Visit: Payer: Self-pay | Admitting: Endocrinology

## 2018-05-31 ENCOUNTER — Other Ambulatory Visit: Payer: Self-pay

## 2018-05-31 DIAGNOSIS — E1122 Type 2 diabetes mellitus with diabetic chronic kidney disease: Secondary | ICD-10-CM

## 2018-05-31 DIAGNOSIS — N183 Chronic kidney disease, stage 3 (moderate): Principal | ICD-10-CM

## 2018-05-31 DIAGNOSIS — Z794 Long term (current) use of insulin: Principal | ICD-10-CM

## 2018-05-31 MED ORDER — INSULIN ISOPHANE HUMAN 100 UNIT/ML KWIKPEN: 70.0000 [IU] | PEN_INJECTOR | SUBCUTANEOUS | 0 refills | Status: DC

## 2018-06-02 ENCOUNTER — Telehealth: Payer: Self-pay | Admitting: Endocrinology

## 2018-06-02 ENCOUNTER — Other Ambulatory Visit: Payer: Self-pay

## 2018-06-02 MED ORDER — GLUCOSE BLOOD VI STRP: 1.0000 | ORAL_STRIP | Freq: Two times a day (BID) | 11 refills | Status: DC

## 2018-06-02 NOTE — Telephone Encounter (Signed)
Refill sent.

## 2018-06-02 NOTE — Telephone Encounter (Signed)
MEDICATION: glucose blood (ONETOUCH VERIO) test strip  PHARMACY:  [facility] - [place], [place]  IS THIS A 90 DAY SUPPLY :   IS PATIENT OUT OF MEDICATION: Yes  IF NOT; HOW MUCH IS LEFT:   LAST APPOINTMENT DATE: @4 /20/2020  NEXT APPOINTMENT DATE:@Visit  date not found  DO WE HAVE YOUR PERMISSION TO LEAVE A DETAILED MESSAGE:  OTHER COMMENTS:    **Let patient know to contact pharmacy at the end of the day to make sure medication is ready. **  ** Please notify patient to allow 48-72 hours to process**  **Encourage patient to contact the pharmacy for refills or they can request refills through [facility]**

## 2018-06-07 ENCOUNTER — Telehealth: Payer: Self-pay | Admitting: Endocrinology

## 2018-06-07 ENCOUNTER — Other Ambulatory Visit: Payer: Self-pay

## 2018-06-07 DIAGNOSIS — N183 Chronic kidney disease, stage 3 (moderate): Principal | ICD-10-CM

## 2018-06-07 DIAGNOSIS — E1122 Type 2 diabetes mellitus with diabetic chronic kidney disease: Secondary | ICD-10-CM

## 2018-06-07 DIAGNOSIS — Z794 Long term (current) use of insulin: Principal | ICD-10-CM

## 2018-06-07 MED ORDER — ONETOUCH DELICA LANCETS 33G MISC: 1.0000 | Freq: Four times a day (QID) | 11 refills | Status: DC

## 2018-06-07 MED ORDER — GLUCOSE BLOOD VI STRP: 1.0000 | ORAL_STRIP | Freq: Four times a day (QID) | 11 refills | Status: DC

## 2018-06-07 NOTE — Telephone Encounter (Signed)
Pharmacy called and they need updated RX for test strips to reflects that patient is testing 4x per day.  The RX they have on file states patient only to test 2x per day.  [id number]

## 2018-06-07 NOTE — Telephone Encounter (Signed)
Patients son came into the house today wondering why the pharmacy is only giving his mother 50 strips instead of the 120 each that is stated. States that she is testing 4 times a day, that is what Dr. Loanne Drilling stated at there last visit.  Please Advise, Thanks

## 2018-06-07 NOTE — Telephone Encounter (Signed)
E-Prescribing Status: Receipt confirmed by pharmacy (06/07/2018 1:07 PM EDT)

## 2018-06-16 ENCOUNTER — Other Ambulatory Visit: Payer: Self-pay | Admitting: Endocrinology

## 2018-06-16 ENCOUNTER — Other Ambulatory Visit: Payer: Self-pay | Admitting: Internal Medicine

## 2018-06-28 ENCOUNTER — Other Ambulatory Visit: Payer: Self-pay | Admitting: Endocrinology

## 2018-06-28 DIAGNOSIS — N183 Chronic kidney disease, stage 3 unspecified: Secondary | ICD-10-CM

## 2018-06-28 DIAGNOSIS — E1122 Type 2 diabetes mellitus with diabetic chronic kidney disease: Secondary | ICD-10-CM

## 2018-07-07 ENCOUNTER — Other Ambulatory Visit: Payer: Self-pay | Admitting: *Deleted

## 2018-07-07 MED ORDER — BUSPIRONE HCL 5 MG PO TABS: 5.0000 mg | ORAL_TABLET | Freq: Two times a day (BID) | ORAL | 1 refills | Status: DC

## 2018-07-08 ENCOUNTER — Other Ambulatory Visit: Payer: Self-pay | Admitting: Endocrinology

## 2018-07-08 DIAGNOSIS — N183 Chronic kidney disease, stage 3 unspecified: Secondary | ICD-10-CM

## 2018-07-08 DIAGNOSIS — E1122 Type 2 diabetes mellitus with diabetic chronic kidney disease: Secondary | ICD-10-CM

## 2018-07-09 ENCOUNTER — Other Ambulatory Visit: Payer: Self-pay

## 2018-07-09 ENCOUNTER — Encounter: Payer: Self-pay | Admitting: Endocrinology

## 2018-07-09 ENCOUNTER — Ambulatory Visit (INDEPENDENT_AMBULATORY_CARE_PROVIDER_SITE_OTHER): Payer: Medicare Other | Admitting: Endocrinology

## 2018-07-09 VITALS — BP 144/62 | HR 93 | Temp 98.0°F | Wt 165.0 lb

## 2018-07-09 DIAGNOSIS — Z794 Long term (current) use of insulin: Secondary | ICD-10-CM

## 2018-07-09 DIAGNOSIS — E1122 Type 2 diabetes mellitus with diabetic chronic kidney disease: Secondary | ICD-10-CM

## 2018-07-09 DIAGNOSIS — N183 Chronic kidney disease, stage 3 (moderate): Secondary | ICD-10-CM

## 2018-07-09 LAB — POCT GLYCOSYLATED HEMOGLOBIN (HGB A1C): Hemoglobin A1C: 9.2 % — AB (ref 4.0–5.6)

## 2018-07-09 MED ORDER — INSULIN ISOPHANE HUMAN 100 UNIT/ML KWIKPEN: 80.0000 [IU] | PEN_INJECTOR | SUBCUTANEOUS | 11 refills | Status: DC

## 2018-07-09 MED ORDER — LEVOTHYROXINE SODIUM 137 MCG PO TABS: 137.0000 ug | ORAL_TABLET | Freq: Every day | ORAL | 3 refills | Status: DC

## 2018-07-09 NOTE — Progress Notes (Signed)
Subjective:    Patient ID: Jocelyn Mendez, female    DOB: 07-02-1930, 85 y.o.   MRN: [id number]  HPI Pt returns for f/u of diabetes mellitus:  DM type: Insulin-requiring type 2.  Dx'ed: 1988.   Complications: polyneuropathy and renal insuff.   Therapy: insulin since 2012.  GDM: never.  DKA: never Severe hypoglycemia: never.   Pancreatitis: never.   Other: she declines multiple daily injections; she uses pen, due to visual problems; on levemir, she had am hypoglycemia and pm hyperglycemia, so she was changed to QAM NPH.   Interval history:  pt states she feels well in general.  no cbg record, but states it varies from from 113-350.  There is no trend throughout the day.  No weight change.  No recent steroids.  She takes 70 units qam.   Past Medical History:  Diagnosis Date  . Acute bronchitis   . Anemia of other chronic disease   . Anxiety state, unspecified   . Chest pain, unspecified   . Diverticulosis of colon (without mention of hemorrhage)   . Esophageal reflux   . Gallstones   . Generalized osteoarthrosis, unspecified site   . Headache(784.0)   . Lumbago   . Neuropathy in diabetes ([place])    bilat LE's  . Osteoporosis   . Other and unspecified hyperlipidemia   . Other B-complex deficiencies   . Type II or unspecified type diabetes mellitus without mention of complication, not stated as uncontrolled   . Unspecified disorder resulting from impaired renal function   . Unspecified essential hypertension   . Unspecified hypothyroidism     Past Surgical History:  Procedure Laterality Date  . APPENDECTOMY    . THYROIDECTOMY      Social History   Socioeconomic History  . Marital status: Divorced    Spouse name: Not on file  . Number of children: 6  . Years of education: Not on file  . Highest education level: Not on file  Occupational History  . Occupation: retired  Scientific laboratory technician  . Financial resource strain: Somewhat hard  . Food insecurity:    Worry:  Sometimes true    Inability: Sometimes true  . Transportation needs:    Medical: No    Non-medical: No  Tobacco Use  . Smoking status: Former Smoker    Packs/day: 0.50    Years: 6.00    Pack years: 3.00    Types: Cigarettes    Last attempt to quit: 02/10/1973    Years since quitting: 45.4  . Smokeless tobacco: Former Systems developer    Types: Snuff    Quit date: 04/07/2010  Substance and Sexual Activity  . Alcohol use: No    Alcohol/week: 0.0 standard drinks  . Drug use: No  . Sexual activity: Never  Lifestyle  . Physical activity:    Days per week: 3 days    Minutes per session: 30 min  . Stress: Rather much  Relationships  . Social connections:    Talks on phone: More than three times a week    Gets together: More than three times a week    Attends religious service: Not on file    Active member of club or organization: Not on file    Attends meetings of clubs or organizations: Not on file    Relationship status: Divorced  . Intimate partner violence:    Fear of current or ex partner: Not on file    Emotionally abused: Not on file  Physically abused: Not on file    Forced sexual activity: Not on file  Other Topics Concern  . Not on file  Social History Narrative   1 child passed away at 9 weeks old---?crib death   Dtr; [name]; Son lives with her [name]; [name]; roger;     Current Outpatient Medications on File Prior to Visit  Medication Sig Dispense Refill  . albuterol (PROVENTIL HFA;VENTOLIN HFA) 108 (90 BASE) MCG/ACT inhaler Inhale 2 puffs into the lungs every 6 (six) hours as needed for wheezing or shortness of breath. 1 Inhaler 6  . amLODipine (NORVASC) 5 MG tablet Take 2 tablets (10 mg total) by mouth daily. (Patient taking differently: Take 5 mg by mouth daily. Take one tablet daily) 60 tablet 0  . aspirin 81 MG tablet Take 81 mg by mouth daily.      . busPIRone (BUSPAR) 5 MG tablet Take 1 tablet (5 mg total) by mouth 2 (two) times daily. 60 tablet 1  .  Cholecalciferol (VITAMIN D3) 2000 UNITS TABS Take 2,000 Units by mouth 2 (two) times daily.     . cyanocobalamin (,VITAMIN B-12,) 1000 MCG/ML injection INJECT 1ML INTO THE MUSCLE ONCE A MONTH 1 mL 11  . fenofibrate 160 MG tablet TAKE 1 TABLET BY MOUTH DAILY 30 tablet 5  . ferrous sulfate 325 (65 FE) MG EC tablet Take 325 mg by mouth 2 (two) times daily.     . furosemide (LASIX) 20 MG tablet TAKE ONE TABLET BY MOUTH EVERY DAY AS NEEDED FOR FLUID OR EDEMA (WEIGHT GAIN OF MORE THAN 3LBS IN ONE DAY OR 5LBS IN TWO DAYS) 30 tablet 3  . gabapentin (NEURONTIN) 100 MG capsule TAKE ONE CAPSULE BY MOUTH THREE TIMES DAILY 90 capsule 3  . GLOBAL EASE INJECT PEN NEEDLES 32G X 4 MM MISC USE AS DIRECTED WITH THE HUMULIN PEN INSULIN (100 DAY SUPPLY) 100 each 2  . glucose blood (ONETOUCH VERIO) test strip 1 each by Other route 4 (four) times daily. Use one strip to monitor glucose levels qid; DX E11.22 120 each 11  . guaiFENesin (MUCINEX) 600 MG 12 hr tablet Take 1 tablet (600 mg total) by mouth 2 (two) times daily. 30 tablet 0  . latanoprost (XALATAN) 0.005 % ophthalmic solution Place 1 drop into both eyes at bedtime.  11  . lidocaine (LIDODERM) 5 % APPLY ONE PATCH TO SKIN EVERY DAY. REMOVE AND DISCARD PATCH WITHIN 12 HOURS OR AS DIRECTED BY MD - 30 patch 0  . NEEDLE, DISP, 25 G (BD DISP NEEDLES) 25G X 5/8" MISC Use as directed to administer  the b12 injection monthly 50 each 2  . OneTouch Delica Lancets 65K MISC 1 each by Does not apply route 4 (four) times daily. Use one lancet to monitor glucose levels qid; DX E11.22 100 each 11  . polyethylene glycol (MIRALAX / GLYCOLAX) packet Take 17 g by mouth daily. 14 each 0  . rosuvastatin (CRESTOR) 20 MG tablet TAKE 1 TABLET BY MOUTH DAILY 30 tablet 5  . saccharomyces boulardii (FLORASTOR) 250 MG capsule Take 1 capsule (250 mg total) by mouth 2 (two) times daily. 60 capsule 0  . [DISCONTINUED] insulin glargine (LANTUS SOLOSTAR) 100 UNIT/ML injection Inject 100 Units into  the skin every morning. 45 mL PRN   No current facility-administered medications on file prior to visit.     Allergies  Allergen Reactions  . Codeine Other (See Comments)    REACTION: change in mental status  .  Diphenhydramine Hcl Itching  . Quinine Other (See Comments)    REACTION: abnormal sensations in face  . Sulfonamide Derivatives Rash    Family History  Problem Relation Age of Onset  . Diabetes Mother   . Cancer Father        unsure ? stomach  . Heart attack Brother        x 3  . Cancer Sister        unsure type  . Lung cancer Brother        smoker  . Heart attack Sister     BP (!) 144/62 (BP Location: Right Arm, Patient Position: Sitting, Cuff Size: Normal)   Pulse 93   Temp 98 F (36.7 C) (Oral)   Wt 165 lb (74.8 kg)   SpO2 98%   BMI 26.63 kg/m    Review of Systems She denies hypoglycemia    Objective:   Physical Exam VITAL SIGNS:  See vs page GENERAL: no distress Pulses: dorsalis pedis intact bilat.   MSK: no deformity of the feet CV: trace bilat leg edema Skin:  no ulcer on the feet.  normal color and temp on the feet.   Neuro: sensation is intact to touch on the feet.  Ext: There is bilateral onychomycosis of the toenails.    Lab Results  Component Value Date   TSH 1.82 03/11/2018    Lab Results  Component Value Date   CREATININE 1.66 (H) 03/11/2018   BUN 26 (H) 03/11/2018   NA 139 03/11/2018   K 4.2 03/11/2018   CL 108 03/11/2018   CO2 22 03/11/2018   Lab Results  Component Value Date   HGBA1C 9.2 (A) 07/09/2018      Assessment & Plan:  HTN: is noted today.  Insulin-requiring type 2 DM, with PN: worse.  Renal failure: in this setting, she needs an intermediate-release qd insulin.   Patient Instructions  Your blood pressure is high today.  Please see your primary care provider soon, to have it rechecked check your blood sugar twice a day.  vary the time of day when you check, between before the 3 meals, and at bedtime.  also  check if you have symptoms of your blood sugar being too high or too low.  please keep a record of the readings and bring it to your next appointment here.  You can write it on any piece of paper.  please call us sooner if your blood sugar goes below 70, or if you have a lot of readings over 200.   Please increase the insulin to 80 units each morning.   On this type of insulin schedule, you should eat meals on a regular schedule.  If a meal is missed or significantly delayed, your blood sugar could go low.   Please come back for a follow-up appointment in 2 months.

## 2018-07-09 NOTE — Patient Instructions (Addendum)
Your blood pressure is high today.  Please see your primary care provider soon, to have it rechecked check your blood sugar twice a day.  vary the time of day when you check, between before the 3 meals, and at bedtime.  also check if you have symptoms of your blood sugar being too high or too low.  please keep a record of the readings and bring it to your next appointment here.  You can write it on any piece of paper.  please call us sooner if your blood sugar goes below 70, or if you have a lot of readings over 200.   Please increase the insulin to 80 units each morning.   On this type of insulin schedule, you should eat meals on a regular schedule.  If a meal is missed or significantly delayed, your blood sugar could go low.   Please come back for a follow-up appointment in 2 months.

## 2018-07-13 ENCOUNTER — Encounter: Payer: Self-pay | Admitting: Internal Medicine

## 2018-07-13 ENCOUNTER — Other Ambulatory Visit: Payer: Self-pay

## 2018-07-13 ENCOUNTER — Ambulatory Visit (INDEPENDENT_AMBULATORY_CARE_PROVIDER_SITE_OTHER): Payer: Medicare Other | Admitting: Internal Medicine

## 2018-07-13 VITALS — BP 130/70 | HR 84 | Temp 98.2°F | Ht 66.0 in | Wt 162.0 lb

## 2018-07-13 DIAGNOSIS — M545 Low back pain: Secondary | ICD-10-CM

## 2018-07-13 DIAGNOSIS — N183 Chronic kidney disease, stage 3 unspecified: Secondary | ICD-10-CM

## 2018-07-13 DIAGNOSIS — I10 Essential (primary) hypertension: Secondary | ICD-10-CM | POA: Diagnosis not present

## 2018-07-13 DIAGNOSIS — G8929 Other chronic pain: Secondary | ICD-10-CM | POA: Diagnosis not present

## 2018-07-13 LAB — BASIC METABOLIC PANEL
BUN/Creatinine Ratio: 21 (calc) (ref 6–22)
BUN: 34 mg/dL — ABNORMAL HIGH (ref 7–25)
CO2: 22 mmol/L (ref 20–32)
Calcium: 9.8 mg/dL (ref 8.6–10.4)
Chloride: 108 mmol/L (ref 98–110)
Creat: 1.62 mg/dL — ABNORMAL HIGH (ref 0.60–0.88)
Glucose, Bld: 213 mg/dL — ABNORMAL HIGH (ref 65–99)
Potassium: 4.5 mmol/L (ref 3.5–5.3)
Sodium: 139 mmol/L (ref 135–146)

## 2018-07-13 LAB — T4, FREE: Free T4: 1.2 ng/dL (ref 0.8–1.8)

## 2018-07-13 LAB — TSH: TSH: 4.29 mIU/L (ref 0.40–4.50)

## 2018-07-13 MED ORDER — BUSPIRONE HCL 5 MG PO TABS: 5.0000 mg | ORAL_TABLET | Freq: Two times a day (BID) | ORAL | 5 refills | Status: DC

## 2018-07-13 NOTE — Progress Notes (Signed)
   Subjective:   Patient ID: Jocelyn Mendez, female    DOB: [birth year], 85 y.o.   MRN: [id number]  HPI The patient is an 85 YO female coming in for follow up of blood pressure (taking amlodipine and lasix, denies chest pains or headaches, denies side effects) and low back pain (using tylenol and the patches for the pain and these are helping some, denies new falls or injury or overuse, does have to rest while standing for any length of time and cannot do dishes without stopping to rest) and anxiety (using buspar and this is doing well, denies SI/HI, denies depression, is staying home and rides around in the car without getting out sometimes).   Review of Systems  Constitutional: Negative.   HENT: Negative.   Eyes: Negative.   Respiratory: Positive for shortness of breath. Negative for cough and chest tightness.   Cardiovascular: Negative for chest pain, palpitations and leg swelling.  Gastrointestinal: Negative for abdominal distention, abdominal pain, constipation, diarrhea, nausea and vomiting.  Musculoskeletal: Positive for arthralgias, back pain and myalgias.  Skin: Negative.   Neurological: Negative.   Psychiatric/Behavioral: Negative.     Objective:  Physical Exam Constitutional:      Appearance: She is well-developed. She is ill-appearing.     Comments: Chronically ill appearing  HENT:     Head: Normocephalic and atraumatic.  Neck:     Musculoskeletal: Normal range of motion.  Cardiovascular:     Rate and Rhythm: Normal rate and regular rhythm.  Pulmonary:     Effort: Pulmonary effort is normal. No respiratory distress.     Breath sounds: No wheezing or rales.     Comments: Smelling of cigarette smoke Abdominal:     General: Bowel sounds are normal. There is no distension.     Palpations: Abdomen is soft.     Tenderness: There is no abdominal tenderness. There is no rebound.  Skin:    General: Skin is warm and dry.  Neurological:     Mental Status: She is alert and  oriented to person, place, and time.     Coordination: Coordination normal.     Comments: Slow gait but steady     Vitals:   07/13/18 1512  BP: 130/70  Pulse: 84  Temp: 98.2 F (36.8 C)  TempSrc: Oral  SpO2: 94%  Weight: 162 lb (73.5 kg)  Height: 5\' 6"  (1.676 m)    Assessment & Plan:

## 2018-07-13 NOTE — Assessment & Plan Note (Signed)
Stable on last labs from endo. BP at goal, diabetes not under good control right now and she is working on that.

## 2018-07-13 NOTE — Patient Instructions (Signed)
We have sent in the refills.  

## 2018-07-13 NOTE — Assessment & Plan Note (Signed)
BP at goal, taking amlodipine and lasix. Complicated by CKD stage 3. Stable on recent labs from endo.

## 2018-07-13 NOTE — Assessment & Plan Note (Signed)
Refill lidocaine as needed and reviewed max tylenol dosage.

## 2018-07-16 ENCOUNTER — Other Ambulatory Visit: Payer: Self-pay | Admitting: Endocrinology

## 2018-07-18 ENCOUNTER — Other Ambulatory Visit: Payer: Self-pay | Admitting: Internal Medicine

## 2018-07-18 ENCOUNTER — Other Ambulatory Visit: Payer: Self-pay | Admitting: Endocrinology

## 2018-09-08 ENCOUNTER — Encounter: Payer: Self-pay | Admitting: Endocrinology

## 2018-09-08 ENCOUNTER — Ambulatory Visit (INDEPENDENT_AMBULATORY_CARE_PROVIDER_SITE_OTHER): Payer: Medicare Other | Admitting: Endocrinology

## 2018-09-08 ENCOUNTER — Other Ambulatory Visit: Payer: Self-pay

## 2018-09-08 VITALS — BP 152/58 | HR 88 | Ht 66.0 in | Wt 162.8 lb

## 2018-09-08 DIAGNOSIS — N183 Chronic kidney disease, stage 3 (moderate): Secondary | ICD-10-CM

## 2018-09-08 DIAGNOSIS — Z794 Long term (current) use of insulin: Secondary | ICD-10-CM

## 2018-09-08 DIAGNOSIS — E1122 Type 2 diabetes mellitus with diabetic chronic kidney disease: Secondary | ICD-10-CM

## 2018-09-08 LAB — POCT GLYCOSYLATED HEMOGLOBIN (HGB A1C): Hemoglobin A1C: 7.3 % — AB (ref 4.0–5.6)

## 2018-09-08 MED ORDER — HUMULIN N KWIKPEN 100 UNIT/ML ~~LOC~~ SUPN: 75.0000 [IU] | PEN_INJECTOR | SUBCUTANEOUS | 11 refills | Status: DC

## 2018-09-08 NOTE — Patient Instructions (Addendum)
Your blood pressure is high today.  Please see your primary care provider soon, to have it rechecked check your blood sugar twice a day.  vary the time of day when you check, between before the 3 meals, and at bedtime.  also check if you have symptoms of your blood sugar being too high or too low.  please keep a record of the readings and bring it to your next appointment here.  You can write it on any piece of paper.  please call us sooner if your blood sugar goes below 70, or if you have a lot of readings over 200.   Please reduce the insulin to 75 units each morning.   On this type of insulin schedule, you should eat meals on a regular schedule.  If a meal is missed or significantly delayed, your blood sugar could go low.   Please come back for a follow-up appointment in 2 months.  Marland Kitchen

## 2018-09-08 NOTE — Progress Notes (Signed)
Subjective:    Patient ID: Jocelyn Mendez, female    DOB: 1930-03-27, 85 y.o.   MRN: [id number]  HPI Pt returns for f/u of diabetes mellitus:  DM type: Insulin-requiring type 2.  Dx'ed: 1988.   Complications: polyneuropathy and renal insuff.   Therapy: insulin since 2012.  GDM: never.  DKA: never Severe hypoglycemia: never.   Pancreatitis: never.   Other: she declines multiple daily injections; she uses pen, due to visual problems; on levemir, she had am hypoglycemia and pm hyperglycemia, so she was changed to QAM NPH.   Interval history:  pt states she feels well in general.  no cbg record, but states it varies from from 65-220.  It is in general higher as the day goes on.   No recent steroids.   Past Medical History:  Diagnosis Date  . Acute bronchitis   . Anemia of other chronic disease   . Anxiety state, unspecified   . Chest pain, unspecified   . Diverticulosis of colon (without mention of hemorrhage)   . Esophageal reflux   . Gallstones   . Generalized osteoarthrosis, unspecified site   . Headache(784.0)   . Lumbago   . Neuropathy in diabetes ([place])    bilat LE's  . Osteoporosis   . Other and unspecified hyperlipidemia   . Other B-complex deficiencies   . Type II or unspecified type diabetes mellitus without mention of complication, not stated as uncontrolled   . Unspecified disorder resulting from impaired renal function   . Unspecified essential hypertension   . Unspecified hypothyroidism     Past Surgical History:  Procedure Laterality Date  . APPENDECTOMY    . THYROIDECTOMY      Social History   Socioeconomic History  . Marital status: Divorced    Spouse name: Not on file  . Number of children: 6  . Years of education: Not on file  . Highest education level: Not on file  Occupational History  . Occupation: retired  Scientific laboratory technician  . Financial resource strain: Somewhat hard  . Food insecurity    Worry: Sometimes true    Inability: Sometimes true   . Transportation needs    Medical: No    Non-medical: No  Tobacco Use  . Smoking status: Former Smoker    Packs/day: 0.50    Years: 6.00    Pack years: 3.00    Types: Cigarettes    Quit date: 02/10/1973    Years since quitting: 45.6  . Smokeless tobacco: Former Systems developer    Types: Snuff    Quit date: 04/07/2010  Substance and Sexual Activity  . Alcohol use: No    Alcohol/week: 0.0 standard drinks  . Drug use: No  . Sexual activity: Never  Lifestyle  . Physical activity    Days per week: 3 days    Minutes per session: 30 min  . Stress: Rather much  Relationships  . Social connections    Talks on phone: More than three times a week    Gets together: More than three times a week    Attends religious service: Not on file    Active member of club or organization: Not on file    Attends meetings of clubs or organizations: Not on file    Relationship status: Divorced  . Intimate partner violence    Fear of current or ex partner: Not on file    Emotionally abused: Not on file    Physically abused: Not on file  Forced sexual activity: Not on file  Other Topics Concern  . Not on file  Social History Narrative   1 child passed away at 32 weeks old---?crib death   Dtr; [name]; Son lives with her [name]; [name]; roger;     Current Outpatient Medications on File Prior to Visit  Medication Sig Dispense Refill  . albuterol (PROVENTIL HFA;VENTOLIN HFA) 108 (90 BASE) MCG/ACT inhaler Inhale 2 puffs into the lungs every 6 (six) hours as needed for wheezing or shortness of breath. 1 Inhaler 6  . amLODipine (NORVASC) 5 MG tablet TAKE 1 TABLET BY MOUTH EVERY DAY 90 tablet 1  . aspirin 81 MG tablet Take 81 mg by mouth daily.      . busPIRone (BUSPAR) 5 MG tablet Take 1 tablet (5 mg total) by mouth 2 (two) times daily. 60 tablet 5  . Cholecalciferol (VITAMIN D3) 2000 UNITS TABS Take 2,000 Units by mouth 2 (two) times daily.     . cyanocobalamin (,VITAMIN B-12,) 1000 MCG/ML injection  INJECT 1ML INTO THE MUSCLE ONCE A MONTH 1 mL 11  . fenofibrate 160 MG tablet TAKE 1 TABLET BY MOUTH DAILY 30 tablet 5  . ferrous sulfate 325 (65 FE) MG EC tablet Take 325 mg by mouth 2 (two) times daily.     . furosemide (LASIX) 20 MG tablet TAKE ONE TABLET BY MOUTH EVERY DAY AS NEEDED FOR FLUID OR EDEMA (WEIGHT GAIN OF MORE THAN 3LBS IN ONE DAY OR 5LBS IN TWO DAYS) 30 tablet 3  . gabapentin (NEURONTIN) 100 MG capsule TAKE ONE CAPSULE BY MOUTH THREE TIMES DAILY 90 capsule 3  . GLOBAL EASE INJECT PEN NEEDLES 32G X 4 MM MISC USE AS DIRECTED WITH THE HUMULIN PEN INSULIN (100 DAY SUPPLY) 100 each 2  . glucose blood (ONETOUCH VERIO) test strip 1 each by Other route 4 (four) times daily. Use one strip to monitor glucose levels qid; DX E11.22 120 each 11  . guaiFENesin (MUCINEX) 600 MG 12 hr tablet Take 1 tablet (600 mg total) by mouth 2 (two) times daily. 30 tablet 0  . latanoprost (XALATAN) 0.005 % ophthalmic solution Place 1 drop into both eyes at bedtime.  11  . levothyroxine (SYNTHROID) 137 MCG tablet Take 1 tablet (137 mcg total) by mouth daily before breakfast. 90 tablet 3  . lidocaine (LIDODERM) 5 % APPLY ONE PATCH TO SKIN EVERY DAY. REMOVE AND DISCARD PATCH WITHIN 12 HOURS OR AS DIRECTED BY MD - 30 patch 0  . NEEDLE, DISP, 25 G (BD DISP NEEDLES) 25G X 5/8" MISC Use as directed to administer  the b12 injection monthly 50 each 2  . OneTouch Delica Lancets 26Z MISC 1 each by Does not apply route 4 (four) times daily. Use one lancet to monitor glucose levels qid; DX E11.22 100 each 11  . polyethylene glycol (MIRALAX / GLYCOLAX) packet Take 17 g by mouth daily. 14 each 0  . rosuvastatin (CRESTOR) 20 MG tablet TAKE 1 TABLET BY MOUTH DAILY 30 tablet 5  . saccharomyces boulardii (FLORASTOR) 250 MG capsule Take 1 capsule (250 mg total) by mouth 2 (two) times daily. 60 capsule 0  . [DISCONTINUED] insulin glargine (LANTUS SOLOSTAR) 100 UNIT/ML injection Inject 100 Units into the skin every morning. 45 mL PRN    No current facility-administered medications on file prior to visit.     Allergies  Allergen Reactions  . Codeine Other (See Comments)    REACTION: change in mental status  . Diphenhydramine  Hcl Itching  . Quinine Other (See Comments)    REACTION: abnormal sensations in face  . Sulfonamide Derivatives Rash    Family History  Problem Relation Age of Onset  . Diabetes Mother   . Cancer Father        unsure ? stomach  . Heart attack Brother        x 3  . Cancer Sister        unsure type  . Lung cancer Brother        smoker  . Heart attack Sister     BP (!) 152/58 (BP Location: Left Arm, Patient Position: Sitting, Cuff Size: Large)   Pulse 88   Ht 5\' 6"  (1.676 m)   Wt 162 lb 12.8 oz (73.8 kg)   SpO2 97%   BMI 26.28 kg/m   Review of Systems Denies LOC    Objective:   Physical Exam VITAL SIGNS:  See vs page GENERAL: no distress Pulses: dorsalis pedis intact bilat.   MSK: no deformity of the feet CV: no leg edema, but there are bilat vv's Skin:  no ulcer on the feet.  normal color and temp on the feet. Neuro: sensation is intact to touch on the feet Ext: there is bilateral onychomycosis of the toenails  Lab Results  Component Value Date   HGBA1C 7.3 (A) 09/08/2018   Lab Results  Component Value Date   CREATININE 1.62 (H) 07/09/2018   BUN 34 (H) 07/09/2018   NA 139 07/09/2018   K 4.5 07/09/2018   CL 108 07/09/2018   CO2 22 07/09/2018       Assessment & Plan:  HTN: is noted today.  Insulin-requiring type 2 DM, with PN.  Renal failure: she does not need a PM insulin dose.    Patient Instructions  Your blood pressure is high today.  Please see your primary care provider soon, to have it rechecked check your blood sugar twice a day.  vary the time of day when you check, between before the 3 meals, and at bedtime.  also check if you have symptoms of your blood sugar being too high or too low.  please keep a record of the readings and bring it to your next  appointment here.  You can write it on any piece of paper.  please call us sooner if your blood sugar goes below 70, or if you have a lot of readings over 200.   Please reduce the insulin to 75 units each morning.   On this type of insulin schedule, you should eat meals on a regular schedule.  If a meal is missed or significantly delayed, your blood sugar could go low.   Please come back for a follow-up appointment in 2 months.  Marland Kitchen

## 2018-09-15 ENCOUNTER — Other Ambulatory Visit: Payer: Self-pay | Admitting: Endocrinology

## 2018-09-20 DIAGNOSIS — H401131 Primary open-angle glaucoma, bilateral, mild stage: Secondary | ICD-10-CM | POA: Diagnosis not present

## 2018-10-17 ENCOUNTER — Other Ambulatory Visit: Payer: Self-pay | Admitting: Internal Medicine

## 2018-11-08 ENCOUNTER — Other Ambulatory Visit: Payer: Self-pay

## 2018-11-10 ENCOUNTER — Encounter: Payer: Self-pay | Admitting: Endocrinology

## 2018-11-10 ENCOUNTER — Ambulatory Visit (INDEPENDENT_AMBULATORY_CARE_PROVIDER_SITE_OTHER): Payer: Medicare Other | Admitting: Endocrinology

## 2018-11-10 ENCOUNTER — Other Ambulatory Visit: Payer: Self-pay

## 2018-11-10 VITALS — BP 132/42 | HR 100 | Ht 66.0 in | Wt 164.2 lb

## 2018-11-10 DIAGNOSIS — Z23 Encounter for immunization: Secondary | ICD-10-CM | POA: Diagnosis not present

## 2018-11-10 DIAGNOSIS — N183 Chronic kidney disease, stage 3 unspecified: Secondary | ICD-10-CM

## 2018-11-10 DIAGNOSIS — E1122 Type 2 diabetes mellitus with diabetic chronic kidney disease: Secondary | ICD-10-CM | POA: Diagnosis not present

## 2018-11-10 DIAGNOSIS — Z794 Long term (current) use of insulin: Secondary | ICD-10-CM

## 2018-11-10 LAB — POCT GLYCOSYLATED HEMOGLOBIN (HGB A1C): Hemoglobin A1C: 7.6 % — AB (ref 4.0–5.6)

## 2018-11-10 NOTE — Patient Instructions (Addendum)
check your blood sugar twice a day.  vary the time of day when you check, between before the 3 meals, and at bedtime.  also check if you have symptoms of your blood sugar being too high or too low.  please keep a record of the readings and bring it to your next appointment here.  You can write it on any piece of paper.  please call us sooner if your blood sugar goes below 70, or if you have a lot of readings over 200.   Please continue the same insulin. On this type of insulin schedule, you should eat meals on a regular schedule.  If a meal is missed or significantly delayed, your blood sugar could go low.   Please come back for a follow-up appointment in 3-4 months.

## 2018-11-10 NOTE — Progress Notes (Signed)
Subjective:    Patient ID: Jocelyn Mendez, female    DOB: 28-Oct-1930, 85 y.o.   MRN: [id number]  HPI Pt returns for f/u of diabetes mellitus:  DM type: Insulin-requiring type 2.  Dx'ed: 1988.   Complications: polyneuropathy and renal insuff.   Therapy: insulin since 2012.  GDM: never.  DKA: never Severe hypoglycemia: never.   Pancreatitis: never.   Other: she declines multiple daily injections; she uses pen, due to visual problems; on levemir, she had am hypoglycemia and pm hyperglycemia, so she was changed to QAM NPH.   Interval history:  pt states she feels well in general.  no cbg record, but states it varies from from 85-300.  It is in general higher as the day goes on.   No recent steroids.   Past Medical History:  Diagnosis Date  . Acute bronchitis   . Anemia of other chronic disease   . Anxiety state, unspecified   . Chest pain, unspecified   . Diverticulosis of colon (without mention of hemorrhage)   . Esophageal reflux   . Gallstones   . Generalized osteoarthrosis, unspecified site   . Headache(784.0)   . Lumbago   . Neuropathy in diabetes ([place])    bilat LE's  . Osteoporosis   . Other and unspecified hyperlipidemia   . Other B-complex deficiencies   . Type II or unspecified type diabetes mellitus without mention of complication, not stated as uncontrolled   . Unspecified disorder resulting from impaired renal function   . Unspecified essential hypertension   . Unspecified hypothyroidism     Past Surgical History:  Procedure Laterality Date  . APPENDECTOMY    . THYROIDECTOMY      Social History   Socioeconomic History  . Marital status: Divorced    Spouse name: Not on file  . Number of children: 6  . Years of education: Not on file  . Highest education level: Not on file  Occupational History  . Occupation: retired  Scientific laboratory technician  . Financial resource strain: Somewhat hard  . Food insecurity    Worry: Sometimes true    Inability: Sometimes true   . Transportation needs    Medical: No    Non-medical: No  Tobacco Use  . Smoking status: Former Smoker    Packs/day: 0.50    Years: 6.00    Pack years: 3.00    Types: Cigarettes    Quit date: 02/10/1973    Years since quitting: 45.7  . Smokeless tobacco: Former Systems developer    Types: Snuff    Quit date: 04/07/2010  Substance and Sexual Activity  . Alcohol use: No    Alcohol/week: 0.0 standard drinks  . Drug use: No  . Sexual activity: Never  Lifestyle  . Physical activity    Days per week: 3 days    Minutes per session: 30 min  . Stress: Rather much  Relationships  . Social connections    Talks on phone: More than three times a week    Gets together: More than three times a week    Attends religious service: Not on file    Active member of club or organization: Not on file    Attends meetings of clubs or organizations: Not on file    Relationship status: Divorced  . Intimate partner violence    Fear of current or ex partner: Not on file    Emotionally abused: Not on file    Physically abused: Not on file  Forced sexual activity: Not on file  Other Topics Concern  . Not on file  Social History Narrative   1 child passed away at 67 weeks old---?crib death   Dtr; [name]; Son lives with her [name]; [name]; roger;     Current Outpatient Medications on File Prior to Visit  Medication Sig Dispense Refill  . albuterol (PROVENTIL HFA;VENTOLIN HFA) 108 (90 BASE) MCG/ACT inhaler Inhale 2 puffs into the lungs every 6 (six) hours as needed for wheezing or shortness of breath. 1 Inhaler 6  . amLODipine (NORVASC) 5 MG tablet TAKE 1 TABLET BY MOUTH EVERY DAY (Patient taking differently: Take 5 mg by mouth daily. ) 90 tablet 1  . aspirin 81 MG tablet Take 81 mg by mouth daily.      . busPIRone (BUSPAR) 5 MG tablet Take 1 tablet (5 mg total) by mouth 2 (two) times daily. 60 tablet 5  . Cholecalciferol (VITAMIN D3) 2000 UNITS TABS Take 2,000 Units by mouth 2 (two) times daily.      . cyanocobalamin (,VITAMIN B-12,) 1000 MCG/ML injection INJECT 1ML INTO THE MUSCLE ONCE A MONTH (Patient taking differently: Inject 1,000 mcg into the muscle every 30 (thirty) days. ) 1 mL 11  . fenofibrate 160 MG tablet TAKE 1 TABLET BY MOUTH DAILY (Patient taking differently: Take 160 mg by mouth daily. ) 30 tablet 3  . ferrous sulfate 325 (65 FE) MG EC tablet Take 325 mg by mouth 2 (two) times daily.     Marland Kitchen gabapentin (NEURONTIN) 100 MG capsule TAKE ONE CAPSULE BY MOUTH THREE TIMES DAILY (Patient taking differently: Take 100 mg by mouth 2 (two) times daily. ) 90 capsule 3  . GLOBAL EASE INJECT PEN NEEDLES 32G X 4 MM MISC USE AS DIRECTED WITH THE HUMULIN PEN INSULIN (100 DAY SUPPLY) 100 each 2  . glucose blood (ONETOUCH VERIO) test strip 1 each by Other route 4 (four) times daily. Use one strip to monitor glucose levels qid; DX E11.22 120 each 11  . Insulin NPH, Human,, Isophane, (HUMULIN N KWIKPEN) 100 UNIT/ML Kiwkpen Inject 75 Units into the skin every morning. 10 pen 11  . latanoprost (XALATAN) 0.005 % ophthalmic solution Place 1 drop into both eyes at bedtime.  11  . levothyroxine (SYNTHROID) 137 MCG tablet Take 1 tablet (137 mcg total) by mouth daily before breakfast. 90 tablet 3  . lidocaine (LIDODERM) 5 % APPLY ONE PATCH TO SKIN EVERY DAY. REMOVE AND DISCARD PATCH WITHIN 12 HOURS OR AS DIRECTED BY MD - (Patient taking differently: Place 1 patch onto the skin daily. ) 30 patch 0  . NEEDLE, DISP, 25 G (BD DISP NEEDLES) 25G X 5/8" MISC Use as directed to administer  the b12 injection monthly 50 each 2  . OneTouch Delica Lancets 99991111 MISC 1 each by Does not apply route 4 (four) times daily. Use one lancet to monitor glucose levels qid; DX E11.22 100 each 11  . polyethylene glycol (MIRALAX / GLYCOLAX) packet Take 17 g by mouth daily. (Patient taking differently: Take 17 g by mouth daily as needed for mild constipation. ) 14 each 0  . rosuvastatin (CRESTOR) 20 MG tablet Take 1 tablet (20 mg total) by  mouth daily. Follow-up appt due in Dec must see provider for future refills 30 tablet 3  . saccharomyces boulardii (FLORASTOR) 250 MG capsule Take 1 capsule (250 mg total) by mouth 2 (two) times daily. 60 capsule 0   No current facility-administered medications on file  prior to visit.     Allergies  Allergen Reactions  . Codeine Other (See Comments)    REACTION: change in mental status  . Diphenhydramine Hcl Itching  . Quinine Other (See Comments)    REACTION: abnormal sensations in face  . Sulfonamide Derivatives Rash    Family History  Problem Relation Age of Onset  . Diabetes Mother   . Cancer Father        unsure ? stomach  . Heart attack Brother        x 3  . Cancer Sister        unsure type  . Lung cancer Brother        smoker  . Heart attack Sister     BP (!) 132/42 (BP Location: Left Arm, Patient Position: Sitting, Cuff Size: Normal)   Pulse 100   Ht 5\' 6"  (1.676 m)   Wt 164 lb 3.2 oz (74.5 kg)   SpO2 94%   BMI 26.50 kg/m    Review of Systems She denies hypoglycemia    Objective:   Physical Exam VITAL SIGNS:  See vs page GENERAL: no distress Pulses: dorsalis pedis intact bilat.   MSK: no deformity of the feet CV: no leg edema Skin:  no ulcer on the feet.  normal color and temp on the feet. Neuro: sensation is intact to touch on the feet Ext: both great toenails are ingrown, but no erythema/swelling/drainage.    Lab Results  Component Value Date   HGBA1C 7.6 (A) 11/10/2018   Lab Results  Component Value Date   CREATININE 1.62 (H) 07/09/2018   BUN 34 (H) 07/09/2018   NA 139 07/09/2018   K 4.5 07/09/2018   CL 108 07/09/2018   CO2 22 07/09/2018       Assessment & Plan:  Insulin-requiring type 2 DM, with PN: this is the best control this pt should aim for, given this regimen, which does match insulin to her changing needs throughout the day Renal insuff: she does not need a PM insulin dose. Frail elderly state: in this setting, pt is not a  candidate for aggressive glycemic control.   Patient Instructions  check your blood sugar twice a day.  vary the time of day when you check, between before the 3 meals, and at bedtime.  also check if you have symptoms of your blood sugar being too high or too low.  please keep a record of the readings and bring it to your next appointment here.  You can write it on any piece of paper.  please call us sooner if your blood sugar goes below 70, or if you have a lot of readings over 200.   Please continue the same insulin. On this type of insulin schedule, you should eat meals on a regular schedule.  If a meal is missed or significantly delayed, your blood sugar could go low.   Please come back for a follow-up appointment in 3-4 months.

## 2018-11-11 ENCOUNTER — Emergency Department (HOSPITAL_COMMUNITY): Payer: Medicare Other

## 2018-11-11 ENCOUNTER — Observation Stay (HOSPITAL_COMMUNITY)
Admission: EM | Admit: 2018-11-11 | Discharge: 2018-11-12 | Disposition: A | Discharge status: Home / Self Care | Payer: Medicare Other | Attending: Internal Medicine | Admitting: Internal Medicine

## 2018-11-11 ENCOUNTER — Encounter (HOSPITAL_COMMUNITY): Payer: Self-pay

## 2018-11-11 ENCOUNTER — Other Ambulatory Visit: Payer: Self-pay

## 2018-11-11 DIAGNOSIS — F419 Anxiety disorder, unspecified: Secondary | ICD-10-CM | POA: Insufficient documentation

## 2018-11-11 DIAGNOSIS — Z79899 Other long term (current) drug therapy: Secondary | ICD-10-CM | POA: Insufficient documentation

## 2018-11-11 DIAGNOSIS — E039 Hypothyroidism, unspecified: Secondary | ICD-10-CM | POA: Diagnosis not present

## 2018-11-11 DIAGNOSIS — E119 Type 2 diabetes mellitus without complications: Secondary | ICD-10-CM

## 2018-11-11 DIAGNOSIS — R2681 Unsteadiness on feet: Secondary | ICD-10-CM | POA: Diagnosis not present

## 2018-11-11 DIAGNOSIS — N183 Chronic kidney disease, stage 3 unspecified: Secondary | ICD-10-CM | POA: Insufficient documentation

## 2018-11-11 DIAGNOSIS — N390 Urinary tract infection, site not specified: Secondary | ICD-10-CM | POA: Diagnosis not present

## 2018-11-11 DIAGNOSIS — Z8249 Family history of ischemic heart disease and other diseases of the circulatory system: Secondary | ICD-10-CM | POA: Insufficient documentation

## 2018-11-11 DIAGNOSIS — Z885 Allergy status to narcotic agent status: Secondary | ICD-10-CM | POA: Insufficient documentation

## 2018-11-11 DIAGNOSIS — R296 Repeated falls: Secondary | ICD-10-CM | POA: Insufficient documentation

## 2018-11-11 DIAGNOSIS — E1121 Type 2 diabetes mellitus with diabetic nephropathy: Secondary | ICD-10-CM | POA: Diagnosis not present

## 2018-11-11 DIAGNOSIS — R778 Other specified abnormalities of plasma proteins: Secondary | ICD-10-CM

## 2018-11-11 DIAGNOSIS — Z7982 Long term (current) use of aspirin: Secondary | ICD-10-CM | POA: Insufficient documentation

## 2018-11-11 DIAGNOSIS — N3000 Acute cystitis without hematuria: Secondary | ICD-10-CM

## 2018-11-11 DIAGNOSIS — Z87891 Personal history of nicotine dependence: Secondary | ICD-10-CM | POA: Diagnosis not present

## 2018-11-11 DIAGNOSIS — M1990 Unspecified osteoarthritis, unspecified site: Secondary | ICD-10-CM | POA: Diagnosis not present

## 2018-11-11 DIAGNOSIS — Z20828 Contact with and (suspected) exposure to other viral communicable diseases: Secondary | ICD-10-CM | POA: Diagnosis not present

## 2018-11-11 DIAGNOSIS — Z888 Allergy status to other drugs, medicaments and biological substances status: Secondary | ICD-10-CM | POA: Insufficient documentation

## 2018-11-11 DIAGNOSIS — E782 Mixed hyperlipidemia: Secondary | ICD-10-CM | POA: Diagnosis present

## 2018-11-11 DIAGNOSIS — N189 Chronic kidney disease, unspecified: Secondary | ICD-10-CM

## 2018-11-11 DIAGNOSIS — E1122 Type 2 diabetes mellitus with diabetic chronic kidney disease: Secondary | ICD-10-CM | POA: Insufficient documentation

## 2018-11-11 DIAGNOSIS — E538 Deficiency of other specified B group vitamins: Secondary | ICD-10-CM | POA: Insufficient documentation

## 2018-11-11 DIAGNOSIS — R531 Weakness: Secondary | ICD-10-CM | POA: Diagnosis not present

## 2018-11-11 DIAGNOSIS — E1140 Type 2 diabetes mellitus with diabetic neuropathy, unspecified: Secondary | ICD-10-CM | POA: Diagnosis not present

## 2018-11-11 DIAGNOSIS — I129 Hypertensive chronic kidney disease with stage 1 through stage 4 chronic kidney disease, or unspecified chronic kidney disease: Secondary | ICD-10-CM | POA: Diagnosis not present

## 2018-11-11 DIAGNOSIS — N179 Acute kidney failure, unspecified: Secondary | ICD-10-CM | POA: Diagnosis not present

## 2018-11-11 DIAGNOSIS — G92 Toxic encephalopathy: Secondary | ICD-10-CM | POA: Diagnosis not present

## 2018-11-11 DIAGNOSIS — N1832 Chronic kidney disease, stage 3b: Secondary | ICD-10-CM

## 2018-11-11 DIAGNOSIS — R319 Hematuria, unspecified: Secondary | ICD-10-CM

## 2018-11-11 DIAGNOSIS — Z79890 Hormone replacement therapy (postmenopausal): Secondary | ICD-10-CM | POA: Insufficient documentation

## 2018-11-11 DIAGNOSIS — N184 Chronic kidney disease, stage 4 (severe): Secondary | ICD-10-CM | POA: Diagnosis present

## 2018-11-11 DIAGNOSIS — E785 Hyperlipidemia, unspecified: Secondary | ICD-10-CM | POA: Diagnosis not present

## 2018-11-11 DIAGNOSIS — Z833 Family history of diabetes mellitus: Secondary | ICD-10-CM | POA: Diagnosis not present

## 2018-11-11 DIAGNOSIS — Z882 Allergy status to sulfonamides status: Secondary | ICD-10-CM | POA: Insufficient documentation

## 2018-11-11 DIAGNOSIS — K219 Gastro-esophageal reflux disease without esophagitis: Secondary | ICD-10-CM | POA: Diagnosis not present

## 2018-11-11 DIAGNOSIS — Z794 Long term (current) use of insulin: Secondary | ICD-10-CM | POA: Insufficient documentation

## 2018-11-11 DIAGNOSIS — R5383 Other fatigue: Secondary | ICD-10-CM | POA: Diagnosis not present

## 2018-11-11 DIAGNOSIS — I10 Essential (primary) hypertension: Secondary | ICD-10-CM | POA: Diagnosis not present

## 2018-11-11 DIAGNOSIS — R7989 Other specified abnormal findings of blood chemistry: Secondary | ICD-10-CM | POA: Insufficient documentation

## 2018-11-11 LAB — URINALYSIS, ROUTINE W REFLEX MICROSCOPIC
Bilirubin Urine: NEGATIVE
Glucose, UA: NEGATIVE mg/dL
Ketones, ur: NEGATIVE mg/dL
Nitrite: NEGATIVE
Protein, ur: 100 mg/dL — AB
Specific Gravity, Urine: 1.015 (ref 1.005–1.030)
WBC, UA: >50 WBC/hpf — ABNORMAL HIGH (ref 0–5)
pH: 5 (ref 5.0–8.0)

## 2018-11-11 LAB — BASIC METABOLIC PANEL
Anion gap: 11 (ref 5–15)
BUN: 37 mg/dL — ABNORMAL HIGH (ref 8–23)
CO2: 18 mmol/L — ABNORMAL LOW (ref 22–32)
Calcium: 9.4 mg/dL (ref 8.9–10.3)
Chloride: 104 mmol/L (ref 98–111)
Creatinine, Ser: 2.16 mg/dL — ABNORMAL HIGH (ref 0.44–1.00)
GFR calc Af Amer: 23 mL/min — ABNORMAL LOW (ref >60)
GFR calc non Af Amer: 20 mL/min — ABNORMAL LOW (ref >60)
Glucose, Bld: 240 mg/dL — ABNORMAL HIGH (ref 70–99)
Potassium: 4.4 mmol/L (ref 3.5–5.1)
Sodium: 133 mmol/L — ABNORMAL LOW (ref 135–145)

## 2018-11-11 LAB — CBC
HCT: 36.4 % (ref 36.0–46.0)
Hemoglobin: 11.4 g/dL — ABNORMAL LOW (ref 12.0–15.0)
MCH: 30.9 pg (ref 26.0–34.0)
MCHC: 31.3 g/dL (ref 30.0–36.0)
MCV: 98.6 fL (ref 80.0–100.0)
Platelets: 212 10*3/uL (ref 150–400)
RBC: 3.69 MIL/uL — ABNORMAL LOW (ref 3.87–5.11)
RDW: 12.5 % (ref 11.5–15.5)
WBC: 7.3 10*3/uL (ref 4.0–10.5)
nRBC: 0 % (ref 0.0–0.2)

## 2018-11-11 LAB — TROPONIN I (HIGH SENSITIVITY)
Troponin I (High Sensitivity): 39 ng/L — ABNORMAL HIGH (ref <18)
Troponin I (High Sensitivity): 40 ng/L — ABNORMAL HIGH (ref <18)

## 2018-11-11 LAB — CBG MONITORING, ED: Glucose-Capillary: 215 mg/dL — ABNORMAL HIGH (ref 70–99)

## 2018-11-11 MED ORDER — SODIUM CHLORIDE 0.9 % IV SOLN
INTRAVENOUS | Status: DC
  Administered 2018-11-12: 04:00:00 via INTRAVENOUS

## 2018-11-11 MED ORDER — INSULIN ASPART 100 UNIT/ML ~~LOC~~ SOLN
0.0000 [IU] | Freq: Every day | SUBCUTANEOUS | Status: DC
  Administered 2018-11-12: 3 [IU] via SUBCUTANEOUS

## 2018-11-11 MED ORDER — ACETAMINOPHEN 650 MG RE SUPP: 650.0000 mg | Freq: Four times a day (QID) | RECTAL | Status: DC | PRN

## 2018-11-11 MED ORDER — SODIUM CHLORIDE 0.9 % IV SOLN: 1.0000 g | INTRAVENOUS | Status: DC

## 2018-11-11 MED ORDER — ONDANSETRON HCL 4 MG PO TABS: 4.0000 mg | ORAL_TABLET | Freq: Four times a day (QID) | ORAL | Status: DC | PRN

## 2018-11-11 MED ORDER — INSULIN ASPART 100 UNIT/ML ~~LOC~~ SOLN
0.0000 [IU] | Freq: Three times a day (TID) | SUBCUTANEOUS | Status: DC
  Administered 2018-11-12 (×3): 3 [IU] via SUBCUTANEOUS

## 2018-11-11 MED ORDER — ACETAMINOPHEN 325 MG PO TABS: 650.0000 mg | ORAL_TABLET | Freq: Four times a day (QID) | ORAL | Status: DC | PRN

## 2018-11-11 MED ORDER — ONDANSETRON HCL 4 MG/2ML IJ SOLN: 4.0000 mg | Freq: Four times a day (QID) | INTRAMUSCULAR | Status: DC | PRN

## 2018-11-11 MED ORDER — ENOXAPARIN SODIUM 40 MG/0.4ML ~~LOC~~ SOLN: 40.0000 mg | SUBCUTANEOUS | Status: DC

## 2018-11-11 MED ORDER — SODIUM CHLORIDE 0.9 % IV SOLN
1.0000 g | Freq: Once | INTRAVENOUS | Status: AC
  Administered 2018-11-11: 22:00:00 1 g via INTRAVENOUS
  Filled 2018-11-11: qty 10

## 2018-11-11 NOTE — ED Notes (Signed)
ED TO INPATIENT HANDOFF REPORT  ED Nurse Name and Phone #:   Lenice Pressman  [phone]  S Name/Age/Gender Jocelyn Mendez 85 y.o. female Room/Bed: H015C/H015C  Code Status   Code Status: Partial Code  Home/SNF/Other Home Patient oriented to: self, place, time and situation Is this baseline? Yes   Triage Complete: Triage complete  Chief Complaint Hyperglycemia; Fever; Fatigue  Triage Note Pt arrives POV for eval of weakness and fatigue. DAughter reports that she is normally ambulatory, cooking, independent. States that this AM she was unable to get her off the toilet. Pt is sleeping through triage, responsive, but obviously lethargic. Daughter reports flu shot yesterday     Allergies Allergies  Allergen Reactions  . Codeine Other (See Comments)    REACTION: change in mental status  . Diphenhydramine Hcl Itching  . Quinine Other (See Comments)    REACTION: abnormal sensations in face  . Sulfonamide Derivatives Rash    Level of Care/Admitting Diagnosis ED Disposition    ED Disposition Condition [facility] Area: [place] [[id number]]  Level of Care: Med-Surg [16]  I expect the patient will be discharged within 24 hours: Yes  LOW acuity---Tx typically complete <24 hrs---ACUTE conditions typically can be evaluated <24 hours---LABS likely to return to acceptable levels <24 hours---IS near functional baseline---EXPECTED to return to current living arrangement---NOT newly hypoxic: Meets criteria for 5C-Observation unit  Covid Evaluation: Asymptomatic Screening Protocol (No Symptoms)  Diagnosis: UTI (urinary tract infection) [id number]  Admitting Physician: Elwyn Reach [[id number]]  Attending Physician: Elwyn Reach [[id number]]  PT Class (Do Not Modify): Observation [104]  PT Acc Code (Do Not Modify): Observation [10022]       B Medical/Surgery History Past Medical History:  Diagnosis Date  . Acute bronchitis   . Anemia of other chronic  disease   . Anxiety state, unspecified   . Chest pain, unspecified   . Diverticulosis of colon (without mention of hemorrhage)   . Esophageal reflux   . Gallstones   . Generalized osteoarthrosis, unspecified site   . Headache(784.0)   . Lumbago   . Neuropathy in diabetes ([place])    bilat LE's  . Osteoporosis   . Other and unspecified hyperlipidemia   . Other B-complex deficiencies   . Type II or unspecified type diabetes mellitus without mention of complication, not stated as uncontrolled   . Unspecified disorder resulting from impaired renal function   . Unspecified essential hypertension   . Unspecified hypothyroidism    Past Surgical History:  Procedure Laterality Date  . APPENDECTOMY    . THYROIDECTOMY       A IV Location/Drains/Wounds Patient Lines/Drains/Airways Status   Active Line/Drains/Airways    Name:   Placement date:   Placement time:   Site:   Days:   Peripheral IV 11/11/18 Right;Anterior Forearm   11/11/18    1642    Forearm   less than 1          Intake/Output Last 24 hours No intake or output data in the 24 hours ending 11/11/18 2349  Labs/Imaging Results for orders placed or performed during the hospital encounter of 11/11/18 (from the past 48 hour(s))  Basic metabolic panel     Status: Abnormal   Collection Time: 11/11/18 12:55 PM  Result Value Ref Range   Sodium 133 (L) 135 - 145 mmol/L   Potassium 4.4 3.5 - 5.1 mmol/L   Chloride 104 98 - 111 mmol/L   CO2 18 (  L) 22 - 32 mmol/L   Glucose, Bld 240 (H) 70 - 99 mg/dL   BUN 37 (H) 8 - 23 mg/dL   Creatinine, Ser 2.16 (H) 0.44 - 1.00 mg/dL   Calcium 9.4 8.9 - 10.3 mg/dL   GFR calc non Af Amer 20 (L) >60 mL/min   GFR calc Af Amer 23 (L) >60 mL/min   Anion gap 11 5 - 15    Comment: Performed at [facility] [address], [place], [place] [address]  CBC     Status: Abnormal   Collection Time: 11/11/18 12:55 PM  Result Value Ref Range   WBC 7.3 4.0 - 10.5 K/uL   RBC 3.69 (L) 3.87 - 5.11  MIL/uL   Hemoglobin 11.4 (L) 12.0 - 15.0 g/dL   HCT 36.4 36.0 - 46.0 %   MCV 98.6 80.0 - 100.0 fL   MCH 30.9 26.0 - 34.0 pg   MCHC 31.3 30.0 - 36.0 g/dL   RDW 12.5 11.5 - 15.5 %   Platelets 212 150 - 400 K/uL   nRBC 0.0 0.0 - 0.2 %    Comment: Performed at [facility], [place] [address], [place], [place] [address]  CBG monitoring, ED     Status: Abnormal   Collection Time: 11/11/18  3:30 PM  Result Value Ref Range   Glucose-Capillary 215 (H) 70 - 99 mg/dL  Troponin I (High Sensitivity)     Status: Abnormal   Collection Time: 11/11/18  4:37 PM  Result Value Ref Range   Troponin I (High Sensitivity) 39 (H) <18 ng/L    Comment: (NOTE) Elevated high sensitivity troponin I (hsTnI) values and significant  changes across serial measurements may suggest ACS but many other  chronic and acute conditions are known to elevate hsTnI results.  Refer to the "Links" section for chest pain algorithms and additional  guidance. Performed at [facility], [place] [address] [place], [place] [address]   Troponin I (High Sensitivity)     Status: Abnormal   Collection Time: 11/11/18  7:46 PM  Result Value Ref Range   Troponin I (High Sensitivity) 40 (H) <18 ng/L    Comment: (NOTE) Elevated high sensitivity troponin I (hsTnI) values and significant  changes across serial measurements may suggest ACS but many other  chronic and acute conditions are known to elevate hsTnI results.  Refer to the "Links" section for chest pain algorithms and additional  guidance. Performed at [facility], [place] [address] [place], [place] [address]   Urinalysis, Routine w reflex microscopic     Status: Abnormal   Collection Time: 11/11/18  8:45 PM  Result Value Ref Range   Color, Urine YELLOW YELLOW   APPearance CLOUDY (A) CLEAR   Specific Gravity, Urine 1.015 1.005 - 1.030   pH 5.0 5.0 - 8.0   Glucose, UA NEGATIVE NEGATIVE mg/dL   Hgb urine dipstick SMALL (A) NEGATIVE   Bilirubin Urine NEGATIVE  NEGATIVE   Ketones, ur NEGATIVE NEGATIVE mg/dL   Protein, ur 100 (A) NEGATIVE mg/dL   Nitrite NEGATIVE NEGATIVE   Leukocytes,Ua LARGE (A) NEGATIVE   RBC / HPF 6-10 0 - 5 RBC/hpf   WBC, UA >50 (H) 0 - 5 WBC/hpf   Bacteria, UA RARE (A) NONE SEEN   Squamous Epithelial / LPF 0-5 0 - 5   Mucus PRESENT     Comment: Performed at [facility], [place] [address], [place], [place] [address]   Dg  Chest 2 View  Result Date: 11/11/2018 CLINICAL DATA:  Fatigue weakness EXAM: CHEST - 2 VIEW COMPARISON:  03/05/2017 FINDINGS: The heart size and mediastinal contours are stable. Calcific aortic knob. Both lungs are clear. The visualized skeletal structures are unremarkable. IMPRESSION: No active cardiopulmonary disease. Electronically Signed   By: Davina Poke M.D.   On: 11/11/2018 17:00   Ct Head Wo Contrast  Result Date: 11/11/2018 CLINICAL DATA:  Ct head wo, Pt arrives POV for eval of weakness and fatigue. DAughter reports that she is normally ambulatory, cooking, independent. States that this AM she was unable to get her off the toilet EXAM: CT HEAD WITHOUT CONTRAST TECHNIQUE: Contiguous axial images were obtained from the base of the skull through the vertex without intravenous contrast. COMPARISON:  08/24/2016 FINDINGS: Brain: No evidence of acute infarction, hemorrhage, hydrocephalus, extra-axial collection or mass lesion/mass effect. Mild periventricular white matter hypoattenuation is noted consistent with chronic microvascular ischemic change. Vascular: No hyperdense vessel or unexpected calcification. Skull: Normal. Negative for fracture or focal lesion. Sinuses/Orbits: Globes and orbits are unremarkable. Mild mucosal thickening lines the maxillary sinuses. Other: None. IMPRESSION: 1. No acute intracranial abnormalities. 2. Mild chronic microvascular ischemic change. 3. Mild maxillary sinus mucosal thickening. Electronically Signed   By: Lajean Manes M.D.   On: 11/11/2018 17:44    Pending  Labs Unresulted Labs (From admission, onward)    Start     Ordered   11/18/18 0500  Creatinine, serum  (enoxaparin (LOVENOX)    CrCl >/= 30 ml/min)  Weekly,   R    Comments: while on enoxaparin therapy    11/11/18 2301   11/12/18 0500  CBC  Tomorrow morning,   R     11/11/18 2301   11/12/18 0500  Comprehensive metabolic panel  Tomorrow morning,   R     11/11/18 2301   11/11/18 2302  CBC  (enoxaparin (LOVENOX)    CrCl >/= 30 ml/min)  Once,   STAT    Comments: Baseline for enoxaparin therapy IF NOT ALREADY DRAWN.  Notify MD if PLT < 100 K.    11/11/18 2301   11/11/18 2302  Creatinine, serum  (enoxaparin (LOVENOX)    CrCl >/= 30 ml/min)  Once,   STAT    Comments: Baseline for enoxaparin therapy IF NOT ALREADY DRAWN.    11/11/18 2301   11/11/18 2209  SARS CORONAVIRUS 2 (TAT 6-24 HRS) Nasopharyngeal Nasopharyngeal Swab  (Asymptomatic/Tier 2 Patients Labs)  Once,   STAT    Question Answer Comment  Is this test for diagnosis or screening Screening   Symptomatic for COVID-19 as defined by CDC No   Hospitalized for COVID-19 No   Admitted to ICU for COVID-19 No   Previously tested for COVID-19 No   Resident in a congregate (group) care setting No   Employed in healthcare setting No   Pregnant No      11/11/18 2208   11/11/18 1637  Urine culture  ONCE - STAT,   STAT     11/11/18 1637          Vitals/Pain Today's Vitals   11/11/18 2015 11/11/18 2030 11/11/18 2045 11/11/18 2300  BP: (!) 115/45 (!) 113/46 (!) 121/95 (!) 167/82  Pulse: 62 63    Resp: 17 16 18 20   Temp:      TempSrc:      SpO2: 95% 95%    Weight:      Height:      PainSc:  Isolation Precautions No active isolations  Medications Medications  insulin aspart (novoLOG) injection 0-9 Units (has no administration in time range)  insulin aspart (novoLOG) injection 0-5 Units (has no administration in time range)  enoxaparin (LOVENOX) injection 40 mg (has no administration in time range)  0.9 %  sodium  chloride infusion (has no administration in time range)  acetaminophen (TYLENOL) tablet 650 mg (has no administration in time range)    Or  acetaminophen (TYLENOL) suppository 650 mg (has no administration in time range)  ondansetron (ZOFRAN) tablet 4 mg (has no administration in time range)    Or  ondansetron (ZOFRAN) injection 4 mg (has no administration in time range)  cefTRIAXone (ROCEPHIN) 1 g in sodium chloride 0.9 % 100 mL IVPB (has no administration in time range)  cefTRIAXone (ROCEPHIN) 1 g in sodium chloride 0.9 % 100 mL IVPB (0 g Intravenous Stopped 11/11/18 2348)    Mobility walks Moderate fall risk   Focused Assessments Pulmonary Assessment Handoff:  Lung sounds:   O2 Device: Room Air        R Recommendations: See Admitting Provider Note  Report given to:   Additional Notes:

## 2018-11-11 NOTE — ED Triage Notes (Addendum)
Pt arrives POV for eval of weakness and fatigue. DAughter reports that she is normally ambulatory, cooking, independent. States that this AM she was unable to get her off the toilet. Pt is sleeping through triage, responsive, but obviously lethargic. Daughter reports flu shot yesterday

## 2018-11-11 NOTE — ED Provider Notes (Signed)
  Face-to-face evaluation   History: She presents for evaluation.  Of unresponsiveness mostly on the commode.  She apparently did not fall off the commode.  She was found in that position by family member.  Physical exam: Alert patient, who is alert and responsive.  She denies chest pain, shortness of breath or abdominal pain.  Abdomen is soft and nontender.  Heart, regular rate and rhythm.  Lungs clear anteriorly.  Medical screening examination/treatment/procedure(s) were conducted as a shared visit with non-physician practitioner(s) and myself.  I personally evaluated the patient during the encounter    Daleen Bo, MD 11/11/18 2244

## 2018-11-11 NOTE — ED Provider Notes (Signed)
[place] EMERGENCY DEPARTMENT Provider Note   CSN: [id number] Arrival date & time: 11/11/18  1246     History   Chief Complaint Chief Complaint  Patient presents with  . Weakness    HPI Jocelyn Mendez is a 85 y.o. female.     The history is provided by the patient and medical records. No language interpreter was used.  Weakness Associated symptoms: dysuria   Associated symptoms: no dizziness and no headaches    Jocelyn Mendez is a 85 y.o. female  with a PMH as listed below who presents to the Emergency Department with daughter for episode of weakness that occurred this morning.  Per daughter at bedside, patient lives alone and is very independent.  Ambulates independently, cooks her own meals, etc.  She was over at her daughter's house yesterday eating dinner and seemed in her usual state of health.  This morning, daughter received a phone call from the neighbor that comes to check on her in the morning that she was on the toilet.  Patient states that she went to the restroom, but then could not get up.  She states that she was able to talk throughout this event and was able to move her arms, but could not get up.  Is unclear if this was due to generalized weakness or inability to move the legs.  Eventually, she was able to get off of the toilet and was ambulatory, although seemed unsteady on her feet.  Daughter states that when she got there, she was talking, but saying things that did not make sense.  For example, she kept saying that she needed to go paint, but there was nothing in the house to paint and she does not do this routinely.  Now, she seems better, but still overall more groggy and not as chipper as her usual personality.  Denies any known fevers.  No cough or congestion.  Did have a fall 2 days ago, but denies striking her head.  She states that she just lost her balance and hit her right hip on a coffee table.  She denies any injury from this.  She has  not had any abdominal pain, nausea, vomiting, diarrhea or constipation.  She does report some intermittent burning with urination. No numbness. No headache or acute visual changes. No medications taken prior to arrival for symptoms. Currently, patient states that she feels "fine".   Past Medical History:  Diagnosis Date  . Acute bronchitis   . Anemia of other chronic disease   . Anxiety state, unspecified   . Chest pain, unspecified   . Diverticulosis of colon (without mention of hemorrhage)   . Esophageal reflux   . Gallstones   . Generalized osteoarthrosis, unspecified site   . Headache(784.0)   . Lumbago   . Neuropathy in diabetes ([place])    bilat LE's  . Osteoporosis   . Other and unspecified hyperlipidemia   . Other B-complex deficiencies   . Type II or unspecified type diabetes mellitus without mention of complication, not stated as uncontrolled   . Unspecified disorder resulting from impaired renal function   . Unspecified essential hypertension   . Unspecified hypothyroidism     Patient Active Problem List   Diagnosis Date Noted  . UTI (urinary tract infection) 11/11/2018  . Left breast lump 12/08/2017  . Allergic rhinitis 12/08/2017  . Asthma, chronic, unspecified asthma severity, with acute exacerbation 08/27/2016  . Diabetes ([place]) 03/28/2015  . Gait abnormality 05/13/2010  .  B12 deficiency 11/23/2007  . ANEMIA OF CHRONIC DISEASE 01/21/2007  . CKD (chronic kidney disease) stage 3, GFR 30-59 ml/min 01/21/2007  . Hypothyroidism 11/21/2006  . Hyperlipidemia 11/21/2006  . Essential hypertension 11/21/2006  . GERD 11/21/2006  . DEGENERATIVE JOINT DISEASE, GENERALIZED 11/21/2006  . Chronic bilateral low back pain without sciatica 11/21/2006  . Osteoporosis 11/21/2006    Past Surgical History:  Procedure Laterality Date  . APPENDECTOMY    . THYROIDECTOMY       OB History   No obstetric history on file.      Home Medications    Prior to Admission medications    Medication Sig Start Date End Date Taking? Authorizing Provider  albuterol (PROVENTIL HFA;VENTOLIN HFA) 108 (90 BASE) MCG/ACT inhaler Inhale 2 puffs into the lungs every 6 (six) hours as needed for wheezing or shortness of breath. 04/06/14   Hoyt Koch, MD  amLODipine (NORVASC) 5 MG tablet TAKE 1 TABLET BY MOUTH EVERY DAY 07/19/18   Hoyt Koch, MD  aspirin 81 MG tablet Take 81 mg by mouth daily.      [provider]  busPIRone (BUSPAR) 5 MG tablet Take 1 tablet (5 mg total) by mouth 2 (two) times daily. 07/13/18   Hoyt Koch, MD  Cholecalciferol (VITAMIN D3) 2000 UNITS TABS Take 2,000 Units by mouth 2 (two) times daily.     [provider]  cyanocobalamin (,VITAMIN B-12,) 1000 MCG/ML injection INJECT 1ML INTO THE MUSCLE ONCE A MONTH Patient taking differently: Inject 1,000 mcg into the muscle every 30 (thirty) days.  03/18/18   Hoyt Koch, MD  fenofibrate 160 MG tablet TAKE 1 TABLET BY MOUTH DAILY 10/20/18   Hoyt Koch, MD  ferrous sulfate 325 (65 FE) MG EC tablet Take 325 mg by mouth 2 (two) times daily.     [provider]  furosemide (LASIX) 20 MG tablet TAKE ONE TABLET BY MOUTH EVERY DAY AS NEEDED FOR FLUID OR EDEMA (WEIGHT GAIN OF MORE THAN 3LBS IN ONE DAY OR 5LBS IN TWO DAYS) 11/03/16   Hoyt Koch, MD  gabapentin (NEURONTIN) 100 MG capsule TAKE ONE CAPSULE BY MOUTH THREE TIMES DAILY 10/20/18   Hoyt Koch, MD  GLOBAL EASE INJECT PEN NEEDLES 32G X 4 MM MISC USE AS DIRECTED WITH THE HUMULIN PEN INSULIN (100 DAY SUPPLY) 07/16/18   Renato Shin, MD  glucose blood (ONETOUCH VERIO) test strip 1 each by Other route 4 (four) times daily. Use one strip to monitor glucose levels qid; DX E11.22 06/07/18   Renato Shin, MD  Insulin NPH, Human,, Isophane, (HUMULIN N KWIKPEN) 100 UNIT/ML Kiwkpen Inject 75 Units into the skin every morning. 09/08/18   Renato Shin, MD  latanoprost (XALATAN) 0.005 % ophthalmic solution  Place 1 drop into both eyes at bedtime. 08/12/16   [provider]  levothyroxine (SYNTHROID) 137 MCG tablet Take 1 tablet (137 mcg total) by mouth daily before breakfast. 07/09/18   Renato Shin, MD  lidocaine (LIDODERM) 5 % APPLY ONE PATCH TO SKIN EVERY DAY. REMOVE AND DISCARD PATCH WITHIN 12 HOURS OR AS DIRECTED BY MD - Patient taking differently: Place 1 patch onto the skin daily. REMOVE AND DISCARD PATCH WITHIN 12 HOURS OR AS DIRECTED BY MD 06/17/18   Hoyt Koch, MD  NEEDLE, DISP, 25 G (BD DISP NEEDLES) 25G X 5/8" MISC Use as directed to administer  the b12 injection monthly 10/05/14   Hoyt Koch, MD  OneTouch Delica Lancets [id number] [place]  1 each by Does not apply route 4 (four) times daily. Use one lancet to monitor glucose levels qid; DX E11.22 06/07/18   Renato Shin, MD  polyethylene glycol [facility] / Floria Raveling) packet Take 17 g by mouth daily. 08/30/16   Lavina Hamman, MD  rosuvastatin (CRESTOR) 20 MG tablet Take 1 tablet (20 mg total) by mouth daily. Follow-up appt due in Dec must see provider for future refills 10/20/18   Hoyt Koch, MD  saccharomyces boulardii (FLORASTOR) 250 MG capsule Take 1 capsule (250 mg total) by mouth 2 (two) times daily. 08/26/16   Florencia Reasons, MD    Family History Family History  Problem Relation Age of Onset  . Diabetes Mother   . Cancer Father        unsure ? stomach  . Heart attack Brother        x 3  . Cancer Sister        unsure type  . Lung cancer Brother        smoker  . Heart attack Sister     Social History Social History   Tobacco Use  . Smoking status: Former Smoker    Packs/day: 0.50    Years: 6.00    Pack years: 3.00    Types: Cigarettes    Quit date: 02/10/1973    Years since quitting: 45.7  . Smokeless tobacco: Former Systems developer    Types: Snuff    Quit date: 04/07/2010  Substance Use Topics  . Alcohol use: No    Alcohol/week: 0.0 standard drinks  . Drug use: No     Allergies   Codeine,  Diphenhydramine hcl, Quinine, and Sulfonamide derivatives   Review of Systems Review of Systems  Genitourinary: Positive for dysuria.  Neurological: Positive for weakness. Negative for dizziness, numbness and headaches.  All other systems reviewed and are negative.    Physical Exam Updated Vital Signs BP (!) 158/52   Pulse 65   Temp 98.8 F (37.1 C) (Oral)   Resp 14   Ht 5\' 6"  (1.676 m)   Wt 74.4 kg   SpO2 99%   BMI 26.47 kg/m   Physical Exam Vitals signs and nursing note reviewed.  Constitutional:      General: She is not in acute distress.    Appearance: She is well-developed.  HENT:     Head: Normocephalic and atraumatic.  Neck:     Musculoskeletal: Neck supple.  Cardiovascular:     Rate and Rhythm: Normal rate and regular rhythm.     Heart sounds: Normal heart sounds. No murmur.  Pulmonary:     Effort: Pulmonary effort is normal. No respiratory distress.     Breath sounds: Normal breath sounds.  Abdominal:     General: There is no distension.     Palpations: Abdomen is soft.     Comments: No abdominal, flank or CVA tenderness.  Skin:    General: Skin is warm and dry.  Neurological:     Mental Status: She is alert and oriented to person, place, and time.     Comments: Alert, oriented, thought content appropriate. Able to give a coherent history. Speech is clear and goal oriented, able to follow commands.  Cranial Nerves:  II:  Peripheral visual fields grossly normal, pupils equal, round, reactive to light III, IV, VI: EOM intact bilaterally, ptosis not present V,VII: smile symmetric, eyes kept closed tightly against resistance, facial light touch sensation equal VIII: hearing grossly normal IX, X: symmetric soft palate movement,  uvula elevates symmetrically  XI: bilateral shoulder shrug symmetric and strong XII: midline tongue extension 5/5 muscle strength in upper and lower extremities bilaterally including strong and equal grip strength and  dorsiflexion/plantar flexion Sensory to light touch normal in all four extremities.      ED Treatments / Results  Labs (all labs ordered are listed, but only abnormal results are displayed) Labs Reviewed  BASIC METABOLIC PANEL - Abnormal; Notable for the following components:      Result Value   Sodium 133 (*)    CO2 18 (*)    Glucose, Bld 240 (*)    BUN 37 (*)    Creatinine, Ser 2.16 (*)    GFR calc non Af Amer 20 (*)    GFR calc Af Amer 23 (*)    All other components within normal limits  CBC - Abnormal; Notable for the following components:   RBC 3.69 (*)    Hemoglobin 11.4 (*)    All other components within normal limits  URINALYSIS, ROUTINE W REFLEX MICROSCOPIC - Abnormal; Notable for the following components:   APPearance CLOUDY (*)    Hgb urine dipstick SMALL (*)    Protein, ur 100 (*)    Leukocytes,Ua LARGE (*)    WBC, UA >50 (*)    Bacteria, UA RARE (*)    All other components within normal limits  CBG MONITORING, ED - Abnormal; Notable for the following components:   Glucose-Capillary 215 (*)    All other components within normal limits  TROPONIN I (HIGH SENSITIVITY) - Abnormal; Notable for the following components:   Troponin I (High Sensitivity) 39 (*)    All other components within normal limits  TROPONIN I (HIGH SENSITIVITY) - Abnormal; Notable for the following components:   Troponin I (High Sensitivity) 40 (*)    All other components within normal limits  URINE CULTURE  SARS CORONAVIRUS 2 (TAT 6-24 HRS)    EKG EKG Interpretation  Date/Time:  Thursday November 11 2018 12:53:47 EDT Ventricular Rate:  84 PR Interval:  218 QRS Duration: 70 QT Interval:  386 QTC Calculation: 456 R Axis:   -134 Text Interpretation:  Sinus rhythm with 1st degree A-V block Low voltage QRS Possible Anterolateral infarct , age undetermined Abnormal ECG since last tracing no significant change Confirmed by Daleen Bo [phone]) on 11/11/2018 4:58:01 PM   Radiology Dg  Chest 2 View  Result Date: 11/11/2018 CLINICAL DATA:  Fatigue weakness EXAM: CHEST - 2 VIEW COMPARISON:  03/05/2017 FINDINGS: The heart size and mediastinal contours are stable. Calcific aortic knob. Both lungs are clear. The visualized skeletal structures are unremarkable. IMPRESSION: No active cardiopulmonary disease. Electronically Signed   By: Davina Poke M.D.   On: 11/11/2018 17:00   Ct Head Wo Contrast  Result Date: 11/11/2018 CLINICAL DATA:  Ct head wo, Pt arrives POV for eval of weakness and fatigue. DAughter reports that she is normally ambulatory, cooking, independent. States that this AM she was unable to get her off the toilet EXAM: CT HEAD WITHOUT CONTRAST TECHNIQUE: Contiguous axial images were obtained from the base of the skull through the vertex without intravenous contrast. COMPARISON:  08/24/2016 FINDINGS: Brain: No evidence of acute infarction, hemorrhage, hydrocephalus, extra-axial collection or mass lesion/mass effect. Mild periventricular white matter hypoattenuation is noted consistent with chronic microvascular ischemic change. Vascular: No hyperdense vessel or unexpected calcification. Skull: Normal. Negative for fracture or focal lesion. Sinuses/Orbits: Globes and orbits are unremarkable. Mild mucosal thickening lines the maxillary sinuses. Other: None. IMPRESSION:  1. No acute intracranial abnormalities. 2. Mild chronic microvascular ischemic change. 3. Mild maxillary sinus mucosal thickening. Electronically Signed   By: Lajean Manes M.D.   On: 11/11/2018 17:44    Procedures Procedures (including critical care time)  Medications Ordered in ED Medications  cefTRIAXone (ROCEPHIN) 1 g in sodium chloride 0.9 % 100 mL IVPB (has no administration in time range)     Initial Impression / Assessment and Plan / ED Course  I have reviewed the triage vital signs and the nursing notes.  Pertinent labs & imaging results that were available during my care of the patient were  reviewed by me and considered in my medical decision making (see chart for details).       Jocelyn Mendez is a 85 y.o. female who presents to ED for weakness which began today. Per daughter at bedside, patient was in her usual state of health last night. This morning, she was sitting on the toilet and could not get up. She was not acting herself, seemed lethargic. On my initial examination, patient was afebrile, hemodynamically stable with no focal neurologic deficits and no abdominal tenderness. Lungs CTA. She was a&o x4.  CT head without acute findings.  Chest x-ray clear without evidence of pneumonia.  Normal white count.  Does have a bump in her creatinine.  EKG without acute ischemic changes.  Initial troponin was 39 with repeat of 40.  Urinalysis shows large leukocytes and greater than 50 WBCs.  Suspect this is etiology of her symptoms today.  Urine culture obtained and she was started on Rocephin.  Hospitalist consulted who will admit.  Patient seen by and discussed with Dr. Eulis Foster who agrees with treatment plan.    Final Clinical Impressions(s) / ED Diagnoses   Final diagnoses:  Weakness    ED Discharge Orders    None       Ward, Ozella Almond, PA-C 11/11/18 2216    Daleen Bo, MD 11/11/18 2244

## 2018-11-11 NOTE — ED Notes (Signed)
Patient transported to X-ray 

## 2018-11-11 NOTE — ED Notes (Signed)
Patient transported to CT 

## 2018-11-11 NOTE — H&P (Signed)
History and Physical   Jocelyn Mendez [id number] DOB: Jan 12, 1931 DOA: 11/11/2018  Referring MD/NP/PA: Dr. Eulis Foster  PCP: Hoyt Koch, MD   Outpatient Specialists: Renato Shin, endocrinology  Patient coming from: Home  Chief Complaint: Generalized weakness and mild dysuria  HPI: Jocelyn Mendez is a 85 y.o. female with medical history significant of diabetes, hypertension, osteoarthrosis, diverticular disease, anxiety disorder, hypothyroidism who presented to the ER with weakness for the last 2 days.  Patient lives alone and daughter noted the change in patient's's condition.  Patient apparently was found in the toilet this evening not responding adequately not doing much.  She was weak and sitting on the toilet.  She could not get up.  Daughter got a call from a neighbor and was able to go see her mom.  Patient was confused when she went there and largely not talking.  Brought her to the ER where she was evaluated.  Work-up so far has been negative except for evidence of UTI.  Patient is therefore being admitted with possible UTI induced weakness and altered mental status..  ED Course: Temperature 99.1 blood pressure 150/52, pulse 83 respirate of 18 oxygen sat 96% room air.  White count 7.3 hemoglobin 11.4 and platelets 212.  Sodium 133 potassium 4.4 chloride 104.  CO2 of 18 BUN 37 creatinine 2.16.  Glucose is 240.  Checks x-ray and head CT without contrast were both negative.  Urinalysis showed small ketones large leukocyte.  WBC more than 50 with some bacteria.  Patient will be admitted for UTI and altered mental status  Review of Systems: As per HPI otherwise 10 point review of systems negative.    Past Medical History:  Diagnosis Date  . Acute bronchitis   . Anemia of other chronic disease   . Anxiety state, unspecified   . Chest pain, unspecified   . Diverticulosis of colon (without mention of hemorrhage)   . Esophageal reflux   . Gallstones   . Generalized  osteoarthrosis, unspecified site   . Headache(784.0)   . Lumbago   . Neuropathy in diabetes ([place])    bilat LE's  . Osteoporosis   . Other and unspecified hyperlipidemia   . Other B-complex deficiencies   . Type II or unspecified type diabetes mellitus without mention of complication, not stated as uncontrolled   . Unspecified disorder resulting from impaired renal function   . Unspecified essential hypertension   . Unspecified hypothyroidism     Past Surgical History:  Procedure Laterality Date  . APPENDECTOMY    . THYROIDECTOMY       reports that she quit smoking about 45 years ago. Her smoking use included cigarettes. She has a 3.00 pack-year smoking history. She quit smokeless tobacco use about 8 years ago.  Her smokeless tobacco use included snuff. She reports that she does not drink alcohol or use drugs.  Allergies  Allergen Reactions  . Codeine Other (See Comments)    REACTION: change in mental status  . Diphenhydramine Hcl Itching  . Quinine Other (See Comments)    REACTION: abnormal sensations in face  . Sulfonamide Derivatives Rash    Family History  Problem Relation Age of Onset  . Diabetes Mother   . Cancer Father        unsure ? stomach  . Heart attack Brother        x 3  . Cancer Sister        unsure type  . Lung cancer Brother  smoker  . Heart attack Sister      Prior to Admission medications   Medication Sig Start Date End Date Taking? Authorizing Provider  albuterol (PROVENTIL HFA;VENTOLIN HFA) 108 (90 BASE) MCG/ACT inhaler Inhale 2 puffs into the lungs every 6 (six) hours as needed for wheezing or shortness of breath. 04/06/14   Hoyt Koch, MD  amLODipine (NORVASC) 5 MG tablet TAKE 1 TABLET BY MOUTH EVERY DAY 07/19/18   Hoyt Koch, MD  aspirin 81 MG tablet Take 81 mg by mouth daily.      [provider]  busPIRone (BUSPAR) 5 MG tablet Take 1 tablet (5 mg total) by mouth 2 (two) times daily. 07/13/18   Hoyt Koch, MD  Cholecalciferol (VITAMIN D3) 2000 UNITS TABS Take 2,000 Units by mouth 2 (two) times daily.     [provider]  cyanocobalamin (,VITAMIN B-12,) 1000 MCG/ML injection INJECT 1ML INTO THE MUSCLE ONCE A MONTH 03/18/18   Hoyt Koch, MD  fenofibrate 160 MG tablet TAKE 1 TABLET BY MOUTH DAILY 10/20/18   Hoyt Koch, MD  ferrous sulfate 325 (65 FE) MG EC tablet Take 325 mg by mouth 2 (two) times daily.     [provider]  furosemide (LASIX) 20 MG tablet TAKE ONE TABLET BY MOUTH EVERY DAY AS NEEDED FOR FLUID OR EDEMA (WEIGHT GAIN OF MORE THAN 3LBS IN ONE DAY OR 5LBS IN TWO DAYS) 11/03/16   Hoyt Koch, MD  gabapentin (NEURONTIN) 100 MG capsule TAKE ONE CAPSULE BY MOUTH THREE TIMES DAILY 10/20/18   Hoyt Koch, MD  GLOBAL EASE INJECT PEN NEEDLES 32G X 4 MM MISC USE AS DIRECTED WITH THE HUMULIN PEN INSULIN (100 DAY SUPPLY) 07/16/18   Renato Shin, MD  glucose blood (ONETOUCH VERIO) test strip 1 each by Other route 4 (four) times daily. Use one strip to monitor glucose levels qid; DX E11.22 06/07/18   Renato Shin, MD  Insulin NPH, Human,, Isophane, (HUMULIN N KWIKPEN) 100 UNIT/ML Kiwkpen Inject 75 Units into the skin every morning. 09/08/18   Renato Shin, MD  latanoprost (XALATAN) 0.005 % ophthalmic solution Place 1 drop into both eyes at bedtime. 08/12/16   [provider]  levothyroxine (SYNTHROID) 137 MCG tablet Take 1 tablet (137 mcg total) by mouth daily before breakfast. 07/09/18   Renato Shin, MD  lidocaine (LIDODERM) 5 % APPLY ONE PATCH TO SKIN EVERY DAY. REMOVE AND DISCARD PATCH WITHIN 12 HOURS OR AS DIRECTED BY MD - 06/17/18   Hoyt Koch, MD  NEEDLE, DISP, 25 G (BD DISP NEEDLES) 25G X 5/8" MISC Use as directed to administer  the b12 injection monthly 10/05/14   Hoyt Koch, MD  OneTouch Delica Lancets 99991111 MISC 1 each by Does not apply route 4 (four) times daily. Use one lancet to monitor glucose levels qid;  DX E11.22 06/07/18   Renato Shin, MD  polyethylene glycol [facility] / Floria Raveling) packet Take 17 g by mouth daily. 08/30/16   Lavina Hamman, MD  rosuvastatin (CRESTOR) 20 MG tablet Take 1 tablet (20 mg total) by mouth daily. Follow-up appt due in Dec must see provider for future refills 10/20/18   Hoyt Koch, MD  saccharomyces boulardii (FLORASTOR) 250 MG capsule Take 1 capsule (250 mg total) by mouth 2 (two) times daily. 08/26/16   Florencia Reasons, MD    Physical Exam: Vitals:   11/11/18 1253 11/11/18 1256 11/11/18 1531 11/11/18 1904  BP:  (!) 114/51 112/84 Marland Kitchen)  158/52  Pulse:  83 66 65  Resp:  18 14 14   Temp:  99.1 F (37.3 C) 98.8 F (37.1 C)   TempSrc:   Oral   SpO2:  96% 98% 99%  Weight: 74.4 kg     Height: 5\' 6"  (1.676 m)         Constitutional: Acutely ill looking and mildly confused Vitals:   11/11/18 1253 11/11/18 1256 11/11/18 1531 11/11/18 1904  BP:  (!) 114/51 112/84 (!) 158/52  Pulse:  83 66 65  Resp:  18 14 14   Temp:  99.1 F (37.3 C) 98.8 F (37.1 C)   TempSrc:   Oral   SpO2:  96% 98% 99%  Weight: 74.4 kg     Height: 5\' 6"  (1.676 m)      Eyes: PERRL, lids and conjunctivae normal ENMT: Mucous membranes are moist. Posterior pharynx clear of any exudate or lesions.Normal dentition.  Neck: normal, supple, no masses, no thyromegaly Respiratory: clear to auscultation bilaterally, no wheezing, no crackles. Normal respiratory effort. No accessory muscle use.  Cardiovascular: Tachycardia, no murmurs / rubs / gallops. No extremity edema. 2+ pedal pulses. No carotid bruits.  Abdomen: no tenderness, no masses palpated. No hepatosplenomegaly. Bowel sounds positive.  Musculoskeletal: no clubbing / cyanosis. No joint deformity upper and lower extremities. Good ROM, no contractures. Normal muscle tone.  Skin: no rashes, lesions, ulcers. No induration Neurologic: CN 2-12 grossly intact. Sensation intact, DTR normal. Strength 5/5 in all 4.  Psychiatric: Mildly confused   Normal mood.     Labs on Admission: I have personally reviewed following labs and imaging studies  CBC: Recent Labs  Lab 11/11/18 1255  WBC 7.3  HGB 11.4*  HCT 36.4  MCV 98.6  PLT 99991111   Basic Metabolic Panel: Recent Labs  Lab 11/11/18 1255  NA 133*  K 4.4  CL 104  CO2 18*  GLUCOSE 240*  BUN 37*  CREATININE 2.16*  CALCIUM 9.4   GFR: Estimated Creatinine Clearance: 19.6 mL/min (A) (by C-G formula based on SCr of 2.16 mg/dL (H)). Liver Function Tests: No results for input(s): AST, ALT, ALKPHOS, BILITOT, PROT, ALBUMIN in the last 168 hours. No results for input(s): LIPASE, AMYLASE in the last 168 hours. No results for input(s): AMMONIA in the last 168 hours. Coagulation Profile: No results for input(s): INR, PROTIME in the last 168 hours. Cardiac Enzymes: No results for input(s): CKTOTAL, CKMB, CKMBINDEX, TROPONINI in the last 168 hours. BNP (last 3 results) No results for input(s): PROBNP in the last 8760 hours. HbA1C: Recent Labs    11/10/18 1510  HGBA1C 7.6*   CBG: Recent Labs  Lab 11/11/18 1530  GLUCAP 215*   Lipid Profile: No results for input(s): CHOL, HDL, LDLCALC, TRIG, CHOLHDL, LDLDIRECT in the last 72 hours. Thyroid Function Tests: No results for input(s): TSH, T4TOTAL, FREET4, T3FREE, THYROIDAB in the last 72 hours. Anemia Panel: No results for input(s): VITAMINB12, FOLATE, FERRITIN, TIBC, IRON, RETICCTPCT in the last 72 hours. Urine analysis:    Component Value Date/Time   COLORURINE YELLOW 11/11/2018 2045   APPEARANCEUR CLOUDY (A) 11/11/2018 2045   LABSPEC 1.015 11/11/2018 2045   PHURINE 5.0 11/11/2018 2045   GLUCOSEU NEGATIVE 11/11/2018 2045   GLUCOSEU NEGATIVE 09/03/2016 1227   HGBUR SMALL (A) 11/11/2018 2045   BILIRUBINUR NEGATIVE 11/11/2018 2045   KETONESUR NEGATIVE 11/11/2018 2045   PROTEINUR 100 (A) 11/11/2018 2045   UROBILINOGEN 0.2 09/03/2016 1227   NITRITE NEGATIVE 11/11/2018 2045   LEUKOCYTESUR LARGE (A)  11/11/2018 2045    Sepsis Labs: @LABRCNTIP (procalcitonin:4,lacticidven:4) )No results found for this or any previous visit (from the past 240 hour(s)).   Radiological Exams on Admission: Dg Chest 2 View  Result Date: 11/11/2018 CLINICAL DATA:  Fatigue weakness EXAM: CHEST - 2 VIEW COMPARISON:  03/05/2017 FINDINGS: The heart size and mediastinal contours are stable. Calcific aortic knob. Both lungs are clear. The visualized skeletal structures are unremarkable. IMPRESSION: No active cardiopulmonary disease. Electronically Signed   By: Davina Poke M.D.   On: 11/11/2018 17:00   Ct Head Wo Contrast  Result Date: 11/11/2018 CLINICAL DATA:  Ct head wo, Pt arrives POV for eval of weakness and fatigue. DAughter reports that she is normally ambulatory, cooking, independent. States that this AM she was unable to get her off the toilet EXAM: CT HEAD WITHOUT CONTRAST TECHNIQUE: Contiguous axial images were obtained from the base of the skull through the vertex without intravenous contrast. COMPARISON:  08/24/2016 FINDINGS: Brain: No evidence of acute infarction, hemorrhage, hydrocephalus, extra-axial collection or mass lesion/mass effect. Mild periventricular white matter hypoattenuation is noted consistent with chronic microvascular ischemic change. Vascular: No hyperdense vessel or unexpected calcification. Skull: Normal. Negative for fracture or focal lesion. Sinuses/Orbits: Globes and orbits are unremarkable. Mild mucosal thickening lines the maxillary sinuses. Other: None. IMPRESSION: 1. No acute intracranial abnormalities. 2. Mild chronic microvascular ischemic change. 3. Mild maxillary sinus mucosal thickening. Electronically Signed   By: Lajean Manes M.D.   On: 11/11/2018 17:44      Assessment/Plan Principal Problem:   UTI (urinary tract infection) Active Problems:   Hypothyroidism   Hyperlipidemia   Essential hypertension   GERD   CKD (chronic kidney disease) stage 3, GFR 30-59 ml/min   Renal failure  (ARF), acute on chronic ([place])   Diabetes ([place])     #1 UTI: Urine and blood cultures have been obtained.  Empirically start Rocephin.  Continue treatment.  #2 generalized weakness: Most likely due to UTI.  Patient will benefit from physical therapy prior to discharge.  #3 acute on chronic kidney disease stage III: Baseline creatinine is around 1.6.  Currently is 2.16, most likely prerenal from dehydration.  Aggressively hydrate the patient and monitor  #4 essential hypertension: Resume home regimen and monitor  #5 elevated troponin: Troponin of 39 and 40 so far.  Suspected due to dehydration.  Also acute on chronic kidney injury.  No chest pain and no evidence of coronary artery disease.  #6 diabetes: Hydrate and sliding scale insulin.  Does not appear to be controlled.  #7 GERD: Continue PPIs   DVT prophylaxis: Lovenox Code Status: Partial code Family Communication: Daughter at bedside Disposition Plan: Home Consults called: None Admission status: Observation  Severity of Illness: The appropriate patient status for this patient is OBSERVATION. Observation status is judged to be reasonable and necessary in order to provide the required intensity of service to ensure the patient's safety. The patient's presenting symptoms, physical exam findings, and initial radiographic and laboratory data in the context of their medical condition is felt to place them at decreased risk for further clinical deterioration. Furthermore, it is anticipated that the patient will be medically stable for discharge from the hospital within 2 midnights of admission. The following factors support the patient status of observation.   " The patient's presenting symptoms include weakness and altered mental status. " The physical exam findings include acutely ill looking and confused. " The initial radiographic and laboratory data are worsening renal function with evidence of UTI.  Barbette Merino MD [facility] Pager [phone]  If 7PM-7AM, please contact night-coverage [email or web address] Password [email or web address]  11/11/2018, 10:20 PM

## 2018-11-12 DIAGNOSIS — N179 Acute kidney failure, unspecified: Secondary | ICD-10-CM | POA: Diagnosis not present

## 2018-11-12 DIAGNOSIS — G92 Toxic encephalopathy: Secondary | ICD-10-CM

## 2018-11-12 DIAGNOSIS — E890 Postprocedural hypothyroidism: Secondary | ICD-10-CM

## 2018-11-12 DIAGNOSIS — E785 Hyperlipidemia, unspecified: Secondary | ICD-10-CM | POA: Diagnosis not present

## 2018-11-12 DIAGNOSIS — I10 Essential (primary) hypertension: Secondary | ICD-10-CM | POA: Diagnosis not present

## 2018-11-12 DIAGNOSIS — N390 Urinary tract infection, site not specified: Secondary | ICD-10-CM | POA: Diagnosis not present

## 2018-11-12 DIAGNOSIS — N3000 Acute cystitis without hematuria: Secondary | ICD-10-CM | POA: Diagnosis not present

## 2018-11-12 LAB — CBC
HCT: 34.6 % — ABNORMAL LOW (ref 36.0–46.0)
Hemoglobin: 11.3 g/dL — ABNORMAL LOW (ref 12.0–15.0)
MCH: 31.2 pg (ref 26.0–34.0)
MCHC: 32.7 g/dL (ref 30.0–36.0)
MCV: 95.6 fL (ref 80.0–100.0)
Platelets: 177 10*3/uL (ref 150–400)
RBC: 3.62 MIL/uL — ABNORMAL LOW (ref 3.87–5.11)
RDW: 12.4 % (ref 11.5–15.5)
WBC: 6.2 10*3/uL (ref 4.0–10.5)
nRBC: 0 % (ref 0.0–0.2)

## 2018-11-12 LAB — COMPREHENSIVE METABOLIC PANEL
ALT: 23 U/L (ref 0–44)
AST: 56 U/L — ABNORMAL HIGH (ref 15–41)
Albumin: 3.4 g/dL — ABNORMAL LOW (ref 3.5–5.0)
Alkaline Phosphatase: 33 U/L — ABNORMAL LOW (ref 38–126)
Anion gap: 10 (ref 5–15)
BUN: 36 mg/dL — ABNORMAL HIGH (ref 8–23)
CO2: 20 mmol/L — ABNORMAL LOW (ref 22–32)
Calcium: 9.2 mg/dL (ref 8.9–10.3)
Chloride: 106 mmol/L (ref 98–111)
Creatinine, Ser: 2.17 mg/dL — ABNORMAL HIGH (ref 0.44–1.00)
GFR calc Af Amer: 23 mL/min — ABNORMAL LOW (ref >60)
GFR calc non Af Amer: 20 mL/min — ABNORMAL LOW (ref >60)
Glucose, Bld: 211 mg/dL — ABNORMAL HIGH (ref 70–99)
Potassium: 4.4 mmol/L (ref 3.5–5.1)
Sodium: 136 mmol/L (ref 135–145)
Total Bilirubin: 0.5 mg/dL (ref 0.3–1.2)
Total Protein: 6.5 g/dL (ref 6.5–8.1)

## 2018-11-12 LAB — GLUCOSE, CAPILLARY
Glucose-Capillary: 210 mg/dL — ABNORMAL HIGH (ref 70–99)
Glucose-Capillary: 207 mg/dL — ABNORMAL HIGH (ref 70–99)
Glucose-Capillary: 215 mg/dL — ABNORMAL HIGH (ref 70–99)
Glucose-Capillary: 205 mg/dL — ABNORMAL HIGH (ref 70–99)

## 2018-11-12 LAB — SARS CORONAVIRUS 2 BY RT PCR (HOSPITAL ORDER, PERFORMED IN ~~LOC~~ HOSPITAL LAB): SARS Coronavirus 2: NEGATIVE

## 2018-11-12 LAB — SARS CORONAVIRUS 2 (TAT 6-24 HRS): SARS Coronavirus 2: NEGATIVE

## 2018-11-12 LAB — CBG MONITORING, ED: Glucose-Capillary: 256 mg/dL — ABNORMAL HIGH (ref 70–99)

## 2018-11-12 MED ORDER — ASPIRIN EC 81 MG PO TBEC
81.0000 mg | DELAYED_RELEASE_TABLET | Freq: Every day | ORAL | Status: DC
  Administered 2018-11-12: 81 mg via ORAL
  Filled 2018-11-12: qty 1

## 2018-11-12 MED ORDER — INSULIN NPH (HUMAN) (ISOPHANE) 100 UNIT/ML ~~LOC~~ SUSP
50.0000 [IU] | Freq: Every day | SUBCUTANEOUS | Status: DC
  Administered 2018-11-12: 50 [IU] via SUBCUTANEOUS
  Filled 2018-11-12: qty 10

## 2018-11-12 MED ORDER — SODIUM CHLORIDE 0.9 % IV SOLN
1.0000 g | INTRAVENOUS | Status: DC
  Filled 2018-11-12: qty 10

## 2018-11-12 MED ORDER — CEFUROXIME AXETIL 250 MG PO TABS: 250.0000 mg | ORAL_TABLET | Freq: Two times a day (BID) | ORAL | 0 refills | Status: AC

## 2018-11-12 MED ORDER — FENOFIBRATE 160 MG PO TABS
160.0000 mg | ORAL_TABLET | Freq: Every day | ORAL | Status: DC
  Administered 2018-11-12: 160 mg via ORAL
  Filled 2018-11-12: qty 1

## 2018-11-12 MED ORDER — AMLODIPINE BESYLATE 5 MG PO TABS
5.0000 mg | ORAL_TABLET | Freq: Every day | ORAL | Status: DC
  Administered 2018-11-12: 5 mg via ORAL
  Filled 2018-11-12: qty 1

## 2018-11-12 MED ORDER — ENOXAPARIN SODIUM 30 MG/0.3ML ~~LOC~~ SOLN: 30.0000 mg | SUBCUTANEOUS | Status: DC

## 2018-11-12 MED ORDER — SODIUM CHLORIDE 0.9 % IV SOLN: 1.0000 g | INTRAVENOUS | Status: DC

## 2018-11-12 MED ORDER — FERROUS SULFATE 325 (65 FE) MG PO TABS
325.0000 mg | ORAL_TABLET | Freq: Two times a day (BID) | ORAL | Status: DC
  Administered 2018-11-12: 10:00:00 325 mg via ORAL
  Filled 2018-11-12: qty 1

## 2018-11-12 NOTE — Progress Notes (Signed)
Pt admitted from ED c/o  UTI, fever. Alert and oriented x 4  MAE no acute distress. Oriented to unit call bell within pt reach  To call for assist

## 2018-11-12 NOTE — Progress Notes (Signed)
RN contacted Pt's DTR Jocelyn Mendez and informed Pt has received walker and is ready for D/C. RN discussed Pt's AVS and Rx pickup  w/ DTR via phone. DTR acknowledge understanding. RN informed by DTR that Pt's grandson will pick Pt up in 1hr.

## 2018-11-12 NOTE — TOC Transition Note (Addendum)
Transition of Care [facility]) - CM/SW Discharge Note   Patient Details  Name: Jocelyn Mendez MRN: [id number] Date of Birth: Feb 26, 1930  Transition of Care [facility]) CM/SW Contact:  Pollie Friar, RN Phone Number: 11/12/2018, 1:49 PM   Clinical Narrative:    Patient discharging home with self care. PT recommending [facility] services. CM met with the patient and she is refusing HH. She states she has used HH in the past and doesn't feel she needs them currently. She states her son and daughter in law provide her the needed support and exercise at home.  Rolling walker recommended. Pt states she has this DME at home.  Pt has transportation home when medically ready.  1445: MD spoke to daughter and she doesn't thing pt has a walker. [employer] will deliver the DME to the room.   Final next level of care: Home/Self Care Barriers to Discharge: No Barriers Identified   Patient Goals and CMS Choice   CMS Medicare.gov Compare Post Acute Care list provided to:: Patient Choice offered to / list presented to : Patient  Discharge Placement                       Discharge Plan and Services                                     Social Determinants of Health (SDOH) Interventions     Readmission Risk Interventions No flowsheet data found.

## 2018-11-12 NOTE — Progress Notes (Signed)
Inpatient Diabetes Program Recommendations  AACE/ADA: New Consensus Statement on Inpatient Glycemic Control (2015)  Target Ranges:  Prepandial:   less than 140 mg/dL      Peak postprandial:   less than 180 mg/dL (1-2 hours)      Critically ill patients:  140 - 180 mg/dL   Lab Results  Component Value Date   GLUCAP 215 (H) 11/12/2018   HGBA1C 7.6 (A) 11/10/2018    Review of Glycemic Control Results for Jocelyn Mendez, Jocelyn Mendez (MRN [id number]) as of 11/12/2018 13:30  Ref. Range 11/11/2018 15:30 11/12/2018 00:20 11/12/2018 06:28 11/12/2018 10:17 11/12/2018 11:09  Glucose-Capillary Latest Ref Range: 70 - 99 mg/dL 215 (H) 256 (H) 210 (H) 207 (H) 215 (H)   Diabetes history: DM 2 Outpatient Diabetes medications: Per patient report NPH 70 units daily Current orders for Inpatient glycemic control:  NPH 50 units daily, Novolog sensitive tid with meals and HS Inpatient Diabetes Program Recommendations:    Spoke with patient regarding insulin/ Diabetes.  She reports that she takes NPH once a day.  She checks her blood sugars 4 times a day and states that sometimes they are 106 mg/dL and sometimes 200 mg/dL.  She lives with her son.  I asked if she thought that blood sugar may have been low when she was weak, and she states "I don't think so".  We reviewed signs and symptoms of low blood sugar and the importance of recognizing symptoms and treating in order to prevent passing out, etc.  Patient verbalized understanding.    May consider reducing AM dose of NPH at discharge to ensure that patient is not having low blood sugars.   Thanks  Adah Perl, RN, BC-ADM Inpatient Diabetes Coordinator Pager [phone] (8a-5p)
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
## 2018-11-12 NOTE — Discharge Instructions (Signed)
Please see your PCP in 1 wk for blood work. 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  You were cared for by a hospitalist during your hospital stay. If you have any questions about your discharge medications or the care you received while you were in the hospital after you are discharged, you can call the unit and asked to speak with the hospitalist on call if the hospitalist that took care of you is not available. Once you are discharged, your primary care physician will handle any further medical issues.   Please note that NO REFILLS for any discharge medications will be authorized once you are discharged, as it is imperative that you return to your primary care physician (or establish a relationship with a primary care physician if you do not have one) for your aftercare needs so that they can reassess your need for medications and monitor your lab values.  Please take all your medications with you for your next visit with your Primary MD. Please ask your Primary MD to get all Hospital records sent to his/her office. Please request your Primary MD to go over all hospital test results at the follow up.   If you experience worsening of your admission symptoms, develop shortness of breath, chest pain, suicidal or homicidal thoughts or a life threatening emergency, you must seek medical attention immediately by calling 911 or calling your MD.   Dennis Bast must read the complete instructions/literature along with all the possible adverse reactions/side effects for all the medicines you take including new medications that have been prescribed to you. Take new medicines after you have completely understood and accpet all the possible adverse reactions/side effects.    Do not drive when taking pain medications or sedatives.     Do not take more than prescribed Pain, Sleep and Anxiety Medications   If you have smoked or chewed Tobacco in the last 2 yrs please stop. Stop any regular alcohol  and or recreational  drug use.   Wear Seat belts while driving.

## 2018-11-12 NOTE — Progress Notes (Signed)
Spoke to [facility], case management to inform of DME equipment needs at discharge  CM stated pt refused home PT and has a walker at home  RN verified with pt , pt stated she does have a walker and does not want home PT Informed MD via secure chat  MD verified with pt daughter and stated there is a need for a walker  Called CM again to inform, awaiting delivery of equipment

## 2018-11-12 NOTE — Evaluation (Signed)
Physical Therapy Evaluation Patient Details Name: Jocelyn Mendez MRN: [id number] DOB: 12-19-1930 Today's Date: 11/12/2018   History of Present Illness  Pt is an 85 y/o female admitted secondary to AMS and generalized weakness. Thought to be secondary to UTI. PMH includes CKD, HTN, and DM.   Clinical Impression  Pt admitted secondary to problem above with deficits below. Pt somewhat slow to respond to questions, but answered all questions appropriately. Required min A for mobility tasks with HHA, and was limited secondary to dizziness. Dizziness resolved with seated rest. BP WFL. Anticipate pt will progress well and be able to d/c home with HHPT. Pt reports son available to assist as needed at d/c. Educated about using RW at home. Will continue to follow acutely to maximize functional mobility independence and safety.     Follow Up Recommendations Home health PT;Supervision/Assistance - 24 hour(initial 24/7 assist )    Equipment Recommendations  None recommended by PT    Recommendations for Other Services OT consult     Precautions / Restrictions Precautions Precautions: Fall Restrictions Weight Bearing Restrictions: No      Mobility  Bed Mobility Overal bed mobility: Needs Assistance Bed Mobility: Supine to Sit;Sit to Supine     Supine to sit: Min assist Sit to supine: Supervision   General bed mobility comments: Min A for trunk elevation to come to sitting. Increased time required. Supervision to return to supine.   Transfers Overall transfer level: Needs assistance Equipment used: 1 person hand held assist Transfers: Sit to/from Stand Sit to Stand: Min assist         General transfer comment: Min A for lift assist and steadying assist.   Ambulation/Gait Ambulation/Gait assistance: Min assist Gait Distance (Feet): 40 Feet Assistive device: IV Pole;1 person hand held assist Gait Pattern/deviations: Step-through pattern;Decreased stride length;Trunk flexed Gait  velocity: Decreased    General Gait Details: Slow, unsteady gait. Pt reports some dizziness and required seated rest. Symptoms resolved with seated rest. BP WFL. Educated about using RW at home to increase safety with mobility.   Stairs            Wheelchair Mobility    Modified Rankin (Stroke Patients Only)       Balance Overall balance assessment: Needs assistance Sitting-balance support: No upper extremity supported;Feet supported Sitting balance-Leahy Scale: Fair     Standing balance support: Single extremity supported;Bilateral upper extremity supported;During functional activity Standing balance-Leahy Scale: Poor Standing balance comment: Reliant on UE and external support                              Pertinent Vitals/Pain Pain Assessment: No/denies pain    Home Living Family/patient expects to be discharged to:: Private residence Living Arrangements: Children Available Help at Discharge: Family;Available 24 hours/day Type of Home: House Home Access: Stairs to enter Entrance Stairs-Rails: Psychiatric nurse of Steps: 1 Home Layout: One level Home Equipment: Environmental consultant - 2 wheels      Prior Function Level of Independence: Independent               Hand Dominance        Extremity/Trunk Assessment   Upper Extremity Assessment Upper Extremity Assessment: Defer to OT evaluation    Lower Extremity Assessment Lower Extremity Assessment: Generalized weakness    Cervical / Trunk Assessment Cervical / Trunk Assessment: Kyphotic  Communication   Communication: No difficulties  Cognition Arousal/Alertness: Awake/alert Behavior During Therapy: WFL for tasks  assessed/performed Overall Cognitive Status: Impaired/Different from baseline Area of Impairment: Problem solving                             Problem Solving: Slow processing General Comments: Somewhat slow to respond to questions.       General Comments  General comments (skin integrity, edema, etc.): Pt's son present at beginning of session.     Exercises     Assessment/Plan    PT Assessment Patient needs continued PT services  PT Problem List Decreased strength;Decreased balance;Decreased mobility;Decreased knowledge of use of DME;Decreased activity tolerance       PT Treatment Interventions DME instruction;Gait training;Stair training;Therapeutic exercise;Therapeutic activities;Functional mobility training;Balance training;Patient/family education    PT Goals (Current goals can be found in the Care Plan section)  Acute Rehab PT Goals Patient Stated Goal: to go home PT Goal Formulation: With patient Time For Goal Achievement: 11/26/18 Potential to Achieve Goals: Good    Frequency Min 3X/week   Barriers to discharge        Co-evaluation               AM-PAC PT "6 Clicks" Mobility  Outcome Measure Help needed turning from your back to your side while in a flat bed without using bedrails?: A Little Help needed moving from lying on your back to sitting on the side of a flat bed without using bedrails?: A Little Help needed moving to and from a bed to a chair (including a wheelchair)?: A Little Help needed standing up from a chair using your arms (e.g., wheelchair or bedside chair)?: A Little Help needed to walk in hospital room?: A Little Help needed climbing 3-5 steps with a railing? : A Lot 6 Click Score: 17    End of Session Equipment Utilized During Treatment: Gait belt Activity Tolerance: Patient tolerated treatment well Patient left: in bed;with call bell/phone within reach;with bed alarm set Nurse Communication: Mobility status PT Visit Diagnosis: Unsteadiness on feet (R26.81);Muscle weakness (generalized) (M62.81)    Time: 1050-1110 PT Time Calculation (min) (ACUTE ONLY): 20 min   Charges:   PT Evaluation $PT Eval Low Complexity: Jocelyn Mendez, PT, DPT  Acute Rehabilitation  Services  Pager: [phone] Office: [phone]   Jocelyn Mendez 11/12/2018, 11:23 AM

## 2018-11-14 LAB — URINE CULTURE: Culture: >=100000 — AB

## 2018-11-15 ENCOUNTER — Ambulatory Visit (INDEPENDENT_AMBULATORY_CARE_PROVIDER_SITE_OTHER): Payer: Medicare Other | Admitting: Internal Medicine

## 2018-11-15 ENCOUNTER — Other Ambulatory Visit (INDEPENDENT_AMBULATORY_CARE_PROVIDER_SITE_OTHER): Payer: Medicare Other

## 2018-11-15 ENCOUNTER — Encounter: Payer: Self-pay | Admitting: Internal Medicine

## 2018-11-15 ENCOUNTER — Other Ambulatory Visit: Payer: Self-pay

## 2018-11-15 VITALS — BP 120/62 | HR 84 | Temp 98.6°F | Ht 66.0 in | Wt 162.0 lb

## 2018-11-15 DIAGNOSIS — N179 Acute kidney failure, unspecified: Secondary | ICD-10-CM | POA: Diagnosis not present

## 2018-11-15 DIAGNOSIS — N3000 Acute cystitis without hematuria: Secondary | ICD-10-CM | POA: Diagnosis not present

## 2018-11-15 DIAGNOSIS — N1832 Chronic kidney disease, stage 3b: Secondary | ICD-10-CM | POA: Diagnosis not present

## 2018-11-15 LAB — CBC
HCT: 35.2 % — ABNORMAL LOW (ref 36.0–46.0)
Hemoglobin: 11.7 g/dL — ABNORMAL LOW (ref 12.0–15.0)
MCHC: 33.3 g/dL (ref 30.0–36.0)
MCV: 92.6 fl (ref 78.0–100.0)
Platelets: 226 10*3/uL (ref 150.0–400.0)
RBC: 3.8 Mil/uL — ABNORMAL LOW (ref 3.87–5.11)
RDW: 13.3 % (ref 11.5–15.5)
WBC: 5.8 10*3/uL (ref 4.0–10.5)

## 2018-11-15 LAB — COMPREHENSIVE METABOLIC PANEL
ALT: 15 U/L (ref 0–35)
AST: 24 U/L (ref 0–37)
Albumin: 4.3 g/dL (ref 3.5–5.2)
Alkaline Phosphatase: 50 U/L (ref 39–117)
BUN: 42 mg/dL — ABNORMAL HIGH (ref 6–23)
CO2: 24 mEq/L (ref 19–32)
Calcium: 10.5 mg/dL (ref 8.4–10.5)
Chloride: 106 mEq/L (ref 96–112)
Creatinine, Ser: 1.77 mg/dL — ABNORMAL HIGH (ref 0.40–1.20)
GFR: 27.2 mL/min — ABNORMAL LOW (ref >60.00)
Glucose, Bld: 99 mg/dL (ref 70–99)
Potassium: 4.4 mEq/L (ref 3.5–5.1)
Sodium: 139 mEq/L (ref 135–145)
Total Bilirubin: 0.4 mg/dL (ref 0.2–1.2)
Total Protein: 7.5 g/dL (ref 6.0–8.3)

## 2018-11-15 NOTE — Progress Notes (Signed)
   Subjective:   Patient ID: Jocelyn Mendez, female    DOB: May 10, 1930, 85 y.o.   MRN: [id number]  HPI The patient is an 85 YO female coming in for hospital follow up (in for confusion, weakness, found to have UTI, treated with some recovery, given the weakness she had fallen at home and pain was controlled in hospital). She is starting home health and they have not started yet. She was discharged on Friday. They have just finished the antibiotic for the urinary infection and she is unsure if she is really better. She denies falls since being home. Is using walker and being careful but feels weak and unsteady. Denies fevers or chills. Denies SOB or cough. Denies abdominal pain. Some constipation. Last BM yesterday but small and feels constipated. Denies diarrhea.   PMH, [facility], social history reviewed and updated.   Review of Systems  Constitutional: Positive for activity change and fatigue. Negative for appetite change, fever and unexpected weight change.  HENT: Negative.   Eyes: Negative.   Respiratory: Negative for cough, chest tightness and shortness of breath.   Cardiovascular: Negative for chest pain, palpitations and leg swelling.  Gastrointestinal: Negative for abdominal distention, abdominal pain, constipation, diarrhea, nausea and vomiting.  Musculoskeletal: Negative.   Skin: Negative.   Neurological: Positive for weakness.  Psychiatric/Behavioral: Negative.     Objective:  Physical Exam Constitutional:      Appearance: She is well-developed.     Comments: Appears recently ill  HENT:     Head: Normocephalic and atraumatic.  Neck:     Musculoskeletal: Normal range of motion.  Cardiovascular:     Rate and Rhythm: Normal rate and regular rhythm.  Pulmonary:     Effort: Pulmonary effort is normal. No respiratory distress.     Breath sounds: Normal breath sounds. No wheezing or rales.  Abdominal:     General: Bowel sounds are normal. There is no distension.     Palpations:  Abdomen is soft.     Tenderness: There is no abdominal tenderness. There is no rebound.  Skin:    General: Skin is warm and dry.  Neurological:     Mental Status: She is alert and oriented to person, place, and time.     Coordination: Coordination abnormal.     Comments: Using walker and slow gait with slow to stand     Vitals:   11/15/18 1528  BP: 120/62  Pulse: 84  Temp: 98.6 F (37 C)  TempSrc: Oral  SpO2: 97%  Weight: 162 lb (73.5 kg)  Height: 5\' 6"  (1.676 m)    Assessment & Plan:

## 2018-11-15 NOTE — Patient Instructions (Addendum)
I would take the miralax 2-3 times a day until going to the bathroom.   Then use fiber daily to help stay regular.   We will check the urine test today for infection.

## 2018-11-16 NOTE — Assessment & Plan Note (Signed)
Recheck U/A and urine culture to ensure infection cleared as she is still having some vague symptoms.

## 2018-11-16 NOTE — Assessment & Plan Note (Signed)
With increase in Cr during hospital will recheck CBC and CMP today. BP fine.

## 2018-11-18 LAB — URINE CULTURE
MICRO NUMBER:: 954364
SPECIMEN QUALITY:: ADEQUATE

## 2019-01-12 ENCOUNTER — Other Ambulatory Visit: Payer: Self-pay | Admitting: Internal Medicine

## 2019-02-14 ENCOUNTER — Other Ambulatory Visit: Payer: Self-pay | Admitting: Internal Medicine

## 2019-03-03 ENCOUNTER — Encounter: Payer: Self-pay | Admitting: Internal Medicine

## 2019-03-03 ENCOUNTER — Other Ambulatory Visit: Payer: Self-pay

## 2019-03-03 ENCOUNTER — Ambulatory Visit (INDEPENDENT_AMBULATORY_CARE_PROVIDER_SITE_OTHER): Payer: Medicare Other | Admitting: Internal Medicine

## 2019-03-03 VITALS — BP 150/70 | HR 70 | Temp 98.2°F | Ht 66.0 in | Wt 164.0 lb

## 2019-03-03 DIAGNOSIS — N1832 Chronic kidney disease, stage 3b: Secondary | ICD-10-CM

## 2019-03-03 DIAGNOSIS — N183 Chronic kidney disease, stage 3 unspecified: Secondary | ICD-10-CM

## 2019-03-03 DIAGNOSIS — E890 Postprocedural hypothyroidism: Secondary | ICD-10-CM | POA: Diagnosis not present

## 2019-03-03 DIAGNOSIS — Z0000 Encounter for general adult medical examination without abnormal findings: Secondary | ICD-10-CM

## 2019-03-03 DIAGNOSIS — E1121 Type 2 diabetes mellitus with diabetic nephropathy: Secondary | ICD-10-CM | POA: Diagnosis not present

## 2019-03-03 DIAGNOSIS — I10 Essential (primary) hypertension: Secondary | ICD-10-CM

## 2019-03-03 DIAGNOSIS — E785 Hyperlipidemia, unspecified: Secondary | ICD-10-CM

## 2019-03-03 DIAGNOSIS — J45901 Unspecified asthma with (acute) exacerbation: Secondary | ICD-10-CM

## 2019-03-03 DIAGNOSIS — E1122 Type 2 diabetes mellitus with diabetic chronic kidney disease: Secondary | ICD-10-CM

## 2019-03-03 DIAGNOSIS — E1169 Type 2 diabetes mellitus with other specified complication: Secondary | ICD-10-CM

## 2019-03-03 LAB — COMPREHENSIVE METABOLIC PANEL
ALT: 11 U/L (ref 0–35)
AST: 20 U/L (ref 0–37)
Albumin: 4.2 g/dL (ref 3.5–5.2)
Alkaline Phosphatase: 48 U/L (ref 39–117)
BUN: 36 mg/dL — ABNORMAL HIGH (ref 6–23)
CO2: 26 mEq/L (ref 19–32)
Calcium: 9.7 mg/dL (ref 8.4–10.5)
Chloride: 107 mEq/L (ref 96–112)
Creatinine, Ser: 1.81 mg/dL — ABNORMAL HIGH (ref 0.40–1.20)
GFR: 26.49 mL/min — ABNORMAL LOW (ref >60.00)
Glucose, Bld: 169 mg/dL — ABNORMAL HIGH (ref 70–99)
Potassium: 4.1 mEq/L (ref 3.5–5.1)
Sodium: 138 mEq/L (ref 135–145)
Total Bilirubin: 0.5 mg/dL (ref 0.2–1.2)
Total Protein: 7.2 g/dL (ref 6.0–8.3)

## 2019-03-03 LAB — LIPID PANEL
Cholesterol: 134 mg/dL (ref 0–200)
HDL: 30.4 mg/dL — ABNORMAL LOW (ref >39.00)
NonHDL: 103.76
Total CHOL/HDL Ratio: 4
Triglycerides: 233 mg/dL — ABNORMAL HIGH (ref 0.0–149.0)
VLDL: 46.6 mg/dL — ABNORMAL HIGH (ref 0.0–40.0)

## 2019-03-03 LAB — CBC
HCT: 35.5 % — ABNORMAL LOW (ref 36.0–46.0)
Hemoglobin: 11.7 g/dL — ABNORMAL LOW (ref 12.0–15.0)
MCHC: 33 g/dL (ref 30.0–36.0)
MCV: 94.1 fl (ref 78.0–100.0)
Platelets: 222 10*3/uL (ref 150.0–400.0)
RBC: 3.77 Mil/uL — ABNORMAL LOW (ref 3.87–5.11)
RDW: 13.7 % (ref 11.5–15.5)
WBC: 7.5 10*3/uL (ref 4.0–10.5)

## 2019-03-03 LAB — HEMOGLOBIN A1C: Hgb A1c MFr Bld: 7.7 % — ABNORMAL HIGH (ref 4.6–6.5)

## 2019-03-03 LAB — TSH: TSH: 23.79 u[IU]/mL — ABNORMAL HIGH (ref 0.35–4.50)

## 2019-03-03 LAB — T4, FREE: Free T4: 0.77 ng/dL (ref 0.60–1.60)

## 2019-03-03 MED ORDER — NEOMYCIN-POLYMYXIN-HC 3.5-10000-1 OT SOLN: 3.0000 [drp] | Freq: Three times a day (TID) | OTIC | 0 refills | Status: DC

## 2019-03-03 NOTE — Patient Instructions (Addendum)
We are checking the labs today. I would strongly recommend to get the covid-19 vaccine.   Health Maintenance, Female Adopting a healthy lifestyle and getting preventive care are important in promoting health and wellness. Ask your health care provider about:  The right schedule for you to have regular tests and exams.  Things you can do on your own to prevent diseases and keep yourself healthy. What should I know about diet, weight, and exercise? Eat a healthy diet   Eat a diet that includes plenty of vegetables, fruits, low-fat dairy products, and lean protein.  Do not eat a lot of foods that are high in solid fats, added sugars, or sodium. Maintain a healthy weight Body mass index (BMI) is used to identify weight problems. It estimates body fat based on height and weight. Your health care provider can help determine your BMI and help you achieve or maintain a healthy weight. Get regular exercise Get regular exercise. This is one of the most important things you can do for your health. Most adults should:  Exercise for at least 150 minutes each week. The exercise should increase your heart rate and make you sweat (moderate-intensity exercise).  Do strengthening exercises at least twice a week. This is in addition to the moderate-intensity exercise.  Spend less time sitting. Even light physical activity can be beneficial. Watch cholesterol and blood lipids Have your blood tested for lipids and cholesterol at 84 years of age, then have this test every 5 years. Have your cholesterol levels checked more often if:  Your lipid or cholesterol levels are high.  You are older than 84 years of age.  You are at high risk for heart disease. What should I know about cancer screening? Depending on your health history and family history, you may need to have cancer screening at various ages. This may include screening for:  Breast cancer.  Cervical cancer.  Colorectal cancer.  Skin  cancer.  Lung cancer. What should I know about heart disease, diabetes, and high blood pressure? Blood pressure and heart disease  High blood pressure causes heart disease and increases the risk of stroke. This is more likely to develop in people who have high blood pressure readings, are of African descent, or are overweight.  Have your blood pressure checked: ? Every 3-5 years if you are 72-22 years of age. ? Every year if you are 1 years old or older. Diabetes Have regular diabetes screenings. This checks your fasting blood sugar level. Have the screening done:  Once every three years after age 69 if you are at a normal weight and have a low risk for diabetes.  More often and at a younger age if you are overweight or have a high risk for diabetes. What should I know about preventing infection? Hepatitis B If you have a higher risk for hepatitis B, you should be screened for this virus. Talk with your health care provider to find out if you are at risk for hepatitis B infection. Hepatitis C Testing is recommended for:  Everyone born from [birth year] through 1963.  Anyone with known risk factors for hepatitis C. Sexually transmitted infections (STIs)  Get screened for STIs, including gonorrhea and chlamydia, if: ? You are sexually active and are younger than 84 years of age. ? You are older than 84 years of age and your health care provider tells you that you are at risk for this type of infection. ? Your sexual activity has changed since you were last  screened, and you are at increased risk for chlamydia or gonorrhea. Ask your health care provider if you are at risk.  Ask your health care provider about whether you are at high risk for HIV. Your health care provider may recommend a prescription medicine to help prevent HIV infection. If you choose to take medicine to prevent HIV, you should first get tested for HIV. You should then be tested every 3 months for as long as you are taking  the medicine. Pregnancy  If you are about to stop having your period (premenopausal) and you may become pregnant, seek counseling before you get pregnant.  Take 400 to 800 micrograms (mcg) of folic acid every day if you become pregnant.  Ask for birth control (contraception) if you want to prevent pregnancy. Osteoporosis and menopause Osteoporosis is a disease in which the bones lose minerals and strength with aging. This can result in bone fractures. If you are 82 years old or older, or if you are at risk for osteoporosis and fractures, ask your health care provider if you should:  Be screened for bone loss.  Take a calcium or vitamin D supplement to lower your risk of fractures.  Be given hormone replacement therapy (HRT) to treat symptoms of menopause. Follow these instructions at home: Lifestyle  Do not use any products that contain nicotine or tobacco, such as cigarettes, e-cigarettes, and chewing tobacco. If you need help quitting, ask your health care provider.  Do not use street drugs.  Do not share needles.  Ask your health care provider for help if you need support or information about quitting drugs. Alcohol use  Do not drink alcohol if: ? Your health care provider tells you not to drink. ? You are pregnant, may be pregnant, or are planning to become pregnant.  If you drink alcohol: ? Limit how much you use to 0-1 drink a day. ? Limit intake if you are breastfeeding.  Be aware of how much alcohol is in your drink. In the [place] one drink equals one 12 oz bottle of beer (355 mL), one 5 oz glass of wine (148 mL), or one 1 oz glass of hard liquor (44 mL). General instructions  Schedule regular health, dental, and eye exams.  Stay current with your vaccines.  Tell your health care provider if: ? You often feel depressed. ? You have ever been abused or do not feel safe at home. Summary  Adopting a healthy lifestyle and getting preventive care are important in  promoting health and wellness.  Follow your health care provider's instructions about healthy diet, exercising, and getting tested or screened for diseases.  Follow your health care provider's instructions on monitoring your cholesterol and blood pressure. This information is not intended to replace advice given to you by your health care provider. Make sure you discuss any questions you have with your health care provider. Document Revised: 01/20/2018 Document Reviewed: 01/20/2018 Elsevier Patient Education  2020 [employer].

## 2019-03-03 NOTE — Progress Notes (Signed)
   Subjective:   Patient ID: Jocelyn Mendez, female    DOB: April 07, 1930, 86 y.o.   MRN: [id number]  HPI The patient is an 86 YO female coming in for physical.  PMH, [place], social history reviewed and updated  Review of Systems  Constitutional: Negative.   HENT: Negative.   Eyes: Negative.   Respiratory: Positive for shortness of breath. Negative for cough and chest tightness.        Stable  Cardiovascular: Negative for chest pain, palpitations and leg swelling.  Gastrointestinal: Negative for abdominal distention, abdominal pain, constipation, diarrhea, nausea and vomiting.  Musculoskeletal: Negative.   Skin: Negative.   Neurological: Negative.   Psychiatric/Behavioral: Negative.     Objective:  Physical Exam Constitutional:      Appearance: She is well-developed.  HENT:     Head: Normocephalic and atraumatic.  Cardiovascular:     Rate and Rhythm: Normal rate and regular rhythm.  Pulmonary:     Effort: Pulmonary effort is normal. No respiratory distress.     Breath sounds: No wheezing or rales.     Comments: Stable lung exam Abdominal:     General: Bowel sounds are normal. There is no distension.     Palpations: Abdomen is soft.     Tenderness: There is no abdominal tenderness. There is no rebound.  Musculoskeletal:     Cervical back: Normal range of motion.  Skin:    General: Skin is warm and dry.  Neurological:     Mental Status: She is alert and oriented to person, place, and time.     Coordination: Coordination normal.     Vitals:   03/03/19 1506  BP: (!) 150/70  Pulse: 70  Temp: 98.2 F (36.8 C)  TempSrc: Oral  SpO2: 97%  Weight: 164 lb (74.4 kg)  Height: 5\' 6"  (1.676 m)    This visit occurred during the SARS-CoV-2 public health emergency.  Safety protocols were in place, including screening questions prior to the visit, additional usage of staff PPE, and extensive cleaning of exam room while observing appropriate contact time as indicated for  disinfecting solutions.   Assessment & Plan:

## 2019-03-04 LAB — LDL CHOLESTEROL, DIRECT: Direct LDL: 72 mg/dL

## 2019-03-05 DIAGNOSIS — Z0000 Encounter for general adult medical examination without abnormal findings: Secondary | ICD-10-CM | POA: Insufficient documentation

## 2019-03-05 NOTE — Assessment & Plan Note (Signed)
Checking CMP for stability.  

## 2019-03-05 NOTE — Assessment & Plan Note (Signed)
Foot exam done. Checking HgA1c and reminded about yearly eye exam.

## 2019-03-05 NOTE — Assessment & Plan Note (Signed)
Flu shot up to date. Pneumonia complete. Shingrix counseled. Tetanus declines due to cost. Colonoscopy aged out. Mammogram aged out, pap smear aged out and dexa declines further. Counseled about sun safety and mole surveillance. Counseled about the dangers of distracted driving. Given 10 year screening recommendations.

## 2019-03-05 NOTE — Assessment & Plan Note (Signed)
Taking crestor daily. Checking lipid panel and adjust as needed.

## 2019-03-05 NOTE — Assessment & Plan Note (Signed)
BP at goal today, keep regimen the same.

## 2019-03-05 NOTE — Assessment & Plan Note (Signed)
Stable no flare today.  °

## 2019-03-05 NOTE — Assessment & Plan Note (Signed)
Checking TSH and free T4 and adjust synthroid as needed.

## 2019-03-08 ENCOUNTER — Telehealth: Payer: Self-pay

## 2019-03-08 NOTE — Telephone Encounter (Signed)
New message   Patient daughter calling   1. Test results   2. Different kind of ear drop called in.

## 2019-03-08 NOTE — Telephone Encounter (Signed)
[name], pt's daughter, has been informed and expressed understanding. [name] mentioned that the pt's ear has swollen some since starting the ear drops. She would like to know if something different could be called in? Please advise.

## 2019-03-08 NOTE — Telephone Encounter (Signed)
Would recommend to stop using drops and use saline over the counter in the ears to help up to BID.

## 2019-03-09 NOTE — Telephone Encounter (Signed)
Pt's daughter, [name], has been informed and expressed understanding.

## 2019-03-14 ENCOUNTER — Other Ambulatory Visit: Payer: Self-pay

## 2019-03-15 ENCOUNTER — Other Ambulatory Visit: Payer: Self-pay | Admitting: Internal Medicine

## 2019-03-16 ENCOUNTER — Encounter: Payer: Self-pay | Admitting: Endocrinology

## 2019-03-16 ENCOUNTER — Ambulatory Visit (INDEPENDENT_AMBULATORY_CARE_PROVIDER_SITE_OTHER): Payer: Medicare Other | Admitting: Endocrinology

## 2019-03-16 ENCOUNTER — Other Ambulatory Visit: Payer: Self-pay

## 2019-03-16 VITALS — BP 160/70 | HR 100 | Ht 66.0 in | Wt 166.0 lb

## 2019-03-16 DIAGNOSIS — N1832 Chronic kidney disease, stage 3b: Secondary | ICD-10-CM

## 2019-03-16 DIAGNOSIS — E1121 Type 2 diabetes mellitus with diabetic nephropathy: Secondary | ICD-10-CM | POA: Diagnosis not present

## 2019-03-16 DIAGNOSIS — E1122 Type 2 diabetes mellitus with diabetic chronic kidney disease: Secondary | ICD-10-CM

## 2019-03-16 NOTE — Patient Instructions (Addendum)
Your blood pressure is high today.  Please see your primary care provider soon, to have it rechecked check your blood sugar twice a day.  vary the time of day when you check, between before the 3 meals, and at bedtime.  also check if you have symptoms of your blood sugar being too high or too low.  please keep a record of the readings and bring it to your next appointment here.  You can write it on any piece of paper.  please call us sooner if your blood sugar goes below 70, or if you have a lot of readings over 200.   Please continue the same insulin.  On this type of insulin schedule, you should eat meals on a regular schedule.  If a meal is missed or significantly delayed, your blood sugar could go low.   Please come back for a follow-up appointment in 3-4 months.

## 2019-03-16 NOTE — Progress Notes (Signed)
Subjective:    Patient ID: Jocelyn Mendez, female    DOB: 01/27/1931, 86 y.o.   MRN: [id number]  HPI Pt returns for f/u of diabetes mellitus:  DM type: Insulin-requiring type 2.  Dx'ed: 1988.   Complications: polyneuropathy and renal insuff.   Therapy: insulin since 2012.  GDM: never.  DKA: never Severe hypoglycemia: never.   Pancreatitis: never.   Other: she declines multiple daily injections; she uses pen, due to visual problems; on levemir, she had am hypoglycemia and pm hyperglycemia, so she was changed to QAM NPH.   Interval history:  pt states she feels well in general.  no cbg record, but states it varies from from 80-300.  It is in general higher as the day goes on.   No recent steroids.   Past Medical History:  Diagnosis Date  . Acute bronchitis   . Anemia of other chronic disease   . Anxiety state, unspecified   . Chest pain, unspecified   . Diverticulosis of colon (without mention of hemorrhage)   . Esophageal reflux   . Gallstones   . Generalized osteoarthrosis, unspecified site   . Headache(784.0)   . Lumbago   . Neuropathy in diabetes ([place])    bilat LE's  . Osteoporosis   . Other and unspecified hyperlipidemia   . Other B-complex deficiencies   . Type II or unspecified type diabetes mellitus without mention of complication, not stated as uncontrolled   . Unspecified disorder resulting from impaired renal function   . Unspecified essential hypertension   . Unspecified hypothyroidism     Past Surgical History:  Procedure Laterality Date  . APPENDECTOMY    . THYROIDECTOMY      Social History   Socioeconomic History  . Marital status: Divorced    Spouse name: Not on file  . Number of children: 6  . Years of education: Not on file  . Highest education level: Not on file  Occupational History  . Occupation: retired  Tobacco Use  . Smoking status: Former Smoker    Packs/day: 0.50    Years: 6.00    Pack years: 3.00    Types: Cigarettes    Quit  date: 02/10/1973    Years since quitting: 46.1  . Smokeless tobacco: Former Systems developer    Types: Snuff    Quit date: 04/07/2010  Substance and Sexual Activity  . Alcohol use: No    Alcohol/week: 0.0 standard drinks  . Drug use: No  . Sexual activity: Never  Other Topics Concern  . Not on file  Social History Narrative   1 child passed away at 82 weeks old---?crib death   Dtr; [name]; Son lives with her [name]; [name]; roger;    Social Determinants of Health   Financial Resource Strain:   . Difficulty of Paying Living Expenses: Not on file  Food Insecurity:   . Worried About Charity fundraiser in the Last Year: Not on file  . Ran Out of Food in the Last Year: Not on file  Transportation Needs:   . Lack of Transportation (Medical): Not on file  . Lack of Transportation (Non-Medical): Not on file  Physical Activity:   . Days of Exercise per Week: Not on file  . Minutes of Exercise per Session: Not on file  Stress:   . Feeling of Stress : Not on file  Social Connections:   . Frequency of Communication with Friends and Family: Not on file  . Frequency  of Social Gatherings with Friends and Family: Not on file  . Attends Religious Services: Not on file  . Active Member of Clubs or Organizations: Not on file  . Attends Archivist Meetings: Not on file  . Marital Status: Not on file  Intimate Partner Violence:   . Fear of Current or Ex-Partner: Not on file  . Emotionally Abused: Not on file  . Physically Abused: Not on file  . Sexually Abused: Not on file    Current Outpatient Medications on File Prior to Visit  Medication Sig Dispense Refill  . albuterol (PROVENTIL HFA;VENTOLIN HFA) 108 (90 BASE) MCG/ACT inhaler Inhale 2 puffs into the lungs every 6 (six) hours as needed for wheezing or shortness of breath. 1 Inhaler 6  . amLODipine (NORVASC) 5 MG tablet TAKE 1 TABLET BY MOUTH EVERY DAY 90 tablet 1  . aspirin 81 MG tablet Take 81 mg by mouth daily.      .  busPIRone (BUSPAR) 5 MG tablet TAKE 1 TABLET BY MOUTH TWICE DAILY 60 tablet 5  . Cholecalciferol (VITAMIN D3) 2000 UNITS TABS Take 2,000 Units by mouth 2 (two) times daily.     . cyanocobalamin (,VITAMIN B-12,) 1000 MCG/ML injection INJECT 1ML INTO THE MUSCLE ONCE A MONTH (Patient taking differently: Inject 1,000 mcg into the muscle every 30 (thirty) days. ) 1 mL 11  . fenofibrate 160 MG tablet TAKE 1 TABLET BY MOUTH DAILY 30 tablet 3  . ferrous sulfate 325 (65 FE) MG EC tablet Take 325 mg by mouth 2 (two) times daily.     Marland Kitchen gabapentin (NEURONTIN) 100 MG capsule TAKE ONE CAPSULE BY MOUTH THREE TIMES DAILY 90 capsule 3  . GLOBAL EASE INJECT PEN NEEDLES 32G X 4 MM MISC USE AS DIRECTED WITH THE HUMULIN PEN INSULIN (100 DAY SUPPLY) 100 each 2  . glucose blood (ONETOUCH VERIO) test strip 1 each by Other route 4 (four) times daily. Use one strip to monitor glucose levels qid; DX E11.22 120 each 11  . Insulin NPH, Human,, Isophane, (HUMULIN N KWIKPEN) 100 UNIT/ML Kiwkpen Inject 75 Units into the skin every morning. (Patient taking differently: Inject 70 Units into the skin every morning. ) 10 pen 11  . latanoprost (XALATAN) 0.005 % ophthalmic solution Place 1 drop into both eyes at bedtime.  11  . levothyroxine (SYNTHROID) 137 MCG tablet Take 1 tablet (137 mcg total) by mouth daily before breakfast. 90 tablet 3  . lidocaine (LIDODERM) 5 % APPLY ONE PATCH TO SKIN EVERY DAY. REMOVE AND DISCARD PATCH WITHIN 12 HOURS OR AS DIRECTED BY MD - (Patient taking differently: Place 1 patch onto the skin daily. ) 30 patch 0  . NEEDLE, DISP, 25 G (BD DISP NEEDLES) 25G X 5/8" MISC Use as directed to administer  the b12 injection monthly 50 each 2  . neomycin-polymyxin-hydrocortisone (CORTISPORIN) OTIC solution Place 3 drops into both ears 3 (three) times daily. 10 mL 0  . OneTouch Delica Lancets [id number] MISC 1 each by Does not apply route 4 (four) times daily. Use one lancet to monitor glucose levels qid; DX E11.22 100 each 11    . polyethylene glycol (MIRALAX / GLYCOLAX) packet Take 17 g by mouth daily. (Patient taking differently: Take 17 g by mouth daily as needed for mild constipation. ) 14 each 0  . rosuvastatin (CRESTOR) 20 MG tablet TAKE 1 TABLET BY MOUTH DAILY 30 tablet 3  . saccharomyces boulardii (FLORASTOR) 250 MG capsule Take 1 capsule (250 mg total) by  mouth 2 (two) times daily. 60 capsule 0   No current facility-administered medications on file prior to visit.    Allergies  Allergen Reactions  . Codeine Other (See Comments)    REACTION: change in mental status  . Diphenhydramine Hcl Itching  . Quinine Other (See Comments)    REACTION: abnormal sensations in face  . Sulfonamide Derivatives Rash    Family History  Problem Relation Age of Onset  . Diabetes Mother   . Cancer Father        unsure ? stomach  . Heart attack Brother        x 3  . Cancer Sister        unsure type  . Lung cancer Brother        smoker  . Heart attack Sister     BP (!) 160/70 (BP Location: Right Arm, Patient Position: Sitting, Cuff Size: Large)   Pulse 100   Ht 5\' 6"  (1.676 m)   Wt 166 lb (75.3 kg)   SpO2 94%   BMI 26.79 kg/m    Review of Systems She denies hypoglycemia    Objective:   Physical Exam VITAL SIGNS:  See vs page GENERAL: no distress Pulses: dorsalis pedis intact bilat.   MSK: no deformity of the feet CV: no leg edema.   Skin:  no ulcer on the feet, but the skin is dry.  normal color and temp on the feet. Neuro: sensation is intact to touch on the feet.     Lab Results  Component Value Date   HGBA1C 7.7 (H) 03/03/2019       Assessment & Plan:  HTN: is noted today Insulin-requiring type 2 DM, with PN: this is the best control this pt should aim for, given this regimen, which does match insulin to her changing needs throughout the day Frail elderly state: she is not a candidate for aggressive glycemic control.  Patient Instructions  Your blood pressure is high today.  Please see  your primary care provider soon, to have it rechecked check your blood sugar twice a day.  vary the time of day when you check, between before the 3 meals, and at bedtime.  also check if you have symptoms of your blood sugar being too high or too low.  please keep a record of the readings and bring it to your next appointment here.  You can write it on any piece of paper.  please call us sooner if your blood sugar goes below 70, or if you have a lot of readings over 200.   Please continue the same insulin.  On this type of insulin schedule, you should eat meals on a regular schedule.  If a meal is missed or significantly delayed, your blood sugar could go low.   Please come back for a follow-up appointment in 3-4 months.

## 2019-03-25 ENCOUNTER — Other Ambulatory Visit: Payer: Self-pay | Admitting: Internal Medicine

## 2019-03-29 ENCOUNTER — Other Ambulatory Visit: Payer: Self-pay

## 2019-03-29 MED ORDER — ROSUVASTATIN CALCIUM 20 MG PO TABS: 20.0000 mg | ORAL_TABLET | Freq: Every day | ORAL | 3 refills | Status: DC

## 2019-04-15 ENCOUNTER — Telehealth: Payer: Self-pay | Admitting: Internal Medicine

## 2019-04-15 MED ORDER — CYANOCOBALAMIN 1000 MCG/ML IJ SOLN: INTRAMUSCULAR | 2 refills | Status: DC

## 2019-04-15 NOTE — Telephone Encounter (Signed)
New message:   1.Medication Requested:cyanocobalamin (,VITAMIN B-12,) 1000 MCG/ML injection  2. Pharmacy (Name, [address], [place] [place]  3. On Med List: yes  4. Last Visit with PCP: 03/03/19  5. Next visit date with PCP: 08/31/19   Agent: Please be advised that RX refills may take up to 3 business days. We ask that you follow-up with your pharmacy.

## 2019-05-02 DIAGNOSIS — H401131 Primary open-angle glaucoma, bilateral, mild stage: Secondary | ICD-10-CM | POA: Diagnosis not present

## 2019-06-01 ENCOUNTER — Telehealth: Payer: Self-pay | Admitting: Internal Medicine

## 2019-06-01 DIAGNOSIS — G8929 Other chronic pain: Secondary | ICD-10-CM

## 2019-06-01 DIAGNOSIS — J45901 Unspecified asthma with (acute) exacerbation: Secondary | ICD-10-CM

## 2019-06-01 DIAGNOSIS — M545 Low back pain, unspecified: Secondary | ICD-10-CM

## 2019-06-01 NOTE — Telephone Encounter (Signed)
Please advise 

## 2019-06-01 NOTE — Telephone Encounter (Addendum)
   Daughter calling on behalf of patient to request order for rollator Requesting order be sent to [facility] in [place]  [phone]

## 2019-06-02 NOTE — Telephone Encounter (Signed)
Rx printed and put on your desk, can be mailed to patient or faxed to requested location.

## 2019-06-02 NOTE — Telephone Encounter (Addendum)
Done.   Re faxed on 06-03-2019 to [facility] medical equipment

## 2019-06-14 ENCOUNTER — Other Ambulatory Visit: Payer: Self-pay | Admitting: Internal Medicine

## 2019-06-14 ENCOUNTER — Ambulatory Visit (INDEPENDENT_AMBULATORY_CARE_PROVIDER_SITE_OTHER): Payer: Medicare Other | Admitting: Endocrinology

## 2019-06-14 ENCOUNTER — Other Ambulatory Visit: Payer: Self-pay

## 2019-06-14 ENCOUNTER — Encounter: Payer: Self-pay | Admitting: Endocrinology

## 2019-06-14 ENCOUNTER — Other Ambulatory Visit: Payer: Self-pay | Admitting: Endocrinology

## 2019-06-14 VITALS — BP 154/70 | HR 100 | Ht 66.0 in | Wt 169.0 lb

## 2019-06-14 DIAGNOSIS — N1832 Chronic kidney disease, stage 3b: Secondary | ICD-10-CM | POA: Diagnosis not present

## 2019-06-14 DIAGNOSIS — N183 Chronic kidney disease, stage 3 unspecified: Secondary | ICD-10-CM | POA: Diagnosis not present

## 2019-06-14 DIAGNOSIS — E1121 Type 2 diabetes mellitus with diabetic nephropathy: Secondary | ICD-10-CM

## 2019-06-14 DIAGNOSIS — Z794 Long term (current) use of insulin: Secondary | ICD-10-CM

## 2019-06-14 DIAGNOSIS — E1122 Type 2 diabetes mellitus with diabetic chronic kidney disease: Secondary | ICD-10-CM

## 2019-06-14 LAB — POCT GLYCOSYLATED HEMOGLOBIN (HGB A1C): Hemoglobin A1C: 7.6 % — AB (ref 4.0–5.6)

## 2019-06-14 MED ORDER — HUMULIN N KWIKPEN 100 UNIT/ML ~~LOC~~ SUPN: 68.0000 [IU] | PEN_INJECTOR | SUBCUTANEOUS | 11 refills | Status: DC

## 2019-06-14 NOTE — Telephone Encounter (Signed)
Please advise 

## 2019-06-14 NOTE — Progress Notes (Signed)
Subjective:    Patient ID: Jocelyn Mendez, female    DOB: 02/08/1931, 86 y.o.   MRN: [id number]  HPI Pt returns for f/u of diabetes mellitus:  DM type: Insulin-requiring type 2.  Dx'ed: 1988.   Complications: polyneuropathy and renal insuff.   Therapy: insulin since 2012.  GDM: never.  DKA: never Severe hypoglycemia: never.   Pancreatitis: never.   Other: she declines multiple daily injections; she uses pen, due to visual problems; on levemir, she had am hypoglycemia and pm hyperglycemia, so she was changed to QAM NPH.   Interval history:  pt states she feels well in general.  no cbg record, but states it varies from from 55-304.  It is in general higher as the day goes on.  She says she never misses the insulin.  No recent steroids. She takes her own insulin, and checks her own cbg.   Past Medical History:  Diagnosis Date  . Acute bronchitis   . Anemia of other chronic disease   . Anxiety state, unspecified   . Chest pain, unspecified   . Diverticulosis of colon (without mention of hemorrhage)   . Esophageal reflux   . Gallstones   . Generalized osteoarthrosis, unspecified site   . Headache(784.0)   . Lumbago   . Neuropathy in diabetes ([place])    bilat LE's  . Osteoporosis   . Other and unspecified hyperlipidemia   . Other B-complex deficiencies   . Type II or unspecified type diabetes mellitus without mention of complication, not stated as uncontrolled   . Unspecified disorder resulting from impaired renal function   . Unspecified essential hypertension   . Unspecified hypothyroidism     Past Surgical History:  Procedure Laterality Date  . APPENDECTOMY    . THYROIDECTOMY      Social History   Socioeconomic History  . Marital status: Divorced    Spouse name: Not on file  . Number of children: 6  . Years of education: Not on file  . Highest education level: Not on file  Occupational History  . Occupation: retired  Tobacco Use  . Smoking status: Former Smoker      Packs/day: 0.50    Years: 6.00    Pack years: 3.00    Types: Cigarettes    Quit date: 02/10/1973    Years since quitting: 46.3  . Smokeless tobacco: Former Systems developer    Types: Snuff    Quit date: 04/07/2010  Substance and Sexual Activity  . Alcohol use: No    Alcohol/week: 0.0 standard drinks  . Drug use: No  . Sexual activity: Never  Other Topics Concern  . Not on file  Social History Narrative   1 child passed away at 27 weeks old---?crib death   Dtr; [name]; Son lives with her [name]; [name]; roger;    Social Determinants of Health   Financial Resource Strain:   . Difficulty of Paying Living Expenses:   Food Insecurity:   . Worried About Charity fundraiser in the Last Year:   . Arboriculturist in the Last Year:   Transportation Needs:   . Film/video editor (Medical):   Marland Kitchen Lack of Transportation (Non-Medical):   Physical Activity:   . Days of Exercise per Week:   . Minutes of Exercise per Session:   Stress:   . Feeling of Stress :   Social Connections:   . Frequency of Communication with Friends and Family:   . Frequency  of Social Gatherings with Friends and Family:   . Attends Religious Services:   . Active Member of Clubs or Organizations:   . Attends Archivist Meetings:   Marland Kitchen Marital Status:   Intimate Partner Violence:   . Fear of Current or Ex-Partner:   . Emotionally Abused:   Marland Kitchen Physically Abused:   . Sexually Abused:     Current Outpatient Medications on File Prior to Visit  Medication Sig Dispense Refill  . albuterol (PROVENTIL HFA;VENTOLIN HFA) 108 (90 BASE) MCG/ACT inhaler Inhale 2 puffs into the lungs every 6 (six) hours as needed for wheezing or shortness of breath. 1 Inhaler 6  . amLODipine (NORVASC) 5 MG tablet TAKE 1 TABLET BY MOUTH EVERY DAY 90 tablet 1  . aspirin 81 MG tablet Take 81 mg by mouth daily.      . busPIRone (BUSPAR) 5 MG tablet TAKE 1 TABLET BY MOUTH TWICE DAILY 60 tablet 5  . Cholecalciferol (VITAMIN D3)  2000 UNITS TABS Take 2,000 Units by mouth 2 (two) times daily.     . cyanocobalamin (,VITAMIN B-12,) 1000 MCG/ML injection INJECT 1ML INTO THE MUSCLE ONCE A MONTH 1 mL 2  . ferrous sulfate 325 (65 FE) MG EC tablet Take 325 mg by mouth 2 (two) times daily.     Marland Kitchen latanoprost (XALATAN) 0.005 % ophthalmic solution Place 1 drop into both eyes at bedtime.  11  . lidocaine (LIDODERM) 5 % APPLY ONE PATCH TO SKIN EVERY DAY. REMOVE AND DISCARD PATCH WITHIN 12 HOURS OR AS DIRECTED BY MD - (Patient taking differently: Place 1 patch onto the skin daily. ) 30 patch 0  . NEEDLE, DISP, 25 G (BD DISP NEEDLES) 25G X 5/8" MISC Use as directed to administer  the b12 injection monthly 50 each 2  . neomycin-polymyxin-hydrocortisone (CORTISPORIN) OTIC solution Place 3 drops into both ears 3 (three) times daily. 10 mL 0  . polyethylene glycol (MIRALAX / GLYCOLAX) packet Take 17 g by mouth daily. (Patient taking differently: Take 17 g by mouth daily as needed for mild constipation. ) 14 each 0  . rosuvastatin (CRESTOR) 20 MG tablet Take 1 tablet (20 mg total) by mouth daily. 90 tablet 3  . saccharomyces boulardii (FLORASTOR) 250 MG capsule Take 1 capsule (250 mg total) by mouth 2 (two) times daily. 60 capsule 0   No current facility-administered medications on file prior to visit.    Allergies  Allergen Reactions  . Codeine Other (See Comments)    REACTION: change in mental status  . Diphenhydramine Hcl Itching  . Quinine Other (See Comments)    REACTION: abnormal sensations in face  . Sulfonamide Derivatives Rash    Family History  Problem Relation Age of Onset  . Diabetes Mother   . Cancer Father        unsure ? stomach  . Heart attack Brother        x 3  . Cancer Sister        unsure type  . Lung cancer Brother        smoker  . Heart attack Sister     BP (!) 154/70   Pulse 100   Ht 5\' 6"  (1.676 m)   Wt 169 lb (76.7 kg)   SpO2 98%   BMI 27.28 kg/m     Review of Systems She denies LOC     Objective:   Physical Exam VITAL SIGNS:  See vs page GENERAL: no distress Pulses: dorsalis pedis intact bilat.  MSK: no deformity of the feet CV: trace bilat leg edema, and bilat vv's.   Skin:  no ulcer on the feet.  normal color and temp on the feet. Neuro: sensation is intact to touch on the feet.    Lab Results  Component Value Date   HGBA1C 7.6 (A) 06/14/2019       Assessment & Plan:  HTN: is noted today Insulin-requiring type 2 DM, with CRI. Hypoglycemia, due to insulin: this limits aggressiveness of glycemic control   Patient Instructions  Your blood pressure is high today.  Please see your primary care provider soon, to have it rechecked check your blood sugar twice a day.  vary the time of day when you check, between before the 3 meals, and at bedtime.  also check if you have symptoms of your blood sugar being too high or too low.  please keep a record of the readings and bring it to your next appointment here.  You can write it on any piece of paper.  please call us sooner if your blood sugar goes below 70, or if you have a lot of readings over 200.   Please reduce the insulin to 68 units each morning.   On this type of insulin schedule, you should eat meals on a regular schedule.  If a meal is missed or significantly delayed, your blood sugar could go low.   Please come back for a follow-up appointment in 3-4 months.

## 2019-06-14 NOTE — Patient Instructions (Addendum)
Your blood pressure is high today.  Please see your primary care provider soon, to have it rechecked check your blood sugar twice a day.  vary the time of day when you check, between before the 3 meals, and at bedtime.  also check if you have symptoms of your blood sugar being too high or too low.  please keep a record of the readings and bring it to your next appointment here.  You can write it on any piece of paper.  please call us sooner if your blood sugar goes below 70, or if you have a lot of readings over 200.   Please reduce the insulin to 68 units each morning.   On this type of insulin schedule, you should eat meals on a regular schedule.  If a meal is missed or significantly delayed, your blood sugar could go low.   Please come back for a follow-up appointment in 3-4 months.

## 2019-06-15 ENCOUNTER — Other Ambulatory Visit: Payer: Self-pay | Admitting: Endocrinology

## 2019-06-15 ENCOUNTER — Other Ambulatory Visit: Payer: Self-pay

## 2019-06-15 DIAGNOSIS — E1122 Type 2 diabetes mellitus with diabetic chronic kidney disease: Secondary | ICD-10-CM

## 2019-06-15 MED ORDER — ACCU-CHEK GUIDE VI STRP: 1.0000 | ORAL_STRIP | Freq: Four times a day (QID) | 0 refills | Status: DC

## 2019-06-15 MED ORDER — ACCU-CHEK GUIDE ME W/DEVICE KIT: 1.0000 | PACK | Freq: Four times a day (QID) | 0 refills | Status: DC

## 2019-06-15 MED ORDER — ACCU-CHEK SOFTCLIX LANCETS MISC: 1.0000 | Freq: Four times a day (QID) | 0 refills | Status: DC

## 2019-07-10 ENCOUNTER — Other Ambulatory Visit: Payer: Self-pay | Admitting: Internal Medicine

## 2019-07-12 ENCOUNTER — Other Ambulatory Visit: Payer: Self-pay | Admitting: Internal Medicine

## 2019-08-01 ENCOUNTER — Ambulatory Visit (INDEPENDENT_AMBULATORY_CARE_PROVIDER_SITE_OTHER): Payer: Medicare Other | Admitting: Internal Medicine

## 2019-08-01 ENCOUNTER — Encounter: Payer: Self-pay | Admitting: Internal Medicine

## 2019-08-01 ENCOUNTER — Other Ambulatory Visit: Payer: Self-pay

## 2019-08-01 VITALS — BP 128/76 | HR 91 | Temp 98.6°F | Ht 66.0 in | Wt 165.0 lb

## 2019-08-01 DIAGNOSIS — I10 Essential (primary) hypertension: Secondary | ICD-10-CM

## 2019-08-01 DIAGNOSIS — N1831 Chronic kidney disease, stage 3a: Secondary | ICD-10-CM

## 2019-08-01 DIAGNOSIS — J4520 Mild intermittent asthma, uncomplicated: Secondary | ICD-10-CM | POA: Diagnosis not present

## 2019-08-01 DIAGNOSIS — E890 Postprocedural hypothyroidism: Secondary | ICD-10-CM

## 2019-08-01 LAB — RENAL FUNCTION PANEL
Albumin: 4.3 g/dL (ref 3.5–5.2)
BUN: 30 mg/dL — ABNORMAL HIGH (ref 6–23)
CO2: 26 mEq/L (ref 19–32)
Calcium: 10.1 mg/dL (ref 8.4–10.5)
Chloride: 107 mEq/L (ref 96–112)
Creatinine, Ser: 1.92 mg/dL — ABNORMAL HIGH (ref 0.40–1.20)
GFR: 24.73 mL/min — ABNORMAL LOW (ref >60.00)
Glucose, Bld: 125 mg/dL — ABNORMAL HIGH (ref 70–99)
Phosphorus: 3 mg/dL (ref 2.3–4.6)
Potassium: 4.9 mEq/L (ref 3.5–5.1)
Sodium: 138 mEq/L (ref 135–145)

## 2019-08-01 LAB — TSH: TSH: 15.72 u[IU]/mL — ABNORMAL HIGH (ref 0.35–4.50)

## 2019-08-01 LAB — T4, FREE: Free T4: 0.93 ng/dL (ref 0.60–1.60)

## 2019-08-01 MED ORDER — OXYBUTYNIN CHLORIDE ER 5 MG PO TB24: 5.0000 mg | ORAL_TABLET | Freq: Every day | ORAL | 3 refills | Status: DC

## 2019-08-01 NOTE — Patient Instructions (Addendum)
We have sent in oxybutynin to take 1 pill daily to help with the peeing.   We have given you the prescription for the walker with seat.   We are checking the labs to make sure the kidney function is stable.

## 2019-08-01 NOTE — Progress Notes (Signed)
   Subjective:   Patient ID: Jocelyn Mendez, female    DOB: 10/12/1930, 86 y.o.   MRN: [id number]  HPI The patient is an 86 YO female coming in for follow up of her blood pressure (taking amlodipine and denies side effects, denies chest pains or headaches) and kidney disease (denies change in urination or burning, taking medications as prescribed, sugars are doing well lately) and asthma (taking albuterol as needed, stable overall, still not smoking but exposed to some secondhand smoke, some SOB but not worse).   Review of Systems  Constitutional: Negative.   HENT: Negative.   Eyes: Negative.   Respiratory: Positive for shortness of breath. Negative for cough and chest tightness.   Cardiovascular: Negative for chest pain, palpitations and leg swelling.  Gastrointestinal: Negative for abdominal distention, abdominal pain, constipation, diarrhea, nausea and vomiting.  Musculoskeletal: Positive for arthralgias and myalgias.  Skin: Negative.   Neurological: Negative.   Psychiatric/Behavioral: Negative.     Objective:  Physical Exam Constitutional:      Appearance: She is well-developed.  HENT:     Head: Normocephalic and atraumatic.  Cardiovascular:     Rate and Rhythm: Normal rate and regular rhythm.  Pulmonary:     Effort: Pulmonary effort is normal. No respiratory distress.     Breath sounds: No wheezing or rales.     Comments: Stable lung exam Abdominal:     General: Bowel sounds are normal. There is no distension.     Palpations: Abdomen is soft.     Tenderness: There is no abdominal tenderness. There is no rebound.  Musculoskeletal:     Cervical back: Normal range of motion.  Skin:    General: Skin is warm and dry.  Neurological:     Mental Status: She is alert and oriented to person, place, and time.     Coordination: Coordination normal.     Vitals:   08/01/19 1251  BP: 128/76  Pulse: 91  Temp: 98.6 F (37 C)  TempSrc: Oral  SpO2: 96%  Weight: 165 lb (74.8  kg)  Height: 5\' 6"  (1.676 m)    This visit occurred during the SARS-CoV-2 public health emergency.  Safety protocols were in place, including screening questions prior to the visit, additional usage of staff PPE, and extensive cleaning of exam room while observing appropriate contact time as indicated for disinfecting solutions.   Assessment & Plan:

## 2019-08-03 NOTE — Assessment & Plan Note (Signed)
Checking CMP and adjust as needed. Sugars well controlled and BP at goal.

## 2019-08-03 NOTE — Assessment & Plan Note (Signed)
BP at goal on amlodipine 10 mg daily. Checking CMP and adjust as needed. Complicated by CKD stage 3a.

## 2019-08-03 NOTE — Assessment & Plan Note (Signed)
Stable overall and no flare today. Taking albuterol as needed.

## 2019-08-09 ENCOUNTER — Telehealth: Payer: Self-pay | Admitting: Internal Medicine

## 2019-08-09 NOTE — Telephone Encounter (Signed)
New Message:   Pt's daughter is calling to get lab results from 08/01/19. Please advise.

## 2019-08-09 NOTE — Telephone Encounter (Signed)
Pt's daughter has been informed.  

## 2019-08-10 ENCOUNTER — Other Ambulatory Visit: Payer: Self-pay | Admitting: Internal Medicine

## 2019-08-31 ENCOUNTER — Ambulatory Visit: Payer: Medicare Other | Admitting: Internal Medicine

## 2019-09-07 ENCOUNTER — Other Ambulatory Visit: Payer: Self-pay | Admitting: Endocrinology

## 2019-09-07 DIAGNOSIS — E1122 Type 2 diabetes mellitus with diabetic chronic kidney disease: Secondary | ICD-10-CM

## 2019-09-12 ENCOUNTER — Other Ambulatory Visit: Payer: Self-pay | Admitting: Internal Medicine

## 2019-09-22 ENCOUNTER — Encounter: Payer: Self-pay | Admitting: Internal Medicine

## 2019-09-22 ENCOUNTER — Other Ambulatory Visit: Payer: Self-pay

## 2019-09-22 ENCOUNTER — Ambulatory Visit (INDEPENDENT_AMBULATORY_CARE_PROVIDER_SITE_OTHER): Payer: Medicare Other | Admitting: Internal Medicine

## 2019-09-22 VITALS — BP 116/78 | HR 68 | Temp 98.3°F | Ht 66.0 in

## 2019-09-22 DIAGNOSIS — N184 Chronic kidney disease, stage 4 (severe): Secondary | ICD-10-CM

## 2019-09-22 DIAGNOSIS — R32 Unspecified urinary incontinence: Secondary | ICD-10-CM | POA: Diagnosis not present

## 2019-09-22 MED ORDER — SOLIFENACIN SUCCINATE 10 MG PO TABS
10.0000 mg | ORAL_TABLET | Freq: Every day | ORAL | 1 refills | Status: DC
Start: 2019-09-22 — End: 2020-03-14

## 2019-09-22 NOTE — Progress Notes (Signed)
   Subjective:   Patient ID: Jocelyn Mendez, female    DOB: Sep 06, 1930, 86 y.o.   MRN: [id number]  HPI The patient is an 86 YO female coming in for concerns about urinary frequency and incontinence. She started oxybutynin at night time about 1-2 months ago. Has not noticed a change in urgency or frequency of urination since that time. Would like to try change. Denies other new concerns. Still with back pain chronic and breathing issues chronic which are stable.   Review of Systems  Constitutional: Positive for activity change. Negative for diaphoresis, fatigue and fever.  HENT: Negative.   Eyes: Negative.   Respiratory: Positive for shortness of breath. Negative for cough and chest tightness.   Cardiovascular: Negative for chest pain, palpitations and leg swelling.  Gastrointestinal: Negative for abdominal distention, abdominal pain, constipation, diarrhea, nausea and vomiting.  Genitourinary: Positive for frequency and urgency.  Musculoskeletal: Positive for arthralgias and back pain.  Skin: Negative.   Neurological: Negative.   Psychiatric/Behavioral: Negative.     Objective:  Physical Exam Constitutional:      Appearance: She is well-developed.  HENT:     Head: Normocephalic and atraumatic.  Cardiovascular:     Rate and Rhythm: Normal rate and regular rhythm.  Pulmonary:     Effort: Pulmonary effort is normal. No respiratory distress.     Breath sounds: Normal breath sounds. No wheezing or rales.  Abdominal:     General: Bowel sounds are normal. There is no distension.     Palpations: Abdomen is soft.     Tenderness: There is no abdominal tenderness. There is no rebound.  Musculoskeletal:     Cervical back: Normal range of motion.  Skin:    General: Skin is warm and dry.  Neurological:     Mental Status: She is alert and oriented to person, place, and time.     Coordination: Coordination abnormal.     Comments: Wheelchair for long distances, walker for shorter distances      Vitals:   09/22/19 1055  BP: 116/78  Pulse: 68  Temp: 98.3 F (36.8 C)  TempSrc: Oral  SpO2: 96%  Height: 5\' 6"  (1.676 m)    This visit occurred during the SARS-CoV-2 public health emergency.  Safety protocols were in place, including screening questions prior to the visit, additional usage of staff PPE, and extensive cleaning of exam room while observing appropriate contact time as indicated for disinfecting solutions.   Assessment & Plan:

## 2019-09-22 NOTE — Assessment & Plan Note (Signed)
Rx vesicare to replace oxybutynin as it was not effective. They will let us know how this is working.

## 2019-09-22 NOTE — Assessment & Plan Note (Signed)
Overall with gradual worsening and fairly stable GFR 27 recently.

## 2019-09-22 NOTE — Patient Instructions (Addendum)
We have sent in a different bladder medicine to take called vesicare which is 1 pill daily.   You can stop taking the oxybutynin since it is not helping.

## 2019-09-24 ENCOUNTER — Other Ambulatory Visit: Payer: Self-pay | Admitting: Endocrinology

## 2019-09-24 DIAGNOSIS — E1122 Type 2 diabetes mellitus with diabetic chronic kidney disease: Secondary | ICD-10-CM

## 2019-10-10 ENCOUNTER — Other Ambulatory Visit: Payer: Self-pay | Admitting: Internal Medicine

## 2019-10-19 ENCOUNTER — Encounter: Payer: Self-pay | Admitting: Endocrinology

## 2019-10-19 ENCOUNTER — Ambulatory Visit (INDEPENDENT_AMBULATORY_CARE_PROVIDER_SITE_OTHER): Payer: Medicare Other | Admitting: Endocrinology

## 2019-10-19 ENCOUNTER — Other Ambulatory Visit: Payer: Self-pay

## 2019-10-19 VITALS — BP 130/72 | HR 80 | Ht 66.0 in | Wt 162.6 lb

## 2019-10-19 DIAGNOSIS — Z794 Long term (current) use of insulin: Secondary | ICD-10-CM

## 2019-10-19 DIAGNOSIS — N183 Chronic kidney disease, stage 3 unspecified: Secondary | ICD-10-CM | POA: Diagnosis not present

## 2019-10-19 DIAGNOSIS — E1121 Type 2 diabetes mellitus with diabetic nephropathy: Secondary | ICD-10-CM | POA: Diagnosis not present

## 2019-10-19 DIAGNOSIS — N1832 Chronic kidney disease, stage 3b: Secondary | ICD-10-CM | POA: Diagnosis not present

## 2019-10-19 DIAGNOSIS — E1122 Type 2 diabetes mellitus with diabetic chronic kidney disease: Secondary | ICD-10-CM | POA: Diagnosis not present

## 2019-10-19 LAB — POCT GLYCOSYLATED HEMOGLOBIN (HGB A1C): Hemoglobin A1C: 6.8 % — AB (ref 4.0–5.6)

## 2019-10-19 MED ORDER — HUMULIN N KWIKPEN 100 UNIT/ML ~~LOC~~ SUPN: 64.0000 [IU] | PEN_INJECTOR | SUBCUTANEOUS | 3 refills | Status: DC

## 2019-10-19 NOTE — Progress Notes (Signed)
Subjective:    Patient ID: Jocelyn Mendez, female    DOB: [birth year], 86 y.o.   MRN: [id number]  HPI Pt returns for f/u of diabetes mellitus:  DM type: Insulin-requiring type 2.  Dx'ed: 1988.   Complications: PN and stage 4 CRI Therapy: insulin since 2012.  GDM: never.  DKA: never Severe hypoglycemia: never.   Pancreatitis: never.   SDOH: She takes her own insulin, and checks her own cbg.   Other: she declines multiple daily injections; she uses pen, due to visual problems; on levemir, she had am hypoglycemia and pm hyperglycemia, so she was changed to QAM NPH.   Interval history:  pt states she feels well in general.  no cbg record, but states it varies from from 73-325.  It is in general higher as the day goes on.  She says she never misses the insulin.  No recent steroids. She takes 68 units qam.   Past Medical History:  Diagnosis Date  . Acute bronchitis   . Anemia of other chronic disease   . Anxiety state, unspecified   . Chest pain, unspecified   . Diverticulosis of colon (without mention of hemorrhage)   . Esophageal reflux   . Gallstones   . Generalized osteoarthrosis, unspecified site   . Headache(784.0)   . Lumbago   . Neuropathy in diabetes ([place])    bilat LE's  . Osteoporosis   . Other and unspecified hyperlipidemia   . Other B-complex deficiencies   . Type II or unspecified type diabetes mellitus without mention of complication, not stated as uncontrolled   . Unspecified disorder resulting from impaired renal function   . Unspecified essential hypertension   . Unspecified hypothyroidism     Past Surgical History:  Procedure Laterality Date  . APPENDECTOMY    . THYROIDECTOMY      Social History   Socioeconomic History  . Marital status: Divorced    Spouse name: Not on file  . Number of children: 6  . Years of education: Not on file  . Highest education level: Not on file  Occupational History  . Occupation: retired  Tobacco Use  . Smoking  status: Former Smoker    Packs/day: 0.50    Years: 6.00    Pack years: 3.00    Types: Cigarettes    Quit date: 02/10/1973    Years since quitting: 46.7  . Smokeless tobacco: Former Systems developer    Types: Snuff    Quit date: 04/07/2010  Vaping Use  . Vaping Use: Never used  Substance and Sexual Activity  . Alcohol use: No    Alcohol/week: 0.0 standard drinks  . Drug use: No  . Sexual activity: Never  Other Topics Concern  . Not on file  Social History Narrative   1 child passed away at 50 weeks old---?crib death   Dtr; [name]; Son lives with her [name]; [name]; roger;    Social Determinants of Health   Financial Resource Strain:   . Difficulty of Paying Living Expenses: Not on file  Food Insecurity:   . Worried About Charity fundraiser in the Last Year: Not on file  . Ran Out of Food in the Last Year: Not on file  Transportation Needs:   . Lack of Transportation (Medical): Not on file  . Lack of Transportation (Non-Medical): Not on file  Physical Activity:   . Days of Exercise per Week: Not on file  . Minutes of Exercise per Session: Not  on file  Stress:   . Feeling of Stress : Not on file  Social Connections:   . Frequency of Communication with Friends and Family: Not on file  . Frequency of Social Gatherings with Friends and Family: Not on file  . Attends Religious Services: Not on file  . Active Member of Clubs or Organizations: Not on file  . Attends Archivist Meetings: Not on file  . Marital Status: Not on file  Intimate Partner Violence:   . Fear of Current or Ex-Partner: Not on file  . Emotionally Abused: Not on file  . Physically Abused: Not on file  . Sexually Abused: Not on file    Current Outpatient Medications on File Prior to Visit  Medication Sig Dispense Refill  . Accu-Chek Softclix Lancets lancets 1 each by Other route 4 (four) times daily. E11.9 360 each 0  . albuterol (PROVENTIL HFA;VENTOLIN HFA) 108 (90 BASE) MCG/ACT inhaler  Inhale 2 puffs into the lungs every 6 (six) hours as needed for wheezing or shortness of breath. 1 Inhaler 6  . amLODipine (NORVASC) 5 MG tablet TAKE 1 TABLET BY MOUTH EVERY DAY 90 tablet 1  . aspirin 81 MG tablet Take 81 mg by mouth daily.      . Blood Glucose Monitoring Suppl (ACCU-CHEK GUIDE ME) w/Device KIT 1 each by Does not apply route 4 (four) times daily. E11.9 1 kit 0  . busPIRone (BUSPAR) 5 MG tablet TAKE 1 TABLET BY MOUTH TWICE DAILY 60 tablet 5  . Cholecalciferol (VITAMIN D3) 2000 UNITS TABS Take 2,000 Units by mouth 2 (two) times daily.     . cyanocobalamin (,VITAMIN B-12,) 1000 MCG/ML injection INJECT ONE ML INTRAMUSCULARLY EVERY MONTH 1 mL 2  . fenofibrate 160 MG tablet TAKE 1 TABLET BY MOUTH DAILY 30 tablet 1  . ferrous sulfate 325 (65 FE) MG EC tablet Take 325 mg by mouth 2 (two) times daily.     Marland Kitchen gabapentin (NEURONTIN) 100 MG capsule TAKE ONE CAPSULE BY MOUTH THREE TIMES DAILY 90 capsule 1  . latanoprost (XALATAN) 0.005 % ophthalmic solution Place 1 drop into both eyes at bedtime.  11  . levothyroxine (SYNTHROID) 137 MCG tablet TAKE 1 TABLET BY MOUTH DAILY BEFORE breakfast 90 tablet 3  . lidocaine (LIDODERM) 5 % APPLY ONE PATCH TO SKIN EVERY DAY. REMOVE AND DISCARD PATCH WITHIN 12 HOURS OR AS DIRECTED BY MD - (Patient taking differently: Place 1 patch onto the skin daily. ) 30 patch 0  . NEEDLE, DISP, 25 G (BD DISP NEEDLES) 25G X 5/8" MISC Use as directed to administer  the b12 injection monthly 50 each 2  . neomycin-polymyxin-hydrocortisone (CORTISPORIN) OTIC solution Place 3 drops into both ears 3 (three) times daily. 10 mL 0  . ONETOUCH VERIO test strip USE TO TEST FOUR TIMES DAILY 360 strip 0  . polyethylene glycol (MIRALAX / GLYCOLAX) packet Take 17 g by mouth daily. 14 each 0  . rosuvastatin (CRESTOR) 20 MG tablet Take 1 tablet (20 mg total) by mouth daily. 90 tablet 3  . saccharomyces boulardii (FLORASTOR) 250 MG capsule Take 1 capsule (250 mg total) by mouth 2 (two) times  daily. 60 capsule 0  . solifenacin (VESICARE) 10 MG tablet Take 1 tablet (10 mg total) by mouth daily. 90 tablet 1   No current facility-administered medications on file prior to visit.    Allergies  Allergen Reactions  . Codeine Other (See Comments)    REACTION: change in mental status  .  Diphenhydramine Hcl Itching  . Quinine Other (See Comments)    REACTION: abnormal sensations in face  . Sulfonamide Derivatives Rash    Family History  Problem Relation Age of Onset  . Diabetes Mother   . Cancer Father        unsure ? stomach  . Heart attack Brother        x 3  . Cancer Sister        unsure type  . Lung cancer Brother        smoker  . Heart attack Sister     BP 130/72   Pulse 80   Ht '5\' 6"'  (1.676 m)   Wt 162 lb 9.6 oz (73.8 kg)   SpO2 96%   BMI 26.24 kg/m    Review of Systems     Objective:   Physical Exam VITAL SIGNS:  See vs page GENERAL: no distress Pulses: dorsalis pedis intact bilat.   MSK: no deformity of the feet CV: no leg edema Skin:  no ulcer on the feet.  normal color and temp on the feet. Neuro: sensation is intact to touch on the feet  Lab Results  Component Value Date   HGBA1C 6.8 (A) 10/19/2019   Lab Results  Component Value Date   CREATININE 1.92 (H) 08/01/2019   BUN 30 (H) 08/01/2019   NA 138 08/01/2019   K 4.9 08/01/2019   CL 107 08/01/2019   CO2 26 08/01/2019       Assessment & Plan:  Insulin-requiring type 2 DM, with stage 4 CRI: overcontrolled.    Patient Instructions  check your blood sugar twice a day.  vary the time of day when you check, between before the 3 meals, and at bedtime.  also check if you have symptoms of your blood sugar being too high or too low.  please keep a record of the readings and bring it to your next appointment here.  You can write it on any piece of paper.  please call us sooner if your blood sugar goes below 70, or if you have a lot of readings over 200.   Please reduce the insulin to 64 units  each morning.   On this type of insulin schedule, you should eat meals on a regular schedule.  If a meal is missed or significantly delayed, your blood sugar could go low.   Please come back for a follow-up appointment in January.

## 2019-10-19 NOTE — Patient Instructions (Addendum)
check your blood sugar twice a day.  vary the time of day when you check, between before the 3 meals, and at bedtime.  also check if you have symptoms of your blood sugar being too high or too low.  please keep a record of the readings and bring it to your next appointment here.  You can write it on any piece of paper.  please call us sooner if your blood sugar goes below 70, or if you have a lot of readings over 200.   Please reduce the insulin to 64 units each morning.   On this type of insulin schedule, you should eat meals on a regular schedule.  If a meal is missed or significantly delayed, your blood sugar could go low.   Please come back for a follow-up appointment in January.

## 2019-10-21 ENCOUNTER — Other Ambulatory Visit: Payer: Self-pay | Admitting: Endocrinology

## 2019-10-21 NOTE — Telephone Encounter (Signed)
Please forward refill request to pt's primary care provider.   

## 2019-10-21 NOTE — Telephone Encounter (Signed)
Per Dr. Ellison's request, I am forwarding this refill request. Please review and refill if appropriate.  

## 2019-10-21 NOTE — Telephone Encounter (Signed)
Please review and advise.

## 2019-10-24 ENCOUNTER — Ambulatory Visit (INDEPENDENT_AMBULATORY_CARE_PROVIDER_SITE_OTHER): Payer: Medicare Other

## 2019-10-24 DIAGNOSIS — Z0000 Encounter for general adult medical examination without abnormal findings: Secondary | ICD-10-CM

## 2019-10-24 NOTE — Patient Instructions (Addendum)
Jocelyn Mendez , Thank you for taking time to come for your Medicare Wellness Visit. I appreciate your ongoing commitment to your health goals. Please review the following plan we discussed and let me know if I can assist you in the future.   Screening recommendations/referrals: Colonoscopy: no repeat due to age 86: 07/29/2011 Bone Density: never done Recommended yearly ophthalmology/optometry visit for glaucoma screening and checkup Recommended yearly dental visit for hygiene and checkup  Vaccinations: Influenza vaccine: 11/10/2018; will check with local pharmacy  Pneumococcal vaccine: completed Tdap vaccine: never done Shingles vaccine: never done   Covid-19: completed   Advanced directives: Advance directive discussed with you today. Even though you declined this today please call our office should you change your mind and we can give you the proper paperwork for you to fill out.  Conditions/risks identified: Yes; Reviewed health maintenance screenings with patient today and relevant education, vaccines, and/or referrals were provided. Continue doing brain stimulating activities (puzzles, reading, adult coloring books, staying active) to keep memory sharp. Continue to eat heart healthy diet (full of fruits, vegetables, whole grains, lean protein, water--limit salt, fat, and sugar intake) and increase physical activity as tolerated.  Next appointment: Please schedule your next Medicare Wellness Visit with your Nurse Health Advisor in 1 year.  Preventive Care 37 Years and Older, Female Preventive care refers to lifestyle choices and visits with your health care provider that can promote health and wellness. What does preventive care include?  A yearly physical exam. This is also called an annual well check.  Dental exams once or twice a year.  Routine eye exams. Ask your health care provider how often you should have your eyes checked.  Personal lifestyle choices,  including:  Daily care of your teeth and gums.  Regular physical activity.  Eating a healthy diet.  Avoiding tobacco and drug use.  Limiting alcohol use.  Practicing safe sex.  Taking low-dose aspirin every day.  Taking vitamin and mineral supplements as recommended by your health care provider. What happens during an annual well check? The services and screenings done by your health care provider during your annual well check will depend on your age, overall health, lifestyle risk factors, and family history of disease. Counseling  Your health care provider may ask you questions about your:  Alcohol use.  Tobacco use.  Drug use.  Emotional well-being.  Home and relationship well-being.  Sexual activity.  Eating habits.  History of falls.  Memory and ability to understand (cognition).  Work and work Statistician.  Reproductive health. Screening  You may have the following tests or measurements:  Height, weight, and BMI.  Blood pressure.  Lipid and cholesterol levels. These may be checked every 5 years, or more frequently if you are over 71 years old.  Skin check.  Lung cancer screening. You may have this screening every year starting at age 63 if you have a 30-pack-year history of smoking and currently smoke or have quit within the past 15 years.  Fecal occult blood test (FOBT) of the stool. You may have this test every year starting at age 26.  Flexible sigmoidoscopy or colonoscopy. You may have a sigmoidoscopy every 5 years or a colonoscopy every 10 years starting at age 49.  Hepatitis C blood test.  Hepatitis B blood test.  Sexually transmitted disease (STD) testing.  Diabetes screening. This is done by checking your blood sugar (glucose) after you have not eaten for a while (fasting). You may have this done every 1-3  years.  Bone density scan. This is done to screen for osteoporosis. You may have this done starting at age 18.  Mammogram. This  may be done every 1-2 years. Talk to your health care provider about how often you should have regular mammograms. Talk with your health care provider about your test results, treatment options, and if necessary, the need for more tests. Vaccines  Your health care provider may recommend certain vaccines, such as:  Influenza vaccine. This is recommended every year.  Tetanus, diphtheria, and acellular pertussis (Tdap, Td) vaccine. You may need a Td booster every 10 years.  Zoster vaccine. You may need this after age 60.  Pneumococcal 13-valent conjugate (PCV13) vaccine. One dose is recommended after age 67.  Pneumococcal polysaccharide (PPSV23) vaccine. One dose is recommended after age 75. Talk to your health care provider about which screenings and vaccines you need and how often you need them. This information is not intended to replace advice given to you by your health care provider. Make sure you discuss any questions you have with your health care provider. Document Released: 02/23/2015 Document Revised: 10/17/2015 Document Reviewed: 11/28/2014 Elsevier Interactive Patient Education  2017 [place] Prevention in the Home Falls can cause injuries. They can happen to people of all ages. There are many things you can do to make your home safe and to help prevent falls. What can I do on the outside of my home?  Regularly fix the edges of walkways and driveways and fix any cracks.  Remove anything that might make you trip as you walk through a door, such as a raised step or threshold.  Trim any bushes or trees on the path to your home.  Use bright outdoor lighting.  Clear any walking paths of anything that might make someone trip, such as rocks or tools.  Regularly check to see if handrails are loose or broken. Make sure that both sides of any steps have handrails.  Any raised decks and porches should have guardrails on the edges.  Have any leaves, snow, or ice cleared  regularly.  Use sand or salt on walking paths during winter.  Clean up any spills in your garage right away. This includes oil or grease spills. What can I do in the bathroom?  Use night lights.  Install grab bars by the toilet and in the tub and shower. Do not use towel bars as grab bars.  Use non-skid mats or decals in the tub or shower.  If you need to sit down in the shower, use a plastic, non-slip stool.  Keep the floor dry. Clean up any water that spills on the floor as soon as it happens.  Remove soap buildup in the tub or shower regularly.  Attach bath mats securely with double-sided non-slip rug tape.  Do not have throw rugs and other things on the floor that can make you trip. What can I do in the bedroom?  Use night lights.  Make sure that you have a light by your bed that is easy to reach.  Do not use any sheets or blankets that are too big for your bed. They should not hang down onto the floor.  Have a firm chair that has side arms. You can use this for support while you get dressed.  Do not have throw rugs and other things on the floor that can make you trip. What can I do in the kitchen?  Clean up any spills right away.  Avoid walking on wet floors.  Keep items that you use a lot in easy-to-reach places.  If you need to reach something above you, use a strong step stool that has a grab bar.  Keep electrical cords out of the way.  Do not use floor polish or wax that makes floors slippery. If you must use wax, use non-skid floor wax.  Do not have throw rugs and other things on the floor that can make you trip. What can I do with my stairs?  Do not leave any items on the stairs.  Make sure that there are handrails on both sides of the stairs and use them. Fix handrails that are broken or loose. Make sure that handrails are as long as the stairways.  Check any carpeting to make sure that it is firmly attached to the stairs. Fix any carpet that is loose  or worn.  Avoid having throw rugs at the top or bottom of the stairs. If you do have throw rugs, attach them to the floor with carpet tape.  Make sure that you have a light switch at the top of the stairs and the bottom of the stairs. If you do not have them, ask someone to add them for you. What else can I do to help prevent falls?  Wear shoes that:  Do not have high heels.  Have rubber bottoms.  Are comfortable and fit you well.  Are closed at the toe. Do not wear sandals.  If you use a stepladder:  Make sure that it is fully opened. Do not climb a closed stepladder.  Make sure that both sides of the stepladder are locked into place.  Ask someone to hold it for you, if possible.  Clearly mark and make sure that you can see:  Any grab bars or handrails.  First and last steps.  Where the edge of each step is.  Use tools that help you move around (mobility aids) if they are needed. These include:  Canes.  Walkers.  Scooters.  Crutches.  Turn on the lights when you go into a dark area. Replace any light bulbs as soon as they burn out.  Set up your furniture so you have a clear path. Avoid moving your furniture around.  If any of your floors are uneven, fix them.  If there are any pets around you, be aware of where they are.  Review your medicines with your doctor. Some medicines can make you feel dizzy. This can increase your chance of falling. Ask your doctor what other things that you can do to help prevent falls. This information is not intended to replace advice given to you by your health care provider. Make sure you discuss any questions you have with your health care provider. Document Released: 11/23/2008 Document Revised: 07/05/2015 Document Reviewed: 03/03/2014 Elsevier Interactive Patient Education  2017 [employer].

## 2019-10-24 NOTE — Progress Notes (Addendum)
 I connected with Jocelyn Mendez today by telephone and verified that I am speaking with the correct person using two identifiers. Location patient: home Location provider: work Persons participating in the virtual visit: Jocelyn Mendez and  , LPN.   I discussed the limitations, risks, security and privacy concerns of performing an evaluation and management service by telephone and the availability of in person appointments. I also discussed with the patient that there may be a patient responsible charge related to this service. The patient expressed understanding and verbally consented to this telephonic visit.    Interactive audio and video telecommunications were attempted between this provider and patient, however failed, due to patient having technical difficulties OR patient did not have access to video capability.  We continued and completed visit with audio only.  Some vital signs may be absent or patient reported.   Time Spent with patient on telephone encounter: 25 minutes  Subjective:   Jocelyn Mendez is a 86 y.o. female who presents for Medicare Annual (Subsequent) preventive examination.  Review of Systems    No ROS. Medicare Wellness Visit Cardiac Risk Factors include: advanced age (>55men, >65 women);diabetes mellitus;dyslipidemia;family history of premature cardiovascular disease     Objective:    There were no vitals filed for this visit. There is no height or weight on file to calculate BMI.  Advanced Directives 10/24/2019 11/11/2018 03/05/2017 08/27/2016 08/23/2016 07/04/2015 10/05/2014  Does Patient Have a Medical Advance Directive? No No Yes No No No Yes  Type of Advance Directive - - Healthcare Power of Attorney;Living will - - - -  Copy of Healthcare Power of Attorney in Chart? - - No - copy requested - - - Yes  Would patient like information on creating a medical advance directive? No - Patient declined No - Patient declined - No - Patient declined No -  Patient declined No - patient declined information -  Pre-existing out of facility DNR order (yellow form or pink MOST form) - - - - - - -    Current Medications (verified) Outpatient Encounter Medications as of 10/24/2019  Medication Sig   Accu-Chek Softclix Lancets lancets 1 each by Other route 4 (four) times daily. E11.9   albuterol (PROVENTIL HFA;VENTOLIN HFA) 108 (90 BASE) MCG/ACT inhaler Inhale 2 puffs into the lungs every 6 (six) hours as needed for wheezing or shortness of breath.   amLODipine (NORVASC) 5 MG tablet TAKE 1 TABLET BY MOUTH EVERY DAY   aspirin 81 MG tablet Take 81 mg by mouth daily.     Blood Glucose Monitoring Suppl (ACCU-CHEK GUIDE ME) w/Device KIT 1 each by Does not apply route 4 (four) times daily. E11.9   busPIRone (BUSPAR) 5 MG tablet TAKE 1 TABLET BY MOUTH TWICE DAILY   Cholecalciferol (VITAMIN D3) 2000 UNITS TABS Take 2,000 Units by mouth 2 (two) times daily.    cyanocobalamin (,VITAMIN B-12,) 1000 MCG/ML injection INJECT ONE ML INTRAMUSCULARLY EVERY MONTH   fenofibrate 160 MG tablet TAKE 1 TABLET BY MOUTH DAILY   ferrous sulfate 325 (65 FE) MG EC tablet Take 325 mg by mouth 2 (two) times daily.    gabapentin (NEURONTIN) 100 MG capsule TAKE ONE CAPSULE BY MOUTH THREE TIMES DAILY   Insulin NPH, Human,, Isophane, (HUMULIN N KWIKPEN) 100 UNIT/ML Kiwkpen Inject 64 Units into the skin every morning.   latanoprost (XALATAN) 0.005 % ophthalmic solution Place 1 drop into both eyes at bedtime.   levothyroxine (SYNTHROID) 137 MCG tablet TAKE 1 TABLET BY MOUTH DAILY   BEFORE breakfast   lidocaine (LIDODERM) 5 % APPLY ONE PATCH TO SKIN EVERY DAY. REMOVE AND DISCARD PATCH WITHIN 12 HOURS OR AS DIRECTED BY MD - (Patient taking differently: Place 1 patch onto the skin daily. )   NEEDLE, DISP, 25 G (BD DISP NEEDLES) 25G X 5/8" MISC Use as directed to administer  the b12 injection monthly   neomycin-polymyxin-hydrocortisone (CORTISPORIN) OTIC solution Place 3 drops into both ears 3  (three) times daily.   ONETOUCH VERIO test strip USE TO TEST FOUR TIMES DAILY   polyethylene glycol (MIRALAX / GLYCOLAX) packet Take 17 g by mouth daily.   rosuvastatin (CRESTOR) 20 MG tablet Take 1 tablet (20 mg total) by mouth daily.   saccharomyces boulardii (FLORASTOR) 250 MG capsule Take 1 capsule (250 mg total) by mouth 2 (two) times daily.   solifenacin (VESICARE) 10 MG tablet Take 1 tablet (10 mg total) by mouth daily.   No facility-administered encounter medications on file as of 10/24/2019.    Allergies (verified) Codeine, Diphenhydramine hcl, Quinine, and Sulfonamide derivatives   History: Past Medical History:  Diagnosis Date   Acute bronchitis    Anemia of other chronic disease    Anxiety state, unspecified    Chest pain, unspecified    Diverticulosis of colon (without mention of hemorrhage)    Esophageal reflux    Gallstones    Generalized osteoarthrosis, unspecified site    Headache(784.0)    Lumbago    Neuropathy in diabetes ([place])    bilat LE's   Osteoporosis    Other and unspecified hyperlipidemia    Other B-complex deficiencies    Type II or unspecified type diabetes mellitus without mention of complication, not stated as uncontrolled    Unspecified disorder resulting from impaired renal function    Unspecified essential hypertension    Unspecified hypothyroidism    Past Surgical History:  Procedure Laterality Date   APPENDECTOMY     THYROIDECTOMY     Family History  Problem Relation Age of Onset   Diabetes Mother    Cancer Father        unsure ? stomach   Heart attack Brother        x 3   Cancer Sister        unsure type   Lung cancer Brother        smoker   Heart attack Sister    Social History   Socioeconomic History   Marital status: Divorced    Spouse name: Not on file   Number of children: 6   Years of education: Not on file   Highest education level: Not on file  Occupational History   Occupation: retired  Tobacco Use   Smoking  status: Former Smoker    Packs/day: 0.50    Years: 6.00    Pack years: 3.00    Types: Cigarettes    Quit date: 02/10/1973    Years since quitting: 46.7   Smokeless tobacco: Former Systems developer    Types: Snuff    Quit date: 04/07/2010  Vaping Use   Vaping Use: Never used  Substance and Sexual Activity   Alcohol use: No    Alcohol/week: 0.0 standard drinks   Drug use: No   Sexual activity: Never  Other Topics Concern   Not on file  Social History Narrative   1 child passed away at 13 weeks old---?crib death   Dtr; [name]; Son lives with her [name]; [name]; roger;    Social Determinants  of Health   Financial Resource Strain: Low Risk    Difficulty of Paying Living Expenses: Not hard at all  Food Insecurity: No Food Insecurity   Worried About [place] in the Last Year: Never true   [place] in the Last Year: Never true  Transportation Needs: No Transportation Needs   Lack of Transportation (Medical): No   Lack of Transportation (Non-Medical): No  Physical Activity: Insufficiently Active   Days of Exercise per Week: 7 days   Minutes of Exercise per Session: 20 min  Stress: No Stress Concern Present   Feeling of Stress : Not at all  Social Connections:    Frequency of Communication with Friends and Family: Not on file   Frequency of Social Gatherings with Friends and Family: Not on file   Attends Religious Services: Not on Electrical engineer or Organizations: Not on file   Attends Archivist Meetings: Not on file   Marital Status: Not on file    Tobacco Counseling Counseling given: Not Answered   Clinical Intake:  Pre-visit preparation completed: Yes  Pain : No/denies pain     Nutritional Risks: None Diabetes: Yes CBG done?: No Did pt. bring in CBG monitor from home?: No  How often do you need to have someone help you when you read instructions, pamphlets, or other written materials from your doctor or pharmacy?: 1 -  Never What is the last grade level you completed in school?: 9th grade  Diabetic? yes  Interpreter Needed?: No  Information entered by :: Lisette Abu, LPN   Activities of Daily Living In your present state of health, do you have any difficulty performing the following activities: 10/24/2019 11/12/2018  Hearing? Y N  Vision? N N  Difficulty concentrating or making decisions? Y N  Walking or climbing stairs? Y N  Comment uses a walker -  Dressing or bathing? N N  Doing errands, shopping? Tempie Donning  Preparing Food and eating ? N -  Using the Toilet? N -  In the past six months, have you accidently leaked urine? Y -  Comment wears Depends -  Do you have problems with loss of bowel control? Y -  Comment wears Depends -  Managing your Medications? Y -  Managing your Finances? Y -  Housekeeping or managing your Housekeeping? Y -  Some recent data might be hidden    Patient Care Team: Hoyt Koch, MD as PCP - General (Internal Medicine) Renato Shin, MD as Consulting Physician (Endocrinology)  Indicate any recent Medical Services you may have received from other than [facility] providers in the past year (date may be approximate).     Assessment:   This is a routine wellness examination for Jocelyn Mendez.  Hearing/Vision screen No exam data present  Dietary issues and exercise activities discussed: Current Exercise Habits: Home exercise routine, Type of exercise: stretching, Time (Minutes): 20, Frequency (Times/Week): 7, Weekly Exercise (Minutes/Week): 140, Intensity: Mild  Goals       Patient Stated      I want to get my strength back and maintain my home. Continue to be a little more active each day until I am back to baseline.      stay independent (pt-stated)      Maintains exercise; walks to dtr'; do chair exercises on porch almost every day;  Each exercise x 3 minutes; legs and arms; Does the twist x 5 min  Depression Screen PHQ 2/9 Scores 10/24/2019  08/01/2019 04/12/2018 03/05/2017 10/29/2016 10/17/2015 10/05/2014  PHQ - 2 Score 1 1 0 2 1 0 0  PHQ- 9 Score - 3 - 5 - - -    Fall Risk Fall Risk  10/24/2019 08/01/2019 04/12/2018 03/05/2017 10/29/2016  Falls in the past year? 0 0 0 No No  Number falls in past yr: 0 - - - -  Injury with Fall? 0 - - - -  Risk for fall due to : Impaired balance/gait - - - -  Follow up Falls evaluation completed - - - -    Any stairs in or around the home? No  If so, are there any without handrails? No  Home free of loose throw rugs in walkways, pet beds, electrical cords, etc? Yes  Adequate lighting in your home to reduce risk of falls? Yes   ASSISTIVE DEVICES UTILIZED TO PREVENT FALLS:  Life alert? No  Use of a cane, walker or w/c? Yes  Grab bars in the bathroom? No  Shower chair or bench in shower? No  Elevated toilet seat or a handicapped toilet? No   TIMED UP AND GO:  Was the test performed? No .  Length of time to ambulate 10 feet: 0 sec.   Gait slow and steady with assistive device  Cognitive Function: MMSE - Mini Mental State Exam 10/24/2019 03/05/2017  Not completed: Unable to complete -  Orientation to time - 5  Orientation to Place - 5  Registration - 3  Attention/ Calculation - 4  Recall - 2  Language- name 2 objects - 2  Language- repeat - 1  Language- follow 3 step command - 3  Language- read & follow direction - 1  Write a sentence - 1  Copy design - 1  Total score - 28        Immunizations Immunization History  Administered Date(s) Administered   Fluad Quad(high Dose 65+) 11/10/2018   Influenza Split 10/16/2011   Influenza Whole 01/21/2007, 11/23/2007, 11/22/2008, 11/12/2009   Influenza, High Dose Seasonal PF 10/05/2014, 10/29/2016   Influenza,inj,Quad PF,6+ Mos 11/16/2012, 10/17/2015   Influenza-Unspecified 12/01/2017   Moderna SARS-COVID-2 Vaccination 05/09/2019, 05/30/2019   Pneumococcal Conjugate-13 10/05/2014   Pneumococcal Polysaccharide-23 02/10/2009, 08/24/2016     TDAP status: Due, Education has been provided regarding the importance of this vaccine. Advised may receive this vaccine at local pharmacy or Health Dept. Aware to provide a copy of the vaccination record if obtained from local pharmacy or Health Dept. Verbalized acceptance and understanding. Flu Vaccine status: Up to date Pneumococcal vaccine status: Up to date Covid-19 vaccine status: Completed vaccines  Qualifies for Shingles Vaccine? Yes   Zostavax completed No   Shingrix Completed?: No.    Education has been provided regarding the importance of this vaccine. Patient has been advised to call insurance company to determine out of pocket expense if they have not yet received this vaccine. Advised may also receive vaccine at local pharmacy or Health Dept. Verbalized acceptance and understanding.  Screening Tests Health Maintenance  Topic Date Due   DEXA SCAN  Never done   URINE MICROALBUMIN  09/21/2014   OPHTHALMOLOGY EXAM  03/17/2019   INFLUENZA VACCINE  09/11/2019   TETANUS/TDAP  03/04/2020 (Originally 12/08/1949)   HEMOGLOBIN A1C  04/17/2020   FOOT EXAM  10/18/2020   COVID-19 Vaccine  Completed   PNA vac Low Risk Adult  Completed    Health Maintenance  Health Maintenance Due  Topic Date Due     DEXA SCAN  Never done   URINE MICROALBUMIN  09/21/2014   OPHTHALMOLOGY EXAM  03/17/2019   INFLUENZA VACCINE  09/11/2019    Colorectal cancer screening: No longer required.  Mammogram status: No longer required.  Bone Density status: Never done  Lung Cancer Screening: (Low Dose CT Chest recommended if Age 55-80 years, 30 pack-year currently smoking OR have quit w/in 15years.) does not qualify.   Lung Cancer Screening Referral: no  Additional Screening:  Hepatitis C Screening: does not qualify; Completed no  Vision Screening: Recommended annual ophthalmology exams for early detection of glaucoma and other disorders of the eye. Is the patient up to date with their annual eye  exam?  Yes  Who is the provider or what is the name of the office in which the patient attends annual eye exams? [facility] If pt is not established with a provider, would they like to be referred to a provider to establish care? No .   Dental Screening: Recommended annual dental exams for proper oral hygiene  Community Resource Referral / Chronic Care Management: CRR required this visit?  No   CCM required this visit?  No      Plan:     I have personally reviewed and noted the following in the patient's chart:   Medical and social history Use of alcohol, tobacco or illicit drugs  Current medications and supplements Functional ability and status Nutritional status Physical activity Advanced directives List of other physicians Hospitalizations, surgeries, and ER visits in previous 12 months Vitals Screenings to include cognitive, depression, and falls Referrals and appointments  In addition, I have reviewed and discussed with patient certain preventive protocols, quality metrics, and best practice recommendations. A written personalized care plan for preventive services as well as general preventive health recommendations were provided to patient.      N , LPN   10/24/2019   Nurse Notes:  Patient is cogitatively intact.  There were no vitals filed for this visit. There is no height or weight on file to calculate BMI. Patient stated that she has issues with gait and balance; does use a walker for assistance.   Medical screening examination/treatment/procedure(s) were performed by non-physician practitioner and as supervising physician I was immediately available for consultation/collaboration.  I agree with above. Aleksei Plotnikov, MD   

## 2019-11-01 ENCOUNTER — Ambulatory Visit: Payer: Medicare Other | Admitting: Internal Medicine

## 2019-11-05 ENCOUNTER — Other Ambulatory Visit: Payer: Self-pay | Admitting: Endocrinology

## 2019-11-05 DIAGNOSIS — E1122 Type 2 diabetes mellitus with diabetic chronic kidney disease: Secondary | ICD-10-CM

## 2019-12-11 ENCOUNTER — Other Ambulatory Visit: Payer: Self-pay | Admitting: Internal Medicine

## 2019-12-14 ENCOUNTER — Other Ambulatory Visit: Payer: Self-pay | Admitting: Endocrinology

## 2019-12-14 DIAGNOSIS — E1122 Type 2 diabetes mellitus with diabetic chronic kidney disease: Secondary | ICD-10-CM

## 2020-01-10 ENCOUNTER — Other Ambulatory Visit: Payer: Self-pay | Admitting: Internal Medicine

## 2020-02-08 ENCOUNTER — Other Ambulatory Visit: Payer: Self-pay | Admitting: Internal Medicine

## 2020-02-08 ENCOUNTER — Other Ambulatory Visit: Payer: Self-pay | Admitting: Endocrinology

## 2020-02-08 DIAGNOSIS — N1832 Chronic kidney disease, stage 3b: Secondary | ICD-10-CM

## 2020-02-22 ENCOUNTER — Encounter: Payer: Self-pay | Admitting: Endocrinology

## 2020-02-22 ENCOUNTER — Ambulatory Visit (INDEPENDENT_AMBULATORY_CARE_PROVIDER_SITE_OTHER): Payer: Medicare Other | Admitting: Endocrinology

## 2020-02-22 ENCOUNTER — Other Ambulatory Visit: Payer: Self-pay

## 2020-02-22 VITALS — BP 124/68 | HR 68 | Ht 66.0 in | Wt 159.0 lb

## 2020-02-22 DIAGNOSIS — Z23 Encounter for immunization: Secondary | ICD-10-CM

## 2020-02-22 DIAGNOSIS — Z794 Long term (current) use of insulin: Secondary | ICD-10-CM

## 2020-02-22 DIAGNOSIS — E1122 Type 2 diabetes mellitus with diabetic chronic kidney disease: Secondary | ICD-10-CM | POA: Diagnosis not present

## 2020-02-22 DIAGNOSIS — N183 Chronic kidney disease, stage 3 unspecified: Secondary | ICD-10-CM

## 2020-02-22 DIAGNOSIS — N1832 Chronic kidney disease, stage 3b: Secondary | ICD-10-CM

## 2020-02-22 LAB — BASIC METABOLIC PANEL
BUN: 35 mg/dL — ABNORMAL HIGH (ref 6–23)
CO2: 23 mEq/L (ref 19–32)
Calcium: 9.3 mg/dL (ref 8.4–10.5)
Chloride: 109 mEq/L (ref 96–112)
Creatinine, Ser: 1.79 mg/dL — ABNORMAL HIGH (ref 0.40–1.20)
GFR: 25.24 mL/min — ABNORMAL LOW (ref >60.00)
Glucose, Bld: 56 mg/dL — ABNORMAL LOW (ref 70–99)
Potassium: 4.2 mEq/L (ref 3.5–5.1)
Sodium: 138 mEq/L (ref 135–145)

## 2020-02-22 LAB — POCT GLYCOSYLATED HEMOGLOBIN (HGB A1C): Hemoglobin A1C: 5.7 % — AB (ref 4.0–5.6)

## 2020-02-22 LAB — T4, FREE: Free T4: 1.04 ng/dL (ref 0.60–1.60)

## 2020-02-22 LAB — TSH: TSH: 2.23 u[IU]/mL (ref 0.35–4.50)

## 2020-02-22 MED ORDER — HUMULIN N KWIKPEN 100 UNIT/ML ~~LOC~~ SUPN
58.0000 [IU] | PEN_INJECTOR | SUBCUTANEOUS | 3 refills | Status: DC
Start: 2020-02-22 — End: 2020-04-25

## 2020-02-22 NOTE — Progress Notes (Signed)
Subjective:    Patient ID: Jocelyn Mendez, female    DOB: [birth year], 87 y.o.   MRN: [id number]  HPI Pt returns for f/u of diabetes mellitus:  DM type: Insulin-requiring type 2.  Dx'ed: 1988.   Complications: PN and stage 4 CRI Therapy: insulin since 2012.  GDM: never.  DKA: never Severe hypoglycemia: never.   Pancreatitis: never.   SDOH: She takes her own insulin, and checks her own cbg.   Other: she declines multiple daily injections; she uses pen, due to visual problems; on levemir, she had am hypoglycemia and pm hyperglycemia, so she was changed to QAM NPH.   Interval history:  pt states she feels well in general.  no cbg record, but states it varies from from 56-301.  It is in general higher as the day goes on.  She says she never misses the insulin.  No recent steroids. She takes 64 units qam.   Past Medical History:  Diagnosis Date  . Acute bronchitis   . Anemia of other chronic disease   . Anxiety state, unspecified   . Chest pain, unspecified   . Diverticulosis of colon (without mention of hemorrhage)   . Esophageal reflux   . Gallstones   . Generalized osteoarthrosis, unspecified site   . Headache(784.0)   . Lumbago   . Neuropathy in diabetes ([place])    bilat LE's  . Osteoporosis   . Other and unspecified hyperlipidemia   . Other B-complex deficiencies   . Type II or unspecified type diabetes mellitus without mention of complication, not stated as uncontrolled   . Unspecified disorder resulting from impaired renal function   . Unspecified essential hypertension   . Unspecified hypothyroidism     Past Surgical History:  Procedure Laterality Date  . APPENDECTOMY    . THYROIDECTOMY      Social History   Socioeconomic History  . Marital status: Divorced    Spouse name: Not on file  . Number of children: 6  . Years of education: Not on file  . Highest education level: Not on file  Occupational History  . Occupation: retired  Tobacco Use  . Smoking  status: Former Smoker    Packs/day: 0.50    Years: 6.00    Pack years: 3.00    Types: Cigarettes    Quit date: 02/10/1973    Years since quitting: 47.0  . Smokeless tobacco: Former Systems developer    Types: Snuff    Quit date: 04/07/2010  Vaping Use  . Vaping Use: Never used  Substance and Sexual Activity  . Alcohol use: No    Alcohol/week: 0.0 standard drinks  . Drug use: No  . Sexual activity: Never  Other Topics Concern  . Not on file  Social History Narrative   1 child passed away at 71 weeks old---?crib death   Dtr; Jocelyn Mendez; Son lives with her Jocelyn Mendez; Jocelyn Mendez; roger;    Social Determinants of Health   Financial Resource Strain: Low Risk   . Difficulty of Paying Living Expenses: Not hard at all  Food Insecurity: No Food Insecurity  . Worried About Charity fundraiser in the Last Year: Never true  . Ran Out of Food in the Last Year: Never true  Transportation Needs: No Transportation Needs  . Lack of Transportation (Medical): No  . Lack of Transportation (Non-Medical): No  Physical Activity: Insufficiently Active  . Days of Exercise per Week: 7 days  . Minutes of Exercise per Session:  20 min  Stress: No Stress Concern Present  . Feeling of Stress : Not at all  Social Connections: Not on file  Intimate Partner Violence: Not At Risk  . Fear of Current or Ex-Partner: No  . Emotionally Abused: No  . Physically Abused: No  . Sexually Abused: No    Current Outpatient Medications on File Prior to Visit  Medication Sig Dispense Refill  . albuterol (PROVENTIL HFA;VENTOLIN HFA) 108 (90 BASE) MCG/ACT inhaler Inhale 2 puffs into the lungs every 6 (six) hours as needed for wheezing or shortness of breath. 1 Inhaler 6  . amLODipine (NORVASC) 5 MG tablet TAKE 1 TABLET BY MOUTH EVERY DAY 90 tablet 1  . aspirin 81 MG tablet Take 81 mg by mouth daily.    . Blood Glucose Monitoring Suppl (ACCU-CHEK GUIDE ME) w/Device KIT 1 each by Does not apply route 4 (four) times daily. E11.9 1 kit  0  . busPIRone (BUSPAR) 5 MG tablet TAKE 1 TABLET BY MOUTH TWICE DAILY 60 tablet 5  . Cholecalciferol (VITAMIN D3) 2000 UNITS TABS Take 2,000 Units by mouth 2 (two) times daily.     . cyanocobalamin (,VITAMIN B-12,) 1000 MCG/ML injection INJECT ONE ML INTRAMUSCULARLY EVERY MONTH 1 mL 1  . fenofibrate 160 MG tablet TAKE 1 TABLET BY MOUTH DAILY 30 tablet 1  . ferrous sulfate 325 (65 FE) MG EC tablet Take 325 mg by mouth 2 (two) times daily.    Marland Kitchen gabapentin (NEURONTIN) 100 MG capsule TAKE ONE CAPSULE BY MOUTH THREE TIMES DAILY 90 capsule 1  . Lancets (ONETOUCH DELICA PLUS [id number]) MISC USE FOUR TIMES DAILY AS DIRECTED 300 each 1  . latanoprost (XALATAN) 0.005 % ophthalmic solution Place 1 drop into both eyes at bedtime.  11  . levothyroxine (SYNTHROID) 137 MCG tablet TAKE 1 TABLET BY MOUTH DAILY BEFORE breakfast 90 tablet 3  . lidocaine (LIDODERM) 5 % APPLY ONE PATCH TO SKIN EVERY DAY. REMOVE AND DISCARD PATCH WITHIN 12 HOURS OR AS DIRECTED BY MD - (Patient taking differently: Place 1 patch onto the skin daily.) 30 patch 0  . NEEDLE, DISP, 25 G (BD DISP NEEDLES) 25G X 5/8" MISC Use as directed to administer  the b12 injection monthly 50 each 2  . neomycin-polymyxin-hydrocortisone (CORTISPORIN) OTIC solution Place 3 drops into both ears 3 (three) times daily. 10 mL 0  . ONETOUCH VERIO test strip USE TO TEST FOUR TIMES DAILY 360 strip 0  . polyethylene glycol (MIRALAX / GLYCOLAX) packet Take 17 g by mouth daily. 14 each 0  . rosuvastatin (CRESTOR) 20 MG tablet Take 1 tablet (20 mg total) by mouth daily. 90 tablet 3  . saccharomyces boulardii (FLORASTOR) 250 MG capsule Take 1 capsule (250 mg total) by mouth 2 (two) times daily. 60 capsule 0  . solifenacin (VESICARE) 10 MG tablet Take 1 tablet (10 mg total) by mouth daily. 90 tablet 1   No current facility-administered medications on file prior to visit.    Allergies  Allergen Reactions  . Codeine Other (See Comments)    REACTION: change in  mental status  . Diphenhydramine Hcl Itching  . Quinine Other (See Comments)    REACTION: abnormal sensations in face  . Sulfonamide Derivatives Rash    Family History  Problem Relation Age of Onset  . Diabetes Mother   . Cancer Father        unsure ? stomach  . Heart attack Brother        x 3  .  Cancer Sister        unsure type  . Lung cancer Brother        smoker  . Heart attack Sister     BP 124/68   Pulse 68   Ht '5\' 6"'  (1.676 m)   Wt 159 lb (72.1 kg)   SpO2 97%   BMI 25.66 kg/m    Review of Systems Denies LOC    Objective:   Physical Exam VITAL SIGNS:  See vs page GENERAL: no distress Pulses: dorsalis pedis intact bilat.   MSK: no deformity of the feet CV: no leg edema, but there are bilat vv's. Skin:  no ulcer on the feet, but there is a abrasion at the lat aspect of the left 2nd toe (pt says cut it while clipping toenails).   normal color and temp on the feet.   Neuro: sensation is intact to touch on the feet Ext: there is bilateral onychomycosis of the toenails.    A1c=5.7%    Assessment & Plan:  Insulin-requiring type 2 DM, with stage 4 CRI.   Hypoglycemia, due to insulin.    Patient Instructions  check your blood sugar twice a day.  vary the time of day when you check, between before the 3 meals, and at bedtime.  also check if you have symptoms of your blood sugar being too high or too low.  please keep a record of the readings and bring it to your next appointment here.  You can write it on any piece of paper.  please call us sooner if your blood sugar goes below 70, or if you have a lot of readings over 200.   Please reduce the insulin to 58 units each morning.   On this type of insulin schedule, you should eat meals on a regular schedule.  If a meal is missed or significantly delayed, your blood sugar could go low.   Keep the scrape on your toe covered with antibiotic ointment and large bandaids, until it heals.  Blood tests are requested for you  today.  We'll let you know about the results.  Please come back for a follow-up appointment in 2 months.

## 2020-02-22 NOTE — Patient Instructions (Addendum)
check your blood sugar twice a day.  vary the time of day when you check, between before the 3 meals, and at bedtime.  also check if you have symptoms of your blood sugar being too high or too low.  please keep a record of the readings and bring it to your next appointment here.  You can write it on any piece of paper.  please call us sooner if your blood sugar goes below 70, or if you have a lot of readings over 200.   Please reduce the insulin to 58 units each morning.   On this type of insulin schedule, you should eat meals on a regular schedule.  If a meal is missed or significantly delayed, your blood sugar could go low.   Keep the scrape on your toe covered with antibiotic ointment and large bandaids, until it heals.  Blood tests are requested for you today.  We'll let you know about the results.  Please come back for a follow-up appointment in 2 months.

## 2020-03-11 ENCOUNTER — Other Ambulatory Visit: Payer: Self-pay | Admitting: Internal Medicine

## 2020-03-13 ENCOUNTER — Other Ambulatory Visit: Payer: Self-pay | Admitting: Endocrinology

## 2020-03-13 DIAGNOSIS — E1122 Type 2 diabetes mellitus with diabetic chronic kidney disease: Secondary | ICD-10-CM

## 2020-03-13 DIAGNOSIS — N1832 Chronic kidney disease, stage 3b: Secondary | ICD-10-CM

## 2020-04-09 ENCOUNTER — Other Ambulatory Visit: Payer: Self-pay | Admitting: Internal Medicine

## 2020-04-25 ENCOUNTER — Ambulatory Visit (INDEPENDENT_AMBULATORY_CARE_PROVIDER_SITE_OTHER): Payer: Medicare Other | Admitting: Endocrinology

## 2020-04-25 ENCOUNTER — Other Ambulatory Visit: Payer: Self-pay

## 2020-04-25 VITALS — BP 160/64 | HR 82 | Ht 66.0 in | Wt 152.2 lb

## 2020-04-25 DIAGNOSIS — N1832 Chronic kidney disease, stage 3b: Secondary | ICD-10-CM

## 2020-04-25 DIAGNOSIS — N183 Chronic kidney disease, stage 3 unspecified: Secondary | ICD-10-CM | POA: Diagnosis not present

## 2020-04-25 DIAGNOSIS — E1122 Type 2 diabetes mellitus with diabetic chronic kidney disease: Secondary | ICD-10-CM

## 2020-04-25 DIAGNOSIS — Z794 Long term (current) use of insulin: Secondary | ICD-10-CM | POA: Diagnosis not present

## 2020-04-25 MED ORDER — HUMULIN N KWIKPEN 100 UNIT/ML ~~LOC~~ SUPN: 54.0000 [IU] | PEN_INJECTOR | SUBCUTANEOUS | 3 refills | Status: DC

## 2020-04-25 NOTE — Patient Instructions (Addendum)
Your blood pressure is high today.  Please see your primary care provider soon, to have it rechecked check your blood sugar twice a day.  vary the time of day when you check, between before the 3 meals, and at bedtime.  also check if you have symptoms of your blood sugar being too high or too low.  please keep a record of the readings and bring it to your next appointment here.  You can write it on any piece of paper.  please call us sooner if your blood sugar goes below 70, or if you have a lot of readings over 200.   Please reduce the insulin to 54 units each morning.     On this type of insulin schedule, you should eat meals on a regular schedule.  If a meal is missed or significantly delayed, your blood sugar could go low.   Please come back for a follow-up appointment in 2 months.

## 2020-04-25 NOTE — Progress Notes (Signed)
Subjective:    Patient ID: Jocelyn Mendez, female    DOB: [birth year], 87 y.o.   MRN: [id number]  HPI Pt returns for f/u of diabetes mellitus:  DM type: Insulin-requiring type 2.  Dx'ed: 1988.   Complications: PN and stage 4 CRI.   Therapy: insulin since 2012.  GDM: never.  DKA: never Severe hypoglycemia: never.   Pancreatitis: never.   SDOH: She takes her own insulin, and checks her own cbg.   Other: she declines multiple daily injections; she uses pen, due to visual problems; on levemir, she had am hypoglycemia and pm hyperglycemia, so she was changed to QAM NPH.   Interval history:  pt states she feels well in general.  no cbg record, but states it varies from from 68-182.  It is in general higher as the day goes on.  She says she never misses the insulin.  No recent steroids. She takes 58 units qam.   Past Medical History:  Diagnosis Date  . Acute bronchitis   . Anemia of other chronic disease   . Anxiety state, unspecified   . Chest pain, unspecified   . Diverticulosis of colon (without mention of hemorrhage)   . Esophageal reflux   . Gallstones   . Generalized osteoarthrosis, unspecified site   . Headache(784.0)   . Lumbago   . Neuropathy in diabetes ([place])    bilat LE's  . Osteoporosis   . Other and unspecified hyperlipidemia   . Other B-complex deficiencies   . Type II or unspecified type diabetes mellitus without mention of complication, not stated as uncontrolled   . Unspecified disorder resulting from impaired renal function   . Unspecified essential hypertension   . Unspecified hypothyroidism     Past Surgical History:  Procedure Laterality Date  . APPENDECTOMY    . THYROIDECTOMY      Social History   Socioeconomic History  . Marital status: Divorced    Spouse name: Not on file  . Number of children: 6  . Years of education: Not on file  . Highest education level: Not on file  Occupational History  . Occupation: retired  Tobacco Use  . Smoking  status: Former Smoker    Packs/day: 0.50    Years: 6.00    Pack years: 3.00    Types: Cigarettes    Quit date: 02/10/1973    Years since quitting: 47.2  . Smokeless tobacco: Former Systems developer    Types: Snuff    Quit date: 04/07/2010  Vaping Use  . Vaping Use: Never used  Substance and Sexual Activity  . Alcohol use: No    Alcohol/week: 0.0 standard drinks  . Drug use: No  . Sexual activity: Never  Other Topics Concern  . Not on file  Social History Narrative   1 child passed away at 36 weeks old---?crib death   Dtr; Jocelyn Mendez; Son lives with her Jocelyn Mendez; Jocelyn Mendez; Jocelyn Mendez;    Social Determinants of Health   Financial Resource Strain: Low Risk   . Difficulty of Paying Living Expenses: Not hard at all  Food Insecurity: No Food Insecurity  . Worried About Charity fundraiser in the Last Year: Never true  . Ran Out of Food in the Last Year: Never true  Transportation Needs: No Transportation Needs  . Lack of Transportation (Medical): No  . Lack of Transportation (Non-Medical): No  Physical Activity: Insufficiently Active  . Days of Exercise per Week: 7 days  . Minutes of Exercise  per Session: 20 min  Stress: No Stress Concern Present  . Feeling of Stress : Not at all  Social Connections: Not on file  Intimate Partner Violence: Not At Risk  . Fear of Current or Ex-Partner: No  . Emotionally Abused: No  . Physically Abused: No  . Sexually Abused: No    Current Outpatient Medications on File Prior to Visit  Medication Sig Dispense Refill  . albuterol (PROVENTIL HFA;VENTOLIN HFA) 108 (90 BASE) MCG/ACT inhaler Inhale 2 puffs into the lungs every 6 (six) hours as needed for wheezing or shortness of breath. 1 Inhaler 6  . amLODipine (NORVASC) 5 MG tablet TAKE 1 TABLET BY MOUTH EVERY DAY 90 tablet 1  . aspirin 81 MG tablet Take 81 mg by mouth daily.    . Blood Glucose Monitoring Suppl (ACCU-CHEK GUIDE ME) w/Device KIT 1 each by Does not apply route 4 (four) times daily. E11.9 1 kit  0  . Cholecalciferol (VITAMIN D3) 2000 UNITS TABS Take 2,000 Units by mouth 2 (two) times daily.     . cyanocobalamin (,VITAMIN B-12,) 1000 MCG/ML injection INJECT ONE ML INTRAMUSCULARLY EVERY MONTH 1 mL 1  . fenofibrate 160 MG tablet TAKE 1 TABLET BY MOUTH DAILY 30 tablet 1  . ferrous sulfate 325 (65 FE) MG EC tablet Take 325 mg by mouth 2 (two) times daily.    Marland Kitchen gabapentin (NEURONTIN) 100 MG capsule TAKE ONE CAPSULE BY MOUTH THREE TIMES DAILY 90 capsule 1  . Lancets (ONETOUCH DELICA PLUS [id number]) MISC USE FOUR TIMES DAILY AS DIRECTED 300 each 1  . latanoprost (XALATAN) 0.005 % ophthalmic solution Place 1 drop into both eyes at bedtime.  11  . levothyroxine (SYNTHROID) 137 MCG tablet TAKE 1 TABLET BY MOUTH DAILY BEFORE breakfast 90 tablet 3  . lidocaine (LIDODERM) 5 % APPLY ONE PATCH TO SKIN EVERY DAY. REMOVE AND DISCARD PATCH WITHIN 12 HOURS OR AS DIRECTED BY MD - (Patient taking differently: Place 1 patch onto the skin daily.) 30 patch 0  . NEEDLE, DISP, 25 G (BD DISP NEEDLES) 25G X 5/8" MISC Use as directed to administer  the b12 injection monthly 50 each 2  . neomycin-polymyxin-hydrocortisone (CORTISPORIN) OTIC solution Place 3 drops into both ears 3 (three) times daily. 10 mL 0  . ONETOUCH VERIO test strip USE TO TEST FOUR TIMES DAILY 100 strip 5  . polyethylene glycol (MIRALAX / GLYCOLAX) packet Take 17 g by mouth daily. 14 each 0  . saccharomyces boulardii (FLORASTOR) 250 MG capsule Take 1 capsule (250 mg total) by mouth 2 (two) times daily. 60 capsule 0   No current facility-administered medications on file prior to visit.    Allergies  Allergen Reactions  . Codeine Other (See Comments)    REACTION: change in mental status  . Diphenhydramine Hcl Itching  . Quinine Other (See Comments)    REACTION: abnormal sensations in face  . Sulfonamide Derivatives Rash    Family History  Problem Relation Age of Onset  . Diabetes Mother   . Cancer Father        unsure ? stomach  .  Heart attack Brother        x 3  . Cancer Sister        unsure type  . Lung cancer Brother        smoker  . Heart attack Sister     BP (!) 160/64 (BP Location: Right Arm, Patient Position: Sitting, Cuff Size: Normal)   Pulse 82   Ht  '5\' 6"'  (1.676 m)   Wt 152 lb 3.2 oz (69 kg)   SpO2 95%   BMI 24.57 kg/m    Review of Systems Denies LOC    Objective:   Physical Exam VITAL SIGNS:  See vs page GENERAL: no distress Pulses: dorsalis pedis intact bilat.   MSK: no deformity of the feet CV: no leg edema, but there are bilat vv's. Skin:  no ulcer on the feet.  normal color and temp on the feet.   Neuro: sensation is intact to touch on the feet Ext: there is bilateral onychomycosis of the toenails.   Lab Results  Component Value Date   HGBA1C 5.7 (A) 02/22/2020       Assessment & Plan:  HTN: is noted today Insulin-requiring type 2 DM, with stage 4 CRI Hypoglycemia, due to insulin: this limits aggressiveness of glycemic control.   Patient Instructions  Your blood pressure is high today.  Please see your primary care provider soon, to have it rechecked check your blood sugar twice a day.  vary the time of day when you check, between before the 3 meals, and at bedtime.  also check if you have symptoms of your blood sugar being too high or too low.  please keep a record of the readings and bring it to your next appointment here.  You can write it on any piece of paper.  please call us sooner if your blood sugar goes below 70, or if you have a lot of readings over 200.   Please reduce the insulin to 54 units each morning.     On this type of insulin schedule, you should eat meals on a regular schedule.  If a meal is missed or significantly delayed, your blood sugar could go low.   Please come back for a follow-up appointment in 2 months.

## 2020-04-27 ENCOUNTER — Encounter: Payer: Self-pay | Admitting: Internal Medicine

## 2020-04-27 ENCOUNTER — Other Ambulatory Visit: Payer: Self-pay

## 2020-04-27 ENCOUNTER — Ambulatory Visit (INDEPENDENT_AMBULATORY_CARE_PROVIDER_SITE_OTHER): Payer: Medicare Other | Admitting: Internal Medicine

## 2020-04-27 ENCOUNTER — Other Ambulatory Visit: Payer: Self-pay | Admitting: Internal Medicine

## 2020-04-27 DIAGNOSIS — N3941 Urge incontinence: Secondary | ICD-10-CM | POA: Diagnosis not present

## 2020-04-27 MED ORDER — MIRABEGRON ER 50 MG PO TB24: 50.0000 mg | ORAL_TABLET | Freq: Every day | ORAL | 1 refills | Status: DC

## 2020-04-27 NOTE — Assessment & Plan Note (Signed)
Rx myrbetriq and if no improvement will need referral to urology as she has tried and failed oxybutynin and vesicare also.

## 2020-04-27 NOTE — Progress Notes (Signed)
   Subjective:   Patient ID: Jocelyn Mendez, female    DOB: [birth year], 87 y.o.   MRN: [id number]  HPI The patient is an 87 YO female coming in for urinary incontinence. She is not able to make it to the bathroom and sometimes does not know when she will urinate. Large volume loss at times. She denies pain with urination. Has tried oxybutynin without relief and vesicare without relief. She would like to try something else. Denies taking anything now.   Review of Systems  Constitutional: Negative.   HENT: Negative.   Eyes: Negative.   Respiratory: Negative for cough, chest tightness and shortness of breath.   Cardiovascular: Negative for chest pain, palpitations and leg swelling.  Gastrointestinal: Negative for abdominal distention, abdominal pain, constipation, diarrhea, nausea and vomiting.  Genitourinary: Positive for urgency.  Musculoskeletal: Negative.   Skin: Negative.   Neurological: Negative.   Psychiatric/Behavioral: Negative.     Objective:  Physical Exam Constitutional:      Appearance: She is well-developed.  HENT:     Head: Normocephalic and atraumatic.  Cardiovascular:     Rate and Rhythm: Normal rate and regular rhythm.  Pulmonary:     Effort: Pulmonary effort is normal. No respiratory distress.     Breath sounds: Normal breath sounds. No wheezing or rales.  Abdominal:     General: Bowel sounds are normal. There is no distension.     Palpations: Abdomen is soft.     Tenderness: There is no abdominal tenderness. There is no rebound.  Musculoskeletal:     Cervical back: Normal range of motion.  Skin:    General: Skin is warm and dry.  Neurological:     Mental Status: She is alert and oriented to person, place, and time.     Coordination: Coordination normal.     Vitals:   04/27/20 1332  BP: 126/66  Pulse: 87  Resp: 18  Temp: 98.9 F (37.2 C)  TempSrc: Oral  SpO2: 97%  Weight: 151 lb 3.2 oz (68.6 kg)  Height: '5\' 6"'$  (1.676 m)    This visit  occurred during the SARS-CoV-2 public health emergency.  Safety protocols were in place, including screening questions prior to the visit, additional usage of staff PPE, and extensive cleaning of exam room while observing appropriate contact time as indicated for disinfecting solutions.   Assessment & Plan:

## 2020-04-27 NOTE — Patient Instructions (Signed)
We have sent in myrbetriq to try 1 pill daily to help the bladder. This does take about a month to work so try it for a month before deciding if it will help.

## 2020-05-15 ENCOUNTER — Telehealth: Payer: Self-pay | Admitting: Internal Medicine

## 2020-05-15 NOTE — Progress Notes (Signed)
  Chronic Care Management   Note  05/15/2020 Name: Jocelyn Mendez MRN: [id number] DOB: [birth year]  Jocelyn Mendez is a 87 y.o. year old female who is a primary care patient of Hoyt Koch, MD. I reached out to [name] by phone today in response to a referral sent by Jocelyn Mendez's PCP, Hoyt Koch, MD.   Jocelyn Mendez was given information about Chronic Care Management services today including:  1. CCM service includes personalized support from designated clinical staff supervised by her physician, including individualized plan of care and coordination with other care providers 2. 24/7 contact phone numbers for assistance for urgent and routine care needs. 3. Service will only be billed when office clinical staff spend 20 minutes or more in a month to coordinate care. 4. Only one practitioner may furnish and bill the service in a calendar month. 5. The patient may stop CCM services at any time (effective at the end of the month) by phone call to the office staff.   Patient wishes to consider information provided and/or speak with a member of the care team before deciding about enrollment in care management services.   Follow up plan:   Jocelyn Mendez [employer] Scheduler

## 2020-06-08 DIAGNOSIS — H401131 Primary open-angle glaucoma, bilateral, mild stage: Secondary | ICD-10-CM | POA: Diagnosis not present

## 2020-06-11 ENCOUNTER — Other Ambulatory Visit: Payer: Self-pay | Admitting: Endocrinology

## 2020-06-11 ENCOUNTER — Other Ambulatory Visit: Payer: Self-pay | Admitting: Internal Medicine

## 2020-06-29 ENCOUNTER — Other Ambulatory Visit: Payer: Self-pay

## 2020-06-29 ENCOUNTER — Ambulatory Visit (INDEPENDENT_AMBULATORY_CARE_PROVIDER_SITE_OTHER): Payer: Medicare Other | Admitting: Endocrinology

## 2020-06-29 VITALS — BP 150/60 | HR 92 | Ht 66.0 in | Wt 155.4 lb

## 2020-06-29 DIAGNOSIS — E1122 Type 2 diabetes mellitus with diabetic chronic kidney disease: Secondary | ICD-10-CM

## 2020-06-29 DIAGNOSIS — N1832 Chronic kidney disease, stage 3b: Secondary | ICD-10-CM | POA: Diagnosis not present

## 2020-06-29 LAB — POCT GLYCOSYLATED HEMOGLOBIN (HGB A1C): Hemoglobin A1C: 6.2 % — AB (ref 4.0–5.6)

## 2020-06-29 NOTE — Patient Instructions (Addendum)
Your blood pressure is high today.  Please see your primary care provider soon, to have it rechecked check your blood sugar twice a day.  vary the time of day when you check, between before the 3 meals, and at bedtime.  also check if you have symptoms of your blood sugar being too high or too low.  please keep a record of the readings and bring it to your next appointment here.  You can write it on any piece of paper.  please call us sooner if your blood sugar goes below 70, or if you have a lot of readings over 200.   A different type of diabetes blood test is requested for you today.  We'll let you know about the results.    On this type of insulin schedule, you should eat meals on a regular schedule.  If a meal is missed or significantly delayed, your blood sugar could go low.   Please come back for a follow-up appointment in 3 months.

## 2020-06-29 NOTE — Progress Notes (Signed)
Subjective:    Patient ID: Jocelyn Mendez, female    DOB: July 15, 1930, 87 y.o.   MRN: [id number]  HPI Pt returns for f/u of diabetes mellitus:  DM type: Insulin-requiring type 2.  Dx'ed: 1988.   Complications: PN and stage 4 CRI.   Therapy: insulin since 2012.  GDM: never.  DKA: never Severe hypoglycemia: never.   Pancreatitis: never.   SDOH: She takes her own insulin, and checks her own cbg.   Other: she declines multiple daily injections; she uses pen, due to visual problems; on levemir, she had am hypoglycemia and pm hyperglycemia, so she was changed to QAM NPH.   Interval history:  pt states she feels well in general.  no cbg record, but states it varies from from 82-264.  It is in general higher as the day goes on.  She says she never misses the insulin.  No recent steroids.   Past Medical History:  Diagnosis Date  . Acute bronchitis   . Anemia of other chronic disease   . Anxiety state, unspecified   . Chest pain, unspecified   . Diverticulosis of colon (without mention of hemorrhage)   . Esophageal reflux   . Gallstones   . Generalized osteoarthrosis, unspecified site   . Headache(784.0)   . Lumbago   . Neuropathy in diabetes ([place])    bilat LE's  . Osteoporosis   . Other and unspecified hyperlipidemia   . Other B-complex deficiencies   . Type II or unspecified type diabetes mellitus without mention of complication, not stated as uncontrolled   . Unspecified disorder resulting from impaired renal function   . Unspecified essential hypertension   . Unspecified hypothyroidism     Past Surgical History:  Procedure Laterality Date  . APPENDECTOMY    . THYROIDECTOMY      Social History   Socioeconomic History  . Marital status: Divorced    Spouse name: Not on file  . Number of children: 6  . Years of education: Not on file  . Highest education level: Not on file  Occupational History  . Occupation: retired  Tobacco Use  . Smoking status: Former Smoker     Packs/day: 0.50    Years: 6.00    Pack years: 3.00    Types: Cigarettes    Quit date: 02/10/1973    Years since quitting: 47.4  . Smokeless tobacco: Former Systems developer    Types: Snuff    Quit date: 04/07/2010  Vaping Use  . Vaping Use: Never used  Substance and Sexual Activity  . Alcohol use: No    Alcohol/week: 0.0 standard drinks  . Drug use: No  . Sexual activity: Never  Other Topics Concern  . Not on file  Social History Narrative   1 child passed away at 77 weeks old---?crib death   Dtr; [name]; Son lives with her [name]; [name]; roger;    Social Determinants of Health   Financial Resource Strain: Low Risk   . Difficulty of Paying Living Expenses: Not hard at all  Food Insecurity: No Food Insecurity  . Worried About Charity fundraiser in the Last Year: Never true  . Ran Out of Food in the Last Year: Never true  Transportation Needs: No Transportation Needs  . Lack of Transportation (Medical): No  . Lack of Transportation (Non-Medical): No  Physical Activity: Insufficiently Active  . Days of Exercise per Week: 7 days  . Minutes of Exercise per Session: 20 min  Stress: No Stress Concern Present  . Feeling of Stress : Not at all  Social Connections: Not on file  Intimate Partner Violence: Not At Risk  . Fear of Current or Ex-Partner: No  . Emotionally Abused: No  . Physically Abused: No  . Sexually Abused: No    Current Outpatient Medications on File Prior to Visit  Medication Sig Dispense Refill  . albuterol (PROVENTIL HFA;VENTOLIN HFA) 108 (90 BASE) MCG/ACT inhaler Inhale 2 puffs into the lungs every 6 (six) hours as needed for wheezing or shortness of breath. 1 Inhaler 6  . amLODipine (NORVASC) 5 MG tablet TAKE 1 TABLET BY MOUTH EVERY DAY 90 tablet 1  . aspirin 81 MG tablet Take 81 mg by mouth daily.    . Blood Glucose Monitoring Suppl (ACCU-CHEK GUIDE ME) w/Device KIT 1 each by Does not apply route 4 (four) times daily. E11.9 1 kit 0  . busPIRone (BUSPAR)  5 MG tablet TAKE 1 TABLET BY MOUTH TWICE DAILY 60 tablet 3  . Cholecalciferol (VITAMIN D3) 2000 UNITS TABS Take 2,000 Units by mouth 2 (two) times daily.     . cyanocobalamin (,VITAMIN B-12,) 1000 MCG/ML injection INJECT ONE ML INTRAMUSCULARLY EVERY MONTH 1 mL 1  . fenofibrate 160 MG tablet TAKE 1 TABLET BY MOUTH DAILY 30 tablet 1  . ferrous sulfate 325 (65 FE) MG EC tablet Take 325 mg by mouth 2 (two) times daily.    Marland Kitchen gabapentin (NEURONTIN) 100 MG capsule TAKE ONE CAPSULE BY MOUTH THREE TIMES DAILY 90 capsule 1  . Insulin NPH, Human,, Isophane, (HUMULIN N KWIKPEN) 100 UNIT/ML Kiwkpen Inject 54 Units into the skin every morning. 60 mL 3  . Lancets (ONETOUCH DELICA PLUS IWOEHO12Y) MISC USE FOUR TIMES DAILY AS DIRECTED 300 each 1  . latanoprost (XALATAN) 0.005 % ophthalmic solution Place 1 drop into both eyes at bedtime.  11  . levothyroxine (SYNTHROID) 137 MCG tablet TAKE 1 TABLET BY MOUTH DAILY BEFORE breakfast 90 tablet 3  . lidocaine (LIDODERM) 5 % APPLY ONE PATCH TO SKIN EVERY DAY. REMOVE AND DISCARD PATCH WITHIN 12 HOURS OR AS DIRECTED BY MD - (Patient taking differently: Place 1 patch onto the skin daily.) 30 patch 0  . mirabegron ER (MYRBETRIQ) 50 MG TB24 tablet Take 1 tablet (50 mg total) by mouth daily. 90 tablet 1  . NEEDLE, DISP, 25 G (BD DISP NEEDLES) 25G X 5/8" MISC Use as directed to administer  the b12 injection monthly 50 each 2  . neomycin-polymyxin-hydrocortisone (CORTISPORIN) OTIC solution Place 3 drops into both ears 3 (three) times daily. 10 mL 0  . ONETOUCH VERIO test strip USE TO TEST FOUR TIMES DAILY 100 strip 5  . polyethylene glycol (MIRALAX / GLYCOLAX) packet Take 17 g by mouth daily. 14 each 0  . rosuvastatin (CRESTOR) 20 MG tablet TAKE 1 TABLET BY MOUTH DAILY 90 tablet 3  . saccharomyces boulardii (FLORASTOR) 250 MG capsule Take 1 capsule (250 mg total) by mouth 2 (two) times daily. 60 capsule 0   No current facility-administered medications on file prior to visit.     Allergies  Allergen Reactions  . Codeine Other (See Comments)    REACTION: change in mental status  . Diphenhydramine Hcl Itching  . Quinine Other (See Comments)    REACTION: abnormal sensations in face  . Sulfonamide Derivatives Rash    Family History  Problem Relation Age of Onset  . Diabetes Mother   . Cancer Father  unsure ? stomach  . Heart attack Brother        x 3  . Cancer Sister        unsure type  . Lung cancer Brother        smoker  . Heart attack Sister     BP (!) 150/60 (BP Location: Right Arm, Patient Position: Sitting, Cuff Size: Normal)   Pulse 92   Ht '5\' 6"'  (1.676 m)   Wt 155 lb 6.4 oz (70.5 kg)   SpO2 96%   BMI 25.08 kg/m    Review of Systems     Objective:   Physical Exam VITAL SIGNS:  See vs page GENERAL: no distress Pulses: dorsalis pedis intact bilat.   MSK: no deformity of the feet CV: no leg edema, but there are bilat vv's. Skin:  no ulcer on the feet.  normal color and temp on the feet.   Neuro: sensation is intact to touch on the feet.   Ext: there is bilateral onychomycosis of the toenails.     Lab Results  Component Value Date   CREATININE 1.79 (H) 02/22/2020   BUN 35 (H) 02/22/2020   NA 138 02/22/2020   K 4.2 02/22/2020   CL 109 02/22/2020   CO2 23 02/22/2020   Lab Results  Component Value Date   HGBA1C 6.2 (A) 06/29/2020       Assessment & Plan:  Insulin-requiring type 2 DM: overcontrolled.  Renal failure: check fructosamine.   Patient Instructions  Your blood pressure is high today.  Please see your primary care provider soon, to have it rechecked check your blood sugar twice a day.  vary the time of day when you check, between before the 3 meals, and at bedtime.  also check if you have symptoms of your blood sugar being too high or too low.  please keep a record of the readings and bring it to your next appointment here.  You can write it on any piece of paper.  please call us sooner if your blood sugar  goes below 70, or if you have a lot of readings over 200.   A different type of diabetes blood test is requested for you today.  We'll let you know about the results.    On this type of insulin schedule, you should eat meals on a regular schedule.  If a meal is missed or significantly delayed, your blood sugar could go low.   Please come back for a follow-up appointment in 3 months.

## 2020-07-03 LAB — FRUCTOSAMINE: Fructosamine: 312 umol/L — ABNORMAL HIGH (ref 205–285)

## 2020-07-22 ENCOUNTER — Other Ambulatory Visit: Payer: Self-pay | Admitting: Internal Medicine

## 2020-07-30 ENCOUNTER — Other Ambulatory Visit: Payer: Self-pay

## 2020-07-30 ENCOUNTER — Encounter: Payer: Self-pay | Admitting: Internal Medicine

## 2020-07-30 ENCOUNTER — Ambulatory Visit (INDEPENDENT_AMBULATORY_CARE_PROVIDER_SITE_OTHER): Payer: Medicare Other | Admitting: Internal Medicine

## 2020-07-30 VITALS — BP 136/60 | HR 83 | Temp 98.0°F | Ht 66.0 in | Wt 149.0 lb

## 2020-07-30 DIAGNOSIS — N3941 Urge incontinence: Secondary | ICD-10-CM | POA: Diagnosis not present

## 2020-07-30 DIAGNOSIS — N184 Chronic kidney disease, stage 4 (severe): Secondary | ICD-10-CM | POA: Diagnosis not present

## 2020-07-30 LAB — RENAL FUNCTION PANEL
Albumin: 4 g/dL (ref 3.5–5.2)
BUN: 43 mg/dL — ABNORMAL HIGH (ref 6–23)
CO2: 20 mEq/L (ref 19–32)
Calcium: 9.7 mg/dL (ref 8.4–10.5)
Chloride: 108 mEq/L (ref 96–112)
Creatinine, Ser: 1.88 mg/dL — ABNORMAL HIGH (ref 0.40–1.20)
GFR: 23.73 mL/min — ABNORMAL LOW (ref >60.00)
Glucose, Bld: 147 mg/dL — ABNORMAL HIGH (ref 70–99)
Phosphorus: 3.3 mg/dL (ref 2.3–4.6)
Potassium: 4.5 mEq/L (ref 3.5–5.1)
Sodium: 138 mEq/L (ref 135–145)

## 2020-07-30 LAB — CBC
HCT: 32.9 % — ABNORMAL LOW (ref 36.0–46.0)
Hemoglobin: 11 g/dL — ABNORMAL LOW (ref 12.0–15.0)
MCHC: 33.6 g/dL (ref 30.0–36.0)
MCV: 94.5 fl (ref 78.0–100.0)
Platelets: 235 10*3/uL (ref 150.0–400.0)
RBC: 3.48 Mil/uL — ABNORMAL LOW (ref 3.87–5.11)
RDW: 14.1 % (ref 11.5–15.5)
WBC: 5.5 10*3/uL (ref 4.0–10.5)

## 2020-07-30 NOTE — Progress Notes (Signed)
   Subjective:   Patient ID: Jocelyn Mendez, female    DOB: November 16, 1930, 87 y.o.   MRN: [id number]  HPI The patient is an 87 YO female coming in for follow up myrbetriq. She started this back in March for urinary incontinence. She does not feel it has made a difference. She has already failed oxybutynin and vesicare as well. She is having large volume loss and does not always know when this will happen. She is not sure if she wants to move forward with urology to assess if there is a surgical option to help. She also has questions about her renal function and making sure these bladder medicines and problems do not indicate change in renal function.   Review of Systems  Constitutional: Negative.   HENT: Negative.    Eyes: Negative.   Respiratory:  Negative for cough, chest tightness and shortness of breath.   Cardiovascular:  Negative for chest pain, palpitations and leg swelling.  Gastrointestinal:  Negative for abdominal distention, abdominal pain, constipation, diarrhea, nausea and vomiting.  Musculoskeletal:  Positive for arthralgias and gait problem.  Skin: Negative.   Psychiatric/Behavioral: Negative.     Objective:  Physical Exam Constitutional:      Appearance: She is well-developed.  HENT:     Head: Normocephalic and atraumatic.  Cardiovascular:     Rate and Rhythm: Normal rate and regular rhythm.  Pulmonary:     Effort: Pulmonary effort is normal. No respiratory distress.     Breath sounds: Normal breath sounds. No wheezing or rales.  Abdominal:     General: Bowel sounds are normal. There is no distension.     Palpations: Abdomen is soft.     Tenderness: There is no abdominal tenderness. There is no rebound.  Musculoskeletal:     Cervical back: Normal range of motion.  Skin:    General: Skin is warm and dry.  Neurological:     Mental Status: She is alert and oriented to person, place, and time.     Coordination: Coordination abnormal.     Comments: Wheelchair for long  distances    Vitals:   07/30/20 1329  BP: 136/60  Pulse: 83  Temp: 98 F (36.7 C)  TempSrc: Oral  SpO2: 99%  Weight: 149 lb (67.6 kg)  Height: '5\' 6"'$  (1.676 m)    This visit occurred during the SARS-CoV-2 public health emergency.  Safety protocols were in place, including screening questions prior to the visit, additional usage of staff PPE, and extensive cleaning of exam room while observing appropriate contact time as indicated for disinfecting solutions.   Assessment & Plan:

## 2020-07-30 NOTE — Patient Instructions (Addendum)
It is okay to stop taking the oxybutynin and stop taking the mirabegron (myrbetriq).   Let us know if you want to see the urologist as they might be able to help the urinary   We are checking the kidney function today.  Use tylenol for pain if needed. Do not take aleve (naproxen) or ibuprofen (motrin).

## 2020-07-31 NOTE — Assessment & Plan Note (Signed)
She has some leftover oxybutynin and wants to try both oxybutynin and myrbetriq to see if this helps at all. She will let us know if she wants to pursue urology referral as this is our next option as she has tried and failed oxybutynin, vesicare and myrbetriq getting no relief from any of these.

## 2020-07-31 NOTE — Assessment & Plan Note (Addendum)
We did talk about her renal function being impaired but relatively stable over the last several years. High blood pressure and diabetes have impacted her kidneys over the years and she has good control currently of her diabetes and hypertension which is the best way to help keep her renal function. She knows to never take NSAIDS and we did review that today. She will only take tylenol for pain. Checking CMP and CBC for stability today.

## 2020-08-03 ENCOUNTER — Ambulatory Visit: Payer: Medicare Other | Admitting: Internal Medicine

## 2020-08-20 ENCOUNTER — Other Ambulatory Visit: Payer: Self-pay | Admitting: Internal Medicine

## 2020-09-14 ENCOUNTER — Other Ambulatory Visit: Payer: Self-pay | Admitting: Endocrinology

## 2020-09-14 DIAGNOSIS — N1832 Chronic kidney disease, stage 3b: Secondary | ICD-10-CM

## 2020-09-14 DIAGNOSIS — E1122 Type 2 diabetes mellitus with diabetic chronic kidney disease: Secondary | ICD-10-CM

## 2020-10-04 IMAGING — CT CT HEAD W/O CM
4 series · 17 of 47 positions shown, 19 images · non-contrast
Comparison: 08/24/2016

CLINICAL DATA: Ct head wo, Pt arrives POV for eval of weakness and
fatigue. DAughter reports that she is normally ambulatory, cooking,
independent. States that this AM she was unable to get her off the
toilet

EXAM:
CT HEAD WITHOUT CONTRAST
TECHNIQUE: Contiguous axial images were obtained from the base of the skull
through the vertex without intravenous contrast.

[Series 2: head without · axial · non-contrast · 0.44mm/px · z∈[-54,+76]mm · 7 of 36 slices shown, 9 images]
[im 5/36  brain]
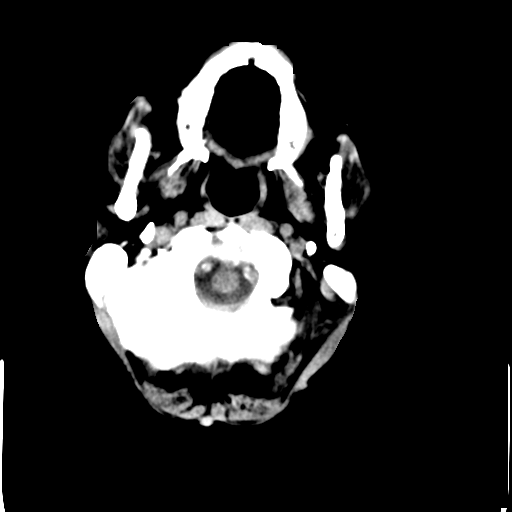
[im 5/36  bone]
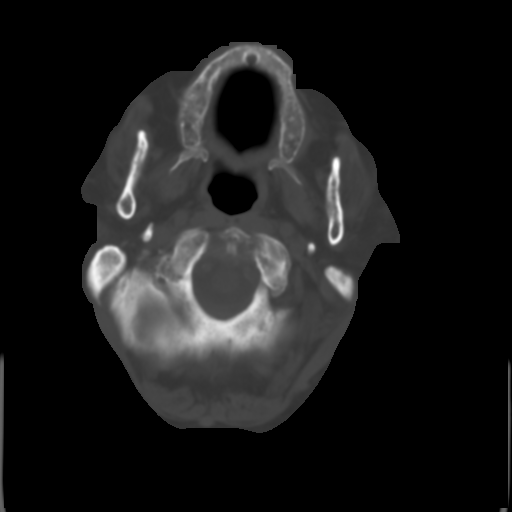
[im 9/36  brain]
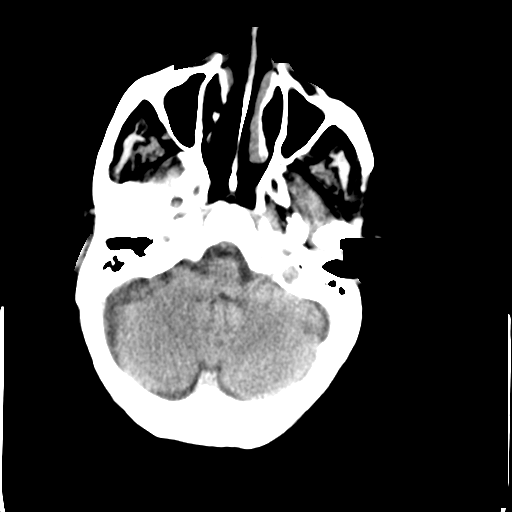
[im 14/36  brain]
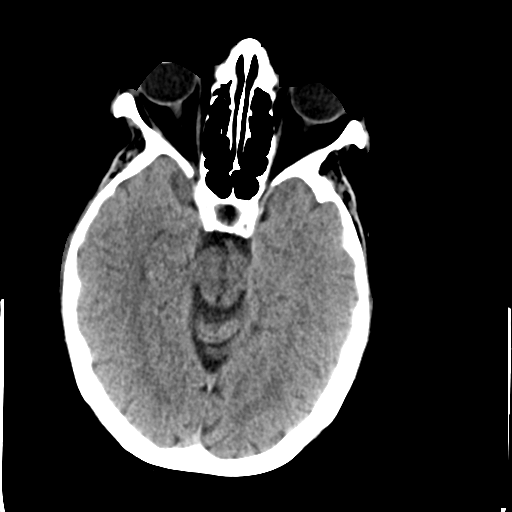
[im 18/36  brain]
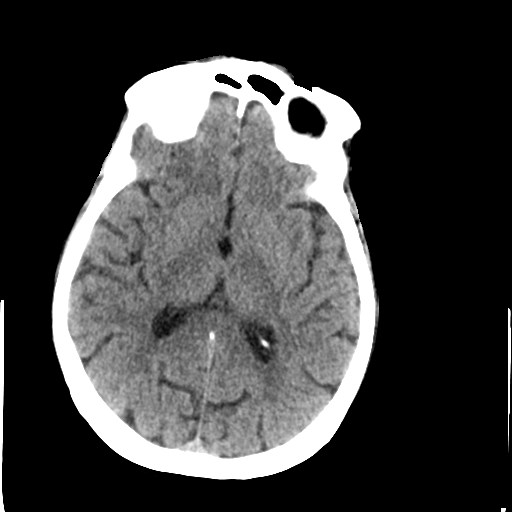
[im 22/36  brain]
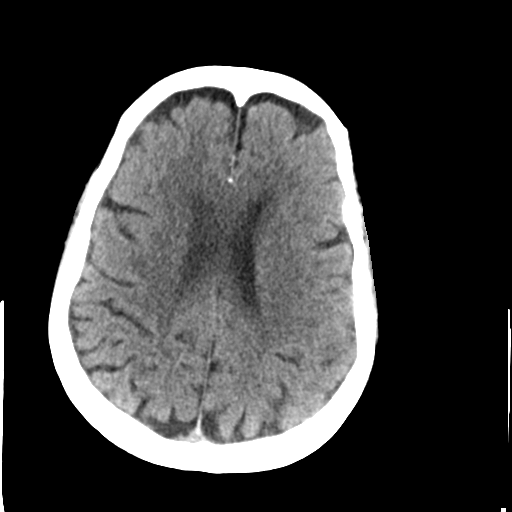
[im 22/36  bone]
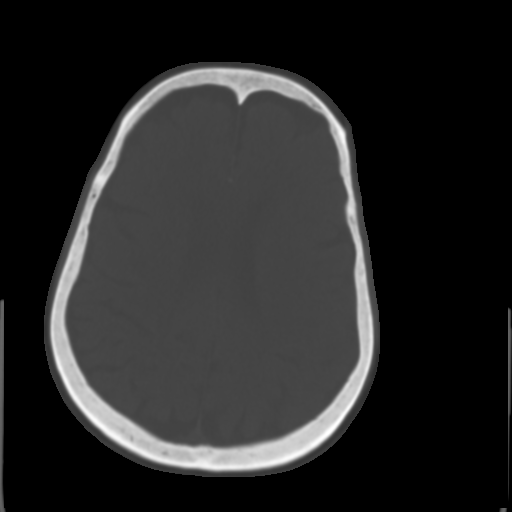
[im 27/36  brain]
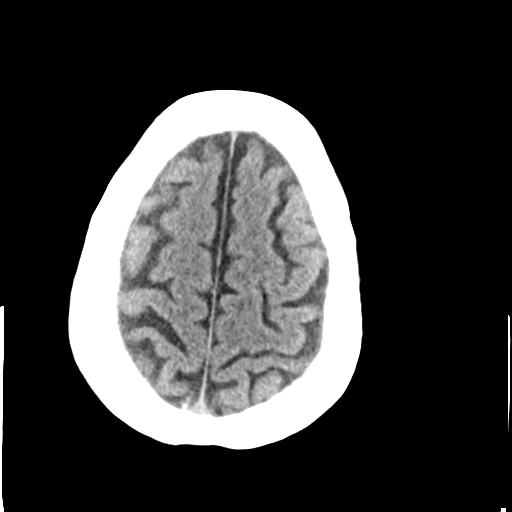
[im 31/36  brain]
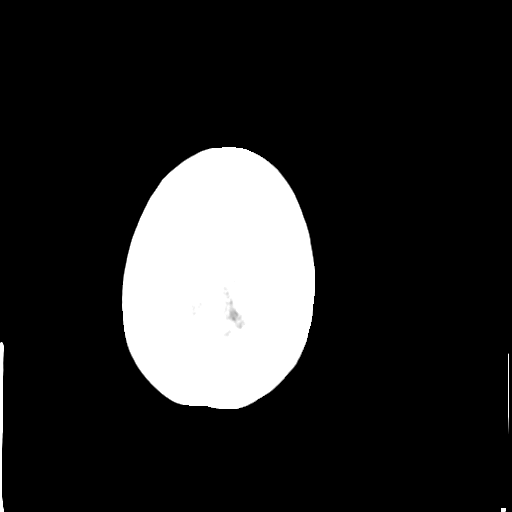

[Series 3: head bone · axial · 0.44mm/px · z∈[-58,+4]mm · 4 of 88 slices shown]
[im 9/88  bone]
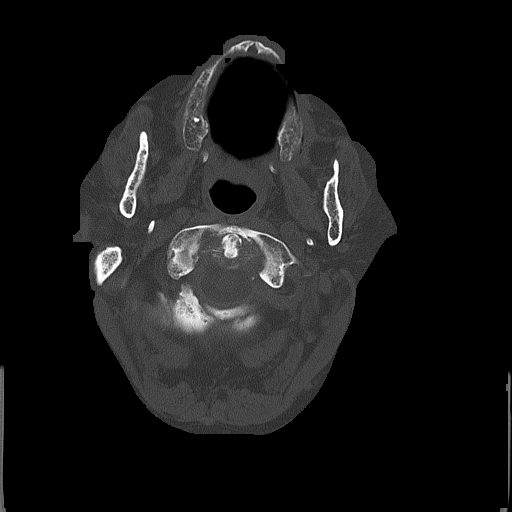
[im 18/88  bone]
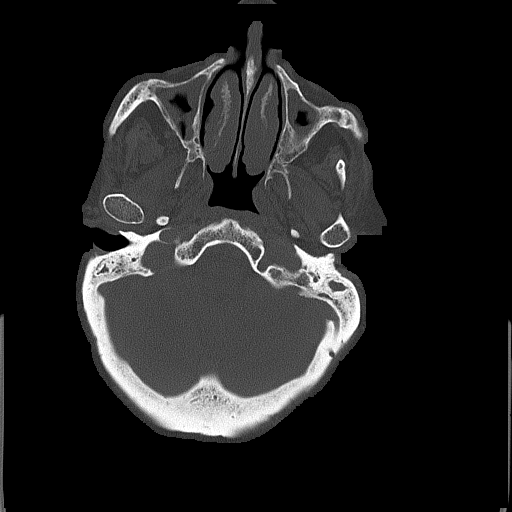
[im 27/88  bone]
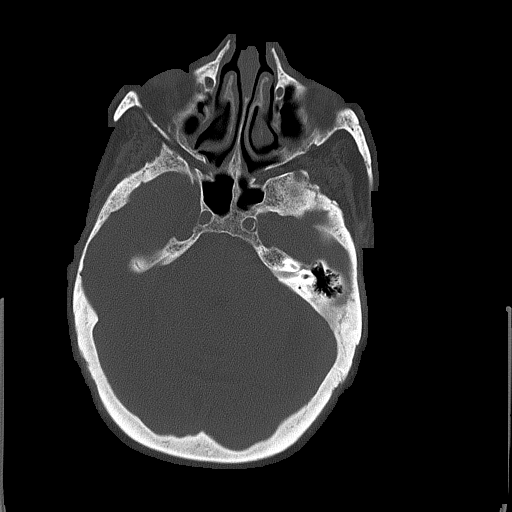
[im 40/88  bone]
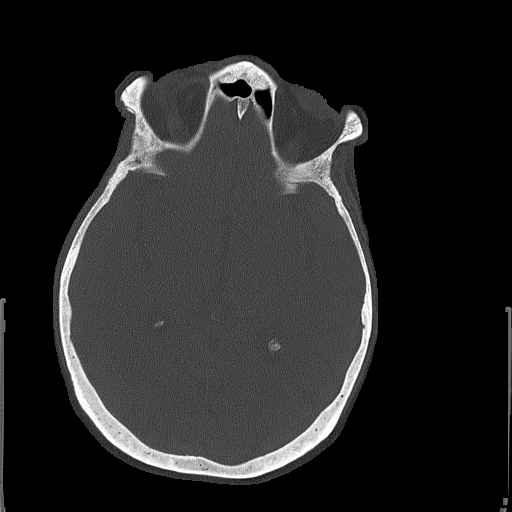

[Series 4: head without cor · coronal · non-contrast · 0.34mm/px · 3 of 68 slices shown]
[im 23/68  brain]
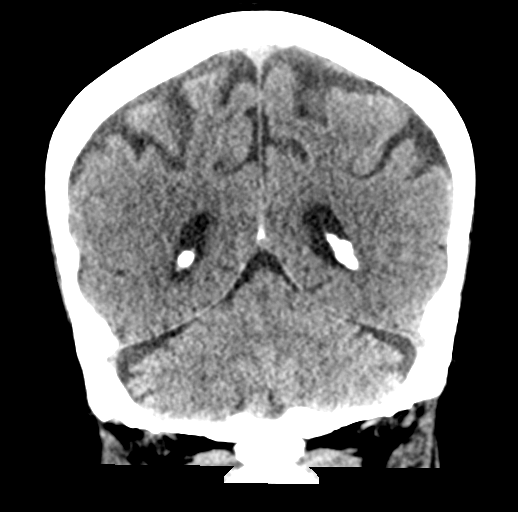
[im 30/68  brain]
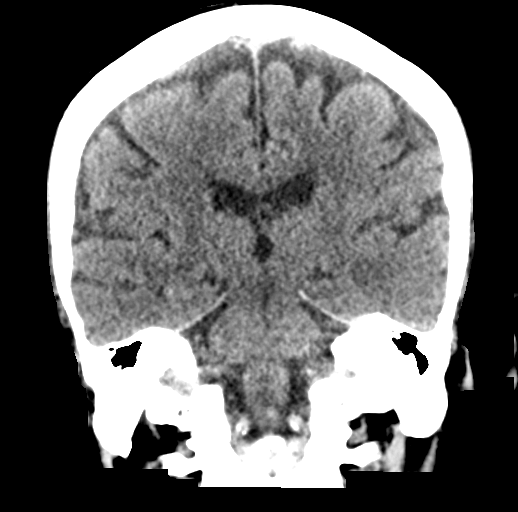
[im 38/68  brain]
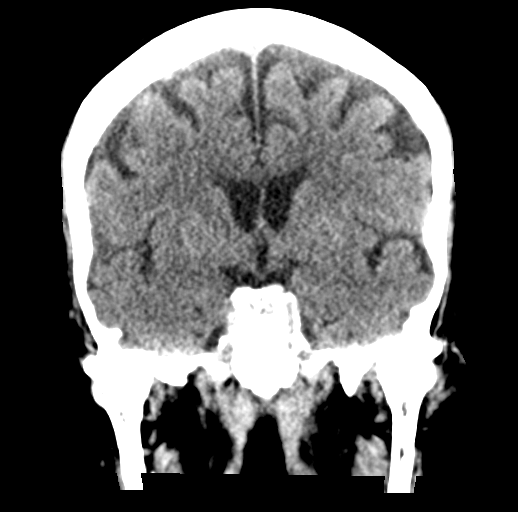

[Series 5: head without sag · sagittal · non-contrast · 0.33mm/px · 3 of 58 slices shown]
[im 20/58  brain]
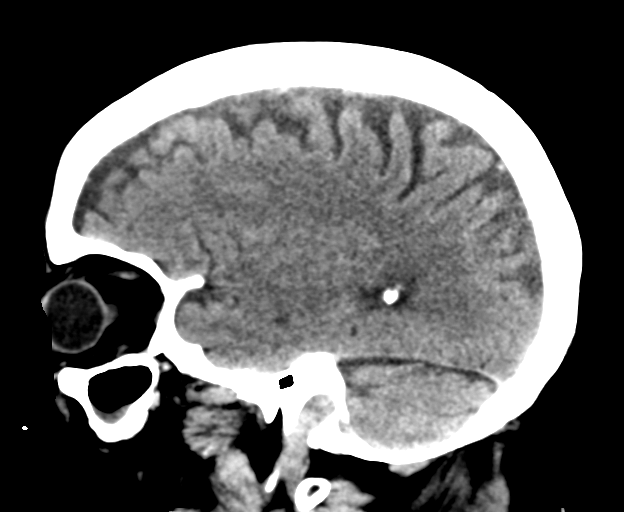
[im 29/58  brain]
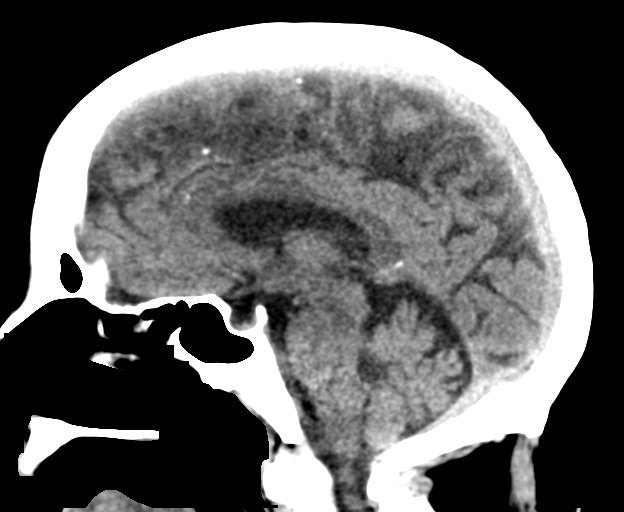
[im 39/58  brain]
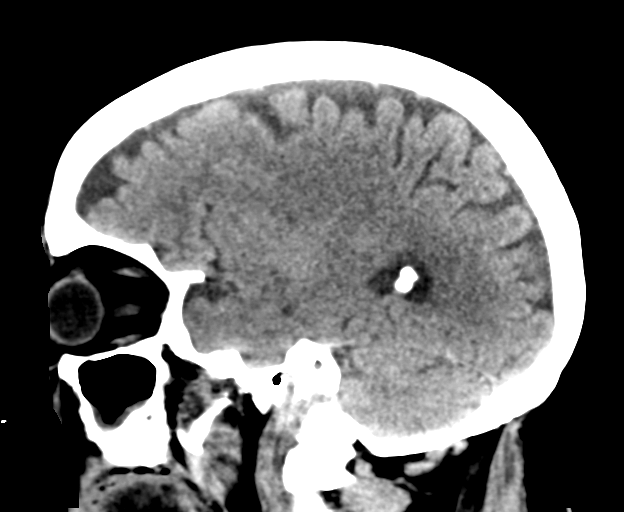

[17 of 47 positions shown; findings below may reference images not displayed]

FINDINGS: Brain: No evidence of acute infarction, hemorrhage, hydrocephalus,
extra-axial collection or mass lesion/mass effect.

Mild periventricular white matter hypoattenuation is noted
consistent with chronic microvascular ischemic change.

Vascular: No hyperdense vessel or unexpected calcification.

Skull: Normal. Negative for fracture or focal lesion.

Sinuses/Orbits: Globes and orbits are unremarkable. Mild mucosal
thickening lines the maxillary sinuses.

Other: None.
IMPRESSION: 1. No acute intracranial abnormalities.
2. Mild chronic microvascular ischemic change.
3. Mild maxillary sinus mucosal thickening.

## 2020-10-04 IMAGING — CR DG CHEST 2V
2 series · 2 of 2 positions shown · non-contrast
Comparison: 03/05/2017

CLINICAL DATA: Fatigue weakness

EXAM:
CHEST - 2 VIEW

[chest lat]
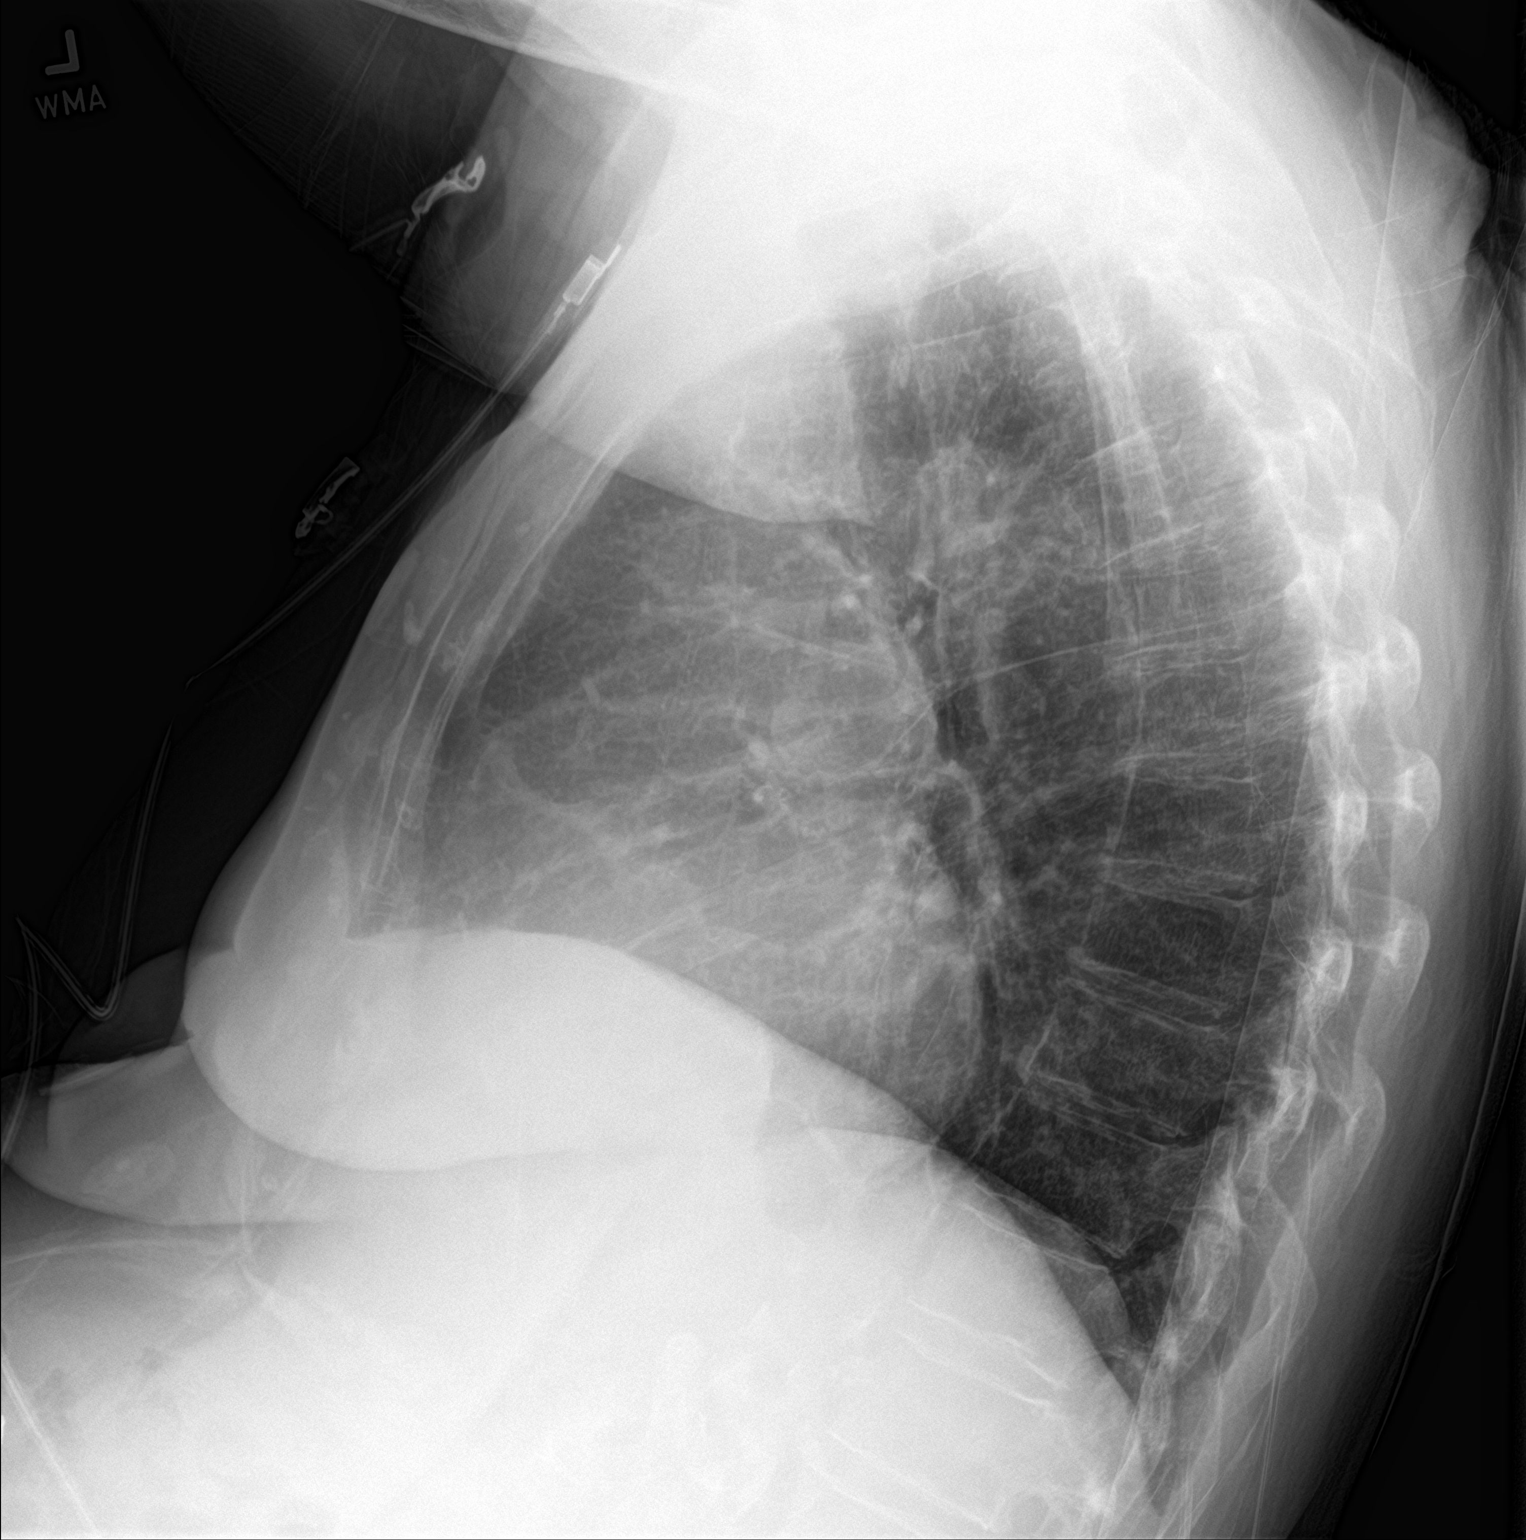

[chest ap]
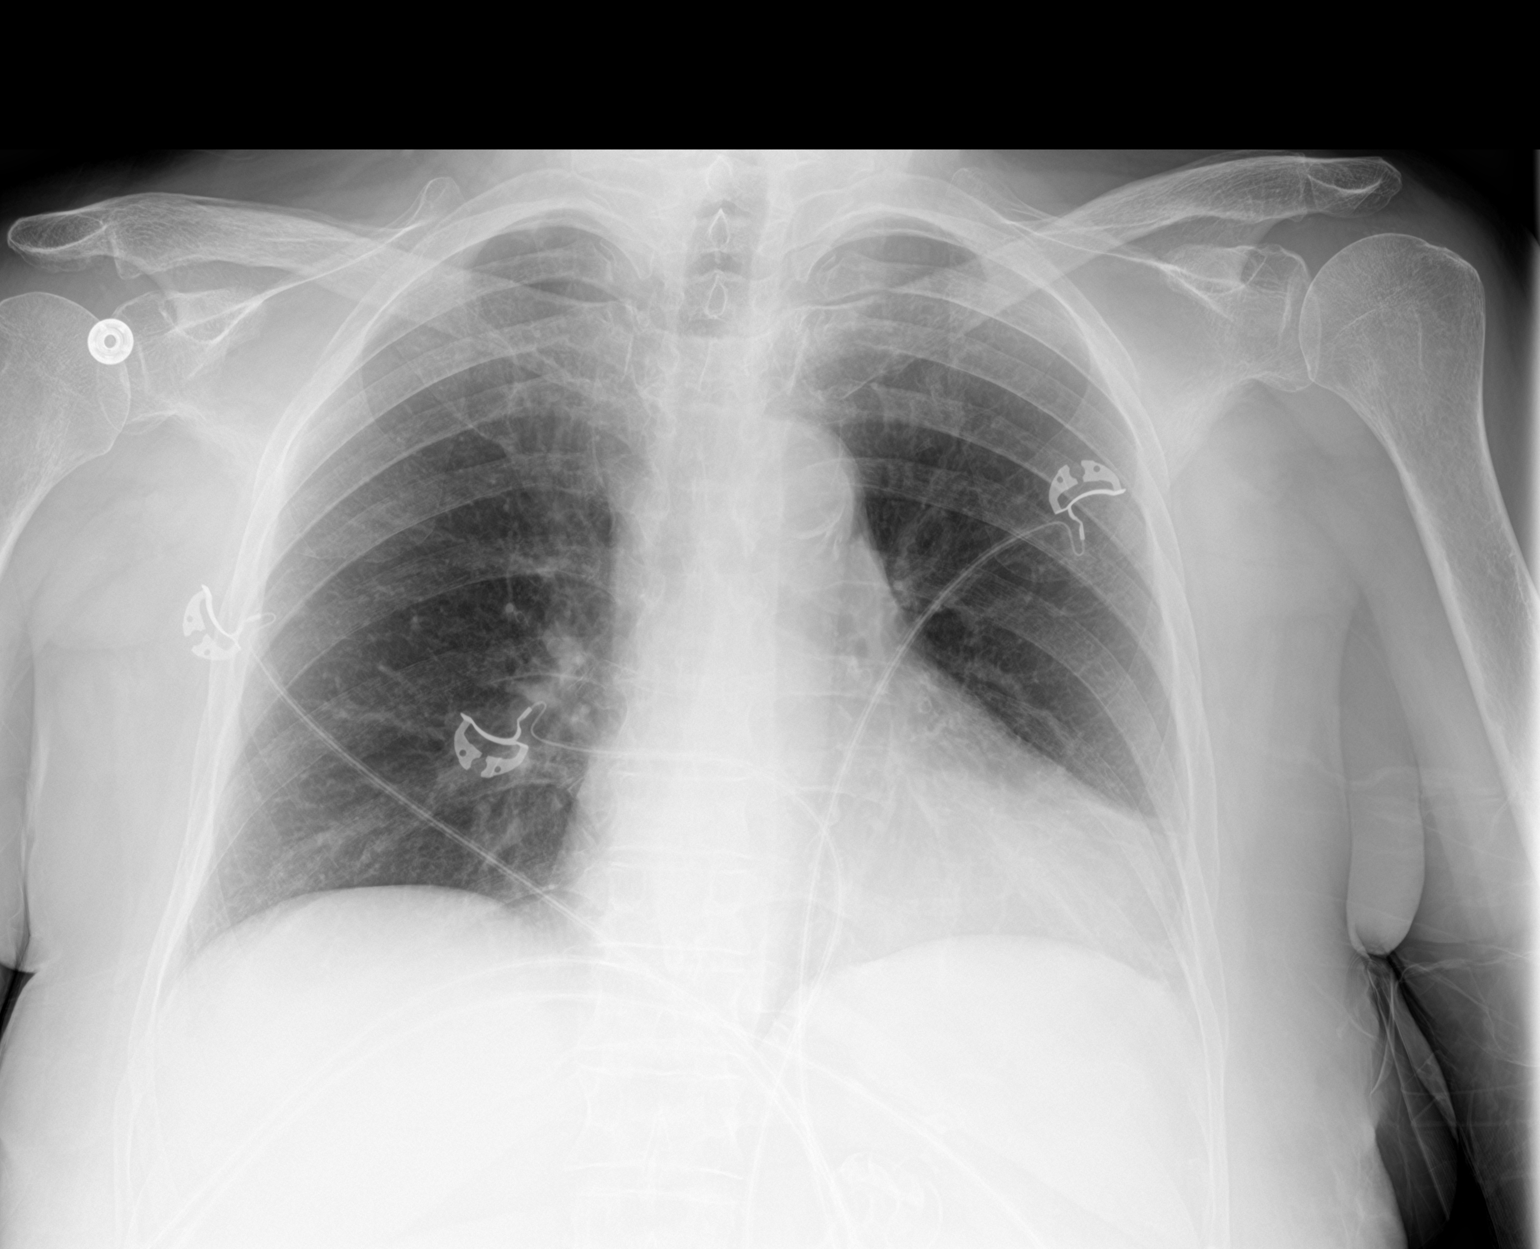

[2 of 2 positions shown; findings below may reference images not displayed]

FINDINGS: The heart size and mediastinal contours are stable. Calcific aortic
knob. Both lungs are clear. The visualized skeletal structures are
unremarkable.
IMPRESSION: No active cardiopulmonary disease.

## 2020-10-05 ENCOUNTER — Ambulatory Visit (INDEPENDENT_AMBULATORY_CARE_PROVIDER_SITE_OTHER): Payer: Medicare Other | Admitting: Endocrinology

## 2020-10-05 ENCOUNTER — Other Ambulatory Visit: Payer: Self-pay

## 2020-10-05 VITALS — BP 120/60 | HR 89 | Ht 66.0 in | Wt 157.4 lb

## 2020-10-05 DIAGNOSIS — E785 Hyperlipidemia, unspecified: Secondary | ICD-10-CM | POA: Diagnosis not present

## 2020-10-05 DIAGNOSIS — E1122 Type 2 diabetes mellitus with diabetic chronic kidney disease: Secondary | ICD-10-CM | POA: Diagnosis not present

## 2020-10-05 DIAGNOSIS — E1169 Type 2 diabetes mellitus with other specified complication: Secondary | ICD-10-CM | POA: Diagnosis not present

## 2020-10-05 DIAGNOSIS — N1832 Chronic kidney disease, stage 3b: Secondary | ICD-10-CM

## 2020-10-05 LAB — POCT GLYCOSYLATED HEMOGLOBIN (HGB A1C): Hemoglobin A1C: 6.7 % — AB (ref 4.0–5.6)

## 2020-10-05 NOTE — Patient Instructions (Addendum)
check your blood sugar twice a day.  vary the time of day when you check, between before the 3 meals, and at bedtime.  also check if you have symptoms of your blood sugar being too high or too low.  please keep a record of the readings and bring it to your next appointment here.  You can write it on any piece of paper.  please call us sooner if your blood sugar goes below 70, or if you have a lot of readings over 200.   On this type of insulin schedule, you should eat meals on a regular schedule.  If a meal is missed or significantly delayed, your blood sugar could go low.   Please come back for a follow-up appointment in 2 months.  Please bring your meter and a strip.

## 2020-10-05 NOTE — Progress Notes (Signed)
Subjective:    Patient ID: Jocelyn Mendez, female    DOB: [birth year], 87 y.o.   MRN: [id number]  HPI Pt returns for f/u of diabetes mellitus:  DM type: Insulin-requiring type 2.  Dx'ed: 1988.   Complications: PN and stage 4 CRI.   Therapy: insulin since 2012.  GDM: never.  DKA: never Severe hypoglycemia: never.   Pancreatitis: never.   SDOH: She takes her own insulin, and checks her own cbg.   Other: she declines multiple daily injections; she uses pen, due to visual problems; on levemir, she had am hypoglycemia and pm hyperglycemia, so she was changed to QAM NPH; fructosamine converts to A1c much higher than A1c itself.   Interval history:  pt states she feels well in general.  no cbg record, but states it varies from from 70-488, but she is uncertain as to the accuracy of her meter.  It is in general higher as the day goes on.  She says she never misses the insulin.  Pt says she never misses the insulin.    Past Medical History:  Diagnosis Date   Acute bronchitis    Anemia of other chronic disease    Anxiety state, unspecified    Chest pain, unspecified    Diverticulosis of colon (without mention of hemorrhage)    Esophageal reflux    Gallstones    Generalized osteoarthrosis, unspecified site    Headache(784.0)    Lumbago    Neuropathy in diabetes ([place])    bilat LE's   Osteoporosis    Other and unspecified hyperlipidemia    Other B-complex deficiencies    Type II or unspecified type diabetes mellitus without mention of complication, not stated as uncontrolled    Unspecified disorder resulting from impaired renal function    Unspecified essential hypertension    Unspecified hypothyroidism     Past Surgical History:  Procedure Laterality Date   APPENDECTOMY     THYROIDECTOMY      Social History   Socioeconomic History   Marital status: Divorced    Spouse name: Not on file   Number of children: 6   Years of education: Not on file   Highest education level: Not  on file  Occupational History   Occupation: retired  Tobacco Use   Smoking status: Former    Packs/day: 0.50    Years: 6.00    Pack years: 3.00    Types: Cigarettes    Quit date: 02/10/1973    Years since quitting: 47.6   Smokeless tobacco: Former    Types: Snuff    Quit date: 04/07/2010  Vaping Use   Vaping Use: Never used  Substance and Sexual Activity   Alcohol use: No    Alcohol/week: 0.0 standard drinks   Drug use: No   Sexual activity: Never  Other Topics Concern   Not on file  Social History Narrative   1 child passed away at 40 weeks old---?crib death   Dtr; [name]; Son lives with her [name]; [name]; roger;    Social Determinants of Health   Financial Resource Strain: Low Risk    Difficulty of Paying Living Expenses: Not hard at all  Food Insecurity: No Food Insecurity   Worried About Charity fundraiser in the Last Year: Never true   Arboriculturist in the Last Year: Never true  Transportation Needs: No Transportation Needs   Lack of Transportation (Medical): No   Lack of Transportation (Non-Medical): No  Physical Activity: Insufficiently Active   Days of Exercise per Week: 7 days   Minutes of Exercise per Session: 20 min  Stress: No Stress Concern Present   Feeling of Stress : Not at all  Social Connections: Not on file  Intimate Partner Violence: Not At Risk   Fear of Current or Ex-Partner: No   Emotionally Abused: No   Physically Abused: No   Sexually Abused: No    Current Outpatient Medications on File Prior to Visit  Medication Sig Dispense Refill   albuterol (PROVENTIL HFA;VENTOLIN HFA) 108 (90 BASE) MCG/ACT inhaler Inhale 2 puffs into the lungs every 6 (six) hours as needed for wheezing or shortness of breath. 1 Inhaler 6   amLODipine (NORVASC) 5 MG tablet TAKE 1 TABLET BY MOUTH EVERY DAY 90 tablet 1   aspirin 81 MG tablet Take 81 mg by mouth daily.     Blood Glucose Monitoring Suppl (ACCU-CHEK GUIDE ME) w/Device KIT 1 each by Does not  apply route 4 (four) times daily. E11.9 1 kit 0   busPIRone (BUSPAR) 5 MG tablet TAKE 1 TABLET BY MOUTH TWICE DAILY 60 tablet 3   Cholecalciferol (VITAMIN D3) 2000 UNITS TABS Take 2,000 Units by mouth 2 (two) times daily.      cyanocobalamin (,VITAMIN B-12,) 1000 MCG/ML injection INJECT ONE ML INTRAMUSCULARLY EVERY MONTH 1 mL 1   fenofibrate 160 MG tablet TAKE 1 TABLET BY MOUTH DAILY 30 tablet 1   ferrous sulfate 325 (65 FE) MG EC tablet Take 325 mg by mouth 2 (two) times daily.     gabapentin (NEURONTIN) 100 MG capsule TAKE ONE CAPSULE BY MOUTH THREE TIMES DAILY 90 capsule 1   Insulin NPH, Human,, Isophane, (HUMULIN N KWIKPEN) 100 UNIT/ML Kiwkpen Inject 54 Units into the skin every morning. 60 mL 3   Lancet Devices (ADJUSTABLE LANCING DEVICE) MISC Apply 1 each topically daily.     Lancets (ONETOUCH DELICA PLUS [id number]) MISC USE FOUR TIMES DAILY AS DIRECTED 300 each 1   latanoprost (XALATAN) 0.005 % ophthalmic solution Place 1 drop into both eyes at bedtime.  11   levothyroxine (SYNTHROID) 137 MCG tablet TAKE 1 TABLET BY MOUTH DAILY BEFORE breakfast 90 tablet 3   lidocaine (LIDODERM) 5 % APPLY ONE PATCH TO SKIN EVERY DAY. REMOVE AND DISCARD PATCH WITHIN 12 HOURS OR AS DIRECTED BY MD - (Patient taking differently: Place 1 patch onto the skin daily.) 30 patch 0   mirabegron ER (MYRBETRIQ) 50 MG TB24 tablet Take 1 tablet (50 mg total) by mouth daily. 90 tablet 1   NEEDLE, DISP, 25 G (BD DISP NEEDLES) 25G X 5/8" MISC Use as directed to administer  the b12 injection monthly 50 each 2   neomycin-polymyxin-hydrocortisone (CORTISPORIN) OTIC solution Place 3 drops into both ears 3 (three) times daily. 10 mL 0   ONETOUCH VERIO test strip USE TO TEST FOUR TIMES DAILY 100 strip 5   polyethylene glycol (MIRALAX / GLYCOLAX) packet Take 17 g by mouth daily. 14 each 0   rosuvastatin (CRESTOR) 20 MG tablet TAKE 1 TABLET BY MOUTH DAILY 90 tablet 3   saccharomyces boulardii (FLORASTOR) 250 MG capsule Take 1  capsule (250 mg total) by mouth 2 (two) times daily. 60 capsule 0   No current facility-administered medications on file prior to visit.    Allergies  Allergen Reactions   Codeine Other (See Comments)    REACTION: change in mental status   Diphenhydramine Hcl Itching   Quinine Other (See Comments)  REACTION: abnormal sensations in face   Sulfonamide Derivatives Rash    Family History  Problem Relation Age of Onset   Diabetes Mother    Cancer Father        unsure ? stomach   Heart attack Brother        x 3   Cancer Sister        unsure type   Lung cancer Brother        smoker   Heart attack Sister     BP 120/60 (BP Location: Right Arm, Patient Position: Sitting, Cuff Size: Normal)   Pulse 89   Ht '5\' 6"'  (1.676 m)   Wt 157 lb 6.4 oz (71.4 kg)   SpO2 96%   BMI 25.41 kg/m    Review of Systems     Objective:   Physical Exam Pulses: dorsalis pedis intact bilat.   MSK: no deformity of the feet.   CV: no leg edema, but there are bilat vv's. Skin:  no ulcer on the feet.  normal color and temp on the feet.   Neuro: sensation is intact to touch on the feet.   Ext: there is bilateral onychomycosis of the toenails.    Lab Results  Component Value Date   HGBA1C 6.7 (A) 10/05/2020      Assessment & Plan:  Insulin-requiring type 2 DM: Uncontrolled.  Next step is to verify accuracy of meter, and to place continuous glucose monitor, but the office has run out.   Hypoglycemia, due to insulin. CRI: A1c underestimates avg glucose.    Patient Instructions  check your blood sugar twice a day.  vary the time of day when you check, between before the 3 meals, and at bedtime.  also check if you have symptoms of your blood sugar being too high or too low.  please keep a record of the readings and bring it to your next appointment here.  You can write it on any piece of paper.  please call us sooner if your blood sugar goes below 70, or if you have a lot of readings over 200.   On  this type of insulin schedule, you should eat meals on a regular schedule.  If a meal is missed or significantly delayed, your blood sugar could go low.   Please come back for a follow-up appointment in 2 months.  Please bring your meter and a strip.

## 2020-10-13 ENCOUNTER — Other Ambulatory Visit: Payer: Self-pay | Admitting: Endocrinology

## 2020-10-13 DIAGNOSIS — N1832 Chronic kidney disease, stage 3b: Secondary | ICD-10-CM

## 2020-10-18 ENCOUNTER — Other Ambulatory Visit: Payer: Self-pay | Admitting: Internal Medicine

## 2020-11-20 ENCOUNTER — Emergency Department (HOSPITAL_COMMUNITY): Payer: Medicare Other

## 2020-11-20 ENCOUNTER — Encounter (HOSPITAL_COMMUNITY): Payer: Self-pay

## 2020-11-20 ENCOUNTER — Other Ambulatory Visit: Payer: Self-pay

## 2020-11-20 ENCOUNTER — Inpatient Hospital Stay (HOSPITAL_COMMUNITY)
Admission: EM | Admit: 2020-11-20 | Discharge: 2020-11-22 | DRG: 871 | Disposition: A | Discharge status: Home Health | Payer: Medicare Other | Attending: Family Medicine | Admitting: Family Medicine

## 2020-11-20 DIAGNOSIS — F411 Generalized anxiety disorder: Secondary | ICD-10-CM | POA: Diagnosis present

## 2020-11-20 DIAGNOSIS — N390 Urinary tract infection, site not specified: Secondary | ICD-10-CM

## 2020-11-20 DIAGNOSIS — Z882 Allergy status to sulfonamides status: Secondary | ICD-10-CM

## 2020-11-20 DIAGNOSIS — Z8249 Family history of ischemic heart disease and other diseases of the circulatory system: Secondary | ICD-10-CM | POA: Diagnosis not present

## 2020-11-20 DIAGNOSIS — B961 Klebsiella pneumoniae [K. pneumoniae] as the cause of diseases classified elsewhere: Secondary | ICD-10-CM | POA: Diagnosis present

## 2020-11-20 DIAGNOSIS — D631 Anemia in chronic kidney disease: Secondary | ICD-10-CM | POA: Diagnosis not present

## 2020-11-20 DIAGNOSIS — N183 Chronic kidney disease, stage 3 unspecified: Secondary | ICD-10-CM

## 2020-11-20 DIAGNOSIS — Z79890 Hormone replacement therapy (postmenopausal): Secondary | ICD-10-CM

## 2020-11-20 DIAGNOSIS — Z801 Family history of malignant neoplasm of trachea, bronchus and lung: Secondary | ICD-10-CM | POA: Diagnosis not present

## 2020-11-20 DIAGNOSIS — Z79899 Other long term (current) drug therapy: Secondary | ICD-10-CM

## 2020-11-20 DIAGNOSIS — A419 Sepsis, unspecified organism: Secondary | ICD-10-CM | POA: Diagnosis not present

## 2020-11-20 DIAGNOSIS — E890 Postprocedural hypothyroidism: Secondary | ICD-10-CM | POA: Diagnosis present

## 2020-11-20 DIAGNOSIS — J309 Allergic rhinitis, unspecified: Secondary | ICD-10-CM | POA: Diagnosis present

## 2020-11-20 DIAGNOSIS — Z720 Tobacco use: Secondary | ICD-10-CM | POA: Diagnosis not present

## 2020-11-20 DIAGNOSIS — K219 Gastro-esophageal reflux disease without esophagitis: Secondary | ICD-10-CM | POA: Diagnosis present

## 2020-11-20 DIAGNOSIS — N3090 Cystitis, unspecified without hematuria: Secondary | ICD-10-CM | POA: Diagnosis not present

## 2020-11-20 DIAGNOSIS — M159 Polyosteoarthritis, unspecified: Secondary | ICD-10-CM | POA: Diagnosis not present

## 2020-11-20 DIAGNOSIS — R9431 Abnormal electrocardiogram [ECG] [EKG]: Secondary | ICD-10-CM | POA: Diagnosis not present

## 2020-11-20 DIAGNOSIS — E1165 Type 2 diabetes mellitus with hyperglycemia: Secondary | ICD-10-CM | POA: Diagnosis not present

## 2020-11-20 DIAGNOSIS — I129 Hypertensive chronic kidney disease with stage 1 through stage 4 chronic kidney disease, or unspecified chronic kidney disease: Secondary | ICD-10-CM | POA: Diagnosis not present

## 2020-11-20 DIAGNOSIS — N184 Chronic kidney disease, stage 4 (severe): Secondary | ICD-10-CM | POA: Diagnosis not present

## 2020-11-20 DIAGNOSIS — H409 Unspecified glaucoma: Secondary | ICD-10-CM | POA: Diagnosis present

## 2020-11-20 DIAGNOSIS — E782 Mixed hyperlipidemia: Secondary | ICD-10-CM | POA: Diagnosis not present

## 2020-11-20 DIAGNOSIS — E1122 Type 2 diabetes mellitus with diabetic chronic kidney disease: Secondary | ICD-10-CM | POA: Diagnosis present

## 2020-11-20 DIAGNOSIS — Z794 Long term (current) use of insulin: Secondary | ICD-10-CM

## 2020-11-20 DIAGNOSIS — Z7982 Long term (current) use of aspirin: Secondary | ICD-10-CM

## 2020-11-20 DIAGNOSIS — U071 COVID-19: Secondary | ICD-10-CM

## 2020-11-20 DIAGNOSIS — N3941 Urge incontinence: Secondary | ICD-10-CM

## 2020-11-20 DIAGNOSIS — M810 Age-related osteoporosis without current pathological fracture: Secondary | ICD-10-CM | POA: Diagnosis not present

## 2020-11-20 DIAGNOSIS — Z743 Need for continuous supervision: Secondary | ICD-10-CM | POA: Diagnosis not present

## 2020-11-20 DIAGNOSIS — E1140 Type 2 diabetes mellitus with diabetic neuropathy, unspecified: Secondary | ICD-10-CM | POA: Diagnosis not present

## 2020-11-20 DIAGNOSIS — D638 Anemia in other chronic diseases classified elsewhere: Secondary | ICD-10-CM | POA: Diagnosis present

## 2020-11-20 DIAGNOSIS — R531 Weakness: Secondary | ICD-10-CM | POA: Diagnosis not present

## 2020-11-20 DIAGNOSIS — E039 Hypothyroidism, unspecified: Secondary | ICD-10-CM | POA: Diagnosis present

## 2020-11-20 DIAGNOSIS — R7401 Elevation of levels of liver transaminase levels: Secondary | ICD-10-CM | POA: Diagnosis not present

## 2020-11-20 DIAGNOSIS — R404 Transient alteration of awareness: Secondary | ICD-10-CM | POA: Diagnosis not present

## 2020-11-20 DIAGNOSIS — R6889 Other general symptoms and signs: Secondary | ICD-10-CM | POA: Diagnosis not present

## 2020-11-20 DIAGNOSIS — Z888 Allergy status to other drugs, medicaments and biological substances status: Secondary | ICD-10-CM

## 2020-11-20 DIAGNOSIS — I10 Essential (primary) hypertension: Secondary | ICD-10-CM | POA: Diagnosis present

## 2020-11-20 DIAGNOSIS — I499 Cardiac arrhythmia, unspecified: Secondary | ICD-10-CM | POA: Diagnosis not present

## 2020-11-20 DIAGNOSIS — Z885 Allergy status to narcotic agent status: Secondary | ICD-10-CM

## 2020-11-20 DIAGNOSIS — Z833 Family history of diabetes mellitus: Secondary | ICD-10-CM

## 2020-11-20 LAB — URINALYSIS, ROUTINE W REFLEX MICROSCOPIC
Bilirubin Urine: NEGATIVE
Glucose, UA: 50 mg/dL — AB
Ketones, ur: NEGATIVE mg/dL
Nitrite: POSITIVE — AB
Protein, ur: >=300 mg/dL — AB
Specific Gravity, Urine: 1.01 (ref 1.005–1.030)
WBC, UA: >50 WBC/hpf — ABNORMAL HIGH (ref 0–5)
pH: 6 (ref 5.0–8.0)

## 2020-11-20 LAB — CBC WITH DIFFERENTIAL/PLATELET
Abs Immature Granulocytes: 0.01 10*3/uL (ref 0.00–0.07)
Basophils Absolute: 0.1 10*3/uL (ref 0.0–0.1)
Basophils Relative: 1 %
Eosinophils Absolute: 0.1 10*3/uL (ref 0.0–0.5)
Eosinophils Relative: 1 %
HCT: 36.4 % (ref 36.0–46.0)
Hemoglobin: 11.8 g/dL — ABNORMAL LOW (ref 12.0–15.0)
Immature Granulocytes: 0 %
Lymphocytes Relative: 11 %
Lymphs Abs: 0.7 10*3/uL (ref 0.7–4.0)
MCH: 32.2 pg (ref 26.0–34.0)
MCHC: 32.4 g/dL (ref 30.0–36.0)
MCV: 99.2 fL (ref 80.0–100.0)
Monocytes Absolute: 0.8 10*3/uL (ref 0.1–1.0)
Monocytes Relative: 13 %
Neutro Abs: 4.6 10*3/uL (ref 1.7–7.7)
Neutrophils Relative %: 74 %
Platelets: 221 10*3/uL (ref 150–400)
RBC: 3.67 MIL/uL — ABNORMAL LOW (ref 3.87–5.11)
RDW: 12.9 % (ref 11.5–15.5)
WBC: 6.3 10*3/uL (ref 4.0–10.5)
nRBC: 0 % (ref 0.0–0.2)

## 2020-11-20 LAB — COMPREHENSIVE METABOLIC PANEL
ALT: 22 U/L (ref 0–44)
AST: 59 U/L — ABNORMAL HIGH (ref 15–41)
Albumin: 4.1 g/dL (ref 3.5–5.0)
Alkaline Phosphatase: 41 U/L (ref 38–126)
Anion gap: 10 (ref 5–15)
BUN: 28 mg/dL — ABNORMAL HIGH (ref 8–23)
CO2: 20 mmol/L — ABNORMAL LOW (ref 22–32)
Calcium: 9.4 mg/dL (ref 8.9–10.3)
Chloride: 106 mmol/L (ref 98–111)
Creatinine, Ser: 1.8 mg/dL — ABNORMAL HIGH (ref 0.44–1.00)
GFR, Estimated: 27 mL/min — ABNORMAL LOW (ref >60)
Glucose, Bld: 194 mg/dL — ABNORMAL HIGH (ref 70–99)
Potassium: 4.5 mmol/L (ref 3.5–5.1)
Sodium: 136 mmol/L (ref 135–145)
Total Bilirubin: 0.8 mg/dL (ref 0.3–1.2)
Total Protein: 8 g/dL (ref 6.5–8.1)

## 2020-11-20 LAB — APTT: aPTT: 27 seconds (ref 24–36)

## 2020-11-20 LAB — GLUCOSE, CAPILLARY: Glucose-Capillary: 166 mg/dL — ABNORMAL HIGH (ref 70–99)

## 2020-11-20 LAB — LACTIC ACID, PLASMA
Lactic Acid, Venous: 1.3 mmol/L (ref 0.5–1.9)
Lactic Acid, Venous: 1.2 mmol/L (ref 0.5–1.9)

## 2020-11-20 LAB — RESP PANEL BY RT-PCR (FLU A&B, COVID) ARPGX2
Influenza A by PCR: NEGATIVE
Influenza B by PCR: NEGATIVE
SARS Coronavirus 2 by RT PCR: POSITIVE — AB

## 2020-11-20 LAB — PROTIME-INR
INR: 1.2 (ref 0.8–1.2)
Prothrombin Time: 14.9 seconds (ref 11.4–15.2)

## 2020-11-20 MED ORDER — ALBUTEROL SULFATE HFA 108 (90 BASE) MCG/ACT IN AERS: 2.0000 | INHALATION_SPRAY | Freq: Four times a day (QID) | RESPIRATORY_TRACT | Status: DC | PRN

## 2020-11-20 MED ORDER — LEVOTHYROXINE SODIUM 137 MCG PO TABS
137.0000 ug | ORAL_TABLET | Freq: Every day | ORAL | Status: DC
  Administered 2020-11-21 – 2020-11-22 (×2): 137 ug via ORAL
  Filled 2020-11-20 (×2): qty 1

## 2020-11-20 MED ORDER — ENOXAPARIN SODIUM 30 MG/0.3ML IJ SOSY
30.0000 mg | PREFILLED_SYRINGE | INTRAMUSCULAR | Status: DC
  Administered 2020-11-21 – 2020-11-22 (×2): 30 mg via SUBCUTANEOUS
  Filled 2020-11-20 (×2): qty 0.3

## 2020-11-20 MED ORDER — VANCOMYCIN HCL 1500 MG/300ML IV SOLN
1500.0000 mg | Freq: Once | INTRAVENOUS | Status: AC
  Administered 2020-11-20: 1500 mg via INTRAVENOUS
  Filled 2020-11-20: qty 300

## 2020-11-20 MED ORDER — ACETAMINOPHEN 325 MG PO TABS: 650.0000 mg | ORAL_TABLET | Freq: Four times a day (QID) | ORAL | Status: DC | PRN

## 2020-11-20 MED ORDER — GABAPENTIN 100 MG PO CAPS
100.0000 mg | ORAL_CAPSULE | Freq: Two times a day (BID) | ORAL | Status: DC
  Administered 2020-11-21 – 2020-11-22 (×4): 100 mg via ORAL
  Filled 2020-11-20 (×4): qty 1

## 2020-11-20 MED ORDER — SODIUM CHLORIDE 0.9 % IV BOLUS
2000.0000 mL | Freq: Once | INTRAVENOUS | Status: AC
  Administered 2020-11-20: 2000 mL via INTRAVENOUS

## 2020-11-20 MED ORDER — VANCOMYCIN HCL IN DEXTROSE 1-5 GM/200ML-% IV SOLN: 1000.0000 mg | INTRAVENOUS | Status: DC

## 2020-11-20 MED ORDER — LATANOPROST 0.005 % OP SOLN
1.0000 [drp] | Freq: Every day | OPHTHALMIC | Status: DC
  Administered 2020-11-21 – 2020-11-22 (×2): 1 [drp] via OPHTHALMIC
  Filled 2020-11-20 (×2): qty 2.5

## 2020-11-20 MED ORDER — INSULIN DETEMIR 100 UNIT/ML ~~LOC~~ SOLN
10.0000 [IU] | Freq: Every day | SUBCUTANEOUS | Status: DC
  Administered 2020-11-21 – 2020-11-22 (×2): 10 [IU] via SUBCUTANEOUS
  Filled 2020-11-20 (×4): qty 0.1

## 2020-11-20 MED ORDER — ACETAMINOPHEN 650 MG RE SUPP
650.0000 mg | Freq: Once | RECTAL | Status: AC
  Administered 2020-11-20: 650 mg via RECTAL
  Filled 2020-11-20: qty 1

## 2020-11-20 MED ORDER — AMLODIPINE BESYLATE 5 MG PO TABS
5.0000 mg | ORAL_TABLET | Freq: Every day | ORAL | Status: DC
  Administered 2020-11-21 – 2020-11-22 (×2): 5 mg via ORAL
  Filled 2020-11-20 (×2): qty 1

## 2020-11-20 MED ORDER — ZINC SULFATE 220 (50 ZN) MG PO CAPS
220.0000 mg | ORAL_CAPSULE | Freq: Every day | ORAL | Status: DC
  Administered 2020-11-20 – 2020-11-22 (×3): 220 mg via ORAL
  Filled 2020-11-20 (×3): qty 1

## 2020-11-20 MED ORDER — ASCORBIC ACID 500 MG PO TABS
500.0000 mg | ORAL_TABLET | Freq: Every day | ORAL | Status: DC
  Administered 2020-11-20 – 2020-11-22 (×3): 500 mg via ORAL
  Filled 2020-11-20 (×3): qty 1

## 2020-11-20 MED ORDER — INSULIN ASPART 100 UNIT/ML IJ SOLN
0.0000 [IU] | Freq: Every day | INTRAMUSCULAR | Status: DC
  Administered 2020-11-22: 2 [IU] via SUBCUTANEOUS

## 2020-11-20 MED ORDER — INSULIN ASPART 100 UNIT/ML IJ SOLN
0.0000 [IU] | Freq: Three times a day (TID) | INTRAMUSCULAR | Status: DC
  Administered 2020-11-21 – 2020-11-22 (×4): 2 [IU] via SUBCUTANEOUS
  Administered 2020-11-22: 3 [IU] via SUBCUTANEOUS

## 2020-11-20 MED ORDER — CEFTRIAXONE SODIUM 2 G IJ SOLR
2.0000 g | INTRAMUSCULAR | Status: DC
  Administered 2020-11-20 – 2020-11-21 (×2): 2 g via INTRAVENOUS
  Filled 2020-11-20 (×2): qty 20

## 2020-11-20 NOTE — Sepsis Progress Note (Signed)
Sepsis protocol followed by [facility] 

## 2020-11-20 NOTE — ED Notes (Addendum)
ED TO INPATIENT HANDOFF REPORT  ED Nurse Name and Phone #: Mel Almond RN [phone]  S Name/Age/Gender Jocelyn Mendez 87 y.o. female Room/Bed: APA18/APA18  Code Status   Code Status: Prior  Home/SNF/Other Home Patient oriented to: self, place, time, and situation Is this baseline? Yes   Triage Complete: Triage complete  Chief Complaint Sepsis [facility]) [A41.9]  Triage Note Pt arrived via EMS. Pt's family states that pt has had increased lethargy and weakness since yesterday. Hx of UTI's. EMS gave 1 gram of IM rocephin.    Allergies Allergies  Allergen Reactions   Codeine Other (See Comments)    REACTION: change in mental status   Diphenhydramine Hcl Itching   Quinine Other (See Comments)    REACTION: abnormal sensations in face   Sulfonamide Derivatives Rash    Level of Care/Admitting Diagnosis ED Disposition     ED Disposition  Admit   Condition  --   [place]: [facility] [id number]  Level of Care: Med-Surg [16]  Covid Evaluation: Confirmed COVID Positive  Diagnosis: Sepsis [facility]) [id number]  Admitting Physician: Bernadette Hoit [id number]  Attending Physician: Bernadette Hoit [id number]  Estimated length of stay: past midnight tomorrow  Certification:: I certify this patient will need inpatient services for at least 2 midnights          B Medical/Surgery History Past Medical History:  Diagnosis Date   Acute bronchitis    Anemia of other chronic disease    Anxiety state, unspecified    Chest pain, unspecified    Diverticulosis of colon (without mention of hemorrhage)    Esophageal reflux    Gallstones    Generalized osteoarthrosis, unspecified site    Headache(784.0)    Lumbago    Neuropathy in diabetes ([place])    bilat LE's   Osteoporosis    Other and unspecified hyperlipidemia    Other B-complex deficiencies    Type II or unspecified type diabetes mellitus without mention of complication, not stated as uncontrolled    Unspecified  disorder resulting from impaired renal function    Unspecified essential hypertension    Unspecified hypothyroidism    Past Surgical History:  Procedure Laterality Date   APPENDECTOMY     THYROIDECTOMY       A IV Location/Drains/Wounds Patient Lines/Drains/Airways Status     Active Line/Drains/Airways     Name Placement date Placement time Site Days   Peripheral IV 11/11/18 Right;Anterior Forearm 11/11/18  1642  Forearm  740   Peripheral IV 11/20/20 24 G Anterior;Distal;Left Forearm 11/20/20  1703  Forearm  less than 1   External Urinary Catheter 11/12/18  1040  --  739            Intake/Output Last 24 hours No intake or output data in the 24 hours ending 11/20/20 1939  Labs/Imaging Results for orders placed or performed during the hospital encounter of 11/20/20 (from the past 48 hour(s))  Lactic acid, plasma     Status: None   Collection Time: 11/20/20  5:34 PM  Result Value Ref Range   Lactic Acid, Venous 1.3 0.5 - 1.9 mmol/L    Comment: Performed at [facility], [address], [place], [place] [address]  Comprehensive metabolic panel     Status: Abnormal   Collection Time: 11/20/20  5:34 PM  Result Value Ref Range   Sodium 136 135 - 145 mmol/L   Potassium 4.5 3.5 - 5.1 mmol/L   Chloride 106 98 - 111 mmol/L   CO2  20 (L) 22 - 32 mmol/L   Glucose, Bld 194 (H) 70 - 99 mg/dL    Comment: Glucose reference range applies only to samples taken after fasting for at least 8 hours.   BUN 28 (H) 8 - 23 mg/dL   Creatinine, Ser 1.80 (H) 0.44 - 1.00 mg/dL   Calcium 9.4 8.9 - 10.3 mg/dL   Total Protein 8.0 6.5 - 8.1 g/dL   Albumin 4.1 3.5 - 5.0 g/dL   AST 59 (H) 15 - 41 U/L   ALT 22 0 - 44 U/L   Alkaline Phosphatase 41 38 - 126 U/L   Total Bilirubin 0.8 0.3 - 1.2 mg/dL   GFR, Estimated 27 (L) >60 mL/min    Comment: (NOTE) Calculated using the CKD-EPI Creatinine Equation (2021)    Anion gap 10 5 - 15    Comment: Performed at [facility], [address], [place],  [place] [address]  CBC WITH DIFFERENTIAL     Status: Abnormal   Collection Time: 11/20/20  5:34 PM  Result Value Ref Range   WBC 6.3 4.0 - 10.5 K/uL   RBC 3.67 (L) 3.87 - 5.11 MIL/uL   Hemoglobin 11.8 (L) 12.0 - 15.0 g/dL   HCT 36.4 36.0 - 46.0 %   MCV 99.2 80.0 - 100.0 fL   MCH 32.2 26.0 - 34.0 pg   MCHC 32.4 30.0 - 36.0 g/dL   RDW 12.9 11.5 - 15.5 %   Platelets 221 150 - 400 K/uL   nRBC 0.0 0.0 - 0.2 %   Neutrophils Relative % 74 %   Neutro Abs 4.6 1.7 - 7.7 K/uL   Lymphocytes Relative 11 %   Lymphs Abs 0.7 0.7 - 4.0 K/uL   Monocytes Relative 13 %   Monocytes Absolute 0.8 0.1 - 1.0 K/uL   Eosinophils Relative 1 %   Eosinophils Absolute 0.1 0.0 - 0.5 K/uL   Basophils Relative 1 %   Basophils Absolute 0.1 0.0 - 0.1 K/uL   Immature Granulocytes 0 %   Abs Immature Granulocytes 0.01 0.00 - 0.07 K/uL    Comment: Performed at [facility], [address] [place], [place] [address]  Protime-INR     Status: None   Collection Time: 11/20/20  5:34 PM  Result Value Ref Range   Prothrombin Time 14.9 11.4 - 15.2 seconds   INR 1.2 0.8 - 1.2    Comment: (NOTE) INR goal varies based on device and disease states. Performed at [facility], [address] [place], [place] [address]   APTT     Status: None   Collection Time: 11/20/20  5:34 PM  Result Value Ref Range   aPTT 27 24 - 36 seconds    Comment: Performed at [facility], [address], [place], [place] [address]  Blood Culture (routine x 2)     Status: None (Preliminary result)   Collection Time: 11/20/20  5:34 PM   Specimen: Blood  Result Value Ref Range   Specimen Description BLOOD LEFT ARM    Special Requests      BOTTLES DRAWN AEROBIC AND ANAEROBIC Blood Culture results may not be optimal due to an excessive volume of blood received in culture bottles Performed at [facility], [address], [place],  [address]    Culture PENDING    Report Status PENDING   Blood Culture (routine x 2)     Status: None (Preliminary result)    Collection Time: 11/20/20  5:34 PM   Specimen: Blood  Result Value Ref Range   Specimen Description BLOOD RIGHT FOREARM    Special Requests      BOTTLES DRAWN AEROBIC AND ANAEROBIC Blood Culture adequate volume Performed at [facility], [address], [place], [place] [address]    Culture PENDING    Report Status PENDING   Resp Panel by RT-PCR (Flu A&B, Covid) Nasopharyngeal Swab     Status: Abnormal   Collection Time: 11/20/20  6:05 PM   Specimen: Nasopharyngeal Swab; Nasopharyngeal(NP) swabs in vial transport medium  Result Value Ref Range   SARS Coronavirus 2 by RT PCR POSITIVE (A) NEGATIVE    Comment: CRITICAL RESULT CALLED TO, READ BACK BY AND VERIFIED WITH: belton,christie, 1910,11/20/2020,self s. (NOTE) SARS-CoV-2 target nucleic acids are DETECTED.  The SARS-CoV-2 RNA is generally detectable in upper respiratory specimens during the acute phase of infection. Positive results are indicative of the presence of the identified virus, but do not rule out bacterial infection or co-infection with other pathogens not detected by the test. Clinical correlation with patient history and other diagnostic information is necessary to determine patient infection status. The expected result is Negative.  Fact Sheet for Patients: EntrepreneurPulse.com.au  Fact Sheet for Healthcare Providers: IncredibleEmployment.be  This test is not yet approved or cleared by the [place] FDA and  has been authorized for detection and/or diagnosis of SARS-CoV-2 by FDA under an Emergency Use Authorization (EUA).  This EUA will remain in effect (meaning  this test can be used) for the duration of  the COVID-19 declaration under Section 564(b)(1) of the Act, 21 U.S.C. section 360bbb-3(b)(1), unless the authorization is terminated or revoked sooner.     Influenza A by PCR NEGATIVE NEGATIVE   Influenza B by PCR NEGATIVE NEGATIVE    Comment: (NOTE) The Xpert  Xpress SARS-CoV-2/FLU/RSV plus assay is intended as an aid in the diagnosis of influenza from Nasopharyngeal swab specimens and should not be used as a sole basis for treatment. Nasal washings and aspirates are unacceptable for Xpert Xpress SARS-CoV-2/FLU/RSV testing.  Fact Sheet for Patients: EntrepreneurPulse.com.au  Fact Sheet for Healthcare Providers: IncredibleEmployment.be  This test is not yet approved or cleared by the [place] FDA and has been authorized for detection and/or diagnosis of SARS-CoV-2 by FDA under an Emergency Use Authorization (EUA). This EUA will remain in effect (meaning this test can be used) for the duration of the COVID-19 declaration under Section 564(b)(1) of the Act, 21 U.S.C. section 360bbb-3(b)(1), unless the authorization is terminated or revoked.  Performed at [facility], [address], [place], [place] [address]   Urinalysis, Routine w reflex microscopic Urine, In & Out Cath     Status: Abnormal   Collection Time: 11/20/20  6:05 PM  Result Value Ref Range   Color, Urine YELLOW YELLOW   APPearance CLOUDY (A) CLEAR   Specific Gravity, Urine 1.010 1.005 - 1.030   pH 6.0 5.0 - 8.0   Glucose, UA 50 (A) NEGATIVE mg/dL   Hgb urine dipstick SMALL (A) NEGATIVE   Bilirubin Urine NEGATIVE NEGATIVE   Ketones, ur NEGATIVE NEGATIVE mg/dL   Protein, ur >=300 (A) NEGATIVE mg/dL   Nitrite POSITIVE (A) NEGATIVE   Leukocytes,Ua LARGE (A) NEGATIVE   RBC / HPF 0-5 0 - 5 RBC/hpf   WBC, UA >50 (H) 0 - 5 WBC/hpf   Bacteria, UA FEW (A) NONE SEEN   Squamous Epithelial / LPF 0-5 0 - 5   WBC Clumps PRESENT     Comment: Performed at [facility],  [address] [place], [place] [address]  Lactic acid, plasma     Status: None   Collection Time: 11/20/20  7:45 PM  Result Value Ref Range   Lactic Acid, Venous 1.2 0.5 - 1.9 mmol/L    Comment: Performed at [facility], [address], [place], [place] [address]   DG Chest  Port 1 View  Result Date: 11/20/2020 CLINICAL DATA:  Increasing lethargy and weakness EXAM: PORTABLE CHEST 1 VIEW COMPARISON:  11/11/2018 FINDINGS: Single frontal view of the chest demonstrates external defibrillator pad overlying left chest. The cardiac silhouette is unremarkable. No airspace disease, effusion, or pneumothorax. No acute bony abnormalities. IMPRESSION: 1. No acute intrathoracic process. Electronically Signed   By: Randa Ngo M.D.   On: 11/20/2020 18:40    Pending Labs Unresulted Labs (From admission, onward)     Start     Ordered   11/20/20 1718  Urine Culture  (Septic presentation on arrival (screening labs, nursing and treatment orders for obvious sepsis))  ONCE - STAT,   STAT       Question:  Indication  Answer:  Dysuria   11/20/20 1719   Signed and Held  Creatinine, serum  (enoxaparin (LOVENOX)    CrCl < 30 ml/min)  Weekly,   R     Comments: while on enoxaparin therapy.    Signed and Held   Signed and Held  Comprehensive metabolic panel  Tomorrow morning,   R        Signed and Held   Signed and Held  CBC  Tomorrow morning,   R        Signed and Held   Signed and Held  Protime-INR  Tomorrow morning,   R        Signed and Held   Signed and Held  APTT  Tomorrow morning,   R        Signed and Held   Signed and Held  Magnesium  Tomorrow morning,   R        Signed and Held   Signed and Held  Phosphorus  Tomorrow morning,   R        Signed and Held            Vitals/Pain Today's Vitals   11/20/20 1900 11/20/20 1915 11/20/20 1916 11/20/20 1936  BP: (!) 145/56     Pulse: 99 96    Resp: (!) 25 (!) 25    Temp:   98.8 F (37.1 C)   TempSrc:   Oral   SpO2: 96% 94%    Weight:      Height:      PainSc:    0-No pain    Isolation Precautions Airborne and Contact precautions  Medications Medications  cefTRIAXone (ROCEPHIN) 2 g in sodium chloride 0.9 % 100 mL IVPB (0 g Intravenous Stopping Infusion hung by another clincian 11/20/20 1900)  sodium chloride  0.9 % bolus 2,000 mL (2,000 mLs Intravenous New Bag/Given 11/20/20 1746)  acetaminophen (TYLENOL) suppository 650 mg (650 mg Rectal Given 11/20/20 1755)    Mobility walks with device, walks with walker, pt weak at this time, has not gotten up for this RN High fall risk   Focused Assessments Neuro Assessment Handoff:           Neuro Assessment: Exceptions to WDL Neuro Checks: initially confused on ED arrival, Pt confusions has improved. Pt currently A&Ox4, delayed responses, and increased sleepiness        R Recommendations: See Admitting Provider  Note  Report given to:   Additional Notes:   Pt placed on Zoll Pads by previous RN d/t Non-sustained VT. Since arrival of this RN pt has not had any re-occurring cardiac arrhythmias. - communicated to floor RN

## 2020-11-20 NOTE — ED Notes (Addendum)
Pt lethargic and unable to answer who legal guardian is. Daughter at bedside and unable to answer question as well. Daughter states that pt lives with her brother. No contact information for brother.

## 2020-11-20 NOTE — H&P (Signed)
History and Physical  Jocelyn Mendez SEG:[id number] DOB: 01/24/1931 DOA: 11/20/2020  Referring physician: Milton Ferguson, MD PCP: Hoyt Koch, MD  Patient coming from: Home  Chief Complaint: Fever and confusion  HPI: Jocelyn Mendez is a 87 y.o. female with medical history significant for hypertension, type 2 diabetes mellitus, CKD stage IV, urge incontinence, glaucoma, hyperlipidemia who presents to the emergency department due to fever and congestion.  Patient was unable to provide a history possibly due to being in a confused state at this time, history was obtained from ED physician and ED medical record.  Per report, EMS was activated by family due to increased weakness, confusion and fever and patient was taken to the ED for further evaluation and management.  ED Course:  In the emergency department, patient was noted to be febrile with a temperature of 103.50F, she was tachypneic and initially tachycardic.  Work-up in the ED showed normocytic anemia, BUN to creatinine 28/1.80 (baseline creatinine at 1.8-1.9).  AST 59, ALT 22.  Influenza A, B, negative.  SARS coronavirus 2 was positive. Chest x-ray showed no acute intrathoracic process Patient was treated with IV ceftriaxone, IV hydration was provided.  Hospitalist was asked to admit patient for further evaluation and management.  Review of Systems: This cannot be obtained at this time due to patient's current condition.  Past Medical History:  Diagnosis Date   Acute bronchitis    Anemia of other chronic disease    Anxiety state, unspecified    Chest pain, unspecified    Diverticulosis of colon (without mention of hemorrhage)    Esophageal reflux    Gallstones    Generalized osteoarthrosis, unspecified site    Headache(784.0)    Lumbago    Neuropathy in diabetes ([place])    bilat LE's   Osteoporosis    Other and unspecified hyperlipidemia    Other B-complex deficiencies    Type II or unspecified type diabetes  mellitus without mention of complication, not stated as uncontrolled    Unspecified disorder resulting from impaired renal function    Unspecified essential hypertension    Unspecified hypothyroidism    Past Surgical History:  Procedure Laterality Date   APPENDECTOMY     THYROIDECTOMY      Social History:  reports that she quit smoking about 47 years ago. Her smoking use included cigarettes. She has a 3.00 pack-year smoking history. She quit smokeless tobacco use about 10 years ago.  Her smokeless tobacco use included snuff. She reports that she does not drink alcohol and does not use drugs.   Allergies  Allergen Reactions   Codeine Other (See Comments)    REACTION: change in mental status   Diphenhydramine Hcl Itching   Quinine Other (See Comments)    REACTION: abnormal sensations in face   Sulfonamide Derivatives Rash    Family History  Problem Relation Age of Onset   Diabetes Mother    Cancer Father        unsure ? stomach   Heart attack Brother        x 3   Cancer Sister        unsure type   Lung cancer Brother        smoker   Heart attack Sister      Prior to Admission medications   Medication Sig Start Date End Date Taking? Authorizing Provider  albuterol (PROVENTIL HFA;VENTOLIN HFA) 108 (90 BASE) MCG/ACT inhaler Inhale 2 puffs into the lungs every 6 (six) hours as needed  for wheezing or shortness of breath. 04/06/14   Hoyt Koch, MD  amLODipine (NORVASC) 5 MG tablet TAKE 1 TABLET BY MOUTH EVERY DAY 07/25/20   Hoyt Koch, MD  aspirin 81 MG tablet Take 81 mg by mouth daily.    [provider]  Blood Glucose Monitoring Suppl (ACCU-CHEK GUIDE ME) w/Device KIT 1 each by Does not apply route 4 (four) times daily. E11.9 06/15/19   Renato Shin, MD  busPIRone (BUSPAR) 5 MG tablet TAKE 1 TABLET BY MOUTH TWICE DAILY 08/20/20   Hoyt Koch, MD  Cholecalciferol (VITAMIN D3) 2000 UNITS TABS Take 2,000 Units by mouth 2 (two) times daily.      [provider]  cyanocobalamin (,VITAMIN B-12,) 1000 MCG/ML injection INJECT ONE ML INTRAMUSCULARLY EVERY MONTH 10/18/20   Hoyt Koch, MD  fenofibrate 160 MG tablet TAKE 1 TABLET BY MOUTH DAILY 10/18/20   Hoyt Koch, MD  ferrous sulfate 325 (65 FE) MG EC tablet Take 325 mg by mouth 2 (two) times daily.    [provider]  gabapentin (NEURONTIN) 100 MG capsule TAKE ONE CAPSULE BY MOUTH THREE TIMES DAILY 10/18/20   Hoyt Koch, MD  Insulin NPH, Human,, Isophane, (HUMULIN N KWIKPEN) 100 UNIT/ML Kiwkpen Inject 54 Units into the skin every morning. 04/25/20   Renato Shin, MD  Lancet Devices (ADJUSTABLE LANCING DEVICE) MISC Apply 1 each topically daily. 02/02/20   [provider]  Lancets (ONETOUCH DELICA PLUS SKAJGO11X) Sebastian AS DIRECTED 09/18/20   Renato Shin, MD  latanoprost (XALATAN) 0.005 % ophthalmic solution Place 1 drop into both eyes at bedtime. 08/12/16   [provider]  levothyroxine (SYNTHROID) 137 MCG tablet TAKE 1 TABLET BY MOUTH DAILY BEFORE breakfast 06/11/20   Renato Shin, MD  lidocaine (LIDODERM) 5 % APPLY ONE PATCH TO SKIN EVERY DAY. REMOVE AND DISCARD PATCH WITHIN 12 HOURS OR AS DIRECTED BY MD - Patient taking differently: Place 1 patch onto the skin daily. 06/17/18   Hoyt Koch, MD  MYRBETRIQ 50 MG TB24 tablet TAKE 1 TABLET BY MOUTH DAILY 10/18/20   Hoyt Koch, MD  NEEDLE, DISP, 25 G (BD DISP NEEDLES) 25G X 5/8" MISC Use as directed to administer  the b12 injection monthly 10/05/14   Hoyt Koch, MD  neomycin-polymyxin-hydrocortisone (CORTISPORIN) OTIC solution Place 3 drops into both ears 3 (three) times daily. 03/03/19   Hoyt Koch, MD  [facility] VERIO test strip USE TO TEST FOUR TIMES DAILY 10/15/20   Renato Shin, MD  polyethylene glycol [facility] Floria Raveling) packet Take 17 g by mouth daily. 08/30/16   Lavina Hamman, MD  rosuvastatin (CRESTOR) 20 MG tablet TAKE 1  TABLET BY MOUTH DAILY 04/27/20   Hoyt Koch, MD  saccharomyces boulardii (FLORASTOR) 250 MG capsule Take 1 capsule (250 mg total) by mouth 2 (two) times daily. 08/26/16   Florencia Reasons, MD    Physical Exam: BP (!) 145/56   Pulse 96   Temp 98.8 F (37.1 C) (Oral)   Resp (!) 25   Ht _0  (1.676 m)   Wt 72.6 kg   SpO2 94%   BMI 25.82 kg/m   General: 87 y.o. year-old female well developed well nourished in no acute distress.  Alert and oriented x3. HEENT: NCAT, EOMI Neck: Supple, trachea medial Cardiovascular: Regular rate and rhythm with no rubs or gallops.  No thyromegaly or JVD noted.  No lower extremity edema. 2/4 pulses in all  4 extremities. Respiratory: Clear to auscultation with no wheezes or rales. Good inspiratory effort. Abdomen: Soft, nontender nondistended with normal bowel sounds x4 quadrants. Muskuloskeletal: No cyanosis, clubbing or edema noted bilaterally Neuro: CN II-XII intact, strength 5/5 x 4, sensation, reflexes intact Skin: No ulcerative lesions noted or rashes Psychiatry: Judgement and insight appear normal. Mood is appropriate for condition and setting          Labs on Admission:  Basic Metabolic Panel: Recent Labs  Lab 11/20/20 1734  NA 136  K 4.5  CL 106  CO2 20*  GLUCOSE 194*  BUN 28*  CREATININE 1.80*  CALCIUM 9.4   Liver Function Tests: Recent Labs  Lab 11/20/20 1734  AST 59*  ALT 22  ALKPHOS 41  BILITOT 0.8  PROT 8.0  ALBUMIN 4.1   No results for input(s): LIPASE, AMYLASE in the last 168 hours. No results for input(s): AMMONIA in the last 168 hours. CBC: Recent Labs  Lab 11/20/20 1734  WBC 6.3  NEUTROABS 4.6  HGB 11.8*  HCT 36.4  MCV 99.2  PLT 221   Cardiac Enzymes: No results for input(s): CKTOTAL, CKMB, CKMBINDEX, TROPONINI in the last 168 hours.  BNP (last 3 results) No results for input(s): BNP in the last 8760 hours.  ProBNP (last 3 results) No results for input(s): PROBNP in the last 8760 hours.  CBG: No  results for input(s): GLUCAP in the last 168 hours.  Radiological Exams on Admission: DG Chest Port 1 View  Result Date: 11/20/2020 CLINICAL DATA:  Increasing lethargy and weakness EXAM: PORTABLE CHEST 1 VIEW COMPARISON:  11/11/2018 FINDINGS: Single frontal view of the chest demonstrates external defibrillator pad overlying left chest. The cardiac silhouette is unremarkable. No airspace disease, effusion, or pneumothorax. No acute bony abnormalities. IMPRESSION: 1. No acute intrathoracic process. Electronically Signed   By: Randa Ngo M.D.   On: 11/20/2020 18:40    EKG: I independently viewed the EKG done and my findings are as followed: Sinus tachycardia at a rate of 103 bpm with increased PR interval  Assessment/Plan Present on Admission:  CKD (chronic kidney disease) stage 4, GFR 15-29 ml/min (HCC)  Hypothyroidism  Essential hypertension  Principal Problem:   Sepsis secondary to UTI [facility]) Active Problems:   Hypothyroidism   Mixed hyperlipidemia   Essential hypertension   CKD (chronic kidney disease) stage 4, GFR 15-29 ml/min (HCC)   COVID-19 virus infection   Hyperglycemia due to diabetes mellitus (HCC)   Elevated AST (SGOT)   Urge incontinence  Sepsis secondary to UTI POA Urinalysis was positive for UTI Patient met sepsis criteria due to being febrile, tachypneic and source of infection being urinary tract IV hydration per sepsis protocol was provided Patient was started on IV ceftriaxone Urine culture done on 11/11/2018 was sensitive to Klebsiella pneumoniae which was sensitive to ceftriaxone Urine culture done on 11/15/2018 grew Enterococcus avium which was sensitive to vancomycin We shall continue with vancomycin and ceftriaxone with plan to de-escalate/discontinue based on urine culture and blood culture  COVID-19 virus infection (incidental finding) Patient with no respiratory distress Chest x-ray showed no acute cardiopulmonary process Continue vitamin-C 500 mg  p.o. Daily Continue zinc 220 mg p.o. Daily Continue Tylenol p.r.n. for fever Continue airborne and contact precautions  Hyperglycemia secondary to type 2 diabetes mellitus Hemoglobin A1c on 10/05/2020 was 6.7 Continue ISS and hypoglycemic protocol Continue Levemir 10 units daily and adjust dose accordingly  Elevated AST AST 59, continue to monitor liver enzymes  Essential hypertension Continue amlodipine  Mixed hyperlipidemia Temporarily hold statin due to elevated AST  Hypothyroidism Continue Synthroid  Glaucoma Continue latanoprost  Other home meds: Continue albuterol as needed  DVT prophylaxis: Lovenox  Code Status: Full code  Family Communication: None at bedside  Disposition Plan:  Patient is from:                        home Anticipated DC to:                   SNF or family members home Anticipated DC date:               2-3 days Anticipated DC barriers:          Patient requires inpatient management due to sepsis secondary to UTI requiring IV antibiotics   Consults called: None  Admission status: Inpatient    Bernadette Hoit MD [facility]  11/20/2020, 7:46 PM

## 2020-11-20 NOTE — ED Provider Notes (Signed)
[facility] EMERGENCY DEPARTMENT Provider Note   CSN: [id number] Arrival date & time: 11/20/20  1659     History Chief Complaint  Patient presents with   Code Sepsis    Jocelyn Mendez is a 87 y.o. female.  Patient brought to the emergency department for fever and confusion.  She lives with her son.  The history is provided by a relative, the patient and the EMS personnel.  Weakness Severity:  Moderate Onset quality:  Sudden Timing:  Constant Progression:  Worsening Chronicity:  New Context: not alcohol use   Relieved by:  Nothing Worsened by:  Nothing Ineffective treatments:  None tried Associated symptoms: fever   Associated symptoms: no abdominal pain, no chest pain, no cough, no diarrhea, no frequency, no headaches and no seizures       Past Medical History:  Diagnosis Date   Acute bronchitis    Anemia of other chronic disease    Anxiety state, unspecified    Chest pain, unspecified    Diverticulosis of colon (without mention of hemorrhage)    Esophageal reflux    Gallstones    Generalized osteoarthrosis, unspecified site    Headache(784.0)    Lumbago    Neuropathy in diabetes ([place])    bilat LE's   Osteoporosis    Other and unspecified hyperlipidemia    Other B-complex deficiencies    Type II or unspecified type diabetes mellitus without mention of complication, not stated as uncontrolled    Unspecified disorder resulting from impaired renal function    Unspecified essential hypertension    Unspecified hypothyroidism     Patient Active Problem List   Diagnosis Date Noted   Urinary incontinence 09/22/2019   Routine general medical examination at a health care facility 03/05/2019   Left breast lump 12/08/2017   Allergic rhinitis 12/08/2017   Asthma 08/27/2016   Diabetes ([place]) 03/28/2015   Gait abnormality 05/13/2010   B12 deficiency 11/23/2007   ANEMIA OF CHRONIC DISEASE 01/21/2007   CKD (chronic kidney disease) stage 4, GFR 15-29 ml/min ([place])  01/21/2007   Hypothyroidism 11/21/2006   Hyperlipidemia associated with type 2 diabetes mellitus ([place]) 11/21/2006   Essential hypertension 11/21/2006   GERD 11/21/2006   DEGENERATIVE JOINT DISEASE, GENERALIZED 11/21/2006   Chronic bilateral low back pain without sciatica 11/21/2006   Osteoporosis 11/21/2006    Past Surgical History:  Procedure Laterality Date   APPENDECTOMY     THYROIDECTOMY       OB History   No obstetric history on file.     Family History  Problem Relation Age of Onset   Diabetes Mother    Cancer Father        unsure ? stomach   Heart attack Brother        x 3   Cancer Sister        unsure type   Lung cancer Brother        smoker   Heart attack Sister     Social History   Tobacco Use   Smoking status: Former    Packs/day: 0.50    Years: 6.00    Pack years: 3.00    Types: Cigarettes    Quit date: 02/10/1973    Years since quitting: 47.8   Smokeless tobacco: Former    Types: Snuff    Quit date: 04/07/2010  Vaping Use   Vaping Use: Never used  Substance Use Topics   Alcohol use: No    Alcohol/week: 0.0 standard drinks   Drug  use: No    Home Medications Prior to Admission medications   Medication Sig Start Date End Date Taking? Authorizing Provider  albuterol (PROVENTIL HFA;VENTOLIN HFA) 108 (90 BASE) MCG/ACT inhaler Inhale 2 puffs into the lungs every 6 (six) hours as needed for wheezing or shortness of breath. 04/06/14   Hoyt Koch, MD  amLODipine (NORVASC) 5 MG tablet TAKE 1 TABLET BY MOUTH EVERY DAY 07/25/20   Hoyt Koch, MD  aspirin 81 MG tablet Take 81 mg by mouth daily.    [provider]  Blood Glucose Monitoring Suppl (ACCU-CHEK GUIDE ME) w/Device KIT 1 each by Does not apply route 4 (four) times daily. E11.9 06/15/19   Renato Shin, MD  busPIRone (BUSPAR) 5 MG tablet TAKE 1 TABLET BY MOUTH TWICE DAILY 08/20/20   Hoyt Koch, MD  Cholecalciferol (VITAMIN D3) 2000 UNITS TABS Take 2,000 Units by  mouth 2 (two) times daily.     [provider]  cyanocobalamin (,VITAMIN B-12,) 1000 MCG/ML injection INJECT ONE ML INTRAMUSCULARLY EVERY MONTH 10/18/20   Hoyt Koch, MD  fenofibrate 160 MG tablet TAKE 1 TABLET BY MOUTH DAILY 10/18/20   Hoyt Koch, MD  ferrous sulfate 325 (65 FE) MG EC tablet Take 325 mg by mouth 2 (two) times daily.    [provider]  gabapentin (NEURONTIN) 100 MG capsule TAKE ONE CAPSULE BY MOUTH THREE TIMES DAILY 10/18/20   Hoyt Koch, MD  Insulin NPH, Human,, Isophane, (HUMULIN N KWIKPEN) 100 UNIT/ML Kiwkpen Inject 54 Units into the skin every morning. 04/25/20   Renato Shin, MD  Lancet Devices (ADJUSTABLE LANCING DEVICE) MISC Apply 1 each topically daily. 02/02/20   [provider]  Lancets (ONETOUCH DELICA PLUS LDJTTS17B) Page AS DIRECTED 09/18/20   Renato Shin, MD  latanoprost (XALATAN) 0.005 % ophthalmic solution Place 1 drop into both eyes at bedtime. 08/12/16   [provider]  levothyroxine (SYNTHROID) 137 MCG tablet TAKE 1 TABLET BY MOUTH DAILY BEFORE breakfast 06/11/20   Renato Shin, MD  lidocaine (LIDODERM) 5 % APPLY ONE PATCH TO SKIN EVERY DAY. REMOVE AND DISCARD PATCH WITHIN 12 HOURS OR AS DIRECTED BY MD - Patient taking differently: Place 1 patch onto the skin daily. 06/17/18   Hoyt Koch, MD  MYRBETRIQ 50 MG TB24 tablet TAKE 1 TABLET BY MOUTH DAILY 10/18/20   Hoyt Koch, MD  NEEDLE, DISP, 25 G (BD DISP NEEDLES) 25G X 5/8" MISC Use as directed to administer  the b12 injection monthly 10/05/14   Hoyt Koch, MD  neomycin-polymyxin-hydrocortisone (CORTISPORIN) OTIC solution Place 3 drops into both ears 3 (three) times daily. 03/03/19   Hoyt Koch, MD  [facility] VERIO test strip USE TO TEST FOUR TIMES DAILY 10/15/20   Renato Shin, MD  polyethylene glycol [facility] / Floria Raveling) packet Take 17 g by mouth daily. 08/30/16   Lavina Hamman, MD  rosuvastatin  (CRESTOR) 20 MG tablet TAKE 1 TABLET BY MOUTH DAILY 04/27/20   Hoyt Koch, MD  saccharomyces boulardii (FLORASTOR) 250 MG capsule Take 1 capsule (250 mg total) by mouth 2 (two) times daily. 08/26/16   Florencia Reasons, MD    Allergies    Codeine, Diphenhydramine hcl, Quinine, and Sulfonamide derivatives  Review of Systems   Review of Systems  Constitutional:  Positive for chills and fever. Negative for appetite change and fatigue.  HENT:  Negative for congestion, ear discharge and sinus pressure.   Eyes:  Negative for  discharge.  Respiratory:  Negative for cough.   Cardiovascular:  Negative for chest pain.  Gastrointestinal:  Negative for abdominal pain and diarrhea.  Genitourinary:  Negative for frequency and hematuria.  Musculoskeletal:  Negative for back pain.  Skin:  Negative for rash.  Neurological:  Positive for weakness. Negative for seizures and headaches.  Psychiatric/Behavioral:  Negative for hallucinations.    Physical Exam Updated Vital Signs BP (!) 145/56   Pulse 96   Temp 98.8 F (37.1 C) (Oral)   Resp (!) 25   Ht _0  (1.676 m)   Wt 72.6 kg   SpO2 94%   BMI 25.82 kg/m   Physical Exam Vitals and nursing note reviewed.  Constitutional:      Appearance: She is well-developed.  HENT:     Head: Normocephalic.     Comments: Dry mucous membranes    Nose: Congestion present.  Eyes:     General: No scleral icterus.    Conjunctiva/sclera: Conjunctivae normal.  Neck:     Thyroid: No thyromegaly.  Cardiovascular:     Rate and Rhythm: Regular rhythm.     Heart sounds: No murmur heard.   No friction rub. No gallop.  Pulmonary:     Breath sounds: No stridor. No wheezing or rales.  Chest:     Chest wall: No tenderness.  Abdominal:     General: There is no distension.     Tenderness: There is no abdominal tenderness. There is no rebound.  Musculoskeletal:        General: Normal range of motion.     Cervical back: Neck supple.  Lymphadenopathy:      Cervical: No cervical adenopathy.  Skin:    Findings: No erythema or rash.  Neurological:     Mental Status: She is oriented to person, place, and time.     Motor: No abnormal muscle tone.     Coordination: Coordination normal.  Psychiatric:        Behavior: Behavior normal.    ED Results / Procedures / Treatments   Labs (all labs ordered are listed, but only abnormal results are displayed) Labs Reviewed  RESP PANEL BY RT-PCR (FLU A&B, COVID) ARPGX2 - Abnormal; Notable for the following components:      Result Value   SARS Coronavirus 2 by RT PCR POSITIVE (*)    All other components within normal limits  COMPREHENSIVE METABOLIC PANEL - Abnormal; Notable for the following components:   CO2 20 (*)    Glucose, Bld 194 (*)    BUN 28 (*)    Creatinine, Ser 1.80 (*)    AST 59 (*)    GFR, Estimated 27 (*)    All other components within normal limits  CBC WITH DIFFERENTIAL/PLATELET - Abnormal; Notable for the following components:   RBC 3.67 (*)    Hemoglobin 11.8 (*)    All other components within normal limits  URINALYSIS, ROUTINE W REFLEX MICROSCOPIC - Abnormal; Notable for the following components:   APPearance CLOUDY (*)    Glucose, UA 50 (*)    Hgb urine dipstick SMALL (*)    Protein, ur >=300 (*)    Nitrite POSITIVE (*)    Leukocytes,Ua LARGE (*)    WBC, UA >50 (*)    Bacteria, UA FEW (*)    All other components within normal limits  CULTURE, BLOOD (ROUTINE X 2)  CULTURE, BLOOD (ROUTINE X 2)  URINE CULTURE  LACTIC ACID, PLASMA  LACTIC ACID, PLASMA  PROTIME-INR  APTT  EKG None  Radiology DG Chest Port 1 View  Result Date: 11/20/2020 CLINICAL DATA:  Increasing lethargy and weakness EXAM: PORTABLE CHEST 1 VIEW COMPARISON:  11/11/2018 FINDINGS: Single frontal view of the chest demonstrates external defibrillator pad overlying left chest. The cardiac silhouette is unremarkable. No airspace disease, effusion, or pneumothorax. No acute bony abnormalities.  IMPRESSION: 1. No acute intrathoracic process. Electronically Signed   By: Randa Ngo M.D.   On: 11/20/2020 18:40    Procedures Procedures   Medications Ordered in ED Medications  cefTRIAXone (ROCEPHIN) 2 g in sodium chloride 0.9 % 100 mL IVPB (2 g Intravenous New Bag/Given 11/20/20 1753)  sodium chloride 0.9 % bolus 2,000 mL (2,000 mLs Intravenous New Bag/Given 11/20/20 1746)  acetaminophen (TYLENOL) suppository 650 mg (650 mg Rectal Given 11/20/20 1755)    ED Course  I have reviewed the triage vital signs and the nursing notes.  Pertinent labs & imaging results that were available during my care of the patient were reviewed by me and considered in my medical decision making (see chart for details).   CRITICAL CARE Performed by: Milton Ferguson Total critical care time: 40 minutes Critical care time was exclusive of separately billable procedures and treating other patients. Critical care was necessary to treat or prevent imminent or life-threatening deterioration. Critical care was time spent personally by me on the following activities: development of treatment plan with patient and/or surrogate as well as nursing, discussions with consultants, evaluation of patient's response to treatment, examination of patient, obtaining history from patient or surrogate, ordering and performing treatments and interventions, ordering and review of laboratory studies, ordering and review of radiographic studies, pulse oximetry and re-evaluation of patient's condition.  MDM Rules/Calculators/A&P                           Patient with urinary tract infection and dehydration with high fever.  Along with confusion.  Patient will be admitted for antibiotics and observation Final Clinical Impression(s) / ED Diagnoses Final diagnoses:  Cystitis  Acute sepsis [facility])    Rx / DC Orders ED Discharge Orders     None        Milton Ferguson, MD 11/20/20 1935

## 2020-11-20 NOTE — ED Triage Notes (Signed)
Pt arrived via EMS. Pt's family states that pt has had increased lethargy and weakness since yesterday. Hx of UTI's. EMS gave 1 gram of IM rocephin.

## 2020-11-20 NOTE — Progress Notes (Signed)
Pharmacy Antibiotic Note  Jocelyn Mendez is a 87 y.o. female admitted on 11/20/2020 with UTI/sepsis.  Pharmacy has been consulted for vancomycin dosing. Also started on ceftriaxone per MD. Scr 1.8 on presentation (appears at baseline).  Plan: Vancomycin '1500mg'$  IV x 1; then 1g IV q48h. Goal AUC 400-550. Expected AUC: 445 SCr used: 1.8 Ceftriaxone 2g IV q24h per MD Monitor clinical progress, c/s, renal function F/u de-escalation plan/LOT, vancomycin levels as indicated   Height: '5\' 6"'$  (167.6 cm) Weight: 72.6 kg (160 lb) IBW/kg (Calculated) : 59.3  Temp (24hrs), Avg:100.1 F (37.8 C), Min:98.2 F (36.8 C), Max:103.4 F (39.7 C)  Recent Labs  Lab 11/20/20 1734 11/20/20 1945  WBC 6.3  --   CREATININE 1.80*  --   LATICACIDVEN 1.3 1.2    Estimated Creatinine Clearance: 22.5 mL/min (A) (by C-G formula based on SCr of 1.8 mg/dL (H)).    Allergies  Allergen Reactions   Codeine Other (See Comments)    REACTION: change in mental status   Diphenhydramine Hcl Itching   Quinine Other (See Comments)    REACTION: abnormal sensations in face   Sulfonamide Derivatives Rash     Arturo Morton, PharmD, BCPS Please check AMION for all [facility] contact numbers Clinical Pharmacist 11/20/2020 8:33 PM

## 2020-11-20 NOTE — ED Notes (Signed)
Report given to RN [place]. Per Floor RN pt's daughter ok to go up with pt.

## 2020-11-21 LAB — COMPREHENSIVE METABOLIC PANEL
ALT: 17 U/L (ref 0–44)
AST: 37 U/L (ref 15–41)
Albumin: 3.1 g/dL — ABNORMAL LOW (ref 3.5–5.0)
Alkaline Phosphatase: 29 U/L — ABNORMAL LOW (ref 38–126)
Anion gap: 7 (ref 5–15)
BUN: 28 mg/dL — ABNORMAL HIGH (ref 8–23)
CO2: 19 mmol/L — ABNORMAL LOW (ref 22–32)
Calcium: 8.3 mg/dL — ABNORMAL LOW (ref 8.9–10.3)
Chloride: 110 mmol/L (ref 98–111)
Creatinine, Ser: 1.77 mg/dL — ABNORMAL HIGH (ref 0.44–1.00)
GFR, Estimated: 27 mL/min — ABNORMAL LOW (ref >60)
Glucose, Bld: 185 mg/dL — ABNORMAL HIGH (ref 70–99)
Potassium: 4.2 mmol/L (ref 3.5–5.1)
Sodium: 136 mmol/L (ref 135–145)
Total Bilirubin: 0.7 mg/dL (ref 0.3–1.2)
Total Protein: 6.2 g/dL — ABNORMAL LOW (ref 6.5–8.1)

## 2020-11-21 LAB — CBC
HCT: 30.9 % — ABNORMAL LOW (ref 36.0–46.0)
Hemoglobin: 9.6 g/dL — ABNORMAL LOW (ref 12.0–15.0)
MCH: 31.3 pg (ref 26.0–34.0)
MCHC: 31.1 g/dL (ref 30.0–36.0)
MCV: 100.7 fL — ABNORMAL HIGH (ref 80.0–100.0)
Platelets: 179 10*3/uL (ref 150–400)
RBC: 3.07 MIL/uL — ABNORMAL LOW (ref 3.87–5.11)
RDW: 12.9 % (ref 11.5–15.5)
WBC: 4.4 10*3/uL (ref 4.0–10.5)
nRBC: 0 % (ref 0.0–0.2)

## 2020-11-21 LAB — GLUCOSE, CAPILLARY
Glucose-Capillary: 170 mg/dL — ABNORMAL HIGH (ref 70–99)
Glucose-Capillary: 193 mg/dL — ABNORMAL HIGH (ref 70–99)
Glucose-Capillary: 179 mg/dL — ABNORMAL HIGH (ref 70–99)
Glucose-Capillary: 225 mg/dL — ABNORMAL HIGH (ref 70–99)

## 2020-11-21 LAB — MAGNESIUM: Magnesium: 1.3 mg/dL — ABNORMAL LOW (ref 1.7–2.4)

## 2020-11-21 LAB — PHOSPHORUS: Phosphorus: 3.2 mg/dL (ref 2.5–4.6)

## 2020-11-21 LAB — PROTIME-INR
INR: 1.3 — ABNORMAL HIGH (ref 0.8–1.2)
Prothrombin Time: 16.1 seconds — ABNORMAL HIGH (ref 11.4–15.2)

## 2020-11-21 LAB — APTT: aPTT: 41 seconds — ABNORMAL HIGH (ref 24–36)

## 2020-11-21 MED ORDER — VITAMIN D 25 MCG (1000 UNIT) PO TABS
2000.0000 [IU] | ORAL_TABLET | Freq: Two times a day (BID) | ORAL | Status: DC
  Administered 2020-11-21 – 2020-11-22 (×3): 2000 [IU] via ORAL
  Filled 2020-11-21 (×3): qty 2

## 2020-11-21 MED ORDER — BUSPIRONE HCL 5 MG PO TABS
5.0000 mg | ORAL_TABLET | Freq: Two times a day (BID) | ORAL | Status: DC
  Administered 2020-11-21 – 2020-11-22 (×3): 5 mg via ORAL
  Filled 2020-11-21 (×3): qty 1

## 2020-11-21 MED ORDER — FENOFIBRATE 160 MG PO TABS
160.0000 mg | ORAL_TABLET | Freq: Every day | ORAL | Status: DC
  Administered 2020-11-21 – 2020-11-22 (×2): 160 mg via ORAL
  Filled 2020-11-21 (×2): qty 1

## 2020-11-21 MED ORDER — LIDOCAINE 5 % EX PTCH
1.0000 | MEDICATED_PATCH | Freq: Every day | CUTANEOUS | Status: DC
  Administered 2020-11-21: 1 via TRANSDERMAL
  Filled 2020-11-21 (×2): qty 1

## 2020-11-21 MED ORDER — LOPERAMIDE HCL 2 MG PO CAPS: 2.0000 mg | ORAL_CAPSULE | Freq: Four times a day (QID) | ORAL | Status: DC | PRN

## 2020-11-21 MED ORDER — SACCHAROMYCES BOULARDII 250 MG PO CAPS
250.0000 mg | ORAL_CAPSULE | Freq: Two times a day (BID) | ORAL | Status: DC
  Administered 2020-11-21 – 2020-11-22 (×3): 250 mg via ORAL
  Filled 2020-11-21 (×3): qty 1

## 2020-11-21 MED ORDER — FERROUS SULFATE 325 (65 FE) MG PO TABS
325.0000 mg | ORAL_TABLET | Freq: Every day | ORAL | Status: DC
  Administered 2020-11-22: 325 mg via ORAL
  Filled 2020-11-21: qty 1

## 2020-11-21 MED ORDER — MAGNESIUM SULFATE 4 GM/100ML IV SOLN
4.0000 g | Freq: Once | INTRAVENOUS | Status: AC
  Administered 2020-11-21: 4 g via INTRAVENOUS
  Filled 2020-11-21: qty 100

## 2020-11-21 MED ORDER — ASPIRIN EC 81 MG PO TBEC
81.0000 mg | DELAYED_RELEASE_TABLET | Freq: Every day | ORAL | Status: DC
  Administered 2020-11-21 – 2020-11-22 (×2): 81 mg via ORAL
  Filled 2020-11-21 (×2): qty 1

## 2020-11-21 MED ORDER — ROSUVASTATIN CALCIUM 20 MG PO TABS
20.0000 mg | ORAL_TABLET | Freq: Every evening | ORAL | Status: DC
  Administered 2020-11-21: 20 mg via ORAL
  Filled 2020-11-21: qty 1

## 2020-11-21 NOTE — Plan of Care (Signed)
  Problem: Acute Rehab PT Goals(only PT should resolve) Goal: Patient Will Transfer Sit To/From Stand Outcome: Progressing Flowsheets (Taken 11/21/2020 1418) Patient will transfer sit to/from stand: with supervision Goal: Pt Will Transfer Bed To Chair/Chair To Bed Outcome: Progressing Flowsheets (Taken 11/21/2020 1418) Pt will Transfer Bed to Chair/Chair to Bed: with supervision Goal: Pt Will Ambulate Outcome: Progressing Flowsheets (Taken 11/21/2020 1418) Pt will Ambulate:  25 feet  with rolling walker  with min guard assist Goal: Pt/caregiver will Perform Home Exercise Program Outcome: Progressing Flowsheets (Taken 11/21/2020 1418) Pt/caregiver will Perform Home Exercise Program:  For increased strengthening  For improved balance  With Supervision, verbal cues required/provided   Problem: Acute Rehab PT Goals(only PT should resolve) Goal: Patient Will Transfer Sit To/From Stand Outcome: Progressing Flowsheets (Taken 11/21/2020 1418) Patient will transfer sit to/from stand: with supervision Goal: Pt Will Transfer Bed To Chair/Chair To Bed Outcome: Progressing Flowsheets (Taken 11/21/2020 1418) Pt will Transfer Bed to Chair/Chair to Bed: with supervision Goal: Pt Will Ambulate Outcome: Progressing Flowsheets (Taken 11/21/2020 1418) Pt will Ambulate:  25 feet  with rolling walker  with min guard assist Goal: Pt/caregiver will Perform Home Exercise Program Outcome: Progressing Flowsheets (Taken 11/21/2020 1418) Pt/caregiver will Perform Home Exercise Program:  For increased strengthening  For improved balance  With Supervision, verbal cues required/provided  2:19 PM, 11/21/20 Mearl Latin PT, DPT Physical Therapist at [facility]

## 2020-11-21 NOTE — Progress Notes (Addendum)
PROGRESS NOTE   Jocelyn Mendez  ZOX:[id number] DOB: 03-26-1930 DOA: 11/20/2020 PCP: Hoyt Koch, MD   Chief Complaint  Patient presents with   Code Sepsis   Level of care: Med-Surg  Brief Admission History:  87 y.o. female with medical history significant for hypertension, type 2 diabetes mellitus, CKD stage IV, urge incontinence, glaucoma, hyperlipidemia who presents to the emergency department due to fever and congestion.  Patient was unable to provide a history possibly due to being in a confused state at this time, history was obtained from ED physician and ED medical record.  Per report, EMS was activated by family due to increased weakness, confusion and fever and patient was taken to the ED for further evaluation and management.  Assessment & Plan:   Principal Problem:   Sepsis secondary to UTI [facility]) Active Problems:   Hypothyroidism   Mixed hyperlipidemia   Essential hypertension   CKD (chronic kidney disease) stage 4, GFR 15-29 ml/min (HCC)   COVID-19 virus infection   Hyperglycemia due to diabetes mellitus (HCC)   Elevated AST (SGOT)   Urge incontinence   Sepsis secondary to UTI POA - Improving  Urinalysis was positive for UTI Patient met sepsis criteria due to being febrile, tachypneic and source of infection being urinary tract IV hydration per sepsis protocol was provided Patient was started on IV ceftriaxone Urine culture done on 11/11/2018 was sensitive to Klebsiella pneumoniae which was sensitive to ceftriaxone Urine culture done on 11/15/2018 grew Enterococcus avium which was sensitive to vancomycin We shall continue with vancomycin and ceftriaxone with plan to de-escalate/discontinue based on urine culture and blood culture   COVID-19 virus infection (incidental finding) Patient with no respiratory distress Chest x-ray showed no acute cardiopulmonary process Continue vitamin-C 500 mg p.o. Daily Continue zinc 220 mg p.o. Daily Continue Tylenol  p.r.n. for fever Continue airborne and contact precautions GFR is less than 30 and paxlovid is contraindicated   Hyperglycemia secondary to type 2 diabetes mellitus Hemoglobin A1c on 10/05/2020 was 6.7 Continue ISS and hypoglycemic protocol Continue Levemir 10 units daily and adjust dose accordingly CBG (last 3)  Recent Labs    11/20/20 2131 11/21/20 0754 11/21/20 1120  GLUCAP 166* 170* 193*    Elevated AST Likely related to Covid infection AST down to 37 today WNL  Essential hypertension Continue amlodipine  Mixed hyperlipidemia Temporarily hold statin due to elevated AST  Hypothyroidism Continue Synthroid  Glaucoma Continue latanoprost   Other home meds: Continue albuterol as needed  DVT prophylaxis: enoxaparin Code Status: Full  Family Communication: t/c attempt 10/12, left message Disposition: TBD Status is: Inpatient  Remains inpatient appropriate because:Inpatient level of care appropriate due to severity of illness  Dispo: The patient is from: Home              Anticipated d/c is to: Home              Patient currently is not medically stable to d/c.   Difficult to place patient No   Consultants:  PT   Procedures:  N/a   Antimicrobials:  Vanc 10/11>> Ceftriaxone 10/11>>   Subjective: Pt reports that she feels dizzy today.  She has been able to eat and drink today.  No SOB and no CP.    Objective: Vitals:   11/21/20 0514 11/21/20 0846 11/21/20 1042 11/21/20 1201  BP: (!) 123/93  (!) 134/47   Pulse: 83  81   Resp: 20  17   Temp: 98.1 F (36.7 C)  99.7 F (37.6 C)   TempSrc: Oral  Oral   SpO2: 96% 99% 96% 95%  Weight:      Height:        Intake/Output Summary (Last 24 hours) at 11/21/2020 1349 Last data filed at 11/21/2020 1200 Gross per 24 hour  Intake 240 ml  Output 275 ml  Net -35 ml   Filed Weights   11/20/20 1715  Weight: 72.6 kg    Examination:  General exam: frail, elderly female, awake, alert, cooperative, NAD,  sitting up in chair, Appears calm and comfortable  Respiratory system: ant upper airway congestion noises. Respiratory effort normal. Cardiovascular system: normal S1 & S2 heard. No JVD, murmurs, rubs, gallops or clicks. No pedal edema. Gastrointestinal system: Abdomen is nondistended, soft and nontender. No organomegaly or masses felt. Normal bowel sounds heard. Central nervous system: Alert and oriented to person and place. No focal neurological deficits. Extremities: Symmetric 5 x 5 power. Skin: No rashes, lesions or ulcers Psychiatry: Judgement and insight appear limited. Mood & affect appropriate.   Data Reviewed: I have personally reviewed following labs and imaging studies  CBC: Recent Labs  Lab 11/20/20 1734 11/21/20 0629  WBC 6.3 4.4  NEUTROABS 4.6  --   HGB 11.8* 9.6*  HCT 36.4 30.9*  MCV 99.2 100.7*  PLT 221 093    Basic Metabolic Panel: Recent Labs  Lab 11/20/20 1734 11/21/20 0629  NA 136 136  K 4.5 4.2  CL 106 110  CO2 20* 19*  GLUCOSE 194* 185*  BUN 28* 28*  CREATININE 1.80* 1.77*  CALCIUM 9.4 8.3*  MG  --  1.3*  PHOS  --  3.2    GFR: Estimated Creatinine Clearance: 22.8 mL/min (A) (by C-G formula based on SCr of 1.77 mg/dL (H)).  Liver Function Tests: Recent Labs  Lab 11/20/20 1734 11/21/20 0629  AST 59* 37  ALT 22 17  ALKPHOS 41 29*  BILITOT 0.8 0.7  PROT 8.0 6.2*  ALBUMIN 4.1 3.1*    CBG: Recent Labs  Lab 11/20/20 2131 11/21/20 0754 11/21/20 1120  GLUCAP 166* 170* 193*    Recent Results (from the past 240 hour(s))  Blood Culture (routine x 2)     Status: None (Preliminary result)   Collection Time: 11/20/20  5:34 PM   Specimen: BLOOD LEFT ARM  Result Value Ref Range Status   Specimen Description BLOOD LEFT ARM  Final   Special Requests   Final    BOTTLES DRAWN AEROBIC AND ANAEROBIC Blood Culture results may not be optimal due to an excessive volume of blood received in culture bottles   Culture   Final    NO GROWTH < 24  HOURS Performed at [facility], [address] [place], [place] [address]    Report Status PENDING  Incomplete  Blood Culture (routine x 2)     Status: None (Preliminary result)   Collection Time: 11/20/20  5:34 PM   Specimen: BLOOD RIGHT FOREARM  Result Value Ref Range Status   Specimen Description BLOOD RIGHT FOREARM  Final   Special Requests   Final    BOTTLES DRAWN AEROBIC AND ANAEROBIC Blood Culture adequate volume   Culture   Final    NO GROWTH < 24 HOURS Performed at [facility], [address] [place], [place] [address]    Report Status PENDING  Incomplete  Resp Panel by RT-PCR (Flu A&B, Covid) Nasopharyngeal Swab     Status: Abnormal   Collection Time: 11/20/20  6:05 PM  Specimen: Nasopharyngeal Swab; Nasopharyngeal(NP) swabs in vial transport medium  Result Value Ref Range Status   SARS Coronavirus 2 by RT PCR POSITIVE (A) NEGATIVE Final    Comment: CRITICAL RESULT CALLED TO, READ BACK BY AND VERIFIED WITH: belton,christie, 1910,11/20/2020,self s. (NOTE) SARS-CoV-2 target nucleic acids are DETECTED.  The SARS-CoV-2 RNA is generally detectable in upper respiratory specimens during the acute phase of infection. Positive results are indicative of the presence of the identified virus, but do not rule out bacterial infection or co-infection with other pathogens not detected by the test. Clinical correlation with patient history and other diagnostic information is necessary to determine patient infection status. The expected result is Negative.  Fact Sheet for Patients: EntrepreneurPulse.com.au  Fact Sheet for Healthcare Providers: IncredibleEmployment.be  This test is not yet approved or cleared by the [place] FDA and  has been authorized for detection and/or diagnosis of SARS-CoV-2 by FDA under an Emergency Use Authorization (EUA).  This EUA will remain in effect (meaning  this test can be used) for the duration of  the  COVID-19 declaration under Section 564(b)(1) of the Act, 21 U.S.C. section 360bbb-3(b)(1), unless the authorization is terminated or revoked sooner.     Influenza A by PCR NEGATIVE NEGATIVE Final   Influenza B by PCR NEGATIVE NEGATIVE Final    Comment: (NOTE) The Xpert Xpress SARS-CoV-2/FLU/RSV plus assay is intended as an aid in the diagnosis of influenza from Nasopharyngeal swab specimens and should not be used as a sole basis for treatment. Nasal washings and aspirates are unacceptable for Xpert Xpress SARS-CoV-2/FLU/RSV testing.  Fact Sheet for Patients: EntrepreneurPulse.com.au  Fact Sheet for Healthcare Providers: IncredibleEmployment.be  This test is not yet approved or cleared by the [place] FDA and has been authorized for detection and/or diagnosis of SARS-CoV-2 by FDA under an Emergency Use Authorization (EUA). This EUA will remain in effect (meaning this test can be used) for the duration of the COVID-19 declaration under Section 564(b)(1) of the Act, 21 U.S.C. section 360bbb-3(b)(1), unless the authorization is terminated or revoked.  Performed at [facility], [address] [place], [place] [address]      Radiology Studies: DG Chest [facility] 1 View  Result Date: 11/20/2020 CLINICAL DATA:  Increasing lethargy and weakness EXAM: PORTABLE CHEST 1 VIEW COMPARISON:  11/11/2018 FINDINGS: Single frontal view of the chest demonstrates external defibrillator pad overlying left chest. The cardiac silhouette is unremarkable. No airspace disease, effusion, or pneumothorax. No acute bony abnormalities. IMPRESSION: 1. No acute intrathoracic process. Electronically Signed   By: Randa Ngo M.D.   On: 11/20/2020 18:40    Scheduled Meds:  amLODipine  5 mg Oral Daily   vitamin C  500 mg Oral Daily   enoxaparin (LOVENOX) injection  30 mg Subcutaneous Q24H   gabapentin  100 mg Oral BID   insulin aspart  0-5 Units Subcutaneous QHS   insulin  aspart  0-9 Units Subcutaneous TID WC   insulin detemir  10 Units Subcutaneous Daily   latanoprost  1 drop Both Eyes QHS   levothyroxine  137 mcg Oral QAC breakfast   zinc sulfate  220 mg Oral Daily   Continuous Infusions:  cefTRIAXone (ROCEPHIN)  IV Stopped (11/20/20 1900)   [START ON 11/22/2020] vancomycin       LOS: 1 day   Time spent: 37 mins   Averyana Pillars Wynetta Emery, MD How to contact the [facility] Attending or Consulting provider [place] or covering provider during after hours [place], for this patient?  Check the care team in [facility] and look for a) attending/consulting TRH provider listed and b) the [facility] team listed Log into [email or web address] and use [place]'s universal password to access. If you do not have the password, please contact the hospital operator. Locate the [facility] provider you are looking for under [facility] and page to a number that you can be directly reached. If you still have difficulty reaching the provider, please page the [facility] (Director on Call) for the Hospitalists listed on amion for assistance.  11/21/2020, 1:49 PM

## 2020-11-21 NOTE — TOC Initial Note (Signed)
Transition of Care [facility]) - Initial/Assessment Note    Patient Details  Name: Jocelyn Mendez MRN: [id number] Date of Birth: Mar 02, 1930  Transition of Care [facility]) CM/SW Contact:    Ihor Gully, LCSW Phone Number: 11/21/2020, 4:34 PM  Clinical Narrative:                 Patient from home with son. Admitted COVID+. Uses rollator, has walker. Son assists with ADLs. SNF recommended. Daughters decline SNF. Agreeable to HHPT.  Referred to [facility] and accepted by Cindie.   Expected Discharge Plan: [place] Barriers to Discharge: Continued Medical Work up   Patient Goals and CMS Choice        Expected Discharge Plan and Services Expected Discharge Plan: [place] Acute Care Choice: [place] arrangements for the past 2 months: Single Family Home                                      Prior Living Arrangements/Services Living arrangements for the past 2 months: Single Family Home Lives with:: Adult Children Patient language and need for interpreter reviewed:: Yes Do you feel safe going back to the place where you live?: Yes      Need for Family Participation in Patient Care: Yes (Comment) Care giver support system in place?: Yes (comment) Current home services: DME Criminal Activity/Legal Involvement Pertinent to Current Situation/Hospitalization: No - Comment as needed  Activities of Daily Living Home Assistive Devices/Equipment: Walker (specify type) ADL Screening (condition at time of admission) Patient's cognitive ability adequate to safely complete daily activities?: Yes Is the patient deaf or have difficulty hearing?: No Does the patient have difficulty seeing, even when wearing glasses/contacts?: No Does the patient have difficulty concentrating, remembering, or making decisions?: No Patient able to express need for assistance with ADLs?: Yes Does the patient have difficulty dressing or bathing?:  No Independently performs ADLs?: Yes (appropriate for developmental age) Does the patient have difficulty walking or climbing stairs?: No Weakness of Legs: None Weakness of Arms/Hands: None  Permission Sought/Granted Permission sought to share information with : Family Supports    Share Information with NAME: Helene Kelp and [name]     Permission granted to share info w Relationship: daughters     Emotional Assessment     Affect (typically observed): Appropriate     Psych Involvement: No (comment)  Admission diagnosis:  Cystitis [N30.90] Sepsis ([place]) [A41.9] Acute sepsis ([place]) [A41.9] Patient Active Problem List   Diagnosis Date Noted   Sepsis secondary to UTI ([place]) 11/20/2020   COVID-19 virus infection 11/20/2020   Hyperglycemia due to diabetes mellitus ([place]) 11/20/2020   Elevated AST (SGOT) 11/20/2020   Urge incontinence 11/20/2020   Urinary incontinence 09/22/2019   Routine general medical examination at a health care facility 03/05/2019   Left breast lump 12/08/2017   Allergic rhinitis 12/08/2017   Asthma 08/27/2016   Diabetes ([place]) 03/28/2015   Gait abnormality 05/13/2010   B12 deficiency 11/23/2007   ANEMIA OF CHRONIC DISEASE 01/21/2007   CKD (chronic kidney disease) stage 4, GFR 15-29 ml/min ([place]) 01/21/2007   Hypothyroidism 11/21/2006   Mixed hyperlipidemia 11/21/2006   Essential hypertension 11/21/2006   GERD 11/21/2006   DEGENERATIVE JOINT DISEASE, GENERALIZED 11/21/2006   Chronic bilateral low back pain without sciatica 11/21/2006   Osteoporosis 11/21/2006   PCP:  Hoyt Koch, MD  Pharmacy:   [place], [place] [id number] [place] [id number] Phone: [phone] Fax: [phone]     Social Determinants of Health (SDOH) Interventions    Readmission Risk Interventions No flowsheet data found.

## 2020-11-21 NOTE — Evaluation (Signed)
Physical Therapy Evaluation Patient Details Name: Jocelyn Mendez MRN: [id number] DOB: August 28, 1930 Today's Date: 11/21/2020  History of Present Illness  Jocelyn Mendez is a 87 y.o. female with medical history significant for hypertension, type 2 diabetes mellitus, CKD stage IV, urge incontinence, glaucoma, hyperlipidemia who presents to the emergency department due to fever and congestion.  Patient was unable to provide a history possibly due to being in a confused state at this time, history was obtained from ED physician and ED medical record.  Per report, EMS was activated by family due to increased weakness, confusion and fever and patient was taken to the ED for further evaluation and management.   Clinical Impression  Patient functioning near baseline for functional mobility and is typically assisted with ADL by family. Patient requiring min G/A with mobility today secondary to weakness/balance deficits. Patient will likely be able to return home safely if family is able to continue assisting patient. Patient will benefit from continued physical therapy in hospital and recommended venue below to increase strength, balance, endurance for safe ADLs and gait.        Recommendations for follow up therapy are one component of a multi-disciplinary discharge planning process, led by the attending physician.  Recommendations may be updated based on patient status, additional functional criteria and insurance authorization.  Follow Up Recommendations Supervision for mobility/OOB;Supervision - Intermittent;SNF    Equipment Recommendations  None recommended by PT    Recommendations for Other Services       Precautions / Restrictions Precautions Precautions: Fall Restrictions Weight Bearing Restrictions: No      Mobility  Bed Mobility Overal bed mobility: Needs Assistance Bed Mobility: Supine to Sit;Sit to Supine     Supine to sit: Min assist;HOB elevated;Min guard Sit to supine:  Supervision   General bed mobility comments: slow, labored transition to seated EOB with bed rails    Transfers Overall transfer level: Needs assistance Equipment used: Rolling walker (2 wheeled) Transfers: Sit to/from [facility] Sit to Stand: Min guard;Min assist Stand pivot transfers: Min guard          Ambulation/Gait Ambulation/Gait assistance: Min guard Gait Distance (Feet): 8 Feet Assistive device: Rolling walker (2 wheeled)   Gait velocity: decreased   General Gait Details: slow, labored cadence at bedside with RW  Stairs            Wheelchair Mobility    Modified Rankin (Stroke Patients Only)       Balance Overall balance assessment: Needs assistance Sitting-balance support: No upper extremity supported Sitting balance-Leahy Scale: Good Sitting balance - Comments: seated EOB   Standing balance support: Bilateral upper extremity supported Standing balance-Leahy Scale: Fair Standing balance comment: fair with RW                             Pertinent Vitals/Pain Pain Assessment: No/denies pain    Home Living Family/patient expects to be discharged to:: Private residence Living Arrangements: Children Available Help at Discharge: Family Type of Home: House Home Access: Stairs to enter   Technical brewer of Steps: 1 Home Layout: One level Home Equipment: Environmental consultant - 2 wheels;Shower seat      Prior Function Level of Independence: Needs assistance   Gait / Transfers Assistance Needed: household ambulation with RW  ADL's / Homemaking Assistance Needed: son assists with all needs        Hand Dominance        Extremity/Trunk Assessment  Upper Extremity Assessment Upper Extremity Assessment: Generalized weakness    Lower Extremity Assessment Lower Extremity Assessment: Generalized weakness    Cervical / Trunk Assessment Cervical / Trunk Assessment: Normal  Communication   Communication: No  difficulties  Cognition Arousal/Alertness: Awake/alert Behavior During Therapy: WFL for tasks assessed/performed Overall Cognitive Status: Within Functional Limits for tasks assessed                                        General Comments      Exercises     Assessment/Plan    PT Assessment Patient needs continued PT services  PT Problem List Decreased strength;Decreased mobility;Decreased activity tolerance;Decreased balance       PT Treatment Interventions DME instruction;Therapeutic exercise;Gait training;Balance training;Stair training;Neuromuscular re-education;Functional mobility training;Therapeutic activities;Patient/family education    PT Goals (Current goals can be found in the Care Plan section)  Acute Rehab PT Goals Patient Stated Goal: return home PT Goal Formulation: With patient Time For Goal Achievement: 12/05/20 Potential to Achieve Goals: Good    Frequency Min 2X/week   Barriers to discharge        Co-evaluation               AM-PAC PT "6 Clicks" Mobility  Outcome Measure Help needed turning from your back to your side while in a flat bed without using bedrails?: A Little Help needed moving from lying on your back to sitting on the side of a flat bed without using bedrails?: A Little Help needed moving to and from a bed to a chair (including a wheelchair)?: A Little Help needed standing up from a chair using your arms (e.g., wheelchair or bedside chair)?: A Little Help needed to walk in hospital room?: A Little Help needed climbing 3-5 steps with a railing? : A Lot 6 Click Score: 17    End of Session Equipment Utilized During Treatment: Gait belt;Oxygen Activity Tolerance: Patient tolerated treatment well Patient left: in bed;with call bell/phone within reach;with bed alarm set Nurse Communication: Mobility status PT Visit Diagnosis: Unsteadiness on feet (R26.81);Other abnormalities of gait and mobility (R26.89);Muscle  weakness (generalized) (M62.81)    Time: [id number] PT Time Calculation (min) (ACUTE ONLY): 19 min   Charges:   PT Evaluation $PT Eval Low Complexity: 1 Low PT Treatments $Therapeutic Activity: 8-22 mins        2:17 PM, 11/21/20 Mearl Latin PT, DPT Physical Therapist at [facility]

## 2020-11-22 LAB — CBC WITH DIFFERENTIAL/PLATELET
Abs Immature Granulocytes: 0.02 10*3/uL (ref 0.00–0.07)
Basophils Absolute: 0 10*3/uL (ref 0.0–0.1)
Basophils Relative: 1 %
Eosinophils Absolute: 0.1 10*3/uL (ref 0.0–0.5)
Eosinophils Relative: 2 %
HCT: 32 % — ABNORMAL LOW (ref 36.0–46.0)
Hemoglobin: 9.8 g/dL — ABNORMAL LOW (ref 12.0–15.0)
Immature Granulocytes: 1 %
Lymphocytes Relative: 20 %
Lymphs Abs: 0.9 10*3/uL (ref 0.7–4.0)
MCH: 30.8 pg (ref 26.0–34.0)
MCHC: 30.6 g/dL (ref 30.0–36.0)
MCV: 100.6 fL — ABNORMAL HIGH (ref 80.0–100.0)
Monocytes Absolute: 0.6 10*3/uL (ref 0.1–1.0)
Monocytes Relative: 14 %
Neutro Abs: 2.6 10*3/uL (ref 1.7–7.7)
Neutrophils Relative %: 62 %
Platelets: 176 10*3/uL (ref 150–400)
RBC: 3.18 MIL/uL — ABNORMAL LOW (ref 3.87–5.11)
RDW: 12.8 % (ref 11.5–15.5)
WBC: 4.2 10*3/uL (ref 4.0–10.5)
nRBC: 0 % (ref 0.0–0.2)

## 2020-11-22 LAB — COMPREHENSIVE METABOLIC PANEL
ALT: 17 U/L (ref 0–44)
AST: 41 U/L (ref 15–41)
Albumin: 3.2 g/dL — ABNORMAL LOW (ref 3.5–5.0)
Alkaline Phosphatase: 30 U/L — ABNORMAL LOW (ref 38–126)
Anion gap: 7 (ref 5–15)
BUN: 28 mg/dL — ABNORMAL HIGH (ref 8–23)
CO2: 19 mmol/L — ABNORMAL LOW (ref 22–32)
Calcium: 8.8 mg/dL — ABNORMAL LOW (ref 8.9–10.3)
Chloride: 107 mmol/L (ref 98–111)
Creatinine, Ser: 1.72 mg/dL — ABNORMAL HIGH (ref 0.44–1.00)
GFR, Estimated: 28 mL/min — ABNORMAL LOW (ref >60)
Glucose, Bld: 170 mg/dL — ABNORMAL HIGH (ref 70–99)
Potassium: 4.6 mmol/L (ref 3.5–5.1)
Sodium: 133 mmol/L — ABNORMAL LOW (ref 135–145)
Total Bilirubin: 0.5 mg/dL (ref 0.3–1.2)
Total Protein: 6.5 g/dL (ref 6.5–8.1)

## 2020-11-22 LAB — MAGNESIUM: Magnesium: 2.2 mg/dL (ref 1.7–2.4)

## 2020-11-22 LAB — GLUCOSE, CAPILLARY
Glucose-Capillary: 162 mg/dL — ABNORMAL HIGH (ref 70–99)
Glucose-Capillary: 246 mg/dL — ABNORMAL HIGH (ref 70–99)

## 2020-11-22 MED ORDER — ZINC SULFATE 220 (50 ZN) MG PO CAPS: 220.0000 mg | ORAL_CAPSULE | Freq: Every day | ORAL | 0 refills | Status: DC

## 2020-11-22 MED ORDER — ASCORBIC ACID 500 MG PO TABS: 500.0000 mg | ORAL_TABLET | Freq: Every day | ORAL | 0 refills | Status: DC

## 2020-11-22 MED ORDER — HUMULIN N KWIKPEN 100 UNIT/ML ~~LOC~~ SUPN: 10.0000 [IU] | PEN_INJECTOR | SUBCUTANEOUS | 3 refills | Status: DC

## 2020-11-22 MED ORDER — CEFDINIR 300 MG PO CAPS: 300.0000 mg | ORAL_CAPSULE | Freq: Every day | ORAL | 0 refills | Status: AC

## 2020-11-22 MED ORDER — FERROUS SULFATE 325 (65 FE) MG PO TBEC: 325.0000 mg | DELAYED_RELEASE_TABLET | Freq: Every day | ORAL | 3 refills | Status: DC

## 2020-11-22 MED ORDER — CEFDINIR 300 MG PO CAPS
300.0000 mg | ORAL_CAPSULE | Freq: Every day | ORAL | Status: DC
  Administered 2020-11-22: 300 mg via ORAL
  Filled 2020-11-22: qty 1

## 2020-11-22 MED ORDER — FOSFOMYCIN TROMETHAMINE 3 G PO PACK: 3.0000 g | PACK | Freq: Once | ORAL | Status: DC

## 2020-11-22 NOTE — TOC Transition Note (Signed)
Transition of Care [facility]) - CM/SW Discharge Note   Patient Details  Name: Jocelyn Mendez MRN: [id number] Date of Birth: 09/10/1930  [facility]) CM/SW Contact:  Salome Arnt, LCSW Phone Number: 11/22/2020, 12:02 PM   Clinical Narrative:  Pt d/c today. LCSW notified pt that HHPT is set up with [facility]. She reports her family will pick her up today. Cindie with [facility] notified of d/c. Orders in. RN updated. Per RN, pt does not require home O2.      Final next level of care: Home w Home Health Services Barriers to Discharge: Barriers Resolved   Patient Goals and CMS Choice Patient states their goals for this hospitalization and ongoing recovery are:: return home   Choice offered to / list presented to : Patient  Discharge Placement                  Name of family member notified: pt Patient and family notified of of transfer: 11/22/20  Discharge Plan and Services     Post Acute Care Choice: Home Health          DME Arranged: N/A         HH Arranged: PT HH Agency: [place] Date [facility] Agency Contacted: 11/22/20 Time [place]: 1202 Representative spoke with at [place]: Cindie  Social Determinants of Health ([place]) Interventions     Readmission Risk Interventions No flowsheet data found.

## 2020-11-22 NOTE — Discharge Summary (Signed)
Physician Discharge Summary  Jocelyn Mendez HBZ:[id number] DOB: March 05, 1930 DOA: 11/20/2020  PCP: Hoyt Koch, MD  Admit date: 11/20/2020 Discharge date: 11/22/2020  Admitted From:  Home  Disposition:  Home with home health   Recommendations for Outpatient Follow-up:  Follow up with PCP in 1-2 weeks Please follow up on the following pending results:  Final urine culture and sensitivity results  Consider ambulatory referral to palliative care   Home Health:  resumption orders   Discharge Condition: STABLE   CODE STATUS: Full   DIET:  resume  prior home diet      Brief Hospitalization Summary: Please see all hospital notes, images, labs for full details of the hospitalization. ADMISSION HPI: Jocelyn Mendez is a 87 y.o. female with medical history significant for hypertension, type 2 diabetes mellitus, CKD stage IV, urge incontinence, glaucoma, hyperlipidemia who presents to the emergency department due to fever and congestion.  Patient was unable to provide a history possibly due to being in a confused state at this time, history was obtained from ED physician and ED medical record.  Per report, EMS was activated by family due to increased weakness, confusion and fever and patient was taken to the ED for further evaluation and management.   ED Course:  In the emergency department, patient was noted to be febrile with a temperature of 103.57F, she was tachypneic and initially tachycardic.  Work-up in the ED showed normocytic anemia, BUN to creatinine 28/1.80 (baseline creatinine at 1.8-1.9).  AST 59, ALT 22.  Influenza A, B, negative.  SARS coronavirus 2 was positive.  Chest x-ray showed no acute intrathoracic process Patient was treated with IV ceftriaxone, IV hydration was provided.  Hospitalist was asked to admit patient for further evaluation and management.  Hospital course by problem list   Sepsis secondary to UTI POA - RESOLVED   Urinalysis was positive for UTI Patient  met sepsis criteria due to being febrile, tachypneic and source of infection being urinary tract IV hydration per sepsis protocol was provided Patient was started on IV ceftriaxone UC with findings of gram neg rods, final ID and sensitivities pending.  DC home on oral cefdinir x 3 more days  Clinically much improved and wants to go home.    COVID-19 virus infection (incidental finding) Patient with no respiratory distress Chest x-ray showed no acute cardiopulmonary process Continue vitamin-C 500 mg p.o. Daily Continue zinc 220 mg p.o. Daily Continue Tylenol p.r.n. for fever Continue airborne and contact precautions GFR is less than 30 and paxlovid is contraindicated  Home O2 eval screen prior to discharge, may require home oxygen, will order if needed  Hyperglycemia secondary to type 2 diabetes mellitus Hemoglobin A1c on 10/05/2020 was 6.7 Used ISS and hypoglycemic protocol CBG (last 3)  Recent Labs    11/21/20 2107 11/22/20 0728 11/22/20 1103  GLUCAP 225* 162* 246*   Elevated AST Likely related to Covid infection AST down to 37 today WNL  Essential hypertension Continue amlodipine  Mixed hyperlipidemia Resume home treatment  Hypothyroidism Continue Synthroid  Glaucoma Continue latanoprost   Other home meds: Continue albuterol as needed  Discharge Diagnoses:  Principal Problem:   Sepsis secondary to UTI [facility]) Active Problems:   Hypothyroidism   Mixed hyperlipidemia   Essential hypertension   CKD (chronic kidney disease) stage 4, GFR 15-29 ml/min (HCC)   COVID-19 virus infection   Hyperglycemia due to diabetes mellitus (HCC)   Elevated AST (SGOT)   Urge incontinence  Discharge Instructions:  Allergies as  of 11/22/2020       Reactions   Codeine Other (See Comments)   REACTION: change in mental status   Diphenhydramine Hcl Itching   Quinine Other (See Comments)   REACTION: abnormal sensations in face   Sulfonamide Derivatives Rash         Medication List     STOP taking these medications    fenofibrate 160 MG tablet   Myrbetriq 50 MG Tb24 tablet Generic drug: mirabegron ER   neomycin-polymyxin-hydrocortisone OTIC solution Commonly known as: CORTISPORIN   polyethylene glycol 17 g packet Commonly known as: MIRALAX / GLYCOLAX       TAKE these medications    Accu-Chek Guide Me w/Device Kit 1 each by Does not apply route 4 (four) times daily. E11.9   acetaminophen 500 MG tablet Commonly known as: TYLENOL Take 500 mg by mouth every 6 (six) hours as needed.   Adjustable Lancing Device Misc Apply 1 each topically daily.   albuterol 108 (90 Base) MCG/ACT inhaler Commonly known as: VENTOLIN HFA Inhale 2 puffs into the lungs every 6 (six) hours as needed for wheezing or shortness of breath.   amLODipine 5 MG tablet Commonly known as: NORVASC TAKE 1 TABLET BY MOUTH EVERY DAY   ascorbic acid 500 MG tablet Commonly known as: VITAMIN C Take 1 tablet (500 mg total) by mouth daily. Start taking on: November 23, 2020   aspirin 81 MG tablet Take 81 mg by mouth daily.   busPIRone 5 MG tablet Commonly known as: BUSPAR TAKE 1 TABLET BY MOUTH TWICE DAILY   cefdinir 300 MG capsule Commonly known as: OMNICEF Take 1 capsule (300 mg total) by mouth daily for 3 days. Start taking on: November 23, 2020   cyanocobalamin 1000 MCG/ML injection Commonly known as: (VITAMIN B-12) INJECT ONE ML INTRAMUSCULARLY EVERY MONTH   ferrous sulfate 325 (65 FE) MG EC tablet Take 1 tablet (325 mg total) by mouth daily with breakfast. What changed: when to take this   gabapentin 100 MG capsule Commonly known as: NEURONTIN TAKE ONE CAPSULE BY MOUTH THREE TIMES DAILY What changed: when to take this   HumuLIN N KwikPen 100 UNIT/ML Kiwkpen Generic drug: Insulin NPH (Human) (Isophane) Inject 10 Units into the skin every morning. What changed: how much to take   latanoprost 0.005 % ophthalmic solution Commonly known as:  XALATAN Place 1 drop into both eyes at bedtime.   levothyroxine 137 MCG tablet Commonly known as: SYNTHROID TAKE 1 TABLET BY MOUTH DAILY BEFORE breakfast   lidocaine 5 % Commonly known as: LIDODERM APPLY ONE PATCH TO SKIN EVERY DAY. REMOVE AND DISCARD PATCH WITHIN 12 HOURS OR AS DIRECTED BY MD - What changed: See the new instructions.   loperamide 2 MG tablet Commonly known as: IMODIUM A-D Take 2 mg by mouth 4 (four) times daily as needed for diarrhea or loose stools.   NEEDLE (DISP) 25 G 25G X 5/8" Misc Commonly known as: BD Disp Needles Use as directed to administer  the b12 injection monthly   OneTouch Delica Plus [id number] Misc USE FOUR TIMES DAILY AS DIRECTED   OneTouch Verio test strip Generic drug: glucose blood USE TO TEST FOUR TIMES DAILY   rosuvastatin 20 MG tablet Commonly known as: CRESTOR TAKE 1 TABLET BY MOUTH DAILY   saccharomyces boulardii 250 MG capsule Commonly known as: FLORASTOR Take 1 capsule (250 mg total) by mouth 2 (two) times daily.   Vitamin D3 50 MCG (2000 UT) Tabs Take 2,000 Units by mouth  2 (two) times daily.   zinc sulfate 220 (50 Zn) MG capsule Take 1 capsule (220 mg total) by mouth daily. Start taking on: November 23, 2020        Follow-up Information     Hoyt Koch, MD. Schedule an appointment as soon as possible for a visit in 1 week(s).   Specialty: Internal Medicine Why: Hospital Follow Up Contact information: [place] [address] [place], [facility] Follow up.   Specialty: Home Health Services Why: Will contact you to schedule home health visits. Contact information: [address] [place] [address] [phone]                Allergies  Allergen Reactions   Codeine Other (See Comments)    REACTION: change in mental status   Diphenhydramine Hcl Itching   Quinine Other (See Comments)    REACTION: abnormal sensations in face    Sulfonamide Derivatives Rash   Allergies as of 11/22/2020       Reactions   Codeine Other (See Comments)   REACTION: change in mental status   Diphenhydramine Hcl Itching   Quinine Other (See Comments)   REACTION: abnormal sensations in face   Sulfonamide Derivatives Rash        Medication List     STOP taking these medications    fenofibrate 160 MG tablet   Myrbetriq 50 MG Tb24 tablet Generic drug: mirabegron ER   neomycin-polymyxin-hydrocortisone OTIC solution Commonly known as: CORTISPORIN   polyethylene glycol 17 g packet Commonly known as: MIRALAX / GLYCOLAX       TAKE these medications    Accu-Chek Guide Me w/Device Kit 1 each by Does not apply route 4 (four) times daily. E11.9   acetaminophen 500 MG tablet Commonly known as: TYLENOL Take 500 mg by mouth every 6 (six) hours as needed.   Adjustable Lancing Device Misc Apply 1 each topically daily.   albuterol 108 (90 Base) MCG/ACT inhaler Commonly known as: VENTOLIN HFA Inhale 2 puffs into the lungs every 6 (six) hours as needed for wheezing or shortness of breath.   amLODipine 5 MG tablet Commonly known as: NORVASC TAKE 1 TABLET BY MOUTH EVERY DAY   ascorbic acid 500 MG tablet Commonly known as: VITAMIN C Take 1 tablet (500 mg total) by mouth daily. Start taking on: November 23, 2020   aspirin 81 MG tablet Take 81 mg by mouth daily.   busPIRone 5 MG tablet Commonly known as: BUSPAR TAKE 1 TABLET BY MOUTH TWICE DAILY   cefdinir 300 MG capsule Commonly known as: OMNICEF Take 1 capsule (300 mg total) by mouth daily for 3 days. Start taking on: November 23, 2020   cyanocobalamin 1000 MCG/ML injection Commonly known as: (VITAMIN B-12) INJECT ONE ML INTRAMUSCULARLY EVERY MONTH   ferrous sulfate 325 (65 FE) MG EC tablet Take 1 tablet (325 mg total) by mouth daily with breakfast. What changed: when to take this   gabapentin 100 MG capsule Commonly known as: NEURONTIN TAKE ONE CAPSULE BY  MOUTH THREE TIMES DAILY What changed: when to take this   HumuLIN N KwikPen 100 UNIT/ML Kiwkpen Generic drug: Insulin NPH (Human) (Isophane) Inject 10 Units into the skin every morning. What changed: how much to take   latanoprost 0.005 % ophthalmic solution Commonly known as: XALATAN Place 1 drop into both eyes at bedtime.   levothyroxine 137 MCG tablet Commonly known as: SYNTHROID  TAKE 1 TABLET BY MOUTH DAILY BEFORE breakfast   lidocaine 5 % Commonly known as: LIDODERM APPLY ONE PATCH TO SKIN EVERY DAY. REMOVE AND DISCARD PATCH WITHIN 12 HOURS OR AS DIRECTED BY MD - What changed: See the new instructions.   loperamide 2 MG tablet Commonly known as: IMODIUM A-D Take 2 mg by mouth 4 (four) times daily as needed for diarrhea or loose stools.   NEEDLE (DISP) 25 G 25G X 5/8" Misc Commonly known as: BD Disp Needles Use as directed to administer  the b12 injection monthly   OneTouch Delica Plus [id number] Misc USE FOUR TIMES DAILY AS DIRECTED   OneTouch Verio test strip Generic drug: glucose blood USE TO TEST FOUR TIMES DAILY   rosuvastatin 20 MG tablet Commonly known as: CRESTOR TAKE 1 TABLET BY MOUTH DAILY   saccharomyces boulardii 250 MG capsule Commonly known as: FLORASTOR Take 1 capsule (250 mg total) by mouth 2 (two) times daily.   Vitamin D3 50 MCG (2000 UT) Tabs Take 2,000 Units by mouth 2 (two) times daily.   zinc sulfate 220 (50 Zn) MG capsule Take 1 capsule (220 mg total) by mouth daily. Start taking on: November 23, 2020        Procedures/Studies: [facility] Chest [place] 1 View  Result Date: 11/20/2020 CLINICAL DATA:  Increasing lethargy and weakness EXAM: PORTABLE CHEST 1 VIEW COMPARISON:  11/11/2018 FINDINGS: Single frontal view of the chest demonstrates external defibrillator pad overlying left chest. The cardiac silhouette is unremarkable. No airspace disease, effusion, or pneumothorax. No acute bony abnormalities. IMPRESSION: 1. No acute intrathoracic  process. Electronically Signed   By: Randa Ngo M.D.   On: 11/20/2020 18:40     Subjective: Pt reports that she is feeling much better and wants to go home.   No SOB and no chest pain.   Discharge Exam: Vitals:   11/22/20 0452 11/22/20 0955  BP: (!) 167/46 (!) 144/57  Pulse: 74   Resp: 20   Temp: 98.3 F (36.8 C)   SpO2: 96%    Vitals:   11/21/20 1809 11/21/20 2128 11/22/20 0452 11/22/20 0955  BP:  (!) 165/55 (!) 167/46 (!) 144/57  Pulse:  72 74   Resp:  20 20   Temp:  98.5 F (36.9 C) 98.3 F (36.8 C)   TempSrc:  Oral Oral   SpO2: 97% 96% 96%   Weight:      Height:       General exam: frail, elderly female, awake, alert, cooperative, NAD, sitting up in chair, Appears calm and comfortable  Respiratory system: No increased work of breathing. Respiratory effort normal. Cardiovascular system: normal S1 & S2 heard. No JVD, murmurs, rubs, gallops or clicks. No pedal edema. Gastrointestinal system: Abdomen is nondistended, soft and nontender. No organomegaly or masses felt. Normal bowel sounds heard. Central nervous system: Alert and oriented to person and place. No focal neurological deficits. Extremities: Symmetric 5 x 5 power. Skin: No rashes, lesions or ulcers Psychiatry: Judgement and insight appear limited. Mood & affect appropriate.    The results of significant diagnostics from this hospitalization (including imaging, microbiology, ancillary and laboratory) are listed below for reference.     Microbiology: Recent Results (from the past 240 hour(s))  Blood Culture (routine x 2)     Status: None (Preliminary result)   Collection Time: 11/20/20  5:34 PM   Specimen: BLOOD LEFT ARM  Result Value Ref Range Status   Specimen Description BLOOD LEFT ARM  Final   Special  Requests   Final    BOTTLES DRAWN AEROBIC AND ANAEROBIC Blood Culture results may not be optimal due to an excessive volume of blood received in culture bottles   Culture   Final    NO GROWTH 2  DAYS Performed at [facility], [address], [place], [place] [address]    Report Status PENDING  Incomplete  Blood Culture (routine x 2)     Status: None (Preliminary result)   Collection Time: 11/20/20  5:34 PM   Specimen: BLOOD RIGHT FOREARM  Result Value Ref Range Status   Specimen Description BLOOD RIGHT FOREARM  Final   Special Requests   Final    BOTTLES DRAWN AEROBIC AND ANAEROBIC Blood Culture adequate volume   Culture   Final    NO GROWTH 2 DAYS Performed at [facility], [address], [place], [place] [address]    Report Status PENDING  Incomplete  Resp Panel by RT-PCR (Flu A&B, Covid) Nasopharyngeal Swab     Status: Abnormal   Collection Time: 11/20/20  6:05 PM   Specimen: Nasopharyngeal Swab; Nasopharyngeal(NP) swabs in vial transport medium  Result Value Ref Range Status   SARS Coronavirus 2 by RT PCR POSITIVE (A) NEGATIVE Final    Comment: CRITICAL RESULT CALLED TO, READ BACK BY AND VERIFIED WITH: belton,christie, 1910,11/20/2020,self s. (NOTE) SARS-CoV-2 target nucleic acids are DETECTED.  The SARS-CoV-2 RNA is generally detectable in upper respiratory specimens during the acute phase of infection. Positive results are indicative of the presence of the identified virus, but do not rule out bacterial infection or co-infection with other pathogens not detected by the test. Clinical correlation with patient history and other diagnostic information is necessary to determine patient infection status. The expected result is Negative.  Fact Sheet for Patients: EntrepreneurPulse.com.au  Fact Sheet for Healthcare Providers: IncredibleEmployment.be  This test is not yet approved or cleared by the [place] FDA and  has been authorized for detection and/or diagnosis of SARS-CoV-2 by FDA under an Emergency Use Authorization (EUA).  This EUA will remain in effect (meaning  this test can be used) for the duration of  the COVID-19  declaration under Section 564(b)(1) of the Act, 21 U.S.C. section 360bbb-3(b)(1), unless the authorization is terminated or revoked sooner.     Influenza A by PCR NEGATIVE NEGATIVE Final   Influenza B by PCR NEGATIVE NEGATIVE Final    Comment: (NOTE) The Xpert Xpress SARS-CoV-2/FLU/RSV plus assay is intended as an aid in the diagnosis of influenza from Nasopharyngeal swab specimens and should not be used as a sole basis for treatment. Nasal washings and aspirates are unacceptable for Xpert Xpress SARS-CoV-2/FLU/RSV testing.  Fact Sheet for Patients: EntrepreneurPulse.com.au  Fact Sheet for Healthcare Providers: IncredibleEmployment.be  This test is not yet approved or cleared by the [place] FDA and has been authorized for detection and/or diagnosis of SARS-CoV-2 by FDA under an Emergency Use Authorization (EUA). This EUA will remain in effect (meaning this test can be used) for the duration of the COVID-19 declaration under Section 564(b)(1) of the Act, 21 U.S.C. section 360bbb-3(b)(1), unless the authorization is terminated or revoked.  Performed at [facility], [address], [place], [place] [address]   Urine Culture     Status: Abnormal (Preliminary result)   Collection Time: 11/20/20  6:05 PM   Specimen: In/Out Cath Urine  Result Value Ref Range Status   Specimen Description   Final    IN/OUT CATH URINE Performed at [facility], [address],  [place], [place] [address]    Special Requests   Final    NONE Performed at [facility], [address], [place], [place] [address]    Culture (A)  Final    80,000 COLONIES/mL GRAM NEGATIVE RODS IDENTIFICATION AND SUSCEPTIBILITIES TO FOLLOW Performed at [facility], [place] [address] [place], [place] [address]    Report Status PENDING  Incomplete     Labs: BNP (last 3 results) No results for input(s): BNP in the last 8760 hours. Basic Metabolic Panel: Recent Labs  Lab  11/20/20 1734 11/21/20 0629 11/22/20 0613  NA 136 136 133*  K 4.5 4.2 4.6  CL 106 110 107  CO2 20* 19* 19*  GLUCOSE 194* 185* 170*  BUN 28* 28* 28*  CREATININE 1.80* 1.77* 1.72*  CALCIUM 9.4 8.3* 8.8*  MG  --  1.3* 2.2  PHOS  --  3.2  --    Liver Function Tests: Recent Labs  Lab 11/20/20 1734 11/21/20 0629 11/22/20 0613  AST 59* 37 41  ALT _0 ALKPHOS 41 29* 30*  BILITOT 0.8 0.7 0.5  PROT 8.0 6.2* 6.5  ALBUMIN 4.1 3.1* 3.2*   No results for input(s): LIPASE, AMYLASE in the last 168 hours. No results for input(s): AMMONIA in the last 168 hours. CBC: Recent Labs  Lab 11/20/20 1734 11/21/20 0629 11/22/20 0613  WBC 6.3 4.4 4.2  NEUTROABS 4.6  --  2.6  HGB 11.8* 9.6* 9.8*  HCT 36.4 30.9* 32.0*  MCV 99.2 100.7* 100.6*  PLT 221 179 176   Cardiac Enzymes: No results for input(s): CKTOTAL, CKMB, CKMBINDEX, TROPONINI in the last 168 hours. BNP: Invalid input(s): POCBNP CBG: Recent Labs  Lab 11/21/20 1120 11/21/20 1616 11/21/20 2107 11/22/20 0728 11/22/20 1103  GLUCAP 193* 179* 225* 162* 246*   D-Dimer No results for input(s): DDIMER in the last 72 hours. Hgb A1c No results for input(s): HGBA1C in the last 72 hours. Lipid Profile No results for input(s): CHOL, HDL, LDLCALC, TRIG, CHOLHDL, LDLDIRECT in the last 72 hours. Thyroid function studies No results for input(s): TSH, T4TOTAL, T3FREE, THYROIDAB in the last 72 hours.  Invalid input(s): FREET3 Anemia work up No results for input(s): VITAMINB12, FOLATE, FERRITIN, TIBC, IRON, RETICCTPCT in the last 72 hours. Urinalysis    Component Value Date/Time   COLORURINE YELLOW 11/20/2020 1805   APPEARANCEUR CLOUDY (A) 11/20/2020 1805   LABSPEC 1.010 11/20/2020 1805   PHURINE 6.0 11/20/2020 1805   GLUCOSEU 50 (A) 11/20/2020 1805   GLUCOSEU NEGATIVE 09/03/2016 1227   HGBUR SMALL (A) 11/20/2020 1805   BILIRUBINUR NEGATIVE 11/20/2020 1805   KETONESUR NEGATIVE 11/20/2020 1805   PROTEINUR >=300 (A)  11/20/2020 1805   UROBILINOGEN 0.2 09/03/2016 1227   NITRITE POSITIVE (A) 11/20/2020 1805   LEUKOCYTESUR LARGE (A) 11/20/2020 1805   Sepsis Labs Invalid input(s): PROCALCITONIN,  WBC,  LACTICIDVEN Microbiology Recent Results (from the past 240 hour(s))  Blood Culture (routine x 2)     Status: None (Preliminary result)   Collection Time: 11/20/20  5:34 PM   Specimen: BLOOD LEFT ARM  Result Value Ref Range Status   Specimen Description BLOOD LEFT ARM  Final   Special Requests   Final    BOTTLES DRAWN AEROBIC AND ANAEROBIC Blood Culture results may not be optimal due to an excessive volume of blood received in culture bottles   Culture   Final    NO GROWTH 2 DAYS Performed at [facility], [address], [place], [place]  [address]    Report Status PENDING  Incomplete  Blood Culture (routine x 2)     Status: None (Preliminary result)   Collection Time: 11/20/20  5:34 PM   Specimen: BLOOD RIGHT FOREARM  Result Value Ref Range Status   Specimen Description BLOOD RIGHT FOREARM  Final   Special Requests   Final    BOTTLES DRAWN AEROBIC AND ANAEROBIC Blood Culture adequate volume   Culture   Final    NO GROWTH 2 DAYS Performed at [facility], [address] [place] [address] [place], [place] [address]    Report Status PENDING  Incomplete  Resp Panel by RT-PCR (Flu A&B, Covid) Nasopharyngeal Swab     Status: Abnormal   Collection Time: 11/20/20  6:05 PM   Specimen: Nasopharyngeal Swab; Nasopharyngeal(NP) swabs in vial transport medium  Result Value Ref Range Status   SARS Coronavirus 2 by RT PCR POSITIVE (A) NEGATIVE Final    Comment: CRITICAL RESULT CALLED TO, READ BACK BY AND VERIFIED WITH: belton,christie, 1910,11/20/2020,self s. (NOTE) SARS-CoV-2 target nucleic acids are DETECTED.  The SARS-CoV-2 RNA is generally detectable in upper respiratory specimens during the acute phase of infection. Positive results are indicative of the presence of the identified virus, but do not rule out bacterial  infection or co-infection with other pathogens not detected by the test. Clinical correlation with patient history and other diagnostic information is necessary to determine patient infection status. The expected result is Negative.  Fact Sheet for Patients: EntrepreneurPulse.com.au  Fact Sheet for Healthcare Providers: IncredibleEmployment.be  This test is not yet approved or cleared by the [place] FDA and  has been authorized for detection and/or diagnosis of SARS-CoV-2 by FDA under an Emergency Use Authorization (EUA).  This EUA will remain in effect (meaning  this test can be used) for the duration of  the COVID-19 declaration under Section 564(b)(1) of the Act, 21 U.S.C. section 360bbb-3(b)(1), unless the authorization is terminated or revoked sooner.     Influenza A by PCR NEGATIVE NEGATIVE Final   Influenza B by PCR NEGATIVE NEGATIVE Final    Comment: (NOTE) The Xpert Xpress SARS-CoV-2/FLU/RSV plus assay is intended as an aid in the diagnosis of influenza from Nasopharyngeal swab specimens and should not be used as a sole basis for treatment. Nasal washings and aspirates are unacceptable for Xpert Xpress SARS-CoV-2/FLU/RSV testing.  Fact Sheet for Patients: EntrepreneurPulse.com.au  Fact Sheet for Healthcare Providers: IncredibleEmployment.be  This test is not yet approved or cleared by the [place] FDA and has been authorized for detection and/or diagnosis of SARS-CoV-2 by FDA under an Emergency Use Authorization (EUA). This EUA will remain in effect (meaning this test can be used) for the duration of the COVID-19 declaration under Section 564(b)(1) of the Act, 21 U.S.C. section 360bbb-3(b)(1), unless the authorization is terminated or revoked.  Performed at [facility], [address], [place], [place] [address]   Urine Culture     Status: Abnormal (Preliminary result)   Collection  Time: 11/20/20  6:05 PM   Specimen: In/Out Cath Urine  Result Value Ref Range Status   Specimen Description   Final    IN/OUT CATH URINE Performed at [facility], [address] [place], [place] [address]    Special Requests   Final    NONE Performed at [facility], [address], [place], [place] [address]    Culture (A)  Final    80,000 COLONIES/mL GRAM NEGATIVE RODS IDENTIFICATION AND SUSCEPTIBILITIES TO FOLLOW Performed at [facility], [address] [place], [place] [address]    Report Status PENDING  Incomplete   Time coordinating discharge: 37 minutes   SIGNED:  Irwin Brakeman, MD  [facility] 11/22/2020, 11:33 AM How to contact the [facility] Attending or Consulting provider [place] or covering provider during after hours [place], for this patient?  Check the care team in [facility] and look for a) attending/consulting [facility] provider listed and b) the [facility] team listed Log into [email or web address] and use [place]'s universal password to access. If you do not have the password, please contact the hospital operator. Locate the [facility] provider you are looking for under [facility] and page to a number that you can be directly reached. If you still have difficulty reaching the provider, please page the [facility] (Director on Call) for the Hospitalists listed on amion for assistance.

## 2020-11-22 NOTE — Progress Notes (Signed)
SATURATION QUALIFICATIONS: (This note is used to comply with regulatory documentation for home oxygen)  Patient Saturations on Room Air at Rest = 98%  Patient Saturations on Room Air while Ambulating = 91%  Please briefly explain why patient needs home oxygen: Does not need home O2

## 2020-11-22 NOTE — Discharge Instructions (Signed)
IMPORTANT INFORMATION: PAY CLOSE ATTENTION   PHYSICIAN DISCHARGE INSTRUCTIONS  Follow with Primary care provider  Hoyt Koch, MD  and other consultants as instructed by your Hospitalist Physician  [place] IF SYMPTOMS COME BACK, WORSEN OR NEW PROBLEM DEVELOPS   Please note: You were cared for by a hospitalist during your hospital stay. Every effort will be made to forward records to your primary care provider.  You can request that your primary care provider send for your hospital records if they have not received them.  Once you are discharged, your primary care physician will handle any further medical issues. Please note that NO REFILLS for any discharge medications will be authorized once you are discharged, as it is imperative that you return to your primary care physician (or establish a relationship with a primary care physician if you do not have one) for your post hospital discharge needs so that they can reassess your need for medications and monitor your lab values.  Please get a complete blood count and chemistry panel checked by your Primary MD at your next visit, and again as instructed by your Primary MD.  Get Medicines reviewed and adjusted: Please take all your medications with you for your next visit with your Primary MD  Laboratory/radiological data: Please request your Primary MD to go over all hospital tests and procedure/radiological results at the follow up, please ask your primary care provider to get all Hospital records sent to his/her office.  In some cases, they will be blood work, cultures and biopsy results pending at the time of your discharge. Please request that your primary care provider follow up on these results.  If you are diabetic, please bring your blood sugar readings with you to your follow up appointment with primary care.    Please call and make your follow up appointments as soon as possible.    Also  Note the following: If you experience worsening of your admission symptoms, develop shortness of breath, life threatening emergency, suicidal or homicidal thoughts you must seek medical attention immediately by calling 911 or calling your MD immediately  if symptoms less severe.  You must read complete instructions/literature along with all the possible adverse reactions/side effects for all the Medicines you take and that have been prescribed to you. Take any new Medicines after you have completely understood and accpet all the possible adverse reactions/side effects.   Do not drive when taking Pain medications or sleeping medications (Benzodiazepines)  Do not take more than prescribed Pain, Sleep and Anxiety Medications. It is not advisable to combine anxiety,sleep and pain medications without talking with your primary care practitioner  Special Instructions: If you have smoked or chewed Tobacco  in the last 2 yrs please stop smoking, stop any regular Alcohol  and or any Recreational drug use.  Wear Seat belts while driving.  Do not drive if taking any narcotic, mind altering or controlled substances or recreational drugs or alcohol.

## 2020-11-22 NOTE — Progress Notes (Signed)
Patient discharged home today, transported by daughter. Discharge paperwork went over with patient and daughter, both verbalized understanding. Belongings sent home with patient. Patient stable upon discharge.

## 2020-11-23 LAB — URINE CULTURE: Culture: 80000 — AB

## 2020-11-25 LAB — CULTURE, BLOOD (ROUTINE X 2)
Culture: NO GROWTH
Culture: NO GROWTH
Special Requests: ADEQUATE

## 2020-11-26 ENCOUNTER — Other Ambulatory Visit: Payer: Self-pay

## 2020-11-26 DIAGNOSIS — I10 Essential (primary) hypertension: Secondary | ICD-10-CM

## 2020-11-28 ENCOUNTER — Telehealth: Payer: Self-pay | Admitting: *Deleted

## 2020-11-28 NOTE — Chronic Care Management (AMB) (Signed)
  Chronic Care Management   Outreach Note  11/28/2020 Name: [name] MRN: [id number] DOB: 12/15/1930  Jocelyn Mendez Stacy is a 87 y.o. year old female who is a primary care patient of Hoyt Koch, MD. I reached out to [name] by phone today in response to a referral sent by Ms. Alexcia L Stoecker's primary care provider.  An unsuccessful telephone outreach was attempted today. The patient was referred to the case management team for assistance with care management and care coordination.   Follow Up Plan: A HIPAA compliant phone message was left for the patient providing contact information and requesting a return call.  The care management team will reach out to the patient again over the next 7 days.  If patient returns call to provider office, please advise to call [place]* at [phone].*  [place] [facility]  Direct Dial: [phone]

## 2020-11-29 NOTE — Chronic Care Management (AMB) (Signed)
  Chronic Care Management   Outreach Note  11/29/2020 Name: Jocelyn Mendez MRN: [id number] DOB: [birth year]  [name] is a 87 y.o. year old female who is a primary care patient of Hoyt Koch, MD. I reached out to [name] by phone today in response to a referral sent by [name]'s primary care provider.  A second unsuccessful telephone outreach was attempted today. The patient was referred to the case management team for assistance with care management and care coordination.   Follow Up Plan: A HIPAA compliant phone message was left for the patient providing contact information and requesting a return call.  The care management team will reach out to the patient again over the next 7 days.  If patient returns call to provider office, please advise to call [place]* at [phone].*  [place] Management  Direct Dial: [phone]

## 2020-11-30 ENCOUNTER — Ambulatory Visit: Payer: Medicare Other | Admitting: Endocrinology

## 2020-12-03 ENCOUNTER — Telehealth: Payer: Self-pay | Admitting: *Deleted

## 2020-12-03 ENCOUNTER — Telehealth: Payer: Self-pay | Admitting: Internal Medicine

## 2020-12-03 NOTE — Chronic Care Management (AMB) (Signed)
  Chronic Care Management   Outreach Note  12/03/2020 Name: Jocelyn Mendez MRN: [id number] DOB: 17-Oct-1930  Jocelyn Mendez is a 87 y.o. year old female who is a primary care patient of Hoyt Koch, MD. I reached out to [name] by phone today in response to a referral sent by [name]'s primary care provider.  Third unsuccessful telephone outreach was attempted today. The patient was referred to the case management team for assistance with care management and care coordination. The patient's primary care provider has been notified of our unsuccessful attempts to make or maintain contact with the patient. The care management team is pleased to engage with this patient at any time in the future should he/she be interested in assistance from the care management team.   Follow Up Plan: A HIPAA compliant phone message was left for the patient providing contact information and requesting a return call.    [place] Management  Direct Dial: [phone]

## 2020-12-03 NOTE — Telephone Encounter (Signed)
Call transferred to office from [facility] with [place] to patients son Carloyn Manner [phone])  States patient had an allergic reaction to medication prescribed at discharge  Patient has a red splotchy rash all over stomach  Transferred call to Access Nurse for further triage, patient also has a video visit with Dr. Sharlet Salina on 10.25.22 at 2:00 pm  [facility] after hours advised they would need to call the patient back  Patient's son understood. Also gave the son after hours number for [place] if needed to call before hearing back from them

## 2020-12-03 NOTE — Chronic Care Management (AMB) (Signed)
  Chronic Care Management   Note  12/03/2020 Name: Jocelyn Mendez MRN: [id number] DOB: 16-Aug-1930  Jocelyn Mendez is a 87 y.o. year old female who is a primary care patient of Hoyt Koch, MD. I reached out to [name] by phone today in response to a referral sent by Jocelyn Mendez PCP.  Jocelyn Mendez was given information about Chronic Care Management services today including:  CCM service includes personalized support from designated clinical staff supervised by her physician, including individualized plan of care and coordination with other care providers 24/7 contact phone numbers for assistance for urgent and routine care needs. Service will only be billed when office clinical staff spend 20 minutes or more in a month to coordinate care. Only one practitioner may furnish and bill the service in a calendar month. The patient may stop CCM services at any time (effective at the end of the month) by phone call to the office staff. The patient is responsible for co-pay (up to 20% after annual deductible is met) if co-pay is required by the individual health plan.   Patient agreed to services and verbal consent obtained.   Follow up plan: Telephone appointment with care management team member scheduled for:12/12/20  [place] Management  Direct Dial: [phone]

## 2020-12-04 ENCOUNTER — Telehealth: Payer: Medicare Other | Admitting: Internal Medicine

## 2020-12-04 NOTE — Telephone Encounter (Signed)
Fyi.

## 2020-12-04 NOTE — Progress Notes (Signed)
Virtual Visit via Audio Note  I connected with Jocelyn Mendez on 12/04/20 at  2:00 PM EDT by an audio-only enabled telemedicine application and she was wanting to come in afternoon apt so this was scheduled for this week in person. Covid-19 positive while in hospital and outside quarantine window.  Hoyt Koch, MD

## 2020-12-05 ENCOUNTER — Encounter: Payer: Self-pay | Admitting: Internal Medicine

## 2020-12-05 ENCOUNTER — Other Ambulatory Visit: Payer: Self-pay

## 2020-12-05 ENCOUNTER — Ambulatory Visit (INDEPENDENT_AMBULATORY_CARE_PROVIDER_SITE_OTHER): Payer: Medicare Other | Admitting: Internal Medicine

## 2020-12-05 VITALS — BP 128/70 | HR 82 | Resp 18 | Ht 66.0 in

## 2020-12-05 DIAGNOSIS — E611 Iron deficiency: Secondary | ICD-10-CM | POA: Diagnosis not present

## 2020-12-05 DIAGNOSIS — E538 Deficiency of other specified B group vitamins: Secondary | ICD-10-CM

## 2020-12-05 DIAGNOSIS — N390 Urinary tract infection, site not specified: Secondary | ICD-10-CM

## 2020-12-05 DIAGNOSIS — I10 Essential (primary) hypertension: Secondary | ICD-10-CM

## 2020-12-05 DIAGNOSIS — E1122 Type 2 diabetes mellitus with diabetic chronic kidney disease: Secondary | ICD-10-CM

## 2020-12-05 DIAGNOSIS — N184 Chronic kidney disease, stage 4 (severe): Secondary | ICD-10-CM | POA: Diagnosis not present

## 2020-12-05 DIAGNOSIS — D638 Anemia in other chronic diseases classified elsewhere: Secondary | ICD-10-CM | POA: Diagnosis not present

## 2020-12-05 DIAGNOSIS — Z794 Long term (current) use of insulin: Secondary | ICD-10-CM | POA: Diagnosis not present

## 2020-12-05 DIAGNOSIS — N1831 Chronic kidney disease, stage 3a: Secondary | ICD-10-CM | POA: Diagnosis not present

## 2020-12-05 LAB — BASIC METABOLIC PANEL
BUN: 24 mg/dL — ABNORMAL HIGH (ref 6–23)
CO2: 26 mEq/L (ref 19–32)
Calcium: 9.4 mg/dL (ref 8.4–10.5)
Chloride: 110 mEq/L (ref 96–112)
Creatinine, Ser: 1.61 mg/dL — ABNORMAL HIGH (ref 0.40–1.20)
GFR: 28.51 mL/min — ABNORMAL LOW (ref >60.00)
Glucose, Bld: 90 mg/dL (ref 70–99)
Potassium: 4.1 mEq/L (ref 3.5–5.1)
Sodium: 140 mEq/L (ref 135–145)

## 2020-12-05 LAB — URINALYSIS, ROUTINE W REFLEX MICROSCOPIC
Bilirubin Urine: NEGATIVE
Hgb urine dipstick: NEGATIVE
Ketones, ur: NEGATIVE
Nitrite: NEGATIVE
Specific Gravity, Urine: 1.025 (ref 1.000–1.030)
Urine Glucose: NEGATIVE
Urobilinogen, UA: 0.2 (ref 0.0–1.0)
pH: 5.5 (ref 5.0–8.0)

## 2020-12-05 LAB — CBC WITH DIFFERENTIAL/PLATELET
Basophils Absolute: 0.1 10*3/uL (ref 0.0–0.1)
Basophils Relative: 1.8 % (ref 0.0–3.0)
Eosinophils Absolute: 0.3 10*3/uL (ref 0.0–0.7)
Eosinophils Relative: 4.3 % (ref 0.0–5.0)
HCT: 32.4 % — ABNORMAL LOW (ref 36.0–46.0)
Hemoglobin: 10.7 g/dL — ABNORMAL LOW (ref 12.0–15.0)
Lymphocytes Relative: 14.5 % (ref 12.0–46.0)
Lymphs Abs: 0.9 10*3/uL (ref 0.7–4.0)
MCHC: 33.2 g/dL (ref 30.0–36.0)
MCV: 94.1 fl (ref 78.0–100.0)
Monocytes Absolute: 0.7 10*3/uL (ref 0.1–1.0)
Monocytes Relative: 11.2 % (ref 3.0–12.0)
Neutro Abs: 4.3 10*3/uL (ref 1.4–7.7)
Neutrophils Relative %: 68.2 % (ref 43.0–77.0)
Platelets: 306 10*3/uL (ref 150.0–400.0)
RBC: 3.44 Mil/uL — ABNORMAL LOW (ref 3.87–5.11)
RDW: 13.2 % (ref 11.5–15.5)
WBC: 6.4 10*3/uL (ref 4.0–10.5)

## 2020-12-05 LAB — HEMOGLOBIN A1C: Hgb A1c MFr Bld: 7.5 % — ABNORMAL HIGH (ref 4.6–6.5)

## 2020-12-05 LAB — HEPATIC FUNCTION PANEL
ALT: 9 U/L (ref 0–35)
AST: 20 U/L (ref 0–37)
Albumin: 3.7 g/dL (ref 3.5–5.2)
Alkaline Phosphatase: 42 U/L (ref 39–117)
Bilirubin, Direct: 0.1 mg/dL (ref 0.0–0.3)
Total Bilirubin: 0.4 mg/dL (ref 0.2–1.2)
Total Protein: 6.9 g/dL (ref 6.0–8.3)

## 2020-12-05 LAB — IBC PANEL
Iron: 145 ug/dL (ref 42–145)
Saturation Ratios: 53.4 % — ABNORMAL HIGH (ref 20.0–50.0)
TIBC: 271.6 ug/dL (ref 250.0–450.0)
Transferrin: 194 mg/dL — ABNORMAL LOW (ref 212.0–360.0)

## 2020-12-05 LAB — FERRITIN: Ferritin: 1051.6 ng/mL — ABNORMAL HIGH (ref 10.0–291.0)

## 2020-12-05 LAB — VITAMIN B12: Vitamin B-12: 598 pg/mL (ref 211–911)

## 2020-12-05 MED ORDER — POLYSACCHARIDE IRON COMPLEX 150 MG PO CAPS: 150.0000 mg | ORAL_CAPSULE | Freq: Every day | ORAL | 1 refills | Status: DC

## 2020-12-05 NOTE — Assessment & Plan Note (Signed)
Lab Results  Component Value Date   CREATININE 1.61 (H) 12/05/2020   Stable overall, cont to avoid nephrotoxins

## 2020-12-05 NOTE — Assessment & Plan Note (Signed)
BP Readings from Last 3 Encounters:  12/05/20 128/70  11/22/20 (!) 139/50  10/05/20 120/60   Stable, pt to continue medical treatment norvasc

## 2020-12-05 NOTE — Assessment & Plan Note (Signed)
Lab Results  Component Value Date   WBC 6.4 12/05/2020   HGB 10.7 (L) 12/05/2020   HCT 32.4 (L) 12/05/2020   MCV 94.1 12/05/2020   PLT 306.0 12/05/2020   Overall stable, pt reassured,  to f/u any worsening symptoms or concerns

## 2020-12-05 NOTE — Assessment & Plan Note (Signed)
No recent iron studies done, will check iron/ferritin, also d/c iron sulfate and add niferex

## 2020-12-05 NOTE — Patient Instructions (Signed)
Ok to stop your iron sulfate and the zinc sulfate due to your sulfa allergy  Please take all new medication as prescribed - the niferex (iron pill)  - 1 per day  Please continue all other medications as before, and refills have been done if requested.  Please have the pharmacy call with any other refills you may need.  Please continue your efforts at being more active, low cholesterol diet, and weight control.  Please keep your appointments with your specialists as you may have planned  Please go to the LAB at the blood drawing area for the tests to be done - first floor  You will be contacted by phone if any changes need to be made immediately.  Otherwise, you will receive a letter about your results with an explanation, but please check with MyChart first.  Please remember to sign up for MyChart if you have not done so, as this will be important to you in the future with finding out test results, communicating by private email, and scheduling acute appointments online when needed.  Please see Dr Sharlet Salina in 1 month, or sooner if needed

## 2020-12-05 NOTE — Assessment & Plan Note (Signed)
Lab Results  Component Value Date   [id number] 373 12/05/2020   Stable, cont oral replacement - b12 1000 mcg qd

## 2020-12-05 NOTE — Assessment & Plan Note (Signed)
Asympt, ok for f/u UA per pt request

## 2020-12-05 NOTE — Progress Notes (Signed)
Patient ID: Jocelyn Mendez, female   DOB: November 11, 1930, 87 y.o.   MRN: [id number]        Chief Complaint: follow up recent hospn with daughter, PCP not available today, with uti, sepsis, confusion, anemia, htn, ckd       HPI:  Jocelyn Mendez is a 87 y.o. female here with recent hospn with UTI sepsis proven E coli pansensitive, recovered with IV rocephin then 3 days cefdnir post d/c.  Overall doing very well today, but very concerned she may have a persistent UTI though Pt denies chest pain, increased sob or doe, wheezing, orthopnea, PND, increased LE swelling, palpitations, dizziness or syncope.  Denies worsening depressive symptoms, suicidal ideation, or panic.  Also concerned about her sulfa allergy and rx for iron sulfate and zinc sulfate at d/c - does not want to take.  Has no overt bleeding but noted significant anemia, and even mentioned transfusion one time per pt, though none actually done.  Pt denies chest pain, increased sob or doe, wheezing, orthopnea, PND, increased LE swelling, palpitations, dizziness or syncope.   Pt denies polydipsia, polyuria, or new focal neuro s/s.  Taking b12.        Wt Readings from Last 3 Encounters:  11/20/20 160 lb (72.6 kg)  10/05/20 157 lb 6.4 oz (71.4 kg)  07/30/20 149 lb (67.6 kg)   BP Readings from Last 3 Encounters:  12/05/20 128/70  11/22/20 (!) 139/50  10/05/20 120/60         Past Medical History:  Diagnosis Date   Acute bronchitis    Anemia of other chronic disease    Anxiety state, unspecified    Chest pain, unspecified    Diverticulosis of colon (without mention of hemorrhage)    Esophageal reflux    Gallstones    Generalized osteoarthrosis, unspecified site    Headache(784.0)    Lumbago    Neuropathy in diabetes ([place])    bilat LE's   Osteoporosis    Other and unspecified hyperlipidemia    Other B-complex deficiencies    Type II or unspecified type diabetes mellitus without mention of complication, not stated as uncontrolled     Unspecified disorder resulting from impaired renal function    Unspecified essential hypertension    Unspecified hypothyroidism    Past Surgical History:  Procedure Laterality Date   APPENDECTOMY     THYROIDECTOMY      reports that she quit smoking about 47 years ago. Her smoking use included cigarettes. She has a 3.00 pack-year smoking history. She quit smokeless tobacco use about 10 years ago.  Her smokeless tobacco use included snuff. She reports that she does not drink alcohol and does not use drugs. family history includes Cancer in her father and sister; Diabetes in her mother; Heart attack in her brother and sister; Lung cancer in her brother. Allergies  Allergen Reactions   Codeine Other (See Comments)    REACTION: change in mental status   Diphenhydramine Hcl Itching   Quinine Other (See Comments)    REACTION: abnormal sensations in face   Sulfonamide Derivatives Rash   Current Outpatient Medications on File Prior to Visit  Medication Sig Dispense Refill   acetaminophen (TYLENOL) 500 MG tablet Take 500 mg by mouth every 6 (six) hours as needed.     albuterol (PROVENTIL HFA;VENTOLIN HFA) 108 (90 BASE) MCG/ACT inhaler Inhale 2 puffs into the lungs every 6 (six) hours as needed for wheezing or shortness of breath. 1 Inhaler 6   amLODipine (  NORVASC) 5 MG tablet TAKE 1 TABLET BY MOUTH EVERY DAY 90 tablet 1   ascorbic acid (VITAMIN C) 500 MG tablet Take 1 tablet (500 mg total) by mouth daily. 30 tablet 0   aspirin 81 MG tablet Take 81 mg by mouth daily.     Blood Glucose Monitoring Suppl (ACCU-CHEK GUIDE ME) w/Device KIT 1 each by Does not apply route 4 (four) times daily. E11.9 1 kit 0   busPIRone (BUSPAR) 5 MG tablet TAKE 1 TABLET BY MOUTH TWICE DAILY 60 tablet 3   Cholecalciferol (VITAMIN D3) 2000 UNITS TABS Take 2,000 Units by mouth 2 (two) times daily.      cyanocobalamin (,VITAMIN B-12,) 1000 MCG/ML injection INJECT ONE ML INTRAMUSCULARLY EVERY MONTH 1 mL 1   ferrous sulfate  325 (65 FE) MG EC tablet Take 1 tablet (325 mg total) by mouth daily with breakfast.  3   gabapentin (NEURONTIN) 100 MG capsule TAKE ONE CAPSULE BY MOUTH THREE TIMES DAILY (Patient taking differently: Take 100 mg by mouth 2 (two) times daily.) 90 capsule 1   Insulin NPH, Human,, Isophane, (HUMULIN N KWIKPEN) 100 UNIT/ML Kiwkpen Inject 10 Units into the skin every morning. 60 mL 3   Lancet Devices (ADJUSTABLE LANCING DEVICE) MISC Apply 1 each topically daily.     Lancets (ONETOUCH DELICA PLUS [id number]) MISC USE FOUR TIMES DAILY AS DIRECTED 300 each 1   latanoprost (XALATAN) 0.005 % ophthalmic solution Place 1 drop into both eyes at bedtime.  11   levothyroxine (SYNTHROID) 137 MCG tablet TAKE 1 TABLET BY MOUTH DAILY BEFORE breakfast 90 tablet 3   lidocaine (LIDODERM) 5 % APPLY ONE PATCH TO SKIN EVERY DAY. REMOVE AND DISCARD PATCH WITHIN 12 HOURS OR AS DIRECTED BY MD - (Patient taking differently: Place 1 patch onto the skin daily.) 30 patch 0   loperamide (IMODIUM A-D) 2 MG tablet Take 2 mg by mouth 4 (four) times daily as needed for diarrhea or loose stools.     NEEDLE, DISP, 25 G (BD DISP NEEDLES) 25G X 5/8" MISC Use as directed to administer  the b12 injection monthly 50 each 2   ONETOUCH VERIO test strip USE TO TEST FOUR TIMES DAILY 100 strip 5   rosuvastatin (CRESTOR) 20 MG tablet TAKE 1 TABLET BY MOUTH DAILY 90 tablet 3   saccharomyces boulardii (FLORASTOR) 250 MG capsule Take 1 capsule (250 mg total) by mouth 2 (two) times daily. 60 capsule 0   zinc sulfate 220 (50 Zn) MG capsule Take 1 capsule (220 mg total) by mouth daily. 30 capsule 0   No current facility-administered medications on file prior to visit.        ROS:  All others reviewed and negative.  Objective        PE:  BP 128/70   Pulse 82   Resp 18   Ht '5\' 6"'  (1.676 m)   SpO2 98%   BMI 25.82 kg/m                 Constitutional: Pt appears in NAD               HENT: Head: NCAT.                Right Ear: External ear  normal.                 Left Ear: External ear normal.                Eyes: . Pupils  are equal, round, and reactive to light. Conjunctivae and EOM are normal               Nose: without d/c or deformity               Neck: Neck supple. Gross normal ROM               Cardiovascular: Normal rate and regular rhythm.                 Pulmonary/Chest: Effort normal and breath sounds without rales or wheezing.                Abd:  Soft, NT, ND, + BS, no organomegaly               Neurological: Pt is alert. At baseline orientation, motor grossly intact               Skin: Skin is warm. No rashes, no other new lesions, LE edema - trace pedal               Psychiatric: Pt behavior is normal without agitation   Micro: none  Cardiac tracings I have personally interpreted today:  none  Pertinent Radiological findings (summarize): none   Lab Results  Component Value Date   WBC 6.4 12/05/2020   HGB 10.7 (L) 12/05/2020   HCT 32.4 (L) 12/05/2020   PLT 306.0 12/05/2020   GLUCOSE 90 12/05/2020   CHOL 134 03/03/2019   TRIG 233.0 (H) 03/03/2019   HDL 30.40 (L) 03/03/2019   LDLDIRECT 72.0 03/03/2019   LDLCALC 85 03/19/2011   ALT 9 12/05/2020   AST 20 12/05/2020   NA 140 12/05/2020   K 4.1 12/05/2020   CL 110 12/05/2020   CREATININE 1.61 (H) 12/05/2020   BUN 24 (H) 12/05/2020   CO2 26 12/05/2020   TSH 2.23 02/22/2020   INR 1.3 (H) 11/21/2020   HGBA1C 7.5 (H) 12/05/2020   MICROALBUR 43.3 (H) 09/20/2013   Assessment/Plan:  Jocelyn Mendez is a 87 y.o. White or Caucasian [1] female with  has a past medical history of Acute bronchitis, Anemia of other chronic disease, Anxiety state, unspecified, Chest pain, unspecified, Diverticulosis of colon (without mention of hemorrhage), Esophageal reflux, Gallstones, Generalized osteoarthrosis, unspecified site, Headache(784.0), Lumbago, Neuropathy in diabetes ([place]), Osteoporosis, Other and unspecified hyperlipidemia, Other B-complex deficiencies, Type II or  unspecified type diabetes mellitus without mention of complication, not stated as uncontrolled, Unspecified disorder resulting from impaired renal function, Unspecified essential hypertension, and Unspecified hypothyroidism.  B12 deficiency Lab Results  Component Value Date   [id number] 423 12/05/2020   Stable, cont oral replacement - b12 1000 mcg qd   Anemia, chronic disease Lab Results  Component Value Date   WBC 6.4 12/05/2020   HGB 10.7 (L) 12/05/2020   HCT 32.4 (L) 12/05/2020   MCV 94.1 12/05/2020   PLT 306.0 12/05/2020   Overall stable, pt reassured,  to f/u any worsening symptoms or concerns  Essential hypertension BP Readings from Last 3 Encounters:  12/05/20 128/70  11/22/20 (!) 139/50  10/05/20 120/60   Stable, pt to continue medical treatment norvasc   CKD (chronic kidney disease) stage 4, GFR 15-29 ml/min (HCC) Lab Results  Component Value Date   CREATININE 1.61 (H) 12/05/2020   Stable overall, cont to avoid nephrotoxins   Diabetes ([place]) Lab Results  Component Value Date   HGBA1C 7.5 (H) 12/05/2020   Stable, pt to continue current medical treatment NPH  Urinary tract infection without hematuria Asympt, ok for f/u UA per pt request  Iron deficiency No recent iron studies done, will check iron/ferritin, also d/c iron sulfate and add niferex  Followup: No follow-ups on file.  Cathlean Cower, MD 12/05/2020 8:50 PM [facility]

## 2020-12-05 NOTE — Assessment & Plan Note (Signed)
Lab Results  Component Value Date   HGBA1C 7.5 (H) 12/05/2020   Stable, pt to continue current medical treatment NPH

## 2020-12-06 LAB — URINE CULTURE

## 2020-12-12 ENCOUNTER — Encounter: Payer: Self-pay | Admitting: *Deleted

## 2020-12-12 ENCOUNTER — Telehealth: Payer: Medicare Other

## 2020-12-12 ENCOUNTER — Telehealth: Payer: Self-pay | Admitting: *Deleted

## 2020-12-12 NOTE — Telephone Encounter (Signed)
  Chronic Care Management   Follow Up Note   12/12/2020 Name: Jocelyn Mendez MRN: [id number] DOB: [birth year]  Referred by: Hoyt Koch, MD Reason for referral : Chronic Care Management (CCM RN CM Initial Outreach- Unsuccessful attempt)  An unsuccessful telephone outreach was attempted today. The patient was referred to the case management team for assistance with care management and care coordination.   Follow Up Plan:  A HIPPA compliant phone message was left for the patient providing contact information and requesting a return call Will place request with scheduling care guide to contact patient to re-schedule today's missed CCM RN initial telephone appointment  Oneta Rack, RN, BSN, [place] [phone]: direct office [phone]: mobile

## 2020-12-17 NOTE — Telephone Encounter (Signed)
Spoke with patient and son rescheduled for 12/20/20  Erline Levine

## 2020-12-20 ENCOUNTER — Ambulatory Visit (INDEPENDENT_AMBULATORY_CARE_PROVIDER_SITE_OTHER): Payer: Medicare Other | Admitting: *Deleted

## 2020-12-20 DIAGNOSIS — E785 Hyperlipidemia, unspecified: Secondary | ICD-10-CM

## 2020-12-20 DIAGNOSIS — E1122 Type 2 diabetes mellitus with diabetic chronic kidney disease: Secondary | ICD-10-CM

## 2020-12-20 DIAGNOSIS — Z794 Long term (current) use of insulin: Secondary | ICD-10-CM

## 2020-12-20 DIAGNOSIS — E1169 Type 2 diabetes mellitus with other specified complication: Secondary | ICD-10-CM

## 2020-12-20 NOTE — Patient Instructions (Signed)
Visit Jocelyn Mendez, it was nice talking with you today.    I look forward to talking to you again for a telephone update on Wednesday January 30, 2021 at 2:00 pm- please be listening out for my call that day.  I will call as close to 2:00 pm as possible.   If you need to cancel or re-schedule our telephone visit, please call [phone] and one of our care guides will be happy to assist you.   I look forward to hearing about your progress.   Please don't hesitate to contact me if I can be of assistance to you before our next scheduled telephone appointment.   Jocelyn Rack, RN, BSN, [facility] RN Care Coordination- [place] [phone]: direct office [phone]: mobile   Patient Self-Care Activities: [name] will: Self administer medications as prescribed Attend all scheduled provider appointments Call pharmacy for medication refills Call provider office for new concerns or questions Continue to check fasting (first thing in the morning, before eating) blood sugars at home Continue to follow heart healthy, low salt, low cholesterol, carbohydrate-modified, low sugar diet Review enclosed educational material   Cholesterol Content in Foods Cholesterol is a waxy, fat-like substance that helps to carry fat in the blood. The body needs cholesterol in small amounts, but too much cholesterol can cause damage to the arteries and heart. What foods have cholesterol? Cholesterol is found in animal-based foods, such as meat, seafood, and dairy. Generally, low-fat dairy and lean meats have less cholesterol than full-fat dairy and fatty meats. The milligrams of cholesterol per serving (mg per serving) of common cholesterol-containing foods are listed below. Meats and other proteins Egg -- one large whole egg has 186 mg. Veal shank -- 4 oz (113 g) has 141 mg. Lean ground [place] (93% lean) -- 4 oz (113 g) has 118 mg. Fat-trimmed lamb loin -- 4 oz  (113 g) has 106 mg. Lean ground beef (90% lean) -- 4 oz (113 g) has 100 mg. Lobster -- 3.5 oz (99 g) has 90 mg. Pork loin chops -- 4 oz (113 g) has 86 mg. Canned salmon -- 3.5 oz (99 g) has 83 mg. Fat-trimmed beef top loin -- 4 oz (113 g) has 78 mg. Frankfurter -- 1 frank (3.5 oz or 99 g) has 77 mg. Crab -- 3.5 oz (99 g) has 71 mg. Roasted chicken without skin, white meat -- 4 oz (113 g) has 66 mg. Light bologna -- 2 oz (57 g) has 45 mg. Deli-cut [place] -- 2 oz (57 g) has 31 mg. Canned tuna -- 3.5 oz (99 g) has 31 mg. Jocelyn Mendez -- 1 oz (28 g) has 29 mg. Oysters and mussels (raw) -- 3.5 oz (99 g) has 25 mg. Mackerel -- 1 oz (28 g) has 22 mg. Trout -- 1 oz (28 g) has 20 mg. Pork sausage -- 1 link (1 oz or 28 g) has 17 mg. Salmon -- 1 oz (28 g) has 16 mg. Tilapia -- 1 oz (28 g) has 14 mg. Dairy Soft-serve ice cream --  cup (4 oz or 86 g) has 103 mg. Whole-milk yogurt -- 1 cup (8 oz or 245 g) has 29 mg. Cheddar cheese -- 1 oz (28 g) has 28 mg. American cheese -- 1 oz (28 g) has 28 mg. Whole milk -- 1 cup (8 oz or 250 mL) has 23 mg. 2% milk -- 1 cup (8 oz or 250 mL) has 18 mg. Cream  cheese -- 1 tablespoon (Tbsp) (14.5 g) has 15 mg. Cottage cheese --  cup (4 oz or 113 g) has 14 mg. Low-fat (1%) milk -- 1 cup (8 oz or 250 mL) has 10 mg. Sour cream -- 1 Tbsp (12 g) has 8.5 mg. Low-fat yogurt -- 1 cup (8 oz or 245 g) has 8 mg. Nonfat Greek yogurt -- 1 cup (8 oz or 228 g) has 7 mg. Half-and-half cream -- 1 Tbsp (15 mL) has 5 mg. Fats and oils Cod liver oil -- 1 tablespoon (Tbsp) (13.6 g) has 82 mg. Butter -- 1 Tbsp (14 g) has 15 mg. Lard -- 1 Tbsp (12.8 g) has 14 mg. Bacon grease -- 1 Tbsp (12.9 g) has 14 mg. Mayonnaise -- 1 Tbsp (13.8 g) has 5-10 mg. Margarine -- 1 Tbsp (14 g) has 3-10 mg. The items listed above may not be a complete list of foods with cholesterol. Exact amounts of cholesterol in these foods may vary depending on specific ingredients and brands. Contact a dietitian for  more information. What foods do not have cholesterol? Most plant-based foods do not have cholesterol unless you combine them with a food that has cholesterol. Foods without cholesterol include: Grains and cereals. Vegetables. Fruits. Vegetable oils, such as olive, canola, and sunflower oil. Legumes, such as peas, beans, and lentils. Nuts and seeds. Egg whites. The items listed above may not be a complete list of foods that do not have cholesterol. Contact a dietitian for more information. Summary The body needs cholesterol in small amounts, but too much cholesterol can cause damage to the arteries and heart. Cholesterol is found in animal-based foods, such as meat, seafood, and dairy. Generally, low-fat dairy and lean meats have less cholesterol than full-fat dairy and fatty meats. This information is not intended to replace advice given to you by your health care provider. Make sure you discuss any questions you have with your health care provider. Document Revised: 06/08/2020 Document Reviewed: 06/08/2020 Elsevier Patient Education  2022 [place].  PATIENT GOALS/PLAN OF CARE:  Care Plan : RN Care Manager Plan of Care  Updates made by Knox Royalty, RN since 12/20/2020 12:00 AM     Problem: Chronic Disease Management Needs   Priority: High     Long-Range Goal: Development of plan of care for long term chronic disease management   Start Date: 12/20/2020  Expected End Date: 12/20/2021  Priority: High  Note:   Current Barriers:  Chronic Disease Management support and education needs related to HLD and DMII Recent hospitalization October 11-13, 2022 for sepsis/ UTI [facility]- no hearing aids yet; hoping to get soon; reports working with insurance company  RNCM Clinical Goal(s):  Patient will demonstrate Ongoing health management independence DMII; HLD  through collaboration with Consulting civil engineer, provider, and care team.   Interventions: 1:1 collaboration with primary care  provider regarding development and update of comprehensive plan of care as evidenced by provider attestation and co-signature Inter-disciplinary care team collaboration (see longitudinal plan of care) Evaluation of current treatment plan related to  self management and patient's adherence to plan as established by provider  Hyperlipidemia Interventions:  12/20/20: New Goal Medications discussed with patient- reports no concerns/ issues/ problems; daughter assists in preparing medications, patient then takes independently  Provider established cholesterol goals reviewed; Counseled on importance of regular laboratory monitoring as prescribed; Provided HLD educational materials; Reviewed importance of limiting foods high in cholesterol; Assessed social determinant of health barriers;  Discussed importance of following heart  healthy, low salt, low cholesterol diet: essentially, patient reports following regular diet Confirmed patient plans to obtain flu vaccine for 2022-23 flu/ winter season: encouraged to do so promptly  Diabetes Interventions: Assessed patient's understanding of A1c goal: <7% Provided education to patient about basic DM disease process; Counseled on importance of regular laboratory monitoring as prescribed; Discussed plans with patient for ongoing care management follow up and provided patient with direct contact information for care management team; Reviewed scheduled/upcoming provider appointments including: 12/21/20- PCP/ [facility]- by phone; Review of patient status, including review of consultants reports, relevant laboratory and other test results, and medications completed; Reviewed recent blood sugars at home: reports fasting blood sugars consistently between 77-155; monitors blood sugars at home 2-3 times per day, reports "they have been looking fine" since recent hospital discharge; patient monitors at home independently Confirmed patient administers her insulin  independently- reports has been taking 50 U QD/ am: states she has been taking this dose "since last endocrinology appointment;" states her daughter is calling today to make next endocrinology provider appoint (Dr. Loanne Drilling) Assessed patient's understanding of significance of A1-C value: she has a good general understanding of same Reviewed with patient her personal A1-C values over time Discussed/ provided education around dietary management of DMII: encouraged patient to take small steps to reduce sugar/ carb intake: she likes country cooking; drinks sugar free pepsi, encouraged portion control with dietary preferences/ habits Confirmed patient occasionally has symptomatic low blood sugars at home- <80: she drinks OJ, takes a spoonful of peanut butter which "fixes it;" Provided education around signs/ symptoms hypoglycemia along with corresponding action plan Discussed fall history > 3 falls in 12 months: patient reports this "is better," and states she has started using a walker after her last fall and has not fallen since (last fall May 2022) Mailed "Living Well with Diabetes" booklet to patient: encouraged her ongoing review for further discussion  Lab Results  Component Value Date   HGBA1C 7.5 (H) 12/05/2020  Patient Goals/Self-Care Activities: As evidenced by review of EHR, collaboration with care team, and patient reporting during CCM RN CM outreach, [name] will: Self administer medications as prescribed Attend all scheduled provider appointments Call pharmacy for medication refills Call provider office for new concerns or questions Continue to check fasting (first thing in the morning, before eating) blood sugars at home Continue to follow heart healthy, low salt, low cholesterol, carbohydrate-modified, low sugar diet Review enclosed educational material   Follow Up Plan:   Telephone follow up appointment with care management team member scheduled for:  Wednesday, January 30, 2021 at 2:00 pm The patient has been provided with contact information for the care management team and has been advised to call with any health related questions or concerns    Consent to CCM Services: Jocelyn Mendez was given information 12/03/20 about Chronic Care Management services including:  CCM service includes personalized support from designated clinical staff supervised by her physician, including individualized plan of care and coordination with other care providers 24/7 contact phone numbers for assistance for urgent and routine care needs. Service will only be billed when office clinical staff spend 20 minutes or more in a month to coordinate care. Only one practitioner may furnish and bill the service in a calendar month. The patient may stop CCM services at any time (effective at the end of the month) by phone call to the office staff. The patient will be responsible for cost sharing (co-pay) of up to 20% of the service  fee (after annual deductible is met).  Patient agreed to services and verbal consent obtained.   The patient verbalized understanding of instructions, educational materials, and care plan provided today and agreed to receive a mailed copy of patient instructions, educational materials, and care plan Telephone follow up appointment with care management team member scheduled for:  Wednesday January 30, 2021 at 2:00 pm The patient has been provided with contact information for the care management team and has been advised to call with any health related questions or concerns  Jocelyn Rack, RN, BSN, [place] [phone]: direct office [phone]: mobile

## 2020-12-20 NOTE — Chronic Care Management (AMB) (Signed)
Chronic Care Management   CCM RN Visit Note  12/20/2020 Name: Jocelyn Mendez MRN: [id number] DOB: 1930/09/24  Subjective: Jocelyn Mendez is a 87 y.o. year old female who is a primary care patient of Hoyt Koch, MD. The care management team was consulted for assistance with disease management and care coordination needs.    Engaged with patient by telephone for initial visit in response to provider referral for case management and/or care coordination services.   Consent to Services:  The patient was given information about Chronic Care Management services, agreed to services, and gave verbal consent 12/03/20 prior to initiation of services.  Please see initial visit note for detailed documentation.  Patient agreed to services and verbal consent obtained.   Assessment: Review of patient past medical history, allergies, medications, health status, including review of consultants reports, laboratory and other test data, was performed as part of comprehensive evaluation and provision of chronic care management services.   SDOH (Social Determinants of Health) assessments and interventions performed:  SDOH Interventions    Flowsheet Row Most Recent Value  SDOH Interventions   Food Insecurity Interventions Intervention Not Indicated  Housing Interventions Intervention Not Indicated  Transportation Interventions Intervention Not Indicated  [Patient reports son provides transportation]       CCM Care Plan Allergies  Allergen Reactions   Codeine Other (See Comments)    REACTION: change in mental status   Diphenhydramine Hcl Itching   Quinine Other (See Comments)    REACTION: abnormal sensations in face   Sulfonamide Derivatives Rash   Outpatient Encounter Medications as of 12/20/2020  Medication Sig Note   acetaminophen (TYLENOL) 500 MG tablet Take 500 mg by mouth every 6 (six) hours as needed.    albuterol (PROVENTIL HFA;VENTOLIN HFA) 108 (90 BASE) MCG/ACT inhaler Inhale  2 puffs into the lungs every 6 (six) hours as needed for wheezing or shortness of breath.    amLODipine (NORVASC) 5 MG tablet TAKE 1 TABLET BY MOUTH EVERY DAY    ascorbic acid (VITAMIN C) 500 MG tablet Take 1 tablet (500 mg total) by mouth daily.    aspirin 81 MG tablet Take 81 mg by mouth daily.    Blood Glucose Monitoring Suppl (ACCU-CHEK GUIDE ME) w/Device KIT 1 each by Does not apply route 4 (four) times daily. E11.9    busPIRone (BUSPAR) 5 MG tablet TAKE 1 TABLET BY MOUTH TWICE DAILY    Cholecalciferol (VITAMIN D3) 2000 UNITS TABS Take 2,000 Units by mouth 2 (two) times daily.     cyanocobalamin (,VITAMIN B-12,) 1000 MCG/ML injection INJECT ONE ML INTRAMUSCULARLY EVERY MONTH    ferrous sulfate 325 (65 FE) MG EC tablet Take 1 tablet (325 mg total) by mouth daily with breakfast.    gabapentin (NEURONTIN) 100 MG capsule TAKE ONE CAPSULE BY MOUTH THREE TIMES DAILY (Patient taking differently: Take 100 mg by mouth 2 (two) times daily.)    Insulin NPH, Human,, Isophane, (HUMULIN N KWIKPEN) 100 UNIT/ML Kiwkpen Inject 10 Units into the skin every morning. 12/20/2020: 12/20/20- Reports currently taking 50 U Q am   iron polysaccharides (NU-IRON) 150 MG capsule Take 1 capsule (150 mg total) by mouth daily.    Lancet Devices (ADJUSTABLE LANCING DEVICE) MISC Apply 1 each topically daily.    Lancets (ONETOUCH DELICA PLUS [id number]) MISC USE FOUR TIMES DAILY AS DIRECTED    latanoprost (XALATAN) 0.005 % ophthalmic solution Place 1 drop into both eyes at bedtime.    levothyroxine (SYNTHROID) 137 MCG tablet TAKE 1  TABLET BY MOUTH DAILY BEFORE breakfast    lidocaine (LIDODERM) 5 % APPLY ONE PATCH TO SKIN EVERY DAY. REMOVE AND DISCARD PATCH WITHIN 12 HOURS OR AS DIRECTED BY MD - (Patient taking differently: Place 1 patch onto the skin daily.) 11/12/2018: Needs refilled   loperamide (IMODIUM A-D) 2 MG tablet Take 2 mg by mouth 4 (four) times daily as needed for diarrhea or loose stools.    NEEDLE, DISP, 25 G (BD  DISP NEEDLES) 25G X 5/8" MISC Use as directed to administer  the b12 injection monthly    ONETOUCH VERIO test strip USE TO TEST FOUR TIMES DAILY    rosuvastatin (CRESTOR) 20 MG tablet TAKE 1 TABLET BY MOUTH DAILY    saccharomyces boulardii (FLORASTOR) 250 MG capsule Take 1 capsule (250 mg total) by mouth 2 (two) times daily.    zinc sulfate 220 (50 Zn) MG capsule Take 1 capsule (220 mg total) by mouth daily.    No facility-administered encounter medications on file as of 12/20/2020.   Patient Active Problem List   Diagnosis Date Noted   Urinary tract infection without hematuria 12/05/2020   Iron deficiency 12/05/2020   Sepsis secondary to UTI ([place]) 11/20/2020   COVID-19 virus infection 11/20/2020   Hyperglycemia due to diabetes mellitus ([place]) 11/20/2020   Elevated AST (SGOT) 11/20/2020   Urge incontinence 11/20/2020   Urinary incontinence 09/22/2019   Routine general medical examination at a health care facility 03/05/2019   Left breast lump 12/08/2017   Allergic rhinitis 12/08/2017   Asthma 08/27/2016   Diabetes ([place]) 03/28/2015   Gait abnormality 05/13/2010   B12 deficiency 11/23/2007   Anemia, chronic disease 01/21/2007   CKD (chronic kidney disease) stage 4, GFR 15-29 ml/min (HCC) 01/21/2007   Hypothyroidism 11/21/2006   Mixed hyperlipidemia 11/21/2006   Essential hypertension 11/21/2006   GERD 11/21/2006   DEGENERATIVE JOINT DISEASE, GENERALIZED 11/21/2006   Chronic bilateral low back pain without sciatica 11/21/2006   Osteoporosis 11/21/2006   Conditions to be addressed/monitored:  HLD and DMII  Care Plan : RN Care Manager Plan of Care  Updates made by Knox Royalty, RN since 12/20/2020 12:00 AM     Problem: Chronic Disease Management Needs   Priority: High     Long-Range Goal: Development of plan of care for long term chronic disease management   Start Date: 12/20/2020  Expected End Date: 12/20/2021  Priority: High  Note:   Current Barriers:  Chronic  Disease Management support and education needs related to HLD and DMII Recent hospitalization October 11-13, 2022 for sepsis/ UTI [facility]- no hearing aids yet; hoping to get soon; reports working with insurance company  RNCM Clinical Goal(s):  Patient will demonstrate Ongoing health management independence DMII; HLD  through collaboration with Consulting civil engineer, provider, and care team.   Interventions: 1:1 collaboration with primary care provider regarding development and update of comprehensive plan of care as evidenced by provider attestation and co-signature Inter-disciplinary care team collaboration (see longitudinal plan of care) Evaluation of current treatment plan related to  self management and patient's adherence to plan as established by provider  Hyperlipidemia Interventions:  12/20/20: New Goal Medications discussed with patient- reports no concerns/ issues/ problems; daughter assists in preparing medications, patient then takes independently  Provider established cholesterol goals reviewed; Counseled on importance of regular laboratory monitoring as prescribed; Provided HLD educational materials; Reviewed importance of limiting foods high in cholesterol; Assessed social determinant of health barriers;  Discussed importance of following heart healthy, low salt, low  cholesterol diet: essentially, patient reports following regular diet Confirmed patient plans to obtain flu vaccine for 2022-23 flu/ winter season: encouraged to do so promptly  Diabetes Interventions: Assessed patient's understanding of A1c goal: <7% Provided education to patient about basic DM disease process; Counseled on importance of regular laboratory monitoring as prescribed; Discussed plans with patient for ongoing care management follow up and provided patient with direct contact information for care management team; Reviewed scheduled/upcoming provider appointments including: 12/21/20- PCP/ [facility]- by  phone; Review of patient status, including review of consultants reports, relevant laboratory and other test results, and medications completed; Reviewed recent blood sugars at home: reports fasting blood sugars consistently between 77-155; monitors blood sugars at home 2-3 times per day, reports "they have been looking fine" since recent hospital discharge; patient monitors at home independently Confirmed patient administers her insulin independently- reports has been taking 50 U QD/ am: states she has been taking this dose "since last endocrinology appointment;" states her daughter is calling today to make next endocrinology provider appoint (Dr. Loanne Drilling) Assessed patient's understanding of significance of A1-C value: she has a good general understanding of same Reviewed with patient her personal A1-C values over time Discussed/ provided education around dietary management of DMII: encouraged patient to take small steps to reduce sugar/ carb intake: she likes country cooking; drinks sugar free pepsi, encouraged portion control with dietary preferences/ habits Confirmed patient occasionally has symptomatic low blood sugars at home- <80: she drinks OJ, takes a spoonful of peanut butter which "fixes it;" Provided education around signs/ symptoms hypoglycemia along with corresponding action plan Discussed fall history > 3 falls in 12 months: patient reports this "is better," and states she has started using a walker after her last fall and has not fallen since (last fall May 2022) Mailed "Living Well with Diabetes" booklet to patient: encouraged her ongoing review for further discussion  Lab Results  Component Value Date   HGBA1C 7.5 (H) 12/05/2020  Patient Goals/Self-Care Activities: As evidenced by review of EHR, collaboration with care team, and patient reporting during CCM RN CM outreach, Patient [place] will: Self administer medications as prescribed Attend all scheduled provider  appointments Call pharmacy for medication refills Call provider office for new concerns or questions Continue to check fasting (first thing in the morning, before eating) blood sugars at home Continue to follow heart healthy, low salt, low cholesterol, carbohydrate-modified, low sugar diet Review enclosed educational material   Follow Up Plan:   Telephone follow up appointment with care management team member scheduled for:  Wednesday, January 30, 2021 at 2:00 pm The patient has been provided with contact information for the care management team and has been advised to call with any health related questions or concerns    Plan: The patient has been provided with contact information for the care management team and has been advised to call with any health related questions or concerns  Oneta Rack, RN, BSN, [place] [phone]: direct office [phone]: mobile

## 2020-12-21 ENCOUNTER — Ambulatory Visit: Payer: Medicare Other

## 2020-12-26 ENCOUNTER — Other Ambulatory Visit: Payer: Self-pay | Admitting: Internal Medicine

## 2021-01-22 ENCOUNTER — Ambulatory Visit (INDEPENDENT_AMBULATORY_CARE_PROVIDER_SITE_OTHER): Payer: Medicare Other | Admitting: Endocrinology

## 2021-01-22 ENCOUNTER — Other Ambulatory Visit: Payer: Self-pay

## 2021-01-22 DIAGNOSIS — N1832 Chronic kidney disease, stage 3b: Secondary | ICD-10-CM | POA: Diagnosis not present

## 2021-01-22 DIAGNOSIS — E1122 Type 2 diabetes mellitus with diabetic chronic kidney disease: Secondary | ICD-10-CM

## 2021-01-22 MED ORDER — ONETOUCH VERIO VI STRP: 1.0000 | ORAL_STRIP | Freq: Two times a day (BID) | 3 refills | Status: DC

## 2021-01-22 NOTE — Patient Instructions (Addendum)
check your blood sugar twice a day.  vary the time of day when you check, between before the 3 meals, and at bedtime.  also check if you have symptoms of your blood sugar being too high or too low.  please keep a record of the readings and bring it to your next appointment here.  You can write it on any piece of paper.  please call us sooner if your blood sugar goes below 70, or if you have a lot of readings over 200.   On this type of insulin schedule, you should eat meals on a regular schedule.  If a meal is missed or significantly delayed, your blood sugar could go low.   Blood tests are requested for you today.  We'll let you know about the results.  Please come back for a follow-up appointment in 2 months.

## 2021-01-22 NOTE — Progress Notes (Signed)
Subjective:    Patient ID: Jocelyn Mendez, female    DOB: 1930-11-17, 87 y.o.   MRN: [id number]  HPI Pt returns for f/u of diabetes mellitus:  DM type: Insulin-requiring type 2.  Dx'ed: 1988.   Complications: PN and stage 4 CRI.   Therapy: insulin since 2012.  GDM: never.  DKA: never Severe hypoglycemia: never.   Pancreatitis: never.   SDOH: She takes her own insulin, and checks her own cbg.   Other: she declines multiple daily injections; she uses pen, due to visual problems; on levemir, she had am hypoglycemia and pm hyperglycemia, so she was changed to QAM NPH; fructosamine converts to A1c much higher than A1c itself.   Interval history:  pt states she feels well in general.  she brings his meter with his cbg's which I have reviewed today.  Cbg varies from 21-224, but she is uncertain as to the accuracy of her meter.  It is in general higher as the day goes on.  She says she never misses the insulin.  Past Medical History:  Diagnosis Date   Acute bronchitis    Anemia of other chronic disease    Anxiety state, unspecified    Chest pain, unspecified    Diverticulosis of colon (without mention of hemorrhage)    Esophageal reflux    Gallstones    Generalized osteoarthrosis, unspecified site    Headache(784.0)    Lumbago    Neuropathy in diabetes ([place])    bilat LE's   Osteoporosis    Other and unspecified hyperlipidemia    Other B-complex deficiencies    Type II or unspecified type diabetes mellitus without mention of complication, not stated as uncontrolled    Unspecified disorder resulting from impaired renal function    Unspecified essential hypertension    Unspecified hypothyroidism     Past Surgical History:  Procedure Laterality Date   APPENDECTOMY     THYROIDECTOMY      Social History   Socioeconomic History   Marital status: Divorced    Spouse name: Not on file   Number of children: 6   Years of education: Not on file   Highest education level: Not on  file  Occupational History   Occupation: retired  Tobacco Use   Smoking status: Former    Packs/day: 0.50    Years: 6.00    Pack years: 3.00    Types: Cigarettes    Quit date: 02/10/1973    Years since quitting: 47.9   Smokeless tobacco: Former    Types: Snuff    Quit date: 04/07/2010  Vaping Use   Vaping Use: Never used  Substance and Sexual Activity   Alcohol use: No    Alcohol/week: 0.0 standard drinks   Drug use: No   Sexual activity: Never  Other Topics Concern   Not on file  Social History Narrative   1 child passed away at 55 weeks old---?crib death   Dtr; [name]; Son lives with her [name]; [name]; roger;    Social Determinants of Health   Financial Resource Strain: Not on file  Food Insecurity: No Food Insecurity   Worried About Charity fundraiser in the Last Year: Never true   Arboriculturist in the Last Year: Never true  Transportation Needs: No Transportation Needs   Lack of Transportation (Medical): No   Lack of Transportation (Non-Medical): No  Physical Activity: Not on file  Stress: Not on file  Social Connections: Not  on file  Intimate Partner Violence: Not on file    Current Outpatient Medications on File Prior to Visit  Medication Sig Dispense Refill   acetaminophen (TYLENOL) 500 MG tablet Take 500 mg by mouth every 6 (six) hours as needed.     albuterol (PROVENTIL HFA;VENTOLIN HFA) 108 (90 BASE) MCG/ACT inhaler Inhale 2 puffs into the lungs every 6 (six) hours as needed for wheezing or shortness of breath. 1 Inhaler 6   amLODipine (NORVASC) 5 MG tablet TAKE 1 TABLET BY MOUTH EVERY DAY 90 tablet 1   ascorbic acid (VITAMIN C) 500 MG tablet Take 1 tablet (500 mg total) by mouth daily. 30 tablet 0   aspirin 81 MG tablet Take 81 mg by mouth daily.     Blood Glucose Monitoring Suppl (ACCU-CHEK GUIDE ME) w/Device KIT 1 each by Does not apply route 4 (four) times daily. E11.9 1 kit 0   busPIRone (BUSPAR) 5 MG tablet TAKE 1 TABLET BY MOUTH TWICE  DAILY 60 tablet 0   Cholecalciferol (VITAMIN D3) 2000 UNITS TABS Take 2,000 Units by mouth 2 (two) times daily.      cyanocobalamin (,VITAMIN B-12,) 1000 MCG/ML injection INJECT ONE ML INTRAMUSCULARLY EVERY MONTH 1 mL 1   fenofibrate 160 MG tablet TAKE 1 TABLET BY MOUTH DAILY 30 tablet 0   ferrous sulfate 325 (65 FE) MG EC tablet Take 1 tablet (325 mg total) by mouth daily with breakfast.  3   gabapentin (NEURONTIN) 100 MG capsule TAKE ONE CAPSULE BY MOUTH THREE TIMES DAILY 90 capsule 0   Insulin NPH, Human,, Isophane, (HUMULIN N KWIKPEN) 100 UNIT/ML Kiwkpen Inject 10 Units into the skin every morning. 60 mL 3   iron polysaccharides (NU-IRON) 150 MG capsule Take 1 capsule (150 mg total) by mouth daily. 90 capsule 1   Lancet Devices (ADJUSTABLE LANCING DEVICE) MISC Apply 1 each topically daily.     latanoprost (XALATAN) 0.005 % ophthalmic solution Place 1 drop into both eyes at bedtime.  11   levothyroxine (SYNTHROID) 137 MCG tablet TAKE 1 TABLET BY MOUTH DAILY BEFORE breakfast 90 tablet 3   lidocaine (LIDODERM) 5 % APPLY ONE PATCH TO SKIN EVERY DAY. REMOVE AND DISCARD PATCH WITHIN 12 HOURS OR AS DIRECTED BY MD - (Patient taking differently: Place 1 patch onto the skin daily.) 30 patch 0   loperamide (IMODIUM A-D) 2 MG tablet Take 2 mg by mouth 4 (four) times daily as needed for diarrhea or loose stools.     NEEDLE, DISP, 25 G (BD DISP NEEDLES) 25G X 5/8" MISC Use as directed to administer  the b12 injection monthly 50 each 2   rosuvastatin (CRESTOR) 20 MG tablet TAKE 1 TABLET BY MOUTH DAILY 90 tablet 3   saccharomyces boulardii (FLORASTOR) 250 MG capsule Take 1 capsule (250 mg total) by mouth 2 (two) times daily. 60 capsule 0   zinc sulfate 220 (50 Zn) MG capsule Take 1 capsule (220 mg total) by mouth daily. 30 capsule 0   No current facility-administered medications on file prior to visit.    Allergies  Allergen Reactions   Codeine Other (See Comments)    REACTION: change in mental status    Diphenhydramine Hcl Itching   Quinine Other (See Comments)    REACTION: abnormal sensations in face   Sulfonamide Derivatives Rash    Family History  Problem Relation Age of Onset   Diabetes Mother    Cancer Father        unsure ? stomach  Heart attack Brother        x 3   Cancer Sister        unsure type   Lung cancer Brother        smoker   Heart attack Sister     BP (!) 126/50 (BP Location: Left Arm, Patient Position: Sitting, Cuff Size: Normal)    Pulse 88    Ht '5\' 6"'  (1.676 m)    Wt 153 lb (69.4 kg)    SpO2 98%    BMI 24.69 kg/m    Review of Systems     Objective:   Physical Exam     Pt's meter CBG=98 Lab=82 Lab Results  Component Value Date   HGBA1C 7.5 (H) 12/05/2020      Assessment & Plan:  Insulin-requiring type 2 DM: uncontrolled.  However, there is a good correlation between lab and pt's meter. Check fructosamine.  Pending that result, Please continue the same insulin.  Patient Instructions  check your blood sugar twice a day.  vary the time of day when you check, between before the 3 meals, and at bedtime.  also check if you have symptoms of your blood sugar being too high or too low.  please keep a record of the readings and bring it to your next appointment here.  You can write it on any piece of paper.  please call us sooner if your blood sugar goes below 70, or if you have a lot of readings over 200.   On this type of insulin schedule, you should eat meals on a regular schedule.  If a meal is missed or significantly delayed, your blood sugar could go low.   Blood tests are requested for you today.  We'll let you know about the results.  Please come back for a follow-up appointment in 2 months.

## 2021-01-23 LAB — BASIC METABOLIC PANEL
BUN: 41 mg/dL — ABNORMAL HIGH (ref 6–23)
CO2: 24 mEq/L (ref 19–32)
Calcium: 9.9 mg/dL (ref 8.4–10.5)
Chloride: 107 mEq/L (ref 96–112)
Creatinine, Ser: 1.9 mg/dL — ABNORMAL HIGH (ref 0.40–1.20)
GFR: 23.35 mL/min — ABNORMAL LOW (ref >60.00)
Glucose, Bld: 82 mg/dL (ref 70–99)
Potassium: 4.5 mEq/L (ref 3.5–5.1)
Sodium: 140 mEq/L (ref 135–145)

## 2021-01-27 LAB — FRUCTOSAMINE: Fructosamine: 350 umol/L — ABNORMAL HIGH (ref 205–285)

## 2021-01-29 ENCOUNTER — Other Ambulatory Visit: Payer: Self-pay | Admitting: Internal Medicine

## 2021-01-30 ENCOUNTER — Ambulatory Visit (INDEPENDENT_AMBULATORY_CARE_PROVIDER_SITE_OTHER): Payer: Medicare Other | Admitting: *Deleted

## 2021-01-30 DIAGNOSIS — N1831 Chronic kidney disease, stage 3a: Secondary | ICD-10-CM

## 2021-01-30 DIAGNOSIS — I10 Essential (primary) hypertension: Secondary | ICD-10-CM

## 2021-01-30 DIAGNOSIS — E785 Hyperlipidemia, unspecified: Secondary | ICD-10-CM

## 2021-01-30 NOTE — Patient Instructions (Signed)
Visit [place], thank you for taking time to talk with me today. Please don't hesitate to contact me if I can be of assistance to you before our next scheduled telephone appointment.  Below are the goals we discussed today:  Patient Self-Care Activities: [name] will: Take medications as prescribed Attend all scheduled provider appointments Call pharmacy for medication refills Call provider office for new concerns or questions Continue to check fasting (first thing in the morning, before eating) blood sugars at home; if your blood sugars continue to be low on a regular basis and make you fell bad- please reach out to Dr. Cordelia Pen team to report this Continue trying to follow heart healthy, low salt, low cholesterol, carbohydrate-modified, low sugar diet Continue your efforts to prevent falls- continue using your walker Review enclosed educational material about what to do if your blood sugar gets too low, and how to prevent falls  Our next scheduled telephone follow up visit/ appointment with care management team member is scheduled on:  Monday, April 01, 2021 at 3:00 pm  If you need to cancel or re-schedule our visit, please call [phone] and our care guide team will be happy to assist you.   I look forward to hearing about your progress.   Jocelyn Rack, RN, BSN, [place] [phone]: direct office  If you are experiencing a Mental Health or [place] or need someone to talk to, please  call the Suicide and Crisis Lifeline: 988 call the [place] National Suicide Prevention Lifeline: 517-102-3820 or TTY: 204-693-9502 TTY (938)841-7875) to talk to a trained counselor call 1-800-273-TALK (toll free, 24 hour hotline) go to [facility] [address] [place] [phone]) call 911   The patient verbalized understanding of instructions,  educational materials, and care plan provided today and agreed to receive a mailed copy of patient instructions, educational materials, and care plan  Preventing Hypoglycemia Hypoglycemia occurs when the level of sugar (glucose) in the blood is too low. Hypoglycemia can happen in people who do or do not have diabetes (diabetes mellitus). It can develop quickly, and it can be a medical emergency. For most people with diabetes, a blood glucose level below 70 mg/dL (3.9 mmol/L) is considered hypoglycemia. Glucose is a type of sugar that provides the body's main source of energy. Certain hormones (insulin and glucagon) control the level of glucose in the blood. Insulin lowers blood glucose, and glucagon increases blood glucose. Hypoglycemia can result from having too much insulin in the bloodstream, or from not eating enough food that contains glucose. Your risk for hypoglycemia is higher: If you take insulin or diabetes medicines to help lower your blood glucose or to help your body make more insulin. If you skip or delay a meal or snack. If you are ill. During and after exercise. You can prevent hypoglycemia by working with your health care provider to adjust your meal plan as needed and by taking other precautions. How can hypoglycemia affect me? Mild symptoms Mild hypoglycemia may not cause any symptoms. If you do have symptoms, they may include: Hunger. Sweating and feeling clammy. Dizziness or feeling light-headed. Sleepiness or restless sleep. Nausea. Increased heart rate. Headache. Blurry vision. Mood changes, including irritability or anxiety. Tingling or numbness around the mouth, lips, or tongue. If mild hypoglycemia is not recognized and treated, it can quickly become moderate or severe hypoglycemia. Moderate symptoms Moderate hypoglycemia can cause: Confusion and poor  judgment. Behavior changes. Weakness. Irregular heartbeat. A change in coordination. Severe symptoms Severe  hypoglycemia is a medical emergency. It can cause: Fainting. Seizures. Loss of consciousness (coma). Death. What nutrition changes can be made? Work with your health care provider or dietitian to make a healthy meal plan that is right for you. Follow your meal plan carefully. Eat meals at regular times. If recommended by your health care provider, have snacks between meals. Donot skip or delay meals or snacks. You can be at risk for hypoglycemia if you are not getting enough carbohydrates. What lifestyle changes can be made?  Work closely with your health care provider to manage your blood glucose. Make sure you know: Your goal blood glucose levels. How and when to check your blood glucose. The symptoms of hypoglycemia. It is important to treat hypoglycemia right away to keep it from becoming severe. Do not drink alcohol on an empty stomach. When you are ill, check your blood glucose more often than usual. Make a sick day plan in advance with your health care provider. Follow this plan whenever you cannot eat or drink normally. Always check your blood glucose before, during, and after exercise. How is this treated? This condition can often be treated by immediately eating or drinking something that contains sugar with 15 grams of fast-acting carbohydrate, such as: 4 oz (120 mL) of fruit juice. 4 oz (120 mL) of regular soda (not diet soda). Several pieces of hard candy. Check food labels to find out how many pieces to eat for 15 grams. 1 Tbsp (15 mL) of sugar or honey. 4 glucose tablets. 1 tube of glucose gel. Treating hypoglycemia if you have diabetes If you are alert and able to swallow safely, follow the 15:15 rule: Take 15 grams of a fast-acting carbohydrate. Talk with your health care provider about how much you should take. Fast-acting options include: Glucose tablets (take 4 tablets). Several pieces of hard candy. Check food labels to find out how many pieces to eat for 15  grams. 4 oz (120 mL) of fruit juice. 4 oz (120 mL) of regular soda (not diet soda). 1 Tbsp (15 mL) of sugar or honey. 1 tube of glucose gel. Check your blood glucose 15 minutes after you take the carbohydrate. If the repeat blood glucose level is still at or below 70 mg/dL (3.9 mmol/L), take 15 grams of a carbohydrate again. If your blood glucose level does not increase above 70 mg/dL (3.9 mmol/L) after 3 tries, seek emergency medical care. After your blood glucose level returns to normal, eat a meal or a snack within 1 hour. Treating severe hypoglycemia Severe hypoglycemia is when your blood glucose level is below 54 mg/dL (3 mmol/L). Severe hypoglycemia is a medical emergency. Get medical help right away. If you have severe hypoglycemia and you cannot eat or drink, you may need glucagon. A family member or close friend should learn how to check your blood glucose and how to give you glucagon. Ask your health care provider if you need to have an emergency glucagon kit available. Severe hypoglycemia may need to be treated in a hospital. The treatment may include getting glucose through an IV. You may also need treatment for the cause of your hypoglycemia. Where to find more information American Diabetes Association: www.diabetes.[employer] of Diabetes and Digestive and Kidney Diseases: [place] Association of Diabetes Care & Education Specialists: www.diabeteseducator.org Contact a health care provider if: You have problems keeping your blood glucose in your target range. You  have frequent episodes of hypoglycemia. Get help right away if: You continue to have hypoglycemia symptoms after eating or drinking something containing glucose. Your blood glucose level is below 54 mg/dL (3 mmol/L). You faint. You have a seizure. These symptoms may represent a serious problem that is an emergency. Do not wait to see if the symptoms will go away. Get medical help right away. Call  your local emergency services (911 in the [place] Do not drive yourself to the hospital. Summary Know the symptoms of hypoglycemia and when you are at risk for it, such as during exercise or when you are sick. Check your blood glucose often when you are at risk for hypoglycemia. Hypoglycemia can develop quickly, and it can be dangerous if it is not treated right away. If you have a history of severe hypoglycemia, make sure your family or a close friend knows how to use your glucagon kit. Make sure you know how to treat hypoglycemia. Keep a fast-acting carbohydrate option available when you may be at risk for hypoglycemia. This information is not intended to replace advice given to you by your health care provider. Make sure you discuss any questions you have with your health care provider. Document Revised: 12/29/2019 Document Reviewed: 12/29/2019 Elsevier Patient Education  2022 [place] for Falls Each year, millions of people have serious injuries from falls. It is important to understand your risk for falling. Talk with your health care provider about your risk and what you can do to lower it. There are actions you can take at home to lower your risk and prevent falls. If you do have a serious fall, make sure to tell your health care provider. Falling once raises your risk of falling again. How can falls affect me? Serious injuries from falls are common. These include: Broken bones, such as hip fractures. Head injuries, such as traumatic brain injuries (TBI) or concussion. A fear of falling can cause you to avoid activities and stay at home. This can make your muscles weaker and actually raise your risk for a fall. What can increase my risk? There are a number of risk factors that increase your risk for falling. The more risk factors you have, the higher your risk of falling. Serious injuries from a fall happen most often to people older than age 61. Children and  young adults ages 66-29 are also at higher risk. Common risk factors include: Weakness in the lower body. Lack (deficiency) of vitamin D. Being generally weak or confused due to long-term (chronic) illness. Dizziness or balance problems. Poor vision. Medicines that cause dizziness or drowsiness. These can include medicines for your blood pressure, heart, anxiety, insomnia, or edema, as well as pain medicines and muscle relaxants. Other risk factors include: Drinking alcohol. Having had a fall in the past. Having depression. Having foot pain or wearing improper footwear. Working at a dangerous job. Having any of the following in your home: Tripping hazards, such as floor clutter or loose rugs. Poor lighting. Pets. Having dementia or memory loss. What actions can I take to lower my risk of falling?   Physical activity Maintain physical fitness. Do strength and balance exercises. Consider taking a regular class to build strength and balance. Yoga and tai chi are good options. Vision Have your eyes checked every year and your vision prescription updated as needed. Walking aids and footwear Wear nonskid shoes. Do not wear high heels. Do not walk around the house in socks or slippers.  Use a cane or walker as told by your health care provider. Home safety Attach secure railings on both sides of your stairs. Install grab bars for your tub, shower, and toilet. Use a bath mat in your tub or shower. Use good lighting in all rooms. Keep a flashlight near your bed. Make sure there is a clear path from your bed to the bathroom. Use night-lights. Do not use throw rugs. Make sure all carpeting is taped or tacked down securely. Remove all clutter from walkways and stairways, including extension cords. Repair uneven or broken steps. Avoid walking on icy or slippery surfaces. Walk on the grass instead of on icy or slick sidewalks. Use ice melt to get rid of ice on walkways. Use a cordless  phone. Questions to ask your health care provider Can you help me check my risk for a fall? Do any of my medicines make me more likely to fall? Should I take a vitamin D supplement? What exercises can I do to improve my strength and balance? Should I make an appointment to have my vision checked? Do I need a bone density test to check for weak bones or osteoporosis? Would it help to use a cane or a walker? Where to find more information Centers for Disease Control and Prevention, [employer]: http://www.wolf.info/ Community-Based Fall Prevention Programs: http://www.wolf.info/ National Institute on Aging: http://kim-miller.com/ Contact a health care provider if: You fall at home. You are afraid of falling at home. You feel weak, drowsy, or dizzy. Summary Serious injuries from a fall happen most often to people older than age 15. Children and young adults ages 47-29 are also at higher risk. Talk with your health care provider about your risks for falling and how to lower those risks. Taking certain precautions at home can lower your risk for falling. If you fall, always tell your health care provider. This information is not intended to replace advice given to you by your health care provider. Make sure you discuss any questions you have with your health care provider. Document Revised: 08/31/2019 Document Reviewed: 08/31/2019 Elsevier Patient Education  [place].

## 2021-01-30 NOTE — Chronic Care Management (AMB) (Signed)
Chronic Care Management   CCM RN Visit Note  01/30/2021 Name: Jocelyn Mendez MRN: [id number] DOB: 1930-08-28  Subjective: Jocelyn Mendez is a 87 y.o. year old female who is a primary care patient of Hoyt Koch, MD. The care management team was consulted for assistance with disease management and care coordination needs.    Engaged with patient by telephone for follow up visit in response to provider referral for case management and/or care coordination services.   Consent to Services:  The patient was given information about Chronic Care Management services, agreed to services, and gave verbal consent prior to initiation of services.  Please see initial visit note for detailed documentation.  Patient agreed to services and verbal consent obtained.   Assessment: Review of patient past medical history, allergies, medications, health status, including review of consultants reports, laboratory and other test data, was performed as part of comprehensive evaluation and provision of chronic care management services.   CCM Care Plan Allergies  Allergen Reactions   Codeine Other (See Comments)    REACTION: change in mental status   Diphenhydramine Hcl Itching   Quinine Other (See Comments)    REACTION: abnormal sensations in face   Sulfonamide Derivatives Rash   Outpatient Encounter Medications as of 01/30/2021  Medication Sig Note   acetaminophen (TYLENOL) 500 MG tablet Take 500 mg by mouth every 6 (six) hours as needed.    albuterol (PROVENTIL HFA;VENTOLIN HFA) 108 (90 BASE) MCG/ACT inhaler Inhale 2 puffs into the lungs every 6 (six) hours as needed for wheezing or shortness of breath.    amLODipine (NORVASC) 5 MG tablet TAKE 1 TABLET BY MOUTH EVERY DAY    ascorbic acid (VITAMIN C) 500 MG tablet Take 1 tablet (500 mg total) by mouth daily.    aspirin 81 MG tablet Take 81 mg by mouth daily.    Blood Glucose Monitoring Suppl (ACCU-CHEK GUIDE ME) w/Device KIT 1 each by Does not  apply route 4 (four) times daily. E11.9    busPIRone (BUSPAR) 5 MG tablet TAKE 1 TABLET BY MOUTH TWICE DAILY    Cholecalciferol (VITAMIN D3) 2000 UNITS TABS Take 2,000 Units by mouth 2 (two) times daily.     cyanocobalamin (,VITAMIN B-12,) 1000 MCG/ML injection INJECT ONE ML INTRAMUSCULARLY EVERY MONTH    fenofibrate 160 MG tablet TAKE 1 TABLET BY MOUTH DAILY    ferrous sulfate 325 (65 FE) MG EC tablet Take 1 tablet (325 mg total) by mouth daily with breakfast.    gabapentin (NEURONTIN) 100 MG capsule TAKE ONE CAPSULE BY MOUTH THREE TIMES DAILY    glucose blood (ONETOUCH VERIO) test strip 1 each by Other route 2 (two) times daily. And lancets 2/day    Insulin NPH, Human,, Isophane, (HUMULIN N KWIKPEN) 100 UNIT/ML Kiwkpen Inject 10 Units into the skin every morning. 12/20/2020: 12/20/20- Reports currently taking 50 U Q am   iron polysaccharides (NU-IRON) 150 MG capsule Take 1 capsule (150 mg total) by mouth daily.    Lancet Devices (ADJUSTABLE LANCING DEVICE) MISC Apply 1 each topically daily.    latanoprost (XALATAN) 0.005 % ophthalmic solution Place 1 drop into both eyes at bedtime.    levothyroxine (SYNTHROID) 137 MCG tablet TAKE 1 TABLET BY MOUTH DAILY BEFORE breakfast    lidocaine (LIDODERM) 5 % APPLY ONE PATCH TO SKIN EVERY DAY. REMOVE AND DISCARD PATCH WITHIN 12 HOURS OR AS DIRECTED BY MD - (Patient taking differently: Place 1 patch onto the skin daily.) 11/12/2018: Needs refilled  loperamide (IMODIUM A-D) 2 MG tablet Take 2 mg by mouth 4 (four) times daily as needed for diarrhea or loose stools.    NEEDLE, DISP, 25 G (BD DISP NEEDLES) 25G X 5/8" MISC Use as directed to administer  the b12 injection monthly    rosuvastatin (CRESTOR) 20 MG tablet TAKE 1 TABLET BY MOUTH DAILY    saccharomyces boulardii (FLORASTOR) 250 MG capsule Take 1 capsule (250 mg total) by mouth 2 (two) times daily.    zinc sulfate 220 (50 Zn) MG capsule Take 1 capsule (220 mg total) by mouth daily.    No  facility-administered encounter medications on file as of 01/30/2021.   Patient Active Problem List   Diagnosis Date Noted   Urinary tract infection without hematuria 12/05/2020   Iron deficiency 12/05/2020   Sepsis secondary to UTI ([place]) 11/20/2020   COVID-19 virus infection 11/20/2020   Hyperglycemia due to diabetes mellitus ([place]) 11/20/2020   Elevated AST (SGOT) 11/20/2020   Urge incontinence 11/20/2020   Urinary incontinence 09/22/2019   Routine general medical examination at a health care facility 03/05/2019   Left breast lump 12/08/2017   Allergic rhinitis 12/08/2017   Asthma 08/27/2016   Diabetes ([place]) 03/28/2015   Gait abnormality 05/13/2010   B12 deficiency 11/23/2007   Anemia, chronic disease 01/21/2007   CKD (chronic kidney disease) stage 4, GFR 15-29 ml/min ([place]) 01/21/2007   Hypothyroidism 11/21/2006   Mixed hyperlipidemia 11/21/2006   Essential hypertension 11/21/2006   GERD 11/21/2006   DEGENERATIVE JOINT DISEASE, GENERALIZED 11/21/2006   Chronic bilateral low back pain without sciatica 11/21/2006   Osteoporosis 11/21/2006   Conditions to be addressed/monitored:  HLD and DMII  Care Plan : RN Care Manager Plan of Care  Updates made by Knox Royalty, RN since 01/30/2021 12:00 AM     Problem: Chronic Disease Management Needs   Priority: High     Long-Range Goal: Development of plan of care for long term chronic disease management   Start Date: 12/20/2020  Expected End Date: 12/20/2021  Priority: High  Note:   Current Barriers:  Chronic Disease Management support and education needs related to HLD and DMII Recent hospitalization October 11-13, 2022 for sepsis/ UTI [facility]- no hearing aids yet; hoping to get soon; reports working with insurance company Multiple falls without serious injury: High Fall Risk; uses walker regularly; frequent episodes hypoglycemia; reports last fall as "May of 2022"  RNCM Clinical Goal(s):  Patient will demonstrate Ongoing health  management independence DMII; HLD  through collaboration with RN Care manager, provider, and care team.   Interventions: 1:1 collaboration with primary care provider regarding development and update of comprehensive plan of care as evidenced by provider attestation and co-signature Inter-disciplinary care team collaboration (see longitudinal plan of care) Evaluation of current treatment plan related to  self management and patient's adherence to plan as established by provider  Hyperlipidemia Interventions:  Goal Status: on track 01/30/21 Medications discussed with patient- reports no concerns/ issues/ problems; daughter assists in preparing medications, patient then takes independently  Counseled on importance of regular laboratory monitoring as prescribed Reviewed importance of limiting foods high in cholesterol Reinforced previously provided education of importance of following heart healthy, low salt, low cholesterol diet: essentially, patient reports following regular diet but states today that she is :trying to eat better" Confirmed patient obtained flu vaccine for 2022-23 flu/ winter season: positive reinforcement provided  Diabetes Interventions:  Goal Status: on track 01/30/21 Provided education to patient about basic DM disease process Counseled  on importance of regular laboratory monitoring as prescribed Discussed plans with patient for ongoing care management follow up and provided patient with direct contact information for care management team Reviewed scheduled/upcoming provider appointments including: 12/21/20- PCP/ [facility]- by phone Review of patient status, including review of consultants reports, relevant laboratory and other test results, and medications completed Reviewed recent blood sugars at home: reports fasting blood sugars consistently between 64- 240; monitors blood sugars at home 2-3 times per day, reports "they have been high lately;" unfortunately, patient is not clear  about whether the blood sugar values she reports today are fasting or post-prandial; she does not write blood sugars down on paper and "looks at the meter" for our review; this is confusing to both she and I-- however; today she reports vast ranges of blood sugars at home; with a low of "60" and a high "into the 300's" Discussed low blood sugar values: reports "this happens about every week;" states blood sugars "go as low as 60;" and confirms that she is symptomatic: reviewed action plan for hypoglycemia: patient eats peanut butter/ candy/ or drinks OJ, which generally corrects lows promptly Reviewed recent endocrinology provider office visit 01/22/21: reports "went okay;" denies changes to current insulin regimen: continues taking Humulin NPH 50 U QD- in morning; awaiting follow up from Dr. Cordelia Pen office staff regarding lab results Reinforced previously provided education around dietary management of DMII: encouraged patient to take small steps to reduce sugar/ carb intake: she likes country cooking; drinks sugar free pepsi, encouraged portion control with dietary preferences/ habits; today, she reports she has "tried" to decrease her intake of soda "by half;" positive reinforcement provided with encouragement to continue efforts Confirmed patient continues to experience ongoing intermittent symptomatic low blood sugars at home- <70: she drinks OJ, takes a spoonful of peanut butter which "usually fixes it;" reinforced previously provided education around action plan for hypoglycemia; encouraged patient to keep quick sources of sugar with her when she is away from the home; we discussed the risks/ dangers of low blood sugar levels Discussed fall history > 3 falls in 12 months: patient denies new/ recent falls since our last outreach 12/20/20-- continues using walker; fall prevention education provided; reports had a "life-alert" type system-- but "it broke, and they have kept charging me for it;" states her  son is "working to get this corrected;" this was encouraged Reviewed upcoming provider appointments: 03/28/21- follow up endocrinology provider; confirmed patient has plans to attend as scheduled Confirmed patient received previously mailed "Living Well with Diabetes" booklet; encouraged her ongoing review for further discussion  Lab Results  Component Value Date   HGBA1C 7.5 (H) 12/05/2020  Patient Goals/Self-Care Activities: As evidenced by review of EHR, collaboration with care team, and patient reporting during CCM RN CM outreach, Patient [place] will: Patient [place] will: Take medications as prescribed Attend all scheduled provider appointments Call pharmacy for medication refills Call provider office for new concerns or questions Continue to check fasting (first thing in the morning, before eating) blood sugars at home; if your blood sugars continue to be low on a regular basis and make you fell bad- please reach out to Dr. Cordelia Pen team to report this Continue trying to follow heart healthy, low salt, low cholesterol, carbohydrate-modified, low sugar diet Continue your efforts to prevent falls- continue using your walker Review enclosed educational material about what to do if your blood sugar gets too low, and how to prevent falls  Follow Up Plan:   Telephone follow up appointment  with care management team member scheduled for:  Monday, April 01, 2021 at 3:00 pm The patient has been provided with contact information for the care management team and has been advised to call with any health related questions or concerns    Plan:  Telephone follow up appointment with care management team member scheduled for:  Monday, April 01, 2021 at 3:00 pm The patient has been provided with contact information for the care management team and has been advised to call with any health related questions or concerns  Oneta Rack, RN, BSN, [place] [phone]: direct office

## 2021-02-08 ENCOUNTER — Telehealth: Payer: Self-pay | Admitting: Endocrinology

## 2021-02-08 NOTE — Telephone Encounter (Signed)
Pharmacy called stating that pen needles are needed on the below prescription;  Insulin NPH, Human,, Isophane, (HUMULIN N KWIKPEN) 100 UNIT/ML Kiwkpen  Please forward to:  [place], [place] Phone:  [phone]  Fax:  [phone]

## 2021-02-09 DIAGNOSIS — E785 Hyperlipidemia, unspecified: Secondary | ICD-10-CM

## 2021-02-09 DIAGNOSIS — E1122 Type 2 diabetes mellitus with diabetic chronic kidney disease: Secondary | ICD-10-CM

## 2021-02-09 DIAGNOSIS — N1831 Chronic kidney disease, stage 3a: Secondary | ICD-10-CM

## 2021-02-09 DIAGNOSIS — Z794 Long term (current) use of insulin: Secondary | ICD-10-CM

## 2021-02-09 DIAGNOSIS — I10 Essential (primary) hypertension: Secondary | ICD-10-CM

## 2021-02-09 DIAGNOSIS — E1169 Type 2 diabetes mellitus with other specified complication: Secondary | ICD-10-CM

## 2021-02-12 MED ORDER — INSULIN PEN NEEDLE 32G X 4 MM MISC: 3 refills | Status: DC

## 2021-02-12 NOTE — Telephone Encounter (Signed)
Pen needles have been sent

## 2021-02-26 ENCOUNTER — Other Ambulatory Visit: Payer: Self-pay

## 2021-02-26 ENCOUNTER — Encounter: Payer: Self-pay | Admitting: Podiatry

## 2021-02-26 ENCOUNTER — Ambulatory Visit (INDEPENDENT_AMBULATORY_CARE_PROVIDER_SITE_OTHER): Payer: Medicare Other | Admitting: Podiatry

## 2021-02-26 DIAGNOSIS — M79675 Pain in left toe(s): Secondary | ICD-10-CM

## 2021-02-26 DIAGNOSIS — N1832 Chronic kidney disease, stage 3b: Secondary | ICD-10-CM | POA: Diagnosis not present

## 2021-02-26 DIAGNOSIS — N184 Chronic kidney disease, stage 4 (severe): Secondary | ICD-10-CM

## 2021-02-26 DIAGNOSIS — M79674 Pain in right toe(s): Secondary | ICD-10-CM | POA: Diagnosis not present

## 2021-02-26 DIAGNOSIS — E1122 Type 2 diabetes mellitus with diabetic chronic kidney disease: Secondary | ICD-10-CM | POA: Diagnosis not present

## 2021-02-26 DIAGNOSIS — B351 Tinea unguium: Secondary | ICD-10-CM | POA: Diagnosis not present

## 2021-02-26 NOTE — Progress Notes (Signed)
This patient returns to my office for at risk foot care.  This patient requires this care by a professional since this patient will be at risk due to having diabetes and CKD.    This patient is unable to cut nails herself since the patient cannot reach her nails.These nails are painful walking and wearing shoes.  This patient presents for at risk foot care today.  General Appearance  Alert, conversant and in no acute stress.  Vascular  Dorsalis pedis and posterior tibial  pulses are  weakly palpable  bilaterally.  Capillary return is within normal limits  bilaterally. Cold feet bilaterally.  Absent digital hair noted.  Neurologic  Senn-Weinstein monofilament wire test within normal limits / diminished bilaterally. Muscle power within normal limits bilaterally.  Nails Thick disfigured discolored nails with subungual debris  from hallux to fifth toes bilaterally. No evidence of bacterial infection or drainage bilaterally.  Orthopedic  No limitations of motion  feet .  No crepitus or effusions noted.  No bony pathology or digital deformities noted.  Skin  normotropic skin with no porokeratosis noted bilaterally.  No signs of infections or ulcers noted.     Onychomycosis  Pain in right toes  Pain in left toes  Consent was obtained for treatment procedures.   Mechanical debridement of nails 1-5  bilaterally performed with a nail nipper.  Filed with dremel without incident.    Return office visit   3 months                  Told patient to return for periodic foot care and evaluation due to potential at risk complications.   Gardiner Barefoot DPM

## 2021-02-27 ENCOUNTER — Other Ambulatory Visit: Payer: Self-pay | Admitting: Internal Medicine

## 2021-03-26 ENCOUNTER — Other Ambulatory Visit: Payer: Self-pay | Admitting: Internal Medicine

## 2021-03-28 ENCOUNTER — Ambulatory Visit (INDEPENDENT_AMBULATORY_CARE_PROVIDER_SITE_OTHER): Payer: Medicare Other | Admitting: Endocrinology

## 2021-03-28 ENCOUNTER — Other Ambulatory Visit: Payer: Self-pay

## 2021-03-28 VITALS — BP 128/56 | HR 96 | Ht 66.0 in | Wt 154.8 lb

## 2021-03-28 DIAGNOSIS — E1122 Type 2 diabetes mellitus with diabetic chronic kidney disease: Secondary | ICD-10-CM

## 2021-03-28 DIAGNOSIS — N183 Chronic kidney disease, stage 3 unspecified: Secondary | ICD-10-CM | POA: Diagnosis not present

## 2021-03-28 DIAGNOSIS — N1831 Chronic kidney disease, stage 3a: Secondary | ICD-10-CM

## 2021-03-28 DIAGNOSIS — Z794 Long term (current) use of insulin: Secondary | ICD-10-CM | POA: Diagnosis not present

## 2021-03-28 LAB — POCT GLYCOSYLATED HEMOGLOBIN (HGB A1C): Hemoglobin A1C: 6.5 % — AB (ref 4.0–5.6)

## 2021-03-28 MED ORDER — HUMULIN N KWIKPEN 100 UNIT/ML ~~LOC~~ SUPN: 46.0000 [IU] | PEN_INJECTOR | SUBCUTANEOUS | 3 refills | Status: DC

## 2021-03-28 NOTE — Patient Instructions (Signed)
check your blood sugar twice a day.  vary the time of day when you check, between before the 3 meals, and at bedtime.  also check if you have symptoms of your blood sugar being too high or too low.  please keep a record of the readings and bring it to your next appointment here.  You can write it on any piece of paper.  please call us sooner if your blood sugar goes below 70, or if you have a lot of readings over 200.   On this type of insulin schedule, you should eat meals on a regular schedule.  If a meal is missed or significantly delayed, your blood sugar could go low.   Blood tests are requested for you today.  We'll let you know about the results.  Please come back for a follow-up appointment in 2 months.

## 2021-03-28 NOTE — Progress Notes (Signed)
Subjective:    Patient ID: Jocelyn Mendez, female    DOB: [birth year], 88 y.o.   MRN: [id number]  HPI Pt returns for f/u of diabetes mellitus:  DM type: Insulin-requiring type 2.  Dx'ed: 1988.   Complications: PN and stage 4 CRI.   Therapy: insulin since 2012.  GDM: never.  DKA: never Severe hypoglycemia: never.   Pancreatitis: never.   SDOH: She takes her own insulin, and checks her own cbg.   Other: she declines multiple daily injections; she uses pen, due to visual problems; on levemir, she had am hypoglycemia and pm hyperglycemia, so she was changed to QAM NPH; fructosamine converts to A1c much higher than A1c itself.   Interval history:  pt states she feels well in general.  she brings his meter with his cbg's which I have reviewed today.  Cbg varies from 73-562.  There is no trend throughout the day.  She says she never misses the insulin.  She takes 50 units qam.   Past Medical History:  Diagnosis Date   Acute bronchitis    Anemia of other chronic disease    Anxiety state, unspecified    Chest pain, unspecified    Diverticulosis of colon (without mention of hemorrhage)    Esophageal reflux    Gallstones    Generalized osteoarthrosis, unspecified site    Headache(784.0)    Lumbago    Neuropathy in diabetes ([place])    bilat LE's   Osteoporosis    Other and unspecified hyperlipidemia    Other B-complex deficiencies    Type II or unspecified type diabetes mellitus without mention of complication, not stated as uncontrolled    Unspecified disorder resulting from impaired renal function    Unspecified essential hypertension    Unspecified hypothyroidism     Past Surgical History:  Procedure Laterality Date   APPENDECTOMY     THYROIDECTOMY      Social History   Socioeconomic History   Marital status: Divorced    Spouse name: Not on file   Number of children: 6   Years of education: Not on file   Highest education level: Not on file  Occupational History    Occupation: retired  Tobacco Use   Smoking status: Former    Packs/day: 0.50    Years: 6.00    Pack years: 3.00    Types: Cigarettes    Quit date: 02/10/1973    Years since quitting: 48.1   Smokeless tobacco: Former    Types: Snuff    Quit date: 04/07/2010  Vaping Use   Vaping Use: Never used  Substance and Sexual Activity   Alcohol use: No    Alcohol/week: 0.0 standard drinks   Drug use: No   Sexual activity: Never  Other Topics Concern   Not on file  Social History Narrative   1 child passed away at 27 weeks old---?crib death   Dtr; [name]; Son lives with her [name]; [name]; roger;    Social Determinants of Health   Financial Resource Strain: Not on file  Food Insecurity: No Food Insecurity   Worried About Charity fundraiser in the Last Year: Never true   Arboriculturist in the Last Year: Never true  Transportation Needs: No Transportation Needs   Lack of Transportation (Medical): No   Lack of Transportation (Non-Medical): No  Physical Activity: Not on file  Stress: Not on file  Social Connections: Not on file  Intimate Partner Violence: Not  on file    Current Outpatient Medications on File Prior to Visit  Medication Sig Dispense Refill   acetaminophen (TYLENOL) 500 MG tablet Take 500 mg by mouth every 6 (six) hours as needed.     albuterol (PROVENTIL HFA;VENTOLIN HFA) 108 (90 BASE) MCG/ACT inhaler Inhale 2 puffs into the lungs every 6 (six) hours as needed for wheezing or shortness of breath. 1 Inhaler 6   amLODipine (NORVASC) 5 MG tablet TAKE 1 TABLET BY MOUTH EVERY DAY 90 tablet 1   ascorbic acid (VITAMIN C) 500 MG tablet Take 1 tablet (500 mg total) by mouth daily. 30 tablet 0   aspirin 81 MG tablet Take 81 mg by mouth daily.     Blood Glucose Monitoring Suppl (ACCU-CHEK GUIDE ME) w/Device KIT 1 each by Does not apply route 4 (four) times daily. E11.9 1 kit 0   busPIRone (BUSPAR) 5 MG tablet TAKE 1 TABLET BY MOUTH TWICE DAILY 60 tablet 0    Cholecalciferol (VITAMIN D3) 2000 UNITS TABS Take 2,000 Units by mouth 2 (two) times daily.      cyanocobalamin (,VITAMIN B-12,) 1000 MCG/ML injection INJECT ONE ML INTRAMUSCULARLY EVERY MONTH 1 mL 11   fenofibrate 160 MG tablet TAKE 1 TABLET BY MOUTH DAILY 30 tablet 5   ferrous sulfate 325 (65 FE) MG EC tablet Take 1 tablet (325 mg total) by mouth daily with breakfast.  3   gabapentin (NEURONTIN) 100 MG capsule TAKE ONE CAPSULE BY MOUTH THREE TIMES DAILY 90 capsule 5   glucose blood (ONETOUCH VERIO) test strip 1 each by Other route 2 (two) times daily. And lancets 2/day 200 strip 3   iron polysaccharides (NU-IRON) 150 MG capsule Take 1 capsule (150 mg total) by mouth daily. 90 capsule 1   Lancet Devices (ADJUSTABLE LANCING DEVICE) MISC Apply 1 each topically daily.     latanoprost (XALATAN) 0.005 % ophthalmic solution Place 1 drop into both eyes at bedtime.  11   levothyroxine (SYNTHROID) 137 MCG tablet TAKE 1 TABLET BY MOUTH DAILY BEFORE breakfast 90 tablet 3   lidocaine (LIDODERM) 5 % APPLY ONE PATCH TO SKIN EVERY DAY. REMOVE AND DISCARD PATCH WITHIN 12 HOURS OR AS DIRECTED BY MD - (Patient taking differently: Place 1 patch onto the skin daily.) 30 patch 0   loperamide (IMODIUM A-D) 2 MG tablet Take 2 mg by mouth 4 (four) times daily as needed for diarrhea or loose stools.     NEEDLE, DISP, 25 G (BD DISP NEEDLES) 25G X 5/8" MISC Use as directed to administer  the b12 injection monthly 50 each 2   rosuvastatin (CRESTOR) 20 MG tablet TAKE 1 TABLET BY MOUTH DAILY 90 tablet 3   saccharomyces boulardii (FLORASTOR) 250 MG capsule Take 1 capsule (250 mg total) by mouth 2 (two) times daily. 60 capsule 0   zinc sulfate 220 (50 Zn) MG capsule Take 1 capsule (220 mg total) by mouth daily. 30 capsule 0   No current facility-administered medications on file prior to visit.    Allergies  Allergen Reactions   Codeine Other (See Comments)    REACTION: change in mental status   Diphenhydramine Hcl Itching    Quinine Other (See Comments)    REACTION: abnormal sensations in face   Sulfonamide Derivatives Rash    Family History  Problem Relation Age of Onset   Diabetes Mother    Cancer Father        unsure ? stomach   Heart attack Brother  x 3   Cancer Sister        unsure type   Lung cancer Brother        smoker   Heart attack Sister     BP (!) 128/56    Pulse 96    Ht '5\' 6"'  (1.676 m)    Wt 154 lb 12.8 oz (70.2 kg)    SpO2 98%    BMI 24.99 kg/m    Review of Systems     Objective:   Physical Exam Pulses: dorsalis pedis intact bilat.   MSK: no deformity of the feet.   CV: no leg edema, but there are bilat vv's. Skin:  no ulcer on the feet.  normal color and temp on the feet.   Neuro: sensation is intact to touch on the feet.   Ext: there is bilateral onychomycosis of the toenails.     Lab Results  Component Value Date   HGBA1C 6.5 (A) 03/28/2021      Assessment & Plan:  Insulin-requiring type 2 DM: despite inaccuracy of A1c, this is overcontrolled.  Reduce insulin to 46 units qam

## 2021-04-01 ENCOUNTER — Telehealth: Payer: Medicare Other

## 2021-04-01 ENCOUNTER — Telehealth: Payer: Self-pay | Admitting: *Deleted

## 2021-04-01 ENCOUNTER — Encounter: Payer: Self-pay | Admitting: *Deleted

## 2021-04-01 NOTE — Telephone Encounter (Signed)
°  Chronic Care Management   Follow Up Note   04/01/2021 Name: Jocelyn Mendez MRN: [id number] DOB: Dec 12, 1930  Referred by: Hoyt Koch, MD Reason for referral : Chronic Care Management (CCM RN CM Follow Up Telephone Visit; DMII; HLD, Unsuccessful Outreach)  An unsuccessful telephone outreach was attempted today. The patient was referred to the case management team for assistance with care management and care coordination.   Follow Up Plan:  A HIPPA compliant phone message was left for the patient providing contact information and requesting a return call Will place request with scheduling care guide to contact patient to re-schedule today's missed CCM RN follow up telephone appointment if I do not hear back from patient by end of day  Oneta Rack, RN, BSN, [place] [phone]: direct office

## 2021-04-03 ENCOUNTER — Encounter: Payer: Self-pay | Admitting: Endocrinology

## 2021-04-15 NOTE — Telephone Encounter (Signed)
RS 04/30/21 ? ?Jocelyn Mendez  ?Care Guide, Embedded Care Coordination ?[place]  Care Management  ?Direct Dial: [phone] ? ?

## 2021-04-29 ENCOUNTER — Other Ambulatory Visit: Payer: Self-pay | Admitting: Endocrinology

## 2021-04-29 ENCOUNTER — Other Ambulatory Visit: Payer: Self-pay | Admitting: Internal Medicine

## 2021-04-29 DIAGNOSIS — E1122 Type 2 diabetes mellitus with diabetic chronic kidney disease: Secondary | ICD-10-CM

## 2021-04-30 ENCOUNTER — Ambulatory Visit (INDEPENDENT_AMBULATORY_CARE_PROVIDER_SITE_OTHER): Payer: Medicare Other | Admitting: *Deleted

## 2021-04-30 DIAGNOSIS — E1169 Type 2 diabetes mellitus with other specified complication: Secondary | ICD-10-CM

## 2021-04-30 DIAGNOSIS — Z794 Long term (current) use of insulin: Secondary | ICD-10-CM

## 2021-05-01 NOTE — Chronic Care Management (AMB) (Signed)
?Chronic Care Management  ? ?CCM RN Visit Note ? ?05/01/2021 ?Name: Jocelyn Mendez MRN: [id number] DOB: [birth year] ? ?Subjective: ?Jocelyn Mendez is a 88 y.o. year old female who is a primary care patient of Hoyt Koch, MD. The care management team was consulted for assistance with disease management and care coordination needs.   ? ?Engaged with patient by telephone for follow up visit in response to provider referral for case management and/or care coordination services.  ? ?Consent to Services:  ?The patient was given information about Chronic Care Management services, agreed to services, and gave verbal consent prior to initiation of services.  Please see initial visit note for detailed documentation.  ?Patient agreed to services and verbal consent obtained.  ? ?Assessment: Review of patient past medical history, allergies, medications, health status, including review of consultants reports, laboratory and other test data, was performed as part of comprehensive evaluation and provision of chronic care management services.  ? ?SDOH (Social Determinants of Health) assessments and interventions performed:  ?SDOH Interventions   ? ?Flowsheet Row Most Recent Value  ?SDOH Interventions   ?Food Insecurity Interventions Intervention Not Indicated  [Continues to deny food insecurity]  ?Transportation Interventions Intervention Not Indicated  [Patient continues to report family provides transportation]  ? ?  ?CCM Care Plan ? ?Allergies  ?Allergen Reactions  ? Codeine Other (See Comments)  ?  REACTION: change in mental status  ? Diphenhydramine Hcl Itching  ? Quinine Other (See Comments)  ?  REACTION: abnormal sensations in face  ? Sulfonamide Derivatives Rash  ? ?Outpatient Encounter Medications as of 04/30/2021  ?Medication Sig Note  ? acetaminophen (TYLENOL) 500 MG tablet Take 500 mg by mouth every 6 (six) hours as needed.   ? albuterol (PROVENTIL HFA;VENTOLIN HFA) 108 (90 BASE) MCG/ACT inhaler Inhale 2 puffs  into the lungs every 6 (six) hours as needed for wheezing or shortness of breath.   ? amLODipine (NORVASC) 5 MG tablet TAKE 1 TABLET BY MOUTH EVERY DAY   ? ascorbic acid (VITAMIN C) 500 MG tablet Take 1 tablet (500 mg total) by mouth daily.   ? aspirin 81 MG tablet Take 81 mg by mouth daily.   ? Blood Glucose Monitoring Suppl (ACCU-CHEK GUIDE ME) w/Device KIT 1 each by Does not apply route 4 (four) times daily. E11.9   ? busPIRone (BUSPAR) 5 MG tablet TAKE 1 TABLET BY MOUTH TWICE DAILY   ? Cholecalciferol (VITAMIN D3) 2000 UNITS TABS Take 2,000 Units by mouth 2 (two) times daily.    ? cyanocobalamin (,VITAMIN B-12,) 1000 MCG/ML injection INJECT ONE ML INTRAMUSCULARLY EVERY MONTH   ? fenofibrate 160 MG tablet TAKE 1 TABLET BY MOUTH DAILY   ? ferrous sulfate 325 (65 FE) MG EC tablet Take 1 tablet (325 mg total) by mouth daily with breakfast.   ? gabapentin (NEURONTIN) 100 MG capsule TAKE ONE CAPSULE BY MOUTH THREE TIMES DAILY   ? glucose blood (ONETOUCH VERIO) test strip 1 each by Other route 2 (two) times daily. And lancets 2/day   ? HUMULIN N KWIKPEN 100 UNIT/ML KwikPen INJECT 54 UNITS INTO THE SKIN EVERY MORNING 04/30/2021: 04/30/21- reports dose was decreased to 46 U QD by endocrinologist 03/28/21  ? iron polysaccharides (NU-IRON) 150 MG capsule Take 1 capsule (150 mg total) by mouth daily.   ? Lancet Devices (ADJUSTABLE LANCING DEVICE) MISC Apply 1 each topically daily.   ? latanoprost (XALATAN) 0.005 % ophthalmic solution Place 1 drop into both eyes at bedtime.   ?  levothyroxine (SYNTHROID) 137 MCG tablet TAKE 1 TABLET BY MOUTH DAILY BEFORE breakfast   ? lidocaine (LIDODERM) 5 % APPLY ONE PATCH TO SKIN EVERY DAY. REMOVE AND DISCARD PATCH WITHIN 12 HOURS OR AS DIRECTED BY MD - (Patient taking differently: Place 1 patch onto the skin daily.) 11/12/2018: Needs refilled  ? loperamide (IMODIUM A-D) 2 MG tablet Take 2 mg by mouth 4 (four) times daily as needed for diarrhea or loose stools.   ? NEEDLE, DISP, 25 G (BD DISP  NEEDLES) 25G X 5/8" MISC Use as directed to administer  the b12 injection monthly   ? saccharomyces boulardii (FLORASTOR) 250 MG capsule Take 1 capsule (250 mg total) by mouth 2 (two) times daily.   ? zinc sulfate 220 (50 Zn) MG capsule Take 1 capsule (220 mg total) by mouth daily.   ? [DISCONTINUED] rosuvastatin (CRESTOR) 20 MG tablet TAKE 1 TABLET BY MOUTH DAILY   ? ?No facility-administered encounter medications on file as of 04/30/2021.  ? ?Patient Active Problem List  ? Diagnosis Date Noted  ? Pain due to onychomycosis of toenails of both feet 02/26/2021  ? Urinary tract infection without hematuria 12/05/2020  ? Iron deficiency 12/05/2020  ? Sepsis secondary to UTI ([place]) 11/20/2020  ? COVID-19 virus infection 11/20/2020  ? Hyperglycemia due to diabetes mellitus ([place]) 11/20/2020  ? Elevated AST (SGOT) 11/20/2020  ? Urge incontinence 11/20/2020  ? Urinary incontinence 09/22/2019  ? Routine general medical examination at a health care facility 03/05/2019  ? Left breast lump 12/08/2017  ? Allergic rhinitis 12/08/2017  ? Asthma 08/27/2016  ? Diabetes ([place]) 03/28/2015  ? Gait abnormality 05/13/2010  ? B12 deficiency 11/23/2007  ? Anemia, chronic disease 01/21/2007  ? CKD (chronic kidney disease) stage 4, GFR 15-29 ml/min ([facility]) 01/21/2007  ? Hypothyroidism 11/21/2006  ? Mixed hyperlipidemia 11/21/2006  ? Essential hypertension 11/21/2006  ? GERD 11/21/2006  ? DEGENERATIVE JOINT DISEASE, GENERALIZED 11/21/2006  ? Chronic bilateral low back pain without sciatica 11/21/2006  ? Osteoporosis 11/21/2006  ? ?Conditions to be addressed/monitored:  HLD and DMII ? ?Care Plan : RN Care Manager Plan of Care  ?Updates made by Knox Royalty, RN since 05/01/2021 12:00 AM  ?  ? ?Problem: Chronic Disease Management Needs   ?Priority: High  ?  ? ?Long-Range Goal: Ongoing adherence to established plan of care for long term chronic disease management   ?Start Date: 12/20/2020  ?Expected End Date: 12/20/2021  ?Priority: High  ?Note:    ?Current Barriers:  ?Chronic Disease Management support and education needs related to HLD and DMII ?Recent hospitalization October 11-13, 2022 for sepsis/ UTI ?HOH- no hearing aids yet; hoping to get soon; reports working with insurance company ?Multiple falls without serious injury: High Fall Risk; uses walker regularly; frequent episodes hypoglycemia; reports last fall as "May of 2022" ? ?RNCM Clinical Goal(s):  ?Patient will demonstrate Ongoing health management independence DMII; HLD  through collaboration with RN Care manager, provider, and care team.  ? ?Interventions: ?1:1 collaboration with primary care provider regarding development and update of comprehensive plan of care as evidenced by provider attestation and co-signature ?Inter-disciplinary care team collaboration (see longitudinal plan of care) ?Evaluation of current treatment plan related to  self management and patient's adherence to plan as established by provider ?04/30/21: ?Review of patient status, including review of consultants reports, relevant laboratory and other test results, and medications completed ?SDOH updated: no new/ unmet concerns identified ?Pain assessment updated: denies pain today ?Falls assessment updated: denies new/ recent  falls since last outreach 01/30/21; reports continues using walker, "all the time;"  positive reinforcement provided with encouragement to continue efforts at fall prevention; previously provided education around fall risks/ prevention reinforced ?Medications discussed:  denies current concerns/ issues/ questions around medications; endorses adherence to taking all medications as prescribed; daughter continues assisting with medication preparation, patient then takes independently ?Reviewed upcoming scheduled provider appointments: 05/28/21- podiatry; 06/06/21- endocrinology provider; "dental visit"- "sometime in May;" patient confirms is aware of all and has plans to attend as scheduled ?Discussed plans  with patient for ongoing care management follow up and provided patient with direct contact information for care management team    ? ?Hyperlipidemia Interventions:  Goal Status: 04/30/21: on track  [place]

## 2021-05-01 NOTE — Patient Instructions (Addendum)
Visit Information ? ?Jocelyn Mendez, thank you for taking time to talk with me today. Please don't hesitate to contact me if I can be of assistance to you before our next scheduled telephone appointment ? ?Below are the goals we discussed today:  ?Jocelyn Self-Care Activities: ?Jocelyn Mendez will: ?Please make an appointment with Dr. Sharlet Salina for your annual exam; your last office visit with Dr. Sharlet Salina was on July 30, 2020, so your annual exam is due in late June, 2023 ?Take medications as prescribed ?Attend all scheduled provider appointments ?Call pharmacy for medication refills ?Call provider office for new concerns or questions ?Continue to check fasting (first thing in the morning, before eating) blood sugars at home- the blood sugar values we reviewed today are in good range ?Continue trying to follow heart healthy, low salt, low cholesterol, carbohydrate-modified, low sugar diet ?Continue your efforts to prevent falls- continue using your walker ? ?Our next scheduled telephone follow up visit/ appointment is scheduled on: Tuesday, Jun 18, 2021 at 2:15 pm- This is a PHONE [place] appointment ? ?If you need to cancel or re-schedule our visit, please call [phone] and our care guide team will be happy to assist you. ?  ?I look forward to hearing about your progress. ?  ?Jocelyn Rack, RN, BSN, CCRN Alumnus ?[name] ?[phone]: direct office ? ?If you are experiencing a Mental Health or [place] or need someone to talk to, please  ?call the Suicide and Crisis Lifeline: 988 ?call the [place] National Suicide Prevention Lifeline: 732-635-0175 or TTY: (779) 635-0927 TTY 206-356-4842) to talk to a trained counselor ?call 1-800-273-TALK (toll free, 24 hour hotline) ?go to [facility] [address] [place] [phone]) ?call 911  ? ?The Jocelyn verbalized understanding of instructions, educational materials, and  care plan provided today and agreed to receive a mailed copy of Jocelyn instructions, educational materials, and care plan ? ?Diabetes Mellitus and Standards of Medical Care ?Living with and managing diabetes (diabetes mellitus) can be complicated. Your diabetes treatment may be managed by a team of health care providers, including: ?A physician who specializes in diabetes (endocrinologist). You might also have visits with a nurse practitioner or physician assistant. ?Nurses. ?A registered dietitian. ?A certified diabetes care and education specialist. ?An exercise specialist. ?A pharmacist. ?An eye doctor. ?A foot specialist (podiatrist). ?A dental care provider. ?A primary care provider. ?A mental health care provider. ?How to manage your diabetes ?You can do many things to successfully manage your diabetes. Your health care providers will follow guidelines to help you get the best quality of care. Here are general guidelines for your diabetes management plan. Your health care providers may give you more specific instructions. ?Physical exams ?When you are diagnosed with diabetes, and each year after that, your health care provider will ask about your medical and family history. You will have a physical exam, which may include: ?Measuring your height, weight, and body mass index (BMI). ?Checking your blood pressure. This will be done at every routine medical visit. Your target blood pressure may vary depending on your medical conditions, your age, and other factors. ?A thyroid exam. ?A skin exam. ?Screening for nerve damage (peripheral neuropathy). This may include checking the pulse in your legs and feet and the level of sensation in your hands and feet. ?A foot exam to inspect the structure and skin of your feet, including checking for cuts, bruises, redness, blisters, sores, or other problems. ?Screening for  blood vessel (vascular) problems. This may include checking the pulse in your legs and feet and checking  your temperature. ?Blood tests ?Depending on your treatment plan and your personal needs, you may have the following tests: ?Hemoglobin A1C (HbA1C). This test provides information about blood sugar (glucose) control over the previous 2-3 months. It is used to adjust your treatment plan, if needed. This test will be done: ?At least 2 times a year, if you are meeting your treatment goals. ?4 times a year, if you are not meeting your treatment goals or if your goals have changed. ?Lipid testing, including total cholesterol, LDL and HDL cholesterol, and triglyceride levels. ?The goal for LDL is less than 100 mg/dL (5.5 mmol/L). If you are at high risk for complications, the goal is less than 70 mg/dL (3.9 mmol/L). ?The goal for HDL is 40 mg/dL (2.2 mmol/L) or higher for men, and 50 mg/dL (2.8 mmol/L) or higher for women. An HDL cholesterol of 60 mg/dL (3.3 mmol/L) or higher gives some protection against heart disease. ?The goal for triglycerides is less than 150 mg/dL (8.3 mmol/L). ?Liver function tests. ?Kidney function tests. ?Thyroid function tests. ? ?Dental and eye exams ? ?Visit your dentist two times a year. ?If you have type 1 diabetes, your health care provider may recommend an eye exam within 5 years after you are diagnosed, and then once a year after your first exam. ?For children with type 1 diabetes, the health care provider may recommend an eye exam when your child is age 27 or older and has had diabetes for 3-5 years. After the first exam, your child should get an eye exam once a year. ?If you have type 2 diabetes, your health care provider may recommend an eye exam as soon as you are diagnosed, and then every 1-2 years after your first exam. ?Immunizations ?A yearly flu (influenza) vaccine is recommended annually for everyone 6 months or older. This is especially important if you have diabetes. ?The pneumonia (pneumococcal) vaccine is recommended for everyone 2 years or older who has diabetes. If you are  age 75 or older, you may get the pneumonia vaccine as a series of two separate shots. ?The hepatitis B vaccine is recommended for adults shortly after being diagnosed with diabetes. ?Adults and children with diabetes should receive all other vaccines according to age-specific recommendations from the Centers for Disease Control and Prevention (CDC). ?Mental and emotional health ?Screening for symptoms of eating disorders, anxiety, and depression is recommended at the time of diagnosis and after as needed. If your screening shows that you have symptoms, you may need more evaluation. You may work with a mental health care provider. ?Follow these instructions at home: ?Treatment plan ?You will monitor your blood glucose levels and may give yourself insulin. Your treatment plan will be reviewed at every medical visit. You and your health care provider will discuss: ?How you are taking your medicines, including insulin. ?Any side effects you have. ?Your blood glucose level target goals. ?How often you monitor your blood glucose level. ?Lifestyle habits, such as activity level and tobacco, alcohol, and substance use. ?Education ?Your health care provider will assess how well you are monitoring your blood glucose levels and whether you are taking your insulin and medicines correctly. He or she may refer you to: ?A certified diabetes care and education specialist to manage your diabetes throughout your life, starting at diagnosis. ?A registered dietitian who can create and review your personal nutrition plan. ?An exercise specialist who  can discuss your activity level and exercise plan. ?General instructions ?Take over-the-counter and prescription medicines only as told by your health care provider. ?Keep all follow-up visits. This is important. ?Where to find support ?There are many diabetes support networks, including: ?American Diabetes Association (ADA): diabetes.org ?Defeat Diabetes Foundation:  defeatdiabetes.org ?Where to find more information ?American Diabetes Association (ADA): www.diabetes.org ?Association of Diabetes Care & Education Specialists (ADCES): diabeteseducator.org ?International Diabetes Federation

## 2021-05-10 DIAGNOSIS — Z794 Long term (current) use of insulin: Secondary | ICD-10-CM

## 2021-05-10 DIAGNOSIS — E785 Hyperlipidemia, unspecified: Secondary | ICD-10-CM

## 2021-05-10 DIAGNOSIS — E1169 Type 2 diabetes mellitus with other specified complication: Secondary | ICD-10-CM

## 2021-05-28 ENCOUNTER — Encounter: Payer: Self-pay | Admitting: Podiatry

## 2021-05-28 ENCOUNTER — Ambulatory Visit (INDEPENDENT_AMBULATORY_CARE_PROVIDER_SITE_OTHER): Payer: Medicare Other | Admitting: Podiatry

## 2021-05-28 DIAGNOSIS — N184 Chronic kidney disease, stage 4 (severe): Secondary | ICD-10-CM

## 2021-05-28 DIAGNOSIS — M79675 Pain in left toe(s): Secondary | ICD-10-CM

## 2021-05-28 DIAGNOSIS — B351 Tinea unguium: Secondary | ICD-10-CM | POA: Diagnosis not present

## 2021-05-28 DIAGNOSIS — E1122 Type 2 diabetes mellitus with diabetic chronic kidney disease: Secondary | ICD-10-CM | POA: Diagnosis not present

## 2021-05-28 DIAGNOSIS — M79674 Pain in right toe(s): Secondary | ICD-10-CM

## 2021-05-28 DIAGNOSIS — N1832 Chronic kidney disease, stage 3b: Secondary | ICD-10-CM

## 2021-05-28 NOTE — Progress Notes (Signed)
This patient returns to my office for at risk foot care.  This patient requires this care by a professional since this patient will be at risk due to having diabetes and CKD.    This patient is unable to cut nails herself since the patient cannot reach her nails.These nails are painful walking and wearing shoes.  This patient presents for at risk foot care today. ? ?General Appearance  Alert, conversant and in no acute stress. ? ?Vascular  Dorsalis pedis and posterior tibial  pulses are  weakly palpable  bilaterally.  Capillary return is within normal limits  bilaterally. Cold feet bilaterally.  Absent digital hair noted. ? ?Neurologic  Senn-Weinstein monofilament wire test within normal limits / diminished bilaterally. Muscle power within normal limits bilaterally. ? ?Nails Thick disfigured discolored nails with subungual debris  from hallux to fifth toes bilaterally. No evidence of bacterial infection or drainage bilaterally. ? ?Orthopedic  No limitations of motion  feet .  No crepitus or effusions noted.  No bony pathology or digital deformities noted. ? ?Skin  normotropic skin with no porokeratosis noted bilaterally.  No signs of infections or ulcers noted.    ? ?Onychomycosis  Pain in right toes  Pain in left toes ? ?Consent was obtained for treatment procedures.   Mechanical debridement of nails 1-5  bilaterally performed with a nail nipper.  Filed with dremel without incident.  ? ? ?Return office visit   4   months                  Told patient to return for periodic foot care and evaluation due to potential at risk complications. ? ? ?Gardiner Barefoot DPM   ?

## 2021-05-29 ENCOUNTER — Other Ambulatory Visit: Payer: Self-pay | Admitting: Internal Medicine

## 2021-06-06 ENCOUNTER — Ambulatory Visit (INDEPENDENT_AMBULATORY_CARE_PROVIDER_SITE_OTHER): Payer: Medicare Other | Admitting: Endocrinology

## 2021-06-06 ENCOUNTER — Encounter: Payer: Self-pay | Admitting: Endocrinology

## 2021-06-06 VITALS — BP 142/68 | HR 65 | Ht 66.0 in | Wt 154.4 lb

## 2021-06-06 DIAGNOSIS — Z794 Long term (current) use of insulin: Secondary | ICD-10-CM

## 2021-06-06 DIAGNOSIS — N1831 Chronic kidney disease, stage 3a: Secondary | ICD-10-CM

## 2021-06-06 DIAGNOSIS — E1122 Type 2 diabetes mellitus with diabetic chronic kidney disease: Secondary | ICD-10-CM | POA: Diagnosis not present

## 2021-06-06 LAB — POCT GLYCOSYLATED HEMOGLOBIN (HGB A1C): Hemoglobin A1C: 7 % — AB (ref 4.0–5.6)

## 2021-06-06 NOTE — Progress Notes (Signed)
? ?Subjective:  ? ? Patient ID: Jocelyn Mendez, female    DOB: [birth year], 88 y.o.   MRN: [id number] ? ?HPI ?Pt returns for f/u of diabetes mellitus:  ?DM type: Insulin-requiring type 2.  ?Dx'ed: 1988.   ?Complications: PN and stage 4 CRI.   ?Therapy: insulin since 2012.  ?GDM: never.  ?DKA: never ?Severe hypoglycemia: never.   ?Pancreatitis: never.   ?SDOH: She takes her own insulin, and checks her own cbg.   ?Other: she declines multiple daily injections; she uses pen, due to visual problems; on levemir, she had am hypoglycemia and pm hyperglycemia, so she was changed to QAM NPH; fructosamine converts to A1c much higher than A1c itself.   ?Interval history:  pt states she feels well in general.  she brings his meter with his cbg's which I have reviewed today.  Cbg varies from 112-267.  There is no trend throughout the day.  She says she never misses the insulin.  She seldom has hypoglycemia, and these episodes are mild.  This happens when she sleeps late.   ?Past Medical History:  ?Diagnosis Date  ? Acute bronchitis   ? Anemia of other chronic disease   ? Anxiety state, unspecified   ? Chest pain, unspecified   ? Diverticulosis of colon (without mention of hemorrhage)   ? Esophageal reflux   ? Gallstones   ? Generalized osteoarthrosis, unspecified site   ? Headache(784.0)   ? Lumbago   ? Neuropathy in diabetes [facility])   ? bilat LE's  ? Osteoporosis   ? Other and unspecified hyperlipidemia   ? Other B-complex deficiencies   ? Type II or unspecified type diabetes mellitus without mention of complication, not stated as uncontrolled   ? Unspecified disorder resulting from impaired renal function   ? Unspecified essential hypertension   ? Unspecified hypothyroidism   ? ? ?Past Surgical History:  ?Procedure Laterality Date  ? APPENDECTOMY    ? THYROIDECTOMY    ? ? ?Social History  ? ?Socioeconomic History  ? Marital status: Divorced  ?  Spouse name: Not on file  ? Number of children: 6  ? Years of education: Not on file   ? Highest education level: Not on file  ?Occupational History  ? Occupation: retired  ?Tobacco Use  ? Smoking status: Former  ?  Packs/day: 0.50  ?  Years: 6.00  ?  Pack years: 3.00  ?  Types: Cigarettes  ?  Quit date: 02/10/1973  ?  Years since quitting: 48.3  ? Smokeless tobacco: Former  ?  Types: Snuff  ?  Quit date: 04/07/2010  ?Vaping Use  ? Vaping Use: Never used  ?Substance and Sexual Activity  ? Alcohol use: No  ?  Alcohol/week: 0.0 standard drinks  ? Drug use: No  ? Sexual activity: Never  ?Other Topics Concern  ? Not on file  ?Social History Narrative  ? 1 child passed away at 96 weeks old---?crib death  ? Dtr; [name]; Son lives with her roy  ? Estill Bamberg; [name]; roger;   ? ?Social Determinants of Health  ? ?Financial Resource Strain: Not on file  ?Food Insecurity: No Food Insecurity  ? Worried About Charity fundraiser in the Last Year: Never true  ? Ran Out of Food in the Last Year: Never true  ?Transportation Needs: No Transportation Needs  ? Lack of Transportation (Medical): No  ? Lack of Transportation (Non-Medical): No  ?Physical Activity: Not on file  ?Stress: Not on file  ?  Social Connections: Not on file  ?Intimate Partner Violence: Not on file  ? ? ?Current Outpatient Medications on File Prior to Visit  ?Medication Sig Dispense Refill  ? acetaminophen (TYLENOL) 500 MG tablet Take 500 mg by mouth every 6 (six) hours as needed.    ? albuterol (PROVENTIL HFA;VENTOLIN HFA) 108 (90 BASE) MCG/ACT inhaler Inhale 2 puffs into the lungs every 6 (six) hours as needed for wheezing or shortness of breath. 1 Inhaler 6  ? amLODipine (NORVASC) 5 MG tablet TAKE 1 TABLET BY MOUTH EVERY DAY 90 tablet 1  ? ascorbic acid (VITAMIN C) 500 MG tablet Take 1 tablet (500 mg total) by mouth daily. 30 tablet 0  ? aspirin 81 MG tablet Take 81 mg by mouth daily.    ? Blood Glucose Monitoring Suppl (ACCU-CHEK GUIDE ME) w/Device KIT 1 each by Does not apply route 4 (four) times daily. E11.9 1 kit 0  ? busPIRone (BUSPAR) 5 MG  tablet TAKE 1 TABLET BY MOUTH TWICE DAILY 60 tablet 0  ? Cholecalciferol (VITAMIN D3) 2000 UNITS TABS Take 2,000 Units by mouth 2 (two) times daily.     ? cyanocobalamin (,VITAMIN B-12,) 1000 MCG/ML injection INJECT ONE ML INTRAMUSCULARLY EVERY MONTH 1 mL 11  ? fenofibrate 160 MG tablet TAKE 1 TABLET BY MOUTH DAILY 30 tablet 5  ? ferrous sulfate 325 (65 FE) MG EC tablet Take 1 tablet (325 mg total) by mouth daily with breakfast.  3  ? gabapentin (NEURONTIN) 100 MG capsule TAKE ONE CAPSULE BY MOUTH THREE TIMES DAILY 90 capsule 5  ? glucose blood (ONETOUCH VERIO) test strip 1 each by Other route 2 (two) times daily. And lancets 2/day 200 strip 3  ? HUMULIN N KWIKPEN 100 UNIT/ML KwikPen INJECT 54 UNITS INTO THE SKIN EVERY MORNING 60 mL 3  ? iron polysaccharides (NU-IRON) 150 MG capsule Take 1 capsule (150 mg total) by mouth daily. 90 capsule 1  ? Lancet Devices (ADJUSTABLE LANCING DEVICE) MISC Apply 1 each topically daily.    ? latanoprost (XALATAN) 0.005 % ophthalmic solution Place 1 drop into both eyes at bedtime.  11  ? levothyroxine (SYNTHROID) 137 MCG tablet TAKE 1 TABLET BY MOUTH DAILY BEFORE breakfast 90 tablet 3  ? lidocaine (LIDODERM) 5 % APPLY ONE PATCH TO SKIN EVERY DAY. REMOVE AND DISCARD PATCH WITHIN 12 HOURS OR AS DIRECTED BY MD - (Patient taking differently: Place 1 patch onto the skin daily.) 30 patch 0  ? loperamide (IMODIUM A-D) 2 MG tablet Take 2 mg by mouth 4 (four) times daily as needed for diarrhea or loose stools.    ? NEEDLE, DISP, 25 G (BD DISP NEEDLES) 25G X 5/8" MISC Use as directed to administer  the b12 injection monthly 50 each 2  ? rosuvastatin (CRESTOR) 20 MG tablet Take 1 tablet (20 mg total) by mouth daily. Annual appt is due w/ labs must see provider for future refills 30 tablet 0  ? saccharomyces boulardii (FLORASTOR) 250 MG capsule Take 1 capsule (250 mg total) by mouth 2 (two) times daily. 60 capsule 0  ? zinc sulfate 220 (50 Zn) MG capsule Take 1 capsule (220 mg total) by mouth  daily. 30 capsule 0  ? ?No current facility-administered medications on file prior to visit.  ? ? ?Allergies  ?Allergen Reactions  ? Codeine Other (See Comments)  ?  REACTION: change in mental status  ? Diphenhydramine Hcl Itching  ? Quinine Other (See Comments)  ?  REACTION: abnormal sensations in face  ?  Sulfonamide Derivatives Rash  ? ? ?Family History  ?Problem Relation Age of Onset  ? Diabetes Mother   ? Cancer Father   ?     unsure ? stomach  ? Heart attack Brother   ?     x 3  ? Cancer Sister   ?     unsure type  ? Lung cancer Brother   ?     smoker  ? Heart attack Sister   ? ? ?BP (!) 142/68 (BP Location: Left Arm, Patient Position: Sitting)   Pulse 65   Ht '5\' 6"'  (1.676 m)   Wt 154 lb 6.4 oz (70 kg)   SpO2 97%   BMI 24.92 kg/m?  ? ? ?Review of Systems ? ?   ?Objective:  ? Physical Exam ?Pulses: dorsalis pedis intact bilat.   ?MSK: no deformity of the feet.   ?CV: no leg edema, but there are bilat vv's. ?Skin:  no ulcer on the feet.  normal color and temp on the feet.   ?Neuro: sensation is intact to touch on the feet.   ?Ext: there is bilateral onychomycosis of the toenails.   ? ? ?Lab Results  ?Component Value Date  ? HGBA1C 7.0 (A) 06/06/2021  ? ?   ?Assessment & Plan:  ?Insulin-requiring type 2 DM:c uncontrolled ?Hypoglycemia, due to insulin: this limits aggressiveness of glycemic control.   ? ?Patient Instructions  ?check your blood sugar twice a day.  vary the time of day when you check, between before the 3 meals, and at bedtime.  also check if you have symptoms of your blood sugar being too high or too low.  please keep a record of the readings and bring it to your next appointment here.  You can write it on any piece of paper.  please call us sooner if your blood sugar goes below 70, or if you have a lot of readings over 200.   ?On this type of insulin schedule, you should eat meals on a regular schedule.  If a meal is missed or significantly delayed, your blood sugar could go low.   ?You should  have an endocrinology follow-up appointment in 3 months.   ? ? ? ?

## 2021-06-06 NOTE — Patient Instructions (Addendum)
check your blood sugar twice a day.  vary the time of day when you check, between before the 3 meals, and at bedtime.  also check if you have symptoms of your blood sugar being too high or too low.  please keep a record of the readings and bring it to your next appointment here.  You can write it on any piece of paper.  please call us sooner if your blood sugar goes below 70, or if you have a lot of readings over 200.   ?On this type of insulin schedule, you should eat meals on a regular schedule.  If a meal is missed or significantly delayed, your blood sugar could go low.   ?You should have an endocrinology follow-up appointment in 3 months.   ? ?

## 2021-06-11 ENCOUNTER — Telehealth: Payer: Self-pay | Admitting: Internal Medicine

## 2021-06-11 NOTE — Telephone Encounter (Signed)
Pt daughter called in and is requesting a callback.  ? ?States pt has a bed sore on her bottom and wants to know what she can take for it.  ?

## 2021-06-12 NOTE — Telephone Encounter (Signed)
Spoke with [name] and she stated that when her mother was getting a bath on Sunday is when they noticed the bed sore to her bottom. She was unable to tell me how long it has been there or how big it was, nothing has been placed on the sore as of right now. I did advise that they should keep rotating her around that way the sore does not become worse and to keep the area dry as possible.  ?

## 2021-06-13 NOTE — Telephone Encounter (Signed)
Called pt daughter [name]) no answer LMOM RTC.../;lmb ?

## 2021-06-13 NOTE — Telephone Encounter (Signed)
Can use barrier cream and avoid pressure. I am happy to see her and assess.  ?

## 2021-06-18 ENCOUNTER — Ambulatory Visit (INDEPENDENT_AMBULATORY_CARE_PROVIDER_SITE_OTHER): Payer: Medicare Other | Admitting: *Deleted

## 2021-06-18 DIAGNOSIS — E1169 Type 2 diabetes mellitus with other specified complication: Secondary | ICD-10-CM

## 2021-06-18 DIAGNOSIS — N1831 Chronic kidney disease, stage 3a: Secondary | ICD-10-CM

## 2021-06-18 NOTE — Chronic Care Management (AMB) (Signed)
?Chronic Care Management  ? ?CCM RN Visit Note ? ?06/18/2021 ?Name: Jocelyn Mendez MRN: [id number] DOB: 27-Dec-1930 ? ?Subjective: ?Jocelyn Mendez is a 88 y.o. year old female who is a primary care patient of Hoyt Koch, MD. The care management team was consulted for assistance with disease management and care coordination needs.   ? ?Engaged with patient by telephone for follow up visit in response to provider referral for case management and/or care coordination services.  ? ?Consent to Services:  ?The patient was given information about Chronic Care Management services, agreed to services, and gave verbal consent prior to initiation of services.  Please see initial visit note for detailed documentation.  ?Patient agreed to services and verbal consent obtained.  ? ?Assessment: Review of patient past medical history, allergies, medications, health status, including review of consultants reports, laboratory and other test data, was performed as part of comprehensive evaluation and provision of chronic care management services.  ? ?SDOH (Social Determinants of Health) assessments and interventions performed:  ?SDOH Interventions   ? ?Flowsheet Row Most Recent Value  ?SDOH Interventions   ?Food Insecurity Interventions Intervention Not Indicated  [continues to deny food insecurity]  ?Housing Interventions Intervention Not Indicated  [reports continues to reside with son in mobile home,  denies safety concerns, "as long as" she uses "walker" all the time]  ?Transportation Interventions Intervention Not Indicated  [Family continues to provide transportation]  ? ?  ?CCM Care Plan ? ?Allergies  ?Allergen Reactions  ? Codeine Other (See Comments)  ?  REACTION: change in mental status  ? Diphenhydramine Hcl Itching  ? Quinine Other (See Comments)  ?  REACTION: abnormal sensations in face  ? Sulfonamide Derivatives Rash  ? ?Outpatient Encounter Medications as of 06/18/2021  ?Medication Sig Note  ? acetaminophen  (TYLENOL) 500 MG tablet Take 500 mg by mouth every 6 (six) hours as needed.   ? albuterol (PROVENTIL HFA;VENTOLIN HFA) 108 (90 BASE) MCG/ACT inhaler Inhale 2 puffs into the lungs every 6 (six) hours as needed for wheezing or shortness of breath.   ? amLODipine (NORVASC) 5 MG tablet TAKE 1 TABLET BY MOUTH EVERY DAY   ? ascorbic acid (VITAMIN C) 500 MG tablet Take 1 tablet (500 mg total) by mouth daily.   ? aspirin 81 MG tablet Take 81 mg by mouth daily.   ? Blood Glucose Monitoring Suppl (ACCU-CHEK GUIDE ME) w/Device KIT 1 each by Does not apply route 4 (four) times daily. E11.9   ? busPIRone (BUSPAR) 5 MG tablet TAKE 1 TABLET BY MOUTH TWICE DAILY   ? Cholecalciferol (VITAMIN D3) 2000 UNITS TABS Take 2,000 Units by mouth 2 (two) times daily.    ? cyanocobalamin (,VITAMIN B-12,) 1000 MCG/ML injection INJECT ONE ML INTRAMUSCULARLY EVERY MONTH   ? fenofibrate 160 MG tablet TAKE 1 TABLET BY MOUTH DAILY   ? ferrous sulfate 325 (65 FE) MG EC tablet Take 1 tablet (325 mg total) by mouth daily with breakfast.   ? gabapentin (NEURONTIN) 100 MG capsule TAKE ONE CAPSULE BY MOUTH THREE TIMES DAILY   ? glucose blood (ONETOUCH VERIO) test strip 1 each by Other route 2 (two) times daily. And lancets 2/day   ? HUMULIN N KWIKPEN 100 UNIT/ML KwikPen INJECT 54 UNITS INTO THE SKIN EVERY MORNING 04/30/2021: 04/30/21- reports dose was decreased to 46 U QD by endocrinologist 03/28/21  ? iron polysaccharides (NU-IRON) 150 MG capsule Take 1 capsule (150 mg total) by mouth daily.   ? Lancet Devices (ADJUSTABLE  LANCING DEVICE) MISC Apply 1 each topically daily.   ? latanoprost (XALATAN) 0.005 % ophthalmic solution Place 1 drop into both eyes at bedtime.   ? levothyroxine (SYNTHROID) 137 MCG tablet TAKE 1 TABLET BY MOUTH DAILY BEFORE breakfast   ? lidocaine (LIDODERM) 5 % APPLY ONE PATCH TO SKIN EVERY DAY. REMOVE AND DISCARD PATCH WITHIN 12 HOURS OR AS DIRECTED BY MD - (Patient taking differently: Place 1 patch onto the skin daily.) 11/12/2018:  Needs refilled  ? loperamide (IMODIUM A-D) 2 MG tablet Take 2 mg by mouth 4 (four) times daily as needed for diarrhea or loose stools.   ? NEEDLE, DISP, 25 G (BD DISP NEEDLES) 25G X 5/8" MISC Use as directed to administer  the b12 injection monthly   ? rosuvastatin (CRESTOR) 20 MG tablet Take 1 tablet (20 mg total) by mouth daily. Annual appt is due w/ labs must see provider for future refills   ? saccharomyces boulardii (FLORASTOR) 250 MG capsule Take 1 capsule (250 mg total) by mouth 2 (two) times daily.   ? zinc sulfate 220 (50 Zn) MG capsule Take 1 capsule (220 mg total) by mouth daily.   ? ?No facility-administered encounter medications on file as of 06/18/2021.  ? ?Patient Active Problem List  ? Diagnosis Date Noted  ? Pain due to onychomycosis of toenails of both feet 02/26/2021  ? Urinary tract infection without hematuria 12/05/2020  ? Iron deficiency 12/05/2020  ? Sepsis secondary to UTI ([place]) 11/20/2020  ? COVID-19 virus infection 11/20/2020  ? Hyperglycemia due to diabetes mellitus ([place]) 11/20/2020  ? Elevated AST (SGOT) 11/20/2020  ? Urge incontinence 11/20/2020  ? Urinary incontinence 09/22/2019  ? Routine general medical examination at a health care facility 03/05/2019  ? Left breast lump 12/08/2017  ? Allergic rhinitis 12/08/2017  ? Asthma 08/27/2016  ? Diabetes ([place]) 03/28/2015  ? Gait abnormality 05/13/2010  ? B12 deficiency 11/23/2007  ? Anemia, chronic disease 01/21/2007  ? CKD (chronic kidney disease) stage 4, GFR 15-29 ml/min ([facility]) 01/21/2007  ? Hypothyroidism 11/21/2006  ? Mixed hyperlipidemia 11/21/2006  ? Essential hypertension 11/21/2006  ? GERD 11/21/2006  ? DEGENERATIVE JOINT DISEASE, GENERALIZED 11/21/2006  ? Chronic bilateral low back pain without sciatica 11/21/2006  ? Osteoporosis 11/21/2006  ? ?Conditions to be addressed/monitored:  HLD and DMII ? ?Care Plan : RN Care Manager Plan of Care  ?Updates made by Knox Royalty, RN since 06/18/2021 12:00 AM  ?  ? ?Problem: Chronic Disease  Management Needs   ?Priority: High  ?  ? ?Long-Range Goal: Ongoing adherence to established plan of care for long term chronic disease management   ?Start Date: 12/20/2020  ?Expected End Date: 12/20/2021  ?Priority: High  ?Note:   ?Current Barriers:  ?Chronic Disease Management support and education needs related to HLD and DMII ?Hospitalization October 11-13, 2022 for sepsis/ UTI ?HOH- no hearing aids yet; hoping to get soon; reports working with insurance company ?Multiple falls without serious injury: High Fall Risk; uses walker regularly; frequent episodes hypoglycemia; reports last fall as "May of 2022" ?06/18/21: continues to deny falls since "last May;" positive reinforcement provided with encouragement to continue efforts at fall prevention ? ?RNCM Clinical Goal(s):  ?Patient will demonstrate Ongoing health management independence DMII; HLD  through collaboration with RN Care manager, provider, and care team.  ? ?Interventions: ?1:1 collaboration with primary care provider regarding development and update of comprehensive plan of care as evidenced by provider attestation and co-signature ?Inter-disciplinary care team collaboration (see longitudinal  plan of care) ?Evaluation of current treatment plan related to  self management and patient's adherence to plan as established by provider ?Review of patient status, including review of consultants reports, relevant laboratory and other test results, and medications completed ?SDOH updated: no new/ unmet concerns identified ?Depression screening updated: no concerns identified ?Pain assessment updated: denies pain today ?Falls assessment updated: continues to deny new/ recent falls, now x full 12 months- continues using "walker, all the time;"  positive reinforcement provided with encouragement to continue efforts at fall prevention; previously provided education around fall risks/ prevention reinforced ?Medications discussed: reports daughter continues to assist  in medication management: she prepares pill box x one week, then patient takes independently once they are in pill box; she denies current concerns/ issues/ questions around medications; endorses adherence to [place]

## 2021-06-18 NOTE — Patient Instructions (Signed)
Visit Information ? ?Jocelyn Mendez, thank you for taking time to talk with me today. Please don't hesitate to contact me if I can be of assistance to you before our next scheduled telephone appointment ? ?Below are the goals we discussed today:  ?Patient Self-Care Activities: ?[name] will: ?Please make an appointment with Dr. Sharlet Salina for your annual exam; your last office visit with Dr. Sharlet Salina was on July 30, 2020, so your annual exam is due in late June, 2023 ?Take medications as prescribed ?Attend all scheduled provider appointments ?Call pharmacy for medication refills ?Call provider office for new concerns or questions ?Continue to check fasting (first thing in the morning, before eating) blood sugars at home- the blood sugar values we reviewed today are in good range ?Continue trying to follow heart healthy, low salt, low cholesterol, carbohydrate-modified, low sugar diet ?Continue your efforts to prevent falls- continue using your walker ? ?Our next scheduled telephone follow up visit/ appointment is scheduled on: Monday, September 16, 2021 at 2:15 pm- This is a PHONE CALL appointment ? ?If you need to cancel or re-schedule our visit, please call [phone] and our care guide team will be happy to assist you. ?  ?I look forward to hearing about your progress. ?  ?Oneta Rack, RN, BSN, CCRN Alumnus ?[place] ?([phone]: direct office ? ?If you are experiencing a Mental Health or [place] or need someone to talk to, please  ?call the Suicide and Crisis Lifeline: 988 ?call the [place] National Suicide Prevention Lifeline: 7731861813 or TTY: (951)563-8245 TTY 669-684-2423) to talk to a trained counselor ?call 1-800-273-TALK (toll free, 24 hour hotline) ?go to [facility] [address] [place] [phone]) ?call 911  ? ?The patient verbalized understanding of instructions, educational materials, and  care plan provided today and agreed to receive a mailed copy of patient instructions, educational materials, and care plan ? ?Living With Diabetes ?Diabetes (type 1 diabetes mellitus or type 2 diabetes mellitus) is a condition in which the body does not have enough of a hormone called insulin, or the body does not respond properly to insulin. Normally, insulin allows sugars (glucose) to enter cells in the body. With diabetes, extra glucose builds up in the blood instead of going into cells. This results in high blood glucose (hyperglycemia). ?How to manage lifestyle changes ?Managing diabetes includes medical treatments as well as lifestyle changes. If diabetes is not managed well, serious physical and emotional complications can occur. Taking good care of yourself means that you are responsible for: ?Monitoring glucose regularly. ?Eating a healthy diet. ?Exercising regularly. ?Meeting with health care providers. ?Taking medicines as directed. ?Most people feel some stress about managing their diabetes. When this stress becomes too much, it is known as diabetes-related distress. This is very common. Living with diabetes can place you at risk for diabetes distress, depression, or anxiety. These disorders can make diabetes more difficult to manage. ?How to recognize stress ?You may have diabetes distress if you: ?Avoid or ignore your daily diabetes care. This includes glucose testing, following a meal plan, and taking medications. ?Feel overwhelmed by your daily diabetes care. ?Experience emotional reactions such as anger, sadness, or fear related to your daily diabetes care. ?Feel fear or shame about not doing everything perfectly that you have been told to do. ?Emotional distress ?Symptoms of diabetes distress include: ?Anger about having a diagnosis of diabetes. ?Fear or frustration about your diagnosis and the changes you  need to make to manage the condition. ?Being overly worried about the care that you need or  the cost of the care that you need. ?Feeling like you caused your condition by doing something wrong. ?Fear about unpredictable fluctuations in your blood glucose, like low or high blood glucose. ?Feeling judged by your health care providers. ?Feeling very alone with the disease. ?Depression ?Having diabetes means that you are at a higher risk for depression. Your health care provider may test (screen) you for symptoms of depression. It is important to recognize symptoms and to start treatment for depression soon after it is diagnosed. The following are some symptoms of depression: ?Loss of interest in things that you used to enjoy. ?Feeling depressed much or most of the time. ?A change in appetite. ?Trouble getting to sleep or staying asleep. ?Feeling tired most of the day. ?Feeling nervous and anxious. ?Feeling guilty and worrying that you are a burden to others. ?Having thoughts of hurting yourself or feeling that you want to die. ?If you have any of these symptoms, more days than not, for 2 weeks or longer, you may have depression. This would be a good time to contact your health care provider. ?Follow these instructions at home: ?Managing diabetes distress ?The following are some ways to manage emotional distress: ?Learn as much as you can about diabetes and its treatment. Take one step at a time to improve your management. ?Meet with a certified diabetes care and education specialist. Take a class to learn how to manage your condition. ?Consider working with a Social worker or therapist. ?Keep a journal of your thoughts and concerns. ?Accept that some things are out of your control. ?Talk with other people who have diabetes. It can help to talk about the distress that you feel. ?Find ways to manage stress that work for you. These may include art or music therapy, exercise, meditation, and hobbies. ?Seek support from spiritual leaders, family, and friends. ? ?General instructions ?Do your best to follow your  diabetes management plan. ?If you are struggling to follow your plan, talk with a certified diabetes care and education specialist, or with someone else who has diabetes. They may have ideas that will help. ?Forgive yourself for not being perfect. Almost everyone struggles with the tasks of diabetes. ?Keep all follow-up visits. This is important. ?Where to find support ?Search for information and support from the American Diabetes Association: www.diabetes.org ?Find a certified diabetes education and care specialist. Make an appointment through the Association of Diabetes Care & Education Specialists: www.diabeteseducator.org ?Contact a health care provider if: ?You believe your diabetes is getting out of control. ?You are concerned you may be depressed. ?You think your medications are not helping control your diabetes. ?You are feeling overwhelmed with your diabetes. ?Get help right away if: ?You have thoughts about hurting yourself or others. ?If you ever feel like you may hurt yourself or others, or have thoughts about taking your own life, get help right away. You can go to your nearest emergency department or call: ?Your local emergency services (911 in the [place] ?A suicide crisis helpline, such as the [place] at 825-310-2584 or 988 in the [place]. This is open 24 hours a day. ?Summary ?Diabetes (type 1 diabetes mellitus or type 2 diabetes mellitus) is a condition in which the body does not have enough of a hormone called insulin, or the body does not respond properly to insulin. ?Living with diabetes puts you at risk for medical and emotional issues,  such as diabetes distress, depression, and anxiety. ?Recognizing the symptoms of diabetes distress and depression may help you avoid problems with your diabetes control. If you experience symptoms, it is important to discuss this with your health care provider, certified diabetes care and education specialist, or therapist. ?It is  important to start treatment for diabetes distress and depression soon after diagnosis. ?Ask your health care provider to recommend a therapist who understands both depression and diabetes. ?This information is n

## 2021-06-25 ENCOUNTER — Ambulatory Visit (INDEPENDENT_AMBULATORY_CARE_PROVIDER_SITE_OTHER): Payer: Medicare Other | Admitting: Internal Medicine

## 2021-06-25 ENCOUNTER — Encounter: Payer: Self-pay | Admitting: Internal Medicine

## 2021-06-25 VITALS — BP 126/80 | HR 74 | Resp 18 | Ht 66.0 in | Wt 152.8 lb

## 2021-06-25 DIAGNOSIS — M545 Low back pain, unspecified: Secondary | ICD-10-CM

## 2021-06-25 DIAGNOSIS — G8929 Other chronic pain: Secondary | ICD-10-CM | POA: Diagnosis not present

## 2021-06-25 DIAGNOSIS — N184 Chronic kidney disease, stage 4 (severe): Secondary | ICD-10-CM | POA: Diagnosis not present

## 2021-06-25 DIAGNOSIS — E039 Hypothyroidism, unspecified: Secondary | ICD-10-CM | POA: Diagnosis not present

## 2021-06-25 DIAGNOSIS — I10 Essential (primary) hypertension: Secondary | ICD-10-CM

## 2021-06-25 DIAGNOSIS — Z0000 Encounter for general adult medical examination without abnormal findings: Secondary | ICD-10-CM | POA: Diagnosis not present

## 2021-06-25 DIAGNOSIS — E1122 Type 2 diabetes mellitus with diabetic chronic kidney disease: Secondary | ICD-10-CM

## 2021-06-25 LAB — LIPID PANEL
Cholesterol: 125 mg/dL (ref 0–200)
HDL: 26.3 mg/dL — ABNORMAL LOW (ref >39.00)
NonHDL: 98.89
Total CHOL/HDL Ratio: 5
Triglycerides: 283 mg/dL — ABNORMAL HIGH (ref 0.0–149.0)
VLDL: 56.6 mg/dL — ABNORMAL HIGH (ref 0.0–40.0)

## 2021-06-25 LAB — CBC
HCT: 34 % — ABNORMAL LOW (ref 36.0–46.0)
Hemoglobin: 11.3 g/dL — ABNORMAL LOW (ref 12.0–15.0)
MCHC: 33.2 g/dL (ref 30.0–36.0)
MCV: 94.3 fl (ref 78.0–100.0)
Platelets: 240 10*3/uL (ref 150.0–400.0)
RBC: 3.6 Mil/uL — ABNORMAL LOW (ref 3.87–5.11)
RDW: 13.8 % (ref 11.5–15.5)
WBC: 5.5 10*3/uL (ref 4.0–10.5)

## 2021-06-25 LAB — MICROALBUMIN / CREATININE URINE RATIO
Creatinine,U: 61.4 mg/dL
Microalb Creat Ratio: 26.5 mg/g (ref 0.0–30.0)
Microalb, Ur: 16.3 mg/dL — ABNORMAL HIGH (ref 0.0–1.9)

## 2021-06-25 LAB — TSH: TSH: 3.59 u[IU]/mL (ref 0.35–5.50)

## 2021-06-25 LAB — COMPREHENSIVE METABOLIC PANEL
ALT: 11 U/L (ref 0–35)
AST: 20 U/L (ref 0–37)
Albumin: 4.1 g/dL (ref 3.5–5.2)
Alkaline Phosphatase: 58 U/L (ref 39–117)
BUN: 34 mg/dL — ABNORMAL HIGH (ref 6–23)
CO2: 22 mEq/L (ref 19–32)
Calcium: 9.7 mg/dL (ref 8.4–10.5)
Chloride: 107 mEq/L (ref 96–112)
Creatinine, Ser: 1.83 mg/dL — ABNORMAL HIGH (ref 0.40–1.20)
GFR: 24.35 mL/min — ABNORMAL LOW (ref >60.00)
Glucose, Bld: 167 mg/dL — ABNORMAL HIGH (ref 70–99)
Potassium: 4.3 mEq/L (ref 3.5–5.1)
Sodium: 138 mEq/L (ref 135–145)
Total Bilirubin: 0.4 mg/dL (ref 0.2–1.2)
Total Protein: 7.3 g/dL (ref 6.0–8.3)

## 2021-06-25 LAB — LDL CHOLESTEROL, DIRECT: Direct LDL: 65 mg/dL

## 2021-06-25 NOTE — Assessment & Plan Note (Signed)
Using tylenol otc and gabapentin 100 mg TID for pain and is well controlled.  ?

## 2021-06-25 NOTE — Assessment & Plan Note (Signed)
BP at goal on amlodipine 5 mg daily. Checking CMP and lipid panel today. ?

## 2021-06-25 NOTE — Progress Notes (Signed)
Subjective:   Patient ID: Jocelyn Mendez, female    DOB: 02/26/1930, 88 y.o.   MRN: [id number]  HPI Here for medicare wellness and physical, no new complaints. Please see A/P for status and treatment of chronic medical problems.   Diet: DM since diabetic Physical activity: sedentary Depression/mood screen: negative Hearing: moderate to severe loss Visual acuity: grossly normal, performs annual eye exam  ADLs: capable Fall risk: low uses walker Home safety: good Cognitive evaluation: intact to orientation, naming, recall and repetition EOL planning: adv directives discussed, in place  [employer] Visit from 06/25/2021 in [facility] at [facility] Visit from 06/25/2021 in [facility]  PHQ-9 Total Score 0         12/20/2020    1:45 PM 01/30/2021    2:00 PM 04/30/2021    2:45 PM 06/18/2021    2:15 PM 06/25/2021    1:22 PM  [place] in the past year? 1   0 0  Was there an injury with Fall? 0   0 0  Was there an injury with Fall? - Comments    N/A- no falls reported x 12 months; uses walker "all the time" reports last falls as "May 2022"   Fall Risk Category Calculator 2   0 0  Fall Risk Category Moderate   Low Low  Patient Fall Risk Level High fall risk  High fall risk High fall risk   Patient at Risk for Falls Due to History of fall(s);Impaired mobility;Medication side effect  Impaired mobility;History of fall(s);Medication side effect Impaired mobility;Medication side effect   Fall risk Follow up Falls prevention discussed Falls prevention discussed Falls prevention discussed Falls prevention discussed     I have personally reviewed and have noted 1. The patient's medical and social history - reviewed today no changes 2. Their use of alcohol, tobacco or illicit drugs 3. Their current medications and supplements 4. The patient's functional ability including ADL's, fall risks,  home safety risks and hearing or visual impairment. 5. Diet and physical activities 6. Evidence for depression or mood disorders 7. Care team reviewed and updated 8.  The patient is not on an opioid pain medication.  Patient Care Team: Hoyt Koch, MD as PCP - General (Internal Medicine) Renato Shin, MD as Consulting Physician (Endocrinology) Knox Royalty, RN as Case Manager Past Medical History:  Diagnosis Date   Acute bronchitis    Anemia of other chronic disease    Anxiety state, unspecified    Chest pain, unspecified    Diverticulosis of colon (without mention of hemorrhage)    Esophageal reflux    Gallstones    Generalized osteoarthrosis, unspecified site    Headache(784.0)    Lumbago    Neuropathy in diabetes ([place])    bilat LE's   Osteoporosis    Other and unspecified hyperlipidemia    Other B-complex deficiencies    Type II or unspecified type diabetes mellitus without mention of complication, not stated as uncontrolled    Unspecified disorder resulting from impaired renal function    Unspecified essential hypertension    Unspecified hypothyroidism    Past Surgical History:  Procedure Laterality Date   APPENDECTOMY     THYROIDECTOMY     Family History  Problem Relation Age of Onset   Diabetes Mother    Cancer Father        unsure ?  stomach   Heart attack Brother        x 3   Cancer Sister        unsure type   Lung cancer Brother        smoker   Heart attack Sister    Review of Systems  Constitutional: Negative.   HENT: Negative.    Eyes: Negative.   Respiratory:  Negative for cough, chest tightness and shortness of breath.   Cardiovascular:  Negative for chest pain, palpitations and leg swelling.  Gastrointestinal:  Negative for abdominal distention, abdominal pain, constipation, diarrhea, nausea and vomiting.  Musculoskeletal: Negative.   Skin: Negative.   Neurological: Negative.   Psychiatric/Behavioral: Negative.     Objective:   Physical Exam Constitutional:      Appearance: She is well-developed.  HENT:     Head: Normocephalic and atraumatic.  Cardiovascular:     Rate and Rhythm: Normal rate and regular rhythm.  Pulmonary:     Effort: Pulmonary effort is normal. No respiratory distress.     Breath sounds: Normal breath sounds. No wheezing or rales.  Abdominal:     General: Bowel sounds are normal. There is no distension.     Palpations: Abdomen is soft.     Tenderness: There is no abdominal tenderness. There is no rebound.  Musculoskeletal:     Cervical back: Normal range of motion.  Skin:    General: Skin is warm and dry.  Neurological:     Mental Status: She is alert and oriented to person, place, and time.     Coordination: Coordination abnormal.     Comments: Slow gait    Vitals:   06/25/21 1319  BP: 126/80  Pulse: 74  Resp: 18  SpO2: 98%  Weight: 152 lb 12.8 oz (69.3 kg)  Height: 5\' 6"  (1.676 m)   Assessment & Plan:

## 2021-06-25 NOTE — Assessment & Plan Note (Signed)
Checking CMP today and seeing nephrology regularly. ?

## 2021-06-25 NOTE — Assessment & Plan Note (Signed)
Checking fructosamine today as this is more accurate with her CKD stage 4. Taking humulin 46 units per day. Overall decent control but complicated. Will adjust as needed. Up to date on eye exam. On statin. Checking microalbumin to creatinine ratio. ?

## 2021-06-27 ENCOUNTER — Other Ambulatory Visit: Payer: Self-pay | Admitting: Internal Medicine

## 2021-06-27 ENCOUNTER — Other Ambulatory Visit: Payer: Self-pay | Admitting: Endocrinology

## 2021-06-27 NOTE — Assessment & Plan Note (Signed)
Checking TSH and adjust synthroid 137 mcg daily.

## 2021-06-27 NOTE — Assessment & Plan Note (Signed)
Flu shot yearly. Covid-19 counseled. Pneumonia complete. Shingrix counseled to get at pharmacy. Tetanus counseled to get at pharmacy. Colonoscopy aged out. Mammogram aged out, pap smear aged out and dexa declines. Counseled about sun safety and mole surveillance. Counseled about the dangers of distracted driving. Given 10 year screening recommendations.

## 2021-06-30 LAB — FRUCTOSAMINE: Fructosamine: 318 umol/L — ABNORMAL HIGH (ref 205–285)

## 2021-07-10 DIAGNOSIS — E1169 Type 2 diabetes mellitus with other specified complication: Secondary | ICD-10-CM

## 2021-07-10 DIAGNOSIS — Z794 Long term (current) use of insulin: Secondary | ICD-10-CM | POA: Diagnosis not present

## 2021-07-10 DIAGNOSIS — E785 Hyperlipidemia, unspecified: Secondary | ICD-10-CM

## 2021-07-11 ENCOUNTER — Other Ambulatory Visit: Payer: Self-pay | Admitting: Internal Medicine

## 2021-07-12 ENCOUNTER — Telehealth: Payer: Self-pay | Admitting: Internal Medicine

## 2021-07-12 MED ORDER — LEVOTHYROXINE SODIUM 137 MCG PO TABS: 137.0000 ug | ORAL_TABLET | Freq: Every day | ORAL | 3 refills | Status: DC

## 2021-07-12 NOTE — Telephone Encounter (Signed)
Pt needs a refill on   levothyroxine (SYNTHROID) 137 MCG tablet & busPIRone (BUSPAR) 5 MG tablet  Please send or call in to the [facility].

## 2021-07-12 NOTE — Telephone Encounter (Signed)
Buspar already sent. Pt is up to date sent synthroid to [facility].Marland KitchenJohny Chess

## 2021-07-18 DIAGNOSIS — H353131 Nonexudative age-related macular degeneration, bilateral, early dry stage: Secondary | ICD-10-CM | POA: Diagnosis not present

## 2021-07-18 DIAGNOSIS — H401131 Primary open-angle glaucoma, bilateral, mild stage: Secondary | ICD-10-CM | POA: Diagnosis not present

## 2021-07-18 DIAGNOSIS — E119 Type 2 diabetes mellitus without complications: Secondary | ICD-10-CM | POA: Diagnosis not present

## 2021-07-25 ENCOUNTER — Other Ambulatory Visit: Payer: Self-pay | Admitting: Internal Medicine

## 2021-08-01 ENCOUNTER — Other Ambulatory Visit: Payer: Self-pay | Admitting: Internal Medicine

## 2021-08-23 ENCOUNTER — Ambulatory Visit (INDEPENDENT_AMBULATORY_CARE_PROVIDER_SITE_OTHER): Payer: Medicare Other

## 2021-08-23 ENCOUNTER — Encounter: Payer: Self-pay | Admitting: Internal Medicine

## 2021-08-23 ENCOUNTER — Ambulatory Visit (INDEPENDENT_AMBULATORY_CARE_PROVIDER_SITE_OTHER): Payer: Medicare Other | Admitting: Internal Medicine

## 2021-08-23 VITALS — BP 122/84 | HR 57 | Resp 18 | Ht 66.0 in | Wt 146.0 lb

## 2021-08-23 DIAGNOSIS — M545 Low back pain, unspecified: Secondary | ICD-10-CM

## 2021-08-23 DIAGNOSIS — R509 Fever, unspecified: Secondary | ICD-10-CM

## 2021-08-23 DIAGNOSIS — R531 Weakness: Secondary | ICD-10-CM | POA: Diagnosis not present

## 2021-08-23 DIAGNOSIS — N184 Chronic kidney disease, stage 4 (severe): Secondary | ICD-10-CM | POA: Diagnosis not present

## 2021-08-23 DIAGNOSIS — G8929 Other chronic pain: Secondary | ICD-10-CM | POA: Diagnosis not present

## 2021-08-23 DIAGNOSIS — J9811 Atelectasis: Secondary | ICD-10-CM | POA: Diagnosis not present

## 2021-08-23 LAB — CBC
HCT: 33.9 % — ABNORMAL LOW (ref 36.0–46.0)
Hemoglobin: 11.1 g/dL — ABNORMAL LOW (ref 12.0–15.0)
MCHC: 32.7 g/dL (ref 30.0–36.0)
MCV: 93.9 fl (ref 78.0–100.0)
Platelets: 354 10*3/uL (ref 150.0–400.0)
RBC: 3.61 Mil/uL — ABNORMAL LOW (ref 3.87–5.11)
RDW: 13.6 % (ref 11.5–15.5)
WBC: 6.3 10*3/uL (ref 4.0–10.5)

## 2021-08-23 LAB — URINALYSIS, ROUTINE W REFLEX MICROSCOPIC
Bilirubin Urine: NEGATIVE
Ketones, ur: NEGATIVE
Nitrite: NEGATIVE
Specific Gravity, Urine: 1.025 (ref 1.000–1.030)
Total Protein, Urine: 100 — AB
Urine Glucose: NEGATIVE
Urobilinogen, UA: 0.2 (ref 0.0–1.0)
pH: 5.5 (ref 5.0–8.0)

## 2021-08-23 LAB — HEPATIC FUNCTION PANEL
ALT: 7 U/L (ref 0–35)
AST: 16 U/L (ref 0–37)
Albumin: 3.9 g/dL (ref 3.5–5.2)
Alkaline Phosphatase: 50 U/L (ref 39–117)
Bilirubin, Direct: 0.1 mg/dL (ref 0.0–0.3)
Total Bilirubin: 0.5 mg/dL (ref 0.2–1.2)
Total Protein: 7.6 g/dL (ref 6.0–8.3)

## 2021-08-23 LAB — RENAL FUNCTION PANEL
Albumin: 3.9 g/dL (ref 3.5–5.2)
BUN: 42 mg/dL — ABNORMAL HIGH (ref 6–23)
CO2: 21 mEq/L (ref 19–32)
Calcium: 9.8 mg/dL (ref 8.4–10.5)
Chloride: 106 mEq/L (ref 96–112)
Creatinine, Ser: 2.04 mg/dL — ABNORMAL HIGH (ref 0.40–1.20)
GFR: 21.35 mL/min — ABNORMAL LOW (ref >60.00)
Glucose, Bld: 209 mg/dL — ABNORMAL HIGH (ref 70–99)
Phosphorus: 3 mg/dL (ref 2.3–4.6)
Potassium: 4.3 mEq/L (ref 3.5–5.1)
Sodium: 137 mEq/L (ref 135–145)

## 2021-08-23 NOTE — Progress Notes (Signed)
   Subjective:   Patient ID: Jocelyn Mendez, female    DOB: 12/23/1930, 88 y.o.   MRN: [id number]  HPI The patient is an 88 YO female coming in for sick visit. Last week abrupt weakness and fevers. Did not seek care. Is gradually improving and no fevers for a few days. Son with her. Having worsening low back pain and arthritis.   Review of Systems  Constitutional:  Positive for activity change, appetite change, fatigue, fever and unexpected weight change.  HENT: Negative.    Eyes: Negative.   Respiratory:  Negative for cough, chest tightness and shortness of breath.   Cardiovascular:  Negative for chest pain, palpitations and leg swelling.  Gastrointestinal:  Negative for abdominal distention, abdominal pain, constipation, diarrhea, nausea and vomiting.  Genitourinary:  Positive for dysuria.  Musculoskeletal:  Positive for arthralgias and myalgias.  Skin: Negative.   Neurological:  Positive for weakness.  Psychiatric/Behavioral: Negative.      Objective:  Physical Exam Constitutional:      Appearance: She is well-developed.     Comments: Chronically ill appearing  HENT:     Head: Normocephalic and atraumatic.  Cardiovascular:     Rate and Rhythm: Normal rate and regular rhythm.  Pulmonary:     Effort: Pulmonary effort is normal. No respiratory distress.     Breath sounds: Normal breath sounds. No wheezing or rales.  Abdominal:     General: Bowel sounds are normal. There is no distension.     Palpations: Abdomen is soft.     Tenderness: There is no abdominal tenderness. There is no rebound.  Musculoskeletal:        General: Tenderness present.     Cervical back: Normal range of motion.  Skin:    General: Skin is warm and dry.  Neurological:     Mental Status: She is alert and oriented to person, place, and time.     Motor: Weakness present.     Coordination: Coordination abnormal.     Comments: Weakness both legs equally, arms equal strength     Vitals:   08/23/21  1018  BP: 122/84  Pulse: (!) 57  Resp: 18  SpO2: 98%  Weight: 146 lb (66.2 kg)  Height: 5\' 6"  (1.676 m)    Assessment & Plan:  Visit time 30 minutes in face to face communication with patient and coordination of care, additional 10 minutes spent in record review, coordination or care, ordering tests, communicating/referring to other healthcare professionals, documenting in medical records all on the same day of the visit for total time 40 minutes spent on the visit.

## 2021-08-23 NOTE — Assessment & Plan Note (Signed)
Prior severe UTI. She does have asthma and sob chronically. Checking CBC, CMP, U/A, urine culture and CXR. Given that these have stopped and she is improving slightly may have been viral but needs assessment as this could recur and could be serious. Treat as appropriate.

## 2021-08-23 NOTE — Assessment & Plan Note (Signed)
Advised to take tylenol regularly. Due to CKD stage 4 cannot take NSAIDs. Uses gabapentin and reinforced safe dosing limits today of 100 mg TID prn.

## 2021-08-23 NOTE — Patient Instructions (Signed)
We will check the labs and the chest x-ray today.

## 2021-08-23 NOTE — Assessment & Plan Note (Signed)
Needs CMP today as she has had poor appetite, new weakness and per son less urination (she denies).

## 2021-08-23 NOTE — Assessment & Plan Note (Signed)
New abrupt weakness both legs. Along with fevers making some infection more likely. We discussed stroke as less likely possibility. She is outside window for stroke treatment so we have elected to check for infection and order PT/nursing home health. If no improvement or no explanation on labs will check CT head. In future advised for abrupt weakness seek care immediately.

## 2021-08-25 ENCOUNTER — Other Ambulatory Visit: Payer: Self-pay | Admitting: Internal Medicine

## 2021-08-25 LAB — URINE CULTURE

## 2021-08-26 ENCOUNTER — Other Ambulatory Visit: Payer: Self-pay | Admitting: Internal Medicine

## 2021-08-26 MED ORDER — CEPHALEXIN 250 MG PO CAPS: 250.0000 mg | ORAL_CAPSULE | Freq: Two times a day (BID) | ORAL | 0 refills | Status: AC

## 2021-09-04 ENCOUNTER — Other Ambulatory Visit: Payer: Self-pay | Admitting: Endocrinology

## 2021-09-04 ENCOUNTER — Other Ambulatory Visit (INDEPENDENT_AMBULATORY_CARE_PROVIDER_SITE_OTHER): Payer: Medicare Other

## 2021-09-04 DIAGNOSIS — N183 Chronic kidney disease, stage 3 unspecified: Secondary | ICD-10-CM | POA: Diagnosis not present

## 2021-09-04 DIAGNOSIS — Z794 Long term (current) use of insulin: Secondary | ICD-10-CM

## 2021-09-04 DIAGNOSIS — E1122 Type 2 diabetes mellitus with diabetic chronic kidney disease: Secondary | ICD-10-CM

## 2021-09-04 LAB — GLUCOSE, RANDOM: Glucose, Bld: 105 mg/dL — ABNORMAL HIGH (ref 70–99)

## 2021-09-04 LAB — HEMOGLOBIN A1C: Hgb A1c MFr Bld: 7.2 % — ABNORMAL HIGH (ref 4.6–6.5)

## 2021-09-09 ENCOUNTER — Encounter: Payer: Self-pay | Admitting: Endocrinology

## 2021-09-09 ENCOUNTER — Ambulatory Visit (INDEPENDENT_AMBULATORY_CARE_PROVIDER_SITE_OTHER): Payer: Medicare Other | Admitting: Endocrinology

## 2021-09-09 VITALS — BP 140/52 | HR 72 | Ht 66.5 in | Wt 145.6 lb

## 2021-09-09 DIAGNOSIS — E1165 Type 2 diabetes mellitus with hyperglycemia: Secondary | ICD-10-CM

## 2021-09-09 DIAGNOSIS — N184 Chronic kidney disease, stage 4 (severe): Secondary | ICD-10-CM | POA: Diagnosis not present

## 2021-09-09 DIAGNOSIS — Z794 Long term (current) use of insulin: Secondary | ICD-10-CM

## 2021-09-09 MED ORDER — INSULIN LISPRO (1 UNIT DIAL) 100 UNIT/ML (KWIKPEN): PEN_INJECTOR | SUBCUTANEOUS | 1 refills | Status: DC

## 2021-09-09 NOTE — Progress Notes (Signed)
Patient ID: Jocelyn Mendez, female   DOB: [birth year], 88 y.o.   MRN: [id number]           Reason for Appointment: Type II Diabetes follow-up   History of Present Illness   Diagnosis date: 1988  Previous history:  Non-insulin hypoglycemic drugs previously used: Unknown Insulin was started in 2012, she has been on NPH, Lantus, Levemir in the past  A1c range in the last few years is: 5.7-9.2  Recent history:     Non-insulin hypoglycemic drugs: None     Insulin regimen: Humulin NPH 46 units in a.m.    Side effects from medications: None  Current self management, blood sugar patterns and problems identified:  A1c is 7.2 compared to 7% in April She forgot her meter and blood sugars are obtained by recall She is also having some difficulty hearing and her husband was helping with the history today Although she has variable mealtimes and waking up time she usually takes her NPH insulin in the morning before eating breakfast Her lab glucose was 105 early afternoon but she had not eaten lunch However she thinks that her blood sugars are usually at least 175 and as much as 280 after lunch and usually over 200 after dinner/bedtime She feels that she has periodic hypoglycemia waking up in the morning with blood sugars as low as 46 and it takes a long time to bring the blood sugar out the low range with juice and snacks and use a week She has been on the same insulin dose for some time  Exercise: Unable to do much Diet management: Has variable mealtimes and variable appetite, generally eating at least 2 starches in the evenings at dinnertime, does not like to eat much meat     Hypoglycemia:  none    Glucometer: One Touch.           Blood Glucose readings from meter download:   PRE-MEAL Fasting Lunch Dinner Bedtime Overall  Glucose range: 46-80   212-247   Mean/median:        POST-MEAL PC Breakfast PC Lunch PC Dinner  Glucose range:  175-280   Mean/median:       Dietician  visit: Most recent: never     Weight control:  Wt Readings from Last 3 Encounters:  09/09/21 145 lb 9.6 oz (66 kg)  08/23/21 146 lb (66.2 kg)  06/25/21 152 lb 12.8 oz (69.3 kg)            Diabetes labs:  Lab Results  Component Value Date   HGBA1C 7.2 (H) 09/04/2021   HGBA1C 7.0 (A) 06/06/2021   HGBA1C 6.5 (A) 03/28/2021   Lab Results  Component Value Date   MICROALBUR 16.3 (H) 06/25/2021   LDLCALC 85 03/19/2011   CREATININE 2.04 (H) 08/23/2021    Lab Results  Component Value Date   FRUCTOSAMINE 318 (H) 06/25/2021   FRUCTOSAMINE 350 (H) 01/22/2021   FRUCTOSAMINE 312 (H) 06/29/2020     Allergies as of 09/09/2021       Reactions   Codeine Other (See Comments)   REACTION: change in mental status   Diphenhydramine Hcl Itching   Quinine Other (See Comments)   REACTION: abnormal sensations in face   Sulfonamide Derivatives Rash        Medication List        Accurate as of September 09, 2021  1:49 PM. If you have any questions, ask your nurse or doctor.  Accu-Chek Guide Me w/Device Kit 1 each by Does not apply route 4 (four) times daily. E11.9   acetaminophen 500 MG tablet Commonly known as: TYLENOL Take 500 mg by mouth every 6 (six) hours as needed.   Adjustable Lancing Device Misc Apply 1 each topically daily.   albuterol 108 (90 Base) MCG/ACT inhaler Commonly known as: VENTOLIN HFA Inhale 2 puffs into the lungs every 6 (six) hours as needed for wheezing or shortness of breath.   amLODipine 5 MG tablet Commonly known as: NORVASC TAKE 1 TABLET BY MOUTH EVERY DAY   ascorbic acid 500 MG tablet Commonly known as: VITAMIN C Take 1 tablet (500 mg total) by mouth daily.   aspirin 81 MG tablet Take 81 mg by mouth daily.   busPIRone 5 MG tablet Commonly known as: BUSPAR TAKE 1 TABLET BY MOUTH TWICE DAILY   cyanocobalamin 1000 MCG/ML injection Commonly known as: VITAMIN B12 INJECT ONE ML INTRAMUSCULARLY EVERY MONTH   fenofibrate 160 MG  tablet TAKE 1 TABLET BY MOUTH DAILY   ferrous sulfate 325 (65 FE) MG EC tablet Take 1 tablet (325 mg total) by mouth daily with breakfast.   gabapentin 100 MG capsule Commonly known as: NEURONTIN TAKE ONE CAPSULE BY MOUTH THREE TIMES DAILY   HumuLIN N KwikPen 100 UNIT/ML Kiwkpen Generic drug: Insulin NPH (Human) ([place]) INJECT 54 UNITS INTO THE SKIN EVERY MORNING   insulin lispro 100 UNIT/ML KwikPen Commonly known as: HumaLOG KwikPen 6 units before dinner, max dose 10 units Started by: Elayne Snare, MD   iron polysaccharides 150 MG capsule Commonly known as: Nu-Iron Take 1 capsule (150 mg total) by mouth daily.   latanoprost 0.005 % ophthalmic solution Commonly known as: XALATAN Place 1 drop into both eyes at bedtime.   levothyroxine 137 MCG tablet Commonly known as: SYNTHROID Take 1 tablet (137 mcg total) by mouth daily before breakfast.   lidocaine 5 % Commonly known as: LIDODERM APPLY ONE PATCH TO SKIN EVERY DAY. REMOVE AND DISCARD PATCH WITHIN 12 HOURS OR AS DIRECTED BY MD - What changed: See the new instructions.   loperamide 2 MG tablet Commonly known as: IMODIUM A-D Take 2 mg by mouth 4 (four) times daily as needed for diarrhea or loose stools.   NEEDLE (DISP) 25 G 25G X 5/8" Misc Commonly known as: BD Disp Needles Use as directed to administer  the b12 injection monthly   OneTouch Verio test strip Generic drug: glucose blood 1 each by Other route 2 (two) times daily. And lancets 2/day   rosuvastatin 20 MG tablet Commonly known as: CRESTOR TAKE 1 TABLET BY MOUTH DAILY - ANNUAL APPOINTMENT DUE FOR LABS, MUST SEE PROVIDER FOR MORE REFILLS   saccharomyces boulardii 250 MG capsule Commonly known as: FLORASTOR Take 1 capsule (250 mg total) by mouth 2 (two) times daily.   Vitamin D3 50 MCG (2000 UT) Tabs Take 2,000 Units by mouth 2 (two) times daily.   zinc sulfate 220 (50 Zn) MG capsule Take 1 capsule (220 mg total) by mouth daily.         Allergies:  Allergies  Allergen Reactions   Codeine Other (See Comments)    REACTION: change in mental status   Diphenhydramine Hcl Itching   Quinine Other (See Comments)    REACTION: abnormal sensations in face   Sulfonamide Derivatives Rash    Past Medical History:  Diagnosis Date   Acute bronchitis    Anemia of other chronic disease    Anxiety state, unspecified  Chest pain, unspecified    Diverticulosis of colon (without mention of hemorrhage)    Esophageal reflux    Gallstones    Generalized osteoarthrosis, unspecified site    Headache(784.0)    Lumbago    Neuropathy in diabetes ([place])    bilat LE's   Osteoporosis    Other and unspecified hyperlipidemia    Other B-complex deficiencies    Type II or unspecified type diabetes mellitus without mention of complication, not stated as uncontrolled    Unspecified disorder resulting from impaired renal function    Unspecified essential hypertension    Unspecified hypothyroidism     Past Surgical History:  Procedure Laterality Date   APPENDECTOMY     THYROIDECTOMY      Family History  Problem Relation Age of Onset   Diabetes Mother    Cancer Father        unsure ? stomach   Heart attack Brother        x 3   Cancer Sister        unsure type   Lung cancer Brother        smoker   Heart attack Sister     Social History:  reports that she quit smoking about 48 years ago. Her smoking use included cigarettes. She has a 3.00 pack-year smoking history. She quit smokeless tobacco use about 11 years ago.  Her smokeless tobacco use included snuff. She reports that she does not drink alcohol and does not use drugs.  Review of Systems:  Last diabetic eye exam date/22, followed for glaucoma  Last foot exam date: 5/23 Followed by podiatrist  Hypothyroidism managed by her PCP   Lab Results  Component Value Date   TSH 3.59 06/25/2021   TSH 2.23 02/22/2020   TSH 15.72 (H) 08/01/2019   FREET4 1.04 02/22/2020    FREET4 0.93 08/01/2019   FREET4 0.77 03/03/2019    Hypertension:   She is on amlodipine  BP Readings from Last 3 Encounters:  09/09/21 (!) 140/52  08/23/21 122/84  06/25/21 126/80    Lipid management: Treated by her PCP with Crestor 20 mg daily    Lab Results  Component Value Date   CHOL 125 06/25/2021   CHOL 134 03/03/2019   CHOL 127 03/05/2017   Lab Results  Component Value Date   HDL 26.30 (L) 06/25/2021   HDL 30.40 (L) 03/03/2019   HDL 31.30 (L) 03/05/2017   Lab Results  Component Value Date   LDLCALC 85 03/19/2011   LDLCALC 73 09/23/2006   Lab Results  Component Value Date   TRIG 283.0 (H) 06/25/2021   TRIG 233.0 (H) 03/03/2019   TRIG 219.0 (H) 03/05/2017   Lab Results  Component Value Date   CHOLHDL 5 06/25/2021   CHOLHDL 4 03/03/2019   CHOLHDL 4 03/05/2017   Lab Results  Component Value Date   LDLDIRECT 65.0 06/25/2021   LDLDIRECT 72.0 03/03/2019   LDLDIRECT 68.0 03/05/2017     Examination:   BP (!) 140/52   Pulse 72   Ht 5' 6.5" (1.689 m)   Wt 145 lb 9.6 oz (66 kg)   SpO2 (!) 88%   BMI 23.15 kg/m   Body mass index is 23.15 kg/m.    ASSESSMENT/ PLAN:    Diabetes type 2:   Current regimen: NPH insulin once a day in the morning  See history of present illness for detailed discussion of current diabetes management, blood sugar patterns and problems identified  A1c is 7.2  which may be adequate for her considering her age and duration of diabetes However this may be influenced by her renal function  Although she did not bring her meter she reports readings over 200 consistently after meals especially lunch and dinner Also has not had any diabetes education is unlikely that she is getting balanced meals Not clear why she has recent decline in her weight also   Recommendations:  Today discussed in detail the need for mealtime insulin to cover postprandial spikes, action of mealtime insulin, use of the insulin pen, timing and action of  the rapid acting insulin as well as starting dose and dosage titration to target the two-hour reading of under 180 She will start HUMALOG at dinnertime and given her a chart which explains how this would be taken as well as the target blood sugars She will also increase the dose by 2 units if blood sugars after dinner stay over at least 200 with a target of 180 ideally Advised her to make sure she has some kind of protein either animal source for daily to have at least each meal especially dinnertime To reduce chances of hypoglycemia in the mornings she will reduce her NPH insulin to 40 units but advised her that if she has lower readings in the mornings she will need to let us know and we will reduce this further She will be scheduled to see the diabetes educator in about 2 to 3 weeks and follow-up in about 2 months She is a good candidate for continued glucose monitoring but she was reluctant to use the freestyle libre despite explaining how this would be used and benefits General medical management to be continued by her PCP  CKD: She does not appear to be seen by nephrologist Would consider adding Wilder Glade or Jardiance on the next visit if this is affordable  Hypertriglyceridemia: Will need follow-up fasting labs    Patient Instructions  Check blood sugars on waking up 3 days a week  Also check blood sugars about 2 hours after meals and do this after different meals by rotation  Recommended blood sugar levels on waking up are 90-130 and about 2 hours after meal is 130-160  Please bring your blood sugar monitor to each visit, thank you  Take 6 units fast insulin just before supper  If sugar 2 hours later is staying > 180 go to 8 units    Elayne Snare 09/09/2021, 1:49 PM

## 2021-09-09 NOTE — Patient Instructions (Signed)
Check blood sugars on waking up 3 days a week  Also check blood sugars about 2 hours after meals and do this after different meals by rotation  Recommended blood sugar levels on waking up are 90-130 and about 2 hours after meal is 130-160  Please bring your blood sugar monitor to each visit, thank you  Take 6 units fast insulin just before supper  If sugar 2 hours later is staying > 180 go to 8 units

## 2021-09-16 ENCOUNTER — Ambulatory Visit: Payer: Medicare Other | Admitting: *Deleted

## 2021-09-16 DIAGNOSIS — N184 Chronic kidney disease, stage 4 (severe): Secondary | ICD-10-CM

## 2021-09-16 DIAGNOSIS — E1169 Type 2 diabetes mellitus with other specified complication: Secondary | ICD-10-CM

## 2021-09-16 NOTE — Chronic Care Management (AMB) (Signed)
Care Management    RN Visit Note  09/16/2021 Name: Jocelyn Mendez MRN: [id number] DOB: 1930-10-04  Subjective: Jocelyn Mendez is a 88 y.o. year old female who is a primary care patient of Hoyt Koch, MD. The care management team was consulted for assistance with disease management and care coordination needs.    Engaged with patient' daughter, Jocelyn Mendez on [facility] DPR by telephone for follow up visit in response to provider referral for case management and/or care coordination services.   Consent to Services:   Ms. Maestas was given information about Care Management services 12/03/20 including:  Care Management services includes personalized support from designated clinical staff supervised by her physician, including individualized plan of care and coordination with other care providers 24/7 contact phone numbers for assistance for urgent and routine care needs. The patient may stop case management services at any time by phone call to the office staff.  Patient agreed to services and consent obtained.   Assessment: Review of patient past medical history, allergies, medications, health status, including review of consultants reports, laboratory and other test data, was performed as part of comprehensive evaluation and provision of chronic care management services.  Care Plan  Allergies  Allergen Reactions   Codeine Other (See Comments)    REACTION: change in mental status   Diphenhydramine Hcl Itching   Quinine Other (See Comments)    REACTION: abnormal sensations in face   Sulfonamide Derivatives Rash   Outpatient Encounter Medications as of 09/16/2021  Medication Sig Note   acetaminophen (TYLENOL) 500 MG tablet Take 500 mg by mouth every 6 (six) hours as needed.    albuterol (PROVENTIL HFA;VENTOLIN HFA) 108 (90 BASE) MCG/ACT inhaler Inhale 2 puffs into the lungs every 6 (six) hours as needed for wheezing or shortness of breath.    amLODipine (NORVASC) 5 MG tablet TAKE 1  TABLET BY MOUTH EVERY DAY    ascorbic acid (VITAMIN C) 500 MG tablet Take 1 tablet (500 mg total) by mouth daily.    aspirin 81 MG tablet Take 81 mg by mouth daily.    Blood Glucose Monitoring Suppl (ACCU-CHEK GUIDE ME) w/Device KIT 1 each by Does not apply route 4 (four) times daily. E11.9    busPIRone (BUSPAR) 5 MG tablet TAKE 1 TABLET BY MOUTH TWICE DAILY    Cholecalciferol (VITAMIN D3) 2000 UNITS TABS Take 2,000 Units by mouth 2 (two) times daily.     cyanocobalamin (,VITAMIN B-12,) 1000 MCG/ML injection INJECT ONE ML INTRAMUSCULARLY EVERY MONTH    fenofibrate 160 MG tablet TAKE 1 TABLET BY MOUTH DAILY    ferrous sulfate 325 (65 FE) MG EC tablet Take 1 tablet (325 mg total) by mouth daily with breakfast.    gabapentin (NEURONTIN) 100 MG capsule TAKE ONE CAPSULE BY MOUTH THREE TIMES DAILY    glucose blood (ONETOUCH VERIO) test strip 1 each by Other route 2 (two) times daily. And lancets 2/day    HUMULIN N KWIKPEN 100 UNIT/ML KwikPen INJECT 54 UNITS INTO THE SKIN EVERY MORNING 04/30/2021: 04/30/21- reports dose was decreased to 46 U QD by endocrinologist 03/28/21   insulin lispro (HUMALOG KWIKPEN) 100 UNIT/ML KwikPen 6 units before dinner, max dose 10 units    iron polysaccharides (NU-IRON) 150 MG capsule Take 1 capsule (150 mg total) by mouth daily.    Lancet Devices (ADJUSTABLE LANCING DEVICE) MISC Apply 1 each topically daily.    latanoprost (XALATAN) 0.005 % ophthalmic solution Place 1 drop into both eyes at bedtime.  levothyroxine (SYNTHROID) 137 MCG tablet Take 1 tablet (137 mcg total) by mouth daily before breakfast.    lidocaine (LIDODERM) 5 % APPLY ONE PATCH TO SKIN EVERY DAY. REMOVE AND DISCARD PATCH WITHIN 12 HOURS OR AS DIRECTED BY MD - (Patient taking differently: Place 1 patch onto the skin daily.) 11/12/2018: Needs refilled   loperamide (IMODIUM A-D) 2 MG tablet Take 2 mg by mouth 4 (four) times daily as needed for diarrhea or loose stools.    NEEDLE, DISP, 25 G (BD DISP NEEDLES)  25G X 5/8" MISC Use as directed to administer  the b12 injection monthly    rosuvastatin (CRESTOR) 20 MG tablet TAKE 1 TABLET BY MOUTH DAILY - ANNUAL APPOINTMENT DUE FOR LABS, MUST SEE PROVIDER FOR MORE REFILLS    saccharomyces boulardii (FLORASTOR) 250 MG capsule Take 1 capsule (250 mg total) by mouth 2 (two) times daily.    zinc sulfate 220 (50 Zn) MG capsule Take 1 capsule (220 mg total) by mouth daily.    No facility-administered encounter medications on file as of 09/16/2021.   Patient Active Problem List   Diagnosis Date Noted   Weakness 08/23/2021   Pain due to onychomycosis of toenails of both feet 02/26/2021   Iron deficiency 12/05/2020   Elevated AST (SGOT) 11/20/2020   Urge incontinence 11/20/2020   Routine general medical examination at a health care facility 03/05/2019   Left breast lump 12/08/2017   Allergic rhinitis 12/08/2017   Fever 08/27/2016   Asthma 08/27/2016   Diabetes ([place]) 03/28/2015   Gait abnormality 05/13/2010   B12 deficiency 11/23/2007   Anemia, chronic disease 01/21/2007   CKD (chronic kidney disease) stage 4, GFR 15-29 ml/min ([place]) 01/21/2007   Hypothyroidism 11/21/2006   Essential hypertension 11/21/2006   GERD 11/21/2006   DEGENERATIVE JOINT DISEASE, GENERALIZED 11/21/2006   Chronic bilateral low back pain without sciatica 11/21/2006   Osteoporosis 11/21/2006   Conditions to be addressed/monitored: HLD and DMII  Care Plan : RN Care Manager Plan of Care  Updates made by Knox Royalty, RN since 09/16/2021 12:00 AM     Problem: Chronic Disease Management Needs   Priority: High     Long-Range Goal: Ongoing adherence to established plan of care for long term chronic disease management   Start Date: 12/20/2020  Expected End Date: 12/20/2021  Priority: High  Note:   Current Barriers:  Chronic Disease Management support and education needs related to HLD and DMII Hospitalization October 11-13, 2022 for sepsis/ UTI [facility]- no hearing aids yet;  hoping to get soon; 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   Interventions: 1:1 collaboration with primary care provider regarding development and update of comprehensive plan of care as evidenced by provider attestation and co-signature Inter-disciplinary care team collaboration (see longitudinal plan of care) Evaluation of current treatment plan related to  self management and patient's adherence to plan as established by provider Review of patient status, including review of consultants reports, relevant laboratory and other test results, and medications completed 09/16/21: Attempted scheduled outreach visit to patient: left voice message requesting call-back; immediately re-attempted outreach to alternate phone number on file, spoke  with daughter Jocelyn Mendez, on [facility] DPR; [name] reports "as far as I know everything is going fine" with patient; she confirms "no problems that I know of;" and confirms that patient took all of her antibiotics for recently diagnosed UTI 08/23/21; daughter states patient "seems to be doing okay, but she is just still weak;" she requests that I re-attempt outreach to patient "around 3:00 pm later this week" and we scheduled phone call to patient on Wednesday, 09/18/21 at 3:00 per daughter request; daughter is agreeable to making patient aware that I will be contacting her by phone that day  From 06/18/21 outreach: SDOH updated: no new/ unmet concerns identified Depression screening updated: no concerns identified Pain assessment  updated: denies pain today Falls assessment updated: continues to deny new/ recent falls, now x full 12 months- continues using "walker, all the time;"  positive reinforcement provided with encouragement to continue efforts at fall prevention; previously provided education around fall risks/ prevention reinforced Medications discussed: reports daughter continues to assist in medication management: she prepares pill box x one week, then patient takes independently once they are in pill box; she denies current concerns/ issues/ questions around medications; endorses adherence to taking all medications as prescribed Recent dental provider office completed: patient had full teeth extractions and is currently taking antibiotics for same; has appointment to get fitted for dentures "next Monday;" states she tolerated the dental extractions "just fine" Confirmed patient has not yet scheduled PCP provider appointment, as recommended at time of last outreach, states she will do today; reminded patient that she has not seen Dr. Sharlet Salina since June of 2022 Reviewed upcoming scheduled provider appointments: 06/25/21-- prior to close of note- patient had scheduled office visit with PCP; 09/09/21- endocrinology provider; 8/18- podiatry provider; patient confirms is aware of all and has plans to attend as scheduled Discussed with patient her daughter's recent call 06/11/21 to PCP office to report newly developed bedsore: Patient states her daughter was "mistaken;" and states adamantly that she "does not have a bedsore;" she states that the depends that she wears regularly "got folded up and caused a sore spot;" she tells me today that this has now scabbed over and "has healed up completely" States this sore was located "on my hip, near the crack of my butt" Encouraged patient to continue to watch this area and to schedule with PCP should it worsen Discussed plans with patient for ongoing care management follow up and  provided patient with direct contact information for care management team     Hyperlipidemia Interventions:  Goal Status: 06/18/21: on track  [place] on importance of regular laboratory monitoring as prescribed Reviewed importance of limiting foods high in cholesterol Reinforced previously provided education of importance of following heart healthy, low salt, low cholesterol diet: essentially, patient again reports following regular diet but again tells me that she is "still trying to eat better and control portions of things I know I probably should not be eating"  Diabetes Interventions:  Goal Status: 06/18/21: on track  Long Term Goal Provided education to patient about basic DM disease process Counseled on importance of regular laboratory monitoring as prescribed Reviewed recent blood sugars at home: reports fasting blood sugar ranges "all look good;" she apparently does not have her blood sugars recorded at home, or has stopped writing on paper; she tells me that her morning/ fasting blood sugar was "147" today, and adds, "that's about always where they are;" she reports post-prandial values "between 150-170 usually" She denies  frequent episodes of low blood sugar at home, but does admit that "one day last week," she "dropped to 5;" said she "felt terrible, shaky and wobbly;" confirms that this promptly resolved upon eating-- reviewed previously provided education around signs/ symptoms hypoglycemia, along with corresponding action plan: patient continues to benefit from ongoing education/ support/ reinforcement Reviewed recent endocrinology provider appointment 06/06/21- she tells me that she has scheduled follow up office visit with new endocrinologist 09/09/21 Assessed patient's understanding of meaning/ significance of A1-C values: fair baseline understanding of same- Reviewed individual historical A1-C trends and provided education around correlation of A1-C value to blood sugar  levels at home over 3 months Confirmed no changes or concerns around insulin: continues taking 46 U long acting insulin QD/ am hours Confirmed patient reports today attended annual eye/ vision provider office visit "about 3 months ago;" she denies problems, states she "got a good report;" and also obtained new prescription glasses Reinforced previously provided education around dietary management of DMII: again encouraged patient to continue taking small steps to eat right type of food and to focus on portion control-- she tells me today that she is not drinking as much soda as she used to: positive reinforcement provided with encouragement to continue efforts  Lab Results  Component Value Date   HGBA1C 7.0 (A) 06/06/2021  Patient Goals/Self-Care Activities: As evidenced by review of EHR, collaboration with care team, and patient reporting during CCM RN CM outreach,  Patient [place] will: Please make an appointment with Dr. Sharlet Salina for your annual exam; your last office visit with Dr. Sharlet Salina was on July 30, 2020, so your annual exam is due in late June, 2023 Take medications as prescribed Attend all scheduled provider appointments Call pharmacy for medication refills Call provider office for new concerns or questions Continue to check fasting (first thing in the morning, before eating) blood sugars at home- the blood sugar values we reviewed today are in good range Continue trying to follow heart healthy, low salt, low cholesterol, carbohydrate-modified, low sugar diet Continue your efforts to prevent falls- continue using your walker  Follow Up Plan:   Telephone follow up appointment with care management team member scheduled for:  Wednesday, September 18, 2021 at 3:00 pm The patient has been provided with contact information for the care management team and has been advised to call with any health related questions or concerns    Plan: Telephone follow up appointment with care management team  member scheduled for:  Wednesday 09/18/21 at 3:00 pm  Oneta Rack, RN, BSN, [place] [phone]: direct office

## 2021-09-17 ENCOUNTER — Ambulatory Visit: Payer: Medicare Other | Admitting: Nutrition

## 2021-09-18 ENCOUNTER — Ambulatory Visit: Payer: Medicare Other | Admitting: *Deleted

## 2021-09-18 DIAGNOSIS — E1169 Type 2 diabetes mellitus with other specified complication: Secondary | ICD-10-CM

## 2021-09-18 DIAGNOSIS — E1122 Type 2 diabetes mellitus with diabetic chronic kidney disease: Secondary | ICD-10-CM

## 2021-09-18 NOTE — Chronic Care Management (AMB) (Signed)
Care Management    RN Visit Note  09/18/2021 Name: Jocelyn Mendez MRN: [id number] DOB: 12/01/1930  Subjective: Jocelyn Mendez is a 88 y.o. year old female who is a primary care patient of Jocelyn Koch, MD. The care management team was consulted for assistance with disease management and care coordination needs.    Engaged with patient by telephone for follow up visit/ RN CM case closure in response to provider referral for case management and/or care coordination services.   Consent to Services:   Ms. Jocelyn Mendez was given information about Care Management services 12/03/20 including:  Care Management services includes personalized support from designated clinical staff supervised by her physician, including individualized plan of care and coordination with other care providers 24/7 contact phone numbers for assistance for urgent and routine care needs. The patient may stop case management services at any time by phone call to the office staff.  Patient agreed to services and consent obtained.   Assessment: Review of patient past medical history, allergies, medications, health status, including review of consultants reports, laboratory and other test data, was performed as part of comprehensive evaluation and provision of chronic care management services.   SDOH (Social Determinants of Health) assessments and interventions performed:  SDOH Interventions    Flowsheet Row Most Recent Value  SDOH Interventions   Food Insecurity Interventions Intervention Not Indicated  Transportation Interventions Intervention Not Indicated  [family continues to provide transportation]     Care Plan  Allergies  Allergen Reactions   Codeine Other (See Comments)    REACTION: change in mental status   Diphenhydramine Hcl Itching   Quinine Other (See Comments)    REACTION: abnormal sensations in face   Sulfonamide Derivatives Rash   Outpatient Encounter Medications as of 09/18/2021   Medication Sig Note   acetaminophen (TYLENOL) 500 MG tablet Take 500 mg by mouth every 6 (six) hours as needed.    albuterol (PROVENTIL HFA;VENTOLIN HFA) 108 (90 BASE) MCG/ACT inhaler Inhale 2 puffs into the lungs every 6 (six) hours as needed for wheezing or shortness of breath.    amLODipine (NORVASC) 5 MG tablet TAKE 1 TABLET BY MOUTH EVERY DAY    ascorbic acid (VITAMIN C) 500 MG tablet Take 1 tablet (500 mg total) by mouth daily.    aspirin 81 MG tablet Take 81 mg by mouth daily.    Blood Glucose Monitoring Suppl (ACCU-CHEK GUIDE ME) w/Device KIT 1 each by Does not apply route 4 (four) times daily. E11.9    busPIRone (BUSPAR) 5 MG tablet TAKE 1 TABLET BY MOUTH TWICE DAILY    Cholecalciferol (VITAMIN D3) 2000 UNITS TABS Take 2,000 Units by mouth 2 (two) times daily.     cyanocobalamin (,VITAMIN B-12,) 1000 MCG/ML injection INJECT ONE ML INTRAMUSCULARLY EVERY MONTH    fenofibrate 160 MG tablet TAKE 1 TABLET BY MOUTH DAILY    ferrous sulfate 325 (65 FE) MG EC tablet Take 1 tablet (325 mg total) by mouth daily with breakfast.    gabapentin (NEURONTIN) 100 MG capsule TAKE ONE CAPSULE BY MOUTH THREE TIMES DAILY    glucose blood (ONETOUCH VERIO) test strip 1 each by Other route 2 (two) times daily. And lancets 2/day    HUMULIN N KWIKPEN 100 UNIT/ML KwikPen INJECT 54 UNITS INTO THE SKIN EVERY MORNING 04/30/2021: 04/30/21- reports dose was decreased to 46 U QD by endocrinologist 03/28/21   insulin lispro (HUMALOG KWIKPEN) 100 UNIT/ML KwikPen 6 units before dinner, max dose 10 units  iron polysaccharides (NU-IRON) 150 MG capsule Take 1 capsule (150 mg total) by mouth daily.    Lancet Devices (ADJUSTABLE LANCING DEVICE) MISC Apply 1 each topically daily.    latanoprost (XALATAN) 0.005 % ophthalmic solution Place 1 drop into both eyes at bedtime.    levothyroxine (SYNTHROID) 137 MCG tablet Take 1 tablet (137 mcg total) by mouth daily before breakfast.    lidocaine (LIDODERM) 5 % APPLY ONE PATCH TO SKIN  EVERY DAY. REMOVE AND DISCARD PATCH WITHIN 12 HOURS OR AS DIRECTED BY MD - (Patient taking differently: Place 1 patch onto the skin daily.) 11/12/2018: Needs refilled   loperamide (IMODIUM A-D) 2 MG tablet Take 2 mg by mouth 4 (four) times daily as needed for diarrhea or loose stools.    NEEDLE, DISP, 25 G (BD DISP NEEDLES) 25G X 5/8" MISC Use as directed to administer  the b12 injection monthly    rosuvastatin (CRESTOR) 20 MG tablet TAKE 1 TABLET BY MOUTH DAILY - ANNUAL APPOINTMENT DUE FOR LABS, MUST SEE PROVIDER FOR MORE REFILLS    saccharomyces boulardii (FLORASTOR) 250 MG capsule Take 1 capsule (250 mg total) by mouth 2 (two) times daily.    zinc sulfate 220 (50 Zn) MG capsule Take 1 capsule (220 mg total) by mouth daily.    No facility-administered encounter medications on file as of 09/18/2021.   Patient Active Problem List   Diagnosis Date Noted   Weakness 08/23/2021   Pain due to onychomycosis of toenails of both feet 02/26/2021   Iron deficiency 12/05/2020   Elevated AST (SGOT) 11/20/2020   Urge incontinence 11/20/2020   Routine general medical examination at a health care facility 03/05/2019   Left breast lump 12/08/2017   Allergic rhinitis 12/08/2017   Fever 08/27/2016   Asthma 08/27/2016   Diabetes ([place]) 03/28/2015   Gait abnormality 05/13/2010   B12 deficiency 11/23/2007   Anemia, chronic disease 01/21/2007   CKD (chronic kidney disease) stage 4, GFR 15-29 ml/min ([employer]) 01/21/2007   Hypothyroidism 11/21/2006   Essential hypertension 11/21/2006   GERD 11/21/2006   DEGENERATIVE JOINT DISEASE, GENERALIZED 11/21/2006   Chronic bilateral low back pain without sciatica 11/21/2006   Osteoporosis 11/21/2006   Conditions to be addressed/monitored: HLD and DMII  Care Plan : RN Care Manager Plan of Care  Updates made by Knox Royalty, RN since 09/18/2021 12:00 AM     Problem: Chronic Disease Management Needs   Priority: High     Long-Range Goal: Ongoing adherence to  established plan of care for long term chronic disease management   Start Date: 12/20/2020  Expected End Date: 12/20/2021  Priority: High  Note:   Current Barriers:  Chronic Disease Management support and education needs related to HLD and DMII Hospitalization October 11-13, 2022 for sepsis/ UTI [facility]- no hearing aids yet; hoping to get soon; reports working with insurance company 09/18/21: reports has appointment 10/02/21 to get hearing aids Multiple falls without serious injury: High Fall Risk; uses walker regularly; frequent episodes hypoglycemia; reports last fall as "May of 2022" 06/18/21: continues to deny falls since "last May;" positive reinforcement provided with encouragement to continue efforts at fall prevention 09/18/21: continues to deny falls x > 12 months; continues using walker regularly  RNCM Clinical Goal(s):  Patient will demonstrate Ongoing health management independence DMII; HLD  through collaboration with RN Care manager, provider, and care team.   Interventions: 1:1 collaboration with primary care provider regarding development and update of comprehensive plan of care as evidenced by provider  attestation and co-signature Inter-disciplinary care team collaboration (see longitudinal plan of care) Evaluation of current treatment plan related to  self management and patient's adherence to plan as established by provider Review of patient status, including review of consultants reports, relevant laboratory and other test results, and medications completed SDOH updated: no new/ unmet concerns identified Pain assessment updated: denies pain today Falls assessment updated: continues to deny new/ recent falls, x > 12 months- continues using "walker, all the time;"  positive reinforcement provided with encouragement to continue efforts at fall prevention; previously provided education around fall risks/ prevention reinforced Medications discussed: reports daughter continues to  assist in medication management: she prepares pill box x one week, then patient takes independently once they are in pill box; she denies current concerns/ issues/ questions around medications; endorses adherence to taking all medications as prescribed Dental provider office today: "on way home" she had full teeth extractions in spring and is in process of getting dentures; had moldings taken today Reviewed recent PCP office visit 08/23/21: patient confirms she took antibiotics as prescribed and states "feeling better;" continues to report intermittent "weak feeling;" however, she believes this is  "getting better every day" Reviewed upcoming scheduled provider appointments: 09/17/21- DEC; 8/18- podiatry provider; patient confirms is aware of all and has plans to attend as scheduled Discussed plans with patient for ongoing care management follow up- patient denies current care coordination/ care management needs and is agreeable to RN CM case closure today; verbalizes understanding to contact PCP or other care providers for any needs that arise in the future, and confirms she has contact information for all care providers     Hyperlipidemia Interventions:  Goal Status: 09/18/21: goal met  Long Term Goal Counseled on importance of regular laboratory monitoring as prescribed Reviewed importance of limiting foods high in cholesterol Reinforced previously provided education of importance of following heart healthy, low salt, low cholesterol diet: she reports "I try to eat as healthy as I can, but I do like country cooking"  Diabetes Interventions:  Goal Status: 09/18/21: goal met  Long Term Goal Provided education to patient about basic DM disease process Counseled on importance of regular laboratory monitoring as prescribed Review of patient status, including review of consultants reports, relevant laboratory and other test results, and medications completed Assessed social determinant of health  barriers Reviewed recent endocrinology provider office visit 09/09/21: reports continues taking 46 U long acting insulin QD and now taking recently prescribed Lispr 6 U with evening meal-- she is able to verbalize accurate understanding of insulin dosing without prompting Reviewed recent blood sugars at home: reports fasting blood sugar ranges "all look good after they switched around my insulin doses" reports consistent fasting blood sugars between 110-130 and consistent post-prandial blood sugars "between 140-180" Denies recent low blood sugar values at home: states "the change the doctor made at the end of July has stopped that from happening;" reinforced previously provided education around signs/ symptoms hypoglycemia, along with corresponding action plan Reinforced previously provided education around dietary management of DMII: again encouraged patient to continue taking small steps to eat right type of food and to focus on portion control-- she tells me again today that she is not drinking as much soda as she used to and believes she is eating smaller portions: positive reinforcement provided with encouragement to continue efforts  Lab Results  Component Value Date   HGBA1C 7.2 (H) 09/04/2021     Plan:  No further follow up required: patient denies current  care coordination/ care management needs and is agreeable to Clinic RN CM case closure today; RN CM case closure     Oneta Rack, RN, BSN, [place] [phone]: direct office

## 2021-09-23 ENCOUNTER — Other Ambulatory Visit: Payer: Self-pay | Admitting: Internal Medicine

## 2021-09-27 ENCOUNTER — Ambulatory Visit (INDEPENDENT_AMBULATORY_CARE_PROVIDER_SITE_OTHER): Payer: Medicare Other | Admitting: Podiatry

## 2021-09-27 ENCOUNTER — Encounter: Payer: Self-pay | Admitting: Podiatry

## 2021-09-27 DIAGNOSIS — N184 Chronic kidney disease, stage 4 (severe): Secondary | ICD-10-CM

## 2021-09-27 DIAGNOSIS — M79674 Pain in right toe(s): Secondary | ICD-10-CM

## 2021-09-27 DIAGNOSIS — B351 Tinea unguium: Secondary | ICD-10-CM

## 2021-09-27 DIAGNOSIS — M79675 Pain in left toe(s): Secondary | ICD-10-CM

## 2021-09-27 DIAGNOSIS — N1832 Chronic kidney disease, stage 3b: Secondary | ICD-10-CM

## 2021-09-27 DIAGNOSIS — E1122 Type 2 diabetes mellitus with diabetic chronic kidney disease: Secondary | ICD-10-CM

## 2021-09-27 NOTE — Progress Notes (Signed)
This patient returns to my office for at risk foot care.  This patient requires this care by a professional since this patient will be at risk due to having diabetes and CKD.    This patient is unable to cut nails herself since the patient cannot reach her nails.These nails are painful walking and wearing shoes.  This patient presents for at risk foot care today.  General Appearance  Alert, conversant and in no acute stress.  Vascular  Dorsalis pedis and posterior tibial  pulses are  weakly palpable  bilaterally.  Capillary return is within normal limits  bilaterally. Cold feet bilaterally.  Absent digital hair noted.  Neurologic  Senn-Weinstein monofilament wire test within normal limits / diminished bilaterally. Muscle power within normal limits bilaterally.  Nails Thick disfigured discolored nails with subungual debris  from hallux to fifth toes bilaterally. No evidence of bacterial infection or drainage bilaterally.  Orthopedic  No limitations of motion  feet .  No crepitus or effusions noted.  No bony pathology or digital deformities noted.  Skin  normotropic skin with no porokeratosis noted bilaterally.  No signs of infections or ulcers noted.     Onychomycosis  Pain in right toes  Pain in left toes  Consent was obtained for treatment procedures.   Mechanical debridement of nails 1-5  bilaterally performed with a nail nipper.  Filed with dremel without incident.    Return office visit   4   months                  Told patient to return for periodic foot care and evaluation due to potential at risk complications.   Gardiner Barefoot DPM

## 2021-10-02 DIAGNOSIS — H905 Unspecified sensorineural hearing loss: Secondary | ICD-10-CM | POA: Diagnosis not present

## 2021-10-22 ENCOUNTER — Ambulatory Visit (INDEPENDENT_AMBULATORY_CARE_PROVIDER_SITE_OTHER): Payer: Medicare Other | Admitting: Endocrinology

## 2021-10-22 ENCOUNTER — Encounter: Payer: Self-pay | Admitting: Endocrinology

## 2021-10-22 ENCOUNTER — Telehealth: Payer: Self-pay

## 2021-10-22 ENCOUNTER — Other Ambulatory Visit: Payer: Self-pay | Admitting: Endocrinology

## 2021-10-22 VITALS — BP 104/40 | HR 76 | Ht 66.0 in | Wt 144.2 lb

## 2021-10-22 DIAGNOSIS — E1165 Type 2 diabetes mellitus with hyperglycemia: Secondary | ICD-10-CM

## 2021-10-22 DIAGNOSIS — Z794 Long term (current) use of insulin: Secondary | ICD-10-CM | POA: Diagnosis not present

## 2021-10-22 DIAGNOSIS — I952 Hypotension due to drugs: Secondary | ICD-10-CM

## 2021-10-22 DIAGNOSIS — N184 Chronic kidney disease, stage 4 (severe): Secondary | ICD-10-CM | POA: Diagnosis not present

## 2021-10-22 LAB — COMPREHENSIVE METABOLIC PANEL
ALT: 12 U/L (ref 0–35)
AST: 23 U/L (ref 0–37)
Albumin: 3.6 g/dL (ref 3.5–5.2)
Alkaline Phosphatase: 35 U/L — ABNORMAL LOW (ref 39–117)
BUN: 38 mg/dL — ABNORMAL HIGH (ref 6–23)
CO2: 21 mEq/L (ref 19–32)
Calcium: 9.6 mg/dL (ref 8.4–10.5)
Chloride: 108 mEq/L (ref 96–112)
Creatinine, Ser: 2.32 mg/dL — ABNORMAL HIGH (ref 0.40–1.20)
GFR: 18.28 mL/min — ABNORMAL LOW (ref >60.00)
Glucose, Bld: 75 mg/dL (ref 70–99)
Potassium: 6.1 mEq/L (ref 3.5–5.1)
Sodium: 135 mEq/L (ref 135–145)
Total Bilirubin: 0.6 mg/dL (ref 0.2–1.2)
Total Protein: 7 g/dL (ref 6.0–8.3)

## 2021-10-22 LAB — CBC WITH DIFFERENTIAL/PLATELET
Basophils Absolute: 0.1 10*3/uL (ref 0.0–0.1)
Basophils Relative: 1.4 % (ref 0.0–3.0)
Eosinophils Absolute: 0.1 10*3/uL (ref 0.0–0.7)
Eosinophils Relative: 2.1 % (ref 0.0–5.0)
HCT: 29.2 % — ABNORMAL LOW (ref 36.0–46.0)
Hemoglobin: 9.7 g/dL — ABNORMAL LOW (ref 12.0–15.0)
Lymphocytes Relative: 24.3 % (ref 12.0–46.0)
Lymphs Abs: 1.6 10*3/uL (ref 0.7–4.0)
MCHC: 33.1 g/dL (ref 30.0–36.0)
MCV: 95 fl (ref 78.0–100.0)
Monocytes Absolute: 0.8 10*3/uL (ref 0.1–1.0)
Monocytes Relative: 11.8 % (ref 3.0–12.0)
Neutro Abs: 4 10*3/uL (ref 1.4–7.7)
Neutrophils Relative %: 60.4 % (ref 43.0–77.0)
Platelets: 239 10*3/uL (ref 150.0–400.0)
RBC: 3.07 Mil/uL — ABNORMAL LOW (ref 3.87–5.11)
RDW: 15.3 % (ref 11.5–15.5)
WBC: 6.7 10*3/uL (ref 4.0–10.5)

## 2021-10-22 MED ORDER — SODIUM POLYSTYRENE SULFONATE PO POWD: ORAL | 0 refills | Status: DC

## 2021-10-22 NOTE — Telephone Encounter (Signed)
Jocelyn Mendez from labs called with critical. Potassium is 6.1

## 2021-10-22 NOTE — Patient Instructions (Signed)
Stop Amlodipine  N insulin 40 units daily  If still low reduce to 35

## 2021-10-22 NOTE — Progress Notes (Signed)
Patient ID: Jocelyn Mendez, female   DOB: [birth year], 88 y.o.   MRN: [id number]           Reason for Appointment: Type II Diabetes follow-up   History of Present Illness   Diagnosis date: 1988  Previous history:  Non-insulin hypoglycemic drugs previously used: Unknown Insulin was started in 2012, she has been on NPH, Lantus, Levemir in the past  A1c range in the last few years is: 5.7-9.2  Recent history:     Non-insulin hypoglycemic drugs: None     Insulin regimen: Humulin NPH 46 units in a.m.    Side effects from medications: None  Current self management, blood sugar patterns and problems identified:  A1c is last 7.2 compared to 7% in April She appears to have much lower blood sugars compared to her last visit when she was getting readings as high as 280 after meals  She was told to start 6 units of Humalog at dinnertime to cover her evening meal and reduce NPH to 40 units However she started having low blood sugars especially in the afternoons about 3 to 4 weeks ago About 3 days ago she stopped taking the Humalog at suppertime However she is now taking 46 units again on the NPH and had a low blood sugar of 46 last night In the last week she has been eating smaller meals because of lack of appetite No recent labs available  Exercise: Unable to do much Diet management: Has variable mealtimes and variable appetite, generally eating at least 2 starches in the evenings at dinnertime, does not like to eat much meat Has 2 meals daily     Glucometer: One Touch.           Blood Glucose readings from note:   PRE-MEAL Fasting Lunch Dinner Overnight Overall  Glucose range: 53-189  41-141 46-167 41-195  Mean/median: 112 131 88  111   POST-MEAL PC Breakfast PC Lunch PC Dinner  Glucose range:     Mean/median:   109   Prior  PRE-MEAL Fasting Lunch Dinner Bedtime Overall  Glucose range: 46-80   212-247   Mean/median:        POST-MEAL PC Breakfast PC Lunch PC Dinner   Glucose range:  175-280   Mean/median:       Dietician visit: Most recent: never     Weight control:  Wt Readings from Last 3 Encounters:  10/22/21 144 lb 3.2 oz (65.4 kg)  09/09/21 145 lb 9.6 oz (66 kg)  08/23/21 146 lb (66.2 kg)            Diabetes labs:  Lab Results  Component Value Date   HGBA1C 7.2 (H) 09/04/2021   HGBA1C 7.0 (A) 06/06/2021   HGBA1C 6.5 (A) 03/28/2021   Lab Results  Component Value Date   MICROALBUR 16.3 (H) 06/25/2021   LDLCALC 85 03/19/2011   CREATININE 2.04 (H) 08/23/2021    Lab Results  Component Value Date   FRUCTOSAMINE 318 (H) 06/25/2021   FRUCTOSAMINE 350 (H) 01/22/2021   FRUCTOSAMINE 312 (H) 06/29/2020     Allergies as of 10/22/2021       Reactions   Codeine Other (See Comments)   REACTION: change in mental status   Diphenhydramine Hcl Itching   Quinine Other (See Comments)   REACTION: abnormal sensations in face   Sulfonamide Derivatives Rash        Medication List        Accurate as of October 22, 2021  1:29  PM. If you have any questions, ask your nurse or doctor.          Accu-Chek Guide Me w/Device Kit 1 each by Does not apply route 4 (four) times daily. E11.9   acetaminophen 500 MG tablet Commonly known as: TYLENOL Take 500 mg by mouth every 6 (six) hours as needed.   Adjustable Lancing Device Misc Apply 1 each topically daily.   albuterol 108 (90 Base) MCG/ACT inhaler Commonly known as: VENTOLIN HFA Inhale 2 puffs into the lungs every 6 (six) hours as needed for wheezing or shortness of breath.   amLODipine 5 MG tablet Commonly known as: NORVASC TAKE 1 TABLET BY MOUTH EVERY DAY   ascorbic acid 500 MG tablet Commonly known as: VITAMIN C Take 1 tablet (500 mg total) by mouth daily.   aspirin 81 MG tablet Take 81 mg by mouth daily.   busPIRone 5 MG tablet Commonly known as: BUSPAR TAKE 1 TABLET BY MOUTH TWICE DAILY   cyanocobalamin 1000 MCG/ML injection Commonly known as: VITAMIN  B12 INJECT ONE ML INTRAMUSCULARLY EVERY MONTH   fenofibrate 160 MG tablet TAKE 1 TABLET BY MOUTH DAILY   ferrous sulfate 325 (65 FE) MG EC tablet Take 1 tablet (325 mg total) by mouth daily with breakfast.   gabapentin 100 MG capsule Commonly known as: NEURONTIN TAKE ONE CAPSULE BY MOUTH THREE TIMES DAILY   HumuLIN N KwikPen 100 UNIT/ML Kiwkpen Generic drug: Insulin NPH (Human) ([place]) INJECT 54 UNITS INTO THE SKIN EVERY MORNING   insulin lispro 100 UNIT/ML KwikPen Commonly known as: HumaLOG KwikPen 6 units before dinner, max dose 10 units   iron polysaccharides 150 MG capsule Commonly known as: Nu-Iron Take 1 capsule (150 mg total) by mouth daily.   latanoprost 0.005 % ophthalmic solution Commonly known as: XALATAN Place 1 drop into both eyes at bedtime.   levothyroxine 137 MCG tablet Commonly known as: SYNTHROID Take 1 tablet (137 mcg total) by mouth daily before breakfast.   lidocaine 5 % Commonly known as: LIDODERM APPLY ONE PATCH TO SKIN EVERY DAY. REMOVE AND DISCARD PATCH WITHIN 12 HOURS OR AS DIRECTED BY MD - What changed: See the new instructions.   loperamide 2 MG tablet Commonly known as: IMODIUM A-D Take 2 mg by mouth 4 (four) times daily as needed for diarrhea or loose stools.   NEEDLE (DISP) 25 G 25G X 5/8" Misc Commonly known as: BD Disp Needles Use as directed to administer  the b12 injection monthly   OneTouch Verio test strip Generic drug: glucose blood 1 each by Other route 2 (two) times daily. And lancets 2/day   rosuvastatin 20 MG tablet Commonly known as: CRESTOR Take 1 tablet (20 mg total) by mouth daily.   saccharomyces boulardii 250 MG capsule Commonly known as: FLORASTOR Take 1 capsule (250 mg total) by mouth 2 (two) times daily.   Vitamin D3 50 MCG (2000 UT) Tabs Take 2,000 Units by mouth 2 (two) times daily.   zinc sulfate 220 (50 Zn) MG capsule Take 1 capsule (220 mg total) by mouth daily.        Allergies:  Allergies   Allergen Reactions   Codeine Other (See Comments)    REACTION: change in mental status   Diphenhydramine Hcl Itching   Quinine Other (See Comments)    REACTION: abnormal sensations in face   Sulfonamide Derivatives Rash    Past Medical History:  Diagnosis Date   Acute bronchitis    Anemia of other chronic disease  Anxiety state, unspecified    Chest pain, unspecified    Diverticulosis of colon (without mention of hemorrhage)    Esophageal reflux    Gallstones    Generalized osteoarthrosis, unspecified site    Headache(784.0)    Lumbago    Neuropathy in diabetes ([place])    bilat LE's   Osteoporosis    Other and unspecified hyperlipidemia    Other B-complex deficiencies    Type II or unspecified type diabetes mellitus without mention of complication, not stated as uncontrolled    Unspecified disorder resulting from impaired renal function    Unspecified essential hypertension    Unspecified hypothyroidism     Past Surgical History:  Procedure Laterality Date   APPENDECTOMY     THYROIDECTOMY      Family History  Problem Relation Age of Onset   Diabetes Mother    Cancer Father        unsure ? stomach   Heart attack Brother        x 3   Cancer Sister        unsure type   Lung cancer Brother        smoker   Heart attack Sister     Social History:  reports that she quit smoking about 48 years ago. Her smoking use included cigarettes. She has a 3.00 pack-year smoking history. She quit smokeless tobacco use about 11 years ago.  Her smokeless tobacco use included snuff. She reports that she does not drink alcohol and does not use drugs.  Review of Systems:  Last diabetic eye exam 8/22, followed for glaucoma  Last foot exam date: 5/23 Followed by podiatrist  Hypothyroidism managed by her PCP   Lab Results  Component Value Date   TSH 3.59 06/25/2021   TSH 2.23 02/22/2020   TSH 15.72 (H) 08/01/2019   FREET4 1.04 02/22/2020   FREET4 0.93 08/01/2019   FREET4  0.77 03/03/2019    Hypertension:   She is on amlodipine No complaints of lightheadedness but blood pressure is relatively low  BP Readings from Last 3 Encounters:  10/22/21 (!) 104/40  09/09/21 (!) 140/52  08/23/21 122/84    Lipid management: Treated by her PCP with Crestor 20 mg daily    Lab Results  Component Value Date   CHOL 125 06/25/2021   CHOL 134 03/03/2019   CHOL 127 03/05/2017   Lab Results  Component Value Date   HDL 26.30 (L) 06/25/2021   HDL 30.40 (L) 03/03/2019   HDL 31.30 (L) 03/05/2017   Lab Results  Component Value Date   LDLCALC 85 03/19/2011   LDLCALC 73 09/23/2006   Lab Results  Component Value Date   TRIG 283.0 (H) 06/25/2021   TRIG 233.0 (H) 03/03/2019   TRIG 219.0 (H) 03/05/2017   Lab Results  Component Value Date   CHOLHDL 5 06/25/2021   CHOLHDL 4 03/03/2019   CHOLHDL 4 03/05/2017   Lab Results  Component Value Date   LDLDIRECT 65.0 06/25/2021   LDLDIRECT 72.0 03/03/2019   LDLDIRECT 68.0 03/05/2017     Examination:   BP (!) 104/40   Pulse 76   Ht '5\' 6"'  (1.676 m)   Wt 144 lb 3.2 oz (65.4 kg)   SpO2 95%   BMI 23.27 kg/m   Body mass index is 23.27 kg/m.    ASSESSMENT/ PLAN:    Diabetes type 2:   Current regimen: NPH insulin once a day in the morning  See history of present illness for  detailed discussion of current diabetes management, blood sugar patterns and problems identified  A1c is 7.2 last  Compared to her last visit her blood sugars are much lower including tendency to hypoglycemia with blood sugars as low as 41 Although this may be partly related to decreased appetite in the last week not clear why she is lower insulin sensitive Does not appear to have high postprandial readings recently also Checking blood sugars multiple times a day and this was reviewed by her meter download  In the last few days she has taken 46 units of NPH again and this is likely causing tendency to low sugars which were more often in  the afternoon Explained to her that taking Humalog at dinnertime was not causing low sugars except once   Recommendations:  She will get back on only NPH regimen for now for simplicity She will cut down the dose to 40 units Discussed that if her blood sugars are still getting below 100 will reduce it to 35 Prescription for CGM will be sent through the [facility] website for [place] 3 sensor and this will be started by nurse educator Discussed benefits of using CGM especially alerting her to any low blood sugars She will discuss her abdominal pain and decreased appetite with PCP, CBC was drawn  CKD: Needs follow-up labs Will defer management to PCP but recommended adding Farxiga unless GFR is below 25  Low blood pressure: She will hold amlodipine till seen by PCP  Patient Instructions  Stop Amlodipine  N insulin 40 units daily  If still low reduce to 35   Braylon Grenda 10/22/2021, 1:29 PM

## 2021-10-23 ENCOUNTER — Inpatient Hospital Stay (HOSPITAL_COMMUNITY)
Admission: EM | Admit: 2021-10-23 | Discharge: 2021-10-27 | DRG: 871 | Disposition: A | Discharge status: Home / Self Care | Payer: Medicare Other | Attending: Family Medicine | Admitting: Family Medicine

## 2021-10-23 ENCOUNTER — Emergency Department (HOSPITAL_COMMUNITY): Payer: Medicare Other

## 2021-10-23 ENCOUNTER — Other Ambulatory Visit: Payer: Self-pay

## 2021-10-23 ENCOUNTER — Encounter (HOSPITAL_COMMUNITY): Payer: Self-pay | Admitting: Emergency Medicine

## 2021-10-23 DIAGNOSIS — A4189 Other specified sepsis: Principal | ICD-10-CM | POA: Diagnosis present

## 2021-10-23 DIAGNOSIS — E1122 Type 2 diabetes mellitus with diabetic chronic kidney disease: Secondary | ICD-10-CM | POA: Diagnosis present

## 2021-10-23 DIAGNOSIS — Z885 Allergy status to narcotic agent status: Secondary | ICD-10-CM | POA: Diagnosis not present

## 2021-10-23 DIAGNOSIS — Z794 Long term (current) use of insulin: Secondary | ICD-10-CM

## 2021-10-23 DIAGNOSIS — E538 Deficiency of other specified B group vitamins: Secondary | ICD-10-CM | POA: Diagnosis present

## 2021-10-23 DIAGNOSIS — Z87891 Personal history of nicotine dependence: Secondary | ICD-10-CM

## 2021-10-23 DIAGNOSIS — I10 Essential (primary) hypertension: Secondary | ICD-10-CM | POA: Diagnosis not present

## 2021-10-23 DIAGNOSIS — R6520 Severe sepsis without septic shock: Secondary | ICD-10-CM

## 2021-10-23 DIAGNOSIS — F32A Depression, unspecified: Secondary | ICD-10-CM | POA: Diagnosis not present

## 2021-10-23 DIAGNOSIS — D631 Anemia in chronic kidney disease: Secondary | ICD-10-CM | POA: Diagnosis not present

## 2021-10-23 DIAGNOSIS — D509 Iron deficiency anemia, unspecified: Secondary | ICD-10-CM | POA: Diagnosis present

## 2021-10-23 DIAGNOSIS — E611 Iron deficiency: Secondary | ICD-10-CM | POA: Diagnosis not present

## 2021-10-23 DIAGNOSIS — E1140 Type 2 diabetes mellitus with diabetic neuropathy, unspecified: Secondary | ICD-10-CM | POA: Diagnosis not present

## 2021-10-23 DIAGNOSIS — N1832 Chronic kidney disease, stage 3b: Secondary | ICD-10-CM

## 2021-10-23 DIAGNOSIS — E785 Hyperlipidemia, unspecified: Secondary | ICD-10-CM | POA: Diagnosis present

## 2021-10-23 DIAGNOSIS — J45909 Unspecified asthma, uncomplicated: Secondary | ICD-10-CM | POA: Diagnosis present

## 2021-10-23 DIAGNOSIS — N200 Calculus of kidney: Secondary | ICD-10-CM | POA: Diagnosis not present

## 2021-10-23 DIAGNOSIS — M159 Polyosteoarthritis, unspecified: Secondary | ICD-10-CM | POA: Diagnosis present

## 2021-10-23 DIAGNOSIS — G928 Other toxic encephalopathy: Secondary | ICD-10-CM

## 2021-10-23 DIAGNOSIS — E875 Hyperkalemia: Secondary | ICD-10-CM | POA: Diagnosis not present

## 2021-10-23 DIAGNOSIS — R109 Unspecified abdominal pain: Secondary | ICD-10-CM | POA: Diagnosis not present

## 2021-10-23 DIAGNOSIS — N179 Acute kidney failure, unspecified: Secondary | ICD-10-CM | POA: Diagnosis present

## 2021-10-23 DIAGNOSIS — I129 Hypertensive chronic kidney disease with stage 1 through stage 4 chronic kidney disease, or unspecified chronic kidney disease: Secondary | ICD-10-CM | POA: Diagnosis present

## 2021-10-23 DIAGNOSIS — Z833 Family history of diabetes mellitus: Secondary | ICD-10-CM

## 2021-10-23 DIAGNOSIS — Z7982 Long term (current) use of aspirin: Secondary | ICD-10-CM

## 2021-10-23 DIAGNOSIS — D638 Anemia in other chronic diseases classified elsewhere: Secondary | ICD-10-CM | POA: Diagnosis present

## 2021-10-23 DIAGNOSIS — R6889 Other general symptoms and signs: Secondary | ICD-10-CM | POA: Diagnosis not present

## 2021-10-23 DIAGNOSIS — R4182 Altered mental status, unspecified: Secondary | ICD-10-CM | POA: Diagnosis present

## 2021-10-23 DIAGNOSIS — K219 Gastro-esophageal reflux disease without esophagitis: Secondary | ICD-10-CM | POA: Diagnosis not present

## 2021-10-23 DIAGNOSIS — M810 Age-related osteoporosis without current pathological fracture: Secondary | ICD-10-CM | POA: Diagnosis present

## 2021-10-23 DIAGNOSIS — K8020 Calculus of gallbladder without cholecystitis without obstruction: Secondary | ICD-10-CM | POA: Diagnosis not present

## 2021-10-23 DIAGNOSIS — R531 Weakness: Secondary | ICD-10-CM | POA: Diagnosis not present

## 2021-10-23 DIAGNOSIS — E039 Hypothyroidism, unspecified: Secondary | ICD-10-CM | POA: Diagnosis present

## 2021-10-23 DIAGNOSIS — F411 Generalized anxiety disorder: Secondary | ICD-10-CM | POA: Diagnosis present

## 2021-10-23 DIAGNOSIS — R509 Fever, unspecified: Secondary | ICD-10-CM | POA: Diagnosis not present

## 2021-10-23 DIAGNOSIS — A419 Sepsis, unspecified organism: Secondary | ICD-10-CM | POA: Diagnosis not present

## 2021-10-23 DIAGNOSIS — Z882 Allergy status to sulfonamides status: Secondary | ICD-10-CM

## 2021-10-23 DIAGNOSIS — N3000 Acute cystitis without hematuria: Secondary | ICD-10-CM | POA: Diagnosis not present

## 2021-10-23 DIAGNOSIS — Z888 Allergy status to other drugs, medicaments and biological substances status: Secondary | ICD-10-CM

## 2021-10-23 DIAGNOSIS — E119 Type 2 diabetes mellitus without complications: Secondary | ICD-10-CM

## 2021-10-23 DIAGNOSIS — Z20822 Contact with and (suspected) exposure to covid-19: Secondary | ICD-10-CM | POA: Diagnosis not present

## 2021-10-23 DIAGNOSIS — Z8249 Family history of ischemic heart disease and other diseases of the circulatory system: Secondary | ICD-10-CM

## 2021-10-23 DIAGNOSIS — N184 Chronic kidney disease, stage 4 (severe): Secondary | ICD-10-CM | POA: Diagnosis present

## 2021-10-23 DIAGNOSIS — E876 Hypokalemia: Secondary | ICD-10-CM | POA: Diagnosis present

## 2021-10-23 DIAGNOSIS — R410 Disorientation, unspecified: Secondary | ICD-10-CM | POA: Diagnosis not present

## 2021-10-23 DIAGNOSIS — N390 Urinary tract infection, site not specified: Secondary | ICD-10-CM | POA: Diagnosis present

## 2021-10-23 DIAGNOSIS — Z743 Need for continuous supervision: Secondary | ICD-10-CM | POA: Diagnosis not present

## 2021-10-23 DIAGNOSIS — R0689 Other abnormalities of breathing: Secondary | ICD-10-CM | POA: Diagnosis not present

## 2021-10-23 DIAGNOSIS — Z79890 Hormone replacement therapy (postmenopausal): Secondary | ICD-10-CM

## 2021-10-23 DIAGNOSIS — Z79899 Other long term (current) drug therapy: Secondary | ICD-10-CM

## 2021-10-23 LAB — BASIC METABOLIC PANEL
Anion gap: 10 (ref 5–15)
BUN: 36 mg/dL — ABNORMAL HIGH (ref 8–23)
CO2: 13 mmol/L — ABNORMAL LOW (ref 22–32)
Calcium: 9.7 mg/dL (ref 8.9–10.3)
Chloride: 111 mmol/L (ref 98–111)
Creatinine, Ser: 2.3 mg/dL — ABNORMAL HIGH (ref 0.44–1.00)
GFR, Estimated: 20 mL/min — ABNORMAL LOW (ref >60)
Glucose, Bld: 157 mg/dL — ABNORMAL HIGH (ref 70–99)
Potassium: 6.5 mmol/L (ref 3.5–5.1)
Sodium: 134 mmol/L — ABNORMAL LOW (ref 135–145)

## 2021-10-23 LAB — CBC WITH DIFFERENTIAL/PLATELET
Abs Immature Granulocytes: 0.05 10*3/uL (ref 0.00–0.07)
Basophils Absolute: 0.1 10*3/uL (ref 0.0–0.1)
Basophils Relative: 1 %
Eosinophils Absolute: 0 10*3/uL (ref 0.0–0.5)
Eosinophils Relative: 0 %
HCT: 32.7 % — ABNORMAL LOW (ref 36.0–46.0)
Hemoglobin: 10.2 g/dL — ABNORMAL LOW (ref 12.0–15.0)
Immature Granulocytes: 1 %
Lymphocytes Relative: 14 %
Lymphs Abs: 1.4 10*3/uL (ref 0.7–4.0)
MCH: 31.3 pg (ref 26.0–34.0)
MCHC: 31.2 g/dL (ref 30.0–36.0)
MCV: 100.3 fL — ABNORMAL HIGH (ref 80.0–100.0)
Monocytes Absolute: 1.2 10*3/uL — ABNORMAL HIGH (ref 0.1–1.0)
Monocytes Relative: 12 %
Neutro Abs: 7.2 10*3/uL (ref 1.7–7.7)
Neutrophils Relative %: 72 %
Platelets: 244 10*3/uL (ref 150–400)
RBC: 3.26 MIL/uL — ABNORMAL LOW (ref 3.87–5.11)
RDW: 14.5 % (ref 11.5–15.5)
WBC: 10.1 10*3/uL (ref 4.0–10.5)
nRBC: 0 % (ref 0.0–0.2)

## 2021-10-23 LAB — URINALYSIS, ROUTINE W REFLEX MICROSCOPIC
Bilirubin Urine: NEGATIVE
Glucose, UA: NEGATIVE mg/dL
Ketones, ur: 5 mg/dL — AB
Nitrite: NEGATIVE
Protein, ur: 30 mg/dL — AB
Specific Gravity, Urine: 1.011 (ref 1.005–1.030)
WBC, UA: >50 WBC/hpf — ABNORMAL HIGH (ref 0–5)
pH: 6 (ref 5.0–8.0)

## 2021-10-23 LAB — POTASSIUM: Potassium: 5.9 mmol/L — ABNORMAL HIGH (ref 3.5–5.1)

## 2021-10-23 LAB — PROTIME-INR
INR: 1.3 — ABNORMAL HIGH (ref 0.8–1.2)
Prothrombin Time: 16 seconds — ABNORMAL HIGH (ref 11.4–15.2)

## 2021-10-23 LAB — COMPREHENSIVE METABOLIC PANEL
ALT: 15 U/L (ref 0–44)
AST: 23 U/L (ref 15–41)
Albumin: 3.7 g/dL (ref 3.5–5.0)
Alkaline Phosphatase: 36 U/L — ABNORMAL LOW (ref 38–126)
Anion gap: 8 (ref 5–15)
BUN: 36 mg/dL — ABNORMAL HIGH (ref 8–23)
CO2: 16 mmol/L — ABNORMAL LOW (ref 22–32)
Calcium: 9.5 mg/dL (ref 8.9–10.3)
Chloride: 110 mmol/L (ref 98–111)
Creatinine, Ser: 2.28 mg/dL — ABNORMAL HIGH (ref 0.44–1.00)
GFR, Estimated: 20 mL/min — ABNORMAL LOW (ref >60)
Glucose, Bld: 150 mg/dL — ABNORMAL HIGH (ref 70–99)
Potassium: 6.2 mmol/L — ABNORMAL HIGH (ref 3.5–5.1)
Sodium: 134 mmol/L — ABNORMAL LOW (ref 135–145)
Total Bilirubin: 1.4 mg/dL — ABNORMAL HIGH (ref 0.3–1.2)
Total Protein: 7.6 g/dL (ref 6.5–8.1)

## 2021-10-23 LAB — AMMONIA: Ammonia: 17 umol/L (ref 9–35)

## 2021-10-23 LAB — GLUCOSE, CAPILLARY
Glucose-Capillary: 152 mg/dL — ABNORMAL HIGH (ref 70–99)
Glucose-Capillary: 174 mg/dL — ABNORMAL HIGH (ref 70–99)

## 2021-10-23 LAB — MRSA NEXT GEN BY PCR, NASAL: MRSA by PCR Next Gen: NOT DETECTED

## 2021-10-23 LAB — MAGNESIUM
Magnesium: 1.9 mg/dL (ref 1.7–2.4)
Magnesium: 2 mg/dL (ref 1.7–2.4)

## 2021-10-23 LAB — PHOSPHORUS: Phosphorus: 3.7 mg/dL (ref 2.5–4.6)

## 2021-10-23 LAB — SARS CORONAVIRUS 2 BY RT PCR: SARS Coronavirus 2 by RT PCR: NEGATIVE

## 2021-10-23 LAB — APTT: aPTT: 27 seconds (ref 24–36)

## 2021-10-23 LAB — LACTIC ACID, PLASMA
Lactic Acid, Venous: 1.1 mmol/L (ref 0.5–1.9)
Lactic Acid, Venous: 0.9 mmol/L (ref 0.5–1.9)

## 2021-10-23 MED ORDER — LACTATED RINGERS IV BOLUS (SEPSIS): 1000.0000 mL | Freq: Once | INTRAVENOUS | Status: DC

## 2021-10-23 MED ORDER — GABAPENTIN 100 MG PO CAPS
100.0000 mg | ORAL_CAPSULE | Freq: Three times a day (TID) | ORAL | Status: DC
  Administered 2021-10-24 – 2021-10-26 (×9): 100 mg via ORAL
  Filled 2021-10-23 (×9): qty 1

## 2021-10-23 MED ORDER — ONDANSETRON HCL 4 MG PO TABS: 4.0000 mg | ORAL_TABLET | Freq: Four times a day (QID) | ORAL | Status: DC | PRN

## 2021-10-23 MED ORDER — BISACODYL 5 MG PO TBEC: 5.0000 mg | DELAYED_RELEASE_TABLET | Freq: Every day | ORAL | Status: DC | PRN

## 2021-10-23 MED ORDER — CALCIUM GLUCONATE 10 % IV SOLN: 1.0000 g | Freq: Once | INTRAVENOUS | Status: DC

## 2021-10-23 MED ORDER — SODIUM CHLORIDE 0.9% FLUSH: 3.0000 mL | INTRAVENOUS | Status: DC | PRN

## 2021-10-23 MED ORDER — HYDRALAZINE HCL 20 MG/ML IJ SOLN: 10.0000 mg | INTRAMUSCULAR | Status: DC | PRN

## 2021-10-23 MED ORDER — LATANOPROST 0.005 % OP SOLN
1.0000 [drp] | Freq: Every day | OPHTHALMIC | Status: DC
  Administered 2021-10-24 – 2021-10-26 (×4): 1 [drp] via OPHTHALMIC
  Filled 2021-10-23 (×2): qty 2.5

## 2021-10-23 MED ORDER — LEVOTHYROXINE SODIUM 137 MCG PO TABS
137.0000 ug | ORAL_TABLET | Freq: Every day | ORAL | Status: DC
  Administered 2021-10-24 – 2021-10-27 (×4): 137 ug via ORAL
  Filled 2021-10-23 (×4): qty 1

## 2021-10-23 MED ORDER — HYDROMORPHONE HCL 1 MG/ML IJ SOLN
0.5000 mg | INTRAMUSCULAR | Status: DC | PRN
  Administered 2021-10-23: 1 mg via INTRAVENOUS
  Filled 2021-10-23: qty 1

## 2021-10-23 MED ORDER — TRAZODONE HCL 50 MG PO TABS: 25.0000 mg | ORAL_TABLET | Freq: Every evening | ORAL | Status: DC | PRN

## 2021-10-23 MED ORDER — SODIUM CHLORIDE 0.9 % IV SOLN: INTRAVENOUS | Status: AC

## 2021-10-23 MED ORDER — POLYSACCHARIDE IRON COMPLEX 150 MG PO CAPS
150.0000 mg | ORAL_CAPSULE | Freq: Every day | ORAL | Status: DC
  Administered 2021-10-23 – 2021-10-27 (×5): 150 mg via ORAL
  Filled 2021-10-23 (×5): qty 1

## 2021-10-23 MED ORDER — ACETAMINOPHEN 325 MG PO TABS
650.0000 mg | ORAL_TABLET | Freq: Four times a day (QID) | ORAL | Status: DC | PRN
  Administered 2021-10-26: 650 mg via ORAL
  Filled 2021-10-23: qty 2

## 2021-10-23 MED ORDER — LEVALBUTEROL HCL 0.63 MG/3ML IN NEBU: 0.6300 mg | INHALATION_SOLUTION | Freq: Four times a day (QID) | RESPIRATORY_TRACT | Status: DC | PRN

## 2021-10-23 MED ORDER — CHLORHEXIDINE GLUCONATE CLOTH 2 % EX PADS
6.0000 | MEDICATED_PAD | Freq: Every day | CUTANEOUS | Status: DC
  Administered 2021-10-23 – 2021-10-27 (×5): 6 via TOPICAL

## 2021-10-23 MED ORDER — SODIUM CHLORIDE 0.9% FLUSH
3.0000 mL | Freq: Two times a day (BID) | INTRAVENOUS | Status: DC
  Administered 2021-10-23 – 2021-10-27 (×9): 3 mL via INTRAVENOUS

## 2021-10-23 MED ORDER — FLEET ENEMA 7-19 GM/118ML RE ENEM: 1.0000 | ENEMA | Freq: Once | RECTAL | Status: DC | PRN

## 2021-10-23 MED ORDER — RISAQUAD PO CAPS
1.0000 | ORAL_CAPSULE | Freq: Three times a day (TID) | ORAL | Status: DC
  Administered 2021-10-23 – 2021-10-27 (×12): 1 via ORAL
  Filled 2021-10-23 (×11): qty 1

## 2021-10-23 MED ORDER — SODIUM POLYSTYRENE SULFONATE 15 GM/60ML PO SUSP: 15.0000 g | Freq: Once | ORAL | Status: DC

## 2021-10-23 MED ORDER — IPRATROPIUM BROMIDE 0.02 % IN SOLN: 0.5000 mg | Freq: Four times a day (QID) | RESPIRATORY_TRACT | Status: DC | PRN

## 2021-10-23 MED ORDER — SENNOSIDES-DOCUSATE SODIUM 8.6-50 MG PO TABS: 1.0000 | ORAL_TABLET | Freq: Every evening | ORAL | Status: DC | PRN

## 2021-10-23 MED ORDER — ONDANSETRON HCL 4 MG/2ML IJ SOLN: 4.0000 mg | Freq: Four times a day (QID) | INTRAMUSCULAR | Status: DC | PRN

## 2021-10-23 MED ORDER — SODIUM CHLORIDE 0.9 % IV BOLUS
1000.0000 mL | Freq: Once | INTRAVENOUS | Status: AC
  Administered 2021-10-23: 1000 mL via INTRAVENOUS

## 2021-10-23 MED ORDER — SODIUM CHLORIDE 0.9 % IV SOLN: 250.0000 mL | INTRAVENOUS | Status: DC | PRN

## 2021-10-23 MED ORDER — ACETAMINOPHEN 500 MG PO TABS
1000.0000 mg | ORAL_TABLET | Freq: Once | ORAL | Status: AC
  Administered 2021-10-23: 1000 mg via ORAL
  Filled 2021-10-23: qty 2

## 2021-10-23 MED ORDER — SODIUM POLYSTYRENE SULFONATE 15 GM/60ML PO SUSP
30.0000 g | Freq: Once | ORAL | Status: AC
  Administered 2021-10-23: 30 g via ORAL
  Filled 2021-10-23: qty 120

## 2021-10-23 MED ORDER — INSULIN ASPART 100 UNIT/ML IJ SOLN
0.0000 [IU] | Freq: Three times a day (TID) | INTRAMUSCULAR | Status: DC
  Administered 2021-10-23 – 2021-10-24 (×2): 2 [IU] via SUBCUTANEOUS

## 2021-10-23 MED ORDER — CALCIUM GLUCONATE-NACL 1-0.675 GM/50ML-% IV SOLN
1.0000 g | Freq: Once | INTRAVENOUS | Status: AC
  Administered 2021-10-23: 1000 mg via INTRAVENOUS
  Filled 2021-10-23: qty 50

## 2021-10-23 MED ORDER — ACETAMINOPHEN 650 MG RE SUPP: 650.0000 mg | Freq: Four times a day (QID) | RECTAL | Status: DC | PRN

## 2021-10-23 MED ORDER — SODIUM CHLORIDE 0.9 % IV SOLN
2.0000 g | INTRAVENOUS | Status: DC
  Administered 2021-10-23 – 2021-10-25 (×3): 2 g via INTRAVENOUS
  Filled 2021-10-23 (×4): qty 12.5

## 2021-10-23 MED ORDER — SODIUM CHLORIDE 0.9 % IV SOLN
1.0000 g | Freq: Once | INTRAVENOUS | Status: AC
  Administered 2021-10-23: 1 g via INTRAVENOUS
  Filled 2021-10-23: qty 10

## 2021-10-23 MED ORDER — HEPARIN SODIUM (PORCINE) 5000 UNIT/ML IJ SOLN
5000.0000 [IU] | Freq: Three times a day (TID) | INTRAMUSCULAR | Status: DC
  Administered 2021-10-23 – 2021-10-27 (×12): 5000 [IU] via SUBCUTANEOUS
  Filled 2021-10-23 (×12): qty 1

## 2021-10-23 MED ORDER — ASPIRIN 81 MG PO TBEC
81.0000 mg | DELAYED_RELEASE_TABLET | Freq: Every day | ORAL | Status: DC
  Administered 2021-10-23 – 2021-10-27 (×5): 81 mg via ORAL
  Filled 2021-10-23 (×5): qty 1

## 2021-10-23 MED ORDER — SODIUM POLYSTYRENE SULFONATE PO POWD: 30.0000 g | Freq: Every day | ORAL | Status: DC

## 2021-10-23 MED ORDER — SODIUM CHLORIDE 0.9 % IV SOLN: 2.0000 g | Freq: Once | INTRAVENOUS | Status: DC

## 2021-10-23 MED ORDER — CALCIUM GLUCONATE 10 % IV SOLN
1.0000 g | Freq: Once | INTRAVENOUS | Status: AC
  Administered 2021-10-23: 1 g via INTRAVENOUS
  Filled 2021-10-23: qty 10

## 2021-10-23 MED ORDER — ORAL CARE MOUTH RINSE: 15.0000 mL | OROMUCOSAL | Status: DC | PRN

## 2021-10-23 MED ORDER — OXYCODONE HCL 5 MG PO TABS: 5.0000 mg | ORAL_TABLET | ORAL | Status: DC | PRN

## 2021-10-23 MED ORDER — SODIUM CHLORIDE 0.9% FLUSH: 3.0000 mL | Freq: Two times a day (BID) | INTRAVENOUS | Status: DC

## 2021-10-23 MED ORDER — FUROSEMIDE 10 MG/ML IJ SOLN
20.0000 mg | Freq: Once | INTRAMUSCULAR | Status: AC
  Administered 2021-10-23: 20 mg via INTRAVENOUS
  Filled 2021-10-23: qty 2

## 2021-10-23 NOTE — Assessment & Plan Note (Addendum)
-   Resolved - Due to sepsis UTI-continue with the as needed Tylenol

## 2021-10-23 NOTE — Assessment & Plan Note (Addendum)
-   Continue p.o. Synthroid once tolerating p.o. -3.59 months ago

## 2021-10-23 NOTE — Assessment & Plan Note (Addendum)
-  Resolved likely due to sepsis  - treating underlying causes of sepsis, UTI -Avoiding hallucinogenic's, and narcotics

## 2021-10-23 NOTE — Assessment & Plan Note (Addendum)
-   Baseline creatinine 1.6-1.8  -Baseline BUN 26- 34, 36, 33 -GFR baseline around 30 --- 18.28  -Acute on CKD stage 4- Creatinine 2.32 >> 2.28 >> 1.98, 1.89

## 2021-10-23 NOTE — Assessment & Plan Note (Addendum)
Today: BP 96/76, pulse 91, temp. 98.8 F (37.1 C), RR 18,  weight 62.7 kg, SpO2 100 %.   -On admission: met sepsis criteria: Tmax 102.1, RR 24, PR 102, BP 104/40, was satting 86% on arrival, improved to 97% With endorgan compromise a on CKD, creatinine 2.32, potassium 6.2   -Likely likely UTI, urosepsis -previous culture consistent with Klebsiella.  Sensitive to broad-spectrum antibiotic exception of ampicillin -Received a dose of Rocephin, will be switched to cefepime -Continue IV antibiotic of cefepime -Urine culture>> -Blood cultures>>  -Sepsis protocol initiated: IV fluid switched from LR to NS due to hyperkalemia -Patient will be transferred out of ICU today

## 2021-10-23 NOTE — ED Triage Notes (Signed)
Per EMS patient has been having dysuria with urinary incontinence and has a foul smelling odor coming from patient. CBG 128. Patient alert & oriented X3.

## 2021-10-23 NOTE — Hospital Course (Signed)
Jocelyn Mendez is a 88 year old female presenting from home with altered mental status.  Medical history of DM 2 (last A1c 6.2 in July 23), HTN, HLD, CKD stage 4, GERD, hypothyroidism, depression, iron deficiency anemia, and asthma.   Patient is accompanied by 2 sons who reported patient "got sick " very quickly overnight, developed fever, more confused now.  It looks like she was going to pass out.  She saw her endocrinologist yesterday who started her on potassium low-dose and advised to go to PCP.  Patient had some nausea and abdominal pain but no vomiting denied have diarrhea constipation  she has been complaining of dysuria and urinary incontinency..  Her urine has been smelling foul odor.     ED course: Blood pressure (!) 123/59, pulse 93, temperature 99.5 F (37.5 C), temperature source Oral, resp. rate 19, height '5\' 6"'  (1.676 m), weight 65.3 kg, SpO2 98 %.     Latest Ref Rng & Units 10/23/2021   12:50 PM 10/22/2021    1:32 PM 09/04/2021    2:25 PM  CMP  Glucose 70 - 99 mg/dL 150  75  105   BUN 8 - 23 mg/dL 36  38    Creatinine 0.44 - 1.00 mg/dL 2.28  2.32    Sodium 135 - 145 mmol/L 134  135    Potassium 3.5 - 5.1 mmol/L 6.2  6.1 No hemolysis seen    Chloride 98 - 111 mmol/L 110  108    CO2 22 - 32 mmol/L 16  21    Calcium 8.9 - 10.3 mg/dL 9.5  9.6    Total Protein 6.5 - 8.1 g/dL 7.6  7.0    Total Bilirubin 0.3 - 1.2 mg/dL 1.4  0.6    Alkaline Phos 38 - 126 U/L 36  35    AST 15 - 41 U/L 23  23    ALT 0 - 44 U/L 15  12      UA positive for large leukocyte esterase, many bacteria, > 50 WBCs  -On arrival: met sepsis criteria: Tmax 102.1, RR 24, PR 102, BP 104/40, was satting 86% on arrival, improved to 97% With endorgan compromise a on CKD, creatinine 2.32, potassium 6.2  Patient was given a liter of IV normal saline, IV Rocephin, calcium gluconate 1 g Blood and urine cultures were obtained  Requested patient to be admitted for urosepsis

## 2021-10-23 NOTE — ED Provider Notes (Signed)
[facility] EMERGENCY DEPARTMENT Provider Note   CSN: [id number] Arrival date & time: 10/23/21  1154     History {Add pertinent medical, surgical, social history, OB history to HPI:1} Chief Complaint  Patient presents with  . Altered Mental Status    Jocelyn Mendez is a 88 y.o. female with GERD, HTN, anemia of chronic disease, hypothyroidism, CKD stage 4, diabetes, asthma presents with AMS.  History obtained from 2 sons at patient's bedside.  They report that she acutely "got sick" last night, was not herself, developed increased confusion/altered mental status,"looked like she was going to pass out."  Patient did see her endocrinologist today who started her on a low-dose potassium supplementation and advised her to go to her PCP for hypokalemia.  Patient's son endorses some nausea and abdominal pain but no vomiting, denies diarrhea constipation.  Reportedly patient has also complained of dysuria and urinary incontinence.  Bedside RN noted foul-smelling urine.  Per EMS CBG 128. Patient alert & oriented X3.  Patient denies any pain anywhere   Altered Mental Status      Home Medications Prior to Admission medications   Medication Sig Start Date End Date Taking? Authorizing Provider  acetaminophen (TYLENOL) 500 MG tablet Take 500 mg by mouth every 6 (six) hours as needed.    [provider]  albuterol (PROVENTIL HFA;VENTOLIN HFA) 108 (90 BASE) MCG/ACT inhaler Inhale 2 puffs into the lungs every 6 (six) hours as needed for wheezing or shortness of breath. 04/06/14   Hoyt Koch, MD  amLODipine (NORVASC) 5 MG tablet TAKE 1 TABLET BY MOUTH EVERY DAY 08/01/21   Hoyt Koch, MD  ascorbic acid (VITAMIN C) 500 MG tablet Take 1 tablet (500 mg total) by mouth daily. 11/23/20   Johnson, Clanford L, MD  aspirin 81 MG tablet Take 81 mg by mouth daily.    [provider]  Blood Glucose Monitoring Suppl (ACCU-CHEK GUIDE ME) w/Device KIT 1 each by Does not apply  route 4 (four) times daily. E11.9 06/15/19   Renato Shin, MD  busPIRone (BUSPAR) 5 MG tablet TAKE 1 TABLET BY MOUTH TWICE DAILY 07/12/21   Hoyt Koch, MD  Cholecalciferol (VITAMIN D3) 2000 UNITS TABS Take 2,000 Units by mouth 2 (two) times daily.     [provider]  cyanocobalamin (,VITAMIN B-12,) 1000 MCG/ML injection INJECT ONE ML INTRAMUSCULARLY EVERY MONTH 02/27/21   Hoyt Koch, MD  fenofibrate 160 MG tablet TAKE 1 TABLET BY MOUTH DAILY 08/27/21   Hoyt Koch, MD  ferrous sulfate 325 (65 FE) MG EC tablet Take 1 tablet (325 mg total) by mouth daily with breakfast. 11/22/20   Wynetta Emery, Clanford L, MD  gabapentin (NEURONTIN) 100 MG capsule TAKE ONE CAPSULE BY MOUTH THREE TIMES DAILY 08/27/21   Hoyt Koch, MD  glucose blood (ONETOUCH VERIO) test strip 1 each by Other route 2 (two) times daily. And lancets 2/day 01/22/21   Renato Shin, MD  HUMULIN N KWIKPEN 100 UNIT/ML KwikPen INJECT 54 UNITS INTO THE SKIN EVERY MORNING 04/29/21   Renato Shin, MD  insulin lispro (HUMALOG KWIKPEN) 100 UNIT/ML KwikPen 6 units before dinner, max dose 10 units Patient not taking: Reported on 10/22/2021 09/09/21   Elayne Snare, MD  iron polysaccharides (NU-IRON) 150 MG capsule Take 1 capsule (150 mg total) by mouth daily. 12/05/20   Biagio Borg, MD  Lancet Devices (ADJUSTABLE LANCING DEVICE) MISC Apply 1 each topically daily. 02/02/20   [provider]  latanoprost (XALATAN) 0.005 %  ophthalmic solution Place 1 drop into both eyes at bedtime. 08/12/16   [provider]  levothyroxine (SYNTHROID) 137 MCG tablet Take 1 tablet (137 mcg total) by mouth daily before breakfast. 07/12/21   Hoyt Koch, MD  lidocaine (LIDODERM) 5 % APPLY ONE PATCH TO SKIN EVERY DAY. REMOVE AND DISCARD PATCH WITHIN 12 HOURS OR AS DIRECTED BY MD - Patient taking differently: Place 1 patch onto the skin daily. 06/17/18   Hoyt Koch, MD  loperamide (IMODIUM A-D) 2 MG  tablet Take 2 mg by mouth 4 (four) times daily as needed for diarrhea or loose stools.    [provider]  NEEDLE, DISP, 25 G (BD DISP NEEDLES) 25G X 5/8" MISC Use as directed to administer  the b12 injection monthly 10/05/14   Hoyt Koch, MD  rosuvastatin (CRESTOR) 20 MG tablet Take 1 tablet (20 mg total) by mouth daily. 09/23/21   Hoyt Koch, MD  saccharomyces boulardii (FLORASTOR) 250 MG capsule Take 1 capsule (250 mg total) by mouth 2 (two) times daily. 08/26/16   Florencia Reasons, MD  sodium polystyrene (KAYEXALATE) powder Take 15 mg dose now and another dose in the morning 10/22/21   Elayne Snare, MD  zinc sulfate 220 (50 Zn) MG capsule Take 1 capsule (220 mg total) by mouth daily. 11/23/20   Johnson, Clanford L, MD      Allergies    Codeine, Diphenhydramine hcl, Quinine, and Sulfonamide derivatives    Review of Systems   Review of Systems Review of systems positive/negative for ***.  A 10 point review of systems was performed and is negative unless otherwise reported in HPI.  Physical Exam Updated Vital Signs BP (!) 150/55 (BP Location: Left Arm)   Pulse (!) 102   Temp (!) 102.1 F (38.9 C) (Rectal)   Resp (!) 22   Ht '5\' 6"'  (1.676 m)   Wt 65.3 kg   SpO2 96%   BMI 23.24 kg/m  Physical Exam General: Normal appearing ***female/female, lying in bed.  HEENT: PERRLA, Sclera anicteric, MMM, trachea midline. Cardiology: RRR, no murmurs/rubs/gallops. BL radial and DP pulses equal bilaterally.  Resp: Normal respiratory rate and effort. CTAB, no wheezes, rhonchi, crackles.  Abd: Soft, non-tender, non-distended. No rebound tenderness or guarding.  GU: Deferred. MSK: No peripheral edema or signs of trauma. Extremities without deformity or TTP. No cyanosis or clubbing. Skin: warm, dry. No rashes or lesions. Back: No CVA tenderness Neuro: A&Ox4, CNs II-XII grossly intact. MAEs. Sensation grossly intact.  Psych: Normal mood and affect.   ED Results / Procedures /  Treatments   Labs (all labs ordered are listed, but only abnormal results are displayed) Labs Reviewed - No data to display  EKG None  Radiology No results found.  Procedures Procedures  {Document cardiac monitor, telemetry assessment procedure when appropriate:1}  Medications Ordered in ED Medications - No data to display  ED Course/ Medical Decision Making/ A&P                          Medical Decision Making Amount and/or Complexity of Data Reviewed Labs: ordered. Decision-making details documented in ED Course. Radiology: ordered. Decision-making details documented in ED Course.  Risk OTC drugs. Prescription drug management. Decision regarding hospitalization.    '@HNNMDM' @ ***  I have personally reviewed and interpreted all labs and imaging.   Clinical Course as of 10/27/21 1203  Wed Oct 23, 2021  1249 Accompanied by her two sons. State  that she "got sick" last night, became pale and with a fever. Looked like she was going to pass out. Saw endocrinologist yesterday who stated that her potassium was low and advised to got o PCP. Called her PCP and told them to come to ED. +nausea and abdominal pain. No vomiting, diarrhea/constipation. Last BM was yesterday afternoon. Confused, would normally be A&Ox4 but now is A&Ox1.  [HN]  1341 Creatinine(!): 2.28 AKI 2.28 up from recent baseline 1.8-2.0 [HN]  1341 Potassium(!): 6.2 Will start hyperkalemia protocol [HN]  1428 Lactic Acid, Venous: 1.1 [HN]  1428 Urinalysis, Routine w reflex microscopic Urine, Clean Catch(!) UTI, will treat with ceftriaxone per prior sensitivities [HN]  1433 CT ABDOMEN PELVIS WO CONTRAST IMPRESSION: 1. There is gas within the lumen of the urinary bladder with air-fluid level. Correlate for any clinical signs or symptoms of cystitis. 2. Wall thickening with intramural fatty deposition is noted involving the ascending colon. No surrounding inflammatory fat stranding or free fluid identified.  Findings may reflect sequelae of chronic inflammation. 3. Bilateral nonobstructing renal calculi. 4. Gallstones. 5. Moderate pericardial fluid along the left heart border is unchanged from previous exam. 6. Aortic Atherosclerosis (ICD10-I70.0).   [HN]  7185 Consulted to hospitalist for admission [HN]  1449 Admitted to hospitalist [HN]    Clinical Course User Index [HN] Audley Hose, MD    {Document critical care time when appropriate:1} {Document review of labs and clinical decision tools ie heart score, Chads2Vasc2 etc:1}  {Document your independent review of radiology images, and any outside records:1} {Document your discussion with family members, caretakers, and with consultants:1} {Document social determinants of health affecting pt's care:1} {Document your decision making why or why not admission, treatments were needed:1} Final Clinical Impression(s) / ED Diagnoses Final diagnoses:  None    Rx / DC Orders ED Discharge Orders     None        This note was created using dictation software, which may contain spelling or grammatical errors.

## 2021-10-23 NOTE — Sepsis Progress Note (Signed)
eLink monitoring code sepsis.  

## 2021-10-23 NOTE — Assessment & Plan Note (Addendum)
--   Carb modified diabetic diet -Resuming home regimen meds

## 2021-10-23 NOTE — Assessment & Plan Note (Addendum)
-   Baseline hemoglobin around 11 -Hemoglobin dropped to 8.4, likely delusional, No signs of bleeding

## 2021-10-23 NOTE — Assessment & Plan Note (Addendum)
-    continuing B12 supplements

## 2021-10-23 NOTE — H&P (Signed)
History and Physical   Patient: Jocelyn Mendez                            PCP: Hoyt Koch, MD                    DOB: Aug 22, 1930            DOA: 10/23/2021 [id number]             DOS: 10/23/2021, 3:35 PM  Hoyt Koch, MD  Patient coming from:   HOME  I have personally reviewed patient's medical records, in electronic medical records, including:  [facility], and care everywhere.    Chief Complaint:   Chief Complaint  Patient presents with   Altered Mental Status    History of present illness:    Jocelyn Mendez is a 88 year old female presenting from home with altered mental status.  Medical history of DM 2 (last A1c 6.2 in July 23), HTN, HLD, CKD stage 4, GERD, hypothyroidism, depression, iron deficiency anemia, and asthma.   Patient is accompanied by 2 sons who reported patient "got sick " very quickly overnight, developed fever, more confused now.  It looks like she was going to pass out.  She saw her endocrinologist yesterday who started her on potassium low-dose and advised to go to PCP.  Patient had some nausea and abdominal pain but no vomiting denied have diarrhea constipation  she has been complaining of dysuria and urinary incontinency..  Her urine has been smelling foul odor.     ED course: Blood pressure (!) 123/59, pulse 93, temperature 99.5 F (37.5 C), temperature source Oral, resp. rate 19, height _0  (1.676 m), weight 65.3 kg, SpO2 98 %.     Latest Ref Rng & Units 10/23/2021   12:50 PM 10/22/2021    1:32 PM 09/04/2021    2:25 PM  CMP  Glucose 70 - 99 mg/dL 150  75  105   BUN 8 - 23 mg/dL 36  38    Creatinine 0.44 - 1.00 mg/dL 2.28  2.32    Sodium 135 - 145 mmol/L 134  135    Potassium 3.5 - 5.1 mmol/L 6.2  6.1 No hemolysis seen    Chloride 98 - 111 mmol/L 110  108    CO2 22 - 32 mmol/L 16  21    Calcium 8.9 - 10.3 mg/dL 9.5  9.6    Total Protein 6.5 - 8.1 g/dL 7.6  7.0    Total Bilirubin 0.3 - 1.2 mg/dL 1.4  0.6     Alkaline Phos 38 - 126 U/L 36  35    AST 15 - 41 U/L 23  23    ALT 0 - 44 U/L 15  12      UA positive for large leukocyte esterase, many bacteria, > 50 WBCs  -On arrival: met sepsis criteria: Tmax 102.1, RR 24, PR 102, BP 104/40, was satting 86% on arrival, improved to 97% With endorgan compromise a on CKD, creatinine 2.32, potassium 6.2  Patient was given a liter of IV normal saline, IV Rocephin, calcium gluconate 1 g Blood and urine cultures were obtained  Requested patient to be admitted for urosepsis    Patient Denies having:  Cough, SOB, Chest Pain, Abd pain, N/V/D, headache, dizziness, lightheadedness,  Dysuria, Joint pain, rash, open wounds    Review of Systems: As per HPI, otherwise 10 point review  of systems were negative.   ----------------------------------------------------------------------------------------------------------------------  Allergies  Allergen Reactions   Codeine Other (See Comments)    REACTION: change in mental status   Diphenhydramine Hcl Itching   Quinine Other (See Comments)    REACTION: abnormal sensations in face   Sulfonamide Derivatives Rash    Home MEDs:  Prior to Admission medications   Medication Sig Start Date End Date Taking? Authorizing Provider  acetaminophen (TYLENOL) 500 MG tablet Take 500 mg by mouth every 6 (six) hours as needed.    [provider]  albuterol (PROVENTIL HFA;VENTOLIN HFA) 108 (90 BASE) MCG/ACT inhaler Inhale 2 puffs into the lungs every 6 (six) hours as needed for wheezing or shortness of breath. 04/06/14   Hoyt Koch, MD  amLODipine (NORVASC) 5 MG tablet TAKE 1 TABLET BY MOUTH EVERY DAY 08/01/21   Hoyt Koch, MD  ascorbic acid (VITAMIN C) 500 MG tablet Take 1 tablet (500 mg total) by mouth daily. 11/23/20   Johnson, Clanford L, MD  aspirin 81 MG tablet Take 81 mg by mouth daily.    [provider]  Blood Glucose Monitoring Suppl (ACCU-CHEK GUIDE ME) w/Device KIT 1 each  by Does not apply route 4 (four) times daily. E11.9 06/15/19   Renato Shin, MD  busPIRone (BUSPAR) 5 MG tablet TAKE 1 TABLET BY MOUTH TWICE DAILY 07/12/21   Hoyt Koch, MD  Cholecalciferol (VITAMIN D3) 2000 UNITS TABS Take 2,000 Units by mouth 2 (two) times daily.     [provider]  cyanocobalamin (,VITAMIN B-12,) 1000 MCG/ML injection INJECT ONE ML INTRAMUSCULARLY EVERY MONTH 02/27/21   Hoyt Koch, MD  fenofibrate 160 MG tablet TAKE 1 TABLET BY MOUTH DAILY 08/27/21   Hoyt Koch, MD  ferrous sulfate 325 (65 FE) MG EC tablet Take 1 tablet (325 mg total) by mouth daily with breakfast. 11/22/20   Wynetta Emery, Clanford L, MD  gabapentin (NEURONTIN) 100 MG capsule TAKE ONE CAPSULE BY MOUTH THREE TIMES DAILY 08/27/21   Hoyt Koch, MD  glucose blood (ONETOUCH VERIO) test strip 1 each by Other route 2 (two) times daily. And lancets 2/day 01/22/21   Renato Shin, MD  HUMULIN N KWIKPEN 100 UNIT/ML KwikPen INJECT 54 UNITS INTO THE SKIN EVERY MORNING 04/29/21   Renato Shin, MD  insulin lispro (HUMALOG KWIKPEN) 100 UNIT/ML KwikPen 6 units before dinner, max dose 10 units Patient not taking: Reported on 10/22/2021 09/09/21   Elayne Snare, MD  iron polysaccharides (NU-IRON) 150 MG capsule Take 1 capsule (150 mg total) by mouth daily. 12/05/20   Biagio Borg, MD  Lancet Devices (ADJUSTABLE LANCING DEVICE) MISC Apply 1 each topically daily. 02/02/20   [provider]  latanoprost (XALATAN) 0.005 % ophthalmic solution Place 1 drop into both eyes at bedtime. 08/12/16   [provider]  levothyroxine (SYNTHROID) 137 MCG tablet Take 1 tablet (137 mcg total) by mouth daily before breakfast. 07/12/21   Hoyt Koch, MD  lidocaine (LIDODERM) 5 % APPLY ONE PATCH TO SKIN EVERY DAY. REMOVE AND DISCARD PATCH WITHIN 12 HOURS OR AS DIRECTED BY MD - Patient taking differently: Place 1 patch onto the skin daily. 06/17/18   Hoyt Koch, MD  loperamide  (IMODIUM A-D) 2 MG tablet Take 2 mg by mouth 4 (four) times daily as needed for diarrhea or loose stools.    [provider]  NEEDLE, DISP, 25 G (BD DISP NEEDLES) 25G X 5/8" MISC Use as directed to administer  the b12  injection monthly 10/05/14   Hoyt Koch, MD  rosuvastatin (CRESTOR) 20 MG tablet Take 1 tablet (20 mg total) by mouth daily. 09/23/21   Hoyt Koch, MD  saccharomyces boulardii (FLORASTOR) 250 MG capsule Take 1 capsule (250 mg total) by mouth 2 (two) times daily. 08/26/16   Florencia Reasons, MD  sodium polystyrene (KAYEXALATE) powder Take 15 mg dose now and another dose in the morning 10/22/21   Elayne Snare, MD  zinc sulfate 220 (50 Zn) MG capsule Take 1 capsule (220 mg total) by mouth daily. 11/23/20   Murlean Iba, MD    PRN MEDs: acetaminophen **OR** acetaminophen, bisacodyl, hydrALAZINE, HYDROmorphone (DILAUDID) injection, ipratropium, levalbuterol, ondansetron **OR** ondansetron (ZOFRAN) IV, oxyCODONE, senna-docusate, sodium phosphate, traZODone  Past Medical History:  Diagnosis Date   Acute bronchitis    Anemia of other chronic disease    Anxiety state, unspecified    Chest pain, unspecified    Diverticulosis of colon (without mention of hemorrhage)    Esophageal reflux    Gallstones    Generalized osteoarthrosis, unspecified site    Headache(784.0)    Lumbago    Neuropathy in diabetes ([place])    bilat LE's   Osteoporosis    Other and unspecified hyperlipidemia    Other B-complex deficiencies    Type II or unspecified type diabetes mellitus without mention of complication, not stated as uncontrolled    Unspecified disorder resulting from impaired renal function    Unspecified essential hypertension    Unspecified hypothyroidism     Past Surgical History:  Procedure Laterality Date   APPENDECTOMY     THYROIDECTOMY       reports that she quit smoking about 48 years ago. Her smoking use included cigarettes. She has a 3.00 pack-year  smoking history. She quit smokeless tobacco use about 11 years ago.  Her smokeless tobacco use included snuff. She reports that she does not drink alcohol and does not use drugs.   Family History  Problem Relation Age of Onset   Diabetes Mother    Cancer Father        unsure ? stomach   Heart attack Brother        x 3   Cancer Sister        unsure type   Lung cancer Brother        smoker   Heart attack Sister     Physical Exam:   Vitals:   10/23/21 1400 10/23/21 1405 10/23/21 1500 10/23/21 1522  BP: (!) 132/101  (!) 123/59   Pulse: 91  93   Resp: (!) 22  19   Temp:  (!) 100.5 F (38.1 C)  99.5 F (37.5 C)  TempSrc:  Rectal  Oral  SpO2: 97%  98%   Weight:      Height:       Constitutional: Confused awake alert x1-  calm, comfortable Eyes: PERRL, lids and conjunctivae normal ENMT: Mucous membranes are moist. Posterior pharynx clear of any exudate or lesions.Normal dentition.  Neck: normal, supple, no masses, no thyromegaly Respiratory: clear to auscultation bilaterally, no wheezing, no crackles. Normal respiratory effort. No accessory muscle use.  Cardiovascular: Regular rate and rhythm, no murmurs / rubs / gallops. No extremity edema. 2+ pedal pulses. No carotid bruits.  Abdomen: no tenderness, no masses palpated. No hepatosplenomegaly. Bowel sounds positive.  Musculoskeletal: no clubbing / cyanosis. No joint deformity upper and lower extremities. Good ROM, no contractures. Normal muscle tone.  Neurologic: CN II-XII grossly intact. Sensation  intact, DTR normal. Strength 5/5 in all 4.  Psychiatric: Normal judgment and insight. Alert and oriented x 3. Normal mood.  Skin: no rashes, lesions, ulcers. No induration Decubitus/ulcers:  Wounds: per nursing documentation         Labs on admission:    I have personally reviewed following labs and imaging studies  CBC: Recent Labs  Lab 10/22/21 1332 10/23/21 1250  WBC 6.7 10.1  NEUTROABS 4.0 7.2  HGB 9.7* 10.2*   HCT 29.2* 32.7*  MCV 95.0 100.3*  PLT 239.0 354   Basic Metabolic Panel: Recent Labs  Lab 10/22/21 1332 10/23/21 1250  NA 135 134*  K 6.1 No hemolysis seen* 6.2*  CL 108 110  CO2 21 16*  GLUCOSE 75 150*  BUN 38* 36*  CREATININE 2.32* 2.28*  CALCIUM 9.6 9.5  MG  --  1.9   GFR: Estimated Creatinine Clearance: 16 mL/min (A) (by C-G formula based on SCr of 2.28 mg/dL (H)). Liver Function Tests: Recent Labs  Lab 10/22/21 1332 10/23/21 1250  AST 23 23  ALT 12 15  ALKPHOS 35* 36*  BILITOT 0.6 1.4*  PROT 7.0 7.6  ALBUMIN 3.6 3.7   No results for input(s): "LIPASE", "AMYLASE" in the last 168 hours. No results for input(s): "AMMONIA" in the last 168 hours. Coagulation Profile: Recent Labs  Lab 10/23/21 1250  INR 1.3*    Urine analysis:    Component Value Date/Time   COLORURINE YELLOW 10/23/2021 1227   APPEARANCEUR HAZY (A) 10/23/2021 1227   LABSPEC 1.011 10/23/2021 1227   PHURINE 6.0 10/23/2021 1227   GLUCOSEU NEGATIVE 10/23/2021 1227   GLUCOSEU NEGATIVE 08/23/2021 1057   HGBUR SMALL (A) 10/23/2021 1227   BILIRUBINUR NEGATIVE 10/23/2021 1227   KETONESUR 5 (A) 10/23/2021 1227   PROTEINUR 30 (A) 10/23/2021 1227   UROBILINOGEN 0.2 08/23/2021 1057   NITRITE NEGATIVE 10/23/2021 1227   LEUKOCYTESUR LARGE (A) 10/23/2021 1227    Last A1C:  Lab Results  Component Value Date   HGBA1C 7.2 (H) 09/04/2021     Radiologic Exams on Admission:   CT ABDOMEN PELVIS WO CONTRAST  Result Date: 10/23/2021 CLINICAL DATA:  Nausea and vomiting.  Sepsis. EXAM: CT ABDOMEN AND PELVIS WITHOUT CONTRAST TECHNIQUE: Multidetector CT imaging of the abdomen and pelvis was performed following the standard protocol without IV contrast. RADIATION DOSE REDUCTION: This exam was performed according to the departmental dose-optimization program which includes automated exposure control, adjustment of the mA and/or kV according to patient size and/or use of iterative reconstruction technique.  COMPARISON:  08/25/2016. FINDINGS: Lower chest: Scarring and volume loss is identified within the left lower lobe. Moderate pericardial fluid is identified along the left heart border measuring 2.3 cm in thickness. Unchanged from previous exam. Hepatobiliary: No suspicious liver lesions. Stones within the neck of the gallbladder are identified. The largest measures 8 mm. No gallbladder wall thickening or inflammation identified. Pancreas: Unremarkable. No pancreatic ductal dilatation or surrounding inflammatory changes. Spleen: Normal in size without focal abnormality. Adrenals/Urinary Tract: Normal adrenal glands. Bilateral renal cortical volume loss and scarring is noted. Bilateral Bosniak class 1 kidney cysts are noted. The largest arises off the anterior cortex of the interpolar right kidney measuring 1.7 cm. The small bilateral nonobstructing renal calculi are noted. These measure up to 2-3 mm. No hydronephrosis or hydroureter identified. There is gas within the lumen of the urinary bladder with air-fluid level, image 77/2. No bladder wall thickening. Stomach/Bowel: Stomach appears normal. The appendix is surgically absent. No small bowel  wall thickening, inflammation, or distension. Wall thickening with intramural fatty deposition is noted involving the ascending colon. No surrounding inflammatory fat stranding or free fluid. The remaining portions of the colon are unremarkable without signs of inflammation, distension or wall thickening. Vascular/Lymphatic: Severe aortic atherosclerosis. No enlarged abdominal or pelvic lymph nodes. Reproductive: Calcified uterine fibroid is noted measuring 2.4 cm. There is a fluid density cyst within the right adnexa measuring 2.2 cm, image 61/2. Previously this was reported at 2.1 cm. Other: No free fluid or fluid collections. No signs of pneumoperitoneum. Musculoskeletal: No acute or significant osseous findings. IMPRESSION: 1. There is gas within the lumen of the urinary  bladder with air-fluid level. Correlate for any clinical signs or symptoms of cystitis. 2. Wall thickening with intramural fatty deposition is noted involving the ascending colon. No surrounding inflammatory fat stranding or free fluid identified. Findings may reflect sequelae of chronic inflammation. 3. Bilateral nonobstructing renal calculi. 4. Gallstones. 5. Moderate pericardial fluid along the left heart border is unchanged from previous exam. 6. Aortic Atherosclerosis (ICD10-I70.0). Electronically Signed   By: Kerby Moors M.D.   On: 10/23/2021 14:16   DG Chest Port 1 View  Result Date: 10/23/2021 CLINICAL DATA:  Weakness. EXAM: PORTABLE CHEST 1 VIEW COMPARISON:  August 23, 2021. FINDINGS: The heart size and mediastinal contours are within normal limits. Both lungs are clear. The visualized skeletal structures are unremarkable. IMPRESSION: No active disease. Aortic Atherosclerosis (ICD10-I70.0). Electronically Signed   By: Marijo Conception M.D.   On: 10/23/2021 13:13    EKG:   Independently reviewed.  Orders placed or performed during the hospital encounter of 10/23/21   ED EKG   ED EKG   EKG 12-Lead   EKG 12-Lead   EKG 12-Lead   ---------------------------------------------------------------------------------------------------------------------------------------    Assessment / Plan:   Principal Problem:   Sepsis ([place]) Active Problems:   Toxic metabolic encephalopathy   Hyperkalemia   Hypothyroidism   B12 deficiency   Anemia, chronic disease   Essential hypertension   GERD   CKD (chronic kidney disease) stage 4, GFR 15-29 ml/min (HCC)   Diabetes (HCC)   Fever   Iron deficiency   HLD (hyperlipidemia)   Assessment and Plan: * Sepsis ([place]) - Met sepsis criteria: Tmax 102.1, RR 24, PR 102, BP 104/40, was satting 86% on arrival, improved to 97% With endorgan compromise a on CKD, creatinine 2.32, potassium 6.2   -Likely likely UTI, urosepsis -previous culture consistent  with Klebsiella.  Sensitive to broad-spectrum antibiotic exception of ampicillin -Received a dose of Rocephin, will be switched to cefepime -We will follow-up with urine and blood cultures -Sepsis protocol initiated: IV fluid switched from LR to NS due to hyperkalemia -We will monitor closely on stepdown unit  Hyperkalemia - Serum potassium 6.1, 6.2 -With peak T waves, no other EKG changes -1 L of normal saline was given, Kayexalate and calcium gluconate in ED -Rechecking serum potassium level, continuing aggressive IV fluid resuscitation, another gram of calcium gluconate will be given -We will monitor on telemetry bed closely   Toxic metabolic encephalopathy - Likely due to sepsis - Currently awake alert oriented x1 -Monitoring closely, continue treating underlying causes of sepsis, UTI -Avoiding hallucinogenic's, and narcotics -N.p.o. for now  HLD (hyperlipidemia) - Due to altered mental status, n.p.o. and for now -We will resume home medication of Crestor once tolerating p.o.  Iron deficiency - We will continue iron supplements once tolerating p.o. -Monitoring H&H, stable at baseline  Fever - Due to sepsis  UTI-continue with the as needed Tylenol   Diabetes ([place]) - We will holding home regimen -NPO -  checking CBG every 4 hours with SSI coverage once tolerating p.o. q. ACHS with SSI coverage  CKD (chronic kidney disease) stage 4, GFR 15-29 ml/min (HCC) - Baseline creatinine 1.6-1.8  -Baseline BUN 26- 34, 36 today -GFR baseline around 30 --- 18.28 today -Acute on chronic kidney disease: Creatinine 2.32 >> 2.28  GERD - Continue PPI  Essential hypertension - Due to sepsis, ruling out severe sepsis and hypotension -We will holding home BP medication of Norvasc for now -We will monitor BP closely -As needed hydralazine will be utilized  Anemia, chronic disease - Baseline hemoglobin around 11 -Hemoglobin medically stable -Hemoglobin 10.2 today  B12 deficiency -  Once tolerating p.o., continuing B12 supplements  Hypothyroidism - Continue p.o. Synthroid once tolerating p.o. -Checking TSH      Consults called:  None -------------------------------------------------------------------------------------------------------------------------------------------- DVT prophylaxis:  heparin injection 5,000 Units Start: 10/23/21 1500 TED hose Start: 10/23/21 1450 SCDs Start: 10/23/21 1450   Code Status:   Code Status: Full Code   Admission status: Patient will be admitted as Inpatient, with a greater than 2 midnight length of stay. Level of care: Stepdown   Family Communication:  none at bedside  (The above findings and plan of care has been discussed with patient in detail, the patient expressed understanding and agreement of above plan)  --------------------------------------------------------------------------------------------------------------------------------------------------  Disposition Plan:  Anticipated 1-2 days Status is: Inpatient Remains inpatient appropriate because: Meeting sepsis criteria, requiring IV fluids, IV antibiotics and close monitoring in stepdown unit.     ----------------------------------------------------------------------------------------------------------------------------------------------------  Time spent: > than  47  Min.  Stabilizing patient, reviewing all medical records, drawn plan of care, discussion with family regarding treatment plan and goals of care  SIGNED: Deatra James, MD, FHM. [facility],  Pager (Please use [email or web address] to page to text)  If 7PM-7AM, please contact night-coverage [email or web address],  10/23/2021, 3:35 PM

## 2021-10-23 NOTE — Assessment & Plan Note (Addendum)
-   resume Crestor

## 2021-10-23 NOTE — Assessment & Plan Note (Signed)
Continue PPI ?

## 2021-10-23 NOTE — Progress Notes (Signed)
Pharmacy Antibiotic Note  Jocelyn Mendez a 88 y.o. female admitted on 10/23/2021 with sepsis and UTI.  Pharmacy has been consulted for cefepime dosing.  Plan: Cefepime 2gm IV every 24 hours.  Medical History: Past Medical History:  Diagnosis Date   Acute bronchitis    Anemia of other chronic disease    Anxiety state, unspecified    Chest pain, unspecified    Diverticulosis of colon (without mention of hemorrhage)    Esophageal reflux    Gallstones    Generalized osteoarthrosis, unspecified site    Headache(784.0)    Lumbago    Neuropathy in diabetes ([place])    bilat LE's   Osteoporosis    Other and unspecified hyperlipidemia    Other B-complex deficiencies    Type II or unspecified type diabetes mellitus without mention of complication, not stated as uncontrolled    Unspecified disorder resulting from impaired renal function    Unspecified essential hypertension    Unspecified hypothyroidism     Allergies:  Allergies  Allergen Reactions   Codeine Other (See Comments)    REACTION: change in mental status   Diphenhydramine Hcl Itching   Quinine Other (See Comments)    REACTION: abnormal sensations in face   Sulfonamide Derivatives Rash    Filed Weights   10/23/21 1222  Weight: 65.3 kg (144 lb)       Latest Ref Rng & Units 10/23/2021   12:50 PM 10/22/2021    1:32 PM 08/23/2021   10:57 AM  CBC  WBC 4.0 - 10.5 K/uL 10.1  6.7  6.3   Hemoglobin 12.0 - 15.0 g/dL 10.2  9.7  11.1   Hematocrit 36.0 - 46.0 % 32.7  29.2  33.9   Platelets 150 - 400 K/uL 244  239.0  354.0      Estimated Creatinine Clearance: 16 mL/min (A) (by C-G formula based on SCr of 2.28 mg/dL (H)).  Antibiotics Given (last 72 hours)     Date/Time Action Medication Dose Rate   10/23/21 1458 New Bag/Given   cefTRIAXone (ROCEPHIN) 1 g in sodium chloride 0.9 % 100 mL IVPB 1 g 200 mL/hr       Antimicrobials this admission:  Cefepime 10/23/2021  >>   Microbiology results: 10/23/2021  BCx:  sent 10/23/2021  UCx: sent 10/23/2021  Resp Panel: sent   Thank you for allowing pharmacy to be a part of this patient's care.  Thomasenia Sales, PharmD Clinical Pharmacist

## 2021-10-23 NOTE — Assessment & Plan Note (Addendum)
-   Serum potassium 6.1, 6.2 >>>> 5.9 this a.m.  -On arrival 6.2, peaked T waves, treated with Kayexalate, calcium gluconate,  -Another dose of calcium gluconate will be given today, along with Lasix  -Monitoring potassium level closely -Considering Lokelma  -We will monitor on telemetry bed closely

## 2021-10-23 NOTE — Assessment & Plan Note (Addendum)
-  Hypotensive-resolved, blood pressure stabilizing  - Due to sepsis, ruling out severe sepsis and hypotension -Patient is to resume home medication of Norvasc

## 2021-10-23 NOTE — Telephone Encounter (Signed)
I do not see any follow up scheduled yet with our office but would advise given her chronic CKD she is at higher risk for true high potassium and at risk for cardiac event and would recommend ER or urgent care if unable to be seen today somewhere.

## 2021-10-23 NOTE — Assessment & Plan Note (Addendum)
-   We will continue iron supplements  -Monitoring H&H

## 2021-10-24 DIAGNOSIS — N184 Chronic kidney disease, stage 4 (severe): Secondary | ICD-10-CM | POA: Diagnosis not present

## 2021-10-24 DIAGNOSIS — A419 Sepsis, unspecified organism: Secondary | ICD-10-CM | POA: Diagnosis not present

## 2021-10-24 DIAGNOSIS — D638 Anemia in other chronic diseases classified elsewhere: Secondary | ICD-10-CM | POA: Diagnosis not present

## 2021-10-24 DIAGNOSIS — E538 Deficiency of other specified B group vitamins: Secondary | ICD-10-CM | POA: Diagnosis not present

## 2021-10-24 LAB — COMPREHENSIVE METABOLIC PANEL WITH GFR
ALT: 13 U/L (ref 0–44)
AST: 20 U/L (ref 15–41)
Albumin: 3.3 g/dL — ABNORMAL LOW (ref 3.5–5.0)
Alkaline Phosphatase: 30 U/L — ABNORMAL LOW (ref 38–126)
Anion gap: 6 (ref 5–15)
BUN: 32 mg/dL — ABNORMAL HIGH (ref 8–23)
CO2: 17 mmol/L — ABNORMAL LOW (ref 22–32)
Calcium: 9.6 mg/dL (ref 8.9–10.3)
Chloride: 111 mmol/L (ref 98–111)
Creatinine, Ser: 2 mg/dL — ABNORMAL HIGH (ref 0.44–1.00)
GFR, Estimated: 24 mL/min — ABNORMAL LOW (ref >60)
Glucose, Bld: 191 mg/dL — ABNORMAL HIGH (ref 70–99)
Potassium: 5.9 mmol/L — ABNORMAL HIGH (ref 3.5–5.1)
Sodium: 134 mmol/L — ABNORMAL LOW (ref 135–145)
Total Bilirubin: 0.8 mg/dL (ref 0.3–1.2)
Total Protein: 6.9 g/dL (ref 6.5–8.1)

## 2021-10-24 LAB — APTT: aPTT: 39 seconds — ABNORMAL HIGH (ref 24–36)

## 2021-10-24 LAB — BASIC METABOLIC PANEL
Anion gap: 4 — ABNORMAL LOW (ref 5–15)
BUN: 27 mg/dL — ABNORMAL HIGH (ref 8–23)
CO2: 16 mmol/L — ABNORMAL LOW (ref 22–32)
Calcium: 8 mg/dL — ABNORMAL LOW (ref 8.9–10.3)
Chloride: 118 mmol/L — ABNORMAL HIGH (ref 98–111)
Creatinine, Ser: 1.71 mg/dL — ABNORMAL HIGH (ref 0.44–1.00)
GFR, Estimated: 28 mL/min — ABNORMAL LOW (ref >60)
Glucose, Bld: 125 mg/dL — ABNORMAL HIGH (ref 70–99)
Potassium: 4.3 mmol/L (ref 3.5–5.1)
Sodium: 138 mmol/L (ref 135–145)

## 2021-10-24 LAB — CBC
HCT: 29.4 % — ABNORMAL LOW (ref 36.0–46.0)
Hemoglobin: 9.1 g/dL — ABNORMAL LOW (ref 12.0–15.0)
MCH: 31.1 pg (ref 26.0–34.0)
MCHC: 31 g/dL (ref 30.0–36.0)
MCV: 100.3 fL — ABNORMAL HIGH (ref 80.0–100.0)
Platelets: 237 10*3/uL (ref 150–400)
RBC: 2.93 MIL/uL — ABNORMAL LOW (ref 3.87–5.11)
RDW: 14.6 % (ref 11.5–15.5)
WBC: 5.7 10*3/uL (ref 4.0–10.5)
nRBC: 0 % (ref 0.0–0.2)

## 2021-10-24 LAB — GLUCOSE, CAPILLARY
Glucose-Capillary: 187 mg/dL — ABNORMAL HIGH (ref 70–99)
Glucose-Capillary: 166 mg/dL — ABNORMAL HIGH (ref 70–99)
Glucose-Capillary: 123 mg/dL — ABNORMAL HIGH (ref 70–99)
Glucose-Capillary: 153 mg/dL — ABNORMAL HIGH (ref 70–99)
Glucose-Capillary: 112 mg/dL — ABNORMAL HIGH (ref 70–99)

## 2021-10-24 LAB — PROTIME-INR
INR: 1.4 — ABNORMAL HIGH (ref 0.8–1.2)
Prothrombin Time: 16.8 seconds — ABNORMAL HIGH (ref 11.4–15.2)

## 2021-10-24 MED ORDER — SODIUM CHLORIDE 0.9 % IV BOLUS
1000.0000 mL | Freq: Once | INTRAVENOUS | Status: AC
  Administered 2021-10-24: 1000 mL via INTRAVENOUS

## 2021-10-24 MED ORDER — INSULIN ASPART 100 UNIT/ML IJ SOLN
0.0000 [IU] | Freq: Three times a day (TID) | INTRAMUSCULAR | Status: DC
  Administered 2021-10-24: 1 [IU] via SUBCUTANEOUS
  Administered 2021-10-24: 2 [IU] via SUBCUTANEOUS
  Administered 2021-10-25 – 2021-10-27 (×4): 1 [IU] via SUBCUTANEOUS

## 2021-10-24 MED ORDER — FUROSEMIDE 10 MG/ML IJ SOLN
40.0000 mg | Freq: Once | INTRAMUSCULAR | Status: AC
  Administered 2021-10-24: 40 mg via INTRAVENOUS
  Filled 2021-10-24: qty 4

## 2021-10-24 MED ORDER — CALCIUM GLUCONATE-NACL 1-0.675 GM/50ML-% IV SOLN
1.0000 g | Freq: Once | INTRAVENOUS | Status: AC
  Administered 2021-10-24: 1000 mg via INTRAVENOUS
  Filled 2021-10-24: qty 50

## 2021-10-24 NOTE — Progress Notes (Signed)
Notified Dr. [name] Shelter of 35 beat run vtach.

## 2021-10-24 NOTE — TOC Initial Note (Signed)
Transition of Care [facility]) - Initial/Assessment Note    Patient Details  Name: Jocelyn Mendez MRN: [id number] Date of Birth: 1930-03-24  Transition of [facility]) CM/SW Contact:    Iona Beard, [place] Phone Number: 10/24/2021, 3:33 PM  Clinical Narrative:                 Pt is high risk for readmission. CSW awaiting PT evaluation prior to completing assessment for D/C needs. TOC to follow.    Barriers to Discharge: Continued Medical Work up   Patient Goals and CMS Choice        Expected Discharge Plan and Services                                                Prior Living Arrangements/Services                       Activities of Daily Living Home Assistive Devices/Equipment: CBG Meter, Dentures (specify type), Eyeglasses, Walker (specify type) ADL Screening (condition at time of admission) Patient's cognitive ability adequate to safely complete daily activities?: No Is the patient deaf or have difficulty hearing?: Yes Does the patient have difficulty seeing, even when wearing glasses/contacts?: No Does the patient have difficulty concentrating, remembering, or making decisions?: Yes Patient able to express need for assistance with ADLs?: No Does the patient have difficulty dressing or bathing?: Yes Independently performs ADLs?: No Communication: Independent Dressing (OT): Independent Grooming: Independent Feeding: Independent Bathing: Needs assistance Is this a change from baseline?: Pre-admission baseline Toileting: Needs assistance Is this a change from baseline?: Pre-admission baseline In/Out Bed: Independent with device (comment) Walks in Home: Independent with device (comment) Does the patient have difficulty walking or climbing stairs?: Yes Weakness of Legs: None Weakness of Arms/Hands: None  Permission Sought/Granted                  Emotional Assessment              Admission diagnosis:  Sepsis ([place]) [A41.9] Patient  Active Problem List   Diagnosis Date Noted   Sepsis ([place]) 10/23/2021   HLD (hyperlipidemia) 10/23/2021   Hyperkalemia 10/23/2021    Class: Acute   Weakness 08/23/2021   Pain due to onychomycosis of toenails of both feet 02/26/2021   Iron deficiency 12/05/2020   Elevated AST (SGOT) 11/20/2020   Urge incontinence 11/20/2020   Routine general medical examination at a health care facility 03/05/2019   Left breast lump 12/08/2017   Allergic rhinitis 12/08/2017   Fever 08/27/2016   Asthma 68/01/7516   Toxic metabolic encephalopathy 00/17/4944   Diabetes ([place]) 03/28/2015   Gait abnormality 05/13/2010   B12 deficiency 11/23/2007   Anemia, chronic disease 01/21/2007   CKD (chronic kidney disease) stage 4, GFR 15-29 ml/min ([place]) 01/21/2007   Hypothyroidism 11/21/2006   Essential hypertension 11/21/2006   GERD 11/21/2006   DEGENERATIVE JOINT DISEASE, GENERALIZED 11/21/2006   Chronic bilateral low back pain without sciatica 11/21/2006   Osteoporosis 11/21/2006   PCP:  Hoyt Koch, MD Pharmacy:   [place], [place] [address] [place] [address] Phone: [phone] Fax: [phone]     Social Determinants of Health (SDOH) Interventions Housing Interventions: Intervention Not Indicated  Readmission Risk Interventions     No data to display

## 2021-10-24 NOTE — Evaluation (Signed)
Occupational Therapy Evaluation Patient Details Name: Jocelyn Mendez MRN: [id number] DOB: 24-Sep-1930 Today's Date: 10/24/2021   History of Present Illness Jocelyn Mendez is a 88 year old female presenting from home with altered mental status.  Medical history of DM 2 (last A1c 6.2 in July 23), HTN, HLD, CKD stage 4, GERD, hypothyroidism, depression, iron deficiency anemia, and asthma.        Patient is accompanied by 2 sons who reported patient "got sick " very quickly overnight, developed fever, more confused now.  It looks like she was going to pass out.  She saw her endocrinologist yesterday who started her on potassium low-dose and advised to go to PCP.  Patient had some nausea and abdominal pain but no vomiting denied have diarrhea constipation   she has been complaining of dysuria and urinary incontinency..  Her urine has been smelling foul odor.   Clinical Impression   Pt sleeping at start of session. Pt woke and was agreeable to OT evaluation. Pt requires min to mod A to pull to sit at EOB. Limited mildly in B UE A/ROM likely at baseline. Pt demonstrated poor endurance by need to sit after ~3 steps forward and backward at EOB with RW. Pt is generally weak. Unsure of pt's level of support at home due to non family being present to confirm history. Pt recommended for SNF due to weakness and poor balance combined with unknown level of support at home. Pt will benefit from continued OT in the hospital and recommended venue below to increase strength, balance, and endurance for safe ADL's.         Recommendations for follow up therapy are one component of a multi-disciplinary discharge planning process, led by the attending physician.  Recommendations may be updated based on patient status, additional functional criteria and insurance authorization.   Follow Up Recommendations  Skilled nursing-short term rehab (<3 hours/day)    Assistance Recommended at Discharge Frequent or constant  Supervision/Assistance  Patient can return home with the following A little help with walking and/or transfers;A little help with bathing/dressing/bathroom;Assistance with cooking/housework;Assist for transportation;Help with stairs or ramp for entrance;Direct supervision/assist for medications management    Functional Status Assessment  Patient has had a recent decline in their functional status and demonstrates the ability to make significant improvements in function in a reasonable and predictable amount of time.  Equipment Recommendations  None recommended by OT    Recommendations for Other Services       Precautions / Restrictions Precautions Precautions: Fall Restrictions Weight Bearing Restrictions: No      Mobility Bed Mobility Overal bed mobility: Needs Assistance Bed Mobility: Supine to Sit, Sit to Supine     Supine to sit: Min assist, Mod assist Sit to supine: Min guard   General bed mobility comments: Slow labored movement. Assist to pull to sit and rotate B LE to edge of bed.    Transfers Overall transfer level: Needs assistance Equipment used: Rolling walker (2 wheels) Transfers: Sit to/from Stand Sit to Stand: Min guard, Min assist           General transfer comment: Using RW taking a few steps forward and backwards near bed. Pt quick to fatigue needing to sit.      Balance Overall balance assessment: Needs assistance Sitting-balance support: Bilateral upper extremity supported, Feet supported Sitting balance-Leahy Scale: Fair Sitting balance - Comments: seated EOB   Standing balance support: Bilateral upper extremity supported, During functional activity, Reliant on assistive device for balance Standing  balance-Leahy Scale: Poor Standing balance comment: poor to fair with RW                           ADL either performed or assessed with clinical judgement   ADL Overall ADL's : Needs assistance/impaired Eating/Feeding: Supervision/  safety;Bed level   Grooming: Min guard;Sitting;Minimal assistance   Upper Body Bathing: Min guard;Minimal assistance;Sitting   Lower Body Bathing: Min guard;Sitting/lateral leans;Minimal assistance   Upper Body Dressing : Min guard;Sitting   Lower Body Dressing: Min guard;Sitting/lateral leans Lower Body Dressing Details (indicate cue type and reason): Min G assist while seated at EOB donning and doffing socks. Toilet Transfer: Min guard;Minimal assistance;Stand-pivot;Rolling walker (2 wheels) Toilet Transfer Details (indicate cue type and reason): Partially simulated via sit to stand and a few steps forward and backwards beside bed. Toileting- Water quality scientist and Hygiene: Min guard;Minimal assistance;Sit to/from stand       Functional mobility during ADLs: Minimal assistance;Rolling walker (2 wheels)       Vision Baseline Vision/History: 0 No visual deficits Ability to See in Adequate Light: 0 Adequate Patient Visual Report: No change from baseline Vision Assessment?: No apparent visual deficits                Pertinent Vitals/Pain Pain Assessment Pain Assessment: No/denies pain     Hand Dominance Right   Extremity/Trunk Assessment Upper Extremity Assessment Upper Extremity Assessment: Generalized weakness (Limited to ~75% of typical A/ROM of shoudler flexion. Kyhposis may limit range. Generally weak.)   Lower Extremity Assessment Lower Extremity Assessment: Defer to PT evaluation   Cervical / Trunk Assessment Cervical / Trunk Assessment: Kyphotic   Communication Communication Communication: No difficulties   Cognition Arousal/Alertness: Awake/alert Behavior During Therapy: WFL for tasks assessed/performed Overall Cognitive Status: No family/caregiver present to determine baseline cognitive functioning                                                        Home Living Family/patient expects to be discharged to:: Private  residence Living Arrangements: Children Available Help at Discharge: Family Type of Home: House Home Access: Stairs to enter Technical brewer of Steps: 1   Home Layout: One level         Biochemist, clinical: Standard Bathroom Accessibility: Yes   Home Equipment: Conservation officer, nature (2 wheels);Shower seat;Grab bars - tub/shower   Additional Comments: Information taken from chart review and some pt report. Unsure of accuracy of pt report at this time.      Prior Functioning/Environment Prior Level of Function : Needs assist       Physical Assist : ADLs (physical)   ADLs (physical): Bathing;IADLs Mobility Comments: Pt reports using RW at baseline. ADLs Comments: Pt reports assist for bathing and indepndence for other ADL's. Family likely assisting with IADL's.        OT Problem List: Decreased strength;Decreased range of motion;Decreased activity tolerance;Impaired balance (sitting and/or standing)      OT Treatment/Interventions: Self-care/ADL training;Therapeutic exercise;Therapeutic activities;Patient/family education;Balance training    OT Goals(Current goals can be found in the care plan section) Acute Rehab OT Goals Patient Stated Goal: Open to rehabe to get stronger. OT Goal Formulation: With patient Time For Goal Achievement: 11/07/21 Potential to Achieve Goals: Fair  OT Frequency: Min 2X/week  End of Session Equipment Utilized During Treatment: Rolling walker (2 wheels) Nurse Communication: Mobility status;Other (comment) (NT notified of mobility status and pt attempting to eat in bed.)  Activity Tolerance: Patient tolerated treatment well Patient left: in bed;with call bell/phone within reach;with bed alarm set  OT Visit Diagnosis: Unsteadiness on feet (R26.81);Other abnormalities of gait and mobility (R26.89);Muscle weakness (generalized) (M62.81)                Time: 2072-1828 OT Time Calculation (min): 16  min Charges:  OT General Charges $OT Visit: 1 Visit OT Evaluation $OT Eval Low Complexity: 1 Low  Makael Stein OT, MOT  Larey Seat 10/24/2021, 3:47 PM

## 2021-10-24 NOTE — Plan of Care (Signed)
  Problem: Acute Rehab OT Goals (only OT should resolve) Goal: Pt. Will Perform Grooming Flowsheets (Taken 10/24/2021 1549) Pt Will Perform Grooming:  with modified independence  standing Goal: Pt. Will Perform Lower Body Bathing Flowsheets (Taken 10/24/2021 1549) Pt Will Perform Lower Body Bathing:  with min guard assist  sitting/lateral leans Goal: Pt. Will Perform Upper Body Dressing Flowsheets (Taken 10/24/2021 1549) Pt Will Perform Upper Body Dressing:  with modified independence  sitting Goal: Pt. Will Perform Lower Body Dressing Flowsheets (Taken 10/24/2021 1549) Pt Will Perform Lower Body Dressing:  with modified independence  sitting/lateral leans Goal: Pt. Will Transfer To Toilet Flowsheets (Taken 10/24/2021 1549) Pt Will Transfer to Toilet:  with modified independence  ambulating Goal: Pt/Caregiver Will Perform Home Exercise Program Flowsheets (Taken 10/24/2021 1549) Pt/caregiver will Perform Home Exercise Program:  Increased strength  Both right and left upper extremity  Independently  Nnaemeka Samson OT, MOT

## 2021-10-24 NOTE — Progress Notes (Signed)
PROGRESS NOTE    Patient: Jocelyn Mendez                            PCP: Hoyt Koch, MD                    DOB: 02-06-1931            DOA: 10/23/2021 KVQ:[id number]             DOS: 10/24/2021, 11:41 AM   LOS: 1 day   Date of Service: The patient was seen and examined on 10/24/2021  Subjective:   The patient was seen and examined this morning. Lethargic but arousable, able to communicate Blood pressure remains soft 96/76 this morning satting 100% on room air Hemodynamically stable this morning  Brief Narrative:   JAYME MEDNICK is a 88 year old female presenting from home with altered mental status.  Medical history of DM 2 (last A1c 6.2 in July 23), HTN, HLD, CKD stage 4, GERD, hypothyroidism, depression, iron deficiency anemia, and asthma.   Patient is accompanied by 2 sons who reported patient "got sick " very quickly overnight, developed fever, more confused now.  It looks like she was going to pass out.  She saw her endocrinologist yesterday who started her on potassium low-dose and advised to go to PCP.  Patient had some nausea and abdominal pain but no vomiting denied have diarrhea constipation  she has been complaining of dysuria and urinary incontinency..  Her urine has been smelling foul odor.     ED course: Blood pressure (!) 123/59, pulse 93, temperature 99.5 F (37.5 C), temperature source Oral, resp. rate 19, height '5\' 6"'  (1.676 m), weight 65.3 kg, SpO2 98 %.     Latest Ref Rng & Units 10/23/2021   12:50 PM 10/22/2021    1:32 PM 09/04/2021    2:25 PM  CMP  Glucose 70 - 99 mg/dL 150  75  105   BUN 8 - 23 mg/dL 36  38    Creatinine 0.44 - 1.00 mg/dL 2.28  2.32    Sodium 135 - 145 mmol/L 134  135    Potassium 3.5 - 5.1 mmol/L 6.2  6.1 No hemolysis seen    Chloride 98 - 111 mmol/L 110  108    CO2 22 - 32 mmol/L 16  21    Calcium 8.9 - 10.3 mg/dL 9.5  9.6    Total Protein 6.5 - 8.1 g/dL 7.6  7.0    Total Bilirubin 0.3 - 1.2 mg/dL 1.4  0.6    Alkaline  Phos 38 - 126 U/L 36  35    AST 15 - 41 U/L 23  23    ALT 0 - 44 U/L 15  12      UA positive for large leukocyte esterase, many bacteria, > 50 WBCs  -On arrival: met sepsis criteria: Tmax 102.1, RR 24, PR 102, BP 104/40, was satting 86% on arrival, improved to 97% With endorgan compromise a on CKD, creatinine 2.32, potassium 6.2  Patient was given a liter of IV normal saline, IV Rocephin, calcium gluconate 1 g Blood and urine cultures were obtained  Requested patient to be admitted for urosepsis     Assessment & Plan:   Principal Problem:   Sepsis ([facility]) Active Problems:   Toxic metabolic encephalopathy   Hyperkalemia   Anemia, chronic disease   Hypothyroidism   B12 deficiency   Essential  hypertension   GERD   CKD (chronic kidney disease) stage 4, GFR 15-29 ml/min (HCC)   Diabetes (HCC)   Fever   Iron deficiency   HLD (hyperlipidemia)     Assessment and Plan: * Sepsis ([place]) Today: BP 96/76, pulse 91, temp. 98.8 F (37.1 C), RR 18,  weight 62.7 kg, SpO2 100 %.   -On admission: met sepsis criteria: Tmax 102.1, RR 24, PR 102, BP 104/40, was satting 86% on arrival, improved to 97% With endorgan compromise a on CKD, creatinine 2.32, potassium 6.2   -Likely likely UTI, urosepsis -previous culture consistent with Klebsiella.  Sensitive to broad-spectrum antibiotic exception of ampicillin -Received a dose of Rocephin, will be switched to cefepime -Continue IV antibiotic of cefepime -Urine culture>> -Blood cultures>>  -Sepsis protocol initiated: IV fluid switched from LR to NS due to hyperkalemia -Patient will be transferred out of ICU today  Hyperkalemia - Serum potassium 6.1, 6.2 >>>> 5.9 this a.m.  -On arrival 6.2, peaked T waves, treated with Kayexalate, calcium gluconate,  -Another dose of calcium gluconate will be given today, along with Lasix  -Monitoring potassium level closely -Considering Lokelma  -We will monitor on telemetry bed closely   Toxic  metabolic encephalopathy - Likely due to sepsis -Mentation is much improved  -Monitoring closely, continue treating underlying causes of sepsis, UTI -Avoiding hallucinogenic's, and narcotics   Anemia, chronic disease - Baseline hemoglobin around 11 -Hemoglobin medically stable -Hemoglobin 10.2 today  HLD (hyperlipidemia) - Due to altered mental status, n.p.o. and for now -We will resume Crestor   Iron deficiency - We will continue iron supplements once tolerating p.o. -Monitoring H&H, stable at baseline  Fever - Resolved - Due to sepsis UTI-continue with the as needed Tylenol   Diabetes (HCC) - We will holding home regimen -Resuming and checking CBG q. ACHS with SSI coverage   CKD (chronic kidney disease) stage 4, GFR 15-29 ml/min (HCC) - Baseline creatinine 1.6-1.8  -Baseline BUN 26- 34, 36 today -GFR baseline around 30 --- 18.28 today -Acute on CKD stage 4- Creatinine 2.32 >> 2.28 >> 2.00   GERD - Continue PPI  Essential hypertension -Hypotensive - Due to sepsis, ruling out severe sepsis and hypotension -We will holding home BP medication of Norvasc for now -We will monitor BP closely -As needed hydralazine will be utilized  B12 deficiency - Once tolerating p.o., continuing B12 supplements  Hypothyroidism - Continue p.o. Synthroid once tolerating p.o. -Checking TSH            ----------------------------------------------------------------------------------------------------------------------------------------------- Nutritional status:  The patient's BMI is: Body mass index is 22.31 kg/m. I agree with the assessment and plan as outlined --------------------------------------------------------------------------------------------------------------------------------------------- Cultures; Blood Cultures x 2 >> NGT Urine Culture  >>> NGT   --------------------------------------------------------------------------------------------------------------------------------------------  DVT prophylaxis:  heparin injection 5,000 Units Start: 10/23/21 1500 TED hose Start: 10/23/21 1450 SCDs Start: 10/23/21 1450   Code Status:   Code Status: Full Code  Family Communication: No family member present at bedside- attempt will be made to update daily The above findings and plan of care has been discussed with patient (and family)  in detail,  they expressed understanding and agreement of above. -Advance care planning has been discussed.   Admission status:   Status is: Inpatient Remains inpatient appropriate because: Required treatment for urosepsis, IV antibiotics, IV fluids,     Procedures:   No admission procedures for hospital encounter.   Antimicrobials:  Anti-infectives (From admission, onward)    Start     Dose/Rate Route Frequency  Ordered Stop   10/23/21 1500  ceFEPIme (MAXIPIME) 2 g in sodium chloride 0.9 % 100 mL IVPB  Status:  Discontinued        2 g 200 mL/hr over 30 Minutes Intravenous  Once 10/23/21 1453 10/23/21 1456   10/23/21 1500  ceFEPIme (MAXIPIME) 2 g in sodium chloride 0.9 % 100 mL IVPB        2 g 200 mL/hr over 30 Minutes Intravenous Every 24 hours 10/23/21 1456     10/23/21 1445  cefTRIAXone (ROCEPHIN) 1 g in sodium chloride 0.9 % 100 mL IVPB        1 g 200 mL/hr over 30 Minutes Intravenous  Once 10/23/21 1432 10/23/21 1530        Medication:   acidophilus  1 capsule Oral TID WC   aspirin EC  81 mg Oral Daily   Chlorhexidine Gluconate Cloth  6 each Topical Daily   gabapentin  100 mg Oral TID   heparin  5,000 Units Subcutaneous Q8H   insulin aspart  0-9 Units Subcutaneous TID WC   iron polysaccharides  150 mg Oral Daily   latanoprost  1 drop Both Eyes QHS   levothyroxine  137 mcg Oral QAC breakfast   sodium chloride flush  3 mL Intravenous Q12H    acetaminophen **OR** acetaminophen,  bisacodyl, hydrALAZINE, HYDROmorphone (DILAUDID) injection, ipratropium, levalbuterol, ondansetron **OR** ondansetron (ZOFRAN) IV, mouth rinse, oxyCODONE, senna-docusate, sodium phosphate, traZODone   Objective:   Vitals:   10/24/21 0717 10/24/21 0827 10/24/21 1111 10/24/21 1125  BP:  (!) 152/39 96/76   Pulse: 90 92 92 91  Resp: (!) '24 19 19 18  ' Temp: 98.8 F (37.1 C)   98.8 F (37.1 C)  TempSrc: Oral   Oral  SpO2: 99% 100% 100% 100%  Weight:      Height:        Intake/Output Summary (Last 24 hours) at 10/24/2021 1141 Last data filed at 10/24/2021 7425 Gross per 24 hour  Intake 1944.07 ml  Output 1350 ml  Net 594.07 ml   Filed Weights   10/23/21 1222 10/23/21 1545 10/24/21 0500  Weight: 65.3 kg 63.5 kg 62.7 kg     Examination:   Physical Exam  Constitution: Elderly looking female, alert, cooperative, no distress,  Appears calm and comfortable  Psychiatric:   Normal and stable mood and affect, cognition intact,   HEENT:        Normocephalic, PERRL, otherwise with in Normal limits  Chest:         Chest symmetric Cardio vascular:  S1/S2, RRR, No murmure, No Rubs or Gallops  pulmonary: Clear to auscultation bilaterally, respirations unlabored, negative wheezes / crackles Abdomen: Soft, non-tender, non-distended, bowel sounds,no masses, no organomegaly Muscular skeletal: Limited exam - in bed, able to move all 4 extremities,   Global generalized weaknesses Neuro: CNII-XII intact. , normal motor and sensation, reflexes intact  Extremities: No pitting edema lower extremities, +2 pulses  Skin: Dry, warm to touch, negative for any Rashes, No open wounds Wounds: per nursing documentation   ------------------------------------------------------------------------------------------------------------------------------------------    LABs:     Latest Ref Rng & Units 10/24/2021    3:30 AM 10/23/2021   12:50 PM 10/22/2021    1:32 PM  CBC  WBC 4.0 - 10.5 K/uL 5.7  10.1  6.7    Hemoglobin 12.0 - 15.0 g/dL 9.1  10.2  9.7   Hematocrit 36.0 - 46.0 % 29.4  32.7  29.2   Platelets 150 - 400 K/uL 237  244  239.0       Latest Ref Rng & Units 10/24/2021    3:30 AM 10/23/2021    7:18 PM 10/23/2021    3:29 PM  CMP  Glucose 70 - 99 mg/dL 191   157   BUN 8 - 23 mg/dL 32   36   Creatinine 0.44 - 1.00 mg/dL 2.00   2.30   Sodium 135 - 145 mmol/L 134   134   Potassium 3.5 - 5.1 mmol/L 5.9  5.9  6.5   Chloride 98 - 111 mmol/L 111   111   CO2 22 - 32 mmol/L 17   13   Calcium 8.9 - 10.3 mg/dL 9.6   9.7   Total Protein 6.5 - 8.1 g/dL 6.9     Total Bilirubin 0.3 - 1.2 mg/dL 0.8     Alkaline Phos 38 - 126 U/L 30     AST 15 - 41 U/L 20     ALT 0 - 44 U/L 13          Micro Results Recent Results (from the past 240 hour(s))  Culture, blood (Routine x 2)     Status: None (Preliminary result)   Collection Time: 10/23/21 12:51 PM   Specimen: BLOOD  Result Value Ref Range Status   Specimen Description BLOOD LEFT ANTECUBITAL  Final   Special Requests   Final    BOTTLES DRAWN AEROBIC AND ANAEROBIC Blood Culture results may not be optimal due to an inadequate volume of blood received in culture bottles Performed at [facility], [address], [place], [place] [address]    Culture PENDING  Incomplete   Report Status PENDING  Incomplete  SARS Coronavirus 2 by RT PCR (hospital order, performed in [place] hospital lab) *cepheid single result test* Anterior Nasal Swab     Status: None   Collection Time: 10/23/21  1:01 PM   Specimen: Anterior Nasal Swab  Result Value Ref Range Status   SARS Coronavirus 2 by RT PCR NEGATIVE NEGATIVE Final    Comment: (NOTE) SARS-CoV-2 target nucleic acids are NOT DETECTED.  The SARS-CoV-2 RNA is generally detectable in upper and lower respiratory specimens during the acute phase of infection. The lowest concentration of SARS-CoV-2 viral copies this assay can detect is 250 copies / mL. A negative result does not preclude SARS-CoV-2  infection and should not be used as the sole basis for treatment or other patient management decisions.  A negative result may occur with improper specimen collection / handling, submission of specimen other than nasopharyngeal swab, presence of viral mutation(s) within the areas targeted by this assay, and inadequate number of viral copies (<250 copies / mL). A negative result must be combined with clinical observations, patient history, and epidemiological information.  Fact Sheet for Patients:   https://www.patel.info/  Fact Sheet for Healthcare Providers: https://hall.com/  This test is not yet approved or  cleared by the [place] FDA and has been authorized for detection and/or diagnosis of SARS-CoV-2 by FDA under an Emergency Use Authorization (EUA).  This EUA will remain in effect (meaning this test can be used) for the duration of the COVID-19 declaration under Section 564(b)(1) of the Act, 21 U.S.C. section 360bbb-3(b)(1), unless the authorization is terminated or revoked sooner.  Performed at [facility], [address] [place], [place] [address]   Culture, blood (Routine x 2)     Status: None (Preliminary result)   Collection Time: 10/23/21  1:07 PM   Specimen: BLOOD  Result Value Ref Range Status   Specimen Description BLOOD BLOOD RIGHT HAND  Final   Special Requests   Final    BOTTLES DRAWN AEROBIC AND ANAEROBIC Blood Culture adequate volume Performed at [facility], [address] [place], [place] [address]    Culture PENDING  Incomplete   Report Status PENDING  Incomplete  MRSA Next Gen by PCR, Nasal     Status: None   Collection Time: 10/23/21  4:05 PM   Specimen: Nasal Mucosa; Nasal Swab  Result Value Ref Range Status   MRSA by PCR Next Gen NOT DETECTED NOT DETECTED Final    Comment: (NOTE) The GeneXpert MRSA Assay (FDA approved for NASAL specimens only), is one component of a comprehensive MRSA colonization  surveillance program. It is not intended to diagnose MRSA infection nor to guide or monitor treatment for MRSA infections. Test performance is not FDA approved in patients less than 66 years old. Performed at [facility], [address] [place], [place] [address]     Radiology Reports CT ABDOMEN PELVIS WO CONTRAST  Result Date: 10/23/2021 CLINICAL DATA:  Nausea and vomiting.  Sepsis. EXAM: CT ABDOMEN AND PELVIS WITHOUT CONTRAST TECHNIQUE: Multidetector CT imaging of the abdomen and pelvis was performed following the standard protocol without IV contrast. RADIATION DOSE REDUCTION: This exam was performed according to the departmental dose-optimization program which includes automated exposure control, adjustment of the mA and/or kV according to patient size and/or use of iterative reconstruction technique. COMPARISON:  08/25/2016. FINDINGS: Lower chest: Scarring and volume loss is identified within the left lower lobe. Moderate pericardial fluid is identified along the left heart border measuring 2.3 cm in thickness. Unchanged from previous exam. Hepatobiliary: No suspicious liver lesions. Stones within the neck of the gallbladder are identified. The largest measures 8 mm. No gallbladder wall thickening or inflammation identified. Pancreas: Unremarkable. No pancreatic ductal dilatation or surrounding inflammatory changes. Spleen: Normal in size without focal abnormality. Adrenals/Urinary Tract: Normal adrenal glands. Bilateral renal cortical volume loss and scarring is noted. Bilateral Bosniak class 1 kidney cysts are noted. The largest arises off the anterior cortex of the interpolar right kidney measuring 1.7 cm. The small bilateral nonobstructing renal calculi are noted. These measure up to 2-3 mm. No hydronephrosis or hydroureter identified. There is gas within the lumen of the urinary bladder with air-fluid level, image 77/2. No bladder wall thickening. Stomach/Bowel: Stomach appears normal. The  appendix is surgically absent. No small bowel wall thickening, inflammation, or distension. Wall thickening with intramural fatty deposition is noted involving the ascending colon. No surrounding inflammatory fat stranding or free fluid. The remaining portions of the colon are unremarkable without signs of inflammation, distension or wall thickening. Vascular/Lymphatic: Severe aortic atherosclerosis. No enlarged abdominal or pelvic lymph nodes. Reproductive: Calcified uterine fibroid is noted measuring 2.4 cm. There is a fluid density cyst within the right adnexa measuring 2.2 cm, image 61/2. Previously this was reported at 2.1 cm. Other: No free fluid or fluid collections. No signs of pneumoperitoneum. Musculoskeletal: No acute or significant osseous findings. IMPRESSION: 1. There is gas within the lumen of the urinary bladder with air-fluid level. Correlate for any clinical signs or symptoms of cystitis. 2. Wall thickening with intramural fatty deposition is noted involving the ascending colon. No surrounding inflammatory fat stranding or free fluid identified. Findings may reflect sequelae of chronic inflammation. 3. Bilateral nonobstructing renal calculi. 4. Gallstones. 5. Moderate pericardial fluid along the left heart border is unchanged from previous exam. 6. Aortic Atherosclerosis (ICD10-I70.0).  Electronically Signed   By: Kerby Moors M.D.   On: 10/23/2021 14:16   DG Chest Port 1 View  Result Date: 10/23/2021 CLINICAL DATA:  Weakness. EXAM: PORTABLE CHEST 1 VIEW COMPARISON:  August 23, 2021. FINDINGS: The heart size and mediastinal contours are within normal limits. Both lungs are clear. The visualized skeletal structures are unremarkable. IMPRESSION: No active disease. Aortic Atherosclerosis (ICD10-I70.0). Electronically Signed   By: Marijo Conception M.D.   On: 10/23/2021 13:13    SIGNED: Deatra James, MD, FHM. [facility],  Pager (please use [email or web address] to page/[email or web address]) Please use Epic  Secure Chat for non-urgent communication (7AM-7PM)  If 7PM-7AM, please contact night-coverage [email or web address], 10/24/2021, 11:41 AM

## 2021-10-25 DIAGNOSIS — A419 Sepsis, unspecified organism: Secondary | ICD-10-CM | POA: Diagnosis not present

## 2021-10-25 DIAGNOSIS — D638 Anemia in other chronic diseases classified elsewhere: Secondary | ICD-10-CM | POA: Diagnosis not present

## 2021-10-25 DIAGNOSIS — N184 Chronic kidney disease, stage 4 (severe): Secondary | ICD-10-CM | POA: Diagnosis not present

## 2021-10-25 DIAGNOSIS — E538 Deficiency of other specified B group vitamins: Secondary | ICD-10-CM | POA: Diagnosis not present

## 2021-10-25 LAB — COMPREHENSIVE METABOLIC PANEL
ALT: 12 U/L (ref 0–44)
AST: 21 U/L (ref 15–41)
Albumin: 3.1 g/dL — ABNORMAL LOW (ref 3.5–5.0)
Alkaline Phosphatase: 26 U/L — ABNORMAL LOW (ref 38–126)
Anion gap: 8 (ref 5–15)
BUN: 33 mg/dL — ABNORMAL HIGH (ref 8–23)
CO2: 18 mmol/L — ABNORMAL LOW (ref 22–32)
Calcium: 9.6 mg/dL (ref 8.9–10.3)
Chloride: 110 mmol/L (ref 98–111)
Creatinine, Ser: 1.98 mg/dL — ABNORMAL HIGH (ref 0.44–1.00)
GFR, Estimated: 24 mL/min — ABNORMAL LOW (ref >60)
Glucose, Bld: 121 mg/dL — ABNORMAL HIGH (ref 70–99)
Potassium: 5 mmol/L (ref 3.5–5.1)
Sodium: 136 mmol/L (ref 135–145)
Total Bilirubin: 0.6 mg/dL (ref 0.3–1.2)
Total Protein: 6.4 g/dL — ABNORMAL LOW (ref 6.5–8.1)

## 2021-10-25 LAB — CBC
HCT: 27 % — ABNORMAL LOW (ref 36.0–46.0)
Hemoglobin: 8.4 g/dL — ABNORMAL LOW (ref 12.0–15.0)
MCH: 31.1 pg (ref 26.0–34.0)
MCHC: 31.1 g/dL (ref 30.0–36.0)
MCV: 100 fL (ref 80.0–100.0)
Platelets: 230 10*3/uL (ref 150–400)
RBC: 2.7 MIL/uL — ABNORMAL LOW (ref 3.87–5.11)
RDW: 14.4 % (ref 11.5–15.5)
WBC: 5.1 10*3/uL (ref 4.0–10.5)
nRBC: 0 % (ref 0.0–0.2)

## 2021-10-25 LAB — URINE CULTURE: Culture: >=100000 — AB

## 2021-10-25 LAB — GLUCOSE, CAPILLARY
Glucose-Capillary: 117 mg/dL — ABNORMAL HIGH (ref 70–99)
Glucose-Capillary: 118 mg/dL — ABNORMAL HIGH (ref 70–99)
Glucose-Capillary: 123 mg/dL — ABNORMAL HIGH (ref 70–99)
Glucose-Capillary: 128 mg/dL — ABNORMAL HIGH (ref 70–99)
Glucose-Capillary: 106 mg/dL — ABNORMAL HIGH (ref 70–99)

## 2021-10-25 LAB — TSH: TSH: 1.39 u[IU]/mL (ref 0.350–4.500)

## 2021-10-25 NOTE — Progress Notes (Signed)
PROGRESS NOTE    Patient: Jocelyn Mendez                            PCP: Hoyt Koch, MD                    DOB: [birth year]            DOA: 10/23/2021 [id number]             DOS: 10/25/2021, 11:15 AM   LOS: 2 days   Date of Service: The patient was seen and examined on 10/25/2021  Subjective:   The patient was seen and examined this morning, mild lethargic, antihypertensive otherwise easily arousable, following command   Brief Narrative:   Jocelyn Mendez is a 88 year old female presenting from home with altered mental status.  Medical history of DM 2 (last A1c 6.2 in July 23), HTN, HLD, CKD stage 4, GERD, hypothyroidism, depression, iron deficiency anemia, and asthma.   Patient is accompanied by 2 sons who reported patient "got sick " very quickly overnight, developed fever, more confused now.  It looks like she was going to pass out.  She saw her endocrinologist yesterday who started her on potassium low-dose and advised to go to PCP.  Patient had some nausea and abdominal pain but no vomiting denied have diarrhea constipation  she has been complaining of dysuria and urinary incontinency..  Her urine has been smelling foul odor.     ED course: Blood pressure (!) 123/59, pulse 93, temperature 99.5 F (37.5 C), temperature source Oral, resp. rate 19, height '5\' 6"'  (1.676 m), weight 65.3 kg, SpO2 98 %.     Latest Ref Rng & Units 10/23/2021   12:50 PM 10/22/2021    1:32 PM 09/04/2021    2:25 PM  CMP  Glucose 70 - 99 mg/dL 150  75  105   BUN 8 - 23 mg/dL 36  38    Creatinine 0.44 - 1.00 mg/dL 2.28  2.32    Sodium 135 - 145 mmol/L 134  135    Potassium 3.5 - 5.1 mmol/L 6.2  6.1 No hemolysis seen    Chloride 98 - 111 mmol/L 110  108    CO2 22 - 32 mmol/L 16  21    Calcium 8.9 - 10.3 mg/dL 9.5  9.6    Total Protein 6.5 - 8.1 g/dL 7.6  7.0    Total Bilirubin 0.3 - 1.2 mg/dL 1.4  0.6    Alkaline Phos 38 - 126 U/L 36  35    AST 15 - 41 U/L 23  23    ALT 0 - 44 U/L 15   12      UA positive for large leukocyte esterase, many bacteria, > 50 WBCs  -On arrival: met sepsis criteria: Tmax 102.1, RR 24, PR 102, BP 104/40, was satting 86% on arrival, improved to 97% With endorgan compromise a on CKD, creatinine 2.32, potassium 6.2  Patient was given a liter of IV normal saline, IV Rocephin, calcium gluconate 1 g Blood and urine cultures were obtained  Requested patient to be admitted for urosepsis     Assessment & Plan:   Principal Problem:   Sepsis ([place]) Active Problems:   Toxic metabolic encephalopathy   Hyperkalemia   Anemia, chronic disease   Hypothyroidism   B12 deficiency   Essential hypertension   GERD   CKD (chronic kidney disease) stage 4, GFR  15-29 ml/min (HCC)   Diabetes (HCC)   Fever   Iron deficiency   HLD (hyperlipidemia)     Assessment and Plan: * Sepsis ([place]) - Sepsis physiology resolved -Afebrile and normotensive now.   -On admission: met sepsis criteria: Tmax 102.1, RR 24, PR 102, BP 104/40, was satting 86% on arrival, improved to 97% With endorgan compromise a on CKD, creatinine 2.32, potassium 6.2   -Likely likely UTI, urosepsis -previous culture consistent with Klebsiella.  Sensitive to broad-spectrum antibiotic exception of ampicillin -Received a dose of Rocephin, will be switched to cefepime -Continue IV antibiotic of cefepime -Urine culture>> > 100 K colonies gram-negative rods -Blood cultures>> no growth to date  -Sepsis protocol initiated: IV fluid switched from LR to NS due to hyperkalemia -Patient will be transferred out of ICU   Hyperkalemia - Serum potassium 6.1, 6.2 >>>> 5.9 >> 5.0 this a.m.  -On arrival 6.2, peaked T waves, treated with Kayexalate, calcium gluconate, and Lasix  -Monitoring potassium level closely -Considering Lokelma  -We will monitor on telemetry bed closely   Toxic metabolic encephalopathy - Likely due to sepsis -Mentation is much improved  -Monitoring closely, continue  treating underlying causes of sepsis, UTI -Avoiding hallucinogenic's, and narcotics   Anemia, chronic disease - Baseline hemoglobin around 11 -Hemoglobin dropped to 8.4, likely delusional, No signs of bleeding   HLD (hyperlipidemia) - resume Crestor   Iron deficiency - We will continue iron supplements  -Monitoring H&H  Fever - Resolved - Due to sepsis UTI-continue with the as needed Tylenol   Diabetes (HCC) - We will holding home regimen -Resuming and checking CBG q. ACHS with SSI coverage   CKD (chronic kidney disease) stage 4, GFR 15-29 ml/min (HCC) - Baseline creatinine 1.6-1.8  -Baseline BUN 26- 34, 36, 33 -GFR baseline around 30 --- 18.28  -Acute on CKD stage 4- Creatinine 2.32 >> 2.28 >> 1.98  GERD - Continue PPI  Essential hypertension -Hypotensive-resolved, blood pressure stabilizing  - Due to sepsis, ruling out severe sepsis and hypotension -Still holding home BP medication of Norvasc for now -We will monitor BP closely -As needed hydralazine will be utilized  B12 deficiency -  continuing B12 supplements  Hypothyroidism - Continue p.o. Synthroid once tolerating p.o. -3.59 months ago            ----------------------------------------------------------------------------------------------------------------------------------------------- Nutritional status:  The patient's BMI is: Body mass index is 22.49 kg/m. I agree with the assessment and plan as outlined --------------------------------------------------------------------------------------------------------------------------------------------- Cultures; Blood Cultures x 2 >> NGT Urine Culture  >>> NGT  --------------------------------------------------------------------------------------------------------------------------------------------  DVT prophylaxis:  heparin injection 5,000 Units Start: 10/23/21 1500 TED hose Start: 10/23/21 1450 SCDs Start: 10/23/21 1450   Code Status:    Code Status: Full Code  Family Communication: No family member present at bedside- attempt will be made to update daily The above findings and plan of care has been discussed with patient (and family)  in detail,  they expressed understanding and agreement of above. -Advance care planning has been discussed.   Admission status:   Status is: Inpatient Remains inpatient appropriate because: Required treatment for urosepsis, IV antibiotics, IV fluids,     Procedures:   No admission procedures for hospital encounter.   Antimicrobials:  Anti-infectives (From admission, onward)    Start     Dose/Rate Route Frequency Ordered Stop   10/23/21 1500  ceFEPIme (MAXIPIME) 2 g in sodium chloride 0.9 % 100 mL IVPB  Status:  Discontinued        2 g 200  mL/hr over 30 Minutes Intravenous  Once 10/23/21 1453 10/23/21 1456   10/23/21 1500  ceFEPIme (MAXIPIME) 2 g in sodium chloride 0.9 % 100 mL IVPB        2 g 200 mL/hr over 30 Minutes Intravenous Every 24 hours 10/23/21 1456     10/23/21 1445  cefTRIAXone (ROCEPHIN) 1 g in sodium chloride 0.9 % 100 mL IVPB        1 g 200 mL/hr over 30 Minutes Intravenous  Once 10/23/21 1432 10/23/21 1530        Medication:   acidophilus  1 capsule Oral TID WC   aspirin EC  81 mg Oral Daily   Chlorhexidine Gluconate Cloth  6 each Topical Daily   gabapentin  100 mg Oral TID   heparin  5,000 Units Subcutaneous Q8H   insulin aspart  0-9 Units Subcutaneous TID WC   iron polysaccharides  150 mg Oral Daily   latanoprost  1 drop Both Eyes QHS   levothyroxine  137 mcg Oral QAC breakfast   sodium chloride flush  3 mL Intravenous Q12H    acetaminophen **OR** acetaminophen, bisacodyl, hydrALAZINE, HYDROmorphone (DILAUDID) injection, ipratropium, levalbuterol, ondansetron **OR** ondansetron (ZOFRAN) IV, mouth rinse, oxyCODONE, senna-docusate, sodium phosphate, traZODone   Objective:   Vitals:   10/25/21 0400 10/25/21 0454 10/25/21 0500 10/25/21 0815  BP:  (!) 161/127  (!) 149/39   Pulse: (!) 101  91   Resp: 20  (!) 24   Temp: 98.9 F (37.2 C)   98.9 F (37.2 C)  TempSrc: Oral   Oral  SpO2: 96%  99%   Weight:  63.2 kg    Height:        Intake/Output Summary (Last 24 hours) at 10/25/2021 1115 Last data filed at 10/25/2021 0948 Gross per 24 hour  Intake 6 ml  Output 1275 ml  Net -1269 ml   Filed Weights   10/23/21 1545 10/24/21 0500 10/25/21 0454  Weight: 63.5 kg 62.7 kg 63.2 kg     Examination:      Physical Exam:   General:  AAO x 3,  cooperative, no distress;   HEENT:  Normocephalic, PERRL, otherwise with in Normal limits   Neuro:  CNII-XII intact. , normal motor and sensation, reflexes intact   Lungs:   Clear to auscultation BL, Respirations unlabored,  No wheezes / crackles  Cardio:    S1/S2, RRR, No murmure, No Rubs or Gallops   Abdomen:  Soft, non-tender, bowel sounds active all four quadrants, no guarding or peritoneal signs.  Muscular  skeletal:  Limited exam -global generalized weaknesses - in bed, able to move all 4 extremities,   2+ pulses,  symmetric, No pitting edema  Skin:  Dry, warm to touch, negative for any Rashes,  Wounds: Please see nursing documentation         ------------------------------------------------------------------------------------------------------------------------------------------    LABs:     Latest Ref Rng & Units 10/25/2021    5:08 AM 10/24/2021    3:30 AM 10/23/2021   12:50 PM  CBC  WBC 4.0 - 10.5 K/uL 5.1  5.7  10.1   Hemoglobin 12.0 - 15.0 g/dL 8.4  9.1  10.2   Hematocrit 36.0 - 46.0 % 27.0  29.4  32.7   Platelets 150 - 400 K/uL 230  237  244       Latest Ref Rng & Units 10/25/2021    5:08 AM 10/24/2021   12:08 PM 10/24/2021    3:30 AM  CMP  Glucose 70 -  99 mg/dL 121  125  191   BUN 8 - 23 mg/dL 33  27  32   Creatinine 0.44 - 1.00 mg/dL 1.98  1.71  2.00   Sodium 135 - 145 mmol/L 136  138  134   Potassium 3.5 - 5.1 mmol/L 5.0  4.3  5.9   Chloride 98 - 111  mmol/L 110  118  111   CO2 22 - 32 mmol/L '18  16  17   ' Calcium 8.9 - 10.3 mg/dL 9.6  8.0  9.6   Total Protein 6.5 - 8.1 g/dL 6.4   6.9   Total Bilirubin 0.3 - 1.2 mg/dL 0.6   0.8   Alkaline Phos 38 - 126 U/L 26   30   AST 15 - 41 U/L 21   20   ALT 0 - 44 U/L 12   13        Micro Results Recent Results (from the past 240 hour(s))  Urine Culture     Status: Abnormal (Preliminary result)   Collection Time: 10/23/21 12:49 PM   Specimen: Urine, Clean Catch  Result Value Ref Range Status   Specimen Description   Final    URINE, CLEAN CATCH Performed at [facility], [address] [place], [place] [address]    Special Requests   Final    NONE Performed at [facility], [address], [place], [place] [address]    Culture (A)  Final    >=100,000 COLONIES/mL GRAM NEGATIVE RODS IDENTIFICATION AND SUSCEPTIBILITIES TO FOLLOW Performed at [facility], [place] [address] [place], [place] [address]    Report Status PENDING  Incomplete  Culture, blood (Routine x 2)     Status: None (Preliminary result)   Collection Time: 10/23/21 12:51 PM   Specimen: BLOOD  Result Value Ref Range Status   Specimen Description BLOOD LEFT ANTECUBITAL  Final   Special Requests   Final    BOTTLES DRAWN AEROBIC AND ANAEROBIC Blood Culture results may not be optimal due to an inadequate volume of blood received in culture bottles Performed at [facility], [address], [place],  [address]    Culture PENDING  Incomplete   Report Status PENDING  Incomplete  SARS Coronavirus 2 by RT PCR (hospital order, performed in [facility] hospital lab) *cepheid single result test* Anterior Nasal Swab     Status: None   Collection Time: 10/23/21  1:01 PM   Specimen: Anterior Nasal Swab  Result Value Ref Range Status   SARS Coronavirus 2 by RT PCR NEGATIVE NEGATIVE Final    Comment: (NOTE) SARS-CoV-2 target nucleic acids are NOT DETECTED.  The SARS-CoV-2 RNA is generally detectable in upper and  lower respiratory specimens during the acute phase of infection. The lowest concentration of SARS-CoV-2 viral copies this assay can detect is 250 copies / mL. A negative result does not preclude SARS-CoV-2 infection and should not be used as the sole basis for treatment or other patient management decisions.  A negative result may occur with improper specimen collection / handling, submission of specimen other than nasopharyngeal swab, presence of viral mutation(s) within the areas targeted by this assay, and inadequate number of viral copies (<250 copies / mL). A negative result must be combined with clinical observations, patient history, and epidemiological information.  Fact Sheet for Patients:   https://www.patel.info/  Fact Sheet for Healthcare Providers: https://hall.com/  This test is not yet approved or  cleared by the [place] FDA and has been  authorized for detection and/or diagnosis of SARS-CoV-2 by FDA under an Emergency Use Authorization (EUA).  This EUA will remain in effect (meaning this test can be used) for the duration of the COVID-19 declaration under Section 564(b)(1) of the Act, 21 U.S.C. section 360bbb-3(b)(1), unless the authorization is terminated or revoked sooner.  Performed at [facility], [address], [place], [place] [address]   Culture, blood (Routine x 2)     Status: None (Preliminary result)   Collection Time: 10/23/21  1:07 PM   Specimen: BLOOD  Result Value Ref Range Status   Specimen Description BLOOD BLOOD RIGHT HAND  Final   Special Requests   Final    BOTTLES DRAWN AEROBIC AND ANAEROBIC Blood Culture adequate volume Performed at [facility], [address], [place], [place] [address]    Culture PENDING  Incomplete   Report Status PENDING  Incomplete  MRSA Next Gen by PCR, Nasal     Status: None   Collection Time: 10/23/21  4:05 PM   Specimen: Nasal Mucosa; Nasal Swab  Result Value Ref  Range Status   MRSA by PCR Next Gen NOT DETECTED NOT DETECTED Final    Comment: (NOTE) The GeneXpert MRSA Assay (FDA approved for NASAL specimens only), is one component of a comprehensive MRSA colonization surveillance program. It is not intended to diagnose MRSA infection nor to guide or monitor treatment for MRSA infections. Test performance is not FDA approved in patients less than 84 years old. Performed at [facility], [address], [place],  [address]     Radiology Reports No results found.  SIGNED: Deatra James, MD, FHM. [facility],  Pager (please use [email or web address] to page/text) Please use Epic Secure Chat for non-urgent communication (7AM-7PM)  If 7PM-7AM, please contact night-coverage [email or web address], 10/25/2021, 11:15 AM

## 2021-10-25 NOTE — Plan of Care (Signed)
  Problem: Acute Rehab PT Goals(only PT should resolve) Goal: Pt Will Go Supine/Side To Sit Outcome: Progressing Flowsheets (Taken 10/25/2021 1602) Pt will go Supine/Side to Sit:  with min guard assist  with minimal assist Goal: Patient Will Transfer Sit To/From Stand Outcome: Progressing Flowsheets (Taken 10/25/2021 1602) Patient will transfer sit to/from stand:  with min guard assist  with minimal assist Goal: Pt Will Transfer Bed To Chair/Chair To Bed Outcome: Progressing Flowsheets (Taken 10/25/2021 1602) Pt will Transfer Bed to Chair/Chair to Bed:  with min assist  min guard assist Goal: Pt Will Ambulate Outcome: Progressing Flowsheets (Taken 10/25/2021 1602) Pt will Ambulate:  25 feet  with minimal assist  with rolling walker   4:03 PM, 10/25/21 Lonell Grandchild, MPT Physical Therapist with [facility] [phone] office [phone] mobile phone

## 2021-10-25 NOTE — TOC Initial Note (Signed)
Transition of Care [facility]) - Initial/Assessment Note    Patient Details  Name: Jocelyn Mendez MRN: [id number] Date of Birth: October 29, 1930  Transition of Care [facility]) CM/SW Contact:    Iona Beard, [place] Phone Number: 10/25/2021, 3:41 PM  Clinical Narrative:                 Pt is high risk for readmission. CSW spoke with pts daughter to complete assessment. Pts son lives with her and is able to assist in the home when needed. Pts daughter lives close by also. CSW explained that PT is recommending SNF at D/C. Pts daughter states they do not want SNF at D/C. CSW inquired about interest in [facility]. At this time she is not interested in [place] setting this up. CSW explained if the hospital does not set this up then family will need to work with pts PCP for [facility] set up. Pts daughter states she is understanding of this. TOC to follow.   Expected Discharge Plan: Home/Self Care Barriers to Discharge: Continued Medical Work up   Patient Goals and CMS Choice Patient states their goals for this hospitalization and ongoing recovery are:: return home CMS Medicare.gov Compare Post Acute Care list provided to:: Patient Represenative (must comment) Choice offered to / list presented to : Adult Children  Expected Discharge Plan and Services Expected Discharge Plan: Home/Self Care In-house Referral: Clinical Social Work Discharge Planning Services: CM Consult   Living arrangements for the past 2 months: Single Family Home                                      Prior Living Arrangements/Services Living arrangements for the past 2 months: Single Family Home Lives with:: Adult Children   Do you feel safe going back to the place where you live?: Yes      Need for Family Participation in Patient Care: Yes (Comment) Care giver support system in place?: Yes (comment) Current home services: DME Criminal Activity/Legal Involvement Pertinent to Current Situation/Hospitalization: No - Comment as  needed  Activities of Daily Living Home Assistive Devices/Equipment: CBG Meter, Dentures (specify type), Eyeglasses, Walker (specify type) ADL Screening (condition at time of admission) Patient's cognitive ability adequate to safely complete daily activities?: No Is the patient deaf or have difficulty hearing?: Yes Does the patient have difficulty seeing, even when wearing glasses/contacts?: No Does the patient have difficulty concentrating, remembering, or making decisions?: Yes Patient able to express need for assistance with ADLs?: No Does the patient have difficulty dressing or bathing?: Yes Independently performs ADLs?: No Communication: Independent Dressing (OT): Independent Grooming: Independent Feeding: Independent Bathing: Needs assistance Is this a change from baseline?: Pre-admission baseline Toileting: Needs assistance Is this a change from baseline?: Pre-admission baseline In/Out Bed: Independent with device (comment) Walks in Home: Independent with device (comment) Does the patient have difficulty walking or climbing stairs?: Yes Weakness of Legs: None Weakness of Arms/Hands: None  Permission Sought/Granted                  Emotional Assessment       Orientation: : Oriented to Self Alcohol / Substance Use: Not Applicable Psych Involvement: No (comment)  Admission diagnosis:  Sepsis [facility]) [A41.9] Patient Active Problem List   Diagnosis Date Noted   Sepsis ([place]) 10/23/2021   HLD (hyperlipidemia) 10/23/2021   Hyperkalemia 10/23/2021    Class: Acute   Weakness 08/23/2021   Pain  due to onychomycosis of toenails of both feet 02/26/2021   Iron deficiency 12/05/2020   Elevated AST (SGOT) 11/20/2020   Urge incontinence 11/20/2020   Routine general medical examination at a health care facility 03/05/2019   Left breast lump 12/08/2017   Allergic rhinitis 12/08/2017   Fever 08/27/2016   Asthma 59/27/6394   Toxic metabolic encephalopathy 32/00/3794    Diabetes ([place]) 03/28/2015   Gait abnormality 05/13/2010   B12 deficiency 11/23/2007   Anemia, chronic disease 01/21/2007   CKD (chronic kidney disease) stage 4, GFR 15-29 ml/min ([place]) 01/21/2007   Hypothyroidism 11/21/2006   Essential hypertension 11/21/2006   GERD 11/21/2006   DEGENERATIVE JOINT DISEASE, GENERALIZED 11/21/2006   Chronic bilateral low back pain without sciatica 11/21/2006   Osteoporosis 11/21/2006   PCP:  Hoyt Koch, MD Pharmacy:   [facility] [place] [address] [place] [address] Phone: [phone] Fax: [phone]     Social Determinants of Health (SDOH) Interventions Housing Interventions: Intervention Not Indicated  Readmission Risk Interventions    10/25/2021    3:40 PM  Readmission Risk Prevention Plan  Transportation Screening Complete  HRI or [place] Complete  Social Work Consult for [place] Planning/Counseling Complete  Palliative Care Screening Not Applicable  Medication Review Press photographer) Complete

## 2021-10-25 NOTE — Evaluation (Signed)
Physical Therapy Evaluation Patient Details Name: TOINI FAILLA MRN: [id number] DOB: 1930/04/09 Today's Date: 10/25/2021  History of Present Illness  MAKHYA ARAVE is a 88 year old female presenting from home with altered mental status.  Medical history of DM 2 (last A1c 6.2 in July 23), HTN, HLD, CKD stage 4, GERD, hypothyroidism, depression, iron deficiency anemia, and asthma.        Patient is accompanied by 2 sons who reported patient "got sick " very quickly overnight, developed fever, more confused now.  It looks like she was going to pass out.  She saw her endocrinologist yesterday who started her on potassium low-dose and advised to go to PCP.  Patient had some nausea and abdominal pain but no vomiting denied have diarrhea constipation   she has been complaining of dysuria and urinary incontinency..  Her urine has been smelling foul odor.   Clinical Impression  Patient demonstrates labored movement for bed mobility, sit to stands and transfers requiring frequent rest breaks due to fatigue, very unsteady on feet during ambulation with RW, once fatigued has decreased balance requiring Mod assist to make it back to chair.  Patient tolerated sitting up in chair after therapy - RN aware.  Patient will benefit from continued skilled physical therapy in hospital and recommended venue below to increase strength, balance, endurance for safe ADLs and gait.         Recommendations for follow up therapy are one component of a multi-disciplinary discharge planning process, led by the attending physician.  Recommendations may be updated based on patient status, additional functional criteria and insurance authorization.  Follow Up Recommendations Skilled nursing-short term rehab (<3 hours/day)      Assistance Recommended at Discharge Intermittent Supervision/Assistance  Patient can return home with the following  A lot of help with bathing/dressing/bathroom;A little help with  bathing/dressing/bathroom;Assistance with cooking/housework;Help with stairs or ramp for entrance    Equipment Recommendations None recommended by PT  Recommendations for Other Services       Functional Status Assessment Patient has had a recent decline in their functional status and demonstrates the ability to make significant improvements in function in a reasonable and predictable amount of time.     Precautions / Restrictions Precautions Precautions: Fall Restrictions Weight Bearing Restrictions: No      Mobility  Bed Mobility Overal bed mobility: Needs Assistance Bed Mobility: Supine to Sit     Supine to sit: Min assist     General bed mobility comments: increased time, labored movement    Transfers Overall transfer level: Needs assistance Equipment used: Rolling walker (2 wheels) Transfers: Sit to/from Stand, Bed to chair/wheelchair/BSC Sit to Stand: Min assist   Step pivot transfers: Min assist       General transfer comment: slow labored movement    Ambulation/Gait Ambulation/Gait assistance: Min assist, Mod assist Gait Distance (Feet): 18 Feet Assistive device: Rolling walker (2 wheels) Gait Pattern/deviations: Decreased step length - right, Decreased step length - left, Decreased stride length, Staggering left, Staggering right Gait velocity: decreased     General Gait Details: slow labored cadence with frequent staggering right/left, becomes very unsteady once fatigued with near loss of balance  Stairs            Wheelchair Mobility    Modified Rankin (Stroke Patients Only)       Balance Overall balance assessment: Needs assistance Sitting-balance support: Feet supported, No upper extremity supported Sitting balance-Leahy Scale: Fair Sitting balance - Comments: fair/good seated at EOB  Standing balance support: During functional activity, Bilateral upper extremity supported Standing balance-Leahy Scale: Poor Standing balance  comment: fair/poor using RW                             Pertinent Vitals/Pain Pain Assessment Pain Assessment: No/denies pain    Home Living Family/patient expects to be discharged to:: Private residence Living Arrangements: Children Available Help at Discharge: Family Type of Home: House Home Access: Stairs to enter Entrance Stairs-Rails: Right Entrance Stairs-Number of Steps: 1   Home Layout: One level Home Equipment: Conservation officer, nature (2 wheels);Shower seat;Grab bars - tub/shower      Prior Function Prior Level of Function : Needs assist       Physical Assist : Mobility (physical);ADLs (physical) Mobility (physical): Bed mobility;Transfers;Gait;Stairs   Mobility Comments: household ambulator using RW ADLs Comments: Pt reports assist for bathing and indepndence for other ADL's. Family likely assisting with IADL's.     Hand Dominance   Dominant Hand: Right    Extremity/Trunk Assessment   Upper Extremity Assessment Upper Extremity Assessment: Defer to OT evaluation    Lower Extremity Assessment Lower Extremity Assessment: Generalized weakness    Cervical / Trunk Assessment Cervical / Trunk Assessment: Kyphotic  Communication   Communication: No difficulties  Cognition Arousal/Alertness: Awake/alert Behavior During Therapy: WFL for tasks assessed/performed Overall Cognitive Status: Within Functional Limits for tasks assessed                                          General Comments      Exercises     Assessment/Plan    PT Assessment Patient needs continued PT services  PT Problem List Decreased strength;Decreased activity tolerance;Decreased balance;Decreased mobility       PT Treatment Interventions DME instruction;Gait training;Stair training;Functional mobility training;Therapeutic activities;Therapeutic exercise;Patient/family education;Balance training    PT Goals (Current goals can be found in the Care Plan section)   Acute Rehab PT Goals Patient Stated Goal: return home PT Goal Formulation: With patient Time For Goal Achievement: 11/08/21 Potential to Achieve Goals: Good    Frequency Min 3X/week     Co-evaluation               AM-PAC PT "6 Clicks" Mobility  Outcome Measure Help needed turning from your back to your side while in a flat bed without using bedrails?: A Little Help needed moving from lying on your back to sitting on the side of a flat bed without using bedrails?: A Little Help needed moving to and from a bed to a chair (including a wheelchair)?: A Little Help needed standing up from a chair using your arms (e.g., wheelchair or bedside chair)?: A Lot Help needed to walk in hospital room?: A Lot Help needed climbing 3-5 steps with a railing? : A Lot 6 Click Score: 15    End of Session   Activity Tolerance: Patient tolerated treatment well;Patient limited by fatigue Patient left: in chair;with call bell/phone within reach Nurse Communication: Mobility status PT Visit Diagnosis: Unsteadiness on feet (R26.81);Other abnormalities of gait and mobility (R26.89);Muscle weakness (generalized) (M62.81)    Time: 1449-1510 PT Time Calculation (min) (ACUTE ONLY): 21 min   Charges:   PT Evaluation $PT Eval Moderate Complexity: 1 Mod PT Treatments $Therapeutic Activity: 8-22 mins        4:01 PM, 10/25/21 Lonell Grandchild, MPT Physical  Therapist with [facility] [phone] office [phone] mobile phone

## 2021-10-26 ENCOUNTER — Inpatient Hospital Stay (HOSPITAL_COMMUNITY): Payer: Medicare Other

## 2021-10-26 DIAGNOSIS — N184 Chronic kidney disease, stage 4 (severe): Secondary | ICD-10-CM | POA: Diagnosis not present

## 2021-10-26 DIAGNOSIS — A419 Sepsis, unspecified organism: Secondary | ICD-10-CM | POA: Diagnosis not present

## 2021-10-26 DIAGNOSIS — D638 Anemia in other chronic diseases classified elsewhere: Secondary | ICD-10-CM | POA: Diagnosis not present

## 2021-10-26 DIAGNOSIS — E538 Deficiency of other specified B group vitamins: Secondary | ICD-10-CM | POA: Diagnosis not present

## 2021-10-26 LAB — CBC
HCT: 28.2 % — ABNORMAL LOW (ref 36.0–46.0)
Hemoglobin: 8.8 g/dL — ABNORMAL LOW (ref 12.0–15.0)
MCH: 31.1 pg (ref 26.0–34.0)
MCHC: 31.2 g/dL (ref 30.0–36.0)
MCV: 99.6 fL (ref 80.0–100.0)
Platelets: 244 10*3/uL (ref 150–400)
RBC: 2.83 MIL/uL — ABNORMAL LOW (ref 3.87–5.11)
RDW: 14.5 % (ref 11.5–15.5)
WBC: 5.3 10*3/uL (ref 4.0–10.5)
nRBC: 0 % (ref 0.0–0.2)

## 2021-10-26 LAB — GLUCOSE, CAPILLARY
Glucose-Capillary: 104 mg/dL — ABNORMAL HIGH (ref 70–99)
Glucose-Capillary: 104 mg/dL — ABNORMAL HIGH (ref 70–99)
Glucose-Capillary: 115 mg/dL — ABNORMAL HIGH (ref 70–99)
Glucose-Capillary: 118 mg/dL — ABNORMAL HIGH (ref 70–99)
Glucose-Capillary: 125 mg/dL — ABNORMAL HIGH (ref 70–99)
Glucose-Capillary: 127 mg/dL — ABNORMAL HIGH (ref 70–99)

## 2021-10-26 LAB — COMPREHENSIVE METABOLIC PANEL
ALT: 14 U/L (ref 0–44)
AST: 26 U/L (ref 15–41)
Albumin: 3.2 g/dL — ABNORMAL LOW (ref 3.5–5.0)
Alkaline Phosphatase: 27 U/L — ABNORMAL LOW (ref 38–126)
Anion gap: 10 (ref 5–15)
BUN: 39 mg/dL — ABNORMAL HIGH (ref 8–23)
CO2: 16 mmol/L — ABNORMAL LOW (ref 22–32)
Calcium: 9.6 mg/dL (ref 8.9–10.3)
Chloride: 107 mmol/L (ref 98–111)
Creatinine, Ser: 1.89 mg/dL — ABNORMAL HIGH (ref 0.44–1.00)
GFR, Estimated: 25 mL/min — ABNORMAL LOW (ref >60)
Glucose, Bld: 121 mg/dL — ABNORMAL HIGH (ref 70–99)
Potassium: 5.1 mmol/L (ref 3.5–5.1)
Sodium: 133 mmol/L — ABNORMAL LOW (ref 135–145)
Total Bilirubin: 0.8 mg/dL (ref 0.3–1.2)
Total Protein: 6.9 g/dL (ref 6.5–8.1)

## 2021-10-26 MED ORDER — CIPROFLOXACIN HCL 250 MG PO TABS
500.0000 mg | ORAL_TABLET | Freq: Two times a day (BID) | ORAL | Status: DC
  Filled 2021-10-26: qty 2

## 2021-10-26 MED ORDER — POLYETHYLENE GLYCOL 3350 17 G PO PACK
17.0000 g | PACK | Freq: Every day | ORAL | Status: DC
  Administered 2021-10-26 – 2021-10-27 (×2): 17 g via ORAL
  Filled 2021-10-26: qty 1

## 2021-10-26 MED ORDER — CIPROFLOXACIN HCL 500 MG PO TABS: 500.0000 mg | ORAL_TABLET | Freq: Every day | ORAL | 0 refills | Status: AC

## 2021-10-26 MED ORDER — CIPROFLOXACIN HCL 250 MG PO TABS
500.0000 mg | ORAL_TABLET | Freq: Every day | ORAL | Status: DC
  Administered 2021-10-26 – 2021-10-27 (×2): 500 mg via ORAL
  Filled 2021-10-26: qty 2

## 2021-10-26 MED ORDER — SODIUM CHLORIDE 0.9 % IV SOLN: INTRAVENOUS | Status: AC

## 2021-10-26 MED ORDER — RISAQUAD PO CAPS: 1.0000 | ORAL_CAPSULE | Freq: Three times a day (TID) | ORAL | 0 refills | Status: AC

## 2021-10-26 MED ORDER — SODIUM BICARBONATE 650 MG PO TABS
650.0000 mg | ORAL_TABLET | Freq: Two times a day (BID) | ORAL | Status: DC
  Administered 2021-10-26 – 2021-10-27 (×3): 650 mg via ORAL
  Filled 2021-10-26 (×3): qty 1

## 2021-10-26 NOTE — Progress Notes (Signed)
During morning assessment pt was noted to be lethargic. Pt would open eyes and respond when spoken to. Pt was offered breakfast, but declined. Pt was able to take morning medications with no c/o pain at this time. Will continue to monitor pt.

## 2021-10-26 NOTE — Discharge Summary (Signed)
Physician Discharge Summary   Patient: Jocelyn Mendez MRN: [id number] DOB: 1930-06-08  Admit date:     10/23/2021  Discharge date: 10/26/21  Discharge Physician: Deatra James   PCP: Hoyt Koch, MD   Recommendations at discharge:  Follow-up with PCP in 1 week Repeat BMP within 1 week with follow-up with PCP, to monitor potassium level Avoid any over-the-counter or high potassium diet or supplements  Discharge Diagnoses: Principal Problem:   Sepsis ([place]) Active Problems:   Toxic metabolic encephalopathy   Hyperkalemia   Anemia, chronic disease   Hypothyroidism   B12 deficiency   Essential hypertension   GERD   CKD (chronic kidney disease) stage 4, GFR 15-29 ml/min (HCC)   Diabetes (HCC)   Fever   Iron deficiency   HLD (hyperlipidemia)  Resolved Problems:   * No resolved hospital problems. *  Hospital Course: Jocelyn Mendez is a 88 year old female presenting from home with altered mental status.  Medical history of DM 2 (last A1c 6.2 in July 23), HTN, HLD, CKD stage 4, GERD, hypothyroidism, depression, iron deficiency anemia, and asthma.   Patient is accompanied by 2 sons who reported patient "got sick " very quickly overnight, developed fever, more confused now.  It looks like she was going to pass out.  She saw her endocrinologist yesterday who started her on potassium low-dose and advised to go to PCP.  Patient had some nausea and abdominal pain but no vomiting denied have diarrhea constipation  she has been complaining of dysuria and urinary incontinency..  Her urine has been smelling foul odor.     ED course: Blood pressure (!) 123/59, pulse 93, temperature 99.5 F (37.5 C), temperature source Oral, resp. rate 19, height '5\' 6"'  (1.676 m), weight 65.3 kg, SpO2 98 %.     Latest Ref Rng & Units 10/23/2021   12:50 PM 10/22/2021    1:32 PM 09/04/2021    2:25 PM  CMP  Glucose 70 - 99 mg/dL 150  75  105   BUN 8 - 23 mg/dL 36  38    Creatinine 0.44 -  1.00 mg/dL 2.28  2.32    Sodium 135 - 145 mmol/L 134  135    Potassium 3.5 - 5.1 mmol/L 6.2  6.1 No hemolysis seen    Chloride 98 - 111 mmol/L 110  108    CO2 22 - 32 mmol/L 16  21    Calcium 8.9 - 10.3 mg/dL 9.5  9.6    Total Protein 6.5 - 8.1 g/dL 7.6  7.0    Total Bilirubin 0.3 - 1.2 mg/dL 1.4  0.6    Alkaline Phos 38 - 126 U/L 36  35    AST 15 - 41 U/L 23  23    ALT 0 - 44 U/L 15  12      UA positive for large leukocyte esterase, many bacteria, > 50 WBCs  -On arrival: met sepsis criteria: Tmax 102.1, RR 24, PR 102, BP 104/40, was satting 86% on arrival, improved to 97% With endorgan compromise a on CKD, creatinine 2.32, potassium 6.2  Patient was given a liter of IV normal saline, IV Rocephin, calcium gluconate 1 g Blood and urine cultures were obtained  Requested patient to be admitted for urosepsis   Assessment and Plan: * Sepsis ([place]) - Sepsis physiology resolved -Afebrile and normotensive now.   -On admission: met sepsis criteria: Tmax 102.1, RR 24, PR 102, BP 104/40, was satting 86% on arrival, improved to  97% With endorgan compromise a on CKD, creatinine 2.32, potassium 6.2   -Likely likely UTI, urosepsis -previous culture consistent with Klebsiella.  Sensitive to broad-spectrum antibiotic exception of ampicillin -Received a dose of Rocephin, >> switched to cefepime -IV antibiotic of cefepime >> switch to p.o. ciprofloxacin  -Urine culture>> > 100 K colonies Klebsiella pneumonia (Pansensitive with exception of ampicillin) -Blood cultures>> no growth to date  -Sepsis protocol initiated: IV fluid switched from LR to NS due to hyperkalemia   Hyperkalemia - Serum potassium 6.1, 6.2 >>>> 5.9 >> 5.0, 5.1 this a.m.  -On arrival 6.2, peaked T waves, treated with Kayexalate, calcium gluconate, and Lasix  -Monitoring potassium level closely -Considering Lokelma  -We will monitor on telemetry bed closely   Toxic metabolic encephalopathy -Resolved likely due to  sepsis  - treating underlying causes of sepsis, UTI -Avoiding hallucinogenic's, and narcotics   Anemia, chronic disease - Baseline hemoglobin around 11 -Hemoglobin dropped to 8.4, 8.8, likely delusional, No signs of bleeding   HLD (hyperlipidemia) - resume Crestor   Iron deficiency - We will continue iron supplements    Fever - Resolved - Due to sepsis UTI-continue with the as needed Tylenol   Diabetes (HCC) -- Carb modified diabetic diet -Resuming home regimen meds   CKD (chronic kidney disease) stage 4, GFR 15-29 ml/min (HCC) - Baseline creatinine 1.6-1.8  -Baseline BUN 26- 34, 36, 33 -GFR baseline around 30 --- 18.28  -Acute on CKD stage 4- Creatinine 2.32 >> 2.28 >> 1.98, 1.89  GERD - Continue PPI  Essential hypertension -Hypotensive-resolved, blood pressure stabilizing  - Due to sepsis, ruling out severe sepsis and hypotension -Patient is to resume home medication of Norvasc   B12 deficiency -  continuing B12 supplements  Hypothyroidism - Continue p.o. Synthroid once tolerating p.o. - TSH 3.59 few months ago      Disposition: Home health Diet recommendation:  Discharge Diet Orders (From admission, onward)     Start     Ordered   10/26/21 0000  Diet - low sodium heart healthy        10/26/21 1008           Carb modified diet DISCHARGE MEDICATION: Allergies as of 10/26/2021       Reactions   Codeine Other (See Comments)   REACTION: change in mental status   Diphenhydramine Hcl Itching   Quinine Other (See Comments)   REACTION: abnormal sensations in face   Sulfonamide Derivatives Rash        Medication List     STOP taking these medications    amLODipine 5 MG tablet Commonly known as: NORVASC   ascorbic acid 500 MG tablet Commonly known as: VITAMIN C   ferrous sulfate 325 (65 FE) MG EC tablet   loperamide 2 MG tablet Commonly known as: IMODIUM A-D   rosuvastatin 20 MG tablet Commonly known as: CRESTOR        TAKE these medications    Accu-Chek Guide Me w/Device Kit 1 each by Does not apply route 4 (four) times daily. E11.9   acetaminophen 500 MG tablet Commonly known as: TYLENOL Take 500 mg by mouth every 6 (six) hours as needed.   acidophilus Caps capsule Take 1 capsule by mouth 3 (three) times daily with meals for 7 days.   Adjustable Lancing Device Misc Apply 1 each topically daily.   albuterol 108 (90 Base) MCG/ACT inhaler Commonly known as: VENTOLIN HFA Inhale 2 puffs into the lungs every 6 (six) hours as needed for  wheezing or shortness of breath.   aspirin 81 MG tablet Take 81 mg by mouth daily.   busPIRone 5 MG tablet Commonly known as: BUSPAR TAKE 1 TABLET BY MOUTH TWICE DAILY   ciprofloxacin 500 MG tablet Commonly known as: CIPRO Take 1 tablet (500 mg total) by mouth daily with breakfast for 5 days. Start taking on: October 27, 2021   cyanocobalamin 1000 MCG/ML injection Commonly known as: VITAMIN B12 INJECT ONE ML INTRAMUSCULARLY EVERY MONTH What changed: See the new instructions.   fenofibrate 160 MG tablet TAKE 1 TABLET BY MOUTH DAILY   gabapentin 100 MG capsule Commonly known as: NEURONTIN TAKE ONE CAPSULE BY MOUTH THREE TIMES DAILY What changed: when to take this   HumuLIN N KwikPen 100 UNIT/ML Kiwkpen Generic drug: Insulin NPH (Human) (Isophane) INJECT 54 UNITS INTO THE SKIN EVERY MORNING What changed: See the new instructions.   iron polysaccharides 150 MG capsule Commonly known as: Nu-Iron Take 1 capsule (150 mg total) by mouth daily.   latanoprost 0.005 % ophthalmic solution Commonly known as: XALATAN Place 1 drop into both eyes at bedtime.   levothyroxine 137 MCG tablet Commonly known as: SYNTHROID Take 1 tablet (137 mcg total) by mouth daily before breakfast.   NEEDLE (DISP) 25 G 25G X 5/8" Misc Commonly known as: BD Disp Needles Use as directed to administer  the b12 injection monthly   OneTouch Verio test strip Generic drug:  glucose blood 1 each by Other route 2 (two) times daily. And lancets 2/day   Vitamin D3 50 MCG (2000 UT) Tabs Take 2,000 Units by mouth 2 (two) times daily.        Discharge Exam: Filed Weights   10/23/21 1545 10/24/21 0500 10/25/21 0454  Weight: 63.5 kg 62.7 kg 63.2 kg      Physical Exam:   General:  AAO x 3,  cooperative, no distress;   HEENT:  Normocephalic, PERRL, otherwise with in Normal limits   Neuro:  CNII-XII intact. , normal motor and sensation, reflexes intact   Lungs:   Clear to auscultation BL, Respirations unlabored,  No wheezes / crackles  Cardio:    S1/S2, RRR, No murmure, No Rubs or Gallops   Abdomen:  Soft, non-tender, bowel sounds active all four quadrants, no guarding or peritoneal signs.  Muscular  skeletal:  Limited exam -global generalized weaknesses - in bed, able to move all 4 extremities,   2+ pulses,  symmetric, No pitting edema  Skin:  Dry, warm to touch, negative for any Rashes,  Wounds: Please see nursing documentation          Condition at discharge: good  The results of significant diagnostics from this hospitalization (including imaging, microbiology, ancillary and laboratory) are listed below for reference.   Imaging Studies: CT ABDOMEN PELVIS WO CONTRAST  Result Date: 10/23/2021 CLINICAL DATA:  Nausea and vomiting.  Sepsis. EXAM: CT ABDOMEN AND PELVIS WITHOUT CONTRAST TECHNIQUE: Multidetector CT imaging of the abdomen and pelvis was performed following the standard protocol without IV contrast. RADIATION DOSE REDUCTION: This exam was performed according to the departmental dose-optimization program which includes automated exposure control, adjustment of the mA and/or kV according to patient size and/or use of iterative reconstruction technique. COMPARISON:  08/25/2016. FINDINGS: Lower chest: Scarring and volume loss is identified within the left lower lobe. Moderate pericardial fluid is identified along the left heart border measuring  2.3 cm in thickness. Unchanged from previous exam. Hepatobiliary: No suspicious liver lesions. Stones within the neck of the gallbladder  are identified. The largest measures 8 mm. No gallbladder wall thickening or inflammation identified. Pancreas: Unremarkable. No pancreatic ductal dilatation or surrounding inflammatory changes. Spleen: Normal in size without focal abnormality. Adrenals/Urinary Tract: Normal adrenal glands. Bilateral renal cortical volume loss and scarring is noted. Bilateral Bosniak class 1 kidney cysts are noted. The largest arises off the anterior cortex of the interpolar right kidney measuring 1.7 cm. The small bilateral nonobstructing renal calculi are noted. These measure up to 2-3 mm. No hydronephrosis or hydroureter identified. There is gas within the lumen of the urinary bladder with air-fluid level, image 77/2. No bladder wall thickening. Stomach/Bowel: Stomach appears normal. The appendix is surgically absent. No small bowel wall thickening, inflammation, or distension. Wall thickening with intramural fatty deposition is noted involving the ascending colon. No surrounding inflammatory fat stranding or free fluid. The remaining portions of the colon are unremarkable without signs of inflammation, distension or wall thickening. Vascular/Lymphatic: Severe aortic atherosclerosis. No enlarged abdominal or pelvic lymph nodes. Reproductive: Calcified uterine fibroid is noted measuring 2.4 cm. There is a fluid density cyst within the right adnexa measuring 2.2 cm, image 61/2. Previously this was reported at 2.1 cm. Other: No free fluid or fluid collections. No signs of pneumoperitoneum. Musculoskeletal: No acute or significant osseous findings. IMPRESSION: 1. There is gas within the lumen of the urinary bladder with air-fluid level. Correlate for any clinical signs or symptoms of cystitis. 2. Wall thickening with intramural fatty deposition is noted involving the ascending colon. No surrounding  inflammatory fat stranding or free fluid identified. Findings may reflect sequelae of chronic inflammation. 3. Bilateral nonobstructing renal calculi. 4. Gallstones. 5. Moderate pericardial fluid along the left heart border is unchanged from previous exam. 6. Aortic Atherosclerosis (ICD10-I70.0). Electronically Signed   By: Kerby Moors M.D.   On: 10/23/2021 14:16   DG Chest Port 1 View  Result Date: 10/23/2021 CLINICAL DATA:  Weakness. EXAM: PORTABLE CHEST 1 VIEW COMPARISON:  August 23, 2021. FINDINGS: The heart size and mediastinal contours are within normal limits. Both lungs are clear. The visualized skeletal structures are unremarkable. IMPRESSION: No active disease. Aortic Atherosclerosis (ICD10-I70.0). Electronically Signed   By: Marijo Conception M.D.   On: 10/23/2021 13:13    Microbiology: Results for orders placed or performed during the hospital encounter of 10/23/21  Urine Culture     Status: Abnormal   Collection Time: 10/23/21 12:49 PM   Specimen: Urine, Clean Catch  Result Value Ref Range Status   Specimen Description   Final    URINE, CLEAN CATCH Performed at [facility], [address] [place], [place] [address]    Special Requests   Final    NONE Performed at [facility], [address], [place], [place] [address]    Culture >=100,000 COLONIES/mL KLEBSIELLA PNEUMONIAE (A)  Final   Report Status 10/25/2021 FINAL  Final   Organism ID, Bacteria KLEBSIELLA PNEUMONIAE (A)  Final      Susceptibility   Klebsiella pneumoniae - MIC*    AMPICILLIN >=32 RESISTANT Resistant     CEFAZOLIN <=4 SENSITIVE Sensitive     CEFEPIME <=0.12 SENSITIVE Sensitive     CEFTRIAXONE <=0.25 SENSITIVE Sensitive     CIPROFLOXACIN <=0.25 SENSITIVE Sensitive     GENTAMICIN <=1 SENSITIVE Sensitive     IMIPENEM 0.5 SENSITIVE Sensitive     NITROFURANTOIN 32 SENSITIVE Sensitive     TRIMETH/SULFA <=20 SENSITIVE Sensitive     AMPICILLIN/SULBACTAM 4 SENSITIVE Sensitive     PIP/TAZO <=4 SENSITIVE Sensitive      * >=  100,000 COLONIES/mL KLEBSIELLA PNEUMONIAE  Culture, blood (Routine x 2)     Status: None (Preliminary result)   Collection Time: 10/23/21 12:51 PM   Specimen: BLOOD  Result Value Ref Range Status   Specimen Description BLOOD LEFT ANTECUBITAL  Final   Special Requests   Final    BOTTLES DRAWN AEROBIC AND ANAEROBIC Blood Culture results may not be optimal due to an inadequate volume of blood received in culture bottles   Culture   Final    NO GROWTH 3 DAYS Performed at [facility], [address], [place], [place] [address]    Report Status PENDING  Incomplete  SARS Coronavirus 2 by RT PCR (hospital order, performed in [place] hospital lab) *cepheid single result test* Anterior Nasal Swab     Status: None   Collection Time: 10/23/21  1:01 PM   Specimen: Anterior Nasal Swab  Result Value Ref Range Status   SARS Coronavirus 2 by RT PCR NEGATIVE NEGATIVE Final    Comment: (NOTE) SARS-CoV-2 target nucleic acids are NOT DETECTED.  The SARS-CoV-2 RNA is generally detectable in upper and lower respiratory specimens during the acute phase of infection. The lowest concentration of SARS-CoV-2 viral copies this assay can detect is 250 copies / mL. A negative result does not preclude SARS-CoV-2 infection and should not be used as the sole basis for treatment or other patient management decisions.  A negative result may occur with improper specimen collection / handling, submission of specimen other than nasopharyngeal swab, presence of viral mutation(s) within the areas targeted by this assay, and inadequate number of viral copies (<250 copies / mL). A negative result must be combined with clinical observations, patient history, and epidemiological information.  Fact Sheet for Patients:   https://www.patel.info/  Fact Sheet for Healthcare Providers: https://hall.com/  This test is not yet approved or  cleared by the [place] FDA  and has been authorized for detection and/or diagnosis of SARS-CoV-2 by FDA under an Emergency Use Authorization (EUA).  This EUA will remain in effect (meaning this test can be used) for the duration of the COVID-19 declaration under Section 564(b)(1) of the Act, 21 U.S.C. section 360bbb-3(b)(1), unless the authorization is terminated or revoked sooner.  Performed at [facility], [address], [place], [place] [address]   Culture, blood (Routine x 2)     Status: None (Preliminary result)   Collection Time: 10/23/21  1:07 PM   Specimen: BLOOD  Result Value Ref Range Status   Specimen Description BLOOD BLOOD RIGHT HAND  Final   Special Requests   Final    BOTTLES DRAWN AEROBIC AND ANAEROBIC Blood Culture adequate volume   Culture   Final    NO GROWTH 3 DAYS Performed at [facility], [address], [place], [place] [address]    Report Status PENDING  Incomplete  MRSA Next Gen by PCR, Nasal     Status: None   Collection Time: 10/23/21  4:05 PM   Specimen: Nasal Mucosa; Nasal Swab  Result Value Ref Range Status   MRSA by PCR Next Gen NOT DETECTED NOT DETECTED Final    Comment: (NOTE) The GeneXpert MRSA Assay (FDA approved for NASAL specimens only), is one component of a comprehensive MRSA colonization surveillance program. It is not intended to diagnose MRSA infection nor to guide or monitor treatment for MRSA infections. Test performance is not FDA approved in patients less than 61 years old. Performed at [facility], [address] [place], [place] [address]  Labs: CBC: Recent Labs  Lab 10/22/21 1332 10/23/21 1250 10/24/21 0330 10/25/21 0508 10/26/21 0510  WBC 6.7 10.1 5.7 5.1 5.3  NEUTROABS 4.0 7.2  --   --   --   HGB 9.7* 10.2* 9.1* 8.4* 8.8*  HCT 29.2* 32.7* 29.4* 27.0* 28.2*  MCV 95.0 100.3* 100.3* 100.0 99.6  PLT 239.0 244 237 230 910   Basic Metabolic Panel: Recent Labs  Lab 10/23/21 1250 10/23/21 1529 10/23/21 1918 10/24/21 0330 10/24/21 1208  10/25/21 0508 10/26/21 0510  NA 134* 134*  --  134* 138 136 133*  K 6.2* 6.5* 5.9* 5.9* 4.3 5.0 5.1  CL 110 111  --  111 118* 110 107  CO2 16* 13*  --  17* 16* 18* 16*  GLUCOSE 150* 157*  --  191* 125* 121* 121*  BUN 36* 36*  --  32* 27* 33* 39*  CREATININE 2.28* 2.30*  --  2.00* 1.71* 1.98* 1.89*  CALCIUM 9.5 9.7  --  9.6 8.0* 9.6 9.6  MG 1.9 2.0  --   --   --   --   --   PHOS  --  3.7  --   --   --   --   --    Liver Function Tests: Recent Labs  Lab 10/22/21 1332 10/23/21 1250 10/24/21 0330 10/25/21 0508 10/26/21 0510  AST '23 23 20 21 26  ' ALT '12 15 13 12 14  ' ALKPHOS 35* 36* 30* 26* 27*  BILITOT 0.6 1.4* 0.8 0.6 0.8  PROT 7.0 7.6 6.9 6.4* 6.9  ALBUMIN 3.6 3.7 3.3* 3.1* 3.2*   CBG: Recent Labs  Lab 10/25/21 0810 10/25/21 1125 10/25/21 1612 10/25/21 2113 10/26/21 0013  GLUCAP 118* 123* 128* 106* 115*    Discharge time spent: greater than 30 minutes.  Signed: Deatra James, MD [facility] 10/26/2021

## 2021-10-27 DIAGNOSIS — A419 Sepsis, unspecified organism: Secondary | ICD-10-CM | POA: Diagnosis not present

## 2021-10-27 DIAGNOSIS — G928 Other toxic encephalopathy: Secondary | ICD-10-CM | POA: Diagnosis not present

## 2021-10-27 DIAGNOSIS — D638 Anemia in other chronic diseases classified elsewhere: Secondary | ICD-10-CM | POA: Diagnosis not present

## 2021-10-27 DIAGNOSIS — R6520 Severe sepsis without septic shock: Secondary | ICD-10-CM | POA: Diagnosis not present

## 2021-10-27 LAB — CBC
HCT: 29 % — ABNORMAL LOW (ref 36.0–46.0)
Hemoglobin: 8.8 g/dL — ABNORMAL LOW (ref 12.0–15.0)
MCH: 30.9 pg (ref 26.0–34.0)
MCHC: 30.3 g/dL (ref 30.0–36.0)
MCV: 101.8 fL — ABNORMAL HIGH (ref 80.0–100.0)
Platelets: 245 10*3/uL (ref 150–400)
RBC: 2.85 MIL/uL — ABNORMAL LOW (ref 3.87–5.11)
RDW: 14.4 % (ref 11.5–15.5)
WBC: 5.1 10*3/uL (ref 4.0–10.5)
nRBC: 0 % (ref 0.0–0.2)

## 2021-10-27 LAB — GLUCOSE, CAPILLARY
Glucose-Capillary: 127 mg/dL — ABNORMAL HIGH (ref 70–99)
Glucose-Capillary: 122 mg/dL — ABNORMAL HIGH (ref 70–99)
Glucose-Capillary: 137 mg/dL — ABNORMAL HIGH (ref 70–99)

## 2021-10-27 LAB — BASIC METABOLIC PANEL
Anion gap: 13 (ref 5–15)
BUN: 45 mg/dL — ABNORMAL HIGH (ref 8–23)
CO2: 14 mmol/L — ABNORMAL LOW (ref 22–32)
Calcium: 9.4 mg/dL (ref 8.9–10.3)
Chloride: 107 mmol/L (ref 98–111)
Creatinine, Ser: 1.98 mg/dL — ABNORMAL HIGH (ref 0.44–1.00)
GFR, Estimated: 24 mL/min — ABNORMAL LOW (ref >60)
Glucose, Bld: 137 mg/dL — ABNORMAL HIGH (ref 70–99)
Potassium: 4.9 mmol/L (ref 3.5–5.1)
Sodium: 134 mmol/L — ABNORMAL LOW (ref 135–145)

## 2021-10-27 MED ORDER — GABAPENTIN 100 MG PO CAPS: 100.0000 mg | ORAL_CAPSULE | Freq: Two times a day (BID) | ORAL | Status: DC

## 2021-10-27 MED ORDER — SODIUM CHLORIDE 0.9 % IV SOLN: INTRAVENOUS | Status: DC

## 2021-10-27 NOTE — Discharge Summary (Signed)
Physician Discharge Summary   Patient: Jocelyn Mendez MRN: [id number] DOB: May 08, 1930  Admit date:     10/23/2021  Discharge date: 10/27/21  Discharge Physician: Deatra James   PCP: Hoyt Koch, MD   Recommendations at discharge:  Follow-up with PCP in 1 week Repeat BMP within 1 week with follow-up with PCP, to monitor potassium level Avoid any over-the-counter or high potassium diet or supplements   Patient discharged was held back yesterday as patient was found more sleepy, apparently she was taking higher dose of Neurontin than her baseline thus causing her more somnolent (back to home dose)  Today patient was seen and examined, awake alert oriented, hemodynamically stable, cleared for discharge  Discharge Diagnoses: Principal Problem:   Sepsis ([place]) Active Problems:   Toxic metabolic encephalopathy   Hyperkalemia   Anemia, chronic disease   Hypothyroidism   B12 deficiency   Essential hypertension   GERD   CKD (chronic kidney disease) stage 4, GFR 15-29 ml/min (HCC)   Diabetes ([place])   Fever   Iron deficiency   HLD (hyperlipidemia)  Resolved Problems:   * No resolved hospital problems. *  Hospital Course: Jocelyn Mendez is a 88 year old female presenting from home with altered mental status.  Medical history of DM 2 (last A1c 6.2 in July 23), HTN, HLD, CKD stage 4, GERD, hypothyroidism, depression, iron deficiency anemia, and asthma.   Patient is accompanied by 2 sons who reported patient "got sick " very quickly overnight, developed fever, more confused now.  It looks like she was going to pass out.  She saw her endocrinologist yesterday who started her on potassium low-dose and advised to go to PCP.  Patient had some nausea and abdominal pain but no vomiting denied have diarrhea constipation  she has been complaining of dysuria and urinary incontinency..  Her urine has been smelling foul odor.     ED course: Blood pressure (!) 123/59, pulse 93,  temperature 99.5 F (37.5 C), temperature source Oral, resp. rate 19, height '5\' 6"'  (1.676 m), weight 65.3 kg, SpO2 98 %.     Latest Ref Rng & Units 10/23/2021   12:50 PM 10/22/2021    1:32 PM 09/04/2021    2:25 PM  CMP  Glucose 70 - 99 mg/dL 150  75  105   BUN 8 - 23 mg/dL 36  38    Creatinine 0.44 - 1.00 mg/dL 2.28  2.32    Sodium 135 - 145 mmol/L 134  135    Potassium 3.5 - 5.1 mmol/L 6.2  6.1 No hemolysis seen    Chloride 98 - 111 mmol/L 110  108    CO2 22 - 32 mmol/L 16  21    Calcium 8.9 - 10.3 mg/dL 9.5  9.6    Total Protein 6.5 - 8.1 g/dL 7.6  7.0    Total Bilirubin 0.3 - 1.2 mg/dL 1.4  0.6    Alkaline Phos 38 - 126 U/L 36  35    AST 15 - 41 U/L 23  23    ALT 0 - 44 U/L 15  12      UA positive for large leukocyte esterase, many bacteria, > 50 WBCs  -On arrival: met sepsis criteria: Tmax 102.1, RR 24, PR 102, BP 104/40, was satting 86% on arrival, improved to 97% With endorgan compromise a on CKD, creatinine 2.32, potassium 6.2  Patient was given a liter of IV normal saline, IV Rocephin, calcium gluconate 1 g Blood and urine  cultures were obtained  Requested patient to be admitted for urosepsis   Assessment and Plan: * Sepsis ([place]) - Sepsis physiology resolved -Afebrile and normotensive now.   -On admission: met sepsis criteria: Tmax 102.1, RR 24, PR 102, BP 104/40, was satting 86% on arrival, improved to 97% With endorgan compromise a on CKD, creatinine 2.32, potassium 6.2   -Likely likely UTI, urosepsis -previous culture consistent with Klebsiella.  Sensitive to broad-spectrum antibiotic exception of ampicillin -Received a dose of Rocephin, >> switched to cefepime -IV antibiotic of cefepime >> switch to p.o. ciprofloxacin  -Urine culture>> > 100 K colonies Klebsiella pneumonia (Pansensitive with exception of ampicillin) -Blood cultures>> no growth to date  -Sepsis protocol initiated: IV fluid switched from LR to NS due to hyperkalemia   Hyperkalemia -  Serum potassium 6.1, 6.2 >>>> 5.9 >> 5.0, 5.1 this a.m.  -On arrival 6.2, peaked T waves, treated with Kayexalate, calcium gluconate, and Lasix  -Monitoring potassium level closely -Considering Lokelma  -We will monitor on telemetry bed closely   Toxic metabolic encephalopathy -Resolved likely due to sepsis  - treating underlying causes of sepsis, UTI -Avoiding hallucinogenic's, and narcotics   Anemia, chronic disease - Baseline hemoglobin around 11 -Hemoglobin dropped to 8.4, 8.8, likely delusional, No signs of bleeding   HLD (hyperlipidemia) - resume Crestor   Iron deficiency - We will continue iron supplements    Fever - Resolved - Due to sepsis UTI-continue with the as needed Tylenol   Diabetes (HCC) -- Carb modified diabetic diet -Resuming home regimen meds   CKD (chronic kidney disease) stage 4, GFR 15-29 ml/min (HCC) - Baseline creatinine 1.6-1.8  -Baseline BUN 26- 34, 36, 33 -GFR baseline around 30 --- 18.28  -Acute on CKD stage 4- Creatinine 2.32 >> 2.28 >> 1.98, 1.89  GERD - Continue PPI  Essential hypertension -Hypotensive-resolved, blood pressure stabilizing  - Due to sepsis, ruling out severe sepsis and hypotension -Patient is to resume home medication of Norvasc   B12 deficiency -  continuing B12 supplements  Hypothyroidism - Continue p.o. Synthroid once tolerating p.o. - TSH 3.59 few months ago      Disposition: Home health Diet recommendation:  Discharge Diet Orders (From admission, onward)     Start     Ordered   10/26/21 0000  Diet - low sodium heart healthy        10/26/21 1008           Carb modified diet DISCHARGE MEDICATION: Allergies as of 10/27/2021       Reactions   Codeine Other (See Comments)   REACTION: change in mental status   Diphenhydramine Hcl Itching   Quinine Other (See Comments)   REACTION: abnormal sensations in face   Sulfonamide Derivatives Rash        Medication List     STOP  taking these medications    amLODipine 5 MG tablet Commonly known as: NORVASC   ascorbic acid 500 MG tablet Commonly known as: VITAMIN C   ferrous sulfate 325 (65 FE) MG EC tablet   loperamide 2 MG tablet Commonly known as: IMODIUM A-D   rosuvastatin 20 MG tablet Commonly known as: CRESTOR       TAKE these medications    Accu-Chek Guide Me w/Device Kit 1 each by Does not apply route 4 (four) times daily. E11.9   acetaminophen 500 MG tablet Commonly known as: TYLENOL Take 500 mg by mouth every 6 (six) hours as needed.   acidophilus Caps capsule Take 1  capsule by mouth 3 (three) times daily with meals for 7 days.   Adjustable Lancing Device Misc Apply 1 each topically daily.   albuterol 108 (90 Base) MCG/ACT inhaler Commonly known as: VENTOLIN HFA Inhale 2 puffs into the lungs every 6 (six) hours as needed for wheezing or shortness of breath.   aspirin 81 MG tablet Take 81 mg by mouth daily.   busPIRone 5 MG tablet Commonly known as: BUSPAR TAKE 1 TABLET BY MOUTH TWICE DAILY   ciprofloxacin 500 MG tablet Commonly known as: CIPRO Take 1 tablet (500 mg total) by mouth daily with breakfast for 5 days.   cyanocobalamin 1000 MCG/ML injection Commonly known as: VITAMIN B12 INJECT ONE ML INTRAMUSCULARLY EVERY MONTH What changed: See the new instructions.   fenofibrate 160 MG tablet TAKE 1 TABLET BY MOUTH DAILY   gabapentin 100 MG capsule Commonly known as: NEURONTIN TAKE ONE CAPSULE BY MOUTH THREE TIMES DAILY What changed: when to take this   HumuLIN N KwikPen 100 UNIT/ML Kiwkpen Generic drug: Insulin NPH (Human) (Isophane) INJECT 54 UNITS INTO THE SKIN EVERY MORNING What changed: See the new instructions.   iron polysaccharides 150 MG capsule Commonly known as: Nu-Iron Take 1 capsule (150 mg total) by mouth daily.   latanoprost 0.005 % ophthalmic solution Commonly known as: XALATAN Place 1 drop into both eyes at bedtime.   levothyroxine 137 MCG  tablet Commonly known as: SYNTHROID Take 1 tablet (137 mcg total) by mouth daily before breakfast.   NEEDLE (DISP) 25 G 25G X 5/8" Misc Commonly known as: BD Disp Needles Use as directed to administer  the b12 injection monthly   OneTouch Verio test strip Generic drug: glucose blood 1 each by Other route 2 (two) times daily. And lancets 2/day   Vitamin D3 50 MCG (2000 UT) Tabs Take 2,000 Units by mouth 2 (two) times daily.        Discharge Exam: Filed Weights   10/24/21 0500 10/25/21 0454 10/27/21 0641  Weight: 62.7 kg 63.2 kg 62.4 kg      Physical Exam:   General:  AAO x 3,  cooperative, no distress;   HEENT:  Normocephalic, PERRL, otherwise with in Normal limits   Neuro:  CNII-XII intact. , normal motor and sensation, reflexes intact   Lungs:   Clear to auscultation BL, Respirations unlabored,  No wheezes / crackles  Cardio:    S1/S2, RRR, No murmure, No Rubs or Gallops   Abdomen:  Soft, non-tender, bowel sounds active all four quadrants, no guarding or peritoneal signs.  Muscular  skeletal:  Limited exam -global generalized weaknesses - in bed, able to move all 4 extremities,   2+ pulses,  symmetric, No pitting edema  Skin:  Dry, warm to touch, negative for any Rashes,  Wounds: Please see nursing documentation         Condition at discharge: good  The results of significant diagnostics from this hospitalization (including imaging, microbiology, ancillary and laboratory) are listed below for reference.   Imaging Studies: DG Abd 1 View  Result Date: 10/26/2021 CLINICAL DATA:  Pain. EXAM: ABDOMEN - 1 VIEW COMPARISON:  CT abdomen pelvis dated 10/23/2021. FINDINGS: The bowel gas pattern is normal. Air-fluid levels and free intraperitoneal air can not be excluded on the supine exam. The inferior aspect of the pelvis is not imaged. No radio-opaque calculi overlying the expected courses of the urinary tracts. IMPRESSION: Nonobstructive bowel gas pattern.  Electronically Signed   By: Zerita Boers M.D.   On:  10/26/2021 13:21   CT ABDOMEN PELVIS WO CONTRAST  Result Date: 10/23/2021 CLINICAL DATA:  Nausea and vomiting.  Sepsis. EXAM: CT ABDOMEN AND PELVIS WITHOUT CONTRAST TECHNIQUE: Multidetector CT imaging of the abdomen and pelvis was performed following the standard protocol without IV contrast. RADIATION DOSE REDUCTION: This exam was performed according to the departmental dose-optimization program which includes automated exposure control, adjustment of the mA and/or kV according to patient size and/or use of iterative reconstruction technique. COMPARISON:  08/25/2016. FINDINGS: Lower chest: Scarring and volume loss is identified within the left lower lobe. Moderate pericardial fluid is identified along the left heart border measuring 2.3 cm in thickness. Unchanged from previous exam. Hepatobiliary: No suspicious liver lesions. Stones within the neck of the gallbladder are identified. The largest measures 8 mm. No gallbladder wall thickening or inflammation identified. Pancreas: Unremarkable. No pancreatic ductal dilatation or surrounding inflammatory changes. Spleen: Normal in size without focal abnormality. Adrenals/Urinary Tract: Normal adrenal glands. Bilateral renal cortical volume loss and scarring is noted. Bilateral Bosniak class 1 kidney cysts are noted. The largest arises off the anterior cortex of the interpolar right kidney measuring 1.7 cm. The small bilateral nonobstructing renal calculi are noted. These measure up to 2-3 mm. No hydronephrosis or hydroureter identified. There is gas within the lumen of the urinary bladder with air-fluid level, image 77/2. No bladder wall thickening. Stomach/Bowel: Stomach appears normal. The appendix is surgically absent. No small bowel wall thickening, inflammation, or distension. Wall thickening with intramural fatty deposition is noted involving the ascending colon. No surrounding inflammatory fat stranding or  free fluid. The remaining portions of the colon are unremarkable without signs of inflammation, distension or wall thickening. Vascular/Lymphatic: Severe aortic atherosclerosis. No enlarged abdominal or pelvic lymph nodes. Reproductive: Calcified uterine fibroid is noted measuring 2.4 cm. There is a fluid density cyst within the right adnexa measuring 2.2 cm, image 61/2. Previously this was reported at 2.1 cm. Other: No free fluid or fluid collections. No signs of pneumoperitoneum. Musculoskeletal: No acute or significant osseous findings. IMPRESSION: 1. There is gas within the lumen of the urinary bladder with air-fluid level. Correlate for any clinical signs or symptoms of cystitis. 2. Wall thickening with intramural fatty deposition is noted involving the ascending colon. No surrounding inflammatory fat stranding or free fluid identified. Findings may reflect sequelae of chronic inflammation. 3. Bilateral nonobstructing renal calculi. 4. Gallstones. 5. Moderate pericardial fluid along the left heart border is unchanged from previous exam. 6. Aortic Atherosclerosis (ICD10-I70.0). Electronically Signed   By: Kerby Moors M.D.   On: 10/23/2021 14:16   DG Chest Port 1 View  Result Date: 10/23/2021 CLINICAL DATA:  Weakness. EXAM: PORTABLE CHEST 1 VIEW COMPARISON:  August 23, 2021. FINDINGS: The heart size and mediastinal contours are within normal limits. Both lungs are clear. The visualized skeletal structures are unremarkable. IMPRESSION: No active disease. Aortic Atherosclerosis (ICD10-I70.0). Electronically Signed   By: Marijo Conception M.D.   On: 10/23/2021 13:13    Microbiology: Results for orders placed or performed during the hospital encounter of 10/23/21  Urine Culture     Status: Abnormal   Collection Time: 10/23/21 12:49 PM   Specimen: Urine, Clean Catch  Result Value Ref Range Status   Specimen Description   Final    URINE, CLEAN CATCH Performed at [facility], [address],  [place], [place] [address]    Special Requests   Final    NONE Performed at [facility], [address] [place], [place] [address]  Culture >=100,000 COLONIES/mL KLEBSIELLA PNEUMONIAE (A)  Final   Report Status 10/25/2021 FINAL  Final   Organism ID, Bacteria KLEBSIELLA PNEUMONIAE (A)  Final      Susceptibility   Klebsiella pneumoniae - MIC*    AMPICILLIN >=32 RESISTANT Resistant     CEFAZOLIN <=4 SENSITIVE Sensitive     CEFEPIME <=0.12 SENSITIVE Sensitive     CEFTRIAXONE <=0.25 SENSITIVE Sensitive     CIPROFLOXACIN <=0.25 SENSITIVE Sensitive     GENTAMICIN <=1 SENSITIVE Sensitive     IMIPENEM 0.5 SENSITIVE Sensitive     NITROFURANTOIN 32 SENSITIVE Sensitive     TRIMETH/SULFA <=20 SENSITIVE Sensitive     AMPICILLIN/SULBACTAM 4 SENSITIVE Sensitive     PIP/TAZO <=4 SENSITIVE Sensitive     * >=100,000 COLONIES/mL KLEBSIELLA PNEUMONIAE  Culture, blood (Routine x 2)     Status: None (Preliminary result)   Collection Time: 10/23/21 12:51 PM   Specimen: BLOOD  Result Value Ref Range Status   Specimen Description BLOOD LEFT ANTECUBITAL  Final   Special Requests   Final    BOTTLES DRAWN AEROBIC AND ANAEROBIC Blood Culture results may not be optimal due to an inadequate volume of blood received in culture bottles   Culture   Final    NO GROWTH 4 DAYS Performed at [facility], [address], [place], [place] [address]    Report Status PENDING  Incomplete  SARS Coronavirus 2 by RT PCR (hospital order, performed in [place] hospital lab) *cepheid single result test* Anterior Nasal Swab     Status: None   Collection Time: 10/23/21  1:01 PM   Specimen: Anterior Nasal Swab  Result Value Ref Range Status   SARS Coronavirus 2 by RT PCR NEGATIVE NEGATIVE Final    Comment: (NOTE) SARS-CoV-2 target nucleic acids are NOT DETECTED.  The SARS-CoV-2 RNA is generally detectable in upper and lower respiratory specimens during the acute phase of infection. The lowest concentration of SARS-CoV-2  viral copies this assay can detect is 250 copies / mL. A negative result does not preclude SARS-CoV-2 infection and should not be used as the sole basis for treatment or other patient management decisions.  A negative result may occur with improper specimen collection / handling, submission of specimen other than nasopharyngeal swab, presence of viral mutation(s) within the areas targeted by this assay, and inadequate number of viral copies (<250 copies / mL). A negative result must be combined with clinical observations, patient history, and epidemiological information.  Fact Sheet for Patients:   https://www.patel.info/  Fact Sheet for Healthcare Providers: https://hall.com/  This test is not yet approved or  cleared by the [place] FDA and has been authorized for detection and/or diagnosis of SARS-CoV-2 by FDA under an Emergency Use Authorization (EUA).  This EUA will remain in effect (meaning this test can be used) for the duration of the COVID-19 declaration under Section 564(b)(1) of the Act, 21 U.S.C. section 360bbb-3(b)(1), unless the authorization is terminated or revoked sooner.  Performed at [facility], [address] [place], [place] [address]   Culture, blood (Routine x 2)     Status: None (Preliminary result)   Collection Time: 10/23/21  1:07 PM   Specimen: BLOOD  Result Value Ref Range Status   Specimen Description BLOOD BLOOD RIGHT HAND  Final   Special Requests   Final    BOTTLES DRAWN AEROBIC AND ANAEROBIC Blood Culture adequate volume   Culture   Final    NO GROWTH 4 DAYS Performed at [facility], [address], [place], [place] [address]    Report Status PENDING  Incomplete  MRSA Next Gen by PCR, Nasal     Status: None   Collection Time: 10/23/21  4:05 PM   Specimen: Nasal Mucosa; Nasal Swab  Result Value Ref Range Status   MRSA by PCR Next Gen NOT DETECTED NOT DETECTED Final    Comment: (NOTE) The  GeneXpert MRSA Assay (FDA approved for NASAL specimens only), is one component of a comprehensive MRSA colonization surveillance program. It is not intended to diagnose MRSA infection nor to guide or monitor treatment for MRSA infections. Test performance is not FDA approved in patients less than 54 years old. Performed at [facility], [address], [place], [place] [address]     Labs: CBC: Recent Labs  Lab 10/22/21 1332 10/23/21 1250 10/24/21 0330 10/25/21 0508 10/26/21 0510 10/27/21 0525  WBC 6.7 10.1 5.7 5.1 5.3 5.1  NEUTROABS 4.0 7.2  --   --   --   --   HGB 9.7* 10.2* 9.1* 8.4* 8.8* 8.8*  HCT 29.2* 32.7* 29.4* 27.0* 28.2* 29.0*  MCV 95.0 100.3* 100.3* 100.0 99.6 101.8*  PLT 239.0 244 237 230 244 322   Basic Metabolic Panel: Recent Labs  Lab 10/23/21 1250 10/23/21 1529 10/23/21 1918 10/24/21 0330 10/24/21 1208 10/25/21 0508 10/26/21 0510 10/27/21 0525  NA 134* 134*  --  134* 138 136 133* 134*  K 6.2* 6.5*   < > 5.9* 4.3 5.0 5.1 4.9  CL 110 111  --  111 118* 110 107 107  CO2 16* 13*  --  17* 16* 18* 16* 14*  GLUCOSE 150* 157*  --  191* 125* 121* 121* 137*  BUN 36* 36*  --  32* 27* 33* 39* 45*  CREATININE 2.28* 2.30*  --  2.00* 1.71* 1.98* 1.89* 1.98*  CALCIUM 9.5 9.7  --  9.6 8.0* 9.6 9.6 9.4  MG 1.9 2.0  --   --   --   --   --   --   PHOS  --  3.7  --   --   --   --   --   --    < > = values in this interval not displayed.   Liver Function Tests: Recent Labs  Lab 10/22/21 1332 10/23/21 1250 10/24/21 0330 10/25/21 0508 10/26/21 0510  AST '23 23 20 21 26  ' ALT '12 15 13 12 14  ' ALKPHOS 35* 36* 30* 26* 27*  BILITOT 0.6 1.4* 0.8 0.6 0.8  PROT 7.0 7.6 6.9 6.4* 6.9  ALBUMIN 3.6 3.7 3.3* 3.1* 3.2*   CBG: Recent Labs  Lab 10/26/21 1602 10/26/21 2029 10/26/21 2341 10/27/21 0449 10/27/21 0715  GLUCAP 104* 104* 118* 127* 122*    Discharge time spent: greater than 30 minutes.  Signed: Deatra James, MD [facility] 10/27/2021

## 2021-10-27 NOTE — Progress Notes (Signed)
Patient discharged home with son Jocelyn Mendez via wheelchair in private vehicle in stable condition. Discharge instructions and paperwork given to son.

## 2021-10-28 ENCOUNTER — Telehealth: Payer: Self-pay

## 2021-10-28 LAB — CULTURE, BLOOD (ROUTINE X 2)
Culture: NO GROWTH
Culture: NO GROWTH
Special Requests: ADEQUATE

## 2021-10-28 NOTE — Telephone Encounter (Signed)
Transition Care Management Follow-up Telephone Call Date of discharge and from where: [place] 10-27-21 Dx: Sepsis How have you been since you were released from the hospital? Doing ok  Any questions or concerns? No  Items Reviewed: Did the pt receive and understand the discharge instructions provided? Yes  Medications obtained and verified? Yes  Other? No  Any new allergies since your discharge? No  Dietary orders reviewed? Yes Do you have support at home? Yes   Home Care and Equipment/Supplies: Were home health services ordered? no If so, what is the name of the agency? na  Has the agency set up a time to come to the patient's home? not applicable Were any new equipment or medical supplies ordered?  No What is the name of the medical supply agency? na Were you able to get the supplies/equipment? not applicable Do you have any questions related to the use of the equipment or supplies? No  Functional Questionnaire: (I = Independent and D = Dependent) ADLs: D  Bathing/Dressing- D  Meal Prep- D  Eating- I  Maintaining continence- I  Transferring/Ambulation- I-WALKER  Managing Meds- D  Follow up appointments reviewed:  PCP Hospital f/u appt confirmed? Yes  [facility]? No  . Are transportation arrangements needed? No  If their condition worsens, is the pt aware to call PCP or go to the Emergency Dept.? Yes Was the patient provided with contact information for the PCP's office or ED? Yes Was to pt encouraged to call back with questions or concerns? Yes

## 2021-10-31 ENCOUNTER — Ambulatory Visit (INDEPENDENT_AMBULATORY_CARE_PROVIDER_SITE_OTHER): Payer: Medicare Other | Admitting: Internal Medicine

## 2021-10-31 ENCOUNTER — Encounter: Payer: Self-pay | Admitting: Internal Medicine

## 2021-10-31 DIAGNOSIS — E875 Hyperkalemia: Secondary | ICD-10-CM

## 2021-10-31 DIAGNOSIS — N184 Chronic kidney disease, stage 4 (severe): Secondary | ICD-10-CM

## 2021-10-31 MED ORDER — HYDROCORTISONE (PERIANAL) 2.5 % EX CREA: 1.0000 | TOPICAL_CREAM | Freq: Two times a day (BID) | CUTANEOUS | 0 refills | Status: DC

## 2021-10-31 NOTE — Assessment & Plan Note (Signed)
Back to normal on discharge and suspect related to infection and change in renal function. She did not want to check today so will check on follow up.

## 2021-10-31 NOTE — Patient Instructions (Signed)
We do not need any labs today.   

## 2021-10-31 NOTE — Progress Notes (Signed)
   Subjective:   Patient ID: Jocelyn Mendez, female    DOB: 04/18/1930, 88 y.o.   MRN: [id number]  HPI The patient is an 88 YO female coming in for hospital follow up urosepsis. Doing well overall. Appetite returning in last day or so.   Review of Systems  Constitutional:  Positive for activity change and appetite change.  HENT: Negative.    Eyes: Negative.   Respiratory:  Negative for cough, chest tightness and shortness of breath.   Cardiovascular:  Negative for chest pain, palpitations and leg swelling.  Gastrointestinal:  Negative for abdominal distention, abdominal pain, constipation, diarrhea, nausea and vomiting.  Musculoskeletal:  Positive for gait problem.  Skin: Negative.   Neurological:  Positive for weakness.  Psychiatric/Behavioral: Negative.      Objective:  Physical Exam Constitutional:      Appearance: She is well-developed.  HENT:     Head: Normocephalic and atraumatic.  Cardiovascular:     Rate and Rhythm: Normal rate and regular rhythm.  Pulmonary:     Effort: Pulmonary effort is normal. No respiratory distress.     Breath sounds: Normal breath sounds. No wheezing or rales.  Abdominal:     General: Bowel sounds are normal. There is no distension.     Palpations: Abdomen is soft.     Tenderness: There is no abdominal tenderness. There is no rebound.  Musculoskeletal:     Cervical back: Normal range of motion.  Skin:    General: Skin is warm and dry.  Neurological:     Mental Status: She is alert and oriented to person, place, and time.     Coordination: Coordination abnormal.     Vitals:   10/31/21 0952  BP: 116/72  Pulse: 93  Temp: 98.8 F (37.1 C)  SpO2: 98%  Weight: 141 lb (64 kg)  Height: 5\' 6"  (1.676 m)    Assessment & Plan:

## 2021-10-31 NOTE — Assessment & Plan Note (Signed)
Did have acute worsening in hospital which was improved to baseline on discharge. Likely infection was trigger for this. We deferred labs today and will check on follow up.

## 2021-11-01 ENCOUNTER — Telehealth: Payer: Self-pay | Admitting: Internal Medicine

## 2021-11-01 NOTE — Telephone Encounter (Signed)
Patient's daughter said that her mom is out of her antibiotic - they would like a refill sent to [facility] on [address]

## 2021-11-05 NOTE — Telephone Encounter (Signed)
She should not need refill. That medication was long enough to treat her urine infection.

## 2021-11-05 NOTE — Telephone Encounter (Signed)
Please advise 

## 2021-11-05 NOTE — Telephone Encounter (Signed)
Pt daughter--stated pt doing much better.

## 2021-11-19 ENCOUNTER — Ambulatory Visit: Payer: Medicare Other | Admitting: Internal Medicine

## 2021-11-25 ENCOUNTER — Other Ambulatory Visit: Payer: Self-pay | Admitting: Internal Medicine

## 2021-12-23 ENCOUNTER — Ambulatory Visit (INDEPENDENT_AMBULATORY_CARE_PROVIDER_SITE_OTHER): Payer: Medicare Other

## 2021-12-23 ENCOUNTER — Telehealth: Payer: Self-pay | Admitting: Internal Medicine

## 2021-12-23 ENCOUNTER — Ambulatory Visit (INDEPENDENT_AMBULATORY_CARE_PROVIDER_SITE_OTHER): Payer: Medicare Other | Admitting: Internal Medicine

## 2021-12-23 ENCOUNTER — Encounter: Payer: Self-pay | Admitting: Internal Medicine

## 2021-12-23 VITALS — BP 138/80 | HR 109 | Temp 97.4°F

## 2021-12-23 DIAGNOSIS — R110 Nausea: Secondary | ICD-10-CM | POA: Diagnosis not present

## 2021-12-23 DIAGNOSIS — R531 Weakness: Secondary | ICD-10-CM | POA: Diagnosis not present

## 2021-12-23 DIAGNOSIS — K5900 Constipation, unspecified: Secondary | ICD-10-CM | POA: Diagnosis not present

## 2021-12-23 DIAGNOSIS — R109 Unspecified abdominal pain: Secondary | ICD-10-CM | POA: Diagnosis not present

## 2021-12-23 LAB — RENAL FUNCTION PANEL
Albumin: 3.3 g/dL — ABNORMAL LOW (ref 3.5–5.2)
BUN: 61 mg/dL — ABNORMAL HIGH (ref 6–23)
CO2: 17 mEq/L — ABNORMAL LOW (ref 19–32)
Calcium: 10.2 mg/dL (ref 8.4–10.5)
Chloride: 104 mEq/L (ref 96–112)
Creatinine, Ser: 2.31 mg/dL — ABNORMAL HIGH (ref 0.40–1.20)
GFR: 18.35 mL/min — ABNORMAL LOW (ref >60.00)
Glucose, Bld: 162 mg/dL — ABNORMAL HIGH (ref 70–99)
Phosphorus: 3.9 mg/dL (ref 2.3–4.6)
Potassium: 4.6 mEq/L (ref 3.5–5.1)
Sodium: 134 mEq/L — ABNORMAL LOW (ref 135–145)

## 2021-12-23 LAB — CBC
HCT: 31.9 % — ABNORMAL LOW (ref 36.0–46.0)
Hemoglobin: 10.2 g/dL — ABNORMAL LOW (ref 12.0–15.0)
MCHC: 32 g/dL (ref 30.0–36.0)
MCV: 94.3 fl (ref 78.0–100.0)
Platelets: 261 10*3/uL (ref 150.0–400.0)
RBC: 3.38 Mil/uL — ABNORMAL LOW (ref 3.87–5.11)
RDW: 14.8 % (ref 11.5–15.5)
WBC: 9 10*3/uL (ref 4.0–10.5)

## 2021-12-23 MED ORDER — CEPHALEXIN 250 MG PO CAPS: 250.0000 mg | ORAL_CAPSULE | Freq: Two times a day (BID) | ORAL | 0 refills | Status: AC

## 2021-12-23 NOTE — Progress Notes (Signed)
   Subjective:   Patient ID: Jocelyn Mendez, female    DOB: 11/18/1930, 88 y.o.   MRN: [id number]  HPI The patient is an 88 YO female coming in for feeling sick. Sons with her today and last week or so feeling nauseous in the morning. Constipation and straining a lot when she has to go. Using otc to help but not effective. Nauseous and dry heaving more in morning. Ate broth last night without vomiting mostly drinking water only since then.   PMH, FMh, social history reviewed and updated  Review of Systems  Unable to perform ROS: Mental status change  Constitutional:  Positive for activity change, appetite change and fatigue.  Gastrointestinal:  Positive for constipation and nausea. Negative for abdominal distention, abdominal pain, anal bleeding and blood in stool.    Objective:  Physical Exam Constitutional:      Appearance: She is well-developed. She is ill-appearing.  HENT:     Head: Normocephalic and atraumatic.  Cardiovascular:     Rate and Rhythm: Normal rate and regular rhythm.  Pulmonary:     Effort: No respiratory distress.     Breath sounds: Normal breath sounds. No wheezing or rales.  Abdominal:     General: There is no distension.     Palpations: Abdomen is soft.     Tenderness: There is no abdominal tenderness. There is no rebound.     Comments: Bowel sounds hypoactive  Musculoskeletal:     Cervical back: Normal range of motion.  Skin:    General: Skin is warm and dry.  Neurological:     Mental Status: She is alert and oriented to person, place, and time.     Coordination: Coordination abnormal.     Vitals:   12/23/21 1401  BP: 138/80  Pulse: (!) 109  Temp: (!) 97.4 F (36.3 C)  TempSrc: Oral  SpO2: 95%    Assessment & Plan:

## 2021-12-23 NOTE — Assessment & Plan Note (Signed)
Worse lately concerning for infection, worsening renal function or SBO given concurrent CKD stage 4 as well as constipation recently.

## 2021-12-23 NOTE — Telephone Encounter (Signed)
He asked for a call back to confirm at [phone]

## 2021-12-23 NOTE — Telephone Encounter (Signed)
Patients son called and wanted to make sure that any meds that get sent in for today for the UTI get sent to [facility] on [address]

## 2021-12-23 NOTE — Patient Instructions (Signed)
We will check to see what is going on.

## 2021-12-23 NOTE — Assessment & Plan Note (Addendum)
She has had similar symptoms with UTI. Checking U/A and culture as well as CBC and renal function. She also has constipation with decreased sounds on exam so checking x-ray abdomen. Treat as appropriate. Eating poorly and intake is minimal. Will send in keflex 250 mg BID empirically for possible UTI and adjust treatment depending on imaging and labs.

## 2021-12-23 NOTE — Assessment & Plan Note (Signed)
Checking x-ray abdomen to rule out SBO. Bowel sounds are hypoactive on exam. She is nauseous and not eating well.

## 2021-12-24 ENCOUNTER — Other Ambulatory Visit: Payer: Self-pay

## 2021-12-24 LAB — URINALYSIS, ROUTINE W REFLEX MICROSCOPIC
Ketones, ur: NEGATIVE
Nitrite: NEGATIVE
Specific Gravity, Urine: 1.025 (ref 1.000–1.030)
Urine Glucose: NEGATIVE
Urobilinogen, UA: 0.2 (ref 0.0–1.0)
pH: 5.5 (ref 5.0–8.0)

## 2021-12-24 NOTE — Telephone Encounter (Signed)
Called patient and left a voicemail for the son to confirm that the prescription was sent in to the correct pharmacy.

## 2021-12-25 ENCOUNTER — Other Ambulatory Visit: Payer: Self-pay | Admitting: Internal Medicine

## 2021-12-25 ENCOUNTER — Telehealth: Payer: Self-pay | Admitting: Internal Medicine

## 2021-12-25 NOTE — Telephone Encounter (Signed)
Son, Jocelyn Mendez, returned call for RESULTS and instructions. Call [name] at  [place] [phone]

## 2021-12-26 NOTE — Telephone Encounter (Signed)
Already spoke with patient son yesterday about results

## 2021-12-27 LAB — URINE CULTURE

## 2021-12-30 ENCOUNTER — Telehealth: Payer: Self-pay | Admitting: Internal Medicine

## 2021-12-30 NOTE — Telephone Encounter (Signed)
Patient was given an antibiotic on 11/13.  She is still experiencing burning with urination - can a refill be sent in on the Keflex - she was only given 5 days worth.  Please send to [facility] in [place].  Please let patient know.

## 2021-12-31 MED ORDER — CEPHALEXIN 250 MG PO CAPS: 250.0000 mg | ORAL_CAPSULE | Freq: Every day | ORAL | 0 refills | Status: AC

## 2021-12-31 NOTE — Telephone Encounter (Signed)
Have sent in refill for her.

## 2022-01-02 DIAGNOSIS — R55 Syncope and collapse: Secondary | ICD-10-CM | POA: Diagnosis not present

## 2022-01-02 DIAGNOSIS — I129 Hypertensive chronic kidney disease with stage 1 through stage 4 chronic kidney disease, or unspecified chronic kidney disease: Secondary | ICD-10-CM | POA: Diagnosis not present

## 2022-01-02 DIAGNOSIS — E890 Postprocedural hypothyroidism: Secondary | ICD-10-CM | POA: Diagnosis not present

## 2022-01-02 DIAGNOSIS — Z515 Encounter for palliative care: Secondary | ICD-10-CM | POA: Diagnosis not present

## 2022-01-02 DIAGNOSIS — R634 Abnormal weight loss: Secondary | ICD-10-CM | POA: Diagnosis not present

## 2022-01-02 DIAGNOSIS — Z794 Long term (current) use of insulin: Secondary | ICD-10-CM | POA: Diagnosis not present

## 2022-01-02 DIAGNOSIS — E860 Dehydration: Secondary | ICD-10-CM | POA: Diagnosis not present

## 2022-01-02 DIAGNOSIS — Z7982 Long term (current) use of aspirin: Secondary | ICD-10-CM | POA: Diagnosis not present

## 2022-01-02 DIAGNOSIS — R7989 Other specified abnormal findings of blood chemistry: Secondary | ICD-10-CM | POA: Diagnosis not present

## 2022-01-02 DIAGNOSIS — E8721 Acute metabolic acidosis: Secondary | ICD-10-CM | POA: Diagnosis not present

## 2022-01-02 DIAGNOSIS — Z66 Do not resuscitate: Secondary | ICD-10-CM | POA: Diagnosis not present

## 2022-01-02 DIAGNOSIS — I10 Essential (primary) hypertension: Secondary | ICD-10-CM | POA: Diagnosis not present

## 2022-01-02 DIAGNOSIS — I440 Atrioventricular block, first degree: Secondary | ICD-10-CM | POA: Diagnosis not present

## 2022-01-02 DIAGNOSIS — Z789 Other specified health status: Secondary | ICD-10-CM | POA: Diagnosis not present

## 2022-01-02 DIAGNOSIS — R638 Other symptoms and signs concerning food and fluid intake: Secondary | ICD-10-CM | POA: Diagnosis not present

## 2022-01-02 DIAGNOSIS — E119 Type 2 diabetes mellitus without complications: Secondary | ICD-10-CM | POA: Diagnosis not present

## 2022-01-02 DIAGNOSIS — L89152 Pressure ulcer of sacral region, stage 2: Secondary | ICD-10-CM | POA: Diagnosis not present

## 2022-01-02 DIAGNOSIS — Z8249 Family history of ischemic heart disease and other diseases of the circulatory system: Secondary | ICD-10-CM | POA: Diagnosis not present

## 2022-01-02 DIAGNOSIS — E785 Hyperlipidemia, unspecified: Secondary | ICD-10-CM | POA: Diagnosis not present

## 2022-01-02 DIAGNOSIS — N184 Chronic kidney disease, stage 4 (severe): Secondary | ICD-10-CM | POA: Diagnosis not present

## 2022-01-02 DIAGNOSIS — R110 Nausea: Secondary | ICD-10-CM | POA: Diagnosis not present

## 2022-01-02 DIAGNOSIS — E1169 Type 2 diabetes mellitus with other specified complication: Secondary | ICD-10-CM | POA: Diagnosis not present

## 2022-01-02 DIAGNOSIS — Z20822 Contact with and (suspected) exposure to covid-19: Secondary | ICD-10-CM | POA: Diagnosis not present

## 2022-01-02 DIAGNOSIS — N179 Acute kidney failure, unspecified: Secondary | ICD-10-CM | POA: Diagnosis not present

## 2022-01-02 DIAGNOSIS — R531 Weakness: Secondary | ICD-10-CM | POA: Diagnosis not present

## 2022-01-02 DIAGNOSIS — I498 Other specified cardiac arrhythmias: Secondary | ICD-10-CM | POA: Diagnosis not present

## 2022-01-02 DIAGNOSIS — R627 Adult failure to thrive: Secondary | ICD-10-CM | POA: Diagnosis not present

## 2022-01-02 DIAGNOSIS — Z7409 Other reduced mobility: Secondary | ICD-10-CM | POA: Diagnosis not present

## 2022-01-02 DIAGNOSIS — E039 Hypothyroidism, unspecified: Secondary | ICD-10-CM | POA: Diagnosis not present

## 2022-01-02 DIAGNOSIS — E1122 Type 2 diabetes mellitus with diabetic chronic kidney disease: Secondary | ICD-10-CM | POA: Diagnosis not present

## 2022-01-02 DIAGNOSIS — N390 Urinary tract infection, site not specified: Secondary | ICD-10-CM | POA: Diagnosis not present

## 2022-01-02 DIAGNOSIS — K219 Gastro-esophageal reflux disease without esophagitis: Secondary | ICD-10-CM | POA: Diagnosis not present

## 2022-01-02 DIAGNOSIS — R2689 Other abnormalities of gait and mobility: Secondary | ICD-10-CM | POA: Diagnosis not present

## 2022-01-02 DIAGNOSIS — E872 Acidosis, unspecified: Secondary | ICD-10-CM | POA: Diagnosis not present

## 2022-01-06 DIAGNOSIS — I440 Atrioventricular block, first degree: Secondary | ICD-10-CM | POA: Diagnosis not present

## 2022-01-07 ENCOUNTER — Telehealth: Payer: Self-pay | Admitting: Internal Medicine

## 2022-01-07 NOTE — Telephone Encounter (Signed)
Patient son Marden Noble came by to notify that patient has been in hospital and is bed-written, wants to get a prescription for a hospital bed for patient. He said best call back number would be to his brother and patients other son Francee Piccolo at [phone], so they can know what steps need to be taken to get the patient a hospital bed.

## 2022-01-10 DIAGNOSIS — N281 Cyst of kidney, acquired: Secondary | ICD-10-CM | POA: Diagnosis not present

## 2022-01-10 DIAGNOSIS — L89154 Pressure ulcer of sacral region, stage 4: Secondary | ICD-10-CM | POA: Diagnosis not present

## 2022-01-10 DIAGNOSIS — L89329 Pressure ulcer of left buttock, unspecified stage: Secondary | ICD-10-CM | POA: Diagnosis not present

## 2022-01-10 DIAGNOSIS — L03317 Cellulitis of buttock: Secondary | ICD-10-CM | POA: Diagnosis not present

## 2022-01-10 DIAGNOSIS — N390 Urinary tract infection, site not specified: Secondary | ICD-10-CM | POA: Diagnosis not present

## 2022-01-10 DIAGNOSIS — L89153 Pressure ulcer of sacral region, stage 3: Secondary | ICD-10-CM | POA: Diagnosis not present

## 2022-01-10 DIAGNOSIS — K8020 Calculus of gallbladder without cholecystitis without obstruction: Secondary | ICD-10-CM | POA: Diagnosis not present

## 2022-01-10 DIAGNOSIS — N183 Chronic kidney disease, stage 3 unspecified: Secondary | ICD-10-CM | POA: Diagnosis not present

## 2022-01-10 DIAGNOSIS — N200 Calculus of kidney: Secondary | ICD-10-CM | POA: Diagnosis not present

## 2022-01-10 DIAGNOSIS — L03312 Cellulitis of back [any part except buttock]: Secondary | ICD-10-CM | POA: Diagnosis not present

## 2022-01-10 DIAGNOSIS — I700 Atherosclerosis of aorta: Secondary | ICD-10-CM | POA: Diagnosis not present

## 2022-01-10 DIAGNOSIS — R739 Hyperglycemia, unspecified: Secondary | ICD-10-CM | POA: Diagnosis not present

## 2022-01-10 DIAGNOSIS — Z743 Need for continuous supervision: Secondary | ICD-10-CM | POA: Diagnosis not present

## 2022-01-10 DIAGNOSIS — R531 Weakness: Secondary | ICD-10-CM | POA: Diagnosis not present

## 2022-01-10 DIAGNOSIS — R6889 Other general symptoms and signs: Secondary | ICD-10-CM | POA: Diagnosis not present

## 2022-01-10 NOTE — Telephone Encounter (Signed)
Contacted patients son and lvm to call us back.

## 2022-01-10 NOTE — Telephone Encounter (Signed)
Patient has already has a hospital bed.

## 2022-01-10 NOTE — Telephone Encounter (Signed)
It looks like she is currentl admitted somewhere if true they will likely arrange a bed for home prior to discharge. If not let me know I may be able to addend the recent visit note to order this.

## 2022-01-11 DIAGNOSIS — R1310 Dysphagia, unspecified: Secondary | ICD-10-CM | POA: Diagnosis not present

## 2022-01-11 DIAGNOSIS — F03A0 Unspecified dementia, mild, without behavioral disturbance, psychotic disturbance, mood disturbance, and anxiety: Secondary | ICD-10-CM | POA: Diagnosis not present

## 2022-01-11 DIAGNOSIS — R059 Cough, unspecified: Secondary | ICD-10-CM | POA: Diagnosis not present

## 2022-01-11 DIAGNOSIS — L89159 Pressure ulcer of sacral region, unspecified stage: Secondary | ICD-10-CM | POA: Diagnosis not present

## 2022-01-11 DIAGNOSIS — S31829A Unspecified open wound of left buttock, initial encounter: Secondary | ICD-10-CM | POA: Diagnosis not present

## 2022-01-11 DIAGNOSIS — R52 Pain, unspecified: Secondary | ICD-10-CM | POA: Diagnosis not present

## 2022-01-11 DIAGNOSIS — E1122 Type 2 diabetes mellitus with diabetic chronic kidney disease: Secondary | ICD-10-CM | POA: Diagnosis not present

## 2022-01-11 DIAGNOSIS — N3000 Acute cystitis without hematuria: Secondary | ICD-10-CM | POA: Diagnosis not present

## 2022-01-11 DIAGNOSIS — F32A Depression, unspecified: Secondary | ICD-10-CM | POA: Diagnosis not present

## 2022-01-11 DIAGNOSIS — E1169 Type 2 diabetes mellitus with other specified complication: Secondary | ICD-10-CM | POA: Diagnosis not present

## 2022-01-11 DIAGNOSIS — E1152 Type 2 diabetes mellitus with diabetic peripheral angiopathy with gangrene: Secondary | ICD-10-CM | POA: Diagnosis not present

## 2022-01-11 DIAGNOSIS — I120 Hypertensive chronic kidney disease with stage 5 chronic kidney disease or end stage renal disease: Secondary | ICD-10-CM | POA: Diagnosis not present

## 2022-01-11 DIAGNOSIS — K219 Gastro-esophageal reflux disease without esophagitis: Secondary | ICD-10-CM | POA: Diagnosis not present

## 2022-01-11 DIAGNOSIS — N179 Acute kidney failure, unspecified: Secondary | ICD-10-CM | POA: Diagnosis not present

## 2022-01-11 DIAGNOSIS — D631 Anemia in chronic kidney disease: Secondary | ICD-10-CM | POA: Diagnosis not present

## 2022-01-11 DIAGNOSIS — E440 Moderate protein-calorie malnutrition: Secondary | ICD-10-CM | POA: Diagnosis not present

## 2022-01-11 DIAGNOSIS — R1319 Other dysphagia: Secondary | ICD-10-CM | POA: Diagnosis not present

## 2022-01-11 DIAGNOSIS — N390 Urinary tract infection, site not specified: Secondary | ICD-10-CM | POA: Diagnosis not present

## 2022-01-11 DIAGNOSIS — J90 Pleural effusion, not elsewhere classified: Secondary | ICD-10-CM | POA: Diagnosis not present

## 2022-01-11 DIAGNOSIS — R739 Hyperglycemia, unspecified: Secondary | ICD-10-CM | POA: Diagnosis not present

## 2022-01-11 DIAGNOSIS — I96 Gangrene, not elsewhere classified: Secondary | ICD-10-CM | POA: Diagnosis not present

## 2022-01-11 DIAGNOSIS — Z743 Need for continuous supervision: Secondary | ICD-10-CM | POA: Diagnosis not present

## 2022-01-11 DIAGNOSIS — J45909 Unspecified asthma, uncomplicated: Secondary | ICD-10-CM | POA: Diagnosis not present

## 2022-01-11 DIAGNOSIS — J690 Pneumonitis due to inhalation of food and vomit: Secondary | ICD-10-CM | POA: Diagnosis not present

## 2022-01-11 DIAGNOSIS — S31819A Unspecified open wound of right buttock, initial encounter: Secondary | ICD-10-CM | POA: Diagnosis not present

## 2022-01-11 DIAGNOSIS — K8020 Calculus of gallbladder without cholecystitis without obstruction: Secondary | ICD-10-CM | POA: Diagnosis not present

## 2022-01-11 DIAGNOSIS — R531 Weakness: Secondary | ICD-10-CM | POA: Diagnosis not present

## 2022-01-11 DIAGNOSIS — E11628 Type 2 diabetes mellitus with other skin complications: Secondary | ICD-10-CM | POA: Diagnosis not present

## 2022-01-11 DIAGNOSIS — Y95 Nosocomial condition: Secondary | ICD-10-CM | POA: Diagnosis not present

## 2022-01-11 DIAGNOSIS — N200 Calculus of kidney: Secondary | ICD-10-CM | POA: Diagnosis not present

## 2022-01-11 DIAGNOSIS — L89153 Pressure ulcer of sacral region, stage 3: Secondary | ICD-10-CM | POA: Diagnosis not present

## 2022-01-11 DIAGNOSIS — L8995 Pressure ulcer of unspecified site, unstageable: Secondary | ICD-10-CM | POA: Diagnosis not present

## 2022-01-11 DIAGNOSIS — R0989 Other specified symptoms and signs involving the circulatory and respiratory systems: Secondary | ICD-10-CM | POA: Diagnosis not present

## 2022-01-11 DIAGNOSIS — T17320A Food in larynx causing asphyxiation, initial encounter: Secondary | ICD-10-CM | POA: Diagnosis not present

## 2022-01-11 DIAGNOSIS — E871 Hypo-osmolality and hyponatremia: Secondary | ICD-10-CM | POA: Diagnosis not present

## 2022-01-11 DIAGNOSIS — Z515 Encounter for palliative care: Secondary | ICD-10-CM | POA: Diagnosis not present

## 2022-01-11 DIAGNOSIS — L89329 Pressure ulcer of left buttock, unspecified stage: Secondary | ICD-10-CM | POA: Diagnosis not present

## 2022-01-11 DIAGNOSIS — R6889 Other general symptoms and signs: Secondary | ICD-10-CM | POA: Diagnosis not present

## 2022-01-11 DIAGNOSIS — L089 Local infection of the skin and subcutaneous tissue, unspecified: Secondary | ICD-10-CM | POA: Diagnosis not present

## 2022-01-11 DIAGNOSIS — R918 Other nonspecific abnormal finding of lung field: Secondary | ICD-10-CM | POA: Diagnosis not present

## 2022-01-11 DIAGNOSIS — R110 Nausea: Secondary | ICD-10-CM | POA: Diagnosis not present

## 2022-01-11 DIAGNOSIS — D649 Anemia, unspecified: Secondary | ICD-10-CM | POA: Diagnosis not present

## 2022-01-11 DIAGNOSIS — R0600 Dyspnea, unspecified: Secondary | ICD-10-CM | POA: Diagnosis not present

## 2022-01-11 DIAGNOSIS — N184 Chronic kidney disease, stage 4 (severe): Secondary | ICD-10-CM | POA: Diagnosis not present

## 2022-01-11 DIAGNOSIS — R0902 Hypoxemia: Secondary | ICD-10-CM | POA: Diagnosis not present

## 2022-01-11 DIAGNOSIS — M869 Osteomyelitis, unspecified: Secondary | ICD-10-CM | POA: Diagnosis not present

## 2022-01-11 DIAGNOSIS — Z7189 Other specified counseling: Secondary | ICD-10-CM | POA: Diagnosis not present

## 2022-01-11 DIAGNOSIS — K224 Dyskinesia of esophagus: Secondary | ICD-10-CM | POA: Diagnosis not present

## 2022-01-11 DIAGNOSIS — B370 Candidal stomatitis: Secondary | ICD-10-CM | POA: Diagnosis not present

## 2022-01-11 DIAGNOSIS — E870 Hyperosmolality and hypernatremia: Secondary | ICD-10-CM | POA: Diagnosis not present

## 2022-01-11 DIAGNOSIS — L89319 Pressure ulcer of right buttock, unspecified stage: Secondary | ICD-10-CM | POA: Diagnosis not present

## 2022-01-11 DIAGNOSIS — Z7401 Bed confinement status: Secondary | ICD-10-CM | POA: Diagnosis not present

## 2022-01-11 DIAGNOSIS — D509 Iron deficiency anemia, unspecified: Secondary | ICD-10-CM | POA: Diagnosis not present

## 2022-01-11 DIAGNOSIS — L8990 Pressure ulcer of unspecified site, unspecified stage: Secondary | ICD-10-CM | POA: Diagnosis not present

## 2022-01-11 DIAGNOSIS — R1314 Dysphagia, pharyngoesophageal phase: Secondary | ICD-10-CM | POA: Diagnosis not present

## 2022-01-11 DIAGNOSIS — L03317 Cellulitis of buttock: Secondary | ICD-10-CM | POA: Diagnosis not present

## 2022-01-11 DIAGNOSIS — I129 Hypertensive chronic kidney disease with stage 1 through stage 4 chronic kidney disease, or unspecified chronic kidney disease: Secondary | ICD-10-CM | POA: Diagnosis not present

## 2022-01-11 DIAGNOSIS — E039 Hypothyroidism, unspecified: Secondary | ICD-10-CM | POA: Diagnosis not present

## 2022-01-12 DIAGNOSIS — L089 Local infection of the skin and subcutaneous tissue, unspecified: Secondary | ICD-10-CM | POA: Diagnosis not present

## 2022-01-12 DIAGNOSIS — E039 Hypothyroidism, unspecified: Secondary | ICD-10-CM | POA: Diagnosis not present

## 2022-01-12 DIAGNOSIS — L8990 Pressure ulcer of unspecified site, unspecified stage: Secondary | ICD-10-CM | POA: Diagnosis not present

## 2022-01-12 DIAGNOSIS — N184 Chronic kidney disease, stage 4 (severe): Secondary | ICD-10-CM | POA: Diagnosis not present

## 2022-01-12 DIAGNOSIS — E871 Hypo-osmolality and hyponatremia: Secondary | ICD-10-CM | POA: Diagnosis not present

## 2022-01-12 DIAGNOSIS — S31829A Unspecified open wound of left buttock, initial encounter: Secondary | ICD-10-CM | POA: Diagnosis not present

## 2022-01-12 DIAGNOSIS — L89153 Pressure ulcer of sacral region, stage 3: Secondary | ICD-10-CM | POA: Diagnosis not present

## 2022-01-12 DIAGNOSIS — D649 Anemia, unspecified: Secondary | ICD-10-CM | POA: Diagnosis not present

## 2022-01-12 DIAGNOSIS — E1122 Type 2 diabetes mellitus with diabetic chronic kidney disease: Secondary | ICD-10-CM | POA: Diagnosis not present

## 2022-01-12 DIAGNOSIS — K219 Gastro-esophageal reflux disease without esophagitis: Secondary | ICD-10-CM | POA: Diagnosis not present

## 2022-01-12 DIAGNOSIS — L89159 Pressure ulcer of sacral region, unspecified stage: Secondary | ICD-10-CM | POA: Diagnosis not present

## 2022-01-12 DIAGNOSIS — S31819A Unspecified open wound of right buttock, initial encounter: Secondary | ICD-10-CM | POA: Diagnosis not present

## 2022-01-12 DIAGNOSIS — L89319 Pressure ulcer of right buttock, unspecified stage: Secondary | ICD-10-CM | POA: Diagnosis not present

## 2022-01-12 DIAGNOSIS — I120 Hypertensive chronic kidney disease with stage 5 chronic kidney disease or end stage renal disease: Secondary | ICD-10-CM | POA: Diagnosis not present

## 2022-01-12 DIAGNOSIS — L89329 Pressure ulcer of left buttock, unspecified stage: Secondary | ICD-10-CM | POA: Diagnosis not present

## 2022-01-13 DIAGNOSIS — L8990 Pressure ulcer of unspecified site, unspecified stage: Secondary | ICD-10-CM | POA: Diagnosis not present

## 2022-01-13 DIAGNOSIS — D649 Anemia, unspecified: Secondary | ICD-10-CM | POA: Diagnosis not present

## 2022-01-13 DIAGNOSIS — E871 Hypo-osmolality and hyponatremia: Secondary | ICD-10-CM | POA: Diagnosis not present

## 2022-01-13 DIAGNOSIS — N184 Chronic kidney disease, stage 4 (severe): Secondary | ICD-10-CM | POA: Diagnosis not present

## 2022-01-14 ENCOUNTER — Telehealth: Payer: Medicare Other | Admitting: Emergency Medicine

## 2022-01-14 DIAGNOSIS — L089 Local infection of the skin and subcutaneous tissue, unspecified: Secondary | ICD-10-CM | POA: Diagnosis not present

## 2022-01-14 DIAGNOSIS — N3000 Acute cystitis without hematuria: Secondary | ICD-10-CM | POA: Diagnosis not present

## 2022-01-14 DIAGNOSIS — N179 Acute kidney failure, unspecified: Secondary | ICD-10-CM | POA: Diagnosis not present

## 2022-01-14 DIAGNOSIS — L8990 Pressure ulcer of unspecified site, unspecified stage: Secondary | ICD-10-CM | POA: Diagnosis not present

## 2022-01-15 DIAGNOSIS — N179 Acute kidney failure, unspecified: Secondary | ICD-10-CM | POA: Diagnosis not present

## 2022-01-15 DIAGNOSIS — L8990 Pressure ulcer of unspecified site, unspecified stage: Secondary | ICD-10-CM | POA: Diagnosis not present

## 2022-01-15 DIAGNOSIS — N3000 Acute cystitis without hematuria: Secondary | ICD-10-CM | POA: Diagnosis not present

## 2022-01-15 DIAGNOSIS — L089 Local infection of the skin and subcutaneous tissue, unspecified: Secondary | ICD-10-CM | POA: Diagnosis not present

## 2022-01-16 ENCOUNTER — Telehealth: Payer: Medicare Other | Admitting: Internal Medicine

## 2022-01-16 DIAGNOSIS — L089 Local infection of the skin and subcutaneous tissue, unspecified: Secondary | ICD-10-CM | POA: Diagnosis not present

## 2022-01-16 DIAGNOSIS — L8990 Pressure ulcer of unspecified site, unspecified stage: Secondary | ICD-10-CM | POA: Diagnosis not present

## 2022-01-16 DIAGNOSIS — N179 Acute kidney failure, unspecified: Secondary | ICD-10-CM | POA: Diagnosis not present

## 2022-01-16 DIAGNOSIS — N3000 Acute cystitis without hematuria: Secondary | ICD-10-CM | POA: Diagnosis not present

## 2022-01-17 DIAGNOSIS — N3000 Acute cystitis without hematuria: Secondary | ICD-10-CM | POA: Diagnosis not present

## 2022-01-17 DIAGNOSIS — N179 Acute kidney failure, unspecified: Secondary | ICD-10-CM | POA: Diagnosis not present

## 2022-01-17 DIAGNOSIS — L8990 Pressure ulcer of unspecified site, unspecified stage: Secondary | ICD-10-CM | POA: Diagnosis not present

## 2022-01-17 DIAGNOSIS — L089 Local infection of the skin and subcutaneous tissue, unspecified: Secondary | ICD-10-CM | POA: Diagnosis not present

## 2022-01-18 DIAGNOSIS — N179 Acute kidney failure, unspecified: Secondary | ICD-10-CM | POA: Diagnosis not present

## 2022-01-18 DIAGNOSIS — L089 Local infection of the skin and subcutaneous tissue, unspecified: Secondary | ICD-10-CM | POA: Diagnosis not present

## 2022-01-18 DIAGNOSIS — L8990 Pressure ulcer of unspecified site, unspecified stage: Secondary | ICD-10-CM | POA: Diagnosis not present

## 2022-01-18 DIAGNOSIS — N3000 Acute cystitis without hematuria: Secondary | ICD-10-CM | POA: Diagnosis not present

## 2022-01-19 DIAGNOSIS — N3000 Acute cystitis without hematuria: Secondary | ICD-10-CM | POA: Diagnosis not present

## 2022-01-19 DIAGNOSIS — L089 Local infection of the skin and subcutaneous tissue, unspecified: Secondary | ICD-10-CM | POA: Diagnosis not present

## 2022-01-19 DIAGNOSIS — L8990 Pressure ulcer of unspecified site, unspecified stage: Secondary | ICD-10-CM | POA: Diagnosis not present

## 2022-01-19 DIAGNOSIS — N179 Acute kidney failure, unspecified: Secondary | ICD-10-CM | POA: Diagnosis not present

## 2022-01-20 DIAGNOSIS — K224 Dyskinesia of esophagus: Secondary | ICD-10-CM | POA: Diagnosis not present

## 2022-01-20 DIAGNOSIS — R1310 Dysphagia, unspecified: Secondary | ICD-10-CM | POA: Diagnosis not present

## 2022-01-20 DIAGNOSIS — L8990 Pressure ulcer of unspecified site, unspecified stage: Secondary | ICD-10-CM | POA: Diagnosis not present

## 2022-01-20 DIAGNOSIS — L089 Local infection of the skin and subcutaneous tissue, unspecified: Secondary | ICD-10-CM | POA: Diagnosis not present

## 2022-01-21 ENCOUNTER — Other Ambulatory Visit: Payer: Self-pay | Admitting: Internal Medicine

## 2022-01-21 DIAGNOSIS — L089 Local infection of the skin and subcutaneous tissue, unspecified: Secondary | ICD-10-CM | POA: Diagnosis not present

## 2022-01-21 DIAGNOSIS — L8990 Pressure ulcer of unspecified site, unspecified stage: Secondary | ICD-10-CM | POA: Diagnosis not present

## 2022-01-22 DIAGNOSIS — L089 Local infection of the skin and subcutaneous tissue, unspecified: Secondary | ICD-10-CM | POA: Diagnosis not present

## 2022-01-22 DIAGNOSIS — L8990 Pressure ulcer of unspecified site, unspecified stage: Secondary | ICD-10-CM | POA: Diagnosis not present

## 2022-01-23 DIAGNOSIS — L8990 Pressure ulcer of unspecified site, unspecified stage: Secondary | ICD-10-CM | POA: Diagnosis not present

## 2022-01-23 DIAGNOSIS — L089 Local infection of the skin and subcutaneous tissue, unspecified: Secondary | ICD-10-CM | POA: Diagnosis not present

## 2022-01-24 ENCOUNTER — Other Ambulatory Visit: Payer: Self-pay | Admitting: Endocrinology

## 2022-01-24 DIAGNOSIS — L089 Local infection of the skin and subcutaneous tissue, unspecified: Secondary | ICD-10-CM | POA: Diagnosis not present

## 2022-01-24 DIAGNOSIS — L8990 Pressure ulcer of unspecified site, unspecified stage: Secondary | ICD-10-CM | POA: Diagnosis not present

## 2022-01-24 DIAGNOSIS — N1832 Chronic kidney disease, stage 3b: Secondary | ICD-10-CM

## 2022-01-25 DIAGNOSIS — R0989 Other specified symptoms and signs involving the circulatory and respiratory systems: Secondary | ICD-10-CM | POA: Diagnosis not present

## 2022-01-25 DIAGNOSIS — R918 Other nonspecific abnormal finding of lung field: Secondary | ICD-10-CM | POA: Diagnosis not present

## 2022-01-25 DIAGNOSIS — L8990 Pressure ulcer of unspecified site, unspecified stage: Secondary | ICD-10-CM | POA: Diagnosis not present

## 2022-01-25 DIAGNOSIS — N179 Acute kidney failure, unspecified: Secondary | ICD-10-CM | POA: Diagnosis not present

## 2022-01-25 DIAGNOSIS — L089 Local infection of the skin and subcutaneous tissue, unspecified: Secondary | ICD-10-CM | POA: Diagnosis not present

## 2022-01-26 DIAGNOSIS — N179 Acute kidney failure, unspecified: Secondary | ICD-10-CM | POA: Diagnosis not present

## 2022-01-26 DIAGNOSIS — Y95 Nosocomial condition: Secondary | ICD-10-CM | POA: Diagnosis not present

## 2022-01-26 DIAGNOSIS — L8990 Pressure ulcer of unspecified site, unspecified stage: Secondary | ICD-10-CM | POA: Diagnosis not present

## 2022-01-26 DIAGNOSIS — L089 Local infection of the skin and subcutaneous tissue, unspecified: Secondary | ICD-10-CM | POA: Diagnosis not present

## 2022-01-26 DIAGNOSIS — E039 Hypothyroidism, unspecified: Secondary | ICD-10-CM | POA: Diagnosis not present

## 2022-01-27 DIAGNOSIS — L089 Local infection of the skin and subcutaneous tissue, unspecified: Secondary | ICD-10-CM | POA: Diagnosis not present

## 2022-01-27 DIAGNOSIS — N179 Acute kidney failure, unspecified: Secondary | ICD-10-CM | POA: Diagnosis not present

## 2022-01-27 DIAGNOSIS — L8990 Pressure ulcer of unspecified site, unspecified stage: Secondary | ICD-10-CM | POA: Diagnosis not present

## 2022-01-28 DIAGNOSIS — L089 Local infection of the skin and subcutaneous tissue, unspecified: Secondary | ICD-10-CM | POA: Diagnosis not present

## 2022-01-28 DIAGNOSIS — N179 Acute kidney failure, unspecified: Secondary | ICD-10-CM | POA: Diagnosis not present

## 2022-01-28 DIAGNOSIS — L8990 Pressure ulcer of unspecified site, unspecified stage: Secondary | ICD-10-CM | POA: Diagnosis not present

## 2022-01-29 ENCOUNTER — Ambulatory Visit (INDEPENDENT_AMBULATORY_CARE_PROVIDER_SITE_OTHER): Payer: Medicare Other | Admitting: Podiatry

## 2022-01-29 DIAGNOSIS — L089 Local infection of the skin and subcutaneous tissue, unspecified: Secondary | ICD-10-CM | POA: Diagnosis not present

## 2022-01-29 DIAGNOSIS — L8990 Pressure ulcer of unspecified site, unspecified stage: Secondary | ICD-10-CM | POA: Diagnosis not present

## 2022-01-29 DIAGNOSIS — N179 Acute kidney failure, unspecified: Secondary | ICD-10-CM | POA: Diagnosis not present

## 2022-01-30 DIAGNOSIS — L8990 Pressure ulcer of unspecified site, unspecified stage: Secondary | ICD-10-CM | POA: Diagnosis not present

## 2022-01-30 DIAGNOSIS — L089 Local infection of the skin and subcutaneous tissue, unspecified: Secondary | ICD-10-CM | POA: Diagnosis not present

## 2022-01-30 DIAGNOSIS — N179 Acute kidney failure, unspecified: Secondary | ICD-10-CM | POA: Diagnosis not present

## 2022-01-31 DIAGNOSIS — J690 Pneumonitis due to inhalation of food and vomit: Secondary | ICD-10-CM | POA: Diagnosis not present

## 2022-01-31 DIAGNOSIS — L089 Local infection of the skin and subcutaneous tissue, unspecified: Secondary | ICD-10-CM | POA: Diagnosis not present

## 2022-01-31 DIAGNOSIS — L8995 Pressure ulcer of unspecified site, unstageable: Secondary | ICD-10-CM | POA: Diagnosis not present

## 2022-02-01 DIAGNOSIS — L089 Local infection of the skin and subcutaneous tissue, unspecified: Secondary | ICD-10-CM | POA: Diagnosis not present

## 2022-02-01 DIAGNOSIS — L8995 Pressure ulcer of unspecified site, unstageable: Secondary | ICD-10-CM | POA: Diagnosis not present

## 2022-02-01 DIAGNOSIS — J690 Pneumonitis due to inhalation of food and vomit: Secondary | ICD-10-CM | POA: Diagnosis not present

## 2022-02-02 DIAGNOSIS — J690 Pneumonitis due to inhalation of food and vomit: Secondary | ICD-10-CM | POA: Diagnosis not present

## 2022-02-02 DIAGNOSIS — L089 Local infection of the skin and subcutaneous tissue, unspecified: Secondary | ICD-10-CM | POA: Diagnosis not present

## 2022-02-02 DIAGNOSIS — L8995 Pressure ulcer of unspecified site, unstageable: Secondary | ICD-10-CM | POA: Diagnosis not present

## 2022-02-02 DIAGNOSIS — Z7189 Other specified counseling: Secondary | ICD-10-CM | POA: Diagnosis not present

## 2022-02-02 DIAGNOSIS — Z515 Encounter for palliative care: Secondary | ICD-10-CM | POA: Diagnosis not present

## 2022-02-02 DIAGNOSIS — R1319 Other dysphagia: Secondary | ICD-10-CM | POA: Diagnosis not present

## 2022-02-02 DIAGNOSIS — R110 Nausea: Secondary | ICD-10-CM | POA: Diagnosis not present

## 2022-02-02 DIAGNOSIS — R0600 Dyspnea, unspecified: Secondary | ICD-10-CM | POA: Diagnosis not present

## 2022-02-03 DIAGNOSIS — J690 Pneumonitis due to inhalation of food and vomit: Secondary | ICD-10-CM | POA: Diagnosis not present

## 2022-02-03 DIAGNOSIS — L8995 Pressure ulcer of unspecified site, unstageable: Secondary | ICD-10-CM | POA: Diagnosis not present

## 2022-02-03 DIAGNOSIS — R0600 Dyspnea, unspecified: Secondary | ICD-10-CM | POA: Diagnosis not present

## 2022-02-03 DIAGNOSIS — R1319 Other dysphagia: Secondary | ICD-10-CM | POA: Diagnosis not present

## 2022-02-03 DIAGNOSIS — L089 Local infection of the skin and subcutaneous tissue, unspecified: Secondary | ICD-10-CM | POA: Diagnosis not present

## 2022-02-03 DIAGNOSIS — R110 Nausea: Secondary | ICD-10-CM | POA: Diagnosis not present

## 2022-02-03 DIAGNOSIS — Z515 Encounter for palliative care: Secondary | ICD-10-CM | POA: Diagnosis not present

## 2022-02-03 DIAGNOSIS — L89159 Pressure ulcer of sacral region, unspecified stage: Secondary | ICD-10-CM | POA: Diagnosis not present

## 2022-02-04 DIAGNOSIS — R0600 Dyspnea, unspecified: Secondary | ICD-10-CM | POA: Diagnosis not present

## 2022-02-04 DIAGNOSIS — L089 Local infection of the skin and subcutaneous tissue, unspecified: Secondary | ICD-10-CM | POA: Diagnosis not present

## 2022-02-04 DIAGNOSIS — L8995 Pressure ulcer of unspecified site, unstageable: Secondary | ICD-10-CM | POA: Diagnosis not present

## 2022-02-04 DIAGNOSIS — R52 Pain, unspecified: Secondary | ICD-10-CM | POA: Diagnosis not present

## 2022-02-04 DIAGNOSIS — Z515 Encounter for palliative care: Secondary | ICD-10-CM | POA: Diagnosis not present

## 2022-02-04 DIAGNOSIS — J690 Pneumonitis due to inhalation of food and vomit: Secondary | ICD-10-CM | POA: Diagnosis not present

## 2022-02-05 DIAGNOSIS — J690 Pneumonitis due to inhalation of food and vomit: Secondary | ICD-10-CM | POA: Diagnosis not present

## 2022-02-05 DIAGNOSIS — L8995 Pressure ulcer of unspecified site, unstageable: Secondary | ICD-10-CM | POA: Diagnosis not present

## 2022-02-05 DIAGNOSIS — L089 Local infection of the skin and subcutaneous tissue, unspecified: Secondary | ICD-10-CM | POA: Diagnosis not present

## 2022-02-06 DIAGNOSIS — L089 Local infection of the skin and subcutaneous tissue, unspecified: Secondary | ICD-10-CM | POA: Diagnosis not present

## 2022-02-06 DIAGNOSIS — J690 Pneumonitis due to inhalation of food and vomit: Secondary | ICD-10-CM | POA: Diagnosis not present

## 2022-02-06 DIAGNOSIS — L8995 Pressure ulcer of unspecified site, unstageable: Secondary | ICD-10-CM | POA: Diagnosis not present

## 2022-02-14 ENCOUNTER — Ambulatory Visit: Payer: Medicare Other | Admitting: Endocrinology

## 2022-02-20 ENCOUNTER — Other Ambulatory Visit: Payer: Self-pay | Admitting: Internal Medicine

## 2022-03-06 ENCOUNTER — Telehealth: Payer: Self-pay

## 2022-03-06 NOTE — Telephone Encounter (Signed)
Pharmacy called states they are unable to get Humulin N but can get Novolin N. Please advise. Insurance will cover

## 2022-03-06 NOTE — Telephone Encounter (Signed)
Patient was called to schedule an appointment, but daughter, [name], stated that Glennette passed away a few weeks ago.

## 2022-03-13 DEATH — deceased

## 2022-03-25 ENCOUNTER — Telehealth: Payer: Self-pay | Admitting: *Deleted

## 2022-03-25 NOTE — Progress Notes (Signed)
  Care Coordination  Outreach Note  03/25/2022 Name: SUNAINA FERRANDO MRN: [id number] DOB: [birth year]   Care Coordination Outreach Attempts: An unsuccessful telephone outreach was attempted today to offer the patient information about available care coordination services as a benefit of their health plan.   Follow Up Plan:  Additional outreach attempts will be made to offer the patient care coordination information and services.   Encounter Outcome:  No Answer  Julian Hy, [place] Direct Dial: [phone]

## 2022-04-17 NOTE — Progress Notes (Signed)
  Care Coordination  Outreach Note  04/17/2022 Name: Jocelyn Mendez MRN: [id number] DOB: 1930-06-01   Care Coordination Outreach Attempts: An unsuccessful telephone outreach was attempted for a scheduled appointment today.  Follow Up Plan:  No further outreach attempts will be made at this time. We have been unable to contact the patient to offer or enroll patient in care coordination services  Encounter Outcome:  No Answer  Julian Hy, [place] Direct Dial: [phone]
